# Patient Record
Sex: Female | Born: 1935 | Race: White | Hispanic: No | Marital: Married | Smoking: Former smoker
Health system: Southern US, Community
[De-identification: ages and names within clinical notes are randomized; demographics above are authoritative.]

## PROBLEM LIST (undated history)

## (undated) ENCOUNTER — Emergency Department (HOSPITAL_BASED_OUTPATIENT_CLINIC_OR_DEPARTMENT_OTHER): Admission: EM | Payer: Medicare Other | Source: Home / Self Care

## (undated) DIAGNOSIS — H269 Unspecified cataract: Secondary | ICD-10-CM

## (undated) DIAGNOSIS — E7800 Pure hypercholesterolemia, unspecified: Secondary | ICD-10-CM

## (undated) DIAGNOSIS — C50919 Malignant neoplasm of unspecified site of unspecified female breast: Secondary | ICD-10-CM

## (undated) DIAGNOSIS — M8580 Other specified disorders of bone density and structure, unspecified site: Secondary | ICD-10-CM

## (undated) DIAGNOSIS — D649 Anemia, unspecified: Secondary | ICD-10-CM

## (undated) DIAGNOSIS — K449 Diaphragmatic hernia without obstruction or gangrene: Secondary | ICD-10-CM

## (undated) DIAGNOSIS — E162 Hypoglycemia, unspecified: Secondary | ICD-10-CM

## (undated) DIAGNOSIS — T7840XA Allergy, unspecified, initial encounter: Secondary | ICD-10-CM

## (undated) DIAGNOSIS — R202 Paresthesia of skin: Secondary | ICD-10-CM

## (undated) DIAGNOSIS — F419 Anxiety disorder, unspecified: Secondary | ICD-10-CM

## (undated) DIAGNOSIS — I350 Nonrheumatic aortic (valve) stenosis: Secondary | ICD-10-CM

## (undated) DIAGNOSIS — IMO0002 Reserved for concepts with insufficient information to code with codable children: Secondary | ICD-10-CM

## (undated) DIAGNOSIS — Z923 Personal history of irradiation: Secondary | ICD-10-CM

## (undated) DIAGNOSIS — M1990 Unspecified osteoarthritis, unspecified site: Secondary | ICD-10-CM

## (undated) DIAGNOSIS — K5900 Constipation, unspecified: Secondary | ICD-10-CM

## (undated) DIAGNOSIS — I10 Essential (primary) hypertension: Secondary | ICD-10-CM

## (undated) DIAGNOSIS — I499 Cardiac arrhythmia, unspecified: Secondary | ICD-10-CM

## (undated) HISTORY — PX: BREAST SURGERY: SHX581

## (undated) HISTORY — PX: COSMETIC SURGERY: SHX468

## (undated) HISTORY — DX: Pure hypercholesterolemia, unspecified: E78.00

## (undated) HISTORY — DX: Anxiety disorder, unspecified: F41.9

## (undated) HISTORY — PX: TONSILLECTOMY: SUR1361

## (undated) HISTORY — PX: BREAST LUMPECTOMY: SHX2

## (undated) HISTORY — DX: Anemia, unspecified: D64.9

## (undated) HISTORY — DX: Paresthesia of skin: R20.2

## (undated) HISTORY — PX: PARATHYROIDECTOMY: SHX19

## (undated) HISTORY — DX: Allergy, unspecified, initial encounter: T78.40XA

## (undated) HISTORY — PX: BREAST BIOPSY: SHX20

## (undated) HISTORY — DX: Diaphragmatic hernia without obstruction or gangrene: K44.9

## (undated) HISTORY — DX: Other specified disorders of bone density and structure, unspecified site: M85.80

## (undated) HISTORY — DX: Unspecified cataract: H26.9

## (undated) HISTORY — DX: Nonrheumatic aortic (valve) stenosis: I35.0

## (undated) HISTORY — DX: Malignant neoplasm of unspecified site of unspecified female breast: C50.919

## (undated) HISTORY — DX: Reserved for concepts with insufficient information to code with codable children: IMO0002

---
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
## 1997-12-20 ENCOUNTER — Ambulatory Visit (HOSPITAL_COMMUNITY): Admission: RE | Admit: 1997-12-20 | Discharge: 1997-12-20 | Payer: Self-pay | Admitting: Obstetrics and Gynecology

## 1997-12-27 ENCOUNTER — Other Ambulatory Visit: Admission: RE | Admit: 1997-12-27 | Discharge: 1997-12-27 | Payer: Self-pay | Admitting: Obstetrics and Gynecology

## 1998-12-27 ENCOUNTER — Ambulatory Visit (HOSPITAL_COMMUNITY): Admission: RE | Admit: 1998-12-27 | Discharge: 1998-12-27 | Payer: Self-pay | Admitting: Gastroenterology

## 1998-12-27 ENCOUNTER — Encounter (INDEPENDENT_AMBULATORY_CARE_PROVIDER_SITE_OTHER): Payer: Self-pay | Admitting: Specialist

## 1999-01-02 ENCOUNTER — Ambulatory Visit (HOSPITAL_COMMUNITY): Admission: RE | Admit: 1999-01-02 | Discharge: 1999-01-02 | Payer: Self-pay | Admitting: Obstetrics and Gynecology

## 1999-01-02 ENCOUNTER — Encounter: Payer: Self-pay | Admitting: Obstetrics and Gynecology

## 1999-01-03 ENCOUNTER — Ambulatory Visit (HOSPITAL_COMMUNITY): Admission: RE | Admit: 1999-01-03 | Discharge: 1999-01-03 | Payer: Self-pay | Admitting: Obstetrics and Gynecology

## 1999-01-03 ENCOUNTER — Encounter: Payer: Self-pay | Admitting: Obstetrics and Gynecology

## 1999-01-22 ENCOUNTER — Other Ambulatory Visit: Admission: RE | Admit: 1999-01-22 | Discharge: 1999-01-22 | Payer: Self-pay | Admitting: Obstetrics and Gynecology

## 2000-01-21 ENCOUNTER — Encounter: Payer: Self-pay | Admitting: Obstetrics and Gynecology

## 2000-01-21 ENCOUNTER — Ambulatory Visit (HOSPITAL_COMMUNITY): Admission: RE | Admit: 2000-01-21 | Discharge: 2000-01-21 | Payer: Self-pay | Admitting: Obstetrics and Gynecology

## 2000-02-04 ENCOUNTER — Other Ambulatory Visit: Admission: RE | Admit: 2000-02-04 | Discharge: 2000-02-04 | Payer: Self-pay | Admitting: Neurosurgery

## 2000-10-02 ENCOUNTER — Encounter (INDEPENDENT_AMBULATORY_CARE_PROVIDER_SITE_OTHER): Payer: Self-pay | Admitting: Specialist

## 2000-10-02 ENCOUNTER — Other Ambulatory Visit: Admission: RE | Admit: 2000-10-02 | Discharge: 2000-10-02 | Payer: Self-pay | Admitting: Obstetrics and Gynecology

## 2001-01-29 ENCOUNTER — Encounter: Admission: RE | Admit: 2001-01-29 | Discharge: 2001-01-29 | Payer: Self-pay | Admitting: Obstetrics and Gynecology

## 2001-01-29 ENCOUNTER — Encounter: Payer: Self-pay | Admitting: Obstetrics and Gynecology

## 2001-09-06 ENCOUNTER — Other Ambulatory Visit: Admission: RE | Admit: 2001-09-06 | Discharge: 2001-09-06 | Payer: Self-pay | Admitting: Obstetrics and Gynecology

## 2002-02-02 ENCOUNTER — Encounter: Admission: RE | Admit: 2002-02-02 | Discharge: 2002-02-02 | Payer: Self-pay | Admitting: Obstetrics and Gynecology

## 2002-02-02 ENCOUNTER — Encounter: Payer: Self-pay | Admitting: Obstetrics and Gynecology

## 2002-11-23 ENCOUNTER — Other Ambulatory Visit: Admission: RE | Admit: 2002-11-23 | Discharge: 2002-11-23 | Payer: Self-pay | Admitting: Obstetrics and Gynecology

## 2003-02-07 ENCOUNTER — Encounter: Admission: RE | Admit: 2003-02-07 | Discharge: 2003-02-07 | Payer: Self-pay | Admitting: Obstetrics and Gynecology

## 2003-02-07 ENCOUNTER — Encounter: Payer: Self-pay | Admitting: Obstetrics and Gynecology

## 2004-02-13 ENCOUNTER — Encounter: Admission: RE | Admit: 2004-02-13 | Discharge: 2004-02-13 | Payer: Self-pay | Admitting: Obstetrics and Gynecology

## 2004-02-16 ENCOUNTER — Encounter (INDEPENDENT_AMBULATORY_CARE_PROVIDER_SITE_OTHER): Payer: Self-pay | Admitting: *Deleted

## 2004-02-16 ENCOUNTER — Encounter (INDEPENDENT_AMBULATORY_CARE_PROVIDER_SITE_OTHER): Payer: Self-pay | Admitting: Diagnostic Radiology

## 2004-02-16 ENCOUNTER — Encounter: Admission: RE | Admit: 2004-02-16 | Discharge: 2004-02-16 | Payer: Self-pay | Admitting: Obstetrics and Gynecology

## 2004-02-23 ENCOUNTER — Encounter (HOSPITAL_COMMUNITY): Admission: RE | Admit: 2004-02-23 | Discharge: 2004-05-23 | Payer: Self-pay | Admitting: General Surgery

## 2004-02-29 ENCOUNTER — Ambulatory Visit: Payer: Self-pay | Admitting: Internal Medicine

## 2004-02-29 ENCOUNTER — Encounter: Admission: RE | Admit: 2004-02-29 | Discharge: 2004-02-29 | Payer: Self-pay | Admitting: General Surgery

## 2004-03-04 ENCOUNTER — Encounter: Admission: RE | Admit: 2004-03-04 | Discharge: 2004-03-04 | Payer: Self-pay | Admitting: General Surgery

## 2004-03-05 ENCOUNTER — Ambulatory Visit (HOSPITAL_BASED_OUTPATIENT_CLINIC_OR_DEPARTMENT_OTHER): Admission: RE | Admit: 2004-03-05 | Discharge: 2004-03-05 | Payer: Self-pay | Admitting: General Surgery

## 2004-03-05 ENCOUNTER — Encounter (INDEPENDENT_AMBULATORY_CARE_PROVIDER_SITE_OTHER): Payer: Self-pay | Admitting: *Deleted

## 2004-03-05 ENCOUNTER — Ambulatory Visit (HOSPITAL_COMMUNITY): Admission: RE | Admit: 2004-03-05 | Discharge: 2004-03-05 | Payer: Self-pay | Admitting: General Surgery

## 2004-03-05 ENCOUNTER — Encounter: Admission: RE | Admit: 2004-03-05 | Discharge: 2004-03-05 | Payer: Self-pay | Admitting: General Surgery

## 2004-03-07 ENCOUNTER — Ambulatory Visit: Payer: Self-pay | Admitting: Oncology

## 2004-03-22 ENCOUNTER — Ambulatory Visit (HOSPITAL_COMMUNITY): Admission: RE | Admit: 2004-03-22 | Discharge: 2004-03-22 | Payer: Self-pay | Admitting: Oncology

## 2004-09-04 ENCOUNTER — Encounter: Admission: RE | Admit: 2004-09-04 | Discharge: 2004-09-04 | Payer: Self-pay | Admitting: Internal Medicine

## 2004-09-05 ENCOUNTER — Encounter: Admission: RE | Admit: 2004-09-05 | Discharge: 2004-09-05 | Payer: Self-pay | Admitting: Oncology

## 2004-09-12 ENCOUNTER — Ambulatory Visit: Payer: Self-pay | Admitting: Oncology

## 2004-11-12 ENCOUNTER — Ambulatory Visit: Payer: Self-pay | Admitting: Internal Medicine

## 2004-11-18 ENCOUNTER — Ambulatory Visit: Payer: Self-pay | Admitting: Internal Medicine

## 2004-11-21 ENCOUNTER — Ambulatory Visit (HOSPITAL_COMMUNITY): Admission: RE | Admit: 2004-11-21 | Discharge: 2004-11-21 | Payer: Self-pay | Admitting: Internal Medicine

## 2005-01-09 ENCOUNTER — Ambulatory Visit: Payer: Self-pay | Admitting: Internal Medicine

## 2005-01-23 ENCOUNTER — Ambulatory Visit: Payer: Self-pay | Admitting: Internal Medicine

## 2005-01-29 ENCOUNTER — Ambulatory Visit: Payer: Self-pay | Admitting: Internal Medicine

## 2005-02-04 ENCOUNTER — Other Ambulatory Visit: Admission: RE | Admit: 2005-02-04 | Discharge: 2005-02-04 | Payer: Self-pay | Admitting: Obstetrics and Gynecology

## 2005-02-13 ENCOUNTER — Encounter: Admission: RE | Admit: 2005-02-13 | Discharge: 2005-02-13 | Payer: Self-pay | Admitting: Oncology

## 2005-02-18 ENCOUNTER — Encounter (INDEPENDENT_AMBULATORY_CARE_PROVIDER_SITE_OTHER): Payer: Self-pay | Admitting: *Deleted

## 2005-02-18 ENCOUNTER — Ambulatory Visit (HOSPITAL_COMMUNITY): Admission: RE | Admit: 2005-02-18 | Discharge: 2005-02-19 | Payer: Self-pay | Admitting: Surgery

## 2005-02-21 ENCOUNTER — Ambulatory Visit: Payer: Self-pay | Admitting: Internal Medicine

## 2005-02-27 ENCOUNTER — Ambulatory Visit: Payer: Self-pay | Admitting: Internal Medicine

## 2005-04-28 DIAGNOSIS — C50919 Malignant neoplasm of unspecified site of unspecified female breast: Secondary | ICD-10-CM

## 2005-04-28 HISTORY — DX: Malignant neoplasm of unspecified site of unspecified female breast: C50.919

## 2005-05-09 ENCOUNTER — Ambulatory Visit: Payer: Self-pay | Admitting: Internal Medicine

## 2005-09-01 ENCOUNTER — Ambulatory Visit: Payer: Self-pay | Admitting: Internal Medicine

## 2005-10-06 ENCOUNTER — Ambulatory Visit: Payer: Self-pay | Admitting: Oncology

## 2005-10-09 LAB — CBC WITH DIFFERENTIAL/PLATELET
BASO%: 0.5 % (ref 0.0–2.0)
Basophils Absolute: 0 10*3/uL (ref 0.0–0.1)
EOS%: 1.2 % (ref 0.0–7.0)
HGB: 13.9 g/dL (ref 11.6–15.9)
MCH: 31.9 pg (ref 26.0–34.0)
MCHC: 33.4 g/dL (ref 32.0–36.0)
MCV: 95.5 fL (ref 81.0–101.0)
MONO%: 6.2 % (ref 0.0–13.0)
RDW: 13.2 % (ref 11.3–14.5)
lymph#: 1.8 10*3/uL (ref 0.9–3.3)

## 2005-10-10 LAB — CMP AND LIVER
AST: 23 U/L (ref 0–37)
BUN: 23 mg/dL (ref 6–23)
Bilirubin, Direct: 0.1 mg/dL (ref 0.0–0.3)
CO2: 27 mEq/L (ref 19–32)
Calcium: 9 mg/dL (ref 8.4–10.5)
Chloride: 104 mEq/L (ref 96–112)
Creatinine, Ser: 0.87 mg/dL (ref 0.40–1.20)
Total Bilirubin: 0.5 mg/dL (ref 0.3–1.2)

## 2005-10-10 LAB — LACTATE DEHYDROGENASE: LDH: 185 U/L (ref 94–250)

## 2005-11-10 ENCOUNTER — Ambulatory Visit: Payer: Self-pay | Admitting: Internal Medicine

## 2005-11-19 ENCOUNTER — Ambulatory Visit: Payer: Self-pay | Admitting: Internal Medicine

## 2006-02-09 ENCOUNTER — Ambulatory Visit: Payer: Self-pay | Admitting: Internal Medicine

## 2006-02-16 ENCOUNTER — Encounter: Admission: RE | Admit: 2006-02-16 | Discharge: 2006-02-16 | Payer: Self-pay | Admitting: Oncology

## 2006-02-16 ENCOUNTER — Encounter: Admission: RE | Admit: 2006-02-16 | Discharge: 2006-02-16 | Payer: Self-pay | Admitting: Pediatrics

## 2006-09-22 ENCOUNTER — Ambulatory Visit: Payer: Self-pay | Admitting: Oncology

## 2006-09-24 LAB — CBC WITH DIFFERENTIAL/PLATELET
Eosinophils Absolute: 0.1 10*3/uL (ref 0.0–0.5)
HGB: 14.6 g/dL (ref 11.6–15.9)
MONO#: 0.5 10*3/uL (ref 0.1–0.9)
NEUT#: 3 10*3/uL (ref 1.5–6.5)
RBC: 4.52 10*6/uL (ref 3.70–5.32)
RDW: 13.6 % (ref 11.3–14.5)
WBC: 5.4 10*3/uL (ref 3.9–10.0)

## 2006-09-24 LAB — COMPREHENSIVE METABOLIC PANEL
ALT: 15 U/L (ref 0–35)
AST: 18 U/L (ref 0–37)
Calcium: 9.5 mg/dL (ref 8.4–10.5)
Chloride: 104 mEq/L (ref 96–112)
Creatinine, Ser: 0.87 mg/dL (ref 0.40–1.20)
Potassium: 4 mEq/L (ref 3.5–5.3)
Sodium: 142 mEq/L (ref 135–145)
Total Protein: 6.5 g/dL (ref 6.0–8.3)

## 2006-10-29 DIAGNOSIS — Z853 Personal history of malignant neoplasm of breast: Secondary | ICD-10-CM | POA: Insufficient documentation

## 2006-10-29 DIAGNOSIS — I059 Rheumatic mitral valve disease, unspecified: Secondary | ICD-10-CM

## 2006-10-29 DIAGNOSIS — M8580 Other specified disorders of bone density and structure, unspecified site: Secondary | ICD-10-CM

## 2006-10-29 DIAGNOSIS — M810 Age-related osteoporosis without current pathological fracture: Secondary | ICD-10-CM | POA: Insufficient documentation

## 2006-11-03 ENCOUNTER — Telehealth: Payer: Self-pay | Admitting: *Deleted

## 2006-11-05 ENCOUNTER — Ambulatory Visit: Payer: Self-pay | Admitting: Oncology

## 2006-11-09 ENCOUNTER — Encounter: Payer: Self-pay | Admitting: Internal Medicine

## 2006-11-20 ENCOUNTER — Ambulatory Visit: Payer: Self-pay | Admitting: Internal Medicine

## 2006-11-20 DIAGNOSIS — E161 Other hypoglycemia: Secondary | ICD-10-CM

## 2006-11-20 LAB — CONVERTED CEMR LAB
Basophils Absolute: 0 10*3/uL (ref 0.0–0.1)
Calcium: 9 mg/dL (ref 8.4–10.5)
Chloride: 107 meq/L (ref 96–112)
Cholesterol: 250 mg/dL (ref 0–200)
Direct LDL: 142.4 mg/dL
Eosinophils Relative: 2.1 % (ref 0.0–5.0)
HCT: 43.1 % (ref 36.0–46.0)
MCHC: 33.9 g/dL (ref 30.0–36.0)
Monocytes Absolute: 0.4 10*3/uL (ref 0.2–0.7)
Neutrophils Relative %: 57.4 % (ref 43.0–77.0)
Potassium: 4.2 meq/L (ref 3.5–5.1)
RBC: 4.54 M/uL (ref 3.87–5.11)
RDW: 12.1 % (ref 11.5–14.6)
Sodium: 144 meq/L (ref 135–145)
TSH: 1.16 microintl units/mL (ref 0.35–5.50)

## 2006-11-30 ENCOUNTER — Ambulatory Visit: Payer: Self-pay | Admitting: Internal Medicine

## 2006-11-30 DIAGNOSIS — E785 Hyperlipidemia, unspecified: Secondary | ICD-10-CM | POA: Insufficient documentation

## 2006-12-21 ENCOUNTER — Telehealth: Payer: Self-pay | Admitting: Internal Medicine

## 2006-12-23 ENCOUNTER — Telehealth: Payer: Self-pay | Admitting: Internal Medicine

## 2007-01-27 ENCOUNTER — Ambulatory Visit: Payer: Self-pay | Admitting: Internal Medicine

## 2007-01-27 LAB — CONVERTED CEMR LAB
Cholesterol: 203 mg/dL (ref 0–200)
Direct LDL: 109.5 mg/dL
Total CHOL/HDL Ratio: 2.7
VLDL: 16 mg/dL (ref 0–40)

## 2007-01-28 ENCOUNTER — Encounter: Payer: Self-pay | Admitting: Internal Medicine

## 2007-02-02 ENCOUNTER — Ambulatory Visit: Payer: Self-pay | Admitting: Internal Medicine

## 2007-02-02 LAB — CONVERTED CEMR LAB: HDL goal, serum: 40 mg/dL

## 2007-02-09 ENCOUNTER — Other Ambulatory Visit: Admission: RE | Admit: 2007-02-09 | Discharge: 2007-02-09 | Payer: Self-pay | Admitting: Obstetrics and Gynecology

## 2007-02-19 ENCOUNTER — Encounter: Admission: RE | Admit: 2007-02-19 | Discharge: 2007-02-19 | Payer: Self-pay | Admitting: Oncology

## 2007-05-20 ENCOUNTER — Encounter: Payer: Self-pay | Admitting: Internal Medicine

## 2007-09-02 ENCOUNTER — Telehealth: Payer: Self-pay | Admitting: Internal Medicine

## 2007-09-02 ENCOUNTER — Ambulatory Visit: Payer: Self-pay | Admitting: Internal Medicine

## 2007-09-16 ENCOUNTER — Encounter: Payer: Self-pay | Admitting: Internal Medicine

## 2007-09-16 ENCOUNTER — Telehealth: Payer: Self-pay | Admitting: Internal Medicine

## 2007-09-24 ENCOUNTER — Ambulatory Visit: Payer: Self-pay | Admitting: Oncology

## 2007-10-04 ENCOUNTER — Ambulatory Visit: Payer: Self-pay | Admitting: Internal Medicine

## 2007-10-05 ENCOUNTER — Telehealth: Payer: Self-pay | Admitting: Internal Medicine

## 2007-10-19 ENCOUNTER — Ambulatory Visit: Payer: Self-pay | Admitting: Internal Medicine

## 2007-11-01 ENCOUNTER — Telehealth: Payer: Self-pay | Admitting: Internal Medicine

## 2007-11-03 LAB — CBC WITH DIFFERENTIAL/PLATELET
Basophils Absolute: 0 10*3/uL (ref 0.0–0.1)
Eosinophils Absolute: 0.1 10*3/uL (ref 0.0–0.5)
HCT: 42 % (ref 34.8–46.6)
HGB: 14.4 g/dL (ref 11.6–15.9)
LYMPH%: 36.3 % (ref 14.0–48.0)
MCV: 93.2 fL (ref 81.0–101.0)
MONO%: 4.4 % (ref 0.0–13.0)
NEUT#: 2.8 10*3/uL (ref 1.5–6.5)
Platelets: 157 10*3/uL (ref 145–400)

## 2007-11-04 LAB — LACTATE DEHYDROGENASE: LDH: 164 U/L (ref 94–250)

## 2007-11-04 LAB — COMPREHENSIVE METABOLIC PANEL
CO2: 25 mEq/L (ref 19–32)
Calcium: 9.1 mg/dL (ref 8.4–10.5)
Creatinine, Ser: 0.94 mg/dL (ref 0.40–1.20)
Glucose, Bld: 161 mg/dL — ABNORMAL HIGH (ref 70–99)
Total Bilirubin: 0.6 mg/dL (ref 0.3–1.2)
Total Protein: 6.5 g/dL (ref 6.0–8.3)

## 2007-11-04 LAB — VITAMIN D 25 HYDROXY (VIT D DEFICIENCY, FRACTURES): Vit D, 25-Hydroxy: 38 ng/mL (ref 30–89)

## 2007-11-09 ENCOUNTER — Ambulatory Visit: Payer: Self-pay | Admitting: Oncology

## 2007-11-11 ENCOUNTER — Encounter: Payer: Self-pay | Admitting: Internal Medicine

## 2007-11-19 ENCOUNTER — Other Ambulatory Visit: Admission: RE | Admit: 2007-11-19 | Discharge: 2007-11-19 | Payer: Self-pay | Admitting: Obstetrics and Gynecology

## 2007-11-22 ENCOUNTER — Ambulatory Visit: Payer: Self-pay | Admitting: Internal Medicine

## 2007-11-22 DIAGNOSIS — N390 Urinary tract infection, site not specified: Secondary | ICD-10-CM

## 2007-11-22 LAB — CONVERTED CEMR LAB
BUN: 21 mg/dL (ref 6–23)
Basophils Absolute: 0 10*3/uL (ref 0.0–0.1)
Bilirubin, Direct: 0.1 mg/dL (ref 0.0–0.3)
CEA: 0.9 ng/mL (ref 0.0–5.0)
Calcium: 9.1 mg/dL (ref 8.4–10.5)
Cholesterol: 227 mg/dL (ref 0–200)
Direct LDL: 122.2 mg/dL
Eosinophils Relative: 1.9 % (ref 0.0–5.0)
GFR calc Af Amer: 91 mL/min
HCT: 42.1 % (ref 36.0–46.0)
HDL: 79.2 mg/dL (ref >39.0)
Hemoglobin: 14.2 g/dL (ref 12.0–15.0)
Lymphocytes Relative: 38 % (ref 12.0–46.0)
MCHC: 33.7 g/dL (ref 30.0–36.0)
MCV: 97 fL (ref 78.0–100.0)
Monocytes Absolute: 0.4 10*3/uL (ref 0.1–1.0)
Monocytes Relative: 7.4 % (ref 3.0–12.0)
Neutrophils Relative %: 51.9 % (ref 43.0–77.0)
Potassium: 4.3 meq/L (ref 3.5–5.1)
RBC: 4.34 M/uL (ref 3.87–5.11)
Total Bilirubin: 0.7 mg/dL (ref 0.3–1.2)
Total Protein: 6.4 g/dL (ref 6.0–8.3)
Triglycerides: 88 mg/dL (ref 0–149)
VLDL: 18 mg/dL (ref 0–40)

## 2007-11-24 ENCOUNTER — Telehealth: Payer: Self-pay | Admitting: Internal Medicine

## 2007-11-25 LAB — CONVERTED CEMR LAB: Vit D, 1,25-Dihydroxy: 45 (ref 30–89)

## 2007-12-21 ENCOUNTER — Telehealth: Payer: Self-pay | Admitting: Internal Medicine

## 2007-12-27 ENCOUNTER — Ambulatory Visit: Payer: Self-pay | Admitting: Internal Medicine

## 2008-02-08 ENCOUNTER — Ambulatory Visit: Payer: Self-pay | Admitting: Internal Medicine

## 2008-02-21 ENCOUNTER — Ambulatory Visit: Payer: Self-pay | Admitting: Internal Medicine

## 2008-02-21 ENCOUNTER — Encounter: Admission: RE | Admit: 2008-02-21 | Discharge: 2008-02-21 | Payer: Self-pay | Admitting: Oncology

## 2008-02-21 DIAGNOSIS — J0100 Acute maxillary sinusitis, unspecified: Secondary | ICD-10-CM

## 2008-02-22 ENCOUNTER — Encounter: Admission: RE | Admit: 2008-02-22 | Discharge: 2008-02-22 | Payer: Self-pay | Admitting: Oncology

## 2008-03-06 ENCOUNTER — Telehealth: Payer: Self-pay | Admitting: Internal Medicine

## 2008-09-28 ENCOUNTER — Telehealth: Payer: Self-pay | Admitting: Internal Medicine

## 2008-11-01 ENCOUNTER — Ambulatory Visit: Payer: Self-pay | Admitting: Oncology

## 2008-11-08 LAB — CBC WITH DIFFERENTIAL/PLATELET
BASO%: 0.5 % (ref 0.0–2.0)
LYMPH%: 37.5 % (ref 14.0–49.7)
MCHC: 33.8 g/dL (ref 31.5–36.0)
MONO#: 0.4 10*3/uL (ref 0.1–0.9)
MONO%: 7.1 % (ref 0.0–14.0)
Platelets: 145 10*3/uL (ref 145–400)
RBC: 4.18 10*6/uL (ref 3.70–5.45)
WBC: 5.5 10*3/uL (ref 3.9–10.3)

## 2008-11-09 LAB — COMPREHENSIVE METABOLIC PANEL
ALT: 14 U/L (ref 0–35)
Alkaline Phosphatase: 59 U/L (ref 39–117)
CO2: 24 mEq/L (ref 19–32)
Sodium: 145 mEq/L (ref 135–145)
Total Bilirubin: 0.6 mg/dL (ref 0.3–1.2)
Total Protein: 5.9 g/dL — ABNORMAL LOW (ref 6.0–8.3)

## 2008-11-23 ENCOUNTER — Ambulatory Visit: Payer: Self-pay | Admitting: Internal Medicine

## 2008-11-23 LAB — HM MAMMOGRAPHY

## 2008-11-28 ENCOUNTER — Encounter: Payer: Self-pay | Admitting: Internal Medicine

## 2008-11-28 LAB — CONVERTED CEMR LAB: Vit D, 25-Hydroxy: 41 ng/mL (ref 30–89)

## 2008-12-04 LAB — CONVERTED CEMR LAB
ALT: 16 units/L (ref 0–35)
AST: 22 units/L (ref 0–37)
BUN: 17 mg/dL (ref 6–23)
Basophils Absolute: 0 10*3/uL (ref 0.0–0.1)
Basophils Relative: 0.7 % (ref 0.0–3.0)
CO2: 30 meq/L (ref 19–32)
Creatinine, Ser: 0.8 mg/dL (ref 0.4–1.2)
Direct LDL: 122.1 mg/dL
Eosinophils Absolute: 0.1 10*3/uL (ref 0.0–0.7)
GFR calc non Af Amer: 74.57 mL/min (ref >60)
Glucose, Bld: 91 mg/dL (ref 70–99)
Monocytes Relative: 8 % (ref 3.0–12.0)
Neutro Abs: 2.8 10*3/uL (ref 1.4–7.7)
Neutrophils Relative %: 47.8 % (ref 43.0–77.0)
Potassium: 4.5 meq/L (ref 3.5–5.1)
RBC: 4.08 M/uL (ref 3.87–5.11)
Sodium: 148 meq/L — ABNORMAL HIGH (ref 135–145)
Total Protein: 6.1 g/dL (ref 6.0–8.3)
WBC: 5.6 10*3/uL (ref 4.5–10.5)

## 2008-12-08 ENCOUNTER — Ambulatory Visit: Payer: Self-pay | Admitting: Oncology

## 2008-12-12 ENCOUNTER — Encounter: Payer: Self-pay | Admitting: Internal Medicine

## 2008-12-25 ENCOUNTER — Ambulatory Visit: Payer: Self-pay | Admitting: Internal Medicine

## 2009-02-09 ENCOUNTER — Ambulatory Visit: Payer: Self-pay | Admitting: Internal Medicine

## 2009-02-16 ENCOUNTER — Ambulatory Visit: Payer: Self-pay | Admitting: Oncology

## 2009-02-21 ENCOUNTER — Encounter: Admission: RE | Admit: 2009-02-21 | Discharge: 2009-02-21 | Payer: Self-pay | Admitting: Oncology

## 2009-07-09 ENCOUNTER — Encounter: Payer: Self-pay | Admitting: Internal Medicine

## 2009-07-18 ENCOUNTER — Telehealth: Payer: Self-pay | Admitting: Internal Medicine

## 2009-07-20 ENCOUNTER — Ambulatory Visit: Payer: Self-pay | Admitting: Internal Medicine

## 2009-07-20 DIAGNOSIS — J320 Chronic maxillary sinusitis: Secondary | ICD-10-CM | POA: Insufficient documentation

## 2009-09-10 ENCOUNTER — Ambulatory Visit: Payer: Self-pay | Admitting: Internal Medicine

## 2009-09-10 DIAGNOSIS — J309 Allergic rhinitis, unspecified: Secondary | ICD-10-CM

## 2009-09-17 ENCOUNTER — Ambulatory Visit: Payer: Self-pay | Admitting: Oncology

## 2009-09-18 ENCOUNTER — Encounter: Payer: Self-pay | Admitting: Internal Medicine

## 2009-09-18 LAB — CBC WITH DIFFERENTIAL/PLATELET
HCT: 42.3 % (ref 34.8–46.6)
MCH: 31.7 pg (ref 25.1–34.0)
MONO#: 0.4 10*3/uL (ref 0.1–0.9)
RBC: 4.49 10*6/uL (ref 3.70–5.45)
RDW: 13.3 % (ref 11.2–14.5)

## 2009-09-18 LAB — COMPREHENSIVE METABOLIC PANEL
AST: 28 U/L (ref 0–37)
BUN: 9 mg/dL (ref 6–23)
Calcium: 9.1 mg/dL (ref 8.4–10.5)
Chloride: 103 mEq/L (ref 96–112)
Creatinine, Ser: 0.8 mg/dL (ref 0.40–1.20)
Potassium: 3.7 mEq/L (ref 3.5–5.3)
Sodium: 140 mEq/L (ref 135–145)
Total Bilirubin: 0.7 mg/dL (ref 0.3–1.2)

## 2009-09-18 LAB — LACTATE DEHYDROGENASE: LDH: 164 U/L (ref 94–250)

## 2009-10-11 ENCOUNTER — Ambulatory Visit: Payer: Self-pay | Admitting: Internal Medicine

## 2009-11-09 ENCOUNTER — Ambulatory Visit: Payer: Self-pay | Admitting: Internal Medicine

## 2009-11-09 LAB — HM COLONOSCOPY

## 2009-11-19 LAB — CONVERTED CEMR LAB
LDL Cholesterol: 182 mg/dL — ABNORMAL HIGH (ref 0–99)
VLDL: 20 mg/dL (ref 0–40)

## 2010-01-16 ENCOUNTER — Ambulatory Visit: Payer: Self-pay | Admitting: Internal Medicine

## 2010-01-28 ENCOUNTER — Encounter: Admission: RE | Admit: 2010-01-28 | Discharge: 2010-01-28 | Payer: Self-pay | Admitting: Oncology

## 2010-03-04 ENCOUNTER — Encounter: Payer: Self-pay | Admitting: Internal Medicine

## 2010-04-28 DIAGNOSIS — IMO0002 Reserved for concepts with insufficient information to code with codable children: Secondary | ICD-10-CM

## 2010-04-28 HISTORY — DX: Reserved for concepts with insufficient information to code with codable children: IMO0002

## 2010-05-19 ENCOUNTER — Encounter: Payer: Self-pay | Admitting: Internal Medicine

## 2010-05-19 ENCOUNTER — Encounter: Payer: Self-pay | Admitting: Oncology

## 2010-05-30 NOTE — Assessment & Plan Note (Signed)
Summary: cough/bmw   Vital Signs:  Patient profile:   77 year old female Height:      66 inches Weight:      164 pounds BMI:     26.57 Temp:     98.2 degrees F oral Pulse rate:   76 / minute Resp:     14 per minute BP sitting:   136 / 80  (left arm)  Vitals Entered By: Willy Eddy, LPN (July 20, 2009 12:20 PM) CC: C/O HAVING SINUS CONGESTION FOR ABOUT 2-3 MONTHS ,THEN STARTED WITH LOSSE COUGH ABOUT 1 WEEK AGO   CC:  C/O HAVING SINUS CONGESTION FOR ABOUT 2-3 MONTHS  and THEN STARTED WITH LOSSE COUGH ABOUT 1 WEEK AGO.  History of Present Illness: had had a "chronic" sinus infection for most of the winter has a tight hacking cough cough after wine? 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) 8)  Osteoporosis  (ICD-733.00) 9)  Breast Cancer, Hx of  (ICD-V10.3) 10)  Mitral Valve Prolapse  (ICD-424.0)  Current Problems (verified): 1)  Acute Maxillary Sinusitis  (ICD-461.0) 2)  Uti  (ICD-599.0) 3)  Epistaxis  (ICD-784.7) 4)  Hypercholesterolemia, Borderline  (ICD-272.4) 5)  Advef, Drug/medicinal/biological Subst Nos  (ICD-995.20) 6)  Hypoglycemia Nec  (ICD-251.1) 7)  Disorders, 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 .qd 2)  Calcium 600/vitamin D 600-200 Mg-Unit  Tabs (Calcium Carbonate-Vitamin D) .... One Twice A Day 3)  Chloral  Hydrate 500 Mg/108ml  Syrp (Chloral Hydrate) .... Two Tsp Op Ghs As Needed Sleep 4)  Red Yeast Rice 600 Mg  Caps (Red Yeast Rice Extract) .Marland Kitchen.. 1 Once Daily 5)  Fareston 60 Mg Tabs (Toremifene Citrate) .Marland Kitchen.. 1 Once Daily 6)  Magnesium 300 Mg Caps (Magnesium) .Marland Kitchen.. 1 Once Daily  Current Medications (verified): 1)  Therapeutic Multivitamin   Tabs (Multiple Vitamin) .... .qd 2)  Calcium 600/vitamin D 600-200 Mg-Unit  Tabs (Calcium Carbonate-Vitamin D) .... One Twice A Day 3)  Chloral Hydrate 500 Mg/73ml  Syrp (Chloral Hydrate) .... Two Tsp Op Ghs As Needed Sleep 4)  Red Yeast Rice 600 Mg  Caps (Red Yeast Rice Extract) .Marland Kitchen.. 1 Once Daily 5)  Femara 2.5 Mg Tabs (Letrozole) .... Use As Directed 6)  Magnesium 300 Mg Caps (Magnesium) .Marland Kitchen.. 1 Once Daily  Allergies (verified): 1)  ! Sulfa 2)  ! Augmentin  Past History:  Family History: Last updated: 11/20/2006 Family History Hypertension mother  Fam hx Renal failure father mother died at 4 of old age  Social History: Last updated: 10/29/2006 Married Former Smoker  Risk Factors: Smoking Status: quit (07/20/2009)  Past medical, surgical, family and social histories (including risk factors) reviewed, and no changes noted (except as noted below).  Past Medical History: Reviewed history from 11/20/2006 and no changes required. Breast cancer, hx of right Osteoporosis  Past Surgical History: Reviewed history from 11/20/2006  and no changes required. Colonoscopy-07/05/2002 Parathyroidectomy-02/18/2005 plastic sx-face breast surgery Lumpectomy 2005 Tonsillectomy  Family History: Reviewed history from 11/20/2006 and no changes required. Family History Hypertension mother  Fam hx Renal failure father mother died at 52 of old age  Social History: Reviewed history from 10/29/2006 and no changes required. Married Former Smoker  Review of Systems  The patient denies anorexia, fever, weight loss, weight gain, vision loss, decreased hearing,  hoarseness, chest pain, syncope, dyspnea on exertion, peripheral edema, prolonged cough, headaches, hemoptysis, abdominal pain, melena, hematochezia, severe indigestion/heartburn, hematuria, incontinence, genital sores, muscle weakness, suspicious skin lesions, transient blindness, difficulty walking, depression, unusual weight change, abnormal bleeding, enlarged lymph nodes, angioedema, and breast masses.    Physical Exam  General:  alert and well-developed.   Head:  normocephalic and atraumatic.   Eyes:  pupils equal, pupils round, and pupils reactive to light.   Ears:  R ear normal, L ear normal, and no external deformities.   Nose:  nasal dischargemucosal pallor, mucosal erythema, mucosal edema, and airflow obstruction.   Mouth:  pharynx pink and moist.  posterior lymphoid hypertrophy and postnasal drip.   Neck:  No deformities, masses, or tenderness noted. Lungs:  Normal respiratory effort, chest expands symmetrically. Lungs are clear to auscultation, no crackles or wheezes. Heart:  Normal rate and regular rhythm. S1 and S2 normal without gallop, murmur, click, rub or other extra sounds. Abdomen:  soft, non-tender, and normal bowel sounds.   Msk:  no joint tenderness and no joint swelling.   Pulses:  R and L carotid,radial,femoral,dorsalis pedis and posterior tibial pulses are full and equal bilaterally Extremities:  No clubbing, cyanosis, edema, or deformity noted with normal full range of motion of all joints.   Neurologic:  alert & oriented X3.     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 Discussed treatment options including indications for coronal CT scan of sinuses and ENT referral.   Her updated medication list for this problem includes:    Clarithromycin 500 Mg Xr24h-tab (Clarithromycin) ..... One by mouth two times a day for 14 days ( hold the red rice yeast)    Allerx-d  120-2.5 Mg Xr12h-tab (Pseudoephedrine-methscopolamin) ..... One by mouth two times a day for 14 days  Complete Medication List: 1)  Therapeutic Multivitamin Tabs (Multiple vitamin) .... .qd 2)  Calcium 600/vitamin D 600-200 Mg-unit Tabs (Calcium carbonate-vitamin d) .... One twice a day 3)  Chloral Hydrate 500 Mg/22ml Syrp (Chloral hydrate) .... Two tsp op ghs as needed sleep 4)  Red Yeast Rice 600 Mg Caps (Red yeast rice extract) .Marland Kitchen.. 1 once daily 5)  Femara 2.5 Mg Tabs (Letrozole) .... Use as directed 6)  Magnesium 300 Mg Caps (Magnesium) .Marland Kitchen.. 1 once daily 7)  Clarithromycin 500 Mg Xr24h-tab (Clarithromycin) .... One by mouth two times a day for 14 days ( hold the red rice yeast) 8)  Allerx-d 120-2.5 Mg Xr12h-tab (Pseudoephedrine-methscopolamin) .... One by mouth two times a day for 14 days  Patient Instructions: 1)  Take your antibiotic as prescribed until ALL of it is gone, but stop if you develop a rash or swelling and contact our office as soon as possible. Prescriptions: ALLERX-D 120-2.5 MG XR12H-TAB (PSEUDOEPHEDRINE-METHSCOPOLAMIN) one by mouth two times a day for 14 days  #28 x 0   Entered and Authorized by:   Stacie Glaze MD   Signed by:   Stacie Glaze MD on 07/20/2009  Method used:   Electronically to        [facility] # [phone]* (retail)       [address] [place], [place]  [address]       Ph: [phone]       Fax: [phone]   RxID:   [id number] CLARITHROMYCIN 500 MG XR24H-TAB (CLARITHROMYCIN) one by mouth two times a day for 14 days ( hold the red rice yeast)  #28 x 0   Entered and Authorized by:   Stacie Glaze MD   Signed by:   Stacie Glaze MD on 07/20/2009   Method used:   Electronically to        [facility] # [phone]* (retail)       [address] [place], [place]  [address]       Ph: [phone]       Fax: [phone]   RxID:   [id number]

## 2010-05-30 NOTE — Letter (Signed)
Summary: [facility]  [facility]   Imported By: Maryln Gottron 07/17/2009 09:45:38  _____________________________________________________________________  External Attachment:    Type:   Image     Comment:   External Document

## 2010-05-30 NOTE — Assessment & Plan Note (Signed)
Summary: flu shot//ccm  Nurse Visit   Vitals Entered By: Duard Brady LPN (January 16, 2010 3:04 PM)  Allergies: 1)  ! Sulfa 2)  ! Augmentin  Orders Added: 1)  Flu Vaccine 3yrs + MEDICARE PATIENTS [Q2039] 2)  Administration Flu vaccine - MCR [G0008] Flu Vaccine Consent Questions     Do you have a history of severe allergic reactions to this vaccine? no    Any prior history of allergic reactions to egg and/or gelatin? no    Do you have a sensitivity to the preservative Thimersol? no    Do you have a past history of Guillan-Barre Syndrome? no    Do you currently have an acute febrile illness? no    Have you ever had a severe reaction to latex? no    Vaccine information given and explained to patient? yes    Are you currently pregnant? no    Lot Number:[id number]   Exp Date:10/26/2010   Site Given  Left Deltoid [email or web address]

## 2010-05-30 NOTE — Assessment & Plan Note (Signed)
Summary: 1 month rov/njr   Vital Signs:  Patient profile:   77 year old female Height:      66 inches Weight:      160 pounds BMI:     25.92 Temp:     98.2 degrees F oral Pulse rate:   72 / minute Resp:     14 per minute BP sitting:   120 / 74  (left arm)  Vitals Entered By: Willy Eddy, LPN (October 11, 2009 12:16 PM) CC: roa, URI symptoms   CC:  roa and URI symptoms.  History of Present Illness: brings labs from cancer center for CPX   URI Symptoms      This is a right-handed patient who presents with URI symptoms.  The patient reports nasal congestion.  The patient denies fever, low-grade fever (<100.5 degrees), fever of 100.5-103 degrees, fever of 103.1-104 degrees, fever to >104 degrees, stiff neck, dyspnea, wheezing, rash, vomiting, diarrhea, use of an antipyretic, and response to antipyretic.  The patient also reports itchy watery eyes, itchy throat, seasonal symptoms, and headache.  The patient denies the following risk factors for Strep sinusitis: unilateral facial pain, unilateral nasal discharge, poor response to decongestant, double sickening, tooth pain, Strep exposure, tender adenopathy, and absence of cough.    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 .qd 2)  Calcium 600/vitamin D 600-200 Mg-Unit  Tabs (Calcium Carbonate-Vitamin D) .... One Twice A Day 3)  Chloral Hydrate 500 Mg/59ml  Syrp (Chloral Hydrate) .... Two Tsp Op Ghs As Needed Sleep 4)  Red Yeast Rice 600 Mg  Caps (Red Yeast Rice Extract) .Marland Kitchen.. 1 Once Daily 5)  Femara 2.5 Mg Tabs (Letrozole) .... Use As Directed 6)  Magnesium 300 Mg Caps (Magnesium) .Marland Kitchen.. 1 Once Daily 7)  Clarithromycin 500 Mg Xr24h-Tab (Clarithromycin) .... One By Mouth Two Times A Day For 14 Days ( Hold The Red Rice Yeast) 8)  Allerx-D 120-2.5 Mg Xr12h-Tab (Pseudoephedrine-Methscopolamin) .... One By Mouth Two Times A Day For 14 Days 9)  Fexofenadine-Pseudoephedrine 60-120 Mg Xr12h-Tab (Fexofenadine-Pseudoephedrine) .... One By Mouth Two Times A Day 10)  Nasonex 50 Mcg/act Susp (Mometasone Furoate) .... Two Spray Bin Nare Two Times A Day '  Current Medications (verified): 1)  Therapeutic Multivitamin   Tabs (Multiple Vitamin) .... .qd 2)  Calcium 600/vitamin D 600-200 Mg-Unit  Tabs (Calcium Carbonate-Vitamin D) .Marland KitchenMarland KitchenMarland Kitchen  One Twice A Day 3)  Chloral Hydrate 500 Mg/17ml  Syrp (Chloral Hydrate) .... Two Tsp Op Ghs As Needed Sleep 4)  Red Yeast Rice 600 Mg  Caps (Red Yeast Rice Extract) .Marland Kitchen.. 1 Once Daily 5)  Femara 2.5 Mg Tabs (Letrozole) .... Use As Directed 6)  Magnesium 300 Mg Caps (Magnesium) .Marland Kitchen.. 1 Once Daily 7)  Fexofenadine-Pseudoephedrine 60-120 Mg Xr12h-Tab (Fexofenadine-Pseudoephedrine) .... One By Mouth Two Times A Day 8)  Nasonex  50 Mcg/act Susp (Mometasone Furoate) .... Two Spray Bin Nare Two Times A Day '  Allergies (verified): 1)  ! Sulfa 2)  ! Augmentin  Past History:  Family History: Last updated: 11/20/2006 Family History Hypertension mother  Fam hx Renal failure father mother died at 59 of old age  Social History: Last updated: 10/29/2006 Married Former Smoker  Risk Factors: Smoking Status: quit (10/11/2009)  Past medical, surgical, family and social histories (including risk factors) reviewed, and no changes noted (except as noted below).  Past Medical History: Reviewed history from 11/20/2006 and no changes required. Breast cancer, hx of right Osteoporosis  Past Surgical History: Reviewed history from 11/20/2006 and no changes required. Colonoscopy-07/05/2002 Parathyroidectomy-02/18/2005 plastic sx-face breast surgery Lumpectomy 2005 Tonsillectomy  Family History: Reviewed history from 11/20/2006 and no changes required. Family History Hypertension mother  Fam hx Renal failure father mother died at 84 of old age  Social History: Reviewed history from 10/29/2006 and no changes required. Married Former Smoker  Review of Systems  The patient denies anorexia, fever, weight loss, weight gain, vision loss, decreased hearing, hoarseness, chest pain, syncope, dyspnea on exertion, peripheral edema, prolonged cough, headaches, hemoptysis, abdominal pain, melena, hematochezia, severe indigestion/heartburn, hematuria, incontinence, genital sores, muscle weakness, suspicious skin lesions, transient blindness, difficulty walking, depression, unusual weight change, abnormal bleeding, enlarged lymph nodes, angioedema, breast masses, and testicular masses.    Physical Exam  General:  alert and well-developed.   Head:  normocephalic.   Eyes:  pupils equal, pupils round, conjunctival injection, and excessive tearing.   Ears:  R ear normal and L ear normal.   Nose:  nasal dischargemucosal pallor,  mucosal edema, and airflow obstruction.   Mouth:  pharynx pink and moist.  posterior lymphoid hypertrophy and postnasal drip.   Neck:  No deformities, masses, or tenderness noted. Lungs:  Normal respiratory effort, chest expands symmetrically. Lungs are clear to auscultation, no crackles or wheezes. Heart:  Normal rate and regular rhythm. S1 and S2 normal without gallop, murmur, click, rub or other extra sounds.   Impression & Recommendations:  Problem # 1:  ALLERGIC RHINITIS DUE TO OTHER ALLERGEN (ICD-477.8) omnaris to the nose.... samples given  Complete Medication List: 1)  Therapeutic Multivitamin Tabs (Multiple vitamin) .... .qd 2)  Calcium 600/vitamin D 600-200 Mg-unit Tabs (Calcium carbonate-vitamin d) .... One twice a day 3)  Chloral Hydrate 500 Mg/29ml Syrp (Chloral hydrate) .... Two tsp op ghs as needed sleep 4)  Red Yeast Rice 600 Mg Caps (Red yeast rice extract) .Marland Kitchen.. 1 once daily 5)  Femara 2.5 Mg Tabs (Letrozole) .... Use as directed 6)  Magnesium 300 Mg Caps (Magnesium) .Marland Kitchen.. 1 once daily 7)  Fexofenadine-pseudoephedrine 60-120 Mg Xr12h-tab (Fexofenadine-pseudoephedrine) .... One by mouth two times a day 8)  Nasonex 50 Mcg/act Susp (Mometasone furoate) .... Two spray bin nare two times a day '  Patient Instructions: 1)  two spray of omnaris twice a day

## 2010-05-30 NOTE — Assessment & Plan Note (Signed)
Summary: 9 month rov/njr   Vital Signs:  Patient profile:   77 year old female Height:      66 inches Weight:      160 pounds BMI:     25.92 Temp:     98.1 degrees F oral Pulse rate:   76 / minute Resp:     14 per minute BP sitting:   120 / 80  (left arm)  Vitals Entered By: Willy Eddy, LPN (Sep 10, 2009 9:10 AM) CC: roa- c/o chronic sinus congestion, Lipid Management   CC:  roa- c/o chronic sinus congestion and Lipid Management.  History of Present Illness: the pt has increased allergy has nasal congestion on the right side with hx of nasal septal deviation and hx of allergies and chronic sinusitis   Lipid Management History:      Positive NCEP/ATP III risk factors include female age 67 years old or older, diabetes, and hypertension.  Negative NCEP/ATP III risk factors include no history of early menopause without estrogen hormone replacement, HDL cholesterol greater than 60, no family history for ischemic heart disease, non-tobacco-user status, no ASHD (atherosclerotic heart disease), no prior stroke/TIA, no peripheral vascular disease, and no history of aortic aneurysm.     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) 9)  Osteoporosis  (ICD-733.00) 10)  Breast Cancer, Hx of  (ICD-V10.3) 11)  Mitral Valve Prolapse  (ICD-424.0)  Current Problems (verified): 1)  Chronic Maxillary Sinusitis  (ICD-473.0) 2)  Acute Maxillary Sinusitis  (ICD-461.0) 3)  Uti  (ICD-599.0) 4)  Epistaxis  (ICD-784.7) 5)  Hypercholesterolemia, Borderline  (ICD-272.4) 6)  Advef,  Drug/medicinal/biological Subst Nos  (ICD-995.20) 7)  Hypoglycemia Nec  (ICD-251.1) 8)  Disorders, 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 .qd 2)  Calcium 600/vitamin D 600-200 Mg-Unit  Tabs (Calcium Carbonate-Vitamin D) .... One Twice A Day 3)  Chloral Hydrate 500 Mg/91ml  Syrp (Chloral Hydrate) .... Two Tsp Op Ghs As Needed Sleep 4)  Red Yeast Rice 600 Mg  Caps (Red Yeast Rice Extract) .Marland Kitchen.. 1 Once Daily 5)  Femara 2.5 Mg Tabs (Letrozole) .... Use As Directed 6)  Magnesium 300 Mg Caps (Magnesium) .Marland Kitchen.. 1 Once Daily 7)  Clarithromycin 500 Mg Xr24h-Tab (Clarithromycin) .... One By Mouth Two Times A Day For 14 Days ( Hold The Red Rice Yeast) 8)  Allerx-D 120-2.5 Mg Xr12h-Tab (Pseudoephedrine-Methscopolamin) .... One By Mouth Two Times A Day For 14 Days  Current Medications (verified): 1)  Therapeutic Multivitamin   Tabs (Multiple Vitamin) .... .qd 2)  Calcium 600/vitamin D 600-200 Mg-Unit  Tabs (Calcium Carbonate-Vitamin D) .... One Twice A Day 3)  Chloral Hydrate 500 Mg/67ml  Syrp (Chloral Hydrate) .... Two Tsp Op Ghs As Needed Sleep 4)  Red Yeast Rice 600 Mg  Caps (Red Yeast Rice Extract) .Marland Kitchen.. 1 Once Daily 5)  Femara 2.5 Mg Tabs (Letrozole) .... Use As Directed 6)  Magnesium 300 Mg Caps (Magnesium) .Marland KitchenMarland KitchenMarland Kitchen 1  Once Daily 7)  Clarithromycin 500 Mg Xr24h-Tab (Clarithromycin) .... One By Mouth Two Times A Day For 14 Days ( Hold The Red Rice Yeast) 8)  Allerx-D 120-2.5 Mg Xr12h-Tab (Pseudoephedrine-Methscopolamin) .... One By Mouth Two Times A Day For 14 Days  Allergies (verified): 1)  ! Sulfa 2)  ! Augmentin  Past History:  Family History: Last updated: 11/20/2006 Family History Hypertension mother  Fam hx Renal failure father mother died at 56 of old age  Social History: Last updated:  10/29/2006 Married Former Smoker  Risk Factors: Smoking Status: quit (09/10/2009)  Past medical, surgical, family and social histories (including risk factors) reviewed, and no changes noted (except as noted below).  Past Medical History: Reviewed history from 11/20/2006 and no changes required. Breast cancer, hx of right Osteoporosis  Past Surgical History: Reviewed history from 11/20/2006 and no changes required. Colonoscopy-07/05/2002 Parathyroidectomy-02/18/2005 plastic sx-face breast surgery Lumpectomy 2005 Tonsillectomy  Family History: Reviewed history from 11/20/2006 and no changes required. Family History Hypertension mother  Fam hx Renal failure father mother died at 83 of old age  Social History: Reviewed history from 10/29/2006 and no changes required. Married Former Smoker  Review of Systems  The patient denies anorexia, fever, weight loss, weight gain, vision loss, decreased hearing, hoarseness, chest pain, syncope, dyspnea on exertion, peripheral edema, prolonged cough, headaches, hemoptysis, abdominal pain, melena, hematochezia, severe indigestion/heartburn, hematuria, incontinence, genital sores, muscle weakness, suspicious skin lesions, transient blindness, difficulty walking, depression, unusual weight change, abnormal bleeding, enlarged lymph nodes, angioedema, and breast masses.    Physical Exam  General:  alert and well-developed.   Head:  normocephalic.   Eyes:  pupils equal, pupils round, conjunctival injection, and excessive tearing.   Ears:  R ear normal and L ear normal.   Nose:  no external deformity, mucosal erythema, mucosal edema, and airflow obstruction.   Neck:  No deformities, masses, or tenderness noted. Lungs:  Normal respiratory effort, chest expands symmetrically. Lungs are clear to auscultation, no crackles or wheezes. Heart:  Normal rate and regular rhythm. S1 and S2 normal without gallop, murmur, click, rub or other extra  sounds.   Impression & Recommendations:  Problem # 1:  CHRONIC MAXILLARY SINUSITIS (ICD-473.0)  Her updated medication list for this problem includes:    Clarithromycin 500 Mg Xr24h-tab (Clarithromycin) ..... One by mouth two times a day for 14 days ( hold the red rice yeast)    Allerx-d 120-2.5 Mg Xr12h-tab (Pseudoephedrine-methscopolamin) ..... One by mouth two times a day for 14 days    Fexofenadine-pseudoephedrine 60-120 Mg Xr12h-tab (Fexofenadine-pseudoephedrine) ..... One by mouth two times a day    Nasonex 50 Mcg/act Susp (Mometasone furoate) .Marland Kitchen..Marland Kitchen Two spray bin nare two times a day '  Take antibiotics for full duration. Discussed treatment options including indications for coronal CT scan of sinuses and ENT referral.   Problem # 2:  ALLERGIC RHINITIS DUE TO OTHER ALLERGEN (ICD-477.8) nasonex  Complete Medication List: 1)  Therapeutic Multivitamin Tabs (Multiple vitamin) .... .qd 2)  Calcium 600/vitamin D 600-200 Mg-unit Tabs (Calcium carbonate-vitamin d) .... One twice a day 3)  Chloral Hydrate 500 Mg/63ml Syrp (Chloral hydrate) .... Two tsp op ghs as needed sleep 4)  Red Yeast Rice 600 Mg Caps (Red yeast rice extract) .Marland Kitchen.. 1 once daily 5)  Femara 2.5 Mg Tabs (Letrozole) .... Use as directed 6)  Magnesium 300 Mg Caps (Magnesium) .Marland Kitchen.. 1 once daily 7)  Clarithromycin 500 Mg Xr24h-tab (Clarithromycin) .... One by mouth two times a day for  14 days ( hold the red rice yeast) 8)  Allerx-d 120-2.5 Mg Xr12h-tab (Pseudoephedrine-methscopolamin) .... One by mouth two times a day for 14 days 9)  Fexofenadine-pseudoephedrine 60-120 Mg Xr12h-tab (Fexofenadine-pseudoephedrine) .... One by mouth two times a day 10)  Nasonex 50 Mcg/act Susp (Mometasone furoate) .... Two spray bin nare two times a day '  Lipid Assessment/Plan:      Based on NCEP/ATP III, the patient's risk factor category is "history of diabetes".  The patient's lipid goals are as follows: Total cholesterol goal is 200; LDL  cholesterol goal is 100; HDL cholesterol goal is 40; Triglyceride goal is 150.  Her LDL cholesterol goal has been met.    Patient Instructions: 1)  Take the allergra d as needed  two times a day 2)  the nasonex use two spray in each nostril two times a day 3)  to start then after you get control use it once a day 4)  then use the saline lavage before the nasonex twice a day 5)  Please schedule a follow-up appointment in 1 month. Prescriptions: FEXOFENADINE-PSEUDOEPHEDRINE 60-120 MG XR12H-TAB (FEXOFENADINE-PSEUDOEPHEDRINE) one by mouth two times a day  #60 x 2   Entered and Authorized by:   Stacie Glaze MD   Signed by:   Stacie Glaze MD on 09/10/2009   Method used:   Electronically to        [facility] # [phone]* (retail)       [address] [place], [place]  [address]       Ph: [phone]       Fax: [phone]   RxID:   [id number] NASONEX 50 MCG/ACT SUSP (MOMETASONE FUROATE) two spray bin nare two times a day '  #1 x 11   Entered and Authorized by:   Stacie Glaze MD   Signed by:   Stacie Glaze MD on 09/10/2009   Method used:   Electronically to        [facility] # [phone]* (retail)       [address] [place], [place]  [address]       Ph: [phone]       Fax: [phone]   RxID:   [id number] FEXOFENADINE-PSEUDOEPHEDRINE 60-120 MG XR12H-TAB (FEXOFENADINE-PSEUDOEPHEDRINE) one by mouth two times a day  #60 x 2   Entered and Authorized by:   Stacie Glaze MD   Signed by:   Stacie Glaze MD on 09/10/2009   Method used:   Print then Give to Patient   RxID:   [id number] CHLORAL HYDRATE 500 MG/5ML  SYRP (CHLORAL HYDRATE) two tsp op gHS as needed sleep  #30 x 5   Entered by:   Willy Eddy, LPN   Authorized by:   Stacie Glaze MD   Signed by:   Willy Eddy, LPN on 20/25/4270   Method used:   Print then Give to Patient   RxID:   [id number]

## 2010-05-30 NOTE — Progress Notes (Signed)
Summary: wants ov on Friday  Phone Note Call from Patient Call back at [phone]   Caller: Patient---live call Reason for Call: Privacy/Consent Authorization Summary of Call: wants to see dr Lovell Sheehan this Friday, for a cough. Wants Bonnye to return call, today. She is in [place], but will be home. Initial call taken by: Warnell Forester,  July 18, 2009 8:42 AM  Follow-up for Phone Call        ov given for tomorrow- friday at 12 noon- pt informed Follow-up by: Willy Eddy, LPN,  July 19, 2009 8:47 AM

## 2010-05-30 NOTE — Assessment & Plan Note (Signed)
Summary: 30 MINUTE OV---PER DR Dmarion Perfect//CCM   Vital Signs:  Patient profile:   77 year old female Height:      66 inches (167.64 cm) Weight:      158 pounds (71.82 kg) BMI:     25.59 Temp:     97.9 degrees F (36.61 degrees C) oral Pulse rate:   72 / minute BP sitting:   112 / 70  (left arm) Cuff size:   regular  Vitals Entered By: Josph Macho RMA (November 09, 2009 3:41 PM)  Nutrition Counseling: Patient's BMI is greater than 25 and therefore counseled on weight management options.   History of Present Illness: Here for Medicare AWV:  1.   Risk factors based on Past M, S, F history: reviewed and reveiwed consultants reports and labs 2.   Physical Activities:   active, exercoized regularly 3.   Depression/mood:  not depressed 4.   Hearing:  can hear whispered voice at 6 feet 5.   ADL's:  performs a;ll appropriate ADLs 6.   Fall Risk:  none noted 7.   Home Safety:  no safely risks noted  8.   Height, weight, &visual acuity: see form 9.   Counseling:   weigth and lipids 10.   Labs ordered based on risk factors:   reviewed labs form 11.           Referral Coordination   with oncology 12.           Care Plan see forms 13.            Cognitive Assessment  normal   Current Medications (verified): 1)  Therapeutic Multivitamin   Tabs (Multiple Vitamin) .... .qd 2)  Calcium 600/vitamin D 600-200 Mg-Unit  Tabs (Calcium Carbonate-Vitamin D) .... One Twice A Day 3)  Chloral Hydrate 500 Mg/44ml  Syrp (Chloral Hydrate) .... Two Tsp Op Ghs As Needed Sleep 4)  Red Yeast Rice 600 Mg  Caps (Red Yeast Rice Extract) .Marland Kitchen.. 1 Once Daily 5)  Femara 2.5 Mg Tabs (Letrozole) .... Use As Directed 6)  Magnesium 300 Mg Caps (Magnesium) .Marland Kitchen.. 1 Once Daily 7)  Fexofenadine-Pseudoephedrine 60-120 Mg Xr12h-Tab (Fexofenadine-Pseudoephedrine) .... One By Mouth Two Times A Day 8)  Nasonex 50 Mcg/act Susp (Mometasone Furoate) .... Two Spray Bin Nare Two Times A Day '  Allergies (verified): 1)  ! Sulfa 2)  !  Augmentin  Past History:  Family History: Last updated: 11/20/2006 Family History Hypertension mother  Fam hx Renal failure father mother died at 100 of old age  Social History: Last updated: 10/29/2006 Married Former Smoker  Risk Factors: Smoking Status: quit (10/11/2009)  Past medical, surgical, family and social histories (including risk factors) reviewed, and no changes noted (except as noted below).  Past Medical History: Reviewed history from 11/20/2006 and no changes required. Breast cancer, hx of right Osteoporosis  Past Surgical History: Reviewed history from 11/20/2006 and no changes required. Colonoscopy-07/05/2002 Parathyroidectomy-02/18/2005 plastic sx-face breast surgery Lumpectomy 2005 Tonsillectomy  Family History: Reviewed history from 11/20/2006 and no changes required. Family History Hypertension mother  Fam hx Renal failure father mother died at 26 of old age  Social History: Reviewed history from 10/29/2006 and no changes required. Married Former Smoker  Review of Systems  The patient denies anorexia, fever, weight loss, weight gain, vision loss, decreased hearing, hoarseness, chest pain, syncope, dyspnea on exertion, peripheral edema, prolonged cough, headaches, hemoptysis, abdominal pain, melena, hematochezia, severe indigestion/heartburn, hematuria, incontinence, genital sores, muscle weakness, suspicious skin lesions, transient blindness,  difficulty walking, depression, unusual weight change, abnormal bleeding, enlarged lymph nodes, angioedema, and breast masses.    Physical Exam  General:  alert and well-developed.   Head:  normocephalic.   Eyes:  pupils equal, pupils round, conjunctival injection, and excessive tearing.   Ears:  R ear normal and L ear normal.   Nose:  nasal dischargemucosal pallor, mucosal edema, and airflow obstruction.   Mouth:  no gingival abnormalities and pharynx pink and moist.   Neck:  No deformities, masses, or  tenderness noted. Lungs:  Normal respiratory effort, chest expands symmetrically. Lungs are clear to auscultation, no crackles or wheezes. Heart:  Normal rate and regular rhythm. S1 and S2 normal without gallop, murmur, click, rub or other extra sounds. Abdomen:  soft, non-tender, and normal bowel sounds.   Msk:  no joint tenderness and no joint swelling.   Pulses:  R and L carotid,radial,femoral,dorsalis pedis and posterior tibial pulses are full and equal bilaterally Neurologic:  alert & oriented X3.   Cervical Nodes:  no anterior cervical adenopathy.   Axillary Nodes:  no R axillary adenopathy and no L axillary adenopathy.   Inguinal Nodes:  no R inguinal adenopathy and no L inguinal adenopathy.   Psych:  Oriented X3 and good eye contact.     Impression & Recommendations:  Problem # 1:  PREVENTIVE HEALTH CARE (ICD-V70.0)  The pt was asked about all immunizations, health maint. services that are appropriate to their age and was given guidance on diet exercize  and weight management  Mammogram: normal (02/26/2008) Pap smear: normal (11/26/2007) Colonoscopy: Location:  [facility].   (10/19/2007) Td Booster: Historical (04/28/2005)   Flu Vax: Fluvax 3+ (02/09/2009)   Pneumovax: Historical (04/28/2004) Chol: 222 (11/23/2008)   HDL: 83.50 (11/23/2008)   LDL: DEL (11/22/2007)   TG: 88 (11/22/2007) TSH: 1.12 (11/22/2007)   Next mammogram due:: 02/2009 (11/23/2008)  Discussed using sunscreen, use of alcohol, drug use, self breast exam, routine dental care, routine eye care, schedule for GYN exam, routine physical exam, seat belts, multiple vitamins, osteoporosis prevention, adequate calcium intake in diet, recommendations for immunizations, mammograms and Pap smears.  Discussed exercise and checking cholesterol.  Discussed gun safety, safe sex, and contraception.  Orders: First annual wellness visit with prevention plan  [phone])  Problem # 2:  HYPERCHOLESTEROLEMIA, BORDERLINE  (ICD-272.4)  Labs Reviewed: SGOT: 22 (11/23/2008)   SGPT: 16 (11/23/2008)  Lipid Goals: Chol Goal: 200 (02/02/2007)   HDL Goal: 40 (02/02/2007)   LDL Goal: 100 (02/02/2007)   TG Goal: 150 (02/02/2007)  Prior 10 Yr Risk Heart Disease: Not enough information (02/02/2007)   HDL:83.50 (11/23/2008), 79.2 (11/22/2007)  LDL:DEL (11/22/2007), DEL (01/27/2007)  Chol:222 (11/23/2008), 227 (11/22/2007)  Trig:88 (11/22/2007), 78 (01/27/2007)  Orders: TLB-Lipid Panel ([id number])  Complete Medication List: 1)  Therapeutic Multivitamin Tabs (Multiple vitamin) .... .qd 2)  Calcium 600/vitamin D 600-200 Mg-unit Tabs (Calcium carbonate-vitamin d) .... One twice a day 3)  Chloral Hydrate 500 Mg/51ml Syrp (Chloral hydrate) .... Two tsp op ghs as needed sleep 4)  Red Yeast Rice 600 Mg Caps (Red yeast rice extract) .Marland Kitchen.. 1 once daily 5)  Femara 2.5 Mg Tabs (Letrozole) .... Use as directed 6)  Magnesium 300 Mg Caps (Magnesium) .Marland Kitchen.. 1 once daily 7)  Fexofenadine-pseudoephedrine 60-120 Mg Xr12h-tab (Fexofenadine-pseudoephedrine) .... One by mouth two times a day 8)  Nasonex 50 Mcg/act Susp (Mometasone furoate) .... Two spray bin nare two times a day '  Patient Instructions: 1)  Please schedule a follow-up appointment in  6 months.  Prevention & Chronic Care Immunizations   Influenza vaccine: Fluvax 3+  (02/09/2009)    Tetanus booster: 04/28/2005: Historical    Pneumococcal vaccine: Historical  (04/28/2004)   Pneumococcal vaccine deferral: Deferred  (11/09/2009)    H. zoster vaccine: Not documented  Colorectal Screening   Hemoccult: Not documented    Colonoscopy: Location:  [facility].    (10/19/2007)   Colonoscopy due: 10/18/2017  Other Screening   Pap smear: normal  (11/26/2007)   Pap smear due: 11/2009    Mammogram: normal  (02/26/2008)   Mammogram due: 02/2009    DXA bone density scan: Not documented   Smoking status: quit  (10/11/2009)  Lipids   Total Cholesterol:  222  (11/23/2008)   Lipid panel action/deferral: Lipid Panel ordered   LDL: DEL  (11/22/2007)   LDL Direct: 122.1  (11/23/2008)   HDL: 83.50  (11/23/2008)   Triglycerides: 88  (11/22/2007)    SGOT (AST): 22  (11/23/2008)   SGPT (ALT): 16  (11/23/2008)   Alkaline phosphatase: 55  (11/23/2008)   Total bilirubin: 1.0  (11/23/2008)  Self-Management Support :    Patient will work on the following items until the next clinic visit to reach self-care goals:     Medications and monitoring: take my medicines every day  (11/09/2009)     Eating: drink diet soda or water instead of juice or soda  (11/09/2009)     Activity: take a 30 minute walk every day  (11/09/2009)    Lipid self-management support: Education handout, Pre-printed educational material  (11/09/2009)     Lipid education handout printed  Appended Document: Orders Update    Clinical Lists Changes  Orders: Added new Service order of Venipuncture [phone]) - Signed

## 2010-05-30 NOTE — Letter (Signed)
Summary: [facility]  [facility]   Imported By: Maryln Gottron 03/11/2010 12:27:30  _____________________________________________________________________  External Attachment:    Type:   Image     Comment:   External Document

## 2010-05-30 NOTE — Letter (Signed)
[facility]: [facility]   Imported By: Maryln Gottron 10/26/2009 11:15:46  _____________________________________________________________________  External Attachment:    Type:   Image     Comment:   External Document

## 2010-07-02 ENCOUNTER — Telehealth: Payer: Self-pay | Admitting: Internal Medicine

## 2010-07-02 NOTE — Telephone Encounter (Signed)
[facility]ph---[phone].

## 2010-07-04 ENCOUNTER — Other Ambulatory Visit: Payer: Self-pay | Admitting: *Deleted

## 2010-07-04 DIAGNOSIS — G4700 Insomnia, unspecified: Secondary | ICD-10-CM

## 2010-07-04 MED ORDER — CHLORAL HYDRATE 500 MG/5ML PO SYRP: 500.0000 mg | ORAL_SOLUTION | Freq: Every evening | ORAL | Status: DC | PRN

## 2010-08-05 ENCOUNTER — Telehealth: Payer: Self-pay | Admitting: *Deleted

## 2010-08-05 NOTE — Telephone Encounter (Signed)
Tennis Tournament and arthritis kicked in and wants to have Meloxicam.  [facility] Pt knows Dr. Lovell Sheehan is away. Does not want to see anyone else.  Just ask him when he gets back.

## 2010-08-07 ENCOUNTER — Other Ambulatory Visit: Payer: Self-pay | Admitting: Dermatology

## 2010-08-12 MED ORDER — MELOXICAM 15 MG PO TABS: 15.0000 mg | ORAL_TABLET | Freq: Every day | ORAL | Status: DC

## 2010-08-12 NOTE — Telephone Encounter (Signed)
Dr jenkins agrees 

## 2010-08-12 NOTE — Telephone Encounter (Signed)
Pt is having sinus congestion from dust as she is in process of moving and had allergies. She is going to try some Flonase she has and Mucinex D.

## 2010-08-12 NOTE — Telephone Encounter (Signed)
Per dr Lovell Sheehan- ok to have mobic-generic- 15 qd

## 2010-08-20 ENCOUNTER — Encounter: Payer: Self-pay | Admitting: Internal Medicine

## 2010-08-20 ENCOUNTER — Telehealth: Payer: Self-pay | Admitting: *Deleted

## 2010-08-20 ENCOUNTER — Ambulatory Visit (INDEPENDENT_AMBULATORY_CARE_PROVIDER_SITE_OTHER): Payer: Medicare Other | Admitting: Internal Medicine

## 2010-08-20 DIAGNOSIS — J329 Chronic sinusitis, unspecified: Secondary | ICD-10-CM

## 2010-08-20 MED ORDER — FLUTICASONE PROPIONATE 50 MCG/ACT NA SUSP: 1.0000 | Freq: Every day | NASAL | Status: DC

## 2010-08-20 MED ORDER — LEVOFLOXACIN 500 MG PO TABS: 500.0000 mg | ORAL_TABLET | Freq: Every day | ORAL | Status: AC

## 2010-08-20 MED ORDER — MOMETASONE FUROATE 50 MCG/ACT NA SUSP: 2.0000 | Freq: Every day | NASAL | Status: DC

## 2010-08-20 NOTE — Telephone Encounter (Signed)
Pt would like to refill Flonase at [facility])

## 2010-08-20 NOTE — Assessment & Plan Note (Signed)
Continue Flonase. Begin Levaquin.Followup if no improvement or worsening.

## 2010-08-20 NOTE — Progress Notes (Signed)
  Subjective:    Patient ID: [name], female    DOB: [birth year], 77 y.o.   MRN: [id number]  HPI Pt presents to clinic for evaluation of cough. History history of cough nonproductive without fever chills wheezing or shortness of breath. Symptoms began after cleaning attic. Has had intermittent mild tooth pain. No sick exposure. Denies sinus tenderness or pressure. No alleviating or exacerbating factors. Taking Mucinex and Flonase without significant improvement. No other complaints.  Reviewed past medical history, medications and allergies.    Review of Systems see history of present illness     Objective:   Physical Exam    Physical Exam  [nursing notereviewed. Constitutional:  appears well-developed and well-nourished. No distress.  HENT:  Head: Normocephalic and atraumatic.  Right Ear: Tympanic membrane, external ear and ear canal normal.  Left Ear: Tympanic membrane, external ear and ear canal normal.  Nose: Nose normal. Mild bilateral paranasal tenderness. Mouth/Throat: Oropharynx is clear and moist. No oropharyngeal exudate.  Eyes: Conjunctivae are normal. No scleral icterus.  Neck: Neck supple.  Cardiovascular: Normal rate, regular rhythm and normal heart sounds.  Exam reveals no gallop and no friction rub.   No murmur heard. Pulmonary/Chest: Effort normal and breath sounds normal. No respiratory distress. She has no wheezes. She has no rales.  Lymphadenopathy:     no cervical adenopathy.  Neurological:  alert.  Skin: Skin is warm and dry.  not diaphoretic.      Assessment & Plan:

## 2010-09-13 NOTE — Op Note (Signed)
[name], [name]             ACCOUNT NO.:  [id number]   MEDICAL RECORD NO.:  [id number]          PATIENT TYPE:  OIB   LOCATION:  5710                         FACILITY:  [facility]   PHYSICIAN:  Velora Heckler, MD      DATE OF BIRTH:  01-13-1933   DATE OF PROCEDURE:  02/18/2005  DATE OF DISCHARGE:                                 OPERATIVE REPORT   PREOPERATIVE DIAGNOSIS:  Primary hyperparathyroidism.   POSTOPERATIVE DIAGNOSIS:  Primary hyperparathyroidism.   PROCEDURE:  Minimally invasive parathyroidectomy (ectopic right inferior  gland).   SURGEON:  Velora Heckler, MD   ANESTHESIA:  General.   ESTIMATED BLOOD LOSS:  Minimal.   PREPARATION:  Betadine.   COMPLICATIONS:  None.   INDICATIONS:  The patient is a 77 year old white female from [place], [place] who presents at the request of Dr. Darryll Capers for  evaluation of primary hyperparathyroidism.  The patient has had an extensive  workup.  She has been seen by Dr. Dorisann Frames, her endocrinologist.  The  patient has an elevated serum calcium level of 10.4 to 10.8.  She has an  elevated PTH level at the upper range of normal of 70.9.  Twenty-four-hour  urine calcium was elevated a 397.5.  Sestamibi scan performed at [facility], July 27,2006, demonstrated a parathyroid adenoma in the right  inferior position.  The patient now comes to surgery for resection.   BODY OF REPORT:  Procedure is done in OR #1 at the [facility].  The patient is brought to the operating room and placed in a  supine position on the operating room table.  Following the administration  of general anesthesia, the patient is positioned and then prepped and draped  in the usual strict aseptic fashion.  The patient was then scanned again  with the Neoprobe, localizing an area of increased radioactivity in the  right inferior neck.  Incision is made with a #15 blade and carried down  through subcutaneous tissues and  platysma; hemostasis is obtained with the  electrocautery.  Skin flaps are developed circumferentially and a Weitlaner  retractor is placed for exposure.  Strap muscles are incised in the midline  and reflected laterally.  Right thyroid lobe, inferior pole, is exposed.  Carotid artery is closely approximated to the trachea.  Scanning with the  Neoprobe shows some increased activity alongside the trachea inferiorly.  No  obvious adenoma is evident at the inferior pole.  Exploration, however, in  the thyrothymic tract shows an obvious mass.  This is gently dissected out.  Venous tributaries are divided with between small Ligaclips.  The tract is  opened and tissue is elevated from behind the clavicle.  Dissection is  carried down to adipose tissue, which is divided with the electrocautery.  The remaining tissue is excised completely.  It is sectioned on the  operating room table and shows approximately a 1.5-cm parathyroid gland  within the thyrothymic tract tissue.  This is submitted fresh to Pathology,  where Dr. Claudius Sis confirms parathyroid tissue on frozen section.  Tissue is consistent with parathyroid adenoma.  Good hemostasis is noted.  Surgicel is placed along the inferior pole of the thyroid gland and into the  thyrothymic tract into the anterior mediastinum.  Good hemostasis is  achieved.  Strap muscles are reapproximated in the midline with interrupted  3-0 Vicryl sutures.  Platysma is closed with interrupted 3-0 Vicryl sutures.  Skin edges are anesthetized with local anesthetic.  Skin edges are  reapproximated with a running 4-0 Vicryl subcuticular suture.  Wound is  washed and dried and Benzoin and Steri-Strips are applied.  Sterile  dressings were applied.  The patient is awakened from anesthesia and brought  to the recovery room in stable condition.  The patient tolerated the  procedure well.      Velora Heckler, MD  Electronically Signed     TMG/MEDQ  D:   02/18/2005  T:  02/19/2005  Job:  [id number]   cc:   Stacie Glaze, M.D. [facility]  [address] [place]  [place] [address]   Dorisann Frames, M.D.  Fax: [phone]   Rose Phi. Young, M.D.  [address], [address]  [place]  [place] [address]   Artist Pais, M.D.  Fax: [phone]

## 2010-09-13 NOTE — Op Note (Signed)
[name], PENNEBAKER             ACCOUNT NO.:  [id number]   MEDICAL RECORD NO.:  [id number]          PATIENT TYPE:  AMB   LOCATION:  DSC                          FACILITY:  [facility]   PHYSICIAN:  Rose Phi. Maple Hudson, M.D.   DATE OF BIRTH:  1932/12/07   DATE OF PROCEDURE:  03/05/2004  DATE OF DISCHARGE:                                 OPERATIVE REPORT   PREOPERATIVE DIAGNOSIS:  Stage I carcinoma of the right breast.   POSTOPERATIVE DIAGNOSIS:  Stage I carcinoma of the right breast.   PROCEDURE:  1.  Blue dye injection.  2.  Right partial mastectomy with needle localization and specimen      mammogram.  3.  Right sentinel lymph node biopsy.   SURGEON:  Rose Phi. Maple Hudson, M.D.   ANESTHESIA:  General.   DESCRIPTION OF PROCEDURE:  Prior to coming to the operating room, a  localizing wire had been placed to mark the tumor in the upper inner  quadrant of her right breast.  In addition 1 millicurie of technetium sulfur  colloid was injected intradermally.   After suitable general anesthesia was induced.  The patient was placed in  the supine position with arms extended on the arm board.  5 mL of a mixture  of 2 mL of methylene blue and 3 mL of injectable saline was injected in the  subareolar breast tissue.  A gentle massage for three minutes was carried  out.   We then prepped and draped the right breast and axilla.  A curved incision  was then made in the upper inner quadrant of her right breast using the  previously placed wire as a guide and a wide excision of the wire and  surrounding tissue was carried out.  Hemostasis was obtained with cautery.   The specimen was then submitted to the radiologist for mammogram.   While that was being done, a transverse incision was made in the right  axilla with dissection through the subcutaneous tissue to the clavipectoral  fascia.  Just deep to the fascia was a blue and quite hot lymph node with  counts in excess of 1000.  I excised that.  There  were no other blue,  palpable, or hot nodes.  That sentinel node was then submitted to the  pathologist.   The specimen mammogram confirmed the removal of the lesion and the touch  preps for the sentinel node and the margins were all clean.   Both incisions were then injected with 0.25% Marcaine.  They were closed in  two layers with 3-0 Vicryl and subcuticular 4-0 Monocryl and Steri-Strips.  Dressings applied.  The patient transferred to the recovery room in  satisfactory condition having tolerated the procedure well.      Pete   PRY/MEDQ  D:  03/05/2004  T:  03/05/2004  Job:  [id number]

## 2010-10-07 ENCOUNTER — Telehealth: Payer: Self-pay | Admitting: *Deleted

## 2010-10-07 MED ORDER — FLUTICASONE PROPIONATE 50 MCG/ACT NA SUSP: 1.0000 | Freq: Every day | NASAL | Status: DC

## 2010-10-07 MED ORDER — MELOXICAM 15 MG PO TABS: 15.0000 mg | ORAL_TABLET | Freq: Every day | ORAL | Status: DC

## 2010-10-07 NOTE — Telephone Encounter (Signed)
Done

## 2010-10-07 NOTE — Telephone Encounter (Signed)
Wants refills on generic Flonase, and Meloxicam to [facility].

## 2010-10-08 ENCOUNTER — Other Ambulatory Visit: Payer: Self-pay | Admitting: Oncology

## 2010-10-08 ENCOUNTER — Encounter (HOSPITAL_BASED_OUTPATIENT_CLINIC_OR_DEPARTMENT_OTHER): Payer: Medicare Other | Admitting: Oncology

## 2010-10-08 DIAGNOSIS — C50219 Malignant neoplasm of upper-inner quadrant of unspecified female breast: Secondary | ICD-10-CM

## 2010-10-08 LAB — CBC WITH DIFFERENTIAL/PLATELET
Basophils Absolute: 0 10*3/uL (ref 0.0–0.1)
EOS%: 1.4 % (ref 0.0–7.0)
Eosinophils Absolute: 0.1 10*3/uL (ref 0.0–0.5)
HCT: 37.3 % (ref 34.8–46.6)
MCH: 32.1 pg (ref 25.1–34.0)
MCHC: 33.8 g/dL (ref 31.5–36.0)
MCV: 95 fL (ref 79.5–101.0)
MONO%: 7.1 % (ref 0.0–14.0)
NEUT#: 2.5 10*3/uL (ref 1.5–6.5)
Platelets: 146 10*3/uL (ref 145–400)
RBC: 3.93 10*6/uL (ref 3.70–5.45)
WBC: 5 10*3/uL (ref 3.9–10.3)

## 2010-10-08 LAB — COMPREHENSIVE METABOLIC PANEL
ALT: 20 U/L (ref 0–35)
AST: 23 U/L (ref 0–37)
Calcium: 9 mg/dL (ref 8.4–10.5)
Chloride: 104 mEq/L (ref 96–112)
Creatinine, Ser: 0.89 mg/dL (ref 0.50–1.10)
Sodium: 138 mEq/L (ref 135–145)
Total Protein: 5.9 g/dL — ABNORMAL LOW (ref 6.0–8.3)

## 2010-10-08 LAB — CANCER ANTIGEN 27.29: CA 27.29: 21 U/mL (ref 0–39)

## 2010-10-08 LAB — VITAMIN D 25 HYDROXY (VIT D DEFICIENCY, FRACTURES): Vit D, 25-Hydroxy: 46 ng/mL (ref 30–89)

## 2010-10-09 ENCOUNTER — Encounter (HOSPITAL_BASED_OUTPATIENT_CLINIC_OR_DEPARTMENT_OTHER): Payer: Medicare Other | Admitting: Oncology

## 2010-10-09 DIAGNOSIS — Z853 Personal history of malignant neoplasm of breast: Secondary | ICD-10-CM

## 2010-10-16 ENCOUNTER — Other Ambulatory Visit: Payer: Self-pay | Admitting: Oncology

## 2010-10-16 DIAGNOSIS — Z853 Personal history of malignant neoplasm of breast: Secondary | ICD-10-CM

## 2010-10-17 ENCOUNTER — Ambulatory Visit (INDEPENDENT_AMBULATORY_CARE_PROVIDER_SITE_OTHER): Payer: Medicare Other | Admitting: Internal Medicine

## 2010-10-17 ENCOUNTER — Other Ambulatory Visit: Payer: Self-pay | Admitting: Oncology

## 2010-10-17 VITALS — BP 130/80 | HR 56 | Temp 98.5°F | Resp 14 | Ht 65.0 in | Wt 158.0 lb

## 2010-10-17 DIAGNOSIS — E785 Hyperlipidemia, unspecified: Secondary | ICD-10-CM

## 2010-10-17 DIAGNOSIS — Z853 Personal history of malignant neoplasm of breast: Secondary | ICD-10-CM

## 2010-10-17 DIAGNOSIS — C384 Malignant neoplasm of pleura: Secondary | ICD-10-CM

## 2010-10-17 DIAGNOSIS — E161 Other hypoglycemia: Secondary | ICD-10-CM

## 2010-10-17 DIAGNOSIS — G4709 Other insomnia: Secondary | ICD-10-CM

## 2010-10-17 DIAGNOSIS — Z0000 Encounter for general adult medical examination without abnormal findings: Secondary | ICD-10-CM

## 2010-10-17 DIAGNOSIS — J3089 Other allergic rhinitis: Secondary | ICD-10-CM

## 2010-10-17 DIAGNOSIS — E162 Hypoglycemia, unspecified: Secondary | ICD-10-CM

## 2010-10-17 DIAGNOSIS — M810 Age-related osteoporosis without current pathological fracture: Secondary | ICD-10-CM

## 2010-10-17 LAB — HEPATIC FUNCTION PANEL
ALT: 26 U/L (ref 0–35)
AST: 25 U/L (ref 0–37)
Bilirubin, Direct: 0.1 mg/dL (ref 0.0–0.3)
Total Bilirubin: 0.8 mg/dL (ref 0.3–1.2)
Total Protein: 6.5 g/dL (ref 6.0–8.3)

## 2010-10-17 LAB — CBC WITH DIFFERENTIAL/PLATELET
Basophils Absolute: 0 10*3/uL (ref 0.0–0.1)
Eosinophils Absolute: 0.1 10*3/uL (ref 0.0–0.7)
Lymphocytes Relative: 32.7 % (ref 12.0–46.0)
Lymphs Abs: 2 10*3/uL (ref 0.7–4.0)
MCHC: 33.7 g/dL (ref 30.0–36.0)
Monocytes Relative: 6.6 % (ref 3.0–12.0)
Platelets: 158 10*3/uL (ref 150.0–400.0)
RDW: 13.8 % (ref 11.5–14.6)

## 2010-10-17 LAB — LIPID PANEL
HDL: 98.4 mg/dL (ref >39.00)
Total CHOL/HDL Ratio: 3
Triglycerides: 56 mg/dL (ref 0.0–149.0)
VLDL: 11.2 mg/dL (ref 0.0–40.0)

## 2010-10-17 LAB — BASIC METABOLIC PANEL
BUN: 16 mg/dL (ref 6–23)
CO2: 28 mEq/L (ref 19–32)
Chloride: 104 mEq/L (ref 96–112)
Creatinine, Ser: 0.7 mg/dL (ref 0.4–1.2)
Potassium: 4.8 mEq/L (ref 3.5–5.1)

## 2010-10-17 LAB — POCT URINALYSIS DIPSTICK
Leukocytes, UA: NEGATIVE
Nitrite, UA: NEGATIVE
Protein, UA: NEGATIVE
pH, UA: 7

## 2010-10-17 LAB — TSH: TSH: 0.85 u[IU]/mL (ref 0.35–5.50)

## 2010-10-17 LAB — LDL CHOLESTEROL, DIRECT: Direct LDL: 141.7 mg/dL

## 2010-10-17 MED ORDER — CHLORAL HYDRATE 500 MG/5ML PO SYRP: 500.0000 mg | ORAL_SOLUTION | Freq: Every evening | ORAL | Status: DC | PRN

## 2010-10-17 NOTE — Progress Notes (Signed)
Subjective:    Patient ID: Elpidio Eric, female    DOB: 06-10-1932, 77 y.o.   MRN: [id number]  HPI Medicare welness Hx of hypogylcemia, insomnia, osteoposis Review of medications discussion of her sleep hygiene and review of her diet   Review of Systems     Objective:   Physical Exam     She Medicare wellness exam   Assessment & Plan:   Subjective:    COLLEENA KURTENBACH is a 77 y.o. female who presents for Medicare Annual/Subsequent preventive examination.  Preventive Screening-Counseling & Management  Tobacco History  Smoking status  . Former Smoker  Smokeless tobacco  . Not on file     Problems Prior to Visit 1.   Current Problems (verified) Patient Active Problem List  Diagnoses  . HYPOGLYCEMIA NEC  . HYPERCHOLESTEROLEMIA, BORDERLINE  . DISORDERS, ORGANIC INSOMNIA NEC  . MITRAL VALVE PROLAPSE  . CHRONIC MAXILLARY SINUSITIS  . ALLERGIC RHINITIS DUE TO OTHER ALLERGEN  . OSTEOPOROSIS  . EPISTAXIS  . ADVEF, DRUG/MEDICINAL/BIOLOGICAL SUBST NOS  . BREAST CANCER, HX OF  . Sinusitis    Medications Prior to Visit Current Outpatient Prescriptions on File Prior to Visit  Medication Sig Dispense Refill  . Calcium ([facility] CALCIUM-600/VIT D) 600-200 MG-UNIT per tablet Take 1 tablet by mouth daily.        . fexofenadine-pseudoephedrine (ALLEGRA-D) 60-120 MG per tablet Take 1 tablet by mouth 2 (two) times daily.        . fluticasone (FLONASE) 50 MCG/ACT nasal spray 1 spray by Nasal route daily.  16 g  11  . fluticasone (FLONASE) 50 MCG/ACT nasal spray Place 1 spray into the nose daily.  16 g  11  . Magnesium 300 MG CAPS Take by mouth daily.        . meloxicam (MOBIC) 15 MG tablet Take 1 tablet (15 mg total) by mouth daily.  30 tablet  6  . Multiple Vitamin (MULTIVITAMIN) tablet Take 1 tablet by mouth daily.        . Red Yeast Rice 600 MG CAPS Take by mouth daily.        Marland Kitchen DISCONTD: chloral hydrate 500 MG/5ML syrup Take 500 mg by mouth at bedtime as needed.         Marland Kitchen DISCONTD: letrozole (FEMARA) 2.5 MG tablet Take 2.5 mg by mouth daily.          Current Medications (verified) Current Outpatient Prescriptions  Medication Sig Dispense Refill  . Calcium ([facility] CALCIUM-600/VIT D) 600-200 MG-UNIT per tablet Take 1 tablet by mouth daily.        . chloral hydrate 500 MG/5ML syrup Take 5 mLs (500 mg total) by mouth at bedtime as needed.  473 mL  3  . fexofenadine-pseudoephedrine (ALLEGRA-D) 60-120 MG per tablet Take 1 tablet by mouth 2 (two) times daily.        . fluticasone (FLONASE) 50 MCG/ACT nasal spray 1 spray by Nasal route daily.  16 g  11  . fluticasone (FLONASE) 50 MCG/ACT nasal spray Place 1 spray into the nose daily.  16 g  11  . Magnesium 300 MG CAPS Take by mouth daily.        . meloxicam (MOBIC) 15 MG tablet Take 1 tablet (15 mg total) by mouth daily.  30 tablet  6  . Multiple Vitamin (MULTIVITAMIN) tablet Take 1 tablet by mouth daily.        . Red Yeast Rice 600 MG CAPS Take by mouth daily.        Marland Kitchen  DISCONTD: chloral hydrate 500 MG/5ML syrup Take 500 mg by mouth at bedtime as needed.        Marland Kitchen DISCONTD: letrozole (FEMARA) 2.5 MG tablet Take 2.5 mg by mouth daily.           Allergies (verified) JSH:FWYOVZCHYIF+OYDXAJOIN+OMVEHMCNOB acid+aspartame and Sulfonamide derivatives   PAST HISTORY  Family History Family History  Problem Relation Age of Onset  . Hypertension Mother   . Kidney disease Father     Social History History  Substance Use Topics  . Smoking status: Former Games developer  . Smokeless tobacco: Not on file  . Alcohol Use: Not on file     Are there smokers in your home (other than you)? No  Risk Factors Current exercise habits: Gym/ health club routine includes cardio.  Dietary issues discussed: hypoglycemic diet   Cardiac risk factors: advanced age (older than 20 for men, 39 for women) and dyslipidemia.  Depression Screen (Note: if answer to either of the following is "Yes", a more complete depression screening is  indicated)   Over the past two weeks, have you felt down, depressed or hopeless? Yes  Over the past two weeks, have you felt little interest or pleasure in doing things? Yes  Have you lost interest or pleasure in daily life? No  Do you often feel hopeless? No  Do you cry easily over simple problems? No  Activities of Daily Living In your present state of health, do you have any difficulty performing the following activities?:  Driving? No Managing money?  No Feeding yourself? No Getting from bed to chair? No Climbing a flight of stairs? No Preparing food and eating?: No Bathing or showering? No Getting dressed: No Getting to the toilet? No Using the toilet:No Moving around from place to place: No In the past year have you fallen or had a near fall?:No   Are you sexually active?  No  Do you have more than one partner?  No  Hearing Difficulties: No Do you often ask people to speak up or repeat themselves? No Do you experience ringing or noises in your ears? No Do you have difficulty understanding soft or whispered voices? No   Do you feel that you have a problem with memory? No  Do you often misplace items? No  Do you feel safe at home?  No  Cognitive Testing  Alert? Yes  Normal Appearance?Yes  Oriented to person? Yes  Place? Yes   Time? Yes  Recall of three objects?  Yes  Can perform simple calculations? Yes  Displays appropriate judgment?Yes  Can read the correct time from a watch face?Yes   Advanced Directives have been discussed with the patient? Yes  List the Names of Other Physician/Practitioners you currently use: 1.  [place] internists  Indicate any recent Medical Services you may have received from other than [facility] providers in the past year (date may be approximate).  Immunization History  Administered Date(s) Administered  . Influenza Whole 01/27/2004, 02/02/2007, 02/08/2008, 02/09/2009, 01/16/2010  . Pneumococcal Polysaccharide 04/28/2004  . Td  04/28/2005    Screening Tests Health Maintenance  Topic Date Due  . Zostavax  02/18/1995  . Influenza Vaccine  01/27/2011  . Tetanus/tdap  04/29/2015  . Colonoscopy  11/10/2019  . Pneumococcal Polysaccharide Vaccine Age 24 And Over  Completed    All answers were reviewed with the patient and necessary referrals were made:  Carrie Mew   10/17/2010   History reviewed: allergies, current medications, past family history, past medical history,  past social history, past surgical history and problem list  Review of Systems A comprehensive review of systems was negative.    Objective:     Vision by Snellen chart: right eye:20/20, left eye:20/20  Body mass index is 26.29 kg/(m^2). BP 130/80  Pulse 56  Temp 98.5 F (36.9 C)  Resp 14  Ht 5\' 5"  (1.651 m)  Wt 158 lb (71.668 kg)  BMI 26.29 kg/m2  BP 130/80  Pulse 56  Temp 98.5 F (36.9 C)  Resp 14  Ht 5\' 5"  (1.651 m)  Wt 158 lb (71.668 kg)  BMI 26.29 kg/m2  General Appearance:    Alert, cooperative, no distress, appears stated age  Head:    Normocephalic, without obvious abnormality, atraumatic  Eyes:    PERRL, conjunctiva/corneas clear, EOM's intact, fundi    benign, both eyes  Ears:    Normal TM's and external ear canals, both ears  Nose:   Nares normal, septum midline, mucosa normal, no drainage    or sinus tenderness  Throat:   Lips, mucosa, and tongue normal; teeth and gums normal  Neck:   Supple, symmetrical, trachea midline, no adenopathy;    thyroid:  no enlargement/tenderness/nodules; no carotid   bruit or JVD  Back:     Symmetric, no curvature, ROM normal, no CVA tenderness  Lungs:     Clear to auscultation bilaterally, respirations unlabored  Chest Wall:    No tenderness or deformity   Heart:    Regular rate and rhythm, S1 and S2 normal, no murmur, rub   or gallop  Breast Exam:    No tenderness, masses, or nipple abnormality  Abdomen:     Soft, non-tender, bowel sounds active all four quadrants,    no  masses, no organomegaly  Genitalia:    Normal female without lesion, discharge or tenderness  Rectal:    Normal tone, normal prostate, no masses or tenderness;   guaiac negative stool  Extremities:   Extremities normal, atraumatic, no cyanosis or edema  Pulses:   2+ and symmetric all extremities  Skin:   Skin color, texture, turgor normal, no rashes or lesions  Lymph nodes:   Cervical, supraclavicular, and axillary nodes normal  Neurologic:   CNII-XII intact, normal strength, sensation and reflexes    throughout       Assessment:       This is a routine physical examination for this healthy  Female. Reviewed all health maintenance protocols including mammography colonoscopy bone density and reviewed appropriate screening labs. Her immunization history was reviewed as well as her current medications and allergies refills of her chronic medications were given and the plan for yearly health maintenance was discussed all orders and referrals were made as appropriate.    Plan:     patient was also seen for followup of her history of hypoglycemia review of her diet for her hypercholesterolemia review of her cholesterol management 4 insomnia refill of her medications and for medication management During the course of the visit the patient was educated and counseled about appropriate screening and preventive services including:    Influenza vaccine  Advanced directives: has an advanced directive - a copy has been provided  Diet review for nutrition referral? Yes ____  Not Indicated ___x_   Patient Instructions (the written plan) was given to the patient.  Medicare Attestation I have personally reviewed: The patient's medical and social history Their use of alcohol, tobacco or illicit drugs Their current medications and supplements The patient's functional ability  including ADLs,fall risks, home safety risks, cognitive, and hearing and visual impairment Diet and physical  activities Evidence for depression or mood disorders  The patient's weight, height, BMI, and visual acuity have been recorded in the chart.  I have made referrals, counseling, and provided education to the patient based on review of the above and I have provided the patient with a written personalized care plan for preventive services.     Carrie Mew   10/17/2010

## 2010-10-28 ENCOUNTER — Telehealth: Payer: Self-pay | Admitting: Internal Medicine

## 2010-10-28 NOTE — Telephone Encounter (Signed)
Pt called req lab results from [facility]. Pls call asap.

## 2010-11-04 NOTE — Telephone Encounter (Signed)
Pt informed - although tota lcholesterol is high ,her hdl is great at 90+ and dr Lovell Sheehan does not want to go on a statin now

## 2010-11-06 ENCOUNTER — Other Ambulatory Visit: Payer: Self-pay | Admitting: *Deleted

## 2010-11-06 MED ORDER — CHLORAL HYDRATE 500 MG/5ML PO SYRP: 1000.0000 mg | ORAL_SOLUTION | Freq: Every evening | ORAL | Status: DC | PRN

## 2010-12-06 ENCOUNTER — Telehealth: Payer: Self-pay | Admitting: Internal Medicine

## 2010-12-06 ENCOUNTER — Other Ambulatory Visit (INDEPENDENT_AMBULATORY_CARE_PROVIDER_SITE_OTHER): Payer: Medicare Other

## 2010-12-06 ENCOUNTER — Encounter: Payer: Self-pay | Admitting: Internal Medicine

## 2010-12-06 ENCOUNTER — Other Ambulatory Visit: Payer: Self-pay | Admitting: *Deleted

## 2010-12-06 ENCOUNTER — Observation Stay (HOSPITAL_COMMUNITY)
Admission: AD | Admit: 2010-12-06 | Discharge: 2010-12-07 | Disposition: A | Discharge status: Home / Self Care | Payer: Medicare Other | Source: Ambulatory Visit | Attending: Internal Medicine | Admitting: Internal Medicine

## 2010-12-06 ENCOUNTER — Ambulatory Visit (INDEPENDENT_AMBULATORY_CARE_PROVIDER_SITE_OTHER): Payer: Medicare Other | Admitting: Internal Medicine

## 2010-12-06 DIAGNOSIS — R109 Unspecified abdominal pain: Secondary | ICD-10-CM

## 2010-12-06 DIAGNOSIS — K2970 Gastritis, unspecified, without bleeding: Secondary | ICD-10-CM | POA: Insufficient documentation

## 2010-12-06 DIAGNOSIS — K259 Gastric ulcer, unspecified as acute or chronic, without hemorrhage or perforation: Secondary | ICD-10-CM | POA: Insufficient documentation

## 2010-12-06 DIAGNOSIS — M810 Age-related osteoporosis without current pathological fracture: Secondary | ICD-10-CM | POA: Insufficient documentation

## 2010-12-06 DIAGNOSIS — K922 Gastrointestinal hemorrhage, unspecified: Secondary | ICD-10-CM

## 2010-12-06 DIAGNOSIS — R1013 Epigastric pain: Secondary | ICD-10-CM

## 2010-12-06 DIAGNOSIS — Z853 Personal history of malignant neoplasm of breast: Secondary | ICD-10-CM | POA: Insufficient documentation

## 2010-12-06 DIAGNOSIS — Z01810 Encounter for preprocedural cardiovascular examination: Secondary | ICD-10-CM | POA: Insufficient documentation

## 2010-12-06 DIAGNOSIS — Z79899 Other long term (current) drug therapy: Secondary | ICD-10-CM | POA: Insufficient documentation

## 2010-12-06 DIAGNOSIS — K921 Melena: Secondary | ICD-10-CM | POA: Insufficient documentation

## 2010-12-06 DIAGNOSIS — Z01812 Encounter for preprocedural laboratory examination: Secondary | ICD-10-CM | POA: Insufficient documentation

## 2010-12-06 DIAGNOSIS — K449 Diaphragmatic hernia without obstruction or gangrene: Secondary | ICD-10-CM | POA: Insufficient documentation

## 2010-12-06 LAB — CBC WITH DIFFERENTIAL/PLATELET
Basophils Relative: 0.7 % (ref 0.0–3.0)
Eosinophils Absolute: 0.1 10*3/uL (ref 0.0–0.7)
Eosinophils Relative: 1.5 % (ref 0.0–5.0)
HCT: 31.4 % — ABNORMAL LOW (ref 36.0–46.0)
Hemoglobin: 10.5 g/dL — ABNORMAL LOW (ref 12.0–15.0)
Lymphs Abs: 1.9 10*3/uL (ref 0.7–4.0)
MCHC: 33.3 g/dL (ref 30.0–36.0)
MCV: 96.6 fl (ref 78.0–100.0)
Monocytes Absolute: 0.5 10*3/uL (ref 0.1–1.0)
Neutro Abs: 3.9 10*3/uL (ref 1.4–7.7)
Neutrophils Relative %: 59.9 % (ref 43.0–77.0)
RBC: 3.25 Mil/uL — ABNORMAL LOW (ref 3.87–5.11)
WBC: 6.5 10*3/uL (ref 4.5–10.5)

## 2010-12-06 LAB — URINALYSIS
Hgb urine dipstick: NEGATIVE
Leukocytes, UA: NEGATIVE
Specific Gravity, Urine: 1.02 (ref 1.000–1.030)
Urine Glucose: NEGATIVE
Urobilinogen, UA: 0.2 (ref 0.0–1.0)

## 2010-12-06 LAB — CBC
MCH: 32 pg (ref 26.0–34.0)
MCHC: 34.3 g/dL (ref 30.0–36.0)
MCV: 93.5 fL (ref 78.0–100.0)
Platelets: 195 10*3/uL (ref 150–400)
RBC: 3.09 MIL/uL — ABNORMAL LOW (ref 3.87–5.11)

## 2010-12-06 LAB — HEPATIC FUNCTION PANEL
ALT: 21 U/L (ref 0–35)
Albumin: 3.9 g/dL (ref 3.5–5.2)
Total Protein: 6.3 g/dL (ref 6.0–8.3)

## 2010-12-06 LAB — ABO/RH: ABO/RH(D): B POS

## 2010-12-06 LAB — HEMOGLOBIN AND HEMATOCRIT, BLOOD
HCT: 30.4 % — ABNORMAL LOW (ref 36.0–46.0)
Hemoglobin: 10.3 g/dL — ABNORMAL LOW (ref 12.0–15.0)

## 2010-12-06 LAB — PROTIME-INR
INR: 1.06 (ref 0.00–1.49)
Prothrombin Time: 14 seconds (ref 11.6–15.2)

## 2010-12-06 NOTE — Telephone Encounter (Signed)
Per Kendal Hymen @ Dr. Lovell Sheehan office. Pt is going to be sent to hosp.

## 2010-12-06 NOTE — Progress Notes (Signed)
Subjective:    Patient ID: [name], female    DOB: 07/12/1932, 77 y.o.   MRN: [id number]  HPI Pt has been "weak" all week. These feeling started with weakness following a tennis game from which she had to retire early. She has noted a several week hx of of pain in the mid epigastrium. Intermittent at rest and consistently post pradial. She has noted dark stools and nausea. No hx of ulcers. No hx of gastritis.  Has reflux symptoms with burping after dinner.  Hgh base line is 14 and now 10.5    Review of Systems  Constitutional: Negative for activity change, appetite change and fatigue.  HENT: Negative for ear pain, congestion, neck pain, postnasal drip and sinus pressure.   Eyes: Negative for redness and visual disturbance.  Respiratory: Negative for cough, shortness of breath and wheezing.   Gastrointestinal: Positive for abdominal pain, blood in stool and abdominal distention.  Genitourinary: Negative for dysuria, frequency and menstrual problem.  Musculoskeletal: Negative for myalgias, joint swelling and arthralgias.  Skin: Negative for rash and wound.  Neurological: Negative for dizziness, weakness and headaches.  Hematological: Negative for adenopathy. Does not bruise/bleed easily.  Psychiatric/Behavioral: Negative for sleep disturbance and decreased concentration.   Past Medical History  Diagnosis Date  . Cancer     rt breast  . Osteoporosis    Past Surgical History  Procedure Date  . Breast surgery   . Tonsillectomy   . Parathyroidectomy   . Cosmetic surgery     face    reports that she has quit smoking. She does not have any smokeless tobacco history on file. Her alcohol and drug histories not on file. family history includes Hypertension in her mother and Kidney disease in her father. Allergies  Allergen Reactions  . BJY:NWGNFAOZHYQ+MVHQIONGE+XBMWUXLKGM Acid+Aspartame     REACTION: hives  . Sulfonamide Derivatives     REACTION: hives         Objective:   Physical Exam  Nursing note and vitals reviewed. Constitutional: She is oriented to person, place, and time. She appears well-nourished.       Pale  HENT:  Head: Normocephalic and atraumatic.       Conjunctiva pale  Eyes: Conjunctivae are normal. Pupils are equal, round, and reactive to light.  Neck: Normal range of motion. Neck supple.  Cardiovascular: Normal rate and regular rhythm.   Pulmonary/Chest: Effort normal and breath sounds normal.  Abdominal: She exhibits distension. There is tenderness. There is no rebound and no guarding.       Markedly increased bowel sounds throughout the abdominal area  Musculoskeletal: Normal range of motion. She exhibits tenderness.  Neurological: She is alert and oriented to person, place, and time.  Skin: Skin is warm and dry.  Psychiatric: She has a normal mood and affect. Her behavior is normal.          Assessment & Plan:  I suspect that she has an upper GI bleed has been going on about 5 days. Her hemoglobin is down from 14-10.5. Her stool this morning was a little looser dark in color. She's had 2 bowel movements this morning. She is currently not lightheaded. She does have mild to moderate nausea She is not orthostatic She is tearfull and worried. I have spent more that 45 min face to face including1/2 time counciling.  I have discussed with Dr Leone Payor and we both agree that she should be admitted and probably scoped this week  Patient will be sent over  with orders to notify our gastroenterology when she hits the floor as well as the admitting hospital team

## 2010-12-06 NOTE — Telephone Encounter (Signed)
[name] called back and states that pt is being sent to the hospital to disregard this message.

## 2010-12-07 LAB — CBC
HCT: 29.2 % — ABNORMAL LOW (ref 36.0–46.0)
HCT: 27.7 % — ABNORMAL LOW (ref 36.0–46.0)
Hemoglobin: 9.4 g/dL — ABNORMAL LOW (ref 12.0–15.0)
MCH: 31.8 pg (ref 26.0–34.0)
MCH: 32.3 pg (ref 26.0–34.0)
MCH: 31.9 pg (ref 26.0–34.0)
MCHC: 33.9 g/dL (ref 30.0–36.0)
MCV: 93.9 fL (ref 78.0–100.0)
MCV: 94.2 fL (ref 78.0–100.0)
MCV: 93.9 fL (ref 78.0–100.0)
Platelets: 193 10*3/uL (ref 150–400)
RBC: 2.96 MIL/uL — ABNORMAL LOW (ref 3.87–5.11)
RBC: 3.1 MIL/uL — ABNORMAL LOW (ref 3.87–5.11)
RDW: 13.4 % (ref 11.5–15.5)
WBC: 6.2 10*3/uL (ref 4.0–10.5)
WBC: 5.5 10*3/uL (ref 4.0–10.5)

## 2010-12-07 LAB — COMPREHENSIVE METABOLIC PANEL
AST: 18 U/L (ref 0–37)
Albumin: 3.1 g/dL — ABNORMAL LOW (ref 3.5–5.2)
BUN: 11 mg/dL (ref 6–23)
Creatinine, Ser: 0.7 mg/dL (ref 0.50–1.10)
Potassium: 4 mEq/L (ref 3.5–5.1)
Total Protein: 5.5 g/dL — ABNORMAL LOW (ref 6.0–8.3)

## 2010-12-07 LAB — TROPONIN I: Troponin I: <0.3 ng/mL (ref <0.30)

## 2010-12-08 NOTE — Discharge Summary (Signed)
[name], [name] NO.:  [id number]  MEDICAL RECORD NO.:  [id number]  LOCATION:  1413                         FACILITY:  [facility]  PHYSICIAN:  Talmage Nap, MD  DATE OF BIRTH:  04/24/1933  DATE OF ADMISSION:  12/06/2010 DATE OF DISCHARGE:  12/07/2010                        DISCHARGE SUMMARY - REFERRING   PRIMARY CARE PHYSICIAN:  Stacie Glaze, MD  ADMITTING PHYSICIAN:  Richarda Overlie, MD  CONSULTANTS:  GI, Dr. Carie Caddy. Pyrtle  DISCHARGE DIAGNOSES: 1. Weakness, most likely secondary to anemia. 2. Anemia, status post EGD.  Findings are (a) normal esophagus; (b)     small hiatal hernia; (c) ulcer in the gastric antrum; (d) mild     gastritis in the antrum and prepyloric stomach; (e) normal     duodenum. 3. History of osteoporosis. 4. Osteoarthritis.  BRIEF HISTORY AND PHYSICAL:  The patient is a 77 year old very pleasant Caucasian female with history of breast CA and osteoporosis who was admitted to the hospital on December 06, 2010, by Dr. Richarda Overlie with complaints of weakness.  The patient had earlier on presented to her primary care doctor, Dr. Darryll Capers, in his office with complaints of generalized weakness and a feeling of not feeling too well for the past 5 days.  She was said to have complained about difficulty in eating and also epigastric discomfort that was radiating to the upper quadrant and to the back.  She was also said to have noticed melanotic stool.  She claimed she had 2 to 3 episodes of black tarry stools for the 5 days' duration.  In her PCP's office, basic hematological indices done showed hemoglobin to be 10.5 and her baseline was said to have been about 14 g. She was also said to have been feeling very dizzy and had lightheadedness but did not any syncopal episodes.  She denied any chest pain.  She denied any shortness of breath.  She was also said to have denied any history of taking aspirin or nonsteroidal  anti- inflammatories.  After evaluating the patient, the patient was asked to come to the hospital for further workup.  PREADMISSION MEDICATIONS:  Include Mobic.  ALLERGIES:  AMOXICILLIN, ASPARTAME, and SULFONAMIDES.  PAST SURGICAL HISTORY: 1. Breast surgery, type of surgery is unknown. 2. Tonsillectomy. 3. Parathyroidectomy. 4. The patient was also said to have some cosmetic surgery on her     face.  FAMILY HISTORY:  None documented.  SOCIAL HISTORY:  None documented.  REVIEW OF SYSTEMS:  Please refer to Dr. Cherlyn Cushing history and physical.  At the time the patient was seen by the admitting physician, vital signs:  Blood pressure was 130/64, respiratory rate 18, pulse 62, temperature 97.7.  She was said to be saturating 97% on room air. HEENT:  Pupils were reactive to light and extraocular muscles were intact.  Neck:  No jugular venous distention.  No carotid bruit.  No lymphadenopathy.  Chest:  Clear to auscultation.  Heart:  Heart sounds were 1 and 2.  Abdomen:  Soft with mild tenderness in the epigastric region with no rebound, no rigidity.  No guarding.  Liver, spleen, kidney not palpable.  Bowel sounds were positive.  Extremities:  No  pedal edema.  Neurologic:  Nonfocal.  Musculoskeletal:  Unremarkable. Skin:  Normal turgor.  LAB DATA:  Initial complete blood count with differential showed WBC of 6.5, hemoglobin of 10.5, hematocrit of 31.4, MCV of 96.6 with a platelet count of 196, normal differential.  Urine microscopy:  Unremarkable. LFT:  Unremarkable.  Fecal occult blood test was said to have been negative.  Cardiac markers were unremarkable.  A repeat complete blood count with no differential done on December 07, 2010, at 3:40 a.m. showed WBC of 6.2, hemoglobin 9.4, hematocrit 27.8, MCV of 93.8 with a platelet count of 196.  H and H was done q.6 subsequently.  And the last CBC with no differential done on December 07, 2010, at 1504 hours showed WBC of 5.3,  hemoglobin of 9.4, hematocrit of 27.7, MCV of 93.9 with a platelet count of 193.  A repeat comprehensive metabolic panel done on December 07, 2010, showed sodium of 138, potassium of 4.0, chloride of 107 with a bicarb of 24, glucose was 100, BUN was 11, creatinine was 0.70, and LFTs were essentially normal.  HOSPITAL COURSE:  The patient was admitted to telemetry.  She was started on normal saline IV to go at rate of 100 mL an hour.  She was typed-and-screened, and she was also made n.p.o. except medications. She was given Xanax 0.25 mg p.o. b.i.d. and she was placed on Protonix drip 80 mg IV stat and subsequently 8 mg per hour continuous infusion. Also given to the patient was Tylenol 650 mg p.o. rectally and Ambien 5 mg p.o. at bedtime.  The patient was evaluated by the gastroenterologist, Dr. Rhea Belton, and subsequently had upper endoscopy done.  Findings on upper endoscopy are as described above and recommendations as per GI physician post endoscopy include the following: 1. The patient should be on pantoprazole 40 mg p.o. b.i.d. for at     least 12 weeks. 2. To avoid nonsteroidal anti-inflammatory drugs. 3. To check H. pylori serum antibody. 4. Then a repeat EGD will be done in 12 weeks to ensure healing of     gastric ulcer.  The patient was initially seen by me pre-endoscopy and she denied any specific complaint.  Examination of the patient was essentially unremarkable.  Her vital signs:  Blood pressure 118/69, pulse 59, respiratory rate 16, temperature is 98.1.  She was also seen by me post upper endoscopy again.  Denied any hematochezia or melena. Examination of the patient was essentially unremarkable.  I had an extensive discussion with the patient about the EGD report and she verbalized understanding.  She, however, requested prescription for Xanax and Protonix.  The patient is medically stable.  DISPOSITION AND DISCHARGE INSTRUCTIONS:  The patient to be discharged home  today on activity as tolerated, low-sodium, low-cholesterol diet. No aspirin.  No NSAIDS and she will have H. pylori serum antibody done before being discharged.  She will be followed up by her primary care physician in 1 to 2 weeks.  DISCHARGE MEDICATIONS: 1. Alprazolam (Xanax) 0.5 mg half a tablet p.o. b.i.d. p.r.n. 2. Pantoprazole (Protonix) 40 mg p.o. b.i.d. with meals for the next 3     months. 3. Tylenol 500 mg 2 tablets p.o. daily at bedtime. 4. Calcium carbonate/vitamin D over-the-counter 1 p.o. daily. 5. Chloral hydrate liquid 500 mg/5 mL 2 teaspoonfuls by mouth daily at     bedtime. 6. Magnesium oxide (Mag-Ox) 1 tablet p.o. daily. 7. Multivitamin 1 p.o. daily.     Talmage Nap, MD  CN/MEDQ  D:  12/07/2010  T:  12/07/2010  Job:  [id number]  cc:   Stacie Glaze, MD [address] [place] [address]  Electronically Signed by Talmage Nap  on 12/08/2010 07:35:17 AM

## 2010-12-09 ENCOUNTER — Telehealth: Payer: Self-pay | Admitting: *Deleted

## 2010-12-09 ENCOUNTER — Encounter: Payer: Self-pay | Admitting: Internal Medicine

## 2010-12-09 DIAGNOSIS — Z8719 Personal history of other diseases of the digestive system: Secondary | ICD-10-CM

## 2010-12-09 NOTE — Telephone Encounter (Signed)
lmom for pt to call back. Pt needs CBC and H.Pylori Ab around 12/20/10 and a repeat EGD around 01/31/11 or 02/03/11.

## 2010-12-09 NOTE — Telephone Encounter (Signed)
Message copied by Florene Glen on Mon Dec 09, 2010  8:43 AM ------      Message from: Beverley Fiedler      Created: Sun Dec 08, 2010 11:18 AM      Regarding: Follow up labs       [name]      The above pt needs to be scheduled for an outpatient CBC in 2 weeks.  This is hospital f/u after GU bleed.      She also needs EGD in 8-12 weeks with me to ensure healing.      Finally, I ordered a H. Pylori serum Ab in the hospital, but I am unsure if this was drawn.  If it wasn't, it needs to be and can be done when we do her f/u CBC.      Thanks      Vonna Kotyk.

## 2010-12-10 ENCOUNTER — Telehealth: Payer: Self-pay

## 2010-12-10 ENCOUNTER — Other Ambulatory Visit: Payer: Self-pay | Admitting: Internal Medicine

## 2010-12-10 LAB — CROSSMATCH
Antibody Screen: NEGATIVE
Unit division: 0

## 2010-12-10 NOTE — Telephone Encounter (Signed)
No problem.

## 2010-12-10 NOTE — Telephone Encounter (Signed)
H. Pylori neg.  Recent GU likely NSAID (mobic) related.  Will have EGD in 8-12 weeks to ensure healing.

## 2010-12-10 NOTE — Telephone Encounter (Signed)
Dr Rhea Belton will you accept her as a patient?

## 2010-12-10 NOTE — Telephone Encounter (Signed)
Pt will come in 12/13/10 for a CBC and Dr Rhea Belton will advise once results are received; pt stated understanding.

## 2010-12-10 NOTE — Telephone Encounter (Signed)
Spoke with pt to schedule her lab and ENDO. Results from H.Pylori final- negative, and pt stated she had a H&H done at the same time, but I see nothing after 1500 and her HGB was 9.4. Pt is questioning this result because it is too low. She would like a CBC done the end of this week- will ask Dr Rhea Belton.  ENDO is scheduled for 01/31/11 at 9am.

## 2010-12-10 NOTE — Telephone Encounter (Signed)
Patient is requesting to be a patient of Dr Rhea Belton.  She connected well with him in the hospital.  Dr Marina Goodell do you approve of switch?

## 2010-12-11 NOTE — Telephone Encounter (Signed)
Yes. Fine by me. 

## 2010-12-11 NOTE — Telephone Encounter (Signed)
Patient is scheduled for follow up EGD 10/5

## 2010-12-13 ENCOUNTER — Telehealth: Payer: Self-pay | Admitting: Internal Medicine

## 2010-12-13 ENCOUNTER — Encounter (INDEPENDENT_AMBULATORY_CARE_PROVIDER_SITE_OTHER): Payer: Medicare Other

## 2010-12-13 DIAGNOSIS — Z8719 Personal history of other diseases of the digestive system: Secondary | ICD-10-CM

## 2010-12-13 LAB — CBC WITH DIFFERENTIAL/PLATELET
Eosinophils Absolute: 0.1 10*3/uL (ref 0.0–0.7)
Eosinophils Relative: 1.7 % (ref 0.0–5.0)
HCT: 34 % — ABNORMAL LOW (ref 36.0–46.0)
Lymphs Abs: 1.6 10*3/uL (ref 0.7–4.0)
MCHC: 33.2 g/dL (ref 30.0–36.0)
MCV: 96.4 fl (ref 78.0–100.0)
Monocytes Absolute: 0.3 10*3/uL (ref 0.1–1.0)
Platelets: 217 10*3/uL (ref 150.0–400.0)
RDW: 13.9 % (ref 11.5–14.6)
WBC: 4.8 10*3/uL (ref 4.5–10.5)

## 2010-12-13 NOTE — Telephone Encounter (Signed)
Spoke with insurance company at [phone] for prior auth for BID Protonix. Prior Auth was obtained, but rep stated it may take 24-48hrs tp update. I called [facility] and the order did not go through, but the Pharmacist will check again tomorrow and Sunday if necessary.

## 2010-12-16 ENCOUNTER — Telehealth: Payer: Self-pay | Admitting: *Deleted

## 2010-12-16 DIAGNOSIS — K250 Acute gastric ulcer with hemorrhage: Secondary | ICD-10-CM

## 2010-12-16 NOTE — Telephone Encounter (Signed)
Notified pt per Dr Rhea Belton, her CBC is up to 11.3 from around 10; good news, it means the ulcer is no longer bleeding. He does not know if it's cancer, but that's why he is repeating the EGD: 1) to ensure ulcer is healing, 2) if it's present at EGD, he will BX it. Will recheck her CBC on 01/09/11.

## 2010-12-16 NOTE — Progress Notes (Signed)
This encounter was created in error - please disregard.

## 2010-12-16 NOTE — Patient Instructions (Signed)
Opened in error

## 2010-12-16 NOTE — Telephone Encounter (Signed)
Message copied by Florene Glen on Mon Dec 16, 2010  8:51 AM ------      Message from: Beverley Fiedler      Created: Mon Dec 16, 2010  8:15 AM       [name],      Please let [name] know the results of her CBC.  Hgb is up to 11.3 (which is up from around 10 in the hospital). This is good news and what we would expect at this point.  It also suggests the ulcer is no longer bleeding.      As far as her questions... We technically do not know that it is not cancer, which is the reason for the relook EGD (to ensure healing).  If the ulcer is present at repeat EGD, then we will bx it.      Finally, given that her Hgb is increasing we can likely wait about 4 weeks to recheck her CBC.      Thanks.      Vonna Kotyk.

## 2010-12-18 ENCOUNTER — Other Ambulatory Visit: Payer: Self-pay

## 2010-12-18 MED ORDER — PANTOPRAZOLE SODIUM 40 MG PO TBEC: 40.0000 mg | DELAYED_RELEASE_TABLET | Freq: Two times a day (BID) | ORAL | Status: DC

## 2010-12-24 ENCOUNTER — Telehealth: Payer: Self-pay

## 2010-12-24 NOTE — Telephone Encounter (Signed)
Pt requesting the name and number of the therapist that Dr. Lovell Sheehan recommended for her to speak with to help keep her calm. Please advise

## 2010-12-24 NOTE — Telephone Encounter (Signed)
Pt left message stating the she would like to talk to Dr. Lovell Sheehan concerning the "name of somebody" that was discussed during pt's recent OV.

## 2010-12-25 NOTE — Telephone Encounter (Signed)
LMTCB

## 2010-12-25 NOTE — Telephone Encounter (Signed)
Per dr Lovell Sheehan- it was dr Dellia Cloud she may call jennifer at [phone] for appointment

## 2010-12-30 NOTE — H&P (Signed)
NAMESHENISE, WOLGAMOTT             ACCOUNT NO.:  [id number]  MEDICAL RECORD NO.:  [id number]  LOCATION:  1413                         FACILITY:  [facility]  PHYSICIAN:  Richarda Overlie, MD       DATE OF BIRTH:  03/18/1933  DATE OF ADMISSION:  12/06/2010 DATE OF DISCHARGE:                             HISTORY & PHYSICAL   PRIMARY CARE PHYSICIAN:  Stacie Glaze, MD  CHIEF COMPLAINT:  Weakness.  SUBJECTIVE:  This is a 77 year old female with a history of breast cancer, osteoporosis, who presents to Dr. Darryll Capers' office with a chief complaint of generalized weakness.  The patient has not been feeling well for the last 5 days.  She has had difficulty eating.  She states that she has difficulty once she swallows the food, and after she has swallowed the food, she starts developing some epigastric distress radiating into the right upper quadrant and into the back.  She has also been noticing melena for the last 5 days.  She has had at least 2 to 3 episodes of melena on a daily basis.  In Dr. Lovell Sheehan' office, she was found to have a drop in her hemoglobin from 14, which is her baseline, to 10.5.  The patient was playing tennis on Monday and felt dizzy and lightheaded but did not have any syncopal episode.  She denied any chest pain, any palpitations per se.  She has been taking Mobic for right shoulder pain but has discontinued this 2 days' ago.  She denies taking aspirin or any other NSAIDs at this time.  She denies any weight loss. She does not have any history of peptic ulcer disease or any history of gastritis.  REVIEW OF SYSTEMS:  10-point review of systems was done as documented in the HPI.  PAST MEDICAL HISTORY:  History of breast cancer, osteoporosis.  SURGICAL HISTORY:  History of breast surgery, tonsillectomy, parathyroidectomy, cosmetic surgery of the face.  ALLERGIES:  AMOXICILLIN, ASPARTAME, AND SULFONAMIDES.  HOME MEDICATIONS:  Mobic.  PHYSICAL EXAMINATION:  VITAL  SIGNS:  Blood pressure 133/64, respirations 18, pulse 62, temperature 97.7, oxygen saturation 97% on room air. GENERAL:  Comfortable.  Currently in no acute cardiopulmonary distress. HEENT:  Pupils equal and reactive.  Extraocular movements intact. NECK:  Supple.  No JVD. LUNGS:  Clear to auscultation bilaterally.  No wheezes or crackles or rhonchi. CARDIOVASCULAR:  Regular rate and rhythm.  No murmurs, rubs or gallops. ABDOMEN:  Mild tenderness to palpation in the epigastric region with hyperactive bowel sounds without any rebound, guarding.  No hepatosplenomegaly. MUSCULOSKELETAL:  Normal range of motion. NEUROLOGIC:  Cranial nerves II-XII grossly intact. SKIN:  Without any rashes. PSYCHIATRIC:  Appropriate mood and affect.  LABORATORY DATA:  Hemoglobin 10.3, hematocrit 30.4.  Hepatic function: Total bilirubin 0.7, AST 22, ALT 21, total protein 6.3.  ASSESSMENT AND PLAN: 1. Anemia likely secondary to upper gastrointestinal bleeding.  The     patient has had several episodes of melena at home.  She needs a GI     evaluation.  Dr. Rhea Belton from [facility] has been notified and the     patient will have an endoscopy done either today or tomorrow.  We  will keep her n.p.o. until she has been evaluated by GI.  Will     order serial CBCs.  The patient will be started on a Protonix drip.     We will transfuse her if her hemoglobin drops less than 8 or she     continues to have active bleeding. 2. Anxiety:  The patient complains of feeling anxious given the recent     stressors over the course of the last 1 month.  We will start her     on Xanax as needed for that. 3. She is a full code. 4. She will be started on SCDs for deep venous thrombosis prophylaxis.     Richarda Overlie, MD     NA/MEDQ  D:  12/06/2010  T:  12/06/2010  Job:  [id number]  Electronically Signed by Richarda Overlie MD on 12/30/2010 07:57:24 PM

## 2011-01-07 ENCOUNTER — Other Ambulatory Visit: Payer: Self-pay | Admitting: *Deleted

## 2011-01-09 ENCOUNTER — Other Ambulatory Visit (INDEPENDENT_AMBULATORY_CARE_PROVIDER_SITE_OTHER): Payer: Medicare Other

## 2011-01-09 ENCOUNTER — Telehealth: Payer: Self-pay | Admitting: Internal Medicine

## 2011-01-09 DIAGNOSIS — K250 Acute gastric ulcer with hemorrhage: Secondary | ICD-10-CM

## 2011-01-09 LAB — CBC WITH DIFFERENTIAL/PLATELET
Basophils Relative: 0.6 % (ref 0.0–3.0)
Eosinophils Absolute: 0.1 10*3/uL (ref 0.0–0.7)
MCHC: 33.1 g/dL (ref 30.0–36.0)
MCV: 95.2 fl (ref 78.0–100.0)
Monocytes Absolute: 0.3 10*3/uL (ref 0.1–1.0)
Neutro Abs: 2.6 10*3/uL (ref 1.4–7.7)
Neutrophils Relative %: 54.5 % (ref 43.0–77.0)
RBC: 4.18 Mil/uL (ref 3.87–5.11)
RDW: 13.1 % (ref 11.5–14.6)

## 2011-01-09 NOTE — Telephone Encounter (Signed)
Notified pt per Dr Rhea Belton that her H&H have normalized/stablized and we will repeat the EGD on 01/31/11 to ensure her ulcer has healed or is healing. Pt stated understanding.

## 2011-01-27 ENCOUNTER — Ambulatory Visit
Admission: RE | Admit: 2011-01-27 | Discharge: 2011-01-27 | Disposition: A | Discharge status: Home / Self Care | Payer: Medicare Other | Source: Ambulatory Visit | Attending: Oncology | Admitting: Oncology

## 2011-01-27 DIAGNOSIS — Z853 Personal history of malignant neoplasm of breast: Secondary | ICD-10-CM

## 2011-01-27 DIAGNOSIS — M8580 Other specified disorders of bone density and structure, unspecified site: Secondary | ICD-10-CM

## 2011-01-27 DIAGNOSIS — C384 Malignant neoplasm of pleura: Secondary | ICD-10-CM

## 2011-01-29 ENCOUNTER — Ambulatory Visit (INDEPENDENT_AMBULATORY_CARE_PROVIDER_SITE_OTHER): Payer: Medicare Other

## 2011-01-29 DIAGNOSIS — Z23 Encounter for immunization: Secondary | ICD-10-CM

## 2011-01-31 ENCOUNTER — Encounter: Payer: Self-pay | Admitting: Internal Medicine

## 2011-01-31 ENCOUNTER — Ambulatory Visit (AMBULATORY_SURGERY_CENTER): Payer: Medicare Other | Admitting: Internal Medicine

## 2011-01-31 ENCOUNTER — Other Ambulatory Visit: Payer: Self-pay | Admitting: Internal Medicine

## 2011-01-31 VITALS — BP 157/73 | HR 55 | Temp 98.1°F | Resp 14 | Ht 65.0 in | Wt 160.0 lb

## 2011-01-31 DIAGNOSIS — K319 Disease of stomach and duodenum, unspecified: Secondary | ICD-10-CM

## 2011-01-31 DIAGNOSIS — K250 Acute gastric ulcer with hemorrhage: Secondary | ICD-10-CM

## 2011-01-31 MED ORDER — PANTOPRAZOLE SODIUM 40 MG PO TBEC: 40.0000 mg | DELAYED_RELEASE_TABLET | Freq: Two times a day (BID) | ORAL | Status: DC

## 2011-01-31 MED ORDER — SODIUM CHLORIDE 0.9 % IV SOLN: 500.0000 mL | INTRAVENOUS | Status: DC

## 2011-01-31 NOTE — Patient Instructions (Signed)
Normal Esophagus  Ulcer in antrum  Hiatal Hernia  Avoid all NSAIDS's including Mobic.  Continue Pantoprazole 40mg  twice daily.

## 2011-02-03 ENCOUNTER — Telehealth: Payer: Self-pay | Admitting: *Deleted

## 2011-02-03 NOTE — Telephone Encounter (Signed)
No answer, no identifier, no message left on machine. TE

## 2011-02-10 ENCOUNTER — Encounter: Payer: Self-pay | Admitting: Internal Medicine

## 2011-02-10 ENCOUNTER — Telehealth: Payer: Self-pay | Admitting: Internal Medicine

## 2011-02-10 NOTE — Telephone Encounter (Signed)
Given the benign findings on pathology this time, I think it is reasonable to perform the re-look EGD once she returns from [place] in early spring Please place a reminder for this procedure Thank you

## 2011-02-10 NOTE — Telephone Encounter (Signed)
Informed pt her biopsy was neg. For H.Pylori and her ulcer had healed. Pt stated she thought Dr Rhea Belton wanted to check her again in January, but we can't schedule that far ahead. Pt leaves for [place] soon, but can fly back in for an EGD for that appt or she can have it done when she returns home next spring. Dr Rhea Belton, please advise. Thanks.

## 2011-02-10 NOTE — Telephone Encounter (Signed)
Will send a reminder for repeat EGD in the spring. Pt's number in [facility] is [phone]. lmom for her to call back if not OK.

## 2011-05-30 DIAGNOSIS — L570 Actinic keratosis: Secondary | ICD-10-CM | POA: Diagnosis not present

## 2011-05-30 DIAGNOSIS — L820 Inflamed seborrheic keratosis: Secondary | ICD-10-CM | POA: Diagnosis not present

## 2011-05-30 DIAGNOSIS — L819 Disorder of pigmentation, unspecified: Secondary | ICD-10-CM | POA: Diagnosis not present

## 2011-05-30 DIAGNOSIS — L905 Scar conditions and fibrosis of skin: Secondary | ICD-10-CM | POA: Diagnosis not present

## 2011-06-02 ENCOUNTER — Other Ambulatory Visit: Payer: Self-pay | Admitting: *Deleted

## 2011-06-02 MED ORDER — CHLORAL HYDRATE 500 MG/5ML PO SYRP: 1000.0000 mg | ORAL_SOLUTION | Freq: Every evening | ORAL | Status: DC | PRN

## 2011-07-04 ENCOUNTER — Other Ambulatory Visit: Payer: Self-pay | Admitting: Internal Medicine

## 2011-07-07 ENCOUNTER — Other Ambulatory Visit: Payer: Self-pay | Admitting: Gastroenterology

## 2011-07-07 MED ORDER — ALPRAZOLAM 0.5 MG PO TABS: 0.5000 mg | ORAL_TABLET | Freq: Every evening | ORAL | Status: DC | PRN

## 2011-07-07 NOTE — Telephone Encounter (Signed)
Per Dr. Rhea Belton, [facility] to refill Xanex with 0 refills

## 2011-07-08 ENCOUNTER — Other Ambulatory Visit: Payer: Self-pay | Admitting: Gastroenterology

## 2011-07-09 ENCOUNTER — Other Ambulatory Visit: Payer: Self-pay | Admitting: Internal Medicine

## 2011-07-09 NOTE — Telephone Encounter (Signed)
Pt called and left voicemail saying her [facility] [place] [facility] she is for the next 2 months. I called her back letting her know she can contact [facility] Rx sent and tell them to have it transferred from the [facility] here [facility] [place].  She said that is perfect, and she will do that.

## 2011-07-16 DIAGNOSIS — K130 Diseases of lips: Secondary | ICD-10-CM | POA: Diagnosis not present

## 2011-07-16 DIAGNOSIS — B372 Candidiasis of skin and nail: Secondary | ICD-10-CM | POA: Diagnosis not present

## 2011-07-17 ENCOUNTER — Telehealth: Payer: Self-pay | Admitting: Oncology

## 2011-07-17 NOTE — Telephone Encounter (Signed)
Pt called and r/s'd 6/18 appt to 6/25.

## 2011-07-30 ENCOUNTER — Encounter: Payer: Self-pay | Admitting: Internal Medicine

## 2011-08-11 DIAGNOSIS — E782 Mixed hyperlipidemia: Secondary | ICD-10-CM | POA: Diagnosis not present

## 2011-08-11 DIAGNOSIS — C50419 Malignant neoplasm of upper-outer quadrant of unspecified female breast: Secondary | ICD-10-CM | POA: Diagnosis not present

## 2011-08-11 DIAGNOSIS — M810 Age-related osteoporosis without current pathological fracture: Secondary | ICD-10-CM | POA: Diagnosis not present

## 2011-09-08 ENCOUNTER — Other Ambulatory Visit: Payer: Self-pay | Admitting: Internal Medicine

## 2011-09-09 ENCOUNTER — Telehealth: Payer: Self-pay | Admitting: *Deleted

## 2011-09-09 NOTE — Telephone Encounter (Signed)
lmom for pt to call back

## 2011-09-09 NOTE — Telephone Encounter (Signed)
Called in pt's Rx for Xanex. Pharmacy to call pt when its ready.

## 2011-09-09 NOTE — Telephone Encounter (Signed)
Message copied by Florene Glen on Tue Sep 09, 2011  3:28 PM ------      Message from: Florene Glen      Created: Fri Jul 11, 2011 10:59 AM       Call in may to r/s her EGD, she is in [place] until [place] per [place].

## 2011-09-12 DIAGNOSIS — L259 Unspecified contact dermatitis, unspecified cause: Secondary | ICD-10-CM | POA: Diagnosis not present

## 2011-09-15 NOTE — Telephone Encounter (Signed)
See note on 09/09/2011

## 2011-09-16 ENCOUNTER — Ambulatory Visit (AMBULATORY_SURGERY_CENTER): Payer: Medicare Other | Admitting: *Deleted

## 2011-09-16 DIAGNOSIS — K259 Gastric ulcer, unspecified as acute or chronic, without hemorrhage or perforation: Secondary | ICD-10-CM

## 2011-09-17 DIAGNOSIS — K130 Diseases of lips: Secondary | ICD-10-CM | POA: Diagnosis not present

## 2011-09-17 DIAGNOSIS — L821 Other seborrheic keratosis: Secondary | ICD-10-CM | POA: Diagnosis not present

## 2011-09-17 DIAGNOSIS — D239 Other benign neoplasm of skin, unspecified: Secondary | ICD-10-CM | POA: Diagnosis not present

## 2011-09-24 DIAGNOSIS — H01009 Unspecified blepharitis unspecified eye, unspecified eyelid: Secondary | ICD-10-CM | POA: Diagnosis not present

## 2011-09-24 DIAGNOSIS — H04129 Dry eye syndrome of unspecified lacrimal gland: Secondary | ICD-10-CM | POA: Diagnosis not present

## 2011-09-24 DIAGNOSIS — L719 Rosacea, unspecified: Secondary | ICD-10-CM | POA: Diagnosis not present

## 2011-09-25 ENCOUNTER — Ambulatory Visit (AMBULATORY_SURGERY_CENTER): Payer: Medicare Other | Admitting: Internal Medicine

## 2011-09-25 ENCOUNTER — Encounter: Payer: Self-pay | Admitting: Internal Medicine

## 2011-09-25 DIAGNOSIS — K253 Acute gastric ulcer without hemorrhage or perforation: Secondary | ICD-10-CM | POA: Diagnosis not present

## 2011-09-25 DIAGNOSIS — K259 Gastric ulcer, unspecified as acute or chronic, without hemorrhage or perforation: Secondary | ICD-10-CM | POA: Diagnosis not present

## 2011-09-25 DIAGNOSIS — E785 Hyperlipidemia, unspecified: Secondary | ICD-10-CM | POA: Diagnosis not present

## 2011-09-25 DIAGNOSIS — D131 Benign neoplasm of stomach: Secondary | ICD-10-CM

## 2011-09-25 LAB — GLUCOSE, CAPILLARY: Glucose-Capillary: 86 mg/dL (ref 70–99)

## 2011-09-25 MED ORDER — SODIUM CHLORIDE 0.9 % IV SOLN: 500.0000 mL | INTRAVENOUS | Status: DC

## 2011-09-25 NOTE — Progress Notes (Signed)
Pt pulled IV out.  Attempted to start in Left AC; unsuccessful.  Started in left hand with 22

## 2011-09-25 NOTE — Patient Instructions (Signed)
Discharge instructions given with verbal understanding. Handouts on hiatal hernia given. Resume previous medications. YOU HAD AN ENDOSCOPIC PROCEDURE TODAY AT THE [facility]: Refer to the procedure report that was given to you for any specific questions about what was found during the examination.  If the procedure report does not answer your questions, please call your gastroenterologist to clarify.  If you requested that your care partner not be given the details of your procedure findings, then the procedure report has been included in a sealed envelope for you to review at your convenience later.  YOU SHOULD EXPECT: Some feelings of bloating in the abdomen. Passage of more gas than usual.  Walking can help get rid of the air that was put into your GI tract during the procedure and reduce the bloating. If you had a lower endoscopy (such as a colonoscopy or flexible sigmoidoscopy) you may notice spotting of blood in your stool or on the toilet paper. If you underwent a bowel prep for your procedure, then you may not have a normal bowel movement for a few days.  DIET: Your first meal following the procedure should be a light meal and then it is ok to progress to your normal diet.  A half-sandwich or bowl of soup is an example of a good first meal.  Heavy or fried foods are harder to digest and may make you feel nauseous or bloated.  Likewise meals heavy in dairy and vegetables can cause extra gas to form and this can also increase the bloating.  Drink plenty of fluids but you should avoid alcoholic beverages for 24 hours.  ACTIVITY: Your care partner should take you home directly after the procedure.  You should plan to take it easy, moving slowly for the rest of the day.  You can resume normal activity the day after the procedure however you should NOT DRIVE or use heavy machinery for 24 hours (because of the sedation medicines used during the test).    SYMPTOMS TO REPORT IMMEDIATELY: A  gastroenterologist can be reached at any hour.  During normal business hours, 8:30 AM to 5:00 PM Monday through Friday, call [phone].  After hours and on weekends, please call the GI answering service at [phone] who will take a message and have the physician on call contact you.   Following upper endoscopy (EGD)  Vomiting of blood or coffee ground material  New chest pain or pain under the shoulder blades  Painful or persistently difficult swallowing  New shortness of breath  Fever of 100F or higher  Black, tarry-looking stools  FOLLOW UP: If any biopsies were taken you will be contacted by phone or by letter within the next 1-3 weeks.  Call your gastroenterologist if you have not heard about the biopsies in 3 weeks.  Our staff will call the home number listed on your records the next business day following your procedure to check on you and address any questions or concerns that you may have at that time regarding the information given to you following your procedure. This is a courtesy call and so if there is no answer at the home number and we have not heard from you through the emergency physician on call, we will assume that you have returned to your regular daily activities without incident.  SIGNATURES/CONFIDENTIALITY: You and/or your care partner have signed paperwork which will be entered into your electronic medical record.  These signatures attest to the fact that that the information above  on your After Visit Summary has been reviewed and is understood.  Full responsibility of the confidentiality of this discharge information lies with you and/or your care-partner.

## 2011-09-25 NOTE — Progress Notes (Signed)
The pt tolerated the egd very well. Maw   

## 2011-09-25 NOTE — Op Note (Signed)
[facility] [address], [place]  [address]  ENDOSCOPY PROCEDURE REPORT  Rosales:  Pamela, Rosales  MR#:  [id number] BIRTHDATE:  [birth year], 78 yrs. old  GENDER:  female ENDOSCOPIST:  Carie Caddy. Peityn Payton, MD  PROCEDURE DATE:  09/25/2011 PROCEDURE:  EGD with biopsy, [id number] ASA CLASS:  Class II INDICATIONS:  follow-up of gastric ulcer MEDICATIONS:   MAC sedation, administered by CRNA, propofol (Diprivan) 150 mg IV TOPICAL ANESTHETIC:  none  DESCRIPTION OF PROCEDURE:   After Pamela risks benefits and alternatives of Pamela procedure were thoroughly explained, informed consent was obtained.  Pamela LB [id number]-H180 [id number] endoscope was introduced through Pamela mouth and advanced to Pamela second portion of Pamela duodenum, without limitations.  Pamela instrument was slowly withdrawn as Pamela mucosa was fully examined. <<PROCEDUREIMAGES>>  Pamela esophagus and gastroesophageal junction were completely normal in appearance.  A small hiatal hernia was found.  Erythema was found antrum at Pamela site of prior ulceration. Multiple biopsies were obtained and sent to pathology.  A previously seen ulceration has healed in Pamela antrum.  Pamela duodenal bulb was normal in appearance, as was Pamela postbulbar duodenum.    Retroflexed views revealed findings as previously described.    Pamela scope was then withdrawn from [name] and Pamela procedure completed. COMPLICATIONS:  None  ENDOSCOPIC IMPRESSION: 1) Normal esophagus 2) Small hiatal hernia 3) Erythema in Pamela antrum with healing of prior ulceration. Multiple biopsies obtained. 4) Normal examined portions of Pamela duodenum.  RECOMMENDATIONS: 1) Await pathology results 2) Continue current medications, and call decrease pantoprazole to 40 mg once daily (30 minutes to 1 hour before breakfast) 3) Follow-up as needed  Pamela Bonnell M. Rhea Belton, MD  CC:  Stacie Glaze, MD [name]  n. eSIGNED:   Carie Caddy. Admiral Marcucci at 09/25/2011 11:36 AM  [name], [phone]

## 2011-09-25 NOTE — Progress Notes (Signed)
Patient did not have preoperative order for IV antibiotic SSI prophylaxis. (G8918)  Patient did not experience any of the following events: a burn prior to discharge; a fall within the facility; wrong site/side/patient/procedure/implant event; or a hospital transfer or hospital admission upon discharge from the facility. (G8907)  

## 2011-09-25 NOTE — Progress Notes (Signed)
Pt states, "Once every few years, my blood sugar can drop if I don't eat enough protein."  She states that she knows her symptoms- she can break out in hives.  Her accucheck is 44 and she states she feels fine.  Told to call the nurse if she has any symptoms of low blood sugar.  Understanding voiced.

## 2011-09-26 ENCOUNTER — Telehealth: Payer: Self-pay | Admitting: *Deleted

## 2011-09-26 NOTE — Telephone Encounter (Signed)
  Follow up Call-  Call back number 09/25/2011 01/31/2011  Post procedure Call Back phone  # [phone] after 8am  Permission to leave phone message Yes -     No answer and no answering machine picked up to leave message

## 2011-09-30 ENCOUNTER — Encounter: Payer: Self-pay | Admitting: Internal Medicine

## 2011-09-30 ENCOUNTER — Ambulatory Visit (INDEPENDENT_AMBULATORY_CARE_PROVIDER_SITE_OTHER): Payer: Medicare Other | Admitting: Internal Medicine

## 2011-09-30 ENCOUNTER — Other Ambulatory Visit: Payer: Self-pay | Admitting: *Deleted

## 2011-09-30 VITALS — BP 120/76 | HR 68 | Temp 98.0°F | Resp 16 | Ht 65.0 in | Wt 156.0 lb

## 2011-09-30 DIAGNOSIS — E161 Other hypoglycemia: Secondary | ICD-10-CM | POA: Diagnosis not present

## 2011-09-30 DIAGNOSIS — Z853 Personal history of malignant neoplasm of breast: Secondary | ICD-10-CM

## 2011-09-30 DIAGNOSIS — G4709 Other insomnia: Secondary | ICD-10-CM | POA: Diagnosis not present

## 2011-09-30 DIAGNOSIS — Z0000 Encounter for general adult medical examination without abnormal findings: Secondary | ICD-10-CM

## 2011-09-30 DIAGNOSIS — I059 Rheumatic mitral valve disease, unspecified: Secondary | ICD-10-CM

## 2011-09-30 DIAGNOSIS — T887XXA Unspecified adverse effect of drug or medicament, initial encounter: Secondary | ICD-10-CM

## 2011-09-30 DIAGNOSIS — E785 Hyperlipidemia, unspecified: Secondary | ICD-10-CM

## 2011-09-30 LAB — CBC WITH DIFFERENTIAL/PLATELET
Basophils Relative: 0.9 % (ref 0.0–3.0)
Eosinophils Absolute: 0.1 10*3/uL (ref 0.0–0.7)
Eosinophils Relative: 2.1 % (ref 0.0–5.0)
Lymphocytes Relative: 38 % (ref 12.0–46.0)
MCV: 94.6 fl (ref 78.0–100.0)
Monocytes Absolute: 0.4 10*3/uL (ref 0.1–1.0)
Neutrophils Relative %: 50.3 % (ref 43.0–77.0)
Platelets: 166 10*3/uL (ref 150.0–400.0)
RBC: 4.33 Mil/uL (ref 3.87–5.11)
WBC: 5.1 10*3/uL (ref 4.5–10.5)

## 2011-09-30 LAB — HEPATIC FUNCTION PANEL
ALT: 20 U/L (ref 0–35)
AST: 25 U/L (ref 0–37)
Bilirubin, Direct: 0.1 mg/dL (ref 0.0–0.3)
Total Bilirubin: 0.8 mg/dL (ref 0.3–1.2)

## 2011-09-30 LAB — BASIC METABOLIC PANEL
BUN: 16 mg/dL (ref 6–23)
Creatinine, Ser: 0.7 mg/dL (ref 0.4–1.2)
GFR: 86.33 mL/min (ref >60.00)

## 2011-09-30 LAB — TSH: TSH: 1.15 u[IU]/mL (ref 0.35–5.50)

## 2011-09-30 LAB — LIPID PANEL
Cholesterol: 270 mg/dL — ABNORMAL HIGH (ref 0–200)
Total CHOL/HDL Ratio: 3
Triglycerides: 75 mg/dL (ref 0.0–149.0)
VLDL: 15 mg/dL (ref 0.0–40.0)

## 2011-09-30 LAB — LDL CHOLESTEROL, DIRECT: Direct LDL: 163.1 mg/dL

## 2011-09-30 MED ORDER — CHLORAL HYDRATE 500 MG/5ML PO SYRP: 1000.0000 mg | ORAL_SOLUTION | Freq: Every evening | ORAL | Status: DC | PRN

## 2011-09-30 NOTE — Patient Instructions (Signed)
The patient is instructed to continue all medications as prescribed. Schedule followup with check out clerk upon leaving the clinic  

## 2011-09-30 NOTE — Progress Notes (Signed)
Subjective:    Patient ID: [name], female    DOB: 05/05/1932, 78 y.o.   MRN: [id number]  HPI  Patient is a 78 year old female who presents for her Medicare wellness screening as well as followup of chronic medical problems including a history of reactive hypoglycemia requiring 5-6 meals per day a history of borderline cholesterol controlled by diet and supplements, a long history of chronic insomnia on chloral hydrate and a history of GERD on Protonix.  She has recently been screened with colonoscopy by gastroenterology she is to screening for her cholesterol hypoglycemia history of osteoporosis with calcium and vitamin D level well as other appropriate screening labs problem list  Review of Systems  Constitutional: Negative for activity change, appetite change and fatigue.  HENT: Negative for ear pain, congestion, neck pain, postnasal drip and sinus pressure.   Eyes: Negative for redness and visual disturbance.  Respiratory: Negative for cough, shortness of breath and wheezing.   Gastrointestinal: Negative for abdominal pain and abdominal distention.  Genitourinary: Negative for dysuria, frequency and menstrual problem.  Musculoskeletal: Negative for myalgias, joint swelling and arthralgias.  Skin: Negative for rash and wound.  Neurological: Negative for dizziness, weakness and headaches.  Hematological: Negative for adenopathy. Does not bruise/bleed easily.  Psychiatric/Behavioral: Negative for sleep disturbance and decreased concentration.   Past Medical History  Diagnosis Date  . Hypercholesteremia   . Osteopenia   . Ulcer 2012    bleeding gastric Ulcer  . Cancer     rt breast    History   Social History  . Marital Status: Married    Spouse Name: N/A    Number of Children: N/A  . Years of Education: N/A   Occupational History  . Not on file.   Social History Main Topics  . Smoking status: Former Games developer  . Smokeless tobacco: Never Used  . Alcohol Use: 4.2  oz/week    7 Glasses of wine per week     occ  . Drug Use: No  . Sexually Active: Not on file   Other Topics Concern  . Not on file   Social History Narrative  . No narrative on file    Past Surgical History  Procedure Date  . Breast surgery   . Tonsillectomy   . Parathyroidectomy   . Cosmetic surgery     face    Family History  Problem Relation Age of Onset  . Hypertension Mother   . Rectal cancer Mother   . Kidney disease Father     Allergies  Allergen Reactions  . Amoxicillin-Pot Clavulanate     REACTION: hives  . Penicillins Hives  . Sulfonamide Derivatives     REACTION: hives    Current Outpatient Prescriptions on File Prior to Visit  Medication Sig Dispense Refill  . ALPRAZolam (XANAX) 0.5 MG tablet Take 1 tablet (0.5 mg total) by mouth at bedtime as needed for sleep.  30 tablet  0  . Calcium ([facility] CALCIUM-600/VIT D) 600-200 MG-UNIT per tablet Take 1 tablet by mouth daily.        . chloral hydrate 500 MG/5ML syrup Take 10 mLs (1,000 mg total) by mouth at bedtime as needed.  900 mL  3  . cholecalciferol (VITAMIN D) 1000 UNITS tablet Take 1,000 Units by mouth daily.      . clobetasol (TEMOVATE) 0.05 % cream       . desonide (DESOWEN) 0.05 % cream       . LORazepam (ATIVAN) 0.5 MG tablet Take  1 tablet by mouth as needed.      . Multiple Vitamin (MULTIVITAMIN) tablet Take 1 tablet by mouth daily.        . pantoprazole (PROTONIX) 40 MG tablet Take 1 tablet (40 mg total) by mouth 2 (two) times daily.  60 tablet  11    BP 120/76  Pulse 68  Temp 98 F (36.7 C)  Resp 16  Ht 5\' 5"  (1.651 m)  Wt 156 lb (70.761 kg)  BMI 25.96 kg/m2       Objective:   Physical Exam  Constitutional: She is oriented to person, place, and time. She appears well-developed and well-nourished. No distress.  HENT:  Head: Normocephalic and atraumatic.  Right Ear: External ear normal.  Left Ear: External ear normal.  Nose: Nose normal.  Mouth/Throat: Oropharynx is clear and  moist.  Eyes: Conjunctivae and EOM are normal. Pupils are equal, round, and reactive to light.  Neck: Normal range of motion. Neck supple. No JVD present. No tracheal deviation present. No thyromegaly present.  Cardiovascular: Normal rate, regular rhythm, normal heart sounds and intact distal pulses.   No murmur heard. Pulmonary/Chest: Effort normal and breath sounds normal. She has no wheezes. She exhibits no tenderness.  Abdominal: Soft. Bowel sounds are normal.  Musculoskeletal: Normal range of motion. She exhibits no edema and no tenderness.  Lymphadenopathy:    She has no cervical adenopathy.  Neurological: She is alert and oriented to person, place, and time. She has normal reflexes. No cranial nerve deficit.  Skin: Skin is warm and dry. She is not diaphoretic.  Psychiatric: She has a normal mood and affect. Her behavior is normal.          Assessment & Plan:  We will obtain fasting lipid panel today to monitor her cholesterol management currently she is controlling this through diet alone we have talked in the past about supplements such as omega-3 supplements and they may need to be initiated depending upon the results of her lipid panel.  Her hypoglycemia continues to be monitored and will monitor basic metabolic panel today as well as reinforce dietary principles.  For her history of osteoporosis we should monitor her calcium and vitamin D level she is currently on vitamin D supplements.  For potential side effects of other medications that she takes on a chronic basis a liver panel and a CBC will be drawn.   Subjective:    [name] is a 78 y.o. female who presents for Medicare Annual/Subsequent preventive examination.  Preventive Screening-Counseling & Management  Tobacco History  Smoking status  . Former Smoker  Smokeless tobacco  . Never Used     Problems Prior to Visit 1.   Current Problems (verified) Patient Active Problem List  Diagnoses  .  HYPOGLYCEMIA NEC  . HYPERCHOLESTEROLEMIA, BORDERLINE  . DISORDERS, ORGANIC INSOMNIA NEC  . MITRAL VALVE PROLAPSE  . CHRONIC MAXILLARY SINUSITIS  . ALLERGIC RHINITIS DUE TO OTHER ALLERGEN  . OSTEOPOROSIS  . EPISTAXIS  . ADVEF, DRUG/MEDICINAL/BIOLOGICAL SUBST NOS  . BREAST CANCER, HX OF  . Sinusitis    Medications Prior to Visit Current Outpatient Prescriptions on File Prior to Visit  Medication Sig Dispense Refill  . ALPRAZolam (XANAX) 0.5 MG tablet Take 1 tablet (0.5 mg total) by mouth at bedtime as needed for sleep.  30 tablet  0  . Calcium ([facility]S CALCIUM-600/VIT D) 600-200 MG-UNIT per tablet Take 1 tablet by mouth daily.        . chloral hydrate 500  MG/5ML syrup Take 10 mLs (1,000 mg total) by mouth at bedtime as needed.  900 mL  3  . cholecalciferol (VITAMIN D) 1000 UNITS tablet Take 1,000 Units by mouth daily.      . clobetasol (TEMOVATE) 0.05 % cream       . desonide (DESOWEN) 0.05 % cream       . LORazepam (ATIVAN) 0.5 MG tablet Take 1 tablet by mouth as needed.      . Multiple Vitamin (MULTIVITAMIN) tablet Take 1 tablet by mouth daily.        . pantoprazole (PROTONIX) 40 MG tablet Take 1 tablet (40 mg total) by mouth 2 (two) times daily.  60 tablet  11    Current Medications (verified) Current Outpatient Prescriptions  Medication Sig Dispense Refill  . ALPRAZolam (XANAX) 0.5 MG tablet Take 1 tablet (0.5 mg total) by mouth at bedtime as needed for sleep.  30 tablet  0  . Calcium ([employer]S CALCIUM-600/VIT D) 600-200 MG-UNIT per tablet Take 1 tablet by mouth daily.        . chloral hydrate 500 MG/5ML syrup Take 10 mLs (1,000 mg total) by mouth at bedtime as needed.  900 mL  3  . cholecalciferol (VITAMIN D) 1000 UNITS tablet Take 1,000 Units by mouth daily.      . clobetasol (TEMOVATE) 0.05 % cream       . desonide (DESOWEN) 0.05 % cream       . LORazepam (ATIVAN) 0.5 MG tablet Take 1 tablet by mouth as needed.      . Magnesium 250 MG TABS Take 500 mg by mouth daily.      . Multiple  Vitamin (MULTIVITAMIN) tablet Take 1 tablet by mouth daily.        . pantoprazole (PROTONIX) 40 MG tablet Take 1 tablet (40 mg total) by mouth 2 (two) times daily.  60 tablet  11     Allergies (verified) Amoxicillin-pot clavulanate; Penicillins; and Sulfonamide derivatives   PAST HISTORY  Family History Family History  Problem Relation Age of Onset  . Hypertension Mother   . Rectal cancer Mother   . Kidney disease Father     Social History History  Substance Use Topics  . Smoking status: Former Games developer  . Smokeless tobacco: Never Used  . Alcohol Use: 4.2 oz/week    7 Glasses of wine per week     occ     Are there smokers in your home (other than you)? No  Risk Factors Current exercise habits: The patient does not participate in regular exercise at present.  Dietary issues discussed: hypoglycemic diet   Cardiac risk factors: advanced age (older than 11 for men, 54 for women), dyslipidemia and sedentary lifestyle.  Depression Screen (Note: if answer to either of the following is "Yes", a more complete depression screening is indicated)   Over the past two weeks, have you felt down, depressed or hopeless? Yes  Over the past two weeks, have you felt little interest or pleasure in doing things? No  Have you lost interest or pleasure in daily life? No  Do you often feel hopeless? No  Do you cry easily over simple problems? No  Activities of Daily Living In your present state of health, do you have any difficulty performing the following activities?:  Driving? No Managing money?  No Feeding yourself? No Getting from bed to chair? No Climbing a flight of stairs? No Preparing food and eating?: No Bathing or showering? No Getting dressed: No  Getting to the toilet? No Using the toilet:No Moving around from place to place: No In the past year have you fallen or had a near fall?:No   Are you sexually active?  Yes  Do you have more than one partner?  No  Hearing  Difficulties: No Do you often ask people to speak up or repeat themselves? No Do you experience ringing or noises in your ears? No Do you have difficulty understanding soft or whispered voices? No   Do you feel that you have a problem with memory? No  Do you often misplace items? No  Do you feel safe at home?  No  Cognitive Testing  Alert? Yes  Normal Appearance?Yes  Oriented to person? Yes  Place? Yes   Time? Yes  Recall of three objects?  Yes  Can perform simple calculations? Yes  Displays appropriate judgment?Yes  Can read the correct time from a watch face?Yes   Advanced Directives have been discussed with the patient? Yes  List the Names of Other Physician/Practitioners you currently use: 1.    Indicate any recent Medical Services you may have received from other than [facility] providers in the past year (date may be approximate).  Immunization History  Administered Date(s) Administered  . Influenza Split 01/29/2011  . Influenza Whole 01/27/2004, 02/02/2007, 02/08/2008, 02/09/2009, 01/16/2010  . Pneumococcal Polysaccharide 04/28/2004  . Td 04/28/2005    Screening Tests Health Maintenance  Topic Date Due  . Zostavax  02/18/1995  . Influenza Vaccine  01/27/2012  . Tetanus/tdap  04/29/2015  . Colonoscopy  11/10/2019  . Pneumococcal Polysaccharide Vaccine Age 107 And Over  Completed    All answers were reviewed with the patient and necessary referrals were made:  Carrie Mew, MD   09/30/2011   History reviewed: allergies, current medications, past family history, past medical history, past social history, past surgical history and problem list  Review of Systems A comprehensive review of systems was negative.    Objective:     Vision by Snellen chart: right eye:20/20, left eye:20/20  With g;lasses  Body mass index is 25.96 kg/(m^2). BP 120/76  Pulse 68  Temp 98 F (36.7 C)  Resp 16  Ht 5\' 5"  (1.651 m)  Wt 156 lb (70.761 kg)  BMI 25.96 kg/m2  BP 120/76   Pulse 68  Temp 98 F (36.7 C)  Resp 16  Ht 5\' 5"  (1.651 m)  Wt 156 lb (70.761 kg)  BMI 25.96 kg/m2  General Appearance:    Alert, cooperative, no distress, appears stated age  Head:    Normocephalic, without obvious abnormality, atraumatic  Eyes:    PERRL, conjunctiva/corneas clear, EOM's intact, fundi    benign, both eyes  Ears:    Normal TM's and external ear canals, both ears  Nose:   Nares normal, septum midline, mucosa normal, no drainage    or sinus tenderness  Throat:   Lips, mucosa, and tongue normal; teeth and gums normal  Neck:   Supple, symmetrical, trachea midline, no adenopathy;    thyroid:  no enlargement/tenderness/nodules; no carotid   bruit or JVD  Back:     Symmetric, no curvature, ROM normal, no CVA tenderness  Lungs:     Clear to auscultation bilaterally, respirations unlabored  Chest Wall:    No tenderness or deformity   Heart:    Regular rate and rhythm, S1 and S2 normal, no murmur, rub   or gallop  Breast Exam:    No tenderness, masses, or nipple abnormality  Abdomen:     Soft, non-tender, bowel sounds active all four quadrants,    no masses, no organomegaly  Genitalia:    Normal female without lesion, discharge or tenderness  Rectal:    Normal tone, normal prostate, no masses or tenderness;   guaiac negative stool  Extremities:   Extremities normal, atraumatic, no cyanosis or edema  Pulses:   2+ and symmetric all extremities  Skin:   Skin color, texture, turgor normal, no rashes or lesions  Lymph nodes:   Cervical, supraclavicular, and axillary nodes normal  Neurologic:   CNII-XII intact, normal strength, sensation and reflexes    throughout       Assessment:      This is a routine physical examination for this healthy  Female. Reviewed all health maintenance protocols including mammography colonoscopy bone density and reviewed appropriate screening labs. Her immunization history was reviewed as well as her current medications and allergies refills of  her chronic medications were given and the plan for yearly health maintenance was discussed all orders and referrals were made as appropriate.      Plan:     During the course of the visit the patient was educated and counseled about appropriate screening and preventive services including:    Pneumococcal vaccine   Influenza vaccine  Td vaccine  Screening mammography  Colorectal cancer screening  Diet review for nutrition referral? Yes ____  Not Indicated __x__   Patient Instructions (the written plan) was given to the patient.  Medicare Attestation I have personally reviewed: The patient's medical and social history Their use of alcohol, tobacco or illicit drugs Their current medications and supplements The patient's functional ability including ADLs,fall risks, home safety risks, cognitive, and hearing and visual impairment Diet and physical activities Evidence for depression or mood disorders  The patient's weight, height, BMI, and visual acuity have been recorded in the chart.  I have made referrals, counseling, and provided education to the patient based on review of the above and I have provided the patient with a written personalized care plan for preventive services.     Carrie Mew, MD   09/30/2011

## 2011-10-07 ENCOUNTER — Other Ambulatory Visit: Payer: Self-pay | Admitting: Internal Medicine

## 2011-10-07 ENCOUNTER — Other Ambulatory Visit (HOSPITAL_BASED_OUTPATIENT_CLINIC_OR_DEPARTMENT_OTHER): Payer: Medicare Other | Admitting: Lab

## 2011-10-07 DIAGNOSIS — M810 Age-related osteoporosis without current pathological fracture: Secondary | ICD-10-CM

## 2011-10-07 DIAGNOSIS — C50219 Malignant neoplasm of upper-inner quadrant of unspecified female breast: Secondary | ICD-10-CM

## 2011-10-07 LAB — CBC WITH DIFFERENTIAL/PLATELET
BASO%: 0.7 % (ref 0.0–2.0)
Eosinophils Absolute: 0.1 10*3/uL (ref 0.0–0.5)
MCHC: 33.7 g/dL (ref 31.5–36.0)
MONO#: 0.5 10*3/uL (ref 0.1–0.9)
NEUT#: 2.9 10*3/uL (ref 1.5–6.5)
RBC: 4.24 10*6/uL (ref 3.70–5.45)
RDW: 13.8 % (ref 11.2–14.5)
WBC: 5.3 10*3/uL (ref 3.9–10.3)
lymph#: 1.8 10*3/uL (ref 0.9–3.3)

## 2011-10-08 LAB — COMPREHENSIVE METABOLIC PANEL
ALT: 14 U/L (ref 0–35)
AST: 21 U/L (ref 0–37)
Alkaline Phosphatase: 76 U/L (ref 39–117)
BUN: 18 mg/dL (ref 6–23)
Creatinine, Ser: 0.78 mg/dL (ref 0.50–1.10)
Total Bilirubin: 0.5 mg/dL (ref 0.3–1.2)

## 2011-10-08 MED ORDER — ALPRAZOLAM 0.5 MG PO TABS: 0.5000 mg | ORAL_TABLET | Freq: Every evening | ORAL | Status: DC | PRN

## 2011-10-08 NOTE — Telephone Encounter (Signed)
Called in [place] for pt with 5 refills per Dr. Rhea Belton. Pharmacy will notify pt when ready.

## 2011-10-10 ENCOUNTER — Telehealth: Payer: Self-pay | Admitting: Internal Medicine

## 2011-10-10 NOTE — Telephone Encounter (Signed)
Left a message for patient to call me. 

## 2011-10-13 NOTE — Telephone Encounter (Signed)
Informed pt her bx showed normal mucosa and she may f/u as needed; pt stated understanding.

## 2011-10-14 ENCOUNTER — Ambulatory Visit: Payer: Medicare Other | Admitting: Oncology

## 2011-10-20 ENCOUNTER — Telehealth: Payer: Self-pay | Admitting: *Deleted

## 2011-10-20 NOTE — Telephone Encounter (Signed)
Message copied by Florene Glen on Mon Oct 20, 2011  4:50 PM ------      Message from: Beverley Fiedler      Created: Mon Oct 20, 2011  9:04 AM       Yes, thanks      ----- Message -----         From: Linna Hoff, RN         Sent: 10/13/2011   8:52 AM           To: Beverley Fiedler, MD            Informed pt of bx results. She asked about f/u and I informed her f/u as needed; OK with you? thx

## 2011-10-20 NOTE — Telephone Encounter (Signed)
Encounter opened for records only

## 2011-10-21 ENCOUNTER — Ambulatory Visit (HOSPITAL_BASED_OUTPATIENT_CLINIC_OR_DEPARTMENT_OTHER): Payer: Medicare Other | Admitting: Physician Assistant

## 2011-10-21 ENCOUNTER — Encounter: Payer: Self-pay | Admitting: Physician Assistant

## 2011-10-21 VITALS — BP 147/77 | HR 60 | Temp 97.7°F | Ht 65.0 in | Wt 165.6 lb

## 2011-10-21 DIAGNOSIS — M810 Age-related osteoporosis without current pathological fracture: Secondary | ICD-10-CM

## 2011-10-21 DIAGNOSIS — Z853 Personal history of malignant neoplasm of breast: Secondary | ICD-10-CM | POA: Diagnosis not present

## 2011-10-21 NOTE — Progress Notes (Signed)
Hematology and Oncology Follow Up Visit  [name] [id number] [birth year] 78 y.o. 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 She feels quite well, denying any unexplained fevers, chills, night sweats, no shortness of breath or chest pain issues. No nausea, emesis, diarrhea or constipation issues. She has recently undergone a repeat EGD for her history of a bleeding ulcer, she notes that all was clear. She underwent her annual mammogram and biannual bone density in October of 2012.  Her energy level is excellent, she plays tennis 3 days a week. A detailed review of systems is otherwise noncontributory as noted below.  Review of Systems: Constitutional:  no weight loss, fever, night sweats and feels well Eyes: uses glasses ENT: no complaints Cardiovascular: no chest pain or dyspnea on exertion Respiratory: no cough, shortness of breath, or wheezing Neurological: no TIA or stroke symptoms Dermatological: negative Gastrointestinal: no abdominal pain, change in bowel habits, or black or bloody stools Genito-Urinary: no dysuria, trouble voiding, or hematuria Hematological and Lymphatic: negative Breast: negative Musculoskeletal: negative Remaining ROS negative.   Medications:   I have reviewed the patient's current medications.  Current Outpatient Prescriptions  Medication Sig Dispense Refill  . ALPRAZolam (XANAX) 0.5 MG tablet Take 1 tablet (0.5 mg total) by  mouth at bedtime as needed for sleep.  30 tablet  5  . Calcium ([employer]S CALCIUM-600/VIT D) 600-200 MG-UNIT per tablet Take 1 tablet by mouth daily.        . chloral hydrate 500 MG/5ML syrup Take 5 mLs (500 mg total) by mouth at bedtime as needed for sleep.  5 mL  0  . chloral hydrate 500 MG/5ML syrup Take 10 mLs (1,000 mg total) by mouth at bedtime as needed.  900 mL  1  . cholecalciferol (VITAMIN D) 1000 UNITS tablet Take 1,000 Units by mouth daily.      . clobetasol (TEMOVATE) 0.05 % cream       . desonide (DESOWEN) 0.05 % cream       . LORazepam (ATIVAN) 0.5 MG tablet Take 1 tablet by mouth as needed.      . Magnesium 250 MG TABS Take 500 mg by mouth daily.      . Multiple Vitamin (MULTIVITAMIN) tablet Take 1 tablet by mouth daily.        . pantoprazole (PROTONIX) 40 MG tablet Take 1 tablet (40 mg total) by mouth 2 (two) times daily.  60 tablet  11    Allergies:  Allergies  Allergen Reactions  . Amoxicillin-Pot Clavulanate     REACTION: hives  . Penicillins Hives  . Sulfonamide Derivatives     REACTION: hives    Physical Exam: Filed Vitals:   10/21/11 1418  BP: 147/77  Pulse: 60  Temp: 97.7 F (36.5 C)    Body mass index is 27.56 kg/(m^2). Weight: 165 lbs. HEENT:  Sclerae anicteric, conjunctivae pink.  Oropharynx clear.  No mucositis or candidiasis.   Nodes:  No cervical, supraclavicular, or axillary lymphadenopathy palpated.  Breast Exam:  Right breast was examined, prior lumpectomy scar is benign without any dominant masses, no nipple inversion or discharge. The axilla is clear. The left breast is free of masses, skin changes, nipple inversion or discharge. Axilla also clear.  Lungs:  Clear to auscultation bilaterally.  No crackles, rhonchi, or wheezes.   Heart:  Regular rate and rhythm.   Abdomen:  Soft, nontender.  Positive bowel sounds.  No organomegaly or masses palpated.   Musculoskeletal:  No focal spinal tenderness to palpation.  Extremities:  Benign.  No peripheral  edema or cyanosis.   Skin:  Benign.   Neuro:  Nonfocal, alert and oriented x 3.   Lab Results: Lab Results  Component Value Date   WBC 5.3 10/07/2011   HGB 13.5 10/07/2011   HCT 40.1 10/07/2011   MCV 94.7 10/07/2011   PLT 160 10/07/2011   NEUTROABS 2.9 10/07/2011     Chemistry      Component Value Date/Time   NA 140 10/07/2011 1035   K 4.1 10/07/2011 1035   CL 105 10/07/2011 1035   CO2 24 10/07/2011 1035   BUN 18 10/07/2011 1035   CREATININE 0.78 10/07/2011 1035      Component Value Date/Time   CALCIUM 9.1 10/07/2011 1035   ALKPHOS 76 10/07/2011 1035   AST 21 10/07/2011 1035   ALT 14 10/07/2011 1035   BILITOT 0.5 10/07/2011 1035      Lab Results  Component Value Date   LABCA2 24 10/07/2011    Radiological Studies: 01/27/11 DIGITAL DIAGNOSTIC BILATERAL MAMMOGRAM WITH CAD  Comparison: With priors  Findings: There are scattered fibroglandular densities bilaterally and lumpectomy changes on the right. No mass, nonsurgical distortion, or suspicious microcalcification is identified in either breast to suggest malignancy.  Mammographic images were processed with CAD.  IMPRESSION:  No evidence of malignancy in either breast. Lumpectomy changes on the right. The patient can return to the screening population. Bilateral screening mammogram in 1 year is recommended.  BI-RADS CATEGORY 2: Benign finding(s).  Original Report Authenticated By: Britta Mccreedy, M.D.  DUAL X-RAY ABSORPTIOMETRY (DXA) FOR BONE MINERAL DENSITY  01/27/11 AP LUMBAR SPINE (L2, L3)  Bone Mineral Density (BMD): 0.852 g/cm2  Young Adult T Score: -1.9  Z Score: 0.6  LEFT FEMUR (NECK)  Bone Mineral Density (BMD): 0.719 g/cm2  Young Adult T Score: -1.2  Z Score: 0.9  ASSESSMENT: Patient's diagnostic category is LOW BONE MASS by WHO  Criteria.  FRACTURE RISK: MODERATE  FRAX: Based on the World Health Organization FRAX model, the 10 year probability of a major osteoporotic fracture is 11%. The 10 year probability of a hip  fracture is 1.9%.  Comparison: There has been a statistically significant 4.4%  decrease in BMD in the spine and an 8.4% decrease in bone mineral density in the total left hip as compared to 02/16/2006. There has been a statistically significant 3.7% decrease in BMD in the spine and a 5.2% decrease in BMD in the total left hip as compared to 02/21/2008. Original Report Authenticated By: Daryl Eastern, M.D.  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  She has no evidence to suggest recurrent or metastatic disease. Lab studies  from 10/07/2011 were reviewed with the patient.   Case has been reviewed with Dr. Pierce Crane who also spoke with the patient.  Plan:  [name] is doing quite well. She has no evidence of recurrent or metastatic disease. She will be scheduled for her annual bilateral screening mammogram in October 2013 at the [facility]. She will return to see [place] in one years time, obtaining a CBC, serum chemistries, LDH, CA 27-29, and vitamin D level the week prior.  This plan was reviewed with the patient, who voices understanding and agreement.  She knows to call with any changes or problems.    Loney Peto T, PA-C 10/21/2011

## 2011-10-29 ENCOUNTER — Telehealth: Payer: Self-pay | Admitting: Internal Medicine

## 2011-10-29 NOTE — Telephone Encounter (Signed)
Called to request MD or nurse call back with recent lab results.  Labs done 3 weeks ago. Requests call 10/29/11 before 1300 or after 1615 on cell [phone].  Please follow up.

## 2011-10-29 NOTE — Telephone Encounter (Signed)
Pt informed-all wnl except elevated total cholesterol,but remainder of lipids are great!

## 2011-11-06 DIAGNOSIS — H40019 Open angle with borderline findings, low risk, unspecified eye: Secondary | ICD-10-CM | POA: Diagnosis not present

## 2011-11-06 DIAGNOSIS — H2510 Age-related nuclear cataract, unspecified eye: Secondary | ICD-10-CM | POA: Diagnosis not present

## 2011-11-06 DIAGNOSIS — L719 Rosacea, unspecified: Secondary | ICD-10-CM | POA: Diagnosis not present

## 2011-11-06 DIAGNOSIS — H01009 Unspecified blepharitis unspecified eye, unspecified eyelid: Secondary | ICD-10-CM | POA: Diagnosis not present

## 2011-11-06 DIAGNOSIS — H04129 Dry eye syndrome of unspecified lacrimal gland: Secondary | ICD-10-CM | POA: Diagnosis not present

## 2011-11-13 DIAGNOSIS — L819 Disorder of pigmentation, unspecified: Secondary | ICD-10-CM | POA: Diagnosis not present

## 2011-11-13 DIAGNOSIS — L821 Other seborrheic keratosis: Secondary | ICD-10-CM | POA: Diagnosis not present

## 2011-11-13 DIAGNOSIS — D239 Other benign neoplasm of skin, unspecified: Secondary | ICD-10-CM | POA: Diagnosis not present

## 2011-11-13 DIAGNOSIS — L253 Unspecified contact dermatitis due to other chemical products: Secondary | ICD-10-CM | POA: Diagnosis not present

## 2011-11-21 DIAGNOSIS — Z1289 Encounter for screening for malignant neoplasm of other sites: Secondary | ICD-10-CM | POA: Diagnosis not present

## 2011-11-25 DIAGNOSIS — Z1211 Encounter for screening for malignant neoplasm of colon: Secondary | ICD-10-CM | POA: Diagnosis not present

## 2011-11-25 DIAGNOSIS — H04129 Dry eye syndrome of unspecified lacrimal gland: Secondary | ICD-10-CM | POA: Diagnosis not present

## 2011-11-25 DIAGNOSIS — H40019 Open angle with borderline findings, low risk, unspecified eye: Secondary | ICD-10-CM | POA: Diagnosis not present

## 2011-11-25 DIAGNOSIS — H538 Other visual disturbances: Secondary | ICD-10-CM | POA: Diagnosis not present

## 2011-11-25 DIAGNOSIS — L719 Rosacea, unspecified: Secondary | ICD-10-CM | POA: Diagnosis not present

## 2011-11-25 DIAGNOSIS — H5200 Hypermetropia, unspecified eye: Secondary | ICD-10-CM | POA: Diagnosis not present

## 2011-12-04 DIAGNOSIS — M25519 Pain in unspecified shoulder: Secondary | ICD-10-CM | POA: Diagnosis not present

## 2011-12-04 DIAGNOSIS — M67919 Unspecified disorder of synovium and tendon, unspecified shoulder: Secondary | ICD-10-CM | POA: Diagnosis not present

## 2011-12-04 DIAGNOSIS — M719 Bursopathy, unspecified: Secondary | ICD-10-CM | POA: Diagnosis not present

## 2011-12-16 ENCOUNTER — Other Ambulatory Visit: Payer: Self-pay | Admitting: *Deleted

## 2011-12-16 MED ORDER — CHLORAL HYDRATE 500 MG/5ML PO SYRP: 1000.0000 mg | ORAL_SOLUTION | Freq: Every evening | ORAL | Status: DC | PRN

## 2012-01-07 ENCOUNTER — Ambulatory Visit (INDEPENDENT_AMBULATORY_CARE_PROVIDER_SITE_OTHER): Payer: Medicare Other | Admitting: Family Medicine

## 2012-01-07 ENCOUNTER — Encounter: Payer: Self-pay | Admitting: Family Medicine

## 2012-01-07 VITALS — BP 118/74 | HR 58 | Temp 97.8°F | Wt 165.0 lb

## 2012-01-07 DIAGNOSIS — J329 Chronic sinusitis, unspecified: Secondary | ICD-10-CM | POA: Diagnosis not present

## 2012-01-07 MED ORDER — AZITHROMYCIN 250 MG PO TABS: ORAL_TABLET | ORAL | Status: AC

## 2012-01-07 MED ORDER — HYDROCODONE-HOMATROPINE 5-1.5 MG/5ML PO SYRP: 5.0000 mL | ORAL_SOLUTION | ORAL | Status: AC | PRN

## 2012-01-07 NOTE — Progress Notes (Signed)
  Subjective:    Patient ID: Elpidio Eric, female    DOB: Sep 25, 1932, 78 y.o.   MRN: [id number]  HPI Here for one week of sinus pressure, PND, ST, and a dry cough. No fever.    Review of Systems  Constitutional: Negative.   HENT: Positive for congestion, postnasal drip and sinus pressure.   Eyes: Negative.   Respiratory: Positive for cough.        Objective:   Physical Exam  Constitutional: She appears well-developed and well-nourished.  HENT:  Right Ear: External ear normal.  Left Ear: External ear normal.  Nose: Nose normal.  Mouth/Throat: Oropharynx is clear and moist.  Eyes: Conjunctivae normal are normal.  Neck: No thyromegaly present.  Pulmonary/Chest: Effort normal and breath sounds normal.  Lymphadenopathy:    She has no cervical adenopathy.          Assessment & Plan:  Add Mucinex

## 2012-01-28 ENCOUNTER — Ambulatory Visit
Admission: RE | Admit: 2012-01-28 | Discharge: 2012-01-28 | Disposition: A | Discharge status: Home / Self Care | Payer: Medicare Other | Source: Ambulatory Visit | Attending: Physician Assistant | Admitting: Physician Assistant

## 2012-01-28 DIAGNOSIS — Z853 Personal history of malignant neoplasm of breast: Secondary | ICD-10-CM

## 2012-01-28 DIAGNOSIS — M810 Age-related osteoporosis without current pathological fracture: Secondary | ICD-10-CM

## 2012-01-28 DIAGNOSIS — R928 Other abnormal and inconclusive findings on diagnostic imaging of breast: Secondary | ICD-10-CM | POA: Diagnosis not present

## 2012-02-10 ENCOUNTER — Ambulatory Visit (INDEPENDENT_AMBULATORY_CARE_PROVIDER_SITE_OTHER): Payer: Medicare Other | Admitting: Internal Medicine

## 2012-02-10 ENCOUNTER — Encounter: Payer: Self-pay | Admitting: Internal Medicine

## 2012-02-10 ENCOUNTER — Ambulatory Visit: Payer: Medicare Other | Admitting: Internal Medicine

## 2012-02-10 VITALS — BP 124/80 | HR 72 | Temp 98.6°F | Resp 16 | Ht 65.0 in | Wt 162.0 lb

## 2012-02-10 DIAGNOSIS — E161 Other hypoglycemia: Secondary | ICD-10-CM | POA: Diagnosis not present

## 2012-02-10 DIAGNOSIS — E785 Hyperlipidemia, unspecified: Secondary | ICD-10-CM | POA: Diagnosis not present

## 2012-02-10 DIAGNOSIS — Z23 Encounter for immunization: Secondary | ICD-10-CM | POA: Diagnosis not present

## 2012-02-10 DIAGNOSIS — K219 Gastro-esophageal reflux disease without esophagitis: Secondary | ICD-10-CM | POA: Diagnosis not present

## 2012-02-10 NOTE — Patient Instructions (Addendum)
The patient is instructed to continue all medications as prescribed. Schedule followup with check out clerk upon leaving the clinic  

## 2012-02-10 NOTE — Progress Notes (Signed)
  Subjective:    Patient ID: [name], female    DOB: 10-07-1932, 78 y.o.   MRN: [id number]  HPI Pt has been on  Red rice yeast for lipids Discussed the gluten free diet    Review of Systems  Constitutional: Negative for activity change, appetite change and fatigue.  HENT: Negative for ear pain, congestion, neck pain, postnasal drip and sinus pressure.   Eyes: Negative for redness and visual disturbance.  Respiratory: Negative for cough, shortness of breath and wheezing.   Gastrointestinal: Negative for abdominal pain and abdominal distention.  Genitourinary: Negative for dysuria, frequency and menstrual problem.  Musculoskeletal: Negative for myalgias, joint swelling and arthralgias.  Skin: Negative for rash and wound.  Neurological: Negative for dizziness, weakness and headaches.  Hematological: Negative for adenopathy. Does not bruise/bleed easily.  Psychiatric/Behavioral: Negative for sleep disturbance and decreased concentration.       Objective:   Physical Exam  Nursing note and vitals reviewed. Constitutional: She appears well-developed and well-nourished.  HENT:  Head: Normocephalic and atraumatic.  Cardiovascular: Normal rate and regular rhythm.   Murmur heard. Pulmonary/Chest: Effort normal and breath sounds normal.  Abdominal: Soft. Bowel sounds are normal.          Assessment & Plan:  Gluten free diet for GERD, better lipid control and IBS

## 2012-02-11 ENCOUNTER — Ambulatory Visit: Payer: Medicare Other | Admitting: Internal Medicine

## 2012-02-24 ENCOUNTER — Other Ambulatory Visit: Payer: Self-pay | Admitting: Internal Medicine

## 2012-02-25 ENCOUNTER — Other Ambulatory Visit: Payer: Self-pay | Admitting: Gastroenterology

## 2012-02-25 MED ORDER — ALPRAZOLAM 0.5 MG PO TABS: 0.5000 mg | ORAL_TABLET | Freq: Every evening | ORAL | Status: DC | PRN

## 2012-02-25 MED ORDER — PANTOPRAZOLE SODIUM 40 MG PO TBEC: 40.0000 mg | DELAYED_RELEASE_TABLET | Freq: Every day | ORAL | Status: DC

## 2012-02-25 MED ORDER — LORAZEPAM 0.5 MG PO TABS: 0.5000 mg | ORAL_TABLET | ORAL | Status: DC | PRN

## 2012-03-01 DIAGNOSIS — B079 Viral wart, unspecified: Secondary | ICD-10-CM | POA: Diagnosis not present

## 2012-03-17 NOTE — Telephone Encounter (Signed)
Phone call complete

## 2012-03-19 DIAGNOSIS — E782 Mixed hyperlipidemia: Secondary | ICD-10-CM | POA: Diagnosis not present

## 2012-03-19 DIAGNOSIS — M810 Age-related osteoporosis without current pathological fracture: Secondary | ICD-10-CM | POA: Diagnosis not present

## 2012-03-19 DIAGNOSIS — C50419 Malignant neoplasm of upper-outer quadrant of unspecified female breast: Secondary | ICD-10-CM | POA: Diagnosis not present

## 2012-03-22 DIAGNOSIS — H60509 Unspecified acute noninfective otitis externa, unspecified ear: Secondary | ICD-10-CM | POA: Diagnosis not present

## 2012-04-05 DIAGNOSIS — R05 Cough: Secondary | ICD-10-CM | POA: Diagnosis not present

## 2012-04-05 DIAGNOSIS — J310 Chronic rhinitis: Secondary | ICD-10-CM | POA: Diagnosis not present

## 2012-04-05 DIAGNOSIS — R059 Cough, unspecified: Secondary | ICD-10-CM | POA: Diagnosis not present

## 2012-04-19 DIAGNOSIS — H10509 Unspecified blepharoconjunctivitis, unspecified eye: Secondary | ICD-10-CM | POA: Diagnosis not present

## 2012-05-24 DIAGNOSIS — H01009 Unspecified blepharitis unspecified eye, unspecified eyelid: Secondary | ICD-10-CM | POA: Diagnosis not present

## 2012-05-26 DIAGNOSIS — I781 Nevus, non-neoplastic: Secondary | ICD-10-CM | POA: Diagnosis not present

## 2012-05-26 DIAGNOSIS — L905 Scar conditions and fibrosis of skin: Secondary | ICD-10-CM | POA: Diagnosis not present

## 2012-05-26 DIAGNOSIS — D239 Other benign neoplasm of skin, unspecified: Secondary | ICD-10-CM | POA: Diagnosis not present

## 2012-05-26 DIAGNOSIS — L570 Actinic keratosis: Secondary | ICD-10-CM | POA: Diagnosis not present

## 2012-05-26 DIAGNOSIS — D1801 Hemangioma of skin and subcutaneous tissue: Secondary | ICD-10-CM | POA: Diagnosis not present

## 2012-05-26 DIAGNOSIS — L708 Other acne: Secondary | ICD-10-CM | POA: Diagnosis not present

## 2012-05-26 DIAGNOSIS — L821 Other seborrheic keratosis: Secondary | ICD-10-CM | POA: Diagnosis not present

## 2012-05-26 DIAGNOSIS — L819 Disorder of pigmentation, unspecified: Secondary | ICD-10-CM | POA: Diagnosis not present

## 2012-05-26 DIAGNOSIS — L723 Sebaceous cyst: Secondary | ICD-10-CM | POA: Diagnosis not present

## 2012-06-07 ENCOUNTER — Telehealth: Payer: Self-pay | Admitting: Internal Medicine

## 2012-06-07 MED ORDER — CHLORAL HYDRATE 500 MG/5ML PO SYRP: 1000.0000 mg | ORAL_SOLUTION | Freq: Every evening | ORAL | Status: DC | PRN

## 2012-06-07 NOTE — Telephone Encounter (Signed)
Pt needs refill of chloral hydrate 500 MG/5ML syrup Pharm: [facility].  Pt is in [place] for the winter.

## 2012-07-14 DIAGNOSIS — R05 Cough: Secondary | ICD-10-CM | POA: Diagnosis not present

## 2012-07-14 DIAGNOSIS — J320 Chronic maxillary sinusitis: Secondary | ICD-10-CM | POA: Diagnosis not present

## 2012-07-14 DIAGNOSIS — R059 Cough, unspecified: Secondary | ICD-10-CM | POA: Diagnosis not present

## 2012-07-17 ENCOUNTER — Telehealth: Payer: Self-pay | Admitting: Oncology

## 2012-07-17 NOTE — Telephone Encounter (Signed)
S/W PT IN RE TO PROVIDER CHANGE  TO DR. Welton Flakes.  APPT 06/27 @ 10:30 LABS, [address].  LETTER/CALENDAR MAILED

## 2012-07-19 ENCOUNTER — Encounter: Payer: Self-pay | Admitting: Oncology

## 2012-07-20 DIAGNOSIS — H01009 Unspecified blepharitis unspecified eye, unspecified eyelid: Secondary | ICD-10-CM | POA: Diagnosis not present

## 2012-07-20 DIAGNOSIS — H1010 Acute atopic conjunctivitis, unspecified eye: Secondary | ICD-10-CM | POA: Diagnosis not present

## 2012-08-06 DIAGNOSIS — M2040 Other hammer toe(s) (acquired), unspecified foot: Secondary | ICD-10-CM | POA: Diagnosis not present

## 2012-08-13 DIAGNOSIS — N952 Postmenopausal atrophic vaginitis: Secondary | ICD-10-CM | POA: Diagnosis not present

## 2012-08-13 DIAGNOSIS — J301 Allergic rhinitis due to pollen: Secondary | ICD-10-CM | POA: Diagnosis not present

## 2012-08-25 ENCOUNTER — Telehealth: Payer: Self-pay | Admitting: Internal Medicine

## 2012-08-25 MED ORDER — CHLORAL HYDRATE 500 MG/5ML PO SYRP: 1000.0000 mg | ORAL_SOLUTION | Freq: Every evening | ORAL | Status: DC | PRN

## 2012-08-25 NOTE — Telephone Encounter (Signed)
Called to [facility]

## 2012-08-25 NOTE — Telephone Encounter (Signed)
Patient called stating that she need a refill of her chloral hydrate 500 MG/5ML syrup  Take 10 mLs (1,000 mg total) by mouth at bedtime as needed [facility]. Please assist.

## 2012-08-30 ENCOUNTER — Telehealth: Payer: Self-pay | Admitting: Internal Medicine

## 2012-08-31 ENCOUNTER — Other Ambulatory Visit: Payer: Self-pay | Admitting: Gastroenterology

## 2012-08-31 DIAGNOSIS — M810 Age-related osteoporosis without current pathological fracture: Secondary | ICD-10-CM | POA: Diagnosis not present

## 2012-08-31 DIAGNOSIS — C50419 Malignant neoplasm of upper-outer quadrant of unspecified female breast: Secondary | ICD-10-CM | POA: Diagnosis not present

## 2012-08-31 MED ORDER — ALPRAZOLAM 0.5 MG PO TABS: 0.5000 mg | ORAL_TABLET | Freq: Every evening | ORAL | Status: DC | PRN

## 2012-08-31 NOTE — Telephone Encounter (Signed)
Patient is calling back for an update on her medication refill.  Said she did not hear from anyone yesterday.

## 2012-08-31 NOTE — Telephone Encounter (Signed)
Spoke to tp told her I am sending her her Refill of Xanex, pt was thrilled.

## 2012-09-09 DIAGNOSIS — D2360 Other benign neoplasm of skin of unspecified upper limb, including shoulder: Secondary | ICD-10-CM | POA: Diagnosis not present

## 2012-09-09 DIAGNOSIS — L821 Other seborrheic keratosis: Secondary | ICD-10-CM | POA: Diagnosis not present

## 2012-09-09 DIAGNOSIS — L819 Disorder of pigmentation, unspecified: Secondary | ICD-10-CM | POA: Diagnosis not present

## 2012-09-22 ENCOUNTER — Telehealth: Payer: Self-pay | Admitting: Internal Medicine

## 2012-09-22 MED ORDER — PANTOPRAZOLE SODIUM 40 MG PO TBEC: 40.0000 mg | DELAYED_RELEASE_TABLET | Freq: Every day | ORAL | Status: DC

## 2012-09-22 NOTE — Telephone Encounter (Signed)
Sent pantoprazole to Pt's pharmacy.

## 2012-09-30 ENCOUNTER — Encounter: Payer: Medicare Other | Admitting: Internal Medicine

## 2012-10-11 DIAGNOSIS — L608 Other nail disorders: Secondary | ICD-10-CM | POA: Diagnosis not present

## 2012-10-21 ENCOUNTER — Other Ambulatory Visit: Payer: Self-pay | Admitting: Medical Oncology

## 2012-10-21 DIAGNOSIS — Z853 Personal history of malignant neoplasm of breast: Secondary | ICD-10-CM

## 2012-10-22 ENCOUNTER — Other Ambulatory Visit: Payer: Medicare Other | Admitting: Lab

## 2012-10-22 ENCOUNTER — Ambulatory Visit: Payer: Medicare Other | Admitting: Oncology

## 2012-10-26 ENCOUNTER — Telehealth: Payer: Self-pay | Admitting: Oncology

## 2012-10-26 NOTE — Telephone Encounter (Signed)
, °

## 2012-11-02 ENCOUNTER — Telehealth: Payer: Self-pay | Admitting: *Deleted

## 2012-11-02 ENCOUNTER — Encounter: Payer: Self-pay | Admitting: Adult Health

## 2012-11-02 ENCOUNTER — Ambulatory Visit (HOSPITAL_BASED_OUTPATIENT_CLINIC_OR_DEPARTMENT_OTHER): Payer: Medicare Other | Admitting: Adult Health

## 2012-11-02 ENCOUNTER — Other Ambulatory Visit (HOSPITAL_BASED_OUTPATIENT_CLINIC_OR_DEPARTMENT_OTHER): Payer: Medicare Other | Admitting: Lab

## 2012-11-02 VITALS — BP 137/75 | HR 66 | Temp 97.7°F | Resp 20 | Ht 65.0 in | Wt 156.2 lb

## 2012-11-02 DIAGNOSIS — M899 Disorder of bone, unspecified: Secondary | ICD-10-CM | POA: Diagnosis not present

## 2012-11-02 DIAGNOSIS — M8580 Other specified disorders of bone density and structure, unspecified site: Secondary | ICD-10-CM

## 2012-11-02 DIAGNOSIS — Z853 Personal history of malignant neoplasm of breast: Secondary | ICD-10-CM

## 2012-11-02 DIAGNOSIS — M949 Disorder of cartilage, unspecified: Secondary | ICD-10-CM | POA: Diagnosis not present

## 2012-11-02 LAB — CBC WITH DIFFERENTIAL/PLATELET
BASO%: 0.8 % (ref 0.0–2.0)
Basophils Absolute: 0 10e3/uL (ref 0.0–0.1)
EOS%: 0.7 % (ref 0.0–7.0)
Eosinophils Absolute: 0 10e3/uL (ref 0.0–0.5)
HCT: 41.9 % (ref 34.8–46.6)
HGB: 14 g/dL (ref 11.6–15.9)
LYMPH%: 32.8 % (ref 14.0–49.7)
MCH: 31.6 pg (ref 25.1–34.0)
MCHC: 33.4 g/dL (ref 31.5–36.0)
MCV: 94.7 fL (ref 79.5–101.0)
MONO#: 0.4 10e3/uL (ref 0.1–0.9)
MONO%: 6.7 % (ref 0.0–14.0)
NEUT#: 3.5 10e3/uL (ref 1.5–6.5)
NEUT%: 59 % (ref 38.4–76.8)
Platelets: 169 10e3/uL (ref 145–400)
RBC: 4.43 10e6/uL (ref 3.70–5.45)
RDW: 13.4 % (ref 11.2–14.5)
WBC: 6 10e3/uL (ref 3.9–10.3)
lymph#: 2 10e3/uL (ref 0.9–3.3)

## 2012-11-02 LAB — COMPREHENSIVE METABOLIC PANEL (CC13)
ALT: 25 U/L (ref 0–55)
AST: 27 U/L (ref 5–34)
Calcium: 9.2 mg/dL (ref 8.4–10.4)
Chloride: 103 mEq/L (ref 98–109)
Creatinine: 0.8 mg/dL (ref 0.6–1.1)
Sodium: 138 mEq/L (ref 136–145)
Total Bilirubin: 0.73 mg/dL (ref 0.20–1.20)
Total Protein: 6.6 g/dL (ref 6.4–8.3)

## 2012-11-02 NOTE — Progress Notes (Signed)
OFFICE PROGRESS NOTE  CC**  Carrie Mew, MD [address] [place] [address]  DIAGNOSIS:  79 y/o with h/o stage IA invasive lobular carcinoma of the right breast ER positive, PR negative, HER-2/neu negative diagnosed in 2005.    PRIOR THERAPY:  1. Patient underwent screening mammogram that revealed a right breast mass, right diagnostic mammo and ultrasound confirmed its presence, and patient was biopsied on 02/16/2004 and revealed invasive mammary carcinoma.  MRI showed a 13 mm mass in the upper inner quadrant of the right breast.    2.  Lumpectomy was performed on 03/05/2004.  A 0.9cm tumor was removed.  Grade 1 invasive lobular carcinoma, ER positive, PR negative, HER-2/neu negative with a Ki-67 of 5%.  Sentineal nodes were negative.    3. Patient underwent radiation therapy in [place], [place] from 03/2004 through January 2006.    4. She was later enrolled on the MA-27 trial and placed on the Aromasin arm.  She completed one year of Aromasin, and it was discontinued due to severe joint aches and pains, followed by Toremifine for 2-3 years, and then Letrozole to complete out a total of 5 years of anti-estrogen therapy in 2011.   CURRENT THERAPY: Observation  INTERVAL HISTORY: [name] 79 y.o. female returns for f/u today for evaluation of her h/o right breast cancer.  She is doing well today.  She lives in [place] for part of the year and [place] for part of the year.  She exercises, has a healthy diet, and a 10 point ROS is negative.  Her health maintenance was updated below.    MEDICAL HISTORY: Past Medical History  Diagnosis Date  . Hypercholesteremia   . Osteopenia   . Ulcer 2012    bleeding gastric Ulcer  . Cancer     rt breast    ALLERGIES:  is allergic to amoxicillin-pot clavulanate; penicillins; and sulfonamide derivatives.  MEDICATIONS:  Current Outpatient Prescriptions  Medication Sig Dispense Refill  . ALPRAZolam (XANAX) 0.5 MG tablet  Take 1 tablet (0.5 mg total) by mouth at bedtime as needed for sleep.  30 tablet  5  . Calcium ([facility] CALCIUM-600/VIT D) 600-200 MG-UNIT per tablet Take 1 tablet by mouth daily.        . chloral hydrate 500 MG/5ML syrup Take 10 mLs (1,000 mg total) by mouth at bedtime as needed.  900 mL  1  . cholecalciferol (VITAMIN D) 1000 UNITS tablet Take 1,000 Units by mouth daily.      Marland Kitchen desonide (DESOWEN) 0.05 % cream       . LORazepam (ATIVAN) 0.5 MG tablet Take 1 tablet (0.5 mg total) by mouth as needed.  30 tablet  5  . Magnesium 250 MG TABS Take 500 mg by mouth daily.      . pantoprazole (PROTONIX) 40 MG tablet Take 1 tablet (40 mg total) by mouth daily.  30 tablet  7  . Red Yeast Rice Extract ([facility]S RED YEAST RICE PO) Take by mouth daily.       No current facility-administered medications for this visit.    SURGICAL HISTORY:  Past Surgical History  Procedure Laterality Date  . Breast surgery    . Tonsillectomy    . Parathyroidectomy    . Cosmetic surgery      face    REVIEW OF SYSTEMS:  General: fatigue (-), night sweats (-), fever (-), pain (-) Lymph: palpable nodes (-) HEENT: vision changes (-), mucositis (-), gum bleeding (-), epistaxis (-)  Cardiovascular: chest pain (-), palpitations (-) Pulmonary: shortness of breath (-), dyspnea on exertion (-), cough (-), hemoptysis (-) GI:  Early satiety (-), melena (-), dysphagia (-), nausea/vomiting (-), diarrhea (-) GU: dysuria (-), hematuria (-), incontinence (-) Musculoskeletal: joint swelling (-), joint pain (-), back pain (-) Neuro: weakness (-), numbness (-), headache (-), confusion (-) Skin: Rash (-), lesions (-), dryness (-) Psych: depression (-), suicidal/homicidal ideation (-), feeling of hopelessness (-)   HEALTH MAINTENANCE:  Mammogram 01/2012 Colonoscopy 5 years ago, no f/u recommended Bone Density 01/2011 Eye Exam several per year Lipid Panel scheduled in August 2014  PHYSICAL EXAMINATION: Blood pressure 137/75, pulse 66,  temperature 97.7 F (36.5 C), temperature source Oral, resp. rate 20, height 5\' 5"  (1.651 m), weight 156 lb 3.2 oz (70.852 kg). Body mass index is 25.99 kg/(m^2). General: Patient is a well appearing female in no acute distress HEENT: PERRLA, sclerae anicteric no conjunctival pallor, MMM Neck: supple, no palpable adenopathy Lungs: clear to auscultation bilaterally, no wheezes, rhonchi, or rales Cardiovascular: regular rate rhythm, S1, S2, no murmurs, rubs or gallops Abdomen: Soft, non-tender, non-distended, normoactive bowel sounds, no HSM Extremities: warm and well perfused, no clubbing, cyanosis, or edema Skin: No rashes or lesions Neuro: Non-focal Breasts: Right breast lumpectomy site well healed, no nodularity, mass, recurrence, left breast no nodules, masses, skin/nipple changes ECOG PERFORMANCE STATUS: 0 - Asymptomatic    LABORATORY DATA: Lab Results  Component Value Date   WBC 6.0 11/02/2012   HGB 14.0 11/02/2012   HCT 41.9 11/02/2012   MCV 94.7 11/02/2012   PLT 169 11/02/2012      Chemistry      Component Value Date/Time   NA 138 11/02/2012 1431   NA 140 10/07/2011 1035   K 3.9 11/02/2012 1431   K 4.1 10/07/2011 1035   CL 105 10/07/2011 1035   CO2 28 11/02/2012 1431   CO2 24 10/07/2011 1035   BUN 18.1 11/02/2012 1431   BUN 18 10/07/2011 1035   CREATININE 0.8 11/02/2012 1431   CREATININE 0.78 10/07/2011 1035      Component Value Date/Time   CALCIUM 9.2 11/02/2012 1431   CALCIUM 9.1 10/07/2011 1035   ALKPHOS 80 11/02/2012 1431   ALKPHOS 76 10/07/2011 1035   AST 27 11/02/2012 1431   AST 21 10/07/2011 1035   ALT 25 11/02/2012 1431   ALT 14 10/07/2011 1035   BILITOT 0.73 11/02/2012 1431   BILITOT 0.5 10/07/2011 1035       RADIOGRAPHIC STUDIES:  No results found.  ASSESSMENT: Patient is a 79 year old female with history of stage IA invasive lobular carcinoma of the right breast.  ER positive, PR negative, HER-2/neu negative, with a Ki-67 of 5%.  She has underwent adjuvant radiation, and has  completed 5 years of anti-estrogen therapy.  She is current on her mammograms.     PLAN:   1. Doing well.  No sign of recurrence.  Continue with healthy diet and exercise, along with Vitamin D supplementation.  I ordered a mammo and bone density today for October, 2014.      2. She will follow up in one year.     All questions were answered. The patient knows to call the clinic with any problems, questions or concerns. We can certainly see the patient much sooner if necessary.  I spent 40 minutes counseling the patient face to face. The total time spent in the appointment was 60 minutes.  Cherie Ouch Lyn Hollingshead, NP Medical Oncology [facility] Phone: [phone] 11/03/2012, 11:20 AM

## 2012-11-02 NOTE — Patient Instructions (Addendum)
Doing well.  No sign of recurrence. Continue with healthy diet and exercise. We will see you back in 1 year.  Please call us if you have any questions or concerns.

## 2012-11-02 NOTE — Telephone Encounter (Signed)
appts made and printed...td 

## 2012-11-04 NOTE — Progress Notes (Signed)
ATTENDING'S ATTESTATION:  I personally reviewed patient's chart, examined patient myself, formulated the treatment plan as followed.    Very pleasant 79 year old female with prior history of stage I a invasive lobular carcinoma of the right breast ER positive PR negative HER-2/neu negative with a low Ki-67. She received radiation and then completed 5 years of antiestrogen therapy. Overall she's doing well no evidence of recurrent disease. Patient does travel between [place] and [place]. She is due to go back to [place] in October.  Patient and I discussed role of tumor markers. I explained to her that guidelines do not recommend routine scanning or routine tumor markers. Therefore we are not dealing him at this point. She considered this for a quite a long time and is satisfied with not following the tumor markers or doing scans.  We discussed survivorship and how to recognize the possibility of a recurrence. I have encouraged her to do self breast examination and continuing to exercise and healthy diet and to continue mammograms. She does also have a bone density scan pending in October 2014.  We will continue to see her on a yearly basis.   Drue Second, MD Medical/Oncology [facility] [phone] (beeper) [phone] (Office)

## 2012-11-05 DIAGNOSIS — L608 Other nail disorders: Secondary | ICD-10-CM | POA: Diagnosis not present

## 2012-11-09 DIAGNOSIS — L723 Sebaceous cyst: Secondary | ICD-10-CM | POA: Diagnosis not present

## 2012-11-09 DIAGNOSIS — D239 Other benign neoplasm of skin, unspecified: Secondary | ICD-10-CM | POA: Diagnosis not present

## 2012-11-09 DIAGNOSIS — L608 Other nail disorders: Secondary | ICD-10-CM | POA: Diagnosis not present

## 2012-11-09 DIAGNOSIS — L819 Disorder of pigmentation, unspecified: Secondary | ICD-10-CM | POA: Diagnosis not present

## 2012-11-09 DIAGNOSIS — D1801 Hemangioma of skin and subcutaneous tissue: Secondary | ICD-10-CM | POA: Diagnosis not present

## 2012-11-09 DIAGNOSIS — T148 Other injury of unspecified body region: Secondary | ICD-10-CM | POA: Diagnosis not present

## 2012-11-09 DIAGNOSIS — L821 Other seborrheic keratosis: Secondary | ICD-10-CM | POA: Diagnosis not present

## 2012-11-09 DIAGNOSIS — W57XXXA Bitten or stung by nonvenomous insect and other nonvenomous arthropods, initial encounter: Secondary | ICD-10-CM | POA: Diagnosis not present

## 2012-11-24 ENCOUNTER — Telehealth: Payer: Self-pay | Admitting: Internal Medicine

## 2012-11-24 MED ORDER — CHLORAL HYDRATE 500 MG/5ML PO SYRP: 1000.0000 mg | ORAL_SOLUTION | Freq: Every evening | ORAL | Status: DC | PRN

## 2012-11-24 NOTE — Telephone Encounter (Signed)
Caller: Kloie/Patient; Phone: [phone]; Reason for Call: Patient has remaining refills for Chloral Hydrate 100mg /mL 900 mL q 3 months with [facility].  However, patient is not "due" to refill her medication until 8/12.  However, patient needs this refill by 8/7 prior to leaving town for 1 week vacation.  Pharmacy will not refill prior to 8/12 without approval from the office.  Patient is requesting that "Kendal Hymen" call [facility] and give approval and then call her when this has been completed.  PLEASE FOLLOW UP WITH PATIENT REGARDING HER REQUEST.  THANK YOU.

## 2012-11-24 NOTE — Telephone Encounter (Signed)
This was done and pt informed.

## 2012-11-24 NOTE — Telephone Encounter (Signed)
Called [facility] and p t informed

## 2012-11-25 DIAGNOSIS — Z1289 Encounter for screening for malignant neoplasm of other sites: Secondary | ICD-10-CM | POA: Diagnosis not present

## 2012-11-29 ENCOUNTER — Encounter: Payer: Self-pay | Admitting: Internal Medicine

## 2012-11-29 ENCOUNTER — Ambulatory Visit (INDEPENDENT_AMBULATORY_CARE_PROVIDER_SITE_OTHER): Payer: Medicare Other | Admitting: Internal Medicine

## 2012-11-29 ENCOUNTER — Telehealth: Payer: Self-pay | Admitting: Internal Medicine

## 2012-11-29 ENCOUNTER — Encounter: Payer: Medicare Other | Admitting: Internal Medicine

## 2012-11-29 VITALS — BP 130/80 | HR 72 | Temp 98.6°F | Resp 16 | Ht 65.0 in | Wt 156.0 lb

## 2012-11-29 DIAGNOSIS — I350 Nonrheumatic aortic (valve) stenosis: Secondary | ICD-10-CM

## 2012-11-29 DIAGNOSIS — E785 Hyperlipidemia, unspecified: Secondary | ICD-10-CM

## 2012-11-29 DIAGNOSIS — I050 Rheumatic mitral stenosis: Secondary | ICD-10-CM

## 2012-11-29 DIAGNOSIS — Z0000 Encounter for general adult medical examination without abnormal findings: Secondary | ICD-10-CM

## 2012-11-29 DIAGNOSIS — M949 Disorder of cartilage, unspecified: Secondary | ICD-10-CM

## 2012-11-29 DIAGNOSIS — I10 Essential (primary) hypertension: Secondary | ICD-10-CM | POA: Diagnosis not present

## 2012-11-29 DIAGNOSIS — I340 Nonrheumatic mitral (valve) insufficiency: Secondary | ICD-10-CM

## 2012-11-29 DIAGNOSIS — I359 Nonrheumatic aortic valve disorder, unspecified: Secondary | ICD-10-CM

## 2012-11-29 DIAGNOSIS — M8580 Other specified disorders of bone density and structure, unspecified site: Secondary | ICD-10-CM

## 2012-11-29 DIAGNOSIS — M899 Disorder of bone, unspecified: Secondary | ICD-10-CM

## 2012-11-29 LAB — POCT URINALYSIS DIPSTICK
Ketones, UA: NEGATIVE
Leukocytes, UA: NEGATIVE
Protein, UA: NEGATIVE
Spec Grav, UA: 1.01
Urobilinogen, UA: 0.2
pH, UA: 7

## 2012-11-29 LAB — LIPID PANEL
HDL: 86.2 mg/dL (ref >39.00)
Total CHOL/HDL Ratio: 3
VLDL: 19.6 mg/dL (ref 0.0–40.0)

## 2012-11-29 MED ORDER — RANITIDINE HCL 300 MG PO TABS: 300.0000 mg | ORAL_TABLET | Freq: Two times a day (BID) | ORAL | Status: DC

## 2012-11-29 NOTE — Progress Notes (Signed)
Subjective:    Patient ID: [name], female    DOB: Jan 21, 1933, 79 y.o.   MRN: [id number]  HPI CPX Fall and functional assessment "new or worsening murmur" Takes 40 mg omperazole daily Has some "neuropathy" Her gynecologist is identified on increasing sound of a heart murmur she has a history of mitral valve prolapse with prior to her 6 systolic murmur documented in the chart She is followed for hyperlipidemia mild osteoporosis mild anxiety a history of breast cancer and seasonal allergic rhinitis   Review of Systems  Constitutional: Negative for activity change, appetite change and fatigue.  HENT: Negative for ear pain, congestion, neck pain, postnasal drip and sinus pressure.   Eyes: Negative for redness and visual disturbance.  Respiratory: Negative for cough, shortness of breath and wheezing.   Gastrointestinal: Negative for abdominal pain and abdominal distention.  Genitourinary: Negative for dysuria, frequency and menstrual problem.  Musculoskeletal: Negative for myalgias, joint swelling and arthralgias.  Skin: Negative for rash and wound.  Neurological: Negative for dizziness, weakness and headaches.  Hematological: Negative for adenopathy. Does not bruise/bleed easily.  Psychiatric/Behavioral: Negative for sleep disturbance and decreased concentration.   Past Medical History  Diagnosis Date  . Hypercholesteremia   . Osteopenia   . Ulcer 2012    bleeding gastric Ulcer  . Cancer     rt breast    History   Social History  . Marital Status: Married    Spouse Name: N/A    Number of Children: N/A  . Years of Education: N/A   Occupational History  . Not on file.   Social History Main Topics  . Smoking status: Former Games developer  . Smokeless tobacco: Never Used  . Alcohol Use: 4.2 oz/week    7 Glasses of wine per week  . Drug Use: No  . Sexually Active: Not on file   Other Topics Concern  . Not on file   Social History Narrative  . No narrative on file     Past Surgical History  Procedure Laterality Date  . Breast surgery    . Tonsillectomy    . Parathyroidectomy    . Cosmetic surgery      face    Family History  Problem Relation Age of Onset  . Hypertension Mother   . Rectal cancer Mother   . Kidney disease Father     Allergies  Allergen Reactions  . Amoxicillin-Pot Clavulanate     REACTION: hives  . Penicillins Hives  . Sulfonamide Derivatives     REACTION: hives    Current Outpatient Prescriptions on File Prior to Visit  Medication Sig Dispense Refill  . ALPRAZolam (XANAX) 0.5 MG tablet Take 1 tablet (0.5 mg total) by mouth at bedtime as needed for sleep.  30 tablet  5  . Calcium ([facility]S CALCIUM-600/VIT D) 600-200 MG-UNIT per tablet Take 1 tablet by mouth daily.        . chloral hydrate 500 MG/5ML syrup Take 10 mLs (1,000 mg total) by mouth at bedtime as needed.  900 mL  1  . cholecalciferol (VITAMIN D) 1000 UNITS tablet Take 1,000 Units by mouth daily.      Marland Kitchen desonide (DESOWEN) 0.05 % cream       . LORazepam (ATIVAN) 0.5 MG tablet Take 1 tablet (0.5 mg total) by mouth as needed.  30 tablet  5  . Magnesium 250 MG TABS Take 500 mg by mouth daily.      . Red Yeast Rice Extract ([facility]S RED YEAST  RICE PO) Take by mouth daily.       No current facility-administered medications on file prior to visit.    BP 130/80  Pulse 72  Temp(Src) 98.6 F (37 C)  Resp 16  Ht 5\' 5"  (1.651 m)  Wt 156 lb (70.761 kg)  BMI 25.96 kg/m2     Objective:   Physical Exam  Nursing note and vitals reviewed. Constitutional: She is oriented to person, place, and time. She appears well-developed and well-nourished.  HENT:  Head: Normocephalic and atraumatic.  Neck: Normal range of motion. Neck supple.  Cardiovascular: Normal rate and regular rhythm.   Murmur heard. Pulmonary/Chest: Effort normal and breath sounds normal.  Abdominal: Soft. Bowel sounds are normal.  Musculoskeletal: Normal range of motion.  Neurological: She is alert and  oriented to person, place, and time.  Skin: Skin is warm and dry.  Psychiatric: She has a normal mood and affect. Her behavior is normal.          Assessment & Plan:  Change to zantac 300 BID History of GERD and possible side effects of a proton pump inhibitor she is mild to moderate neuropathy that may be due to B12 deficiency.  History of mitral valve prolapse and no mitral murmur there is a slight increase in the velocity of the mitral murmur that was detected by her gynecologist and by me on examination today I would rate as a 3/6 systolic murmur pansystolic regurgitation she should be evaluated for valvular dysfunction in light of her history of mitral valve prolapse she is asymptomatic at this point able to play tennis as she normally has she does get mildly short of breath climbing 2 flights of steps  cchronic insomnia on chloral hydrate Vitamin D replacement monitor vitamin D levels today.  Mild to moderate anxiety on alprazolam.  Her history of borderline on lipidemia taking fish oil we'll measure her lipid and liver today.  I spent 45 minutes with this patient

## 2012-11-29 NOTE — Telephone Encounter (Signed)
Left message on machine Do not stop meds

## 2012-11-29 NOTE — Patient Instructions (Addendum)
The patient is instructed to continue all medications as prescribed. Schedule followup with check out clerk upon leaving the clinic  

## 2012-11-30 DIAGNOSIS — Z1211 Encounter for screening for malignant neoplasm of colon: Secondary | ICD-10-CM | POA: Diagnosis not present

## 2012-11-30 LAB — VITAMIN D 25 HYDROXY (VIT D DEFICIENCY, FRACTURES): Vit D, 25-Hydroxy: 60 ng/mL (ref 30–89)

## 2012-12-01 ENCOUNTER — Ambulatory Visit (HOSPITAL_COMMUNITY): Payer: Medicare Other | Attending: Internal Medicine | Admitting: Radiology

## 2012-12-01 DIAGNOSIS — I340 Nonrheumatic mitral (valve) insufficiency: Secondary | ICD-10-CM

## 2012-12-01 DIAGNOSIS — R011 Cardiac murmur, unspecified: Secondary | ICD-10-CM | POA: Diagnosis not present

## 2012-12-01 DIAGNOSIS — R0602 Shortness of breath: Secondary | ICD-10-CM | POA: Diagnosis not present

## 2012-12-01 DIAGNOSIS — I059 Rheumatic mitral valve disease, unspecified: Secondary | ICD-10-CM

## 2012-12-01 DIAGNOSIS — I080 Rheumatic disorders of both mitral and aortic valves: Secondary | ICD-10-CM | POA: Insufficient documentation

## 2012-12-01 DIAGNOSIS — I079 Rheumatic tricuspid valve disease, unspecified: Secondary | ICD-10-CM | POA: Insufficient documentation

## 2012-12-01 NOTE — Progress Notes (Signed)
Echocardiogram performed.  

## 2012-12-02 ENCOUNTER — Encounter: Payer: Self-pay | Admitting: Internal Medicine

## 2012-12-02 ENCOUNTER — Other Ambulatory Visit: Payer: Self-pay | Admitting: Internal Medicine

## 2012-12-02 ENCOUNTER — Telehealth: Payer: Self-pay | Admitting: Internal Medicine

## 2012-12-02 DIAGNOSIS — I350 Nonrheumatic aortic (valve) stenosis: Secondary | ICD-10-CM | POA: Insufficient documentation

## 2012-12-02 NOTE — Telephone Encounter (Signed)
Sent to dr Lovell Sheehan and he is going to call patient

## 2012-12-02 NOTE — Telephone Encounter (Signed)
[name]/Patient [phone] called for results of echocardiogram done 12/01/12.  She said she "knows" the results are back and demands to know the results today 12/02/12 before she leaves on 1 week vacation 12/03/12.  Informed Dr Lovell Sheehan is out of the office today so her request will be forwarded to the on call provider.  Please call back on cell [phone] which she will have with her all day.

## 2012-12-23 DIAGNOSIS — H2510 Age-related nuclear cataract, unspecified eye: Secondary | ICD-10-CM | POA: Diagnosis not present

## 2012-12-23 DIAGNOSIS — H02839 Dermatochalasis of unspecified eye, unspecified eyelid: Secondary | ICD-10-CM | POA: Diagnosis not present

## 2012-12-23 DIAGNOSIS — H35379 Puckering of macula, unspecified eye: Secondary | ICD-10-CM | POA: Diagnosis not present

## 2012-12-23 DIAGNOSIS — H35039 Hypertensive retinopathy, unspecified eye: Secondary | ICD-10-CM | POA: Diagnosis not present

## 2012-12-23 DIAGNOSIS — H40019 Open angle with borderline findings, low risk, unspecified eye: Secondary | ICD-10-CM | POA: Diagnosis not present

## 2012-12-23 DIAGNOSIS — H353190 Nonexudative age-related macular degeneration, unspecified eye, stage unspecified: Secondary | ICD-10-CM | POA: Diagnosis not present

## 2012-12-23 DIAGNOSIS — L719 Rosacea, unspecified: Secondary | ICD-10-CM | POA: Diagnosis not present

## 2012-12-29 ENCOUNTER — Encounter: Payer: Self-pay | Admitting: Cardiovascular Disease

## 2012-12-29 ENCOUNTER — Ambulatory Visit (INDEPENDENT_AMBULATORY_CARE_PROVIDER_SITE_OTHER): Payer: Medicare Other | Admitting: Cardiovascular Disease

## 2012-12-29 VITALS — BP 112/78 | HR 58 | Ht 65.5 in | Wt 156.0 lb

## 2012-12-29 DIAGNOSIS — R0602 Shortness of breath: Secondary | ICD-10-CM

## 2012-12-29 DIAGNOSIS — I359 Nonrheumatic aortic valve disorder, unspecified: Secondary | ICD-10-CM

## 2012-12-29 DIAGNOSIS — I350 Nonrheumatic aortic (valve) stenosis: Secondary | ICD-10-CM

## 2012-12-29 NOTE — Patient Instructions (Addendum)
Your physician has requested that you have an exercise tolerance test with Dr Excell Seltzer. For further information please visit [email or web address] Please also follow instruction sheet, as given.  Your physician wants you to follow-up in: MAY 2015 with Dr Excell Seltzer.  You will receive a reminder letter in the mail two months in advance. If you don't receive a letter, please call our office to schedule the follow-up appointment.  Your physician recommends that you have lab work today: BNP

## 2012-12-31 ENCOUNTER — Encounter: Payer: Self-pay | Admitting: Cardiovascular Disease

## 2012-12-31 NOTE — Progress Notes (Signed)
HPI:   79 year-old woman presenting for initial cardiac evaluation. The patient has been diagnosed with severe aortic stenosis. She has a longstanding heart murmur and has been told she has mitral valve prolapse in the past. She recently was noted to have a louder murmur and went for an echo demonstrating severe bicuspid aortic valve stenosis. Also noted was bileaflet MVP with mild MR.  She feels well and has no complaints. She is physically active and plays doubles tennis on a regular basis. She specifically denies exertional chest pain, dyspnea, or lightheadedness. She denies edema, palpitations, orthopnea, or PND. She has minimal dyspnea when climbing stairs but this is longstanding and hasn't changed over the years.   She is a retired Veterinary surgeon. She spends several months/year in [place]. Her medial history is pertinent for a hx of breast cancer treated with lumpectomy and XRT in 2005. Otherwise she's had minimal medical problems.   Outpatient Encounter Prescriptions as of 12/29/2012  Medication Sig Dispense Refill  . ALPRAZolam (XANAX) 0.5 MG tablet Take 1 tablet (0.5 mg total) by mouth at bedtime as needed for sleep.  30 tablet  5  . CALCIUM CITRATE PO Take 500 mg by mouth daily.      . Cholecalciferol (VITAMIN D-3) 1000 UNITS CAPS Take by mouth 1 day or 1 dose.      Marland Kitchen DOXYCYCLINE HYCLATE PO Take 50 mg by mouth daily.      . Magnesium 400 MG CAPS Take by mouth daily.      . Multiple Vitamins-Minerals (CENTRUM SILVER PO) Take by mouth.      . ranitidine (ZANTAC) 300 MG capsule Take 300 mg by mouth every evening.      . Red Yeast Rice Extract (CVS RED YEAST RICE PO) Take 600 mg by mouth daily.       . [DISCONTINUED] Calcium (CVS CALCIUM-600/VIT D) 600-200 MG-UNIT per tablet Take 1 tablet by mouth daily.        . [DISCONTINUED] chloral hydrate 500 MG/5ML syrup Take 10 mLs (1,000 mg total) by mouth at bedtime as needed.  900 mL  1  . [DISCONTINUED] cholecalciferol (VITAMIN D) 1000 UNITS tablet  Take 1,000 Units by mouth daily.      . [DISCONTINUED] desonide (DESOWEN) 0.05 % cream       . [DISCONTINUED] LORazepam (ATIVAN) 0.5 MG tablet Take 1 tablet (0.5 mg total) by mouth as needed.  30 tablet  5  . [DISCONTINUED] Magnesium 250 MG TABS Take 500 mg by mouth daily.      . [DISCONTINUED] ranitidine (ZANTAC) 300 MG tablet Take 1 tablet (300 mg total) by mouth 2 (two) times daily.  60 tablet  5   No facility-administered encounter medications on file as of 12/29/2012.    Amoxicillin-pot clavulanate; Penicillins; and Sulfonamide derivatives  Past Medical History  Diagnosis Date  . Hypercholesteremia   . Osteopenia   . Ulcer 2012    bleeding gastric Ulcer  . Cancer     rt breast  . Aortic valve stenosis, severe 12/02/2012  . Aortic valve stenosis, severe     Past Surgical History  Procedure Laterality Date  . Breast surgery    . Tonsillectomy    . Parathyroidectomy    . Cosmetic surgery      face    History   Social History  . Marital Status: Married    Spouse Name: N/A    Number of Children: N/A  . Years of Education: N/A   Occupational History  .  Not on file.   Social History Main Topics  . Smoking status: Former Games developer  . Smokeless tobacco: Never Used  . Alcohol Use: 4.2 oz/week    7 Glasses of wine per week  . Drug Use: No  . Sexual Activity: Not on file   Other Topics Concern  . Not on file   Social History Narrative  . No narrative on file    Family History  Problem Relation Age of Onset  . Hypertension Mother   . Rectal cancer Mother   . Kidney disease Father     ROS:  General: no fevers/chills/night sweats Eyes: no blurry vision, diplopia, or amaurosis ENT: no sore throat or hearing loss Resp: no cough, wheezing, or hemoptysis CV: no edema or palpitations GI: no abdominal pain, nausea, vomiting, diarrhea. Positive for constipation. Positive for hx of stomach ulcer.  GU: no dysuria, frequency, or hematuria Skin: no rash Neuro: no  headache, numbness, tingling, or weakness of extremities Musculoskeletal: no joint pain or swelling Heme: no bleeding, DVT, or easy bruising Endo: no polydipsia or polyuria  BP 112/78  Pulse 58  Ht 5' 5.5" (1.664 m)  Wt 156 lb (70.761 kg)  BMI 25.56 kg/m2  SpO2 99%  PHYSICAL EXAM: Pt is alert and oriented, WD, WN, in no distress. HEENT: normal Neck: JVP normal. Carotid upstrokes normal without bruits. No thyromegaly. Lungs: equal expansion, clear bilaterally CV: Apex is discrete and nondisplaced, RRR with grade 3/6 harsh systolic murmur at the RUSB. A2 is audible.  Abd: soft, NT, +BS, no bruit, no hepatosplenomegaly Back: no CVA tenderness Ext: no C/C/E        DP/PT pulses intact and = Skin: warm and dry without rash Neuro: CNII-XII intact             Strength intact = bilaterally  EKG:  Sinus bradycardia 58 bpm, within normal limits  2D Echo 12/01/2012: Left ventricle: The cavity size was normal. Wall thickness was increased in a pattern of mild LVH. Systolic function was normal. The estimated ejection fraction was in the range of 60% to 65%. Wall motion was normal; there were no regional wall motion abnormalities. Doppler parameters are consistent with abnormal left ventricular relaxation (grade 1 diastolic dysfunction).  ------------------------------------------------------------ Aortic valve: Probably bicuspid; severely calcified leaflets. Doppler: There was severe stenosis. Trivial regurgitation. VTI ratio of LVOT to aortic valve: 0.24. Valve area: 0.68cm^2(VTI). Indexed valve area: 0.38cm^2/m^2 (VTI). Peak velocity ratio of LVOT to aortic valve: 0.23. Valve area: 0.67cm^2 (Vmax). Indexed valve area: 0.37cm^2/m^2 (Vmax). Mean gradient: 47mm Hg (S). Peak gradient: 73mm Hg (S).  ------------------------------------------------------------ Aorta: Aortic root: The aortic root was normal in size. Ascending aorta: The ascending aorta was normal in  size.  ------------------------------------------------------------ Mitral valve: Bileaflet mitral valve prolapse. Doppler: There was no evidence for stenosis. Mild regurgitation. Peak gradient: 3mm Hg (D).  ------------------------------------------------------------ Left atrium: The atrium was mildly dilated.  ------------------------------------------------------------ Right ventricle: The cavity size was normal. Systolic function was normal.  ------------------------------------------------------------ Pulmonic valve: Structurally normal valve. Cusp separation was normal. Doppler: Transvalvular velocity was within the normal range. Trivial regurgitation.  ------------------------------------------------------------ Tricuspid valve: Doppler: Mild regurgitation.  ------------------------------------------------------------ Right atrium: The atrium was normal in size.  ------------------------------------------------------------ Pericardium: There was no pericardial effusion.  ------------------------------------------------------------ Systemic veins: Inferior vena cava: The vessel was normal in size; the respirophasic diameter changes were in the normal range (= 50%); findings are consistent with normal central venous pressure.  ASSESSMENT AND PLAN: Severe aortic stenosis, likely secondary to bicuspid aortic valve  disease. I had a lengthy discussion with the patient and her husband, as well as her daughter who teleconferenced with [place] by phone. I reviewed the mechanism and natural history of aortic stenosis. I personally reviewed her echo images and agree that she has a bicuspid valve. They understand that a period of observation is reasonable in patients who are completely asymptomatic with normal LV function. I reviewed symptoms to watch for (angina, dyspnea, and lightheadedness). She understands this. She understands the poor prognosis of symptomatic aortic stenosis with  medical therapy. I will plan on seeing her back in 6 months for follow-up (or when she returns from [place]). A BNP was done to look for subclinical LV strain and this was normal. I would like her to do an exercise treadmill to verify that there are no high-risk features present (BP drop with exericise, ST-T changes, or exertional angina). She will limit exercise to moderate level activities.   I spent over 60 minutes conducting her evaluation today, greater than 50% of that was spent in face-to-face discussion.  Tonny Bollman 12/31/2012 5:48 PM

## 2013-01-10 ENCOUNTER — Telehealth: Payer: Self-pay | Admitting: Internal Medicine

## 2013-01-10 ENCOUNTER — Encounter: Payer: Self-pay | Admitting: Internal Medicine

## 2013-01-10 NOTE — Telephone Encounter (Signed)
,  lomom to return call

## 2013-01-10 NOTE — Telephone Encounter (Signed)
PT states that her cholesterol is elevated, and she would like to speak with someone in regards to this. Please assist.

## 2013-01-11 ENCOUNTER — Telehealth: Payer: Self-pay | Admitting: Cardiovascular Disease

## 2013-01-11 NOTE — Telephone Encounter (Signed)
New Problem  Pt has a treadmill test and a flu shot sheduled on the same day// wants to know if having a flu shot will interfere with this test. Please leave the message on the answering machine.

## 2013-01-11 NOTE — Telephone Encounter (Signed)
Talked with pt 

## 2013-01-11 NOTE — Telephone Encounter (Signed)
I left message on the pt's voicemail that flu shot and treadmill will not interfere.

## 2013-01-12 DIAGNOSIS — L608 Other nail disorders: Secondary | ICD-10-CM | POA: Diagnosis not present

## 2013-01-12 DIAGNOSIS — L738 Other specified follicular disorders: Secondary | ICD-10-CM | POA: Diagnosis not present

## 2013-01-12 DIAGNOSIS — L819 Disorder of pigmentation, unspecified: Secondary | ICD-10-CM | POA: Diagnosis not present

## 2013-01-12 DIAGNOSIS — L570 Actinic keratosis: Secondary | ICD-10-CM | POA: Diagnosis not present

## 2013-01-13 ENCOUNTER — Ambulatory Visit: Payer: Medicare Other

## 2013-01-13 ENCOUNTER — Ambulatory Visit (INDEPENDENT_AMBULATORY_CARE_PROVIDER_SITE_OTHER): Payer: Medicare Other | Admitting: Cardiovascular Disease

## 2013-01-13 DIAGNOSIS — R0602 Shortness of breath: Secondary | ICD-10-CM

## 2013-01-13 DIAGNOSIS — I350 Nonrheumatic aortic (valve) stenosis: Secondary | ICD-10-CM

## 2013-01-13 DIAGNOSIS — I359 Nonrheumatic aortic valve disorder, unspecified: Secondary | ICD-10-CM | POA: Diagnosis not present

## 2013-01-13 NOTE — Patient Instructions (Signed)
Your physician has requested that you have an echocardiogram in MAY 2015. Echocardiography is a painless test that uses sound waves to create images of your heart. It provides your doctor with information about the size and shape of your heart and how well your heart's chambers and valves are working. This procedure takes approximately one hour. There are no restrictions for this procedure.  Your physician wants you to follow-up in: MAY 2015 with Dr Excell Seltzer.  You will receive a reminder letter in the mail two months in advance. If you don't receive a letter, please call our office to schedule the follow-up appointment.  Your physician recommends that you continue on your current medications as directed. Please refer to the Current Medication list given to you today.

## 2013-01-13 NOTE — Progress Notes (Signed)
Exercise Treadmill Test  Pre-Exercise Testing Evaluation Rhythm:Sinus Bradycardia  Rate: 55     Test  Exercise Tolerance Test Ordering MD: Tonny Bollman, MD  Interpreting MD: Tonny Bollman, MD  Unique Test No: 1  Treadmill:  1  Indication for ETT: Shortness of breath/ Aortic Stenosis  Contraindication to ETT: No   Stress Modality: exercise - treadmill  Cardiac Imaging Performed: non   Protocol: standard Bruce - maximal  Max BP:  214/97  Max MPHR (bpm):  143 85% MPR (bpm):  122  MPHR obtained (bpm):  123 % MPHR obtained:  86  Reached 85% MPHR (min:sec):  6:18 Total Exercise Time (min-sec):  6:23  Workload in METS:  7.5 Borg Scale: 13  Reason ETT Terminated:  patient's desire to stop    ST Segment Analysis At Rest: normal ST segments - no evidence of significant ST depression With Exercise: no evidence of significant ST depression  Other Information Arrhythmia:  Yes Angina during ETT:  absent (0) Quality of ETT:  diagnostic  ETT Interpretation:  normal - no evidence of ischemia by ST analysis  Comments: Fair exercise tolerance. Hypertensive response to exercise. No significant ST changes, angina, or hypotension with exertion. There were single PVC's and a ventricular couplet with exertion.  Recommendations: Continued observation. There are no 'red flags' with exercise with respect to her known aortic stenosis.

## 2013-01-17 ENCOUNTER — Encounter: Payer: Self-pay | Admitting: Internal Medicine

## 2013-01-18 ENCOUNTER — Ambulatory Visit: Payer: Medicare Other

## 2013-01-18 ENCOUNTER — Ambulatory Visit (INDEPENDENT_AMBULATORY_CARE_PROVIDER_SITE_OTHER): Payer: Medicare Other

## 2013-01-18 DIAGNOSIS — Z23 Encounter for immunization: Secondary | ICD-10-CM | POA: Diagnosis not present

## 2013-01-31 ENCOUNTER — Ambulatory Visit
Admission: RE | Admit: 2013-01-31 | Discharge: 2013-01-31 | Disposition: A | Discharge status: Home / Self Care | Payer: Medicare Other | Source: Ambulatory Visit | Attending: Adult Health | Admitting: Adult Health

## 2013-01-31 DIAGNOSIS — M899 Disorder of bone, unspecified: Secondary | ICD-10-CM | POA: Diagnosis not present

## 2013-01-31 DIAGNOSIS — M8580 Other specified disorders of bone density and structure, unspecified site: Secondary | ICD-10-CM

## 2013-01-31 DIAGNOSIS — Z853 Personal history of malignant neoplasm of breast: Secondary | ICD-10-CM | POA: Diagnosis not present

## 2013-01-31 DIAGNOSIS — Z1231 Encounter for screening mammogram for malignant neoplasm of breast: Secondary | ICD-10-CM | POA: Diagnosis not present

## 2013-02-01 ENCOUNTER — Telehealth: Payer: Self-pay | Admitting: Internal Medicine

## 2013-02-01 NOTE — Telephone Encounter (Signed)
Pharmacy is calling to confirm/ question the pt's recent chloral hydrate 500 MG/5ML syrup RX. Please assist.

## 2013-02-01 NOTE — Telephone Encounter (Signed)
Ok to refill and maiil to [facility]- will be last time it will be able to mail due to federal regulations

## 2013-02-03 ENCOUNTER — Telehealth: Payer: Self-pay | Admitting: Internal Medicine

## 2013-02-03 NOTE — Telephone Encounter (Signed)
Told pt taht results were back and released them so she could see but dr Lovell Sheehan would have to tellher the results

## 2013-02-03 NOTE — Telephone Encounter (Signed)
Pt is calling requesting bone density test. Pt [facility] test [facility].

## 2013-02-10 NOTE — Telephone Encounter (Signed)
Per dr Lovell Sheehan- bone density has worsened slightly--needs prolia , she can get here when she returns in the spring or we will send script for her to receive in [place].  Called pt ,but no answer

## 2013-02-10 NOTE — Telephone Encounter (Signed)
Pt states that she will be going to [facility], and will be leaving here Saturday. She would like to speak with Dr. Lovell Sheehan or someone in regards to her bone density results ASAP. Please assist.

## 2013-03-02 ENCOUNTER — Other Ambulatory Visit: Payer: Self-pay | Admitting: Gastroenterology

## 2013-03-02 ENCOUNTER — Telehealth: Payer: Self-pay | Admitting: Internal Medicine

## 2013-03-02 MED ORDER — ALPRAZOLAM 0.5 MG PO TABS: 0.5000 mg | ORAL_TABLET | Freq: Every evening | ORAL | Status: DC | PRN

## 2013-03-02 NOTE — Telephone Encounter (Signed)
Refilled Xanex for pt. Pharmacy to notify when ready

## 2013-03-08 DIAGNOSIS — C50419 Malignant neoplasm of upper-outer quadrant of unspecified female breast: Secondary | ICD-10-CM | POA: Diagnosis not present

## 2013-03-08 DIAGNOSIS — M810 Age-related osteoporosis without current pathological fracture: Secondary | ICD-10-CM | POA: Diagnosis not present

## 2013-03-31 DIAGNOSIS — R011 Cardiac murmur, unspecified: Secondary | ICD-10-CM | POA: Diagnosis not present

## 2013-03-31 DIAGNOSIS — J301 Allergic rhinitis due to pollen: Secondary | ICD-10-CM | POA: Diagnosis not present

## 2013-04-13 DIAGNOSIS — L821 Other seborrheic keratosis: Secondary | ICD-10-CM | POA: Diagnosis not present

## 2013-04-13 DIAGNOSIS — L739 Follicular disorder, unspecified: Secondary | ICD-10-CM | POA: Diagnosis not present

## 2013-04-13 DIAGNOSIS — D1801 Hemangioma of skin and subcutaneous tissue: Secondary | ICD-10-CM | POA: Diagnosis not present

## 2013-04-13 DIAGNOSIS — I781 Nevus, non-neoplastic: Secondary | ICD-10-CM | POA: Diagnosis not present

## 2013-04-13 DIAGNOSIS — L708 Other acne: Secondary | ICD-10-CM | POA: Diagnosis not present

## 2013-04-13 DIAGNOSIS — L570 Actinic keratosis: Secondary | ICD-10-CM | POA: Diagnosis not present

## 2013-04-13 DIAGNOSIS — L819 Disorder of pigmentation, unspecified: Secondary | ICD-10-CM | POA: Diagnosis not present

## 2013-04-13 DIAGNOSIS — D239 Other benign neoplasm of skin, unspecified: Secondary | ICD-10-CM | POA: Diagnosis not present

## 2013-05-04 DIAGNOSIS — B079 Viral wart, unspecified: Secondary | ICD-10-CM | POA: Diagnosis not present

## 2013-05-04 DIAGNOSIS — M2040 Other hammer toe(s) (acquired), unspecified foot: Secondary | ICD-10-CM | POA: Diagnosis not present

## 2013-05-18 DIAGNOSIS — H2589 Other age-related cataract: Secondary | ICD-10-CM | POA: Diagnosis not present

## 2013-05-18 DIAGNOSIS — H04129 Dry eye syndrome of unspecified lacrimal gland: Secondary | ICD-10-CM | POA: Diagnosis not present

## 2013-05-18 DIAGNOSIS — H348392 Tributary (branch) retinal vein occlusion, unspecified eye, stable: Secondary | ICD-10-CM | POA: Diagnosis not present

## 2013-05-18 DIAGNOSIS — H01009 Unspecified blepharitis unspecified eye, unspecified eyelid: Secondary | ICD-10-CM | POA: Diagnosis not present

## 2013-05-23 DIAGNOSIS — H348392 Tributary (branch) retinal vein occlusion, unspecified eye, stable: Secondary | ICD-10-CM | POA: Diagnosis not present

## 2013-05-24 ENCOUNTER — Telehealth: Payer: Self-pay | Admitting: Cardiovascular Disease

## 2013-05-24 NOTE — Telephone Encounter (Signed)
Spoke with patient who states she was referred to a retina specialist (Dr. 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  Patient states Dr. Neomia Dear implied that the arteries are not pumping well enough to the eye and she would like to know if Dr. Burt Knack thinks she needs to see a cardiologist in [facility].  Dr. Neomia Dear is following patient monthly for the next 3 months.  Patient states she is unavailable today until 4 pm.  I advised patient that I will send message to Dr. Burt Knack who is in the office today and will call her back.  Patient verbalized understanding and agreement.

## 2013-05-24 NOTE — Telephone Encounter (Signed)
New message   Patient in [place] need to call before 12 noon today .    Need Michelle or Dr. Burt Knack to call the eye doctor, is there any relationship between eye & heart.  Vein occulusion.    Dr. Neomia Dear  Office # [phone]

## 2013-05-26 NOTE — Telephone Encounter (Signed)
Spoke with patient and informed her of Dr. Antionette Char advice.  Patient states she is possibly going to go to a cardiologist in [facility] to get a second opinion.  Patient would like it documented that she will see Dr. Loreli Slot in [facility], [place] if she seeks a 2nd opinion.  I advised patient to call back for medical records release if she so chooses.  Patient verbalized understanding and agreement.

## 2013-05-26 NOTE — Telephone Encounter (Signed)
If no cardiac symptoms, I think ok to wait until she returns to [place]. Would obviously be ok to see a cardiologist in [place] if she is more comfortable with that and we could send records of recent testing.

## 2013-06-21 DIAGNOSIS — I359 Nonrheumatic aortic valve disorder, unspecified: Secondary | ICD-10-CM | POA: Diagnosis not present

## 2013-06-30 DIAGNOSIS — H348392 Tributary (branch) retinal vein occlusion, unspecified eye, stable: Secondary | ICD-10-CM | POA: Diagnosis not present

## 2013-08-01 DIAGNOSIS — H348392 Tributary (branch) retinal vein occlusion, unspecified eye, stable: Secondary | ICD-10-CM | POA: Diagnosis not present

## 2013-08-12 ENCOUNTER — Telehealth: Payer: Self-pay | Admitting: Internal Medicine

## 2013-08-12 NOTE — Telephone Encounter (Signed)
Pt states [facility] Latino has taken care of this rx for yrs . It needs to be renewed and sent to pharm. Pharm needs extra to fill this and pt would appreciate if you could do today. Med [id number] hydrate rx needs to state: 900 MI C-cholral hydrate 100 mg/41ml su  Needs to be faxed to  [facility] (pharm) [place] 3 mo supply

## 2013-08-15 ENCOUNTER — Telehealth: Payer: Self-pay | Admitting: Internal Medicine

## 2013-08-15 MED ORDER — CHLORAL HYDRATE 500 MG/5ML PO SYRP: 500.0000 mg | ORAL_SOLUTION | Freq: Every evening | ORAL | Status: DC | PRN

## 2013-08-15 NOTE — Telephone Encounter (Signed)
Pharmacy is needing clarification on the pt rx chloral hydrate 500 MG/5ML syrup, states he need verification on the quantity dispense, pharmacy states not a clear understanding on how to fill it based how it is written.

## 2013-08-15 NOTE — Telephone Encounter (Signed)
rx for chloral hydrate printed signed and faxed to pts pharmacy

## 2013-08-16 ENCOUNTER — Other Ambulatory Visit: Payer: Self-pay

## 2013-08-16 MED ORDER — CHLORAL HYDRATE 500 MG/5ML PO SYRP: 500.0000 mg | ORAL_SOLUTION | Freq: Every evening | ORAL | Status: DC | PRN

## 2013-08-16 NOTE — Telephone Encounter (Signed)
Quantity changed and sent into pharmacy

## 2013-08-25 ENCOUNTER — Telehealth: Payer: Self-pay | Admitting: Internal Medicine

## 2013-08-25 ENCOUNTER — Other Ambulatory Visit: Payer: Self-pay | Admitting: Gastroenterology

## 2013-08-25 MED ORDER — ALPRAZOLAM 0.5 MG PO TABS
0.5000 mg | ORAL_TABLET | Freq: Every evening | ORAL | Status: DC | PRN
Start: 2013-08-25 — End: 2013-08-26

## 2013-08-25 NOTE — Telephone Encounter (Signed)
Sent in [place] to pt's pharmacy

## 2013-08-26 ENCOUNTER — Other Ambulatory Visit: Payer: Self-pay

## 2013-08-26 DIAGNOSIS — Z853 Personal history of malignant neoplasm of breast: Secondary | ICD-10-CM | POA: Diagnosis not present

## 2013-08-26 DIAGNOSIS — Z8711 Personal history of peptic ulcer disease: Secondary | ICD-10-CM | POA: Diagnosis not present

## 2013-08-26 DIAGNOSIS — R55 Syncope and collapse: Secondary | ICD-10-CM | POA: Diagnosis not present

## 2013-08-26 DIAGNOSIS — R42 Dizziness and giddiness: Secondary | ICD-10-CM | POA: Diagnosis not present

## 2013-08-26 DIAGNOSIS — I498 Other specified cardiac arrhythmias: Secondary | ICD-10-CM | POA: Diagnosis not present

## 2013-08-26 DIAGNOSIS — R1115 Cyclical vomiting syndrome unrelated to migraine: Secondary | ICD-10-CM | POA: Diagnosis not present

## 2013-08-26 DIAGNOSIS — I2510 Atherosclerotic heart disease of native coronary artery without angina pectoris: Secondary | ICD-10-CM | POA: Diagnosis not present

## 2013-08-26 DIAGNOSIS — R112 Nausea with vomiting, unspecified: Secondary | ICD-10-CM | POA: Diagnosis not present

## 2013-08-26 DIAGNOSIS — I359 Nonrheumatic aortic valve disorder, unspecified: Secondary | ICD-10-CM | POA: Diagnosis not present

## 2013-08-26 DIAGNOSIS — E785 Hyperlipidemia, unspecified: Secondary | ICD-10-CM | POA: Diagnosis not present

## 2013-08-26 DIAGNOSIS — R9431 Abnormal electrocardiogram [ECG] [EKG]: Secondary | ICD-10-CM | POA: Diagnosis not present

## 2013-08-26 DIAGNOSIS — Z049 Encounter for examination and observation for unspecified reason: Secondary | ICD-10-CM | POA: Diagnosis not present

## 2013-08-26 MED ORDER — ALPRAZOLAM 0.5 MG PO TABS: 0.5000 mg | ORAL_TABLET | Freq: Every evening | ORAL | Status: DC | PRN

## 2013-08-27 DIAGNOSIS — I495 Sick sinus syndrome: Secondary | ICD-10-CM | POA: Diagnosis not present

## 2013-08-29 DIAGNOSIS — C50419 Malignant neoplasm of upper-outer quadrant of unspecified female breast: Secondary | ICD-10-CM | POA: Diagnosis not present

## 2013-09-01 DIAGNOSIS — L819 Disorder of pigmentation, unspecified: Secondary | ICD-10-CM | POA: Diagnosis not present

## 2013-09-01 DIAGNOSIS — B370 Candidal stomatitis: Secondary | ICD-10-CM | POA: Diagnosis not present

## 2013-09-01 DIAGNOSIS — L219 Seborrheic dermatitis, unspecified: Secondary | ICD-10-CM | POA: Diagnosis not present

## 2013-09-01 DIAGNOSIS — L820 Inflamed seborrheic keratosis: Secondary | ICD-10-CM | POA: Diagnosis not present

## 2013-09-01 DIAGNOSIS — D1801 Hemangioma of skin and subcutaneous tissue: Secondary | ICD-10-CM | POA: Diagnosis not present

## 2013-09-01 DIAGNOSIS — K130 Diseases of lips: Secondary | ICD-10-CM | POA: Diagnosis not present

## 2013-09-01 DIAGNOSIS — I781 Nevus, non-neoplastic: Secondary | ICD-10-CM | POA: Diagnosis not present

## 2013-09-05 ENCOUNTER — Telehealth: Payer: Self-pay | Admitting: Cardiovascular Disease

## 2013-09-05 NOTE — Telephone Encounter (Signed)
New message     Can pt play tennis this summer?

## 2013-09-06 NOTE — Telephone Encounter (Signed)
Last seen in September 2014. She is supposed to have 6 month follow-up. Stress test reviewed and was ok. Low-moderate physical activity is ok (doubles tennis). Please arrange FOV next available. thx

## 2013-09-07 NOTE — Telephone Encounter (Signed)
Left message on machine for pt to contact the office.   

## 2013-09-12 NOTE — Telephone Encounter (Signed)
I spoke with the [name] and made her aware of Dr Antionette Char comments.  The [name] said she had a spell in [place] a few weeks ago where her pulse dropped to 40 and she was diaphoretic and nauseated.  The [name]'s cardiologist did admit the [name] to the hospital for observation and they could not find a cause for event.  The [name] is going to contact her cardiologist to have records faxed to our office. The [name] is scheduled for an echo and OV in June.  I advised the [name] to hold off on playing tennis until further evaluation is complete. [name] agreed with plan.

## 2013-09-23 ENCOUNTER — Telehealth: Payer: Self-pay | Admitting: Internal Medicine

## 2013-09-23 NOTE — Telephone Encounter (Signed)
Dr. Lyla Son called to ask if you will take his friend as a patient.  She now sees Dr. Arnoldo Morale.

## 2013-09-26 ENCOUNTER — Ambulatory Visit (HOSPITAL_COMMUNITY): Payer: Medicare Other | Attending: Cardiovascular Disease | Admitting: Radiology

## 2013-09-26 ENCOUNTER — Other Ambulatory Visit (HOSPITAL_COMMUNITY): Payer: Self-pay | Admitting: Cardiovascular Disease

## 2013-09-26 DIAGNOSIS — I079 Rheumatic tricuspid valve disease, unspecified: Secondary | ICD-10-CM | POA: Insufficient documentation

## 2013-09-26 DIAGNOSIS — I359 Nonrheumatic aortic valve disorder, unspecified: Secondary | ICD-10-CM

## 2013-09-26 DIAGNOSIS — Z87891 Personal history of nicotine dependence: Secondary | ICD-10-CM | POA: Insufficient documentation

## 2013-09-26 DIAGNOSIS — E7800 Pure hypercholesterolemia, unspecified: Secondary | ICD-10-CM | POA: Insufficient documentation

## 2013-09-26 DIAGNOSIS — Z853 Personal history of malignant neoplasm of breast: Secondary | ICD-10-CM | POA: Insufficient documentation

## 2013-09-26 NOTE — Telephone Encounter (Signed)
Yes, but i would also recommend pt establish with a different PCP due to my anticipated limited clinic availability in the near future -  please be sure Dr Inocente Salles knows same (ie, pt may need to change PCP again if she chooses me) thanks

## 2013-09-26 NOTE — Progress Notes (Signed)
Echocardiogram performed.  

## 2013-09-27 NOTE — Telephone Encounter (Signed)
Pt is aware she may need to change PCP again.  Scheduled for Aug. 6 with Dr. Asa Lente.  Dr. Inocente Salles is aware that Dr. Asa Lente may be cutting back her hours.

## 2013-09-28 DIAGNOSIS — K130 Diseases of lips: Secondary | ICD-10-CM | POA: Diagnosis not present

## 2013-09-28 DIAGNOSIS — L253 Unspecified contact dermatitis due to other chemical products: Secondary | ICD-10-CM | POA: Diagnosis not present

## 2013-09-29 ENCOUNTER — Telehealth: Payer: Self-pay | Admitting: Internal Medicine

## 2013-09-29 MED ORDER — CHLORAL HYDRATE 500 MG/5ML PO SYRP: 500.0000 mg | ORAL_SOLUTION | Freq: Every evening | ORAL | Status: DC | PRN

## 2013-09-29 NOTE — Telephone Encounter (Signed)
Pt is needing new rx chloral hydrate 500 MG/5ML syrup, [facility] on [address] Pt states  Her rx states oral solution 100mg /mi 900 ml.[phone]

## 2013-09-29 NOTE — Telephone Encounter (Signed)
rx faxed to [facility]

## 2013-10-03 DIAGNOSIS — I499 Cardiac arrhythmia, unspecified: Secondary | ICD-10-CM | POA: Diagnosis not present

## 2013-10-03 DIAGNOSIS — J301 Allergic rhinitis due to pollen: Secondary | ICD-10-CM | POA: Diagnosis not present

## 2013-10-06 ENCOUNTER — Ambulatory Visit (INDEPENDENT_AMBULATORY_CARE_PROVIDER_SITE_OTHER): Payer: Medicare Other | Admitting: Cardiovascular Disease

## 2013-10-06 ENCOUNTER — Encounter: Payer: Self-pay | Admitting: Internal Medicine

## 2013-10-06 ENCOUNTER — Encounter: Payer: Self-pay | Admitting: Cardiovascular Disease

## 2013-10-06 VITALS — BP 124/82 | HR 61 | Ht 65.5 in | Wt 151.0 lb

## 2013-10-06 DIAGNOSIS — I359 Nonrheumatic aortic valve disorder, unspecified: Secondary | ICD-10-CM | POA: Diagnosis not present

## 2013-10-06 DIAGNOSIS — R0602 Shortness of breath: Secondary | ICD-10-CM | POA: Diagnosis not present

## 2013-10-06 DIAGNOSIS — I350 Nonrheumatic aortic (valve) stenosis: Secondary | ICD-10-CM

## 2013-10-06 LAB — BRAIN NATRIURETIC PEPTIDE: Pro B Natriuretic peptide (BNP): 51 pg/mL (ref 0.0–100.0)

## 2013-10-06 NOTE — Patient Instructions (Addendum)
Your physician has requested that you have an exercise tolerance test with Dr Burt Knack. For further information please visit [email or web address]. Please also follow instruction sheet, as given.  Your physician recommends that you have lab work today: BNP  Your physician wants you to follow-up in: 1 YEAR with Dr Burt Knack.  You will receive a reminder letter in the mail two months in advance. If you don't receive a letter, please call our office to schedule the follow-up appointment.  Your physician has requested that you have an echocardiogram in 1 YEAR. Echocardiography is a painless test that uses sound waves to create images of your heart. It provides your doctor with information about the size and shape of your heart and how well your heart's chambers and valves are working. This procedure takes approximately one hour. There are no restrictions for this procedure.  Your physician recommends that you continue on your current medications as directed. Please refer to the Current Medication list given to you today.

## 2013-10-12 DIAGNOSIS — H349 Unspecified retinal vascular occlusion: Secondary | ICD-10-CM | POA: Diagnosis not present

## 2013-10-12 DIAGNOSIS — D3190 Benign neoplasm of unspecified part of unspecified eye: Secondary | ICD-10-CM | POA: Diagnosis not present

## 2013-10-12 DIAGNOSIS — H348392 Tributary (branch) retinal vein occlusion, unspecified eye, stable: Secondary | ICD-10-CM | POA: Insufficient documentation

## 2013-10-12 DIAGNOSIS — D3141 Benign neoplasm of right ciliary body: Secondary | ICD-10-CM | POA: Insufficient documentation

## 2013-10-14 ENCOUNTER — Encounter: Payer: Self-pay | Admitting: Cardiovascular Disease

## 2013-10-14 NOTE — Progress Notes (Signed)
HPI:  80 year old woman presenting for followup evaluation. The patient is followed for severe aortic stenosis. She is thought to have a bicuspid aortic valve. She also has bileaflet mitral valve prolapse with mild mitral regurgitation.  The patient has recently returned from [place], where she spends the winters. She did well with the exception of 1 near syncopal episode. Just after lunch one day, she began to feel dizzy and nauseated. She had an episode of vomiting. She was pale and diaphoretic and also noted to be bradycardic. She was hospitalized for observation and there were no significant abnormalities found. It was presumed that she had a neurodepressor event.  The patient has had no recurrence of these symptoms. She is physically active and reports no symptoms with physical exertion. She denies chest pain, chest pressure, dyspnea, lightheadedness, or syncope.  Outpatient Encounter Prescriptions as of 10/06/2013  Medication Sig  . LORazepam (ATIVAN) 0.5 MG tablet Take 0.5 mg by mouth as needed for anxiety.  . ALPRAZolam (XANAX) 0.5 MG tablet Take 1 tablet (0.5 mg total) by mouth at bedtime as needed for sleep.  Marland Kitchen CALCIUM CITRATE PO Take 500 mg by mouth daily.  . chloral hydrate 500 MG/5ML syrup Take 5 mLs (500 mg total) by mouth at bedtime as needed for sleep.  . Cholecalciferol (VITAMIN D-3) 1000 UNITS CAPS Take by mouth 1 day or 1 dose.  . Magnesium 400 MG CAPS Take by mouth daily.  . Multiple Vitamins-Minerals (CENTRUM SILVER PO) Take by mouth.  . Red Yeast Rice Extract (CVS RED YEAST RICE PO) Take 600 mg by mouth daily.   . [DISCONTINUED] DOXYCYCLINE HYCLATE PO Take 50 mg by mouth daily.  . [DISCONTINUED] ranitidine (ZANTAC) 300 MG capsule Take 300 mg by mouth every evening.    Allergies  Allergen Reactions  . Amoxicillin-Pot Clavulanate     REACTION: hives  . Penicillins Hives  . Sulfonamide Derivatives     REACTION: hives    Past Medical History  Diagnosis Date  .  Hypercholesteremia   . Osteopenia   . Ulcer 2012    bleeding gastric Ulcer  . Cancer     rt breast  . Aortic valve stenosis, severe 12/02/2012  . Aortic valve stenosis, severe     ROS: Negative except as per HPI  BP 124/82  Pulse 61  Ht 5' 5.5" (1.664 m)  Wt 68.493 kg (151 lb)  BMI 24.74 kg/m2  PHYSICAL EXAM: Pt is alert and oriented, pleasant, healthy-appearing elderly woman in NAD HEENT: normal Neck: JVP - normal, carotids 2+=  Lungs: CTA bilaterally CV: RRR with grade 3/6 harsh late peaking systolic murmur at the right upper sternal border Abd: soft, NT, Positive BS, no hepatomegaly Ext: no C/C/E, distal pulses intact and equal Skin: warm/dry no rash  EKG:  EKG from 08/26/2013 reviewed and shows sinus bradycardia with PACs and nonspecific T wave abnormality  ASSESSMENT AND PLAN: 80 year old woman with severe bicuspid valve aortic stenosis. The patient appears to be stable with no symptoms related to her aortic stenosis. I agree that her near syncopal event in [place] sounds consistent with a vasovagal event. She does not have any symptoms with physical exertion. A BNP was checked to evaluate for subclinical LV strain and this was again normal. I will check an exercise treadmill study to exclude any high risk features with physical exertion. I have asked her to continue to limit her exercise to moderate level activities.  [name] 10/14/2013 9:07 AM

## 2013-11-01 DIAGNOSIS — H348192 Central retinal vein occlusion, unspecified eye, stable: Secondary | ICD-10-CM | POA: Diagnosis not present

## 2013-11-01 DIAGNOSIS — H43819 Vitreous degeneration, unspecified eye: Secondary | ICD-10-CM | POA: Diagnosis not present

## 2013-11-01 DIAGNOSIS — D3190 Benign neoplasm of unspecified part of unspecified eye: Secondary | ICD-10-CM | POA: Diagnosis not present

## 2013-11-04 ENCOUNTER — Ambulatory Visit (INDEPENDENT_AMBULATORY_CARE_PROVIDER_SITE_OTHER): Payer: Medicare Other | Admitting: Cardiovascular Disease

## 2013-11-04 DIAGNOSIS — I350 Nonrheumatic aortic (valve) stenosis: Secondary | ICD-10-CM

## 2013-11-04 DIAGNOSIS — I359 Nonrheumatic aortic valve disorder, unspecified: Secondary | ICD-10-CM

## 2013-11-04 NOTE — Progress Notes (Signed)
Exercise Treadmill Test  Pre-Exercise Testing Evaluation Rhythm: normal sinus  Rate: 62 bpm     Test  Exercise Tolerance Test Ordering MD: [name], MD  Interpreting MD: [name], MD  Unique Test No: 1  Treadmill:  1  Indication for ETT: aoritc stenosis  Contraindication to ETT: No   Stress Modality: exercise - treadmill  Cardiac Imaging Performed: non   Protocol: standard Bruce - maximal  Max BP:  204/112  Max MPHR (bpm):  142 85% MPR (bpm):  121  MPHR obtained (bpm):  116 % MPHR obtained:  81  Reached 85% MPHR (min:sec):  NA Total Exercise Time (min-sec):  6:00  Workload in METS:  7.0 Borg Scale: 11  Reason ETT Terminated:  exaggerated hypertensive response    ST Segment Analysis At Rest: normal ST segments - no evidence of significant ST depression With Exercise: no evidence of significant ST depression  Other Information Arrhythmia:  Yes Angina during ETT:  absent (0) Quality of ETT:  diagnostic  ETT Interpretation:  normal - no evidence of ischemia by ST analysis  Comments: 1. Fair exercise tolerance 2. Hypertensive response to exercise 3. No angina or significant ST depression with exercise 4. Transient ventricular bigeminy during recovery, asymptomatic  Recommendations: Continue observation of severe asymptomatic AS. Will repeat an echo and GXT in one year. Resting BP has been normal, so will not start antihypertensive Rx for now.

## 2013-11-08 ENCOUNTER — Other Ambulatory Visit: Payer: Self-pay | Admitting: *Deleted

## 2013-11-08 ENCOUNTER — Ambulatory Visit (HOSPITAL_BASED_OUTPATIENT_CLINIC_OR_DEPARTMENT_OTHER): Payer: Medicare Other | Admitting: Adult Health

## 2013-11-08 ENCOUNTER — Encounter: Payer: Self-pay | Admitting: Adult Health

## 2013-11-08 ENCOUNTER — Other Ambulatory Visit (HOSPITAL_BASED_OUTPATIENT_CLINIC_OR_DEPARTMENT_OTHER): Payer: Medicare Other

## 2013-11-08 VITALS — BP 135/70 | HR 61 | Temp 97.7°F | Resp 18 | Ht 65.5 in | Wt 156.9 lb

## 2013-11-08 DIAGNOSIS — C50911 Malignant neoplasm of unspecified site of right female breast: Secondary | ICD-10-CM

## 2013-11-08 DIAGNOSIS — M810 Age-related osteoporosis without current pathological fracture: Secondary | ICD-10-CM

## 2013-11-08 DIAGNOSIS — Z853 Personal history of malignant neoplasm of breast: Secondary | ICD-10-CM

## 2013-11-08 LAB — COMPREHENSIVE METABOLIC PANEL (CC13)
ALT: 17 U/L (ref 0–55)
ANION GAP: 8 meq/L (ref 3–11)
AST: 22 U/L (ref 5–34)
Albumin: 3.9 g/dL (ref 3.5–5.0)
Alkaline Phosphatase: 79 U/L (ref 40–150)
BILIRUBIN TOTAL: 0.62 mg/dL (ref 0.20–1.20)
BUN: 12.7 mg/dL (ref 7.0–26.0)
CALCIUM: 9.1 mg/dL (ref 8.4–10.4)
CHLORIDE: 102 meq/L (ref 98–109)
CO2: 27 mEq/L (ref 22–29)
CREATININE: 0.8 mg/dL (ref 0.6–1.1)
Glucose: 110 mg/dl (ref 70–140)
Potassium: 4 mEq/L (ref 3.5–5.1)
Sodium: 137 mEq/L (ref 136–145)
Total Protein: 6.3 g/dL — ABNORMAL LOW (ref 6.4–8.3)

## 2013-11-08 LAB — CBC WITH DIFFERENTIAL/PLATELET
BASO%: 0.8 % (ref 0.0–2.0)
BASOS ABS: 0 10*3/uL (ref 0.0–0.1)
EOS%: 0.9 % (ref 0.0–7.0)
Eosinophils Absolute: 0.1 10*3/uL (ref 0.0–0.5)
HEMATOCRIT: 42.3 % (ref 34.8–46.6)
HEMOGLOBIN: 13.9 g/dL (ref 11.6–15.9)
LYMPH#: 1.8 10*3/uL (ref 0.9–3.3)
LYMPH%: 30.5 % (ref 14.0–49.7)
MCH: 31.3 pg (ref 25.1–34.0)
MCHC: 32.8 g/dL (ref 31.5–36.0)
MCV: 95.4 fL (ref 79.5–101.0)
MONO#: 0.4 10*3/uL (ref 0.1–0.9)
MONO%: 7.2 % (ref 0.0–14.0)
NEUT#: 3.6 10*3/uL (ref 1.5–6.5)
NEUT%: 60.6 % (ref 38.4–76.8)
PLATELETS: 166 10*3/uL (ref 145–400)
RBC: 4.43 10*6/uL (ref 3.70–5.45)
RDW: 13.5 % (ref 11.2–14.5)
WBC: 6 10*3/uL (ref 3.9–10.3)

## 2013-11-08 NOTE — Patient Instructions (Signed)
You are doing well.  You have no sign of recurrence.  I recommend healthy diet, exercise, and monthly breast exams.  We will see you back in 1 year.  Please call us if you have any questions or concerns.    Breast Self-Awareness Practicing breast self-awareness may pick up problems early, prevent significant medical complications, and possibly save your life. By practicing breast self-awareness, you can become familiar with how your breasts look and feel and if your breasts are changing. This allows you to notice changes early. It can also offer you some reassurance that your breast health is good. One way to learn what is normal for your breasts and whether your breasts are changing is to do a breast self-exam. If you find a lump or something that was not present in the past, it is best to contact your caregiver right away. Other findings that should be evaluated by your caregiver include nipple discharge, especially if it is bloody; skin changes or reddening; areas where the skin seems to be pulled in (retracted); or new lumps and bumps. Breast pain is seldom associated with cancer (malignancy), but should also be evaluated by a caregiver. HOW TO PERFORM A BREAST SELF-EXAM The best time to examine your breasts is 5-7 days after your menstrual period is over. During menstruation, the breasts are lumpier, and it may be more difficult to pick up changes. If you do not menstruate, have reached menopause, or had your uterus removed (hysterectomy), you should examine your breasts at regular intervals, such as monthly. If you are breastfeeding, examine your breasts after a feeding or after using a breast pump. Breast implants do not decrease the risk for lumps or tumors, so continue to perform breast self-exams as recommended. Talk to your caregiver about how to determine the difference between the implant and breast tissue. Also, talk about the amount of pressure you should use during the exam. Over time, you will  become more familiar with the variations of your breasts and more comfortable with the exam. A breast self-exam requires you to remove all your clothes above the waist. 1. Look at your breasts and nipples. Stand in front of a mirror in a room with good lighting. With your hands on your hips, push your hands firmly downward. Look for a difference in shape, contour, and size from one breast to the other (asymmetry). Asymmetry includes puckers, dips, or bumps. Also, look for skin changes, such as reddened or scaly areas on the breasts. Look for nipple changes, such as discharge, dimpling, repositioning, or redness. 2. Carefully feel your breasts. This is best done either in the shower or tub while using soapy water or when flat on your back. Place the arm (on the side of the breast you are examining) above your head. Use the pads (not the fingertips) of your three middle fingers on your opposite hand to feel your breasts. Start in the underarm area and use  inch (2 cm) overlapping circles to feel your breast. Use 3 different levels of pressure (light, medium, and firm pressure) at each circle before moving to the next circle. The light pressure is needed to feel the tissue closest to the skin. The medium pressure will help to feel breast tissue a little deeper, while the firm pressure is needed to feel the tissue close to the ribs. Continue the overlapping circles, moving downward over the breast until you feel your ribs below your breast. Then, move one finger-width towards the center of the body.  Continue to use the  inch (2 cm) overlapping circles to feel your breast as you move slowly up toward the collar bone (clavicle) near the base of the neck. Continue the up and down exam using all 3 pressures until you reach the middle of the chest. Do this with each breast, carefully feeling for lumps or changes. 3.  Keep a written record with breast changes or normal findings for each breast. By writing this  information down, you do not need to depend only on memory for size, tenderness, or location. Write down where you are in your menstrual cycle, if you are still menstruating. Breast tissue can have some lumps or thick tissue. However, see your caregiver if you find anything that concerns you.  SEEK MEDICAL CARE IF:  You see a change in shape, contour, or size of your breasts or nipples.   You see skin changes, such as reddened or scaly areas on the breasts or nipples.   You have an unusual discharge from your nipples.   You feel a new lump or unusually thick areas.  Document Released: 04/14/2005 Document Revised: 03/31/2012 Document Reviewed: 07/30/2011 [facility] Patient Information 2015 [place], [place]. This information is not intended to replace advice given to you by your health care provider. Make sure you discuss any questions you have with your health care provider.

## 2013-11-08 NOTE — Progress Notes (Signed)
OFFICE PROGRESS NOTE  CC**  Pamela EDWARD, MD [address] [place] [address]  DIAGNOSIS:  80 y/o with h/o stage IA invasive lobular carcinoma of the right breast ER positive, PR negative, HER-2/neu negative diagnosed in 2005.    PRIOR THERAPY:  1. Patient underwent screening mammogram that revealed a right breast mass, right diagnostic mammo and ultrasound confirmed its presence, and patient was biopsied on 02/16/2004 and revealed invasive mammary carcinoma.  MRI showed a 13 mm mass in the upper inner quadrant of the right breast.    2.  Lumpectomy was performed on 03/05/2004.  A 0.9cm tumor was removed.  Grade 1 invasive lobular carcinoma, ER positive, PR negative, HER-2/neu negative with a Ki-67 of 5%.  Sentineal nodes were negative.    3. Patient underwent radiation therapy in [place], [place] from 03/2004 through January 2006.    4. She was later enrolled on the MA-27 trial and placed on the Aromasin arm.  She completed one year of Aromasin, and it was discontinued due to severe joint aches and pains, followed by Tamoxifen for 2-3 years, and then Letrozole to complete out a total of 5 years of anti-estrogen therapy in 2011.   CURRENT THERAPY: Observation  INTERVAL HISTORY: [name] 80 y.o. female returns for f/u today for evaluation of her h/o right breast cancer.  She is doing well today.  She denies fevers, chills, nausea, vomiting, new pain, unexpected weight loss, or any further concerns.    MEDICAL HISTORY: Past Medical History  Diagnosis Date  . Hypercholesteremia   . Osteopenia   . Ulcer 2012    bleeding gastric Ulcer  . Cancer     rt breast  . Aortic valve stenosis, severe 12/02/2012  . Aortic valve stenosis, severe     ALLERGIES:  is allergic to amoxicillin-pot clavulanate; penicillins; and sulfonamide derivatives.  MEDICATIONS:  Current Outpatient Prescriptions  Medication Sig Dispense Refill  . ALPRAZolam (XANAX) 0.5 MG tablet  Take 1 tablet (0.5 mg total) by mouth at bedtime as needed for sleep.  30 tablet  3  . CALCIUM CITRATE PO Take 500 mg by mouth daily.      . chloral hydrate 500 MG/5ML syrup Take 5 mLs (500 mg total) by mouth at bedtime as needed for sleep.  900 mL  0  . Cholecalciferol (VITAMIN D-3) 1000 UNITS CAPS Take by mouth 1 day or 1 dose.      . fluticasone (FLONASE) 50 MCG/ACT nasal spray       . LORazepam (ATIVAN) 0.5 MG tablet Take 0.5 mg by mouth as needed for anxiety.      . Magnesium 400 MG CAPS Take by mouth daily.      . montelukast (SINGULAIR) 10 MG tablet       . Multiple Vitamins-Minerals (CENTRUM SILVER PO) Take by mouth.      . Red Yeast Rice Extract ([employer] RED YEAST RICE PO) Take 600 mg by mouth daily.       . hydrocortisone 2.5 % cream        No current facility-administered medications for this visit.    SURGICAL HISTORY:  Past Surgical History  Procedure Laterality Date  . Breast surgery    . Tonsillectomy    . Parathyroidectomy    . Cosmetic surgery      face    REVIEW OF SYSTEMS:  A 10 point review of systems was conducted and is otherwise negative except for what is noted above.      HEALTH MAINTENANCE: Mammogram 01/2013 Colonoscopy 5 years ago, no f/u recommended Bone Density 01/2013 Eye Exam several per year Lipid Panel scheduled in August 2014  PHYSICAL EXAMINATION: Blood pressure 135/70, pulse 61, temperature 97.7 F (36.5 C), temperature source Oral, resp. rate 18, height 5' 5.5" (1.664 m), weight 156 lb 14.4 oz (71.169 kg). Body mass index is 25.7 kg/(m^2). General: Patient is a well appearing female in no acute distress HEENT: PERRLA, sclerae anicteric no conjunctival pallor, MMM Neck: supple, no palpable adenopathy Lungs: clear to auscultation bilaterally, no wheezes, rhonchi, or rales Cardiovascular: regular rate rhythm, S1, S2, no murmurs, rubs or gallops Abdomen: Soft, non-tender, non-distended, normoactive bowel sounds, no HSM Extremities: warm and well  perfused, no clubbing, cyanosis, or edema Skin: No rashes or lesions Neuro: Non-focal Breasts: Right breast lumpectomy site well healed, no nodularity, mass, recurrence, left breast no nodules, masses, skin/nipple changes ECOG PERFORMANCE STATUS: 0 - Asymptomatic    LABORATORY DATA: Lab Results  Component Value Date   WBC 6.0 11/08/2013   HGB 13.9 11/08/2013   HCT 42.3 11/08/2013   MCV 95.4 11/08/2013   PLT 166 11/08/2013      Chemistry      Component Value Date/Time   NA 138 11/02/2012 1431   NA 140 10/07/2011 1035   K 3.9 11/02/2012 1431   K 4.1 10/07/2011 1035   CL 105 10/07/2011 1035   CO2 28 11/02/2012 1431   CO2 24 10/07/2011 1035   BUN 18.1 11/02/2012 1431   BUN 18 10/07/2011 1035   CREATININE 0.8 11/02/2012 1431   CREATININE 0.78 10/07/2011 1035      Component Value Date/Time   CALCIUM 9.2 11/02/2012 1431   CALCIUM 9.1 10/07/2011 1035   ALKPHOS 80 11/02/2012 1431   ALKPHOS 76 10/07/2011 1035   AST 27 11/02/2012 1431   AST 21 10/07/2011 1035   ALT 25 11/02/2012 1431   ALT 14 10/07/2011 1035   BILITOT 0.73 11/02/2012 1431   BILITOT 0.5 10/07/2011 1035       RADIOGRAPHIC STUDIES:  No results found.  ASSESSMENT: Patient is a 80 year old female with history of stage IA invasive lobular carcinoma of the right breast.  ER positive, PR negative, HER-2/neu negative, with a Ki-67 of 5%.  She has underwent adjuvant radiation, and has completed 5 years of anti-estrogen therapy.  She is current on observation with annual mammograms.     PLAN:   [name] is doing well from a breast cancer stand point.  She has no sign of recurrence.  Her health maintenance is up to date and we reviewed this in detail.  I did order her mammogram due in October of this year.  I recommended healthy diet, exercise and monthly breast exams.    [name] will return in one year for follow up. She did get lab work drawn today which had not yet resulted at the completion of her visit.  We will call her with results.    All  questions were answered. The patient knows to call the clinic with any problems, questions or concerns. We can certainly see the patient much sooner if necessary.  I spent 25 minutes counseling the patient face to face. The total time spent in the appointment was 30 minutes.  Minette Headland, [place] [phone] 11/08/2013, 3:19 PM

## 2013-11-09 ENCOUNTER — Telehealth: Payer: Self-pay | Admitting: *Deleted

## 2013-11-09 DIAGNOSIS — L253 Unspecified contact dermatitis due to other chemical products: Secondary | ICD-10-CM | POA: Diagnosis not present

## 2013-11-09 DIAGNOSIS — L219 Seborrheic dermatitis, unspecified: Secondary | ICD-10-CM | POA: Diagnosis not present

## 2013-11-09 DIAGNOSIS — L570 Actinic keratosis: Secondary | ICD-10-CM | POA: Diagnosis not present

## 2013-11-09 DIAGNOSIS — L259 Unspecified contact dermatitis, unspecified cause: Secondary | ICD-10-CM | POA: Diagnosis not present

## 2013-11-09 LAB — CANCER ANTIGEN 27.29: CA 27.29: 22 U/mL (ref 0–39)

## 2013-11-09 NOTE — Telephone Encounter (Signed)
Called pt to inform her of lab results and CA 27.29(22) which were all normal. Communicated with pt in detail about lab values. Pt was pleased with results and verbalized understanding. No further concerns. Message to be forwarded to Charlestine Massed, NP.

## 2013-11-14 ENCOUNTER — Telehealth: Payer: Self-pay | Admitting: Hematology and Oncology

## 2013-11-14 ENCOUNTER — Telehealth: Payer: Self-pay | Admitting: Adult Health

## 2013-11-14 NOTE — Telephone Encounter (Signed)
pt called to r/s 2016 appts. per pt 7/7 lab moved to 7/5 and 7/14 f/u moved to 7/12. pt needs AM or Tuesday afternoons.

## 2013-11-14 NOTE — Telephone Encounter (Signed)
lvm for pt regarding to OCT and July 2016 appt....mailed pt appt sched/avs adn letter

## 2013-11-25 ENCOUNTER — Encounter: Payer: Self-pay | Admitting: Gastroenterology

## 2013-11-30 DIAGNOSIS — L219 Seborrheic dermatitis, unspecified: Secondary | ICD-10-CM | POA: Diagnosis not present

## 2013-11-30 DIAGNOSIS — L819 Disorder of pigmentation, unspecified: Secondary | ICD-10-CM | POA: Diagnosis not present

## 2013-11-30 DIAGNOSIS — D239 Other benign neoplasm of skin, unspecified: Secondary | ICD-10-CM | POA: Diagnosis not present

## 2013-11-30 DIAGNOSIS — K130 Diseases of lips: Secondary | ICD-10-CM | POA: Diagnosis not present

## 2013-11-30 DIAGNOSIS — L821 Other seborrheic keratosis: Secondary | ICD-10-CM | POA: Diagnosis not present

## 2013-12-01 ENCOUNTER — Ambulatory Visit (INDEPENDENT_AMBULATORY_CARE_PROVIDER_SITE_OTHER): Payer: Medicare Other | Admitting: Internal Medicine

## 2013-12-01 ENCOUNTER — Encounter: Payer: Self-pay | Admitting: Internal Medicine

## 2013-12-01 ENCOUNTER — Other Ambulatory Visit (INDEPENDENT_AMBULATORY_CARE_PROVIDER_SITE_OTHER): Payer: Medicare Other

## 2013-12-01 VITALS — BP 122/76 | HR 68 | Temp 97.9°F | Ht 65.5 in | Wt 155.4 lb

## 2013-12-01 DIAGNOSIS — I359 Nonrheumatic aortic valve disorder, unspecified: Secondary | ICD-10-CM | POA: Diagnosis not present

## 2013-12-01 DIAGNOSIS — Z0000 Encounter for general adult medical examination without abnormal findings: Secondary | ICD-10-CM

## 2013-12-01 DIAGNOSIS — M949 Disorder of cartilage, unspecified: Secondary | ICD-10-CM

## 2013-12-01 DIAGNOSIS — M8580 Other specified disorders of bone density and structure, unspecified site: Secondary | ICD-10-CM

## 2013-12-01 DIAGNOSIS — I350 Nonrheumatic aortic (valve) stenosis: Secondary | ICD-10-CM

## 2013-12-01 DIAGNOSIS — E785 Hyperlipidemia, unspecified: Secondary | ICD-10-CM

## 2013-12-01 DIAGNOSIS — M899 Disorder of bone, unspecified: Secondary | ICD-10-CM

## 2013-12-01 LAB — TSH: TSH: 0.96 u[IU]/mL (ref 0.35–4.50)

## 2013-12-01 LAB — LIPID PANEL
CHOL/HDL RATIO: 3
Cholesterol: 250 mg/dL — ABNORMAL HIGH (ref 0–200)
HDL: 83 mg/dL (ref >39.00)
LDL Cholesterol: 151 mg/dL — ABNORMAL HIGH (ref 0–99)
NONHDL: 167
TRIGLYCERIDES: 79 mg/dL (ref 0.0–149.0)
VLDL: 15.8 mg/dL (ref 0.0–40.0)

## 2013-12-01 LAB — VITAMIN D 25 HYDROXY (VIT D DEFICIENCY, FRACTURES): VITD: 48.69 ng/mL (ref 30.00–100.00)

## 2013-12-01 MED ORDER — CHLORAL HYDRATE 500 MG/5ML PO SYRP: 500.0000 mg | ORAL_SOLUTION | Freq: Every evening | ORAL | Status: DC | PRN

## 2013-12-01 NOTE — Progress Notes (Signed)
Subjective:    Patient ID: [name], female    DOB: 04-26-1933, 80 y.o.   MRN: [id number]  HPI   Here for medicare wellness  Diet: heart healthy or DM if diabetic Physical activity: sedentary Depression/mood screen: negative Hearing: intact to whispered voice Visual acuity: grossly normal, performs annual eye exam  ADLs: capable Fall risk: none Home safety: good Cognitive evaluation: intact to orientation, naming, recall and repetition EOL planning: adv directives, full code/ I agree  I have personally reviewed and have noted 1. The patient's medical and social history 2. Their use of alcohol, tobacco or illicit drugs 3. Their current medications and supplements 4. The patient's functional ability including ADL's, fall risks, home safety risks and hearing or visual impairment. 5. Diet and physical activities 6. Evidence for depression or mood disorders  Also reviewed chronic medical issues and interval medical events  Past Medical History  Diagnosis Date  . Hypercholesteremia   . Osteopenia   . Ulcer 2012    bleeding gastric Ulcer  . Cancer     rt breast  . Aortic valve stenosis, severe    Family History  Problem Relation Age of Onset  . Hypertension Mother   . Rectal cancer Mother   . Kidney disease Father    History  Substance Use Topics  . Smoking status: Former Research scientist (life sciences)  . Smokeless tobacco: Never Used  . Alcohol Use: 4.2 oz/week    7 Glasses of wine per week    Review of Systems  Constitutional: Negative for fatigue and unexpected weight change.  Respiratory: Negative for cough, shortness of breath and wheezing.   Cardiovascular: Negative for chest pain, palpitations and leg swelling.  Gastrointestinal: Negative for nausea, abdominal pain and diarrhea.  Neurological: Negative for dizziness, weakness, light-headedness and headaches.  Psychiatric/Behavioral: Negative for dysphoric mood. The patient is not nervous/anxious.   All other systems  reviewed and are negative.      Objective:   Physical Exam  BP 122/76  Pulse 68  Temp(Src) 97.9 F (36.6 C) (Oral)  Ht 5' 5.5" (1.664 m)  Wt 155 lb 6 oz (70.478 kg)  BMI 25.45 kg/m2  SpO2 96% Wt Readings from Last 3 Encounters:  12/01/13 155 lb 6 oz (70.478 kg)  11/08/13 156 lb 14.4 oz (71.169 kg)  10/06/13 151 lb (68.493 kg)   Constitutional: She appears well-developed and well-nourished. No distress.  HENT: Head: Normocephalic and atraumatic. Ears: B TMs ok, no erythema or effusion; Nose: Nose normal. Mouth/Throat: Oropharynx is clear and moist. No oropharyngeal exudate.  Eyes: Conjunctivae and EOM are normal. Pupils are equal, round, and reactive to light. No scleral icterus.  Neck: Normal range of motion. Neck supple. No JVD present. No thyromegaly present.  Cardiovascular: Normal rate, regular rhythm and normal heart sounds.  4/6 systolic murmur heard. No BLE edema. Pulmonary/Chest: Effort normal and breath sounds normal. No respiratory distress. She has no wheezes.  Abdominal: Soft. Bowel sounds are normal. She exhibits no distension. There is no tenderness. no masses GU/breast: defer to gyn Musculoskeletal: Normal range of motion, no joint effusions. No gross deformities Neurological: She is alert and oriented to person, place, and time. No cranial nerve deficit. Coordination, balance, strength, speech and gait are normal.  Skin: Skin is warm and dry. No rash noted. No erythema.  Psychiatric: She has a normal mood and affect. Her behavior is normal. Judgment and thought content normal.    Lab Results  Component Value Date   WBC 6.0 11/08/2013  HGB 13.9 11/08/2013   HCT 42.3 11/08/2013   PLT 166 11/08/2013   GLUCOSE 110 11/08/2013   CHOL 251* 11/29/2012   TRIG 98.0 11/29/2012   HDL 86.20 11/29/2012   LDLDIRECT 144.0 11/29/2012   LDLCALC 182* 11/09/2009   ALT 17 11/08/2013   AST 22 11/08/2013   NA 137 11/08/2013   K 4.0 11/08/2013   CL 105 10/07/2011   CREATININE 0.8 11/08/2013     BUN 12.7 11/08/2013   CO2 27 11/08/2013   TSH 0.79 11/29/2012   INR 1.06 12/06/2010    Dg Bone Density  01/31/2013   *RADIOLOGY REPORT*  Clinical Data: 80 year old post menopausal Caucasian female.  No personal fragility fracture history.  The patient has a personal history of breast cancer and has undergone previous tamoxifen anti estrogen therapy.  Also, the patient states she has in the past undergone previous Fosamax therapy - she is uncertain as to when or how long.  DUAL X-RAY ABSORPTIOMETRY (DXA) FOR BONE MINERAL DENSITY  AP LUMBAR SPINE L2, L3  Bone Mineral Density (BMD):            0.924 g/cm2 Young Adult T Score:                          -1.2 Z Score:                                                1.4  LEFT FEMUR NECK  Bone Mineral Density (BMD):             0.706 g/cm2 Young Adult T Score:                           -1.3 Z Score:                                                 0.9  ASSESSMENT:  Patient's diagnostic category is LOW BONE MASS by WHO Criteria.  FRACTURE RISK: INCREASED  FRAX: Based on the [place] model, the 10 year probability of a major osteoporotic fracture is 12%.  The 10 year probability of a hip fracture is 2.5%.  Comparison: Statistically significant worsening bone mineral density when compared with baseline.  RECOMMENDATIONS:  All patients should ensure an adequate intake of dietary calcium (1200mg  daily) and vitamin D (800 IU daily) unless contraindicated. The National Osteoporosis Foundation recommends that FDA-approved medical therapies be considered in postmenopausal women and mean age 36 or older with a:  1)    Hip or vertebral (clinical or morphometric) fracture.  2)   T-score of -2.5 or lower at the spine or hip.  3)   Ten-year fracture probability by FRAX of 3% or greater for hip fracture or 20% or greater for major osteoporotic fracture.  FOLLOW-UP:  People with diagnosed cases of osteoporosis or at high risk for fracture should have regular bone  mineral density tests.  For patients eligible for Medicare, routine testing is allowed once every 2 years.  The testing frequency can be increased to one year for patients who have rapidly progressing disease, those who are receiving or discontinuing medical therapy to  restore bone mass, or have additional risk factors.   Original Report Authenticated By: Altamese Cabal, M.D.   Mm Digital Screening  01/31/2013   CLINICAL DATA:  Screening.  EXAM: DIGITAL SCREENING BILATERAL MAMMOGRAM WITH CAD  DIGITAL BREAST TOMOSYNTHESIS  Digital breast tomosynthesis images are acquired in two projections. These images are reviewed in combination with the digital mammogram, confirming the findings below.  COMPARISON:  Previous exam(s).  ACR Breast Density Category b: There are scattered areas of fibroglandular density.  FINDINGS: There are no findings suspicious for malignancy. Images were processed with CAD.  IMPRESSION: No mammographic evidence of malignancy. A result letter of this screening mammogram will be mailed directly to the patient.  RECOMMENDATION: Screening mammogram in one year. (Code:[id number]-B-01Y)  BI-RADS CATEGORY  1: Negative   Electronically Signed   By: Curlene Dolphin M.D.   On: 01/31/2013 16:50       Assessment & Plan:   AWV/v70.0 - Today patient counseled on age appropriate routine health concerns for screening and prevention, each reviewed and up to date or declined. Immunizations reviewed and up to date or declined. Labs reviewed. Risk factors for depression reviewed and negative. Hearing function and visual acuity are intact. ADLs screened and addressed as needed. Functional ability and level of safety reviewed and appropriate. Education, counseling and referrals performed based on assessed risks today. Patient provided with a copy of personalized plan for preventive services.  Problem List Items Addressed This Visit   Aortic valve stenosis, severe     asymptomatic - Follows with cardiology for  same, echo and stress test July 2015 reviewed Patient wishes to meet with surgeon to explore surgical options for repair prior to becoming symptomatic -referral to cardiothoracic surgery done for same (requests [facility])    Relevant Orders      Ambulatory referral to Cardiothoracic Surgery      Lipid panel      TSH   HYPERCHOLESTEROLEMIA, BORDERLINE     Never on rx medication Excellent HDL ratio On OTC RYR for same Check annually and tx as needed    Osteopenia     Remote fosamax therapy - but poor absorbton so stopped same On Ca+D No fracture hx    Relevant Orders      TSH      Vit D  25 hydroxy (rtn osteoporosis monitoring)    Other Visit Diagnoses   Routine general medical examination at a health care facility    -  Primary    Relevant Orders       Lipid panel       TSH

## 2013-12-01 NOTE — Patient Instructions (Addendum)
It was good to see you today.  We have reviewed your prior records including labs and tests today  Health Maintenance reviewed - all recommended immunizations and age-appropriate screenings are up-to-date.  Test(s) ordered today. Your results will be released to [facility] (or called to you) after review, usually within 72hours after test completion. If any changes need to be made, you will be notified at that same time.  Medications reviewed and updated, no changes recommended at this time. Refill on medication(s) as discussed today.  we'll make referral to Dr Cyndia Bent for review of potential future surgical options. Our office will contact you regarding appointment(s) once made.  Please schedule followup in 12 months for annual exam and labs, call sooner if problems.  Aortic Stenosis Aortic stenosis is a narrowing of the aortic valve. The aortic valve is a gatelike structure that is located between the lower left chamber of the heart (left ventricle) and the blood vessel that leads away from the heart (aorta). When the aortic valve is narrowed, it does not open all the way. This makes it hard for the heart to pump blood into the aorta and causes the heart to work harder. The extra work can weaken the heart over time and lead to heart failure. CAUSES  Causes of aortic stenosis include:  Calcium deposits on the aortic valve that have made the valve stiff. This condition generally affects those over the age of 39. It is the most common cause of aortic stenosis.  A birth defect.  Rheumatic fever. This is a problem that may occur after a strep throat infection that was not treated adequately. Rheumatic fever can cause permanent damage to heart valves. SIGNS AND SYMPTOMS  People with aortic stenosis usually have no symptoms until the condition becomes severe. It may take 10-20 years for mild or moderate aortic stenosis to become severe. Symptoms may include:   Shortness of breath, especially with  physical activity.   Feeling weak and tired (fatigued) or getting tired easily.  Chest discomfort (angina). This may occur with minimal activity if the aortic stenosis is severe.  An irregular or faster-than-normal heartbeat.  Dizziness or fainting that happens with exertion or after taking certain heart medicines (such as nitroglycerin). DIAGNOSIS  Aortic stenosis is usually diagnosed with a physical exam and with a type of imaging test called echocardiography. During echocardiography, sound waves are used to evaluate how blood flows through the heart. If your health care provider suspects aortic stenosis but the test does not clearly show it, a procedure called cardiac catheterization may be done to diagnose the condition. Tests may also be done to evaluate heart function. They may include:  Electrocardiography. During this test, the electrical impulses of the heart are recorded while you are lying down and sticky patches are placed on your chest, arms, and legs.  Stress tests. There is more than one type of stress test. If a stress test is needed, ask your health care provider about which type is best for you.  Blood tests. TREATMENT  Treatment depends on how severe the aortic stenosis is, your symptoms, and the problems it is causing.   Observation. If the aortic stenosis is mild, no treatment may be needed. However, you will need to have the condition checked regularly to make sure it is not getting worse or causing serious problems.  Surgery. Surgery to repair or replace the aortic valve is the most common treatment for aortic stenosis. Several types of surgeries are available. The most common  are open-heart surgery and transcutaneous aortic valve replacement (TAVR). TAVR does not require that the chest be opened. It is usually performed on elderly patients and those who are not able to have open-heart surgery.  Medicines. Medicines may be given to keep symptoms from getting worse.  Medicines cannot reverse aortic stenosis. HOME CARE INSTRUCTIONS   You may need to avoid certain types of physical activity. If your aortic stenosis is mild, you may need to avoid only strenuous activity. The more severe your aortic stenosis, the more activities you will need to avoid. Talk with your health care provider about the types of activity you should avoid.  Take medicines only as directed by your health care provider.  If you are a woman with aortic valve stenosis and want to get pregnant, talk to your health care provider before you become pregnant.  If you are a woman with aortic valve stenosis and are pregnant, keep all follow-up visits with all recommended health care providers.  Keep all follow-up visits for tests, exams, and treatments as directed by your health care provider. SEEK IMMEDIATE MEDICAL CARE IF:  You develop chest pain or tightness.   You develop shortness of breath or difficulty breathing.   You develop light-headedness or faint.   It feels like your heartbeat is irregular or faster than normal.  You have a fever. Document Released: 01/11/2003 Document Revised: 08/29/2013 Document Reviewed: 04/08/2012 [facility] Patient Information 2015 [place], [place]. This information is not intended to replace advice given to you by your health care provider. Make sure you discuss any questions you have with your health care provider. Aortic Stenosis Aortic stenosis is a narrowing of the aortic valve. The aortic valve is a gatelike structure that is located between the lower left chamber of the heart (left ventricle) and the blood vessel that leads away from the heart (aorta). When the aortic valve is narrowed, it does not open all the way. This makes it hard for the heart to pump blood into the aorta and causes the heart to work harder. The extra work can weaken the heart over time and lead to heart failure. CAUSES  Causes of aortic stenosis include:  Calcium deposits  on the aortic valve that have made the valve stiff. This condition generally affects those over the age of 54. It is the most common cause of aortic stenosis.  A birth defect.  Rheumatic fever. This is a problem that may occur after a strep throat infection that was not treated adequately. Rheumatic fever can cause permanent damage to heart valves. SIGNS AND SYMPTOMS  People with aortic stenosis usually have no symptoms until the condition becomes severe. It may take 10-20 years for mild or moderate aortic stenosis to become severe. Symptoms may include:   Shortness of breath, especially with physical activity.   Feeling weak and tired (fatigued) or getting tired easily.  Chest discomfort (angina). This may occur with minimal activity if the aortic stenosis is severe.  An irregular or faster-than-normal heartbeat.  Dizziness or fainting that happens with exertion or after taking certain heart medicines (such as nitroglycerin). DIAGNOSIS  Aortic stenosis is usually diagnosed with a physical exam and with a type of imaging test called echocardiography. During echocardiography, sound waves are used to evaluate how blood flows through the heart. If your health care provider suspects aortic stenosis but the test does not clearly show it, a procedure called cardiac catheterization may be done to diagnose the condition. Tests may also be done to  evaluate heart function. They may include:  Electrocardiography. During this test, the electrical impulses of the heart are recorded while you are lying down and sticky patches are placed on your chest, arms, and legs.  Stress tests. There is more than one type of stress test. If a stress test is needed, ask your health care provider about which type is best for you.  Blood tests. TREATMENT  Treatment depends on how severe the aortic stenosis is, your symptoms, and the problems it is causing.   Observation. If the aortic stenosis is mild, no treatment  may be needed. However, you will need to have the condition checked regularly to make sure it is not getting worse or causing serious problems.  Surgery. Surgery to repair or replace the aortic valve is the most common treatment for aortic stenosis. Several types of surgeries are available. The most common are open-heart surgery and transcutaneous aortic valve replacement (TAVR). TAVR does not require that the chest be opened. It is usually performed on elderly patients and those who are not able to have open-heart surgery.  Medicines. Medicines may be given to keep symptoms from getting worse. Medicines cannot reverse aortic stenosis. HOME CARE INSTRUCTIONS   You may need to avoid certain types of physical activity. If your aortic stenosis is mild, you may need to avoid only strenuous activity. The more severe your aortic stenosis, the more activities you will need to avoid. Talk with your health care provider about the types of activity you should avoid.  Take medicines only as directed by your health care provider.  If you are a woman with aortic valve stenosis and want to get pregnant, talk to your health care provider before you become pregnant.  If you are a woman with aortic valve stenosis and are pregnant, keep all follow-up visits with all recommended health care providers.  Keep all follow-up visits for tests, exams, and treatments as directed by your health care provider. SEEK IMMEDIATE MEDICAL CARE IF:  You develop chest pain or tightness.   You develop shortness of breath or difficulty breathing.   You develop light-headedness or faint.   It feels like your heartbeat is irregular or faster than normal.  You have a fever. Document Released: 01/11/2003 Document Revised: 08/29/2013 Document Reviewed: 04/08/2012 [facility] Patient Information 2015 [place], [place]. This information is not intended to replace advice given to you by your health care provider. Make sure you discuss  any questions you have with your health care provider.

## 2013-12-01 NOTE — Assessment & Plan Note (Signed)
Never on rx medication Excellent HDL ratio On OTC RYR for same Check annually and tx as needed

## 2013-12-01 NOTE — Assessment & Plan Note (Signed)
Remote fosamax therapy - but poor absorbton so stopped same On Ca+D No fracture hx

## 2013-12-01 NOTE — Progress Notes (Signed)
Pre visit review using our clinic review tool, if applicable. No additional management support is needed unless otherwise documented below in the visit note. 

## 2013-12-01 NOTE — Assessment & Plan Note (Signed)
asymptomatic - Follows with cardiology for same, echo and stress test July 2015 reviewed Patient wishes to meet with surgeon to explore surgical options for repair prior to becoming symptomatic -referral to cardiothoracic surgery done for same (requests [facility])

## 2013-12-02 ENCOUNTER — Encounter: Payer: Self-pay | Admitting: Internal Medicine

## 2013-12-07 ENCOUNTER — Encounter: Payer: Self-pay | Admitting: Internal Medicine

## 2013-12-09 ENCOUNTER — Other Ambulatory Visit: Payer: Self-pay | Admitting: Obstetrics and Gynecology

## 2013-12-09 DIAGNOSIS — L259 Unspecified contact dermatitis, unspecified cause: Secondary | ICD-10-CM | POA: Diagnosis not present

## 2013-12-09 DIAGNOSIS — Z124 Encounter for screening for malignant neoplasm of cervix: Secondary | ICD-10-CM | POA: Diagnosis not present

## 2013-12-12 DIAGNOSIS — H348192 Central retinal vein occlusion, unspecified eye, stable: Secondary | ICD-10-CM | POA: Diagnosis not present

## 2013-12-12 DIAGNOSIS — H43819 Vitreous degeneration, unspecified eye: Secondary | ICD-10-CM | POA: Diagnosis not present

## 2013-12-12 LAB — CYTOLOGY - PAP

## 2013-12-13 ENCOUNTER — Encounter: Payer: Medicare Other | Admitting: Thoracic Surgery (Cardiothoracic Vascular Surgery)

## 2013-12-14 ENCOUNTER — Institutional Professional Consult (permissible substitution) (INDEPENDENT_AMBULATORY_CARE_PROVIDER_SITE_OTHER): Payer: Medicare Other | Admitting: Surgery

## 2013-12-14 ENCOUNTER — Encounter: Payer: Self-pay | Admitting: Surgery

## 2013-12-14 VITALS — BP 127/73 | HR 60 | Ht 65.5 in | Wt 155.0 lb

## 2013-12-14 DIAGNOSIS — I350 Nonrheumatic aortic (valve) stenosis: Secondary | ICD-10-CM

## 2013-12-14 DIAGNOSIS — I359 Nonrheumatic aortic valve disorder, unspecified: Secondary | ICD-10-CM | POA: Diagnosis not present

## 2013-12-15 ENCOUNTER — Encounter: Payer: Self-pay | Admitting: Surgery

## 2013-12-15 NOTE — Progress Notes (Signed)
PCP is Gwendolyn Grant, MD Referring Provider is Rowe Clack, MD  Chief Complaint  Patient presents with  . NEW CARDIAC    SEVER AORTIC VALV STENOSIS    HPI:  The patient is a 80 year old woman with a history of known severe aortic stenosis with suspected bicuspid aortic valve disease. He most recent echo on 09/26/2013 showed a mean gradient of 49 and a peak of 64 with an AVA of 0.54 by VTI and 0.61 by Vmax. Her aorta is normal sized. The mean gradient 1 year ago was 47 mm Hg. Left ventricular function remains normal. The patient has been asymptomatic. She spends the winters in [place] and remains active playing doubles tennis without any fatigue, shortness of breath, chest pain or dizziness. She did have one episode of near syncope after eating lunch one day with dizziness and nausea and noted to be pale and diaphoretic with bradycardia. She was hospitalized and observed and no abnormality was found. It was felt to be vagal. She has not had any further episodes. She was seen by Dr. Burt Knack in June and an exercise treadmill was planned but has not been done yet.   Past Medical History  Diagnosis Date  . Hypercholesteremia   . Osteopenia   . Ulcer 2012    bleeding gastric Ulcer  . Cancer     rt breast  . Aortic valve stenosis, severe     Past Surgical History  Procedure Laterality Date  . Breast surgery    . Tonsillectomy    . Parathyroidectomy    . Cosmetic surgery      face    Family History  Problem Relation Age of Onset  . Hypertension Mother   . Rectal cancer Mother   . Kidney disease Father     Social History History  Substance Use Topics  . Smoking status: Former Research scientist (life sciences)  . Smokeless tobacco: Never Used  . Alcohol Use: 4.2 oz/week    7 Glasses of wine per week    Current Outpatient Prescriptions  Medication Sig Dispense Refill  . CALCIUM CITRATE PO Take 500 mg by mouth daily.      . chloral hydrate 500 MG/5ML syrup Take 5 mLs (500 mg total) by  mouth at bedtime as needed for sleep.  900 mL  1  . Cholecalciferol (VITAMIN D-3) 1000 UNITS CAPS Take by mouth 1 day or 1 dose.      . Magnesium 400 MG CAPS Take by mouth daily.      . montelukast (SINGULAIR) 10 MG tablet       . Multiple Vitamins-Minerals (CENTRUM SILVER PO) Take by mouth.      . Red Yeast Rice Extract ([employer] RED YEAST RICE PO) Take 600 mg by mouth daily.       Marland Kitchen ALPRAZolam (XANAX) 0.5 MG tablet Take 1 tablet (0.5 mg total) by mouth at bedtime as needed for sleep.  30 tablet  3  . fluticasone (FLONASE) 50 MCG/ACT nasal spray       . hydrocortisone 2.5 % cream       . LORazepam (ATIVAN) 0.5 MG tablet Take 0.5 mg by mouth as needed for anxiety.       No current facility-administered medications for this visit.    Allergies  Allergen Reactions  . Amoxicillin-Pot Clavulanate     REACTION: hives  . Penicillins Hives  . Sulfonamide Derivatives     REACTION: hives    Review of Systems  Constitutional: Negative for activity change, appetite change, fatigue and unexpected weight change.  HENT: Negative.   Eyes: Negative.   Respiratory: Negative for cough, chest tightness and shortness of breath.   Cardiovascular: Negative for chest pain, palpitations and leg swelling.  Gastrointestinal: Negative.   Endocrine: Negative.   Genitourinary: Negative.   Musculoskeletal: Negative.   Skin: Negative.   Allergic/Immunologic: Negative.   Neurological: Negative.   Hematological: Negative.   Psychiatric/Behavioral: Negative.     BP 127/73  Pulse 60  Ht 5' 5.5" (1.664 m)  Wt 155 lb (70.308 kg)  BMI 25.39 kg/m2  SpO2 97% Physical Exam  Constitutional: She is oriented to person, place, and time. She appears well-developed and well-nourished. No distress.  HENT:  Head: Atraumatic.  Mouth/Throat: Oropharynx is clear and moist.  Eyes: EOM are normal. Pupils are equal, round, and reactive to light.  Neck: Normal range of motion. Neck supple. No JVD present. No thyromegaly  present.  Cardiovascular: Normal rate, regular rhythm and intact distal pulses.   Murmur heard. III/VI harsh crescendo/decrescendo murmur along RSB  Pulmonary/Chest: Effort normal and breath sounds normal. No respiratory distress.  Abdominal: Soft. Bowel sounds are normal. She exhibits no distension and no mass. There is no tenderness.  Musculoskeletal: She exhibits no edema.  Lymphadenopathy:    She has no cervical adenopathy.  Neurological: She is alert and oriented to person, place, and time. She has normal strength. No cranial nerve deficit or sensory deficit.  Skin: Skin is warm and dry.  Psychiatric: She has a normal mood and affect.     Diagnostic Tests:  [facility] [address], [place] [address] [phone]  ------------------------------------------------------------------- Transthoracic Echocardiography  Patient: [name], [name] MR #: [id number] Study Date: 09/26/2013 Gender: F Age: 88 Height: 165.1 cm Weight: 68 kg BSA: 1.78 m^2 Pt. Status: Room:  ATTENDING [name] Rouge, M.D. REFERRING [name], [place] SONOGRAPHER Cindy Hazy, RDCS PERFORMING [facility], Outpatient  cc:  -------------------------------------------------------------------  ------------------------------------------------------------------- Indications: 424.1 Aortic valve disorders.  ------------------------------------------------------------------- History: PMH: Acquired from the patient and from the patient&'s chart. PMH: Aortic Stenosis. MVP. History of Breast Cancer. Risk factors: Former tobacco use. Hypercholesterolemia.  ------------------------------------------------------------------- Study Conclusions  - Left ventricle: Severe basal septal hypertrophy. The cavity size was normal. Doppler parameters are consistent with abnormal left ventricular relaxation (grade 1 diastolic dysfunction). - Aortic valve: Bicuspid; severely  calcified leaflets. There was severe stenosis. - Mitral valve: Bileaflet prolapse. - Left atrium: The atrium was mildly dilated. - Atrial septum: No defect or patent foramen ovale was identified.  -------------------------------------------------------------------  ------------------------------------------------------------------- Left ventricle: Severe basal septal hypertrophy. The cavity size was normal. Doppler parameters are consistent with abnormal left ventricular relaxation (grade 1 diastolic dysfunction).  ------------------------------------------------------------------- Aortic valve: Bicuspid; severely calcified leaflets. Doppler: There was severe stenosis. There was no significant regurgitation. VTI ratio of LVOT to aortic valve: 0.17. Valve area (VTI): 0.54 cm^2. Indexed valve area (VTI): 0.3 cm^2/m^2. Valve area (Vmax): 0.61 cm^2. Indexed valve area (Vmax): 0.34 cm^2/m^2. Mean gradient (S): 49 mm Hg. Peak gradient (S): 64 mm Hg.  ------------------------------------------------------------------- Aorta: The aorta was normal, not dilated, and non-diseased.  ------------------------------------------------------------------- Mitral valve: Bileaflet prolapse. Doppler: There was trivial regurgitation.  ------------------------------------------------------------------- Left atrium: The atrium was mildly dilated.  ------------------------------------------------------------------- Atrial septum: No defect or patent foramen ovale was identified.  ------------------------------------------------------------------- Right ventricle: The cavity size was normal. Wall thickness was normal. Systolic function was normal.  ------------------------------------------------------------------- Pulmonic valve: Structurally normal valve. Cusp separation was normal.  Doppler: Transvalvular velocity was within the normal range. There was trivial  regurgitation.  ------------------------------------------------------------------- Tricuspid valve: Structurally normal valve. Leaflet separation was normal. Doppler: Transvalvular velocity was within the normal range. There was mild regurgitation.  ------------------------------------------------------------------- Right atrium: The atrium was normal in size.  ------------------------------------------------------------------- Pericardium: The pericardium was normal in appearance.  ------------------------------------------------------------------- Post procedure conclusions Ascending Aorta:  - The aorta was normal, not dilated, and non-diseased.  ------------------------------------------------------------------- Prepared and Electronically Authenticated by  [name] Rouge, M.D. 2015-06-01T12:10:08  ------------------------------------------------------------------- Measurements  Left ventricle Value 12/01/2012 Reference LV ID, ED, PLAX (L) 37.5 mm 35.8 43 - 52 chordal LV ID, ES, PLAX (L) 22.8 mm 21 23 - 38 chordal LV fx shortening, PLAX (N) 39 % 41 >=29 chordal LV PW thickness, ED 10.1 mm 8.08 --------- IVS/LV PW ratio, ED (H) 1.33 ---------- <=1.3 Stroke volume, 2D 62 ml ---------- --------- Stroke volume/bsa, 2D 35 ml/m^2 ---------- --------- LV e&', lateral 8.11 cm/s 6.91 --------- LV E/e&', lateral 8.52 11.78 --------- LV e&', medial 5.81 cm/s 4.28 --------- LV E/e&', medial 11.89 19.02 --------- LV e&', average 6.96 cm/s ---------- --------- LV E/e&', average 9.93 ---------- ---------  Ventricular septum Value 12/01/2012 Reference IVS thickness, ED 13.4 mm 17 ---------  LVOT Value 12/01/2012 Reference LVOT ID, S 20 mm 19 --------- LVOT area 3.14 cm^2 2.84 --------- LVOT VTI, S 19.7 cm 28.1 ---------  Aortic valve Value 12/01/2012 Reference Aortic valve peak 401 cm/s 426 --------- velocity, S Aortic valve mean 333 cm/s 330 --------- velocity, S Aortic  valve VTI, S 115 cm 117 --------- Aortic mean gradient, 49 mm Hg 47 --------- S Aortic peak gradient, 64 mm Hg 73 --------- S VTI ratio, LVOT/AV 0.17 0.24 --------- Aortic valve area, VTI 0.54 cm^2 0.68 --------- Aortic valve area/bsa, 0.3 cm^2/m^2 0.38 --------- VTI Aortic valve area, 0.61 cm^2 0.67 --------- peak velocity Aortic valve area/bsa, 0.34 cm^2/m^2 0.37 --------- peak velocity  Aorta Value 12/01/2012 Reference Aortic root ID, ED 32 mm 33 ---------  Left atrium Value 12/01/2012 Reference LA ID, A-P, ES 40 mm 41 --------- LA ID/bsa, A-P (H) 2.25 cm/m^2 2.3 <=2.2  Mitral valve Value 12/01/2012 Reference Mitral E-wave peak 69.1 cm/s 81.4 --------- velocity Mitral A-wave peak 97.2 cm/s 89.8 --------- velocity Mitral deceleration (H) 282 ms 306 150 - 230 time Mitral E/A ratio, peak 0.7 0.9 ---------  Tricuspid valve Value 12/01/2012 Reference Tricuspid regurg peak 267 cm/s 258 --------- velocity Tricuspid peak RV-RA 29 mm Hg 27 --------- gradient Tricuspid maximal 267 cm/s ---------- --------- regurg velocity, PISA  Right ventricle Value 12/01/2012 Reference RV s&', lateral, S 16.4 cm/s 10.6 ---------  Legend: (L) and (H) mark values outside specified reference range.  (N) marks values inside specified reference range.   Impression:  She has asymptomatic severe aortic stenosis with a probable bicuspid valve. She is interested in TAVR but is not a candidate at this time due to her low operative risk for conventional surgical AVR. I discussed the currently accepted criteria for TAVR including extreme operative risk of mortality > 8% by the STS risk calculator. I estimated that her risk is only 2-3%. In addition she has a bicuspid aortic valve and TAVR is not currently recommended for bicuspid valves, although it can be done in selected cases depending on the anatomy of the valve. We discussed the possibility that TAVR may become an option for intermediate risk  patients in the future pending review of current study data by the FDA but the timing of that option if approved is unclear.  It could be 12-24 months or more. She is not interested in having open surgical AVR unless she starts having symptoms and wants to continue close follow up, hoping that she will be a TAVR candidate at some point. I discussed the symptoms of severe AS with her and her husband and I think they are very reliable and will be in tune to those. She would like to continue physical activity including playing doubles tennis and I think she can as long as her activity is moderate and causes no symptoms. She understands the risk of sudden decompensation.  Plan:  She is going to follow up with Dr. Burt Knack. I will be happy to reevaluate her as the need arises.

## 2013-12-29 ENCOUNTER — Telehealth: Payer: Self-pay | Admitting: Internal Medicine

## 2013-12-29 MED ORDER — ALPRAZOLAM 0.5 MG PO TABS: 0.5000 mg | ORAL_TABLET | Freq: Every evening | ORAL | Status: DC | PRN

## 2013-12-29 NOTE — Telephone Encounter (Signed)
Okay to refill x 6 months or 1 time rx?

## 2013-12-29 NOTE — Telephone Encounter (Signed)
Ok to refill 

## 2013-12-29 NOTE — Telephone Encounter (Signed)
Rx for alprazolam x 6 months sent to [facility].

## 2013-12-29 NOTE — Telephone Encounter (Signed)
6 mon

## 2013-12-29 NOTE — Telephone Encounter (Signed)
Dr Hilarie Fredrickson, Patient requests 6 months alprazolam to be sent to pharmacy. Patient has not been seen in office before. She had endo in 08/2011. From what I can tell in the history portion of the chart, patient was intially given alprazolam on 07/09/11 and was supposed to have 0 refills. However, we have been providing this medication for 2 years. Do you want me to okay 1 more refill and have her get further refills from PCP or keep refilling?

## 2014-01-18 ENCOUNTER — Encounter: Payer: Medicare Other | Admitting: Internal Medicine

## 2014-01-26 ENCOUNTER — Ambulatory Visit (INDEPENDENT_AMBULATORY_CARE_PROVIDER_SITE_OTHER): Payer: Medicare Other

## 2014-01-26 DIAGNOSIS — Z23 Encounter for immunization: Secondary | ICD-10-CM | POA: Diagnosis not present

## 2014-01-27 ENCOUNTER — Ambulatory Visit: Payer: Medicare Other

## 2014-02-01 ENCOUNTER — Ambulatory Visit: Payer: Medicare Other

## 2014-02-01 DIAGNOSIS — D3141 Benign neoplasm of right ciliary body: Secondary | ICD-10-CM | POA: Diagnosis not present

## 2014-02-01 DIAGNOSIS — H34831 Tributary (branch) retinal vein occlusion, right eye: Secondary | ICD-10-CM | POA: Diagnosis not present

## 2014-02-01 DIAGNOSIS — H34811 Central retinal vein occlusion, right eye: Secondary | ICD-10-CM | POA: Diagnosis not present

## 2014-02-01 DIAGNOSIS — Z87891 Personal history of nicotine dependence: Secondary | ICD-10-CM | POA: Diagnosis not present

## 2014-02-09 ENCOUNTER — Ambulatory Visit: Payer: Medicare Other

## 2014-02-09 ENCOUNTER — Ambulatory Visit
Admission: RE | Admit: 2014-02-09 | Discharge: 2014-02-09 | Disposition: A | Discharge status: Home / Self Care | Payer: Medicare Other | Source: Ambulatory Visit | Attending: Adult Health | Admitting: Adult Health

## 2014-02-09 DIAGNOSIS — Z1231 Encounter for screening mammogram for malignant neoplasm of breast: Secondary | ICD-10-CM | POA: Diagnosis not present

## 2014-02-09 DIAGNOSIS — C50911 Malignant neoplasm of unspecified site of right female breast: Secondary | ICD-10-CM

## 2014-02-27 DIAGNOSIS — M810 Age-related osteoporosis without current pathological fracture: Secondary | ICD-10-CM | POA: Diagnosis not present

## 2014-02-27 DIAGNOSIS — C50411 Malignant neoplasm of upper-outer quadrant of right female breast: Secondary | ICD-10-CM | POA: Diagnosis not present

## 2014-03-01 DIAGNOSIS — H34833 Tributary (branch) retinal vein occlusion, bilateral: Secondary | ICD-10-CM | POA: Diagnosis not present

## 2014-03-01 DIAGNOSIS — H01022 Squamous blepharitis right lower eyelid: Secondary | ICD-10-CM | POA: Diagnosis not present

## 2014-03-01 DIAGNOSIS — H25813 Combined forms of age-related cataract, bilateral: Secondary | ICD-10-CM | POA: Diagnosis not present

## 2014-03-01 DIAGNOSIS — H04123 Dry eye syndrome of bilateral lacrimal glands: Secondary | ICD-10-CM | POA: Diagnosis not present

## 2014-03-01 DIAGNOSIS — H01024 Squamous blepharitis left upper eyelid: Secondary | ICD-10-CM | POA: Diagnosis not present

## 2014-03-01 DIAGNOSIS — H01021 Squamous blepharitis right upper eyelid: Secondary | ICD-10-CM | POA: Diagnosis not present

## 2014-03-01 DIAGNOSIS — H01025 Squamous blepharitis left lower eyelid: Secondary | ICD-10-CM | POA: Diagnosis not present

## 2014-03-08 DIAGNOSIS — I359 Nonrheumatic aortic valve disorder, unspecified: Secondary | ICD-10-CM | POA: Diagnosis not present

## 2014-03-08 DIAGNOSIS — I10 Essential (primary) hypertension: Secondary | ICD-10-CM | POA: Diagnosis not present

## 2014-03-31 ENCOUNTER — Other Ambulatory Visit: Payer: Self-pay

## 2014-03-31 ENCOUNTER — Telehealth: Payer: Self-pay

## 2014-03-31 DIAGNOSIS — L821 Other seborrheic keratosis: Secondary | ICD-10-CM | POA: Diagnosis not present

## 2014-03-31 DIAGNOSIS — L259 Unspecified contact dermatitis, unspecified cause: Secondary | ICD-10-CM | POA: Diagnosis not present

## 2014-03-31 MED ORDER — CHLORAL HYDRATE 500 MG/5ML PO SYRP: 1000.0000 mg | ORAL_SOLUTION | Freq: Every evening | ORAL | Status: DC | PRN

## 2014-03-31 NOTE — Telephone Encounter (Signed)
Pt called about rx for chloral hydrate and stated that the chloral hydrate was refilled but with the wrong sig on it. I phoned in the correct refill and sig, after checking all of the OV notes, to the [facility] pharmacy.   Original Sig was for 39ml qhs prn sleep; 07/2013 sig stated 83ml qhs prn sleep; called in today 89ml qhs prn sleep. Indicated on med list. OV notes around the time of change of rx did not note that a change was suppose to occur.

## 2014-04-01 NOTE — Progress Notes (Signed)
Noted change in dispense to match original prescription. Agree with same so long as pharmacy approval continues to cover this BEERS (higher risk in elderly) med as patient has chronically taken same

## 2014-04-24 DIAGNOSIS — J309 Allergic rhinitis, unspecified: Secondary | ICD-10-CM | POA: Diagnosis not present

## 2014-04-24 DIAGNOSIS — Q231 Congenital insufficiency of aortic valve: Secondary | ICD-10-CM | POA: Diagnosis not present

## 2014-04-24 DIAGNOSIS — Z0000 Encounter for general adult medical examination without abnormal findings: Secondary | ICD-10-CM | POA: Diagnosis not present

## 2014-04-24 DIAGNOSIS — K5900 Constipation, unspecified: Secondary | ICD-10-CM | POA: Diagnosis not present

## 2014-04-24 DIAGNOSIS — Z853 Personal history of malignant neoplasm of breast: Secondary | ICD-10-CM | POA: Diagnosis not present

## 2014-05-03 DIAGNOSIS — Z0000 Encounter for general adult medical examination without abnormal findings: Secondary | ICD-10-CM | POA: Diagnosis not present

## 2014-05-03 DIAGNOSIS — Z23 Encounter for immunization: Secondary | ICD-10-CM | POA: Diagnosis not present

## 2014-05-18 DIAGNOSIS — H35373 Puckering of macula, bilateral: Secondary | ICD-10-CM | POA: Diagnosis not present

## 2014-05-18 DIAGNOSIS — H34831 Tributary (branch) retinal vein occlusion, right eye: Secondary | ICD-10-CM | POA: Diagnosis not present

## 2014-06-06 ENCOUNTER — Telehealth: Payer: Self-pay | Admitting: Internal Medicine

## 2014-06-06 MED ORDER — CHLORAL HYDRATE 500 MG/5ML PO SYRP: 1000.0000 mg | ORAL_SOLUTION | Freq: Every evening | ORAL | Status: DC | PRN

## 2014-06-06 NOTE — Telephone Encounter (Signed)
rx called to pharmacy 

## 2014-06-06 NOTE — Telephone Encounter (Signed)
Pt called in requesting refill on her chloral hydrate 500 MG/5ML syrup [[id number]] .  She wants the total MLS of 900  She is requesting it to have 1 refill.  [phone] fax number  [phone] pharmacy number

## 2014-06-08 ENCOUNTER — Telehealth: Payer: Self-pay | Admitting: Internal Medicine

## 2014-06-09 ENCOUNTER — Telehealth: Payer: Self-pay | Admitting: *Deleted

## 2014-06-09 NOTE — Telephone Encounter (Signed)
error 

## 2014-06-09 NOTE — Telephone Encounter (Signed)
Dr Hilarie Fredrickson, see last telephone note from 12/29/13. Patient states that she knows she cannot get alprazolam refills until 06/29/14 but wanted to give [place] a "heads up." She has scheduled an appointment with you for 09/11/14 when she will be back in [place] since she has only been seen for EGD in the past (3 years ago). She states that she really needs refills on alprazolam to get her through until her office visit in May because this is what helps her to sleep. Please advise.Marland KitchenMarland Kitchen

## 2014-06-09 NOTE — Telephone Encounter (Signed)
Left message advising pt that we are unable to refill medications until 06/29/14. However she would need an office visit for that as well since we have not seen her in around 3 years. She may be able to have [facility] to transfer her prescription to [facility] as an option.

## 2014-06-12 MED ORDER — ALPRAZOLAM 0.5 MG PO TABS: 0.5000 mg | ORAL_TABLET | Freq: Every evening | ORAL | Status: DC | PRN

## 2014-06-12 NOTE — Telephone Encounter (Signed)
Ok to refill until follow-up 

## 2014-06-12 NOTE — Telephone Encounter (Signed)
Rx sent 

## 2014-06-12 NOTE — Addendum Note (Signed)
Addended by: Larina Bras on: 06/12/2014 12:12 PM   Modules accepted: Orders

## 2014-06-12 NOTE — Telephone Encounter (Signed)
Patient advised.

## 2014-06-13 DIAGNOSIS — Q231 Congenital insufficiency of aortic valve: Secondary | ICD-10-CM | POA: Diagnosis not present

## 2014-06-13 DIAGNOSIS — J309 Allergic rhinitis, unspecified: Secondary | ICD-10-CM | POA: Diagnosis not present

## 2014-06-13 DIAGNOSIS — R002 Palpitations: Secondary | ICD-10-CM | POA: Diagnosis not present

## 2014-06-13 DIAGNOSIS — F411 Generalized anxiety disorder: Secondary | ICD-10-CM | POA: Diagnosis not present

## 2014-06-26 ENCOUNTER — Telehealth: Payer: Self-pay | Admitting: Cardiovascular Disease

## 2014-06-26 DIAGNOSIS — I350 Nonrheumatic aortic (valve) stenosis: Secondary | ICD-10-CM

## 2014-06-26 NOTE — Telephone Encounter (Signed)
I reviewed the pt's chart and she is due for a office visit and repeat Echocardiogram in June 2016. The pt had a GXT performed 11/04/13. I will forward this message to Dr Burt Knack to review and determine if the pt needs a repeat GXT.

## 2014-06-26 NOTE — Telephone Encounter (Signed)
New Msg        Pt calling states she would like to have an echo and a stress test.   No orders in the system for these test, pt states she has it completed each year.  Please return pt call.

## 2014-06-26 NOTE — Telephone Encounter (Signed)
Let's do the June echo and office visit then we will decide on stress test. thx

## 2014-06-27 NOTE — Telephone Encounter (Signed)
I spoke with the pt and arranged appointments in June.

## 2014-06-30 DIAGNOSIS — F411 Generalized anxiety disorder: Secondary | ICD-10-CM | POA: Diagnosis not present

## 2014-06-30 DIAGNOSIS — J309 Allergic rhinitis, unspecified: Secondary | ICD-10-CM | POA: Diagnosis not present

## 2014-07-17 ENCOUNTER — Encounter: Payer: Self-pay | Admitting: Internal Medicine

## 2014-07-20 DIAGNOSIS — L723 Sebaceous cyst: Secondary | ICD-10-CM | POA: Diagnosis not present

## 2014-08-07 ENCOUNTER — Telehealth: Payer: Self-pay | Admitting: Hematology and Oncology

## 2014-08-07 NOTE — Telephone Encounter (Signed)
Called patient and left message with new appointment due to dr Lindi Adie on call day

## 2014-08-14 ENCOUNTER — Encounter: Payer: Self-pay | Admitting: *Deleted

## 2014-08-17 DIAGNOSIS — R040 Epistaxis: Secondary | ICD-10-CM | POA: Diagnosis not present

## 2014-08-17 DIAGNOSIS — Z87891 Personal history of nicotine dependence: Secondary | ICD-10-CM | POA: Diagnosis not present

## 2014-08-21 DIAGNOSIS — R040 Epistaxis: Secondary | ICD-10-CM | POA: Diagnosis not present

## 2014-08-21 DIAGNOSIS — H8110 Benign paroxysmal vertigo, unspecified ear: Secondary | ICD-10-CM | POA: Diagnosis not present

## 2014-08-21 DIAGNOSIS — J342 Deviated nasal septum: Secondary | ICD-10-CM | POA: Diagnosis not present

## 2014-08-23 DIAGNOSIS — R9431 Abnormal electrocardiogram [ECG] [EKG]: Secondary | ICD-10-CM | POA: Diagnosis not present

## 2014-08-23 DIAGNOSIS — I359 Nonrheumatic aortic valve disorder, unspecified: Secondary | ICD-10-CM | POA: Diagnosis not present

## 2014-08-28 ENCOUNTER — Telehealth: Payer: Self-pay | Admitting: Internal Medicine

## 2014-08-28 NOTE — Telephone Encounter (Signed)
MD out of office pls advise.../lmb 

## 2014-08-28 NOTE — Telephone Encounter (Signed)
Called pt no answer LMOM with md response.../lmb 

## 2014-08-28 NOTE — Telephone Encounter (Signed)
Pt called in said that she is coming back from [facility] and needs a refill on her chloral hydrate 500 MG/5ML syrup [[id number]] .   She would like it sent to [facility]  number [phone]

## 2014-08-28 NOTE — Telephone Encounter (Signed)
She has not been seen recently enough to receive a narcotic prescription

## 2014-09-04 ENCOUNTER — Telehealth: Payer: Self-pay | Admitting: Internal Medicine

## 2014-09-04 DIAGNOSIS — H8110 Benign paroxysmal vertigo, unspecified ear: Secondary | ICD-10-CM | POA: Diagnosis not present

## 2014-09-04 DIAGNOSIS — J310 Chronic rhinitis: Secondary | ICD-10-CM | POA: Diagnosis not present

## 2014-09-04 NOTE — Telephone Encounter (Signed)
LVM for pt to call back as soon as possible.   

## 2014-09-04 NOTE — Telephone Encounter (Signed)
Patient is asking for you to give her a call regarding chloral hydrate 500 MG/5ML syrup [[id number]] refill. She states that she needs to speak directly with you about this and refused my assistance.

## 2014-09-05 DIAGNOSIS — M810 Age-related osteoporosis without current pathological fracture: Secondary | ICD-10-CM | POA: Diagnosis not present

## 2014-09-05 DIAGNOSIS — Z170 Estrogen receptor positive status [ER+]: Secondary | ICD-10-CM | POA: Diagnosis not present

## 2014-09-05 DIAGNOSIS — C50411 Malignant neoplasm of upper-outer quadrant of right female breast: Secondary | ICD-10-CM | POA: Diagnosis not present

## 2014-09-06 ENCOUNTER — Other Ambulatory Visit: Payer: Self-pay

## 2014-09-06 ENCOUNTER — Telehealth: Payer: Self-pay

## 2014-09-06 NOTE — Telephone Encounter (Signed)
Please call patient asap on her cell phone.

## 2014-09-06 NOTE — Telephone Encounter (Signed)
error 

## 2014-09-11 ENCOUNTER — Ambulatory Visit (INDEPENDENT_AMBULATORY_CARE_PROVIDER_SITE_OTHER): Payer: Medicare Other | Admitting: Internal Medicine

## 2014-09-11 ENCOUNTER — Telehealth: Payer: Self-pay | Admitting: Internal Medicine

## 2014-09-11 ENCOUNTER — Encounter: Payer: Self-pay | Admitting: Internal Medicine

## 2014-09-11 VITALS — BP 120/80 | HR 64 | Ht 64.25 in | Wt 157.1 lb

## 2014-09-11 DIAGNOSIS — K5900 Constipation, unspecified: Secondary | ICD-10-CM | POA: Diagnosis not present

## 2014-09-11 DIAGNOSIS — Z8719 Personal history of other diseases of the digestive system: Secondary | ICD-10-CM | POA: Diagnosis not present

## 2014-09-11 DIAGNOSIS — Z1211 Encounter for screening for malignant neoplasm of colon: Secondary | ICD-10-CM

## 2014-09-11 DIAGNOSIS — K589 Irritable bowel syndrome without diarrhea: Secondary | ICD-10-CM | POA: Diagnosis not present

## 2014-09-11 DIAGNOSIS — Z8711 Personal history of peptic ulcer disease: Secondary | ICD-10-CM

## 2014-09-11 MED ORDER — ALPRAZOLAM 0.5 MG PO TABS: 0.5000 mg | ORAL_TABLET | Freq: Every day | ORAL | Status: DC

## 2014-09-11 NOTE — Progress Notes (Signed)
Subjective:    Patient ID: [name], female    DOB: 02/14/1933, 81 y.o.   MRN: [id number]  HPI [name] is a 81 year old female with a past medical history of gastric ulcer, IBS, severe aortic stenosis, history of breast cancer who seen in follow-up. She is here alone today. She reports that she is doing quite well. She is just returned from [facility] where she spends the went for months. She does continue to have very intermittent abdominal discomfort associated with stressful situations. She reports this is very predictable and stable for her. She uses deep breathing as a calming strategy in this almost always helps with her abdominal discomfort and stress. She is using Xanax 0.5 mg at bedtime to help with sleep. Is a stable dose for her. She reports she is eating well with no nausea or vomiting. No early satiety. She is not having heartburn and is no longer taking acid suppression medication. Bowel movements at times can become constipated and so she is using Benefiber several days per week. No blood in her stool or melena. She does report stressors are her husband particularly with his memory loss. On the whole she is feeling well and denies chest pain or dyspnea. She has cardiology follow-up with Dr. Burt Knack next month for ongoing evaluation of her aortic stenosis and possible consideration of TAVR.  Her last colonoscopy was normal, performed in 2009 by Dr. Henrene Pastor. This was reviewed today by me and with the patient  Review of Systems As per history of present illness, otherwise negative  Current Medications, Allergies, Past Medical History, Past Surgical History, Family History and Social History were reviewed in [employer] record.     Objective:   Physical Exam BP 120/80 mmHg  Pulse 64  Ht 5' 4.25" (1.632 m)  Wt 157 lb 2 oz (71.271 kg)  BMI 26.76 kg/m2 Constitutional: Well-developed and well-nourished. No distress. HEENT:  Normocephalic and atraumatic. Oropharynx is clear and moist. No oropharyngeal exudate. Conjunctivae are normal.  No scleral icterus. Neck: Neck supple. Trachea midline. Cardiovascular: Normal rate, regular rhythm and intact distal pulses. 3/6 SEM Pulmonary/chest: Effort normal and breath sounds normal. No wheezing, rales or rhonchi. Abdominal: Soft, nontender, nondistended. Bowel sounds active throughout.  Extremities: no clubbing, cyanosis, or edema Neurological: Alert and oriented to person place and time. Skin: Skin is warm and dry. No rashes noted. Psychiatric: Normal mood and affect. Behavior is normal.  EGD and colonoscopy reviewed     Assessment & Plan:   81 year old female with a past medical history of gastric ulcer, IBS, severe aortic stenosis, history of breast cancer who seen in follow-up.  1. IBS -- long-standing and predictable abdominal discomfort driven by stress and anxiety. No alarm symptoms. I advised that she continue to use her deep breathing strategy to help calm down and periods of stress which exacerbates her irritable bowel. She is aware of the correlation with her irritable bowel and seems to manage this well. She can use a rare additional dose of Xanax 0.5 mg in the daytime if necessary for anxiety and irritable bowel symptoms. I would like this to be infrequent. She understands this recommendation. He will continue Xanax 0.5 mg daily at bedtime  2. Constipation -- mild without alarm symptoms. I asked that she increase Benefiber to 1 tablespoon daily. It has worked well for her in the past.  3. CRC screening -- last colonoscopy 2009, no polyps. Repeat would be recommended in 2019 but  she will be 81 years old. We discussed how screening generally stops around age 74, she is in full agreement with no further screening colonoscopies.  4. History of gastric ulcers -- no symptoms currently, avoid NSAIDs when possible  Return in 1 yr, sooner if needed 25 min spent with  pt today

## 2014-09-11 NOTE — Telephone Encounter (Signed)
Advised that rx for xanax is okay to fill.

## 2014-09-11 NOTE — Patient Instructions (Signed)
We have sent the following medications to your pharmacy for you to pick up at your convenience: Xanax 0.5 mg every night (may take additional dose as needed for stress, abdominal pain related to IBS)  Please follow up with Dr Hilarie Fredrickson in year.

## 2014-09-13 ENCOUNTER — Ambulatory Visit: Payer: BLUE CROSS/BLUE SHIELD | Admitting: Internal Medicine

## 2014-09-20 DIAGNOSIS — H34831 Tributary (branch) retinal vein occlusion, right eye: Secondary | ICD-10-CM | POA: Diagnosis not present

## 2014-09-20 DIAGNOSIS — D3141 Benign neoplasm of right ciliary body: Secondary | ICD-10-CM | POA: Diagnosis not present

## 2014-09-28 ENCOUNTER — Ambulatory Visit (INDEPENDENT_AMBULATORY_CARE_PROVIDER_SITE_OTHER): Payer: Medicare Other | Admitting: Internal Medicine

## 2014-09-28 ENCOUNTER — Encounter: Payer: Self-pay | Admitting: Internal Medicine

## 2014-09-28 VITALS — BP 118/80 | HR 65 | Temp 98.1°F | Resp 15 | Wt 156.0 lb

## 2014-09-28 DIAGNOSIS — J209 Acute bronchitis, unspecified: Secondary | ICD-10-CM

## 2014-09-28 DIAGNOSIS — J069 Acute upper respiratory infection, unspecified: Secondary | ICD-10-CM | POA: Diagnosis not present

## 2014-09-28 MED ORDER — HYDROCODONE-HOMATROPINE 5-1.5 MG/5ML PO SYRP: 5.0000 mL | ORAL_SOLUTION | Freq: Three times a day (TID) | ORAL | Status: DC | PRN

## 2014-09-28 MED ORDER — DOXYCYCLINE HYCLATE 100 MG PO TABS: 100.0000 mg | ORAL_TABLET | Freq: Two times a day (BID) | ORAL | Status: DC

## 2014-09-28 NOTE — Progress Notes (Signed)
   Subjective:    Patient ID: [name], female    DOB: [birth year], 82 y.o.   MRN: [id number]  HPI She has had a cough for 4 days; initially it was nonproductive for 2 days but now was turned "moist". She is unable to  Expectorate sputum. The cough is disturbing sleep. She has had some sneezing and rhinitis. She has major symptoms of rhinosinusitis of frontal headache and congestion. She has some otic pain as well as dental pain. She has had some hoarseness as well as sore throat.  Delsym was of minimal benefit. She continued Singulair.  She has a past history of 20-pack-years of smoking. She is allergic to penicillin and sulfa.   Review of Systems She denies nasal purulence, facial pain, otic discharge, fever, chills, or sweats. The cough is not associated with wheezing or shortness of breath.    Objective:   Physical Exam  Pertinent or positive findings include: She has some hyponasal speech pattern. Nares are dry and erythematous.  She has bilateral ptosis.  She has a musical grade 4-9.4 systolic murmur.  General appearance:Adequately nourished; no acute distress or increased work of breathing is present.   Lymphatic: No  lymphadenopathy about the head, neck, or axilla .Eyes: No conjunctival inflammation or lid edema is present. There is no scleral icterus. Ears:  External ear exam shows no significant lesions or deformities.  Otoscopic examination reveals clear canals, tympanic membranes are intact bilaterally without bulging, retraction, inflammation or discharge. Nose:  External nasal examination shows no deformity or inflammation. No septal dislocation or deviation.No obstruction to airflow.  Oral exam: Dental hygiene is good; lips and gums are healthy appearing.There is no oropharyngeal erythema or exudate  Neck:  No deformities, thyromegaly, masses, or tenderness noted.   Supple with full range of motion without pain.  Heart:  Normal rate and regular rhythm. S1 and S2  normal without gallop, click, rub or other extra sounds.  Lungs:Chest clear to auscultation; no wheezes, rhonchi,rales ,or rubs present. Extremities:  No cyanosis, edema, or clubbing  noted  Skin: Warm & dry w/o tenting or jaundice. No significant lesions or rash.      Assessment & Plan:  #1 acute bronchitis  #2 upper respiratory tract infection  Plan: See orders recommendations

## 2014-09-28 NOTE — Patient Instructions (Signed)

## 2014-09-28 NOTE — Progress Notes (Signed)
Pre visit review using our clinic review tool, if applicable. No additional management support is needed unless otherwise documented below in the visit note. 

## 2014-10-10 ENCOUNTER — Ambulatory Visit (INDEPENDENT_AMBULATORY_CARE_PROVIDER_SITE_OTHER): Payer: Medicare Other | Admitting: Cardiovascular Disease

## 2014-10-10 ENCOUNTER — Other Ambulatory Visit (HOSPITAL_COMMUNITY): Payer: BLUE CROSS/BLUE SHIELD

## 2014-10-10 ENCOUNTER — Ambulatory Visit (HOSPITAL_COMMUNITY): Payer: Medicare Other | Attending: Cardiovascular Disease

## 2014-10-10 ENCOUNTER — Other Ambulatory Visit: Payer: Self-pay

## 2014-10-10 ENCOUNTER — Encounter: Payer: Self-pay | Admitting: Cardiovascular Disease

## 2014-10-10 VITALS — BP 102/70 | HR 61 | Ht 64.0 in | Wt 156.4 lb

## 2014-10-10 DIAGNOSIS — I350 Nonrheumatic aortic (valve) stenosis: Secondary | ICD-10-CM

## 2014-10-10 DIAGNOSIS — E780 Pure hypercholesterolemia: Secondary | ICD-10-CM | POA: Insufficient documentation

## 2014-10-10 DIAGNOSIS — Z87891 Personal history of nicotine dependence: Secondary | ICD-10-CM | POA: Insufficient documentation

## 2014-10-10 LAB — CBC
HEMATOCRIT: 43.7 % (ref 36.0–46.0)
Hemoglobin: 14.7 g/dL (ref 12.0–15.0)
MCHC: 33.5 g/dL (ref 30.0–36.0)
MCV: 94.3 fl (ref 78.0–100.0)
Platelets: 205 10*3/uL (ref 150.0–400.0)
RBC: 4.64 Mil/uL (ref 3.87–5.11)
RDW: 13.7 % (ref 11.5–15.5)
WBC: 7.8 10*3/uL (ref 4.0–10.5)

## 2014-10-10 LAB — PROTIME-INR
INR: 1 ratio (ref 0.8–1.0)
Prothrombin Time: 11.4 s (ref 9.6–13.1)

## 2014-10-10 LAB — BASIC METABOLIC PANEL
BUN: 15 mg/dL (ref 6–23)
CALCIUM: 9.5 mg/dL (ref 8.4–10.5)
CO2: 29 mEq/L (ref 19–32)
Chloride: 97 mEq/L (ref 96–112)
Creatinine, Ser: 0.71 mg/dL (ref 0.40–1.20)
GFR: 84.26 mL/min (ref >60.00)
Glucose, Bld: 87 mg/dL (ref 70–99)
Potassium: 4 mEq/L (ref 3.5–5.1)
Sodium: 132 mEq/L — ABNORMAL LOW (ref 135–145)

## 2014-10-10 NOTE — Progress Notes (Signed)
Cardiology Office Note   Date:  10/10/2014   ID:  [name], DOB 1932-07-24, MRN [id number]  PCP:  Gwendolyn Grant, MD  Cardiologist:  [name], MD    No chief complaint on file.    History of Present Illness: [name] is a 81 y.o. female who presents for follow-up of aortic stenosis. The patient is followed for severe aortic stenosis. She is thought to have a bicuspid aortic valve. She also has bileaflet mitral valve prolapse with mild mitral regurgitation.  She remains physically active with regular walking for exercise. She's not playing as much tennis as she has in the past and now primarily on plays doubles tennis when she is in [place]. She denies chest pain, shortness of breath, or syncope. Overall feels like she's doing ok.    Past Medical History  Diagnosis Date  . Hypercholesteremia   . Osteopenia   . Ulcer 2012    bleeding gastric Ulcer  . Breast cancer     right  . Aortic valve stenosis, severe   . Hiatal hernia     Past Surgical History  Procedure Laterality Date  . Breast surgery    . Tonsillectomy    . Parathyroidectomy    . Cosmetic surgery      face    Current Outpatient Prescriptions  Medication Sig Dispense Refill  . ALPRAZolam (XANAX) 0.5 MG tablet Take 1 tablet (0.5 mg total) by mouth at bedtime. May take additional dose as needed for stress or abdominal pain related to IBS. 120 tablet 2  . CALCIUM CITRATE PO Take 500 mg by mouth daily.    . Cholecalciferol (VITAMIN D-3) 1000 UNITS CAPS Take by mouth 1 day or 1 dose.    . fluticasone (FLONASE) 50 MCG/ACT nasal spray as needed.     Marland Kitchen HYDROcodone-homatropine (HYCODAN) 5-1.5 MG/5ML syrup Take 5 mLs by mouth every 8 (eight) hours as needed for cough. 120 mL 0  . LORazepam (ATIVAN) 0.5 MG tablet Take 0.5 mg by mouth as needed for anxiety.    . Magnesium 400 MG CAPS Take by mouth daily.    . Red Yeast Rice Extract ([employer] RED YEAST RICE PO) Take 600 mg by mouth daily.      No  current facility-administered medications for this visit.    Allergies:   Amoxicillin-pot clavulanate; Penicillins; and Sulfonamide derivatives   Social History:  The patient  reports that she has quit smoking. She has never used smokeless tobacco. She reports that she drinks about 4.2 oz of alcohol per week. She reports that she does not use illicit drugs.   Family History:  The patient's  family history includes Hypertension in her mother; Kidney disease in her father; Rectal cancer in her mother.    ROS:  Please see the history of present illness. All other systems are reviewed and negative.    PHYSICAL EXAM: VS:  BP 102/70 mmHg  Pulse 61  Ht '5\' 4"'$  (1.626 m)  Wt 156 lb 6.4 oz (70.943 kg)  BMI 26.83 kg/m2 , BMI Body mass index is 26.83 kg/(m^2). GEN: Well nourished, well developed, in no acute distress HEENT: normal Neck: no JVD, no masses. No carotid bruits, but delayed carotid upstrokes Cardiac: RRR with grade 3/6 late-peaking harsh systolic murmur at the RUSB               Respiratory:  clear to auscultation bilaterally, normal work of breathing GI: soft, nontender, nondistended, + BS MS: no deformity or  atrophy Ext: no pretibial edema, pedal pulses 2+= bilaterally Skin: warm and dry, no rash Neuro:  Strength and sensation are intact Psych: euthymic mood, full affect  EKG:  EKG is ordered today. The ekg ordered today shows NSR 61 bpm with occasional PVC's and PAC's  Recent Labs: 11/08/2013: ALT 17; BUN 12.7; Creatinine 0.8; HGB 13.9; Platelets 166; Potassium 4.0; Sodium 137 12/01/2013: TSH 0.96   Lipid Panel     Component Value Date/Time   CHOL 250* 12/01/2013 0955   TRIG 79.0 12/01/2013 0955   HDL 83.00 12/01/2013 0955   CHOLHDL 3 12/01/2013 0955   VLDL 15.8 12/01/2013 0955   LDLCALC 151* 12/01/2013 0955   LDLDIRECT 144.0 11/29/2012 1601      Wt Readings from Last 3 Encounters:  10/10/14 156 lb 6.4 oz (70.943 kg)  09/28/14 156 lb (70.761 kg)  09/11/14 157 lb 2  oz (71.271 kg)     Cardiac Studies Reviewed: 2D Echo: Study Conclusions  - Left ventricle: The cavity size was normal. There was moderate concentric hypertrophy. Systolic function was vigorous. The estimated ejection fraction was in the range of 65% to 70%. Wall motion was normal; there were no regional wall motion abnormalities. Doppler parameters are consistent with abnormal left ventricular relaxation (grade 1 diastolic dysfunction). Doppler parameters are consistent with elevated ventricular end-diastolic filling pressure. - Aortic valve: Bicuspid; moderately thickened, severely calcified leaflets. Valve mobility was restricted. There was severe stenosis. There was mild regurgitation. - Mitral valve: There was mild regurgitation. - Right ventricle: The cavity size was normal. Wall thickness was normal. Systolic function was normal. - Right atrium: The atrium was normal in size. - Tricuspid valve: There was mild regurgitation. - Pulmonic valve: There was mild regurgitation. - Pulmonary arteries: Systolic pressure was within the normal range. PA peak pressure: 33 mm Hg (S). - Inferior vena cava: The vessel was normal in size. - Pericardium, extracardiac: There was no pericardial effusion.  Impressions:  - Bicuspid aortic valve with severe aortic stenosis and mild aortic regurgitation.  ASSESSMENT AND PLAN: 81 yo woman with severe aortic stenosis. I have personally reviewed her echo study. She clearly has a bicuspid aortic valve, now with a mean transaortic gradient of 60 mmHg. While her sx's are minimal, her aortic stenosis has progressed significantly since last years study and critical velocities with a peak velocity over 5 m/s and mean gradient of 60 mmHg warrant consideration of aortic valve replacement. She understands risks of ongoing medical therapy and is willing to consider cardiac surgery. While she has been very interested in TAVR, I don't think  she's a candidate because of bicuspid aortic valve morphology and low-risk status. Plan on cardiac cath followed by referral back to Dr Cyndia Bent for consideration of AVR.   I have reviewed the risks, indications, and alternatives to cardiac catheterization with the patient. Risks include but are not limited to bleeding, infection, vascular injury, stroke, myocardial infection, arrhythmia, kidney injury, radiation-related injury in the case of prolonged fluoroscopy use, emergency cardiac surgery, and death. The patient understands the risks of serious complication is low (<7%).    Current medicines are reviewed with the patient today.  The patient does not have concerns regarding medicines.  Labs/ tests ordered today include:  No orders of the defined types were placed in this encounter.   Deatra James, MD  10/10/2014 11:22 AM    [facility] [address], [place], [place]  [address] Phone: [phone]; Fax: [phone]

## 2014-10-10 NOTE — Patient Instructions (Signed)
Medication Instructions:  Your physician recommends that you continue on your current medications as directed. Please refer to the Current Medication list given to you today.  Labwork: Your physician recommends that you have lab work today: BMP, CBC and PT/INR  Testing/Procedures: Your physician has requested that you have a cardiac catheterization. Cardiac catheterization is used to diagnose and/or treat various heart conditions. Doctors may recommend this procedure for a number of different reasons. The most common reason is to evaluate chest pain. Chest pain can be a symptom of coronary artery disease (CAD), and cardiac catheterization can show whether plaque is narrowing or blocking your heart's arteries. This procedure is also used to evaluate the valves, as well as measure the blood flow and oxygen levels in different parts of your heart. For further information please visit [email or web address]. Please follow instruction sheet, as given.  Follow-Up: We will arrange further follow-up after your cardiac catheterization.   Any Other Special Instructions Will Be Listed Below (If Applicable).

## 2014-10-13 ENCOUNTER — Ambulatory Visit (INDEPENDENT_AMBULATORY_CARE_PROVIDER_SITE_OTHER): Payer: Medicare Other | Admitting: Internal Medicine

## 2014-10-13 ENCOUNTER — Encounter: Payer: Self-pay | Admitting: Internal Medicine

## 2014-10-13 VITALS — BP 124/84 | HR 56 | Temp 97.7°F | Resp 16 | Wt 156.0 lb

## 2014-10-13 DIAGNOSIS — R05 Cough: Secondary | ICD-10-CM

## 2014-10-13 DIAGNOSIS — J310 Chronic rhinitis: Secondary | ICD-10-CM

## 2014-10-13 DIAGNOSIS — R059 Cough, unspecified: Secondary | ICD-10-CM

## 2014-10-13 MED ORDER — HYDROCODONE-HOMATROPINE 5-1.5 MG/5ML PO SYRP: 5.0000 mL | ORAL_SOLUTION | Freq: Three times a day (TID) | ORAL | Status: DC | PRN

## 2014-10-13 MED ORDER — PREDNISONE 10 MG PO TABS: ORAL_TABLET | ORAL | Status: DC

## 2014-10-13 NOTE — Progress Notes (Signed)
   Subjective:    Patient ID: [name], female    DOB: 04/18/1933, 81 y.o.   MRN: [id number]  HPI  She continues to have throat congestion inducing cough. Also when she drinks wine at night she will have a cough which is nonproductive. She denies any significant reflux symptoms. She also denies any upper respiratory tract infection symptoms.  She feels that Flonase has helped. The cough syrup also has been beneficial in allowing her to sleep  She is concerned she is to have valvular heart surgery in the near future  Review of Systems Unexplained weight loss, abdominal pain, significant dyspepsia, dysphagia, melena, rectal bleeding, or persistently small caliber stools are denied.  Frontal headache, facial pain , nasal purulence, dental pain, sore throat , otic pain or otic discharge denied. No fever , chills or sweats. Extrinsic symptoms of itchy, watery eyes, sneezing, or angioedema are denied. There is no sputum production, wheezing,or  paroxysmal nocturnal dyspnea.     Objective:   Physical Exam  General appearance:Adequately nourished; no acute distress or increased work of breathing is present.    Lymphatic: No  lymphadenopathy about the head, neck, or axilla .  Eyes: No conjunctival inflammation or lid edema is present. There is no scleral icterus. Bilateral ptosis is present  Ears:  External ear exam shows no significant lesions or deformities.  Otoscopic examination reveals clear canals, tympanic membranes are intact bilaterally without bulging, retraction, inflammation or discharge.  Nose:  External nasal examination shows no deformity or inflammation. Nasal mucosa are erythematous, especially on the right, without lesions or exudates No septal dislocation or deviation.No obstruction to airflow.   Oral exam: Dental hygiene is good; lips and gums are healthy appearing.There is no oropharyngeal erythema or exudate .  Neck:  No deformities, thyromegaly, masses, or  tenderness noted.   Supple with full range of motion without pain.   Heart:  Normal rate and regular rhythm. S1 and S2 normal without gallop, click, rub or other extra sounds. Grade 1.5 systolic murmur loudest at the base.  Lungs: Breath sounds generally decreased. Chest clear to auscultation; no wheezes, rhonchi,rales ,or rubs present.  Extremities:  No cyanosis, edema, or clubbing  noted    Skin: Warm & dry w/o tenting or jaundice. No significant lesions or rash.        Assessment & Plan:  #1 nonallergic rhinitis  #2 cough secondary to #1  Plan: See orders and recommendations

## 2014-10-13 NOTE — Progress Notes (Signed)
Pre visit review using our clinic review tool, if applicable. No additional management support is needed unless otherwise documented below in the visit note. 

## 2014-10-13 NOTE — Patient Instructions (Signed)

## 2014-10-16 ENCOUNTER — Ambulatory Visit: Payer: Medicare Other | Admitting: Internal Medicine

## 2014-10-17 DIAGNOSIS — I350 Nonrheumatic aortic (valve) stenosis: Secondary | ICD-10-CM

## 2014-10-18 ENCOUNTER — Encounter (HOSPITAL_COMMUNITY): Payer: Self-pay | Admitting: Cardiovascular Disease

## 2014-10-18 ENCOUNTER — Ambulatory Visit (HOSPITAL_COMMUNITY)
Admission: RE | Admit: 2014-10-18 | Discharge: 2014-10-18 | Disposition: A | Discharge status: Home / Self Care | Payer: Medicare Other | Source: Ambulatory Visit | Attending: Cardiovascular Disease | Admitting: Cardiovascular Disease

## 2014-10-18 ENCOUNTER — Encounter (HOSPITAL_COMMUNITY)
Admission: RE | Disposition: A | Discharge status: Home / Self Care | Payer: Medicare Other | Source: Ambulatory Visit | Attending: Cardiovascular Disease

## 2014-10-18 DIAGNOSIS — Z853 Personal history of malignant neoplasm of breast: Secondary | ICD-10-CM | POA: Insufficient documentation

## 2014-10-18 DIAGNOSIS — M8580 Other specified disorders of bone density and structure, unspecified site: Secondary | ICD-10-CM | POA: Insufficient documentation

## 2014-10-18 DIAGNOSIS — E780 Pure hypercholesterolemia: Secondary | ICD-10-CM | POA: Insufficient documentation

## 2014-10-18 DIAGNOSIS — Z87891 Personal history of nicotine dependence: Secondary | ICD-10-CM | POA: Diagnosis not present

## 2014-10-18 DIAGNOSIS — I350 Nonrheumatic aortic (valve) stenosis: Secondary | ICD-10-CM | POA: Diagnosis present

## 2014-10-18 DIAGNOSIS — I341 Nonrheumatic mitral (valve) prolapse: Secondary | ICD-10-CM | POA: Insufficient documentation

## 2014-10-18 HISTORY — PX: CARDIAC CATHETERIZATION: SHX172

## 2014-10-18 LAB — POCT I-STAT 3, VENOUS BLOOD GAS (G3P V)
Acid-base deficit: 1 mmol/L (ref 0.0–2.0)
Acid-base deficit: 1 mmol/L (ref 0.0–2.0)
Bicarbonate: 24.7 mEq/L — ABNORMAL HIGH (ref 20.0–24.0)
Bicarbonate: 24.8 mEq/L — ABNORMAL HIGH (ref 20.0–24.0)
O2 SAT: 79 %
O2 SAT: 99 %
PCO2 VEN: 45.3 mmHg (ref 45.0–50.0)
TCO2: 26 mmol/L (ref 0–100)
TCO2: 26 mmol/L (ref 0–100)
pCO2, Ven: 44.2 mmHg — ABNORMAL LOW (ref 45.0–50.0)
pH, Ven: 7.347 — ABNORMAL HIGH (ref 7.250–7.300)
pH, Ven: 7.356 — ABNORMAL HIGH (ref 7.250–7.300)
pO2, Ven: 162 mmHg — ABNORMAL HIGH (ref 30.0–45.0)
pO2, Ven: 46 mmHg — ABNORMAL HIGH (ref 30.0–45.0)

## 2014-10-18 LAB — POCT I-STAT 3, ART BLOOD GAS (G3+)
Acid-base deficit: 1 mmol/L (ref 0.0–2.0)
Bicarbonate: 24.9 mEq/L — ABNORMAL HIGH (ref 20.0–24.0)
O2 Saturation: 76 %
PCO2 ART: 44.1 mmHg (ref 35.0–45.0)
PO2 ART: 42 mmHg — AB (ref 80.0–100.0)
TCO2: 26 mmol/L (ref 0–100)
pH, Arterial: 7.36 (ref 7.350–7.450)

## 2014-10-18 SURGERY — RIGHT/LEFT HEART CATH AND CORONARY ANGIOGRAPHY

## 2014-10-18 MED ORDER — ASPIRIN 81 MG PO CHEW
81.0000 mg | CHEWABLE_TABLET | ORAL | Status: AC
  Administered 2014-10-18: 81 mg via ORAL

## 2014-10-18 MED ORDER — ACETAMINOPHEN 325 MG PO TABS: 650.0000 mg | ORAL_TABLET | ORAL | Status: DC | PRN

## 2014-10-18 MED ORDER — FENTANYL CITRATE (PF) 100 MCG/2ML IJ SOLN
INTRAMUSCULAR | Status: AC
  Filled 2014-10-18: qty 2

## 2014-10-18 MED ORDER — SODIUM CHLORIDE 0.9 % IJ SOLN: 3.0000 mL | Freq: Two times a day (BID) | INTRAMUSCULAR | Status: DC

## 2014-10-18 MED ORDER — SODIUM CHLORIDE 0.9 % IV SOLN: 250.0000 mL | INTRAVENOUS | Status: DC | PRN

## 2014-10-18 MED ORDER — LIDOCAINE HCL (PF) 1 % IJ SOLN
INTRAMUSCULAR | Status: DC | PRN
  Administered 2014-10-18: 15 mL

## 2014-10-18 MED ORDER — NITROGLYCERIN 1 MG/10 ML FOR IR/CATH LAB
INTRA_ARTERIAL | Status: AC
  Filled 2014-10-18: qty 10

## 2014-10-18 MED ORDER — SODIUM CHLORIDE 0.9 % WEIGHT BASED INFUSION: 1.0000 mL/kg/h | INTRAVENOUS | Status: DC

## 2014-10-18 MED ORDER — ASPIRIN 81 MG PO CHEW
CHEWABLE_TABLET | ORAL | Status: AC
  Filled 2014-10-18: qty 1

## 2014-10-18 MED ORDER — SODIUM CHLORIDE 0.9 % IJ SOLN: 3.0000 mL | INTRAMUSCULAR | Status: DC | PRN

## 2014-10-18 MED ORDER — MIDAZOLAM HCL 2 MG/2ML IJ SOLN
INTRAMUSCULAR | Status: AC
  Filled 2014-10-18: qty 2

## 2014-10-18 MED ORDER — HEPARIN (PORCINE) IN NACL 2-0.9 UNIT/ML-% IJ SOLN
INTRAMUSCULAR | Status: AC
  Filled 2014-10-18: qty 1000

## 2014-10-18 MED ORDER — VERAPAMIL HCL 2.5 MG/ML IV SOLN
INTRAVENOUS | Status: AC
  Filled 2014-10-18: qty 2

## 2014-10-18 MED ORDER — IOHEXOL 350 MG/ML SOLN
INTRAVENOUS | Status: DC | PRN
  Administered 2014-10-18: 70 mL via INTRA_ARTERIAL

## 2014-10-18 MED ORDER — MIDAZOLAM HCL 2 MG/2ML IJ SOLN
INTRAMUSCULAR | Status: DC | PRN
  Administered 2014-10-18: 2 mg via INTRAVENOUS

## 2014-10-18 MED ORDER — ONDANSETRON HCL 4 MG/2ML IJ SOLN: 4.0000 mg | Freq: Four times a day (QID) | INTRAMUSCULAR | Status: DC | PRN

## 2014-10-18 MED ORDER — LIDOCAINE HCL (PF) 1 % IJ SOLN
INTRAMUSCULAR | Status: AC
  Filled 2014-10-18: qty 30

## 2014-10-18 MED ORDER — SODIUM CHLORIDE 0.9 % IV SOLN
INTRAVENOUS | Status: DC
  Administered 2014-10-18: 09:00:00 via INTRAVENOUS

## 2014-10-18 MED ORDER — FENTANYL CITRATE (PF) 100 MCG/2ML IJ SOLN
INTRAMUSCULAR | Status: DC | PRN
  Administered 2014-10-18: 25 ug via INTRAVENOUS

## 2014-10-18 SURGICAL SUPPLY — 22 items
CATH BALLN WEDGE 5F 110CM (CATHETERS) IMPLANT
CATH INFINITI 4FR JR4 MOD (CATHETERS) ×2 IMPLANT
CATH INFINITI 5 FR JL3.5 (CATHETERS) IMPLANT
CATH INFINITI 5FR ANG PIGTAIL (CATHETERS) IMPLANT
CATH INFINITI 5FR MULTPACK ANG (CATHETERS) IMPLANT
CATH INFINITI JR4 5F (CATHETERS) IMPLANT
CATH SITESEER 4F MULTIPACK ANG (CATHETERS) ×2 IMPLANT
CATH SWAN GANZ 7F STRAIGHT (CATHETERS) ×2 IMPLANT
DEVICE RAD COMP TR BAND LRG (VASCULAR PRODUCTS) IMPLANT
GLIDESHEATH SLEND SS 6F .021 (SHEATH) IMPLANT
KIT HEART LEFT (KITS) ×2 IMPLANT
KIT HEART RIGHT NAMIC (KITS) ×2 IMPLANT
PACK CARDIAC CATHETERIZATION (CUSTOM PROCEDURE TRAY) ×2 IMPLANT
SHEATH FAST CATH BRACH 5F 5CM (SHEATH) IMPLANT
SHEATH PINNACLE 4F 10CM (SHEATH) ×2 IMPLANT
SHEATH PINNACLE 5F 10CM (SHEATH) IMPLANT
SHEATH PINNACLE 7F 10CM (SHEATH) ×2 IMPLANT
SYR MEDRAD MARK V 150ML (SYRINGE) IMPLANT
TRANSDUCER W/STOPCOCK (MISCELLANEOUS) ×4 IMPLANT
TUBING CIL FLEX 10 FLL-RA (TUBING) IMPLANT
WIRE EMERALD 3MM-J .035X150CM (WIRE) ×2 IMPLANT
WIRE SAFE-T 1.5MM-J .035X260CM (WIRE) IMPLANT

## 2014-10-18 NOTE — Progress Notes (Addendum)
Site area: right groin a 4 french arterial and 7 french venous sheath was removed  Site Prior to Removal:  Level 0  Pressure Applied For 20 MINUTES    Minutes Beginning at 1105am  Manual:   Yes.    Patient Status During Pull:  stable  Post Pull Groin Site:  Level 0  Post Pull Instructions Given:  Yes.    Post Pull Pulses Present:  Yes.    Dressing Applied:  Yes.    Comments:  VS remain stable during sheath pull.  Pt remain stable during sheath pull.

## 2014-10-18 NOTE — H&P (View-Only) (Signed)
Cardiology Office Note   Date:  10/10/2014   ID:  [name], DOB 01/28/1933, MRN [id number]  PCP:  Gwendolyn Grant, MD  Cardiologist:  [name], MD    No chief complaint on file.    History of Present Illness: [name] is a 81 y.o. female who presents for follow-up of aortic stenosis. The patient is followed for severe aortic stenosis. She is thought to have a bicuspid aortic valve. She also has bileaflet mitral valve prolapse with mild mitral regurgitation.  She remains physically active with regular walking for exercise. She's not playing as much tennis as she has in the past and now primarily on plays doubles tennis when she is in [place]. She denies chest pain, shortness of breath, or syncope. Overall feels like she's doing ok.    Past Medical History  Diagnosis Date  . Hypercholesteremia   . Osteopenia   . Ulcer 2012    bleeding gastric Ulcer  . Breast cancer     right  . Aortic valve stenosis, severe   . Hiatal hernia     Past Surgical History  Procedure Laterality Date  . Breast surgery    . Tonsillectomy    . Parathyroidectomy    . Cosmetic surgery      face    Current Outpatient Prescriptions  Medication Sig Dispense Refill  . ALPRAZolam (XANAX) 0.5 MG tablet Take 1 tablet (0.5 mg total) by mouth at bedtime. May take additional dose as needed for stress or abdominal pain related to IBS. 120 tablet 2  . CALCIUM CITRATE PO Take 500 mg by mouth daily.    . Cholecalciferol (VITAMIN D-3) 1000 UNITS CAPS Take by mouth 1 day or 1 dose.    . fluticasone (FLONASE) 50 MCG/ACT nasal spray as needed.     Marland Kitchen HYDROcodone-homatropine (HYCODAN) 5-1.5 MG/5ML syrup Take 5 mLs by mouth every 8 (eight) hours as needed for cough. 120 mL 0  . LORazepam (ATIVAN) 0.5 MG tablet Take 0.5 mg by mouth as needed for anxiety.    . Magnesium 400 MG CAPS Take by mouth daily.    . Red Yeast Rice Extract ([employer] RED YEAST RICE PO) Take 600 mg by mouth daily.      No  current facility-administered medications for this visit.    Allergies:   Amoxicillin-pot clavulanate; Penicillins; and Sulfonamide derivatives   Social History:  The patient  reports that she has quit smoking. She has never used smokeless tobacco. She reports that she drinks about 4.2 oz of alcohol per week. She reports that she does not use illicit drugs.   Family History:  The patient's  family history includes Hypertension in her mother; Kidney disease in her father; Rectal cancer in her mother.    ROS:  Please see the history of present illness. All other systems are reviewed and negative.    PHYSICAL EXAM: VS:  BP 102/70 mmHg  Pulse 61  Ht '5\' 4"'$  (1.626 m)  Wt 156 lb 6.4 oz (70.943 kg)  BMI 26.83 kg/m2 , BMI Body mass index is 26.83 kg/(m^2). GEN: Well nourished, well developed, in no acute distress HEENT: normal Neck: no JVD, no masses. No carotid bruits, but delayed carotid upstrokes Cardiac: RRR with grade 3/6 late-peaking harsh systolic murmur at the RUSB               Respiratory:  clear to auscultation bilaterally, normal work of breathing GI: soft, nontender, nondistended, + BS MS: no deformity or  atrophy Ext: no pretibial edema, pedal pulses 2+= bilaterally Skin: warm and dry, no rash Neuro:  Strength and sensation are intact Psych: euthymic mood, full affect  EKG:  EKG is ordered today. The ekg ordered today shows NSR 61 bpm with occasional PVC's and PAC's  Recent Labs: 11/08/2013: ALT 17; BUN 12.7; Creatinine 0.8; HGB 13.9; Platelets 166; Potassium 4.0; Sodium 137 12/01/2013: TSH 0.96   Lipid Panel     Component Value Date/Time   CHOL 250* 12/01/2013 0955   TRIG 79.0 12/01/2013 0955   HDL 83.00 12/01/2013 0955   CHOLHDL 3 12/01/2013 0955   VLDL 15.8 12/01/2013 0955   LDLCALC 151* 12/01/2013 0955   LDLDIRECT 144.0 11/29/2012 1601      Wt Readings from Last 3 Encounters:  10/10/14 156 lb 6.4 oz (70.943 kg)  09/28/14 156 lb (70.761 kg)  09/11/14 157 lb 2  oz (71.271 kg)     Cardiac Studies Reviewed: 2D Echo: Study Conclusions  - Left ventricle: The cavity size was normal. There was moderate concentric hypertrophy. Systolic function was vigorous. The estimated ejection fraction was in the range of 65% to 70%. Wall motion was normal; there were no regional wall motion abnormalities. Doppler parameters are consistent with abnormal left ventricular relaxation (grade 1 diastolic dysfunction). Doppler parameters are consistent with elevated ventricular end-diastolic filling pressure. - Aortic valve: Bicuspid; moderately thickened, severely calcified leaflets. Valve mobility was restricted. There was severe stenosis. There was mild regurgitation. - Mitral valve: There was mild regurgitation. - Right ventricle: The cavity size was normal. Wall thickness was normal. Systolic function was normal. - Right atrium: The atrium was normal in size. - Tricuspid valve: There was mild regurgitation. - Pulmonic valve: There was mild regurgitation. - Pulmonary arteries: Systolic pressure was within the normal range. PA peak pressure: 33 mm Hg (S). - Inferior vena cava: The vessel was normal in size. - Pericardium, extracardiac: There was no pericardial effusion.  Impressions:  - Bicuspid aortic valve with severe aortic stenosis and mild aortic regurgitation.  ASSESSMENT AND PLAN: 81 yo woman with severe aortic stenosis. I have personally reviewed her echo study. She clearly has a bicuspid aortic valve, now with a mean transaortic gradient of 60 mmHg. While her sx's are minimal, her aortic stenosis has progressed significantly since last years study and critical velocities with a peak velocity over 5 m/s and mean gradient of 60 mmHg warrant consideration of aortic valve replacement. She understands risks of ongoing medical therapy and is willing to consider cardiac surgery. While she has been very interested in TAVR, I don't think  she's a candidate because of bicuspid aortic valve morphology and low-risk status. Plan on cardiac cath followed by referral back to Dr Cyndia Bent for consideration of AVR.   I have reviewed the risks, indications, and alternatives to cardiac catheterization with the patient. Risks include but are not limited to bleeding, infection, vascular injury, stroke, myocardial infection, arrhythmia, kidney injury, radiation-related injury in the case of prolonged fluoroscopy use, emergency cardiac surgery, and death. The patient understands the risks of serious complication is low (<7%).    Current medicines are reviewed with the patient today.  The patient does not have concerns regarding medicines.  Labs/ tests ordered today include:  No orders of the defined types were placed in this encounter.   Deatra James, MD  10/10/2014 11:22 AM    [facility] [address], [place], [place]  [address] Phone: [phone]; Fax: [phone]

## 2014-10-18 NOTE — Interval H&P Note (Signed)
History and Physical Interval Note:  10/18/2014 10:07 AM  [name]  has presented today for surgery, with the diagnosis of arotic stenosis  The various methods of treatment have been discussed with the patient and family. After consideration of risks, benefits and other options for treatment, the patient has consented to  Procedure(s): Right/Left Heart Cath and Coronary Angiography (N/A) as a surgical intervention .  The patient's history has been reviewed, patient examined, no change in status, stable for surgery.  I have reviewed the patient's chart and labs.  Questions were answered to the patient's satisfaction.     [name]

## 2014-10-18 NOTE — Discharge Instructions (Signed)

## 2014-10-19 ENCOUNTER — Other Ambulatory Visit: Payer: Self-pay | Admitting: *Deleted

## 2014-10-19 ENCOUNTER — Institutional Professional Consult (permissible substitution) (INDEPENDENT_AMBULATORY_CARE_PROVIDER_SITE_OTHER): Payer: Medicare Other | Admitting: Surgery

## 2014-10-19 ENCOUNTER — Telehealth: Payer: Self-pay | Admitting: Cardiovascular Disease

## 2014-10-19 ENCOUNTER — Encounter: Payer: Self-pay | Admitting: Surgery

## 2014-10-19 VITALS — BP 122/78 | HR 62 | Resp 16 | Ht 64.0 in | Wt 151.0 lb

## 2014-10-19 DIAGNOSIS — I350 Nonrheumatic aortic (valve) stenosis: Secondary | ICD-10-CM

## 2014-10-19 MED FILL — Heparin Sodium (Porcine) 2 Unit/ML in Sodium Chloride 0.9%: INTRAMUSCULAR | Qty: 1000 | Status: AC

## 2014-10-19 NOTE — Telephone Encounter (Signed)
New message      Pt has a question about her bandage from her cath.  Should she put a new bandaide on it daily?

## 2014-10-19 NOTE — Telephone Encounter (Signed)
I spoke with the pt and she has placed a band aid on her groin today.  She questioned if this is something she needs to do for a few days.  I made her aware that this is not required but if she is more comfortable she can do this for 2-3 days.  I also advised her that her underwear could rub at her groin site and a band aid for a few days will cut down on rubbing this area.

## 2014-10-23 ENCOUNTER — Encounter: Payer: Self-pay | Admitting: Surgery

## 2014-10-23 ENCOUNTER — Other Ambulatory Visit: Payer: Self-pay

## 2014-10-23 ENCOUNTER — Telehealth: Payer: Self-pay | Admitting: Cardiovascular Disease

## 2014-10-23 NOTE — Telephone Encounter (Signed)
I spoke with the pt and she developed bruising yesterday at her groin site and down her leg.  The pt denies any swelling or pain at her groin site. The pt would like to have some one look at her site today if possible.  I advised the pt that she can come to the office and we will look at her groin.

## 2014-10-23 NOTE — Telephone Encounter (Signed)
The pt came into the office for groin check.  Bruising noted in supra pubic area and upper right thigh.  Area soft with no hematoma. Dr Burt Knack also assessed groin and reassured patient.

## 2014-10-23 NOTE — Telephone Encounter (Signed)
New message       Pt had heart cath on Wednesday. C/o Bruising on right leg has gotten worse.  Please advise

## 2014-10-23 NOTE — Progress Notes (Signed)
Cardiothoracic Surgery Consultation   PCP is Gwendolyn Grant, MD Referring Provider is [name], MD  Chief Complaint  Patient presents with  . Aortic Stenosis    severe...eval for surgery.[name] 10/10/14, CATH 09/17/14    HPI:  The patient is a 81 year old woman with a history of known severe aortic stenosis with suspected bicuspid aortic valve disease. I initially saw her in August 2015 and her echo on 09/26/2013 showed a mean gradient of 49 and a peak of 64 with an AVA of 0.54 by VTI and 0.61 by Vmax. Her aorta is normal sized. The mean gradient 1 year prior was 47 mm Hg. Left ventricular function remained normal. The patient was asymptomatic. She spends the winters in [place] and remains active playing doubles tennis without any fatigue, shortness of breath, chest pain or dizziness. She did have one episode of near syncope after eating lunch one day with dizziness and nausea and noted to be pale and diaphoretic with bradycardia. She was hospitalized and observed and no abnormality was found. It was felt to be vagal. She has not had any further episodes. She was not felt to be a TAVR candidate with a bicuspid valve and a low operative mortality and did not want to have open surgical AVR since she was asymptomatic. Over the past year she has continued to remain active playing tennis and denies any symptoms with that. She says that the only time she has shortness of breath and fatigue is when walking up hills. She has had no chest pain or shortness of breath. A recent echo shows that her mean gradient has increased to 60 mm Hg with a DI of 0.18, AVA of 0.51 cm 2.  Past Medical History  Diagnosis Date  . Hypercholesteremia   . Osteopenia   . Ulcer 2012    bleeding gastric Ulcer  . Breast cancer     right  . Aortic valve stenosis, severe   . Hiatal hernia     Past Surgical History  Procedure Laterality Date  . Breast surgery    . Tonsillectomy    . Parathyroidectomy    .  Cosmetic surgery      face  . Cardiac catheterization N/A 10/18/2014    Procedure: Right/Left Heart Cath and Coronary Angiography;  Surgeon: [name], MD;  Location: [facility];  Service: Cardiovascular;  Laterality: N/A;    Family History  Problem Relation Age of Onset  . Hypertension Mother   . Rectal cancer Mother   . Kidney disease Father     Social History History  Substance Use Topics  . Smoking status: Former Research scientist (life sciences)  . Smokeless tobacco: Never Used  . Alcohol Use: 4.2 oz/week    7 Glasses of wine per week    Current Outpatient Prescriptions  Medication Sig Dispense Refill  . ALPRAZolam (XANAX) 0.5 MG tablet Take 1 tablet (0.5 mg total) by mouth at bedtime. May take additional dose as needed for stress or abdominal pain related to IBS. 120 tablet 2  . CALCIUM CITRATE PO Take 500 mg by mouth daily.    . chloral hydrate (SOMNOTE) 500 MG capsule Take 500 mg by mouth at bedtime.    . Cholecalciferol (VITAMIN D-3) 1000 UNITS CAPS Take 1 capsule by mouth daily.     . fluticasone (FLONASE) 50 MCG/ACT nasal spray Place 1 spray into both nostrils as needed for allergies.     Marland Kitchen HYDROcodone-homatropine (HYCODAN) 5-1.5 MG/5ML syrup Take 5 mLs by mouth  every 8 (eight) hours as needed for cough. 120 mL 0  . LORazepam (ATIVAN) 0.5 MG tablet Take 0.5 mg by mouth as needed for anxiety.    . Magnesium 400 MG CAPS Take 400 mg by mouth daily.     Vladimir Faster Glycol-Propyl Glycol (SYSTANE ULTRA PF) 0.4-0.3 % SOLN Apply 1 drop to eye 2 (two) times daily.    . Red Yeast Rice Extract ([employer] RED YEAST RICE PO) Take 600 mg by mouth daily.      No current facility-administered medications for this visit.    Allergies  Allergen Reactions  . Amoxicillin-Pot Clavulanate     REACTION: hives  . Penicillins Hives  . Sulfonamide Derivatives     REACTION: hives    Review of Systems  Constitutional: Negative for activity change and appetite change.       Fatigue only with walking up  hills  HENT: Negative.   Eyes: Negative.   Respiratory: Positive for shortness of breath. Negative for cough and chest tightness.        Shortness of breath is only with walking up hills  Cardiovascular: Negative for chest pain, palpitations and leg swelling.  Gastrointestinal: Negative.   Endocrine: Negative.   Genitourinary: Negative.   Musculoskeletal: Negative.   Skin: Negative.   Allergic/Immunologic: Negative.   Neurological: Negative.  Negative for dizziness and syncope.  Hematological: Negative.   Psychiatric/Behavioral: Negative.     BP 122/78 mmHg  Pulse 62  Resp 16  Ht '5\' 4"'$  (1.626 m)  Wt 151 lb (68.493 kg)  BMI 25.91 kg/m2  SpO2 98% Physical Exam  Constitutional: She is oriented to person, place, and time. She appears well-developed and well-nourished. No distress.  HENT:  Head: Normocephalic and atraumatic.  Mouth/Throat: Oropharynx is clear and moist.  Eyes: EOM are normal.  Neck: Normal range of motion. Neck supple. No JVD present. No thyromegaly present.  Cardiovascular: Normal rate, regular rhythm and intact distal pulses.   Murmur heard. 3/6 crescendo/decrescendo murmur along RSB. No diastolic murmur  Pulmonary/Chest: Effort normal and breath sounds normal. No respiratory distress. She has no rales.  Abdominal: Soft. Bowel sounds are normal. She exhibits no distension and no mass. There is no tenderness.  Musculoskeletal: Normal range of motion. She exhibits no edema.  Neurological: She is alert and oriented to person, place, and time. She has normal strength. No cranial nerve deficit or sensory deficit.  Skin: Skin is warm and dry.  Psychiatric: She has a normal mood and affect.     Diagnostic Tests:              [facility]            [address] [place], [place] [address]               [phone]  ------------------------------------------------------------------- Transthoracic Echocardiography  Patient:  [name], [name] MR #:    [id number] Study Date: 10/10/2014 Gender:   F Age:    25 Height:   162.6 cm Weight:   68.5 kg BSA:    1.77 m^2 Pt. Status: Room:  ATTENDING  Default, Provider [phone] [place] SONOGRAPHER Cindy Hazy, RDCS PERFORMING  [facility], Outpatient  cc:  ------------------------------------------------------------------- LV EF: 65% -  70%  ------------------------------------------------------------------- Indications:   I35.0 Aortic stenosis.  ------------------------------------------------------------------- History:  PMH: Acquired from the patient and from the patient&'s chart. Risk factors:  Hypercholesterolemia.  ------------------------------------------------------------------- Study Conclusions  - Left ventricle: The cavity size was normal. There was moderate concentric hypertrophy. Systolic function was vigorous. The estimated ejection fraction was in the range of 65% to 70%. Wall motion was normal; there were no regional wall motion abnormalities. Doppler parameters are consistent with abnormal left ventricular relaxation (grade 1 diastolic dysfunction). Doppler parameters are consistent with elevated ventricular end-diastolic filling pressure. - Aortic valve: Bicuspid; moderately thickened, severely calcified leaflets. Valve mobility was restricted. There was severe stenosis. There was mild regurgitation. - Mitral valve: There was mild regurgitation. - Right ventricle: The cavity size was normal. Wall thickness was normal. Systolic function was normal. - Right atrium: The atrium was normal in size. - Tricuspid valve: There was mild regurgitation. - Pulmonic valve: There was mild regurgitation. - Pulmonary  arteries: Systolic pressure was within the normal range. PA peak pressure: 33 mm Hg (S). - Inferior vena cava: The vessel was normal in size. - Pericardium, extracardiac: There was no pericardial effusion.  Impressions:  - Bicuspid aortic valve with severe aortic stenosis and mild aortic regurgitation.  Transthoracic echocardiography. M-mode, complete 2D, spectral Doppler, and color Doppler. Birthdate: Patient birthdate: May 14, 1932. Age: Patient is 81 yr old. Sex: Gender: female. BMI: 25.9 kg/m^2. Blood pressure:   118/80 Patient status: Outpatient. Study date: Study date: 10/10/2014. Study time: 09:50 AM. Location: [facility]  -------------------------------------------------------------------  ------------------------------------------------------------------- Left ventricle: The cavity size was normal. There was moderate concentric hypertrophy. Systolic function was vigorous. The estimated ejection fraction was in the range of 65% to 70%. Wall motion was normal; there were no regional wall motion abnormalities. Doppler parameters are consistent with abnormal left ventricular relaxation (grade 1 diastolic dysfunction). Doppler parameters are consistent with elevated ventricular end-diastolic filling pressure.  ------------------------------------------------------------------- Aortic valve:  Bicuspid; moderately thickened, severely calcified leaflets. Valve mobility was restricted. Doppler:  There was severe stenosis.  There was mild regurgitation.  VTI ratio of LVOT to aortic valve: 0.21. Valve area (VTI): 0.59 cm^2. Indexed valve area (VTI): 0.33 cm^2/m^2. Mean velocity ratio of LVOT to aortic valve: 0.18. Valve area (Vmean): 0.51 cm^2. Indexed valve area (Vmean): 0.29 cm^2/m^2.  Mean gradient (S): 60 mm Hg.  ------------------------------------------------------------------- Aorta: Aortic root: The aortic root was normal in  size. Ascending aorta: The ascending aorta was normal in size.  ------------------------------------------------------------------- Mitral valve:  Structurally normal valve.  Mobility was not restricted. Doppler: Transvalvular velocity was within the normal range. There was no evidence for stenosis. There was mild regurgitation.  Peak gradient (D): 2 mm Hg.  ------------------------------------------------------------------- Left atrium: The atrium was normal in size.  ------------------------------------------------------------------- Right ventricle: The cavity size was normal. Wall thickness was normal. Systolic function was normal.  ------------------------------------------------------------------- Pulmonic valve:  Structurally normal valve.  Cusp separation was normal. Doppler: Transvalvular velocity was within the normal range. There was no evidence for stenosis. There was mild regurgitation.  ------------------------------------------------------------------- Tricuspid valve:  Structurally normal valve.  Doppler: Transvalvular velocity was within the normal range. There was mild regurgitation.  ------------------------------------------------------------------- Pulmonary artery:  The main pulmonary artery was normal-sized. Systolic pressure was within the normal range.  ------------------------------------------------------------------- Right atrium: The atrium was normal in size.  ------------------------------------------------------------------- Pericardium: There was no pericardial effusion.  ------------------------------------------------------------------- Systemic veins: Inferior vena cava: The vessel was normal in size.  ------------------------------------------------------------------- Measurements  Left ventricle              Value     Reference LV ID, ED, PLAX chordal      (L)  36  mm    43 -  52 LV ID, ES, PLAX chordal      (L)   21  mm    23 - 38 LV fx shortening, PLAX chordal      42  %    >=29 LV PW thickness, ED            13  mm    --------- IVS/LV PW ratio, ED            1.15      <=1.3 Stroke volume, 2D             73  ml    --------- Stroke volume/bsa, 2D           41  ml/m^2  --------- LV e&', lateral              4.7  cm/s   --------- LV E/e&', lateral             15.28     --------- LV e&', medial               3.89 cm/s   --------- LV E/e&', medial              18.46     --------- LV e&', average              4.3  cm/s   --------- LV E/e&', average             16.72     ---------  Ventricular septum            Value     Reference IVS thickness, ED             15  mm    ---------  LVOT                   Value     Reference LVOT ID, S                19  mm    --------- LVOT area                 2.84 cm^2   --------- LVOT mean velocity, S           65.4 cm/s   --------- LVOT VTI, S                25.8 cm    ---------  Aortic valve               Value     Reference Aortic valve mean velocity, S       367  cm/s   --------- Aortic valve VTI, S            124  cm    --------- Aortic mean gradient, S          60  mm Hg  --------- VTI ratio, LVOT/AV            0.21      --------- Aortic valve area, VTI          0.59 cm^2   --------- Aortic valve area/bsa, VTI        0.33 cm^2/m^2 --------- Velocity ratio, mean, LVOT/AV       0.18      --------- Aortic valve area, mean velocity     0.51 cm^2   --------- Aortic  valve area/bsa, mean        0.29  cm^2/m^2 --------- velocity Aortic regurg pressure half-time     710  ms    ---------  Aorta                   Value     Reference Aortic root ID, ED            31  mm    ---------  Left atrium                Value     Reference LA ID, A-P, ES              36  mm    --------- LA ID/bsa, A-P              2.03 cm/m^2  <=2.2 LA volume, S               37.5 ml    --------- LA volume/bsa, S             21.2 ml/m^2  --------- LA volume, ES, 1-p A4C          33.6 ml    --------- LA volume/bsa, ES, 1-p A4C        19  ml/m^2  --------- LA volume, ES, 1-p A2C          42.1 ml    --------- LA volume/bsa, ES, 1-p A2C        23.7 ml/m^2  ---------  Mitral valve               Value     Reference Mitral E-wave peak velocity        71.8 cm/s   --------- Mitral A-wave peak velocity        91.7 cm/s   --------- Mitral deceleration time     (H)   377  ms    150 - 230 Mitral peak gradient, D          2   mm Hg  --------- Mitral E/A ratio, peak          0.78      ---------  Pulmonary arteries            Value     Reference PA pressure, S, DP        (H)   33  mm Hg  <=30  Tricuspid valve              Value     Reference Tricuspid regurg peak velocity      271  cm/s   --------- Tricuspid peak RV-RA gradient       29  mm Hg  ---------  Right ventricle              Value     Reference RV s&', lateral, S             11.5 cm/s   ---------  Legend: (L) and (H) mark values outside specified reference  range.  ------------------------------------------------------------------- Prepared and Electronically Authenticated by  Ena Dawley, M.D. 2016-06-14T13:20:47   INDICATION: 81 year old woman who is been followed for severe bicuspid aortic stenosis. She is relatively active and in good health. She has developed progressive severe aortic stenosis by serial echocardiography, now with a mean transaortic valve gradient of 60 mmHg. She is referred for cardiac catheterization in the setting of what now appears to be very severe aortic stenosis based on noninvasive hemodynamic data.  PROCEDURAL DETAILS: The right groin was prepped, draped, and anesthetized  with 1% lidocaine. Using the modified Seldinger technique a 4 French sheath was placed in the right femoral artery and a 7 French sheath was placed in the right femoral vein. A Swan-Ganz catheter was used for the right heart catheterization. Standard protocol was followed for recording of right heart pressures and sampling of oxygen saturations. Fick cardiac output was calculated. Standard Judkins catheters were used for selective coronary angiography. The aortic valve was not crossed. There were no immediate procedural complications. The patient was transferred to the post catheterization recovery area for further monitoring. There were no immediate complications during the procedure.    Conclusion    Final conclusions: 1. Widely patent coronary arteries without evidence of obstructive coronary artery disease 2. Bulky calcification of the aortic valve with restricted mobility based on fluoroscopic assessment 3. Essentially normal right heart pressures and normal cardiac output  Plan: refer to Dr Cyndia Bent for consideration of aortic valve replacement    Coronary Findings    Dominance: Right   Left Main  The vessel is angiographically normal.     Left Anterior Descending  The vessel is angiographically normal.   . Second Diagonal  Branch   The vessel is large in size.     Left Circumflex  The vessel is angiographically normal.     Right Coronary Artery  The vessel is angiographically normal.      Left Heart    Aortic Valve There is severe aortic valve stenosis. The aortic valve is calcified. There is restricted aortic valve motion. The aortic valve is congenitally bicuspid.    Coronary Diagrams    Diagnostic Diagram            Implants    Name ID Temporary Type Supply   No information to display    Hemo Data       Most Recent Value   Fick Cardiac Output  5.82 L/min   Fick Cardiac Output Index  3.33 (L/min)/BSA   RA A Wave  11 mmHg   RA V Wave  8 mmHg   RA Mean  6 mmHg   RV Systolic Pressure  42 mmHg   RV Diastolic Pressure  4 mmHg   RV EDP  12 mmHg   PA Systolic Pressure  42 mmHg   PA Diastolic Pressure  11 mmHg   PA Mean  23 mmHg   PW A Wave  14 mmHg   PW V Wave  13 mmHg   PW Mean  11 mmHg   AO Systolic Pressure  643 mmHg   AO Diastolic Pressure  73 mmHg   AO Mean  101 mmHg   QP/QS  0.87   TPVR Index  7.95 HRUI   TSVR Index  30.36 HRUI   PVR SVR Ratio  0.15   TPVR/TSVR Ratio  0.26    Order-Level Documents:    There are no order-level documents.    Encounter-Level Documents - 10/10/14:      Scan on 10/19/2014 10:10 AM by Provider Default, MDScan on 10/19/2014 10:10 AM by Provider Default, MD     Scan on 10/19/2014 10:07 AM by Provider Default, MDScan on 10/19/2014 10:07 AM by Provider Default, MD     Scan on 10/18/2014 10:46 AM by Provider Default, MDScan on 10/18/2014 10:46 AM by Provider Default, MD     Electronic signature on 10/18/2014 7:56 AM    Signed    Electronically signed by [name], MD on 10/18/14 at 1057 EDT    Impression:  I  have personally reviewed her echocardiograms and cardiac cath films. She has severe aortic stenosis with a bicuspid valve that is severely calcified and the mean gradient is now 44. She is  minimally symptomatic with shortness of breath and fatigue only with walking up inclines. I agree with Dr. Burt Knack that the best treatment for her is AVR since her gradient has increased from 71 to 60 over the past year. I discussed the risks of continued observation including sudden decompensation with syncope, pulmonary edema or sudden death, and progressive LV dysfunction. She is not a TAVR candidate due to her bicuspid aortic valve and low operative mortality and I have explained this to her and her husband and daughter with her today and her other daughter who is a MSW in cardiology social work and is conferenced in on our visit today by their cell phone. I discussed the use of a tissue valve and the benefits and risk of that choice for her. She is in agreement with that. I discussed the operative procedure with the patient and family including alternatives, benefits and risks; including but not limited to bleeding, blood transfusion, infection, stroke, myocardial infarction, graft failure, heart block requiring a permanent pacemaker, organ dysfunction, and death.  Weslie L Evetts understands and agrees to proceed.    Plan:  AVR using a tissue valve on November 16, 2014.  Gaye Pollack, MD [facility] [phone]

## 2014-10-24 ENCOUNTER — Other Ambulatory Visit: Payer: Self-pay

## 2014-10-27 ENCOUNTER — Other Ambulatory Visit: Payer: Self-pay

## 2014-10-27 DIAGNOSIS — Z853 Personal history of malignant neoplasm of breast: Secondary | ICD-10-CM

## 2014-10-27 NOTE — Progress Notes (Signed)
This will need to be called into specialty pharmacy before august.

## 2014-10-31 ENCOUNTER — Other Ambulatory Visit (HOSPITAL_BASED_OUTPATIENT_CLINIC_OR_DEPARTMENT_OTHER): Payer: Medicare Other

## 2014-10-31 ENCOUNTER — Encounter: Payer: Medicare Other | Admitting: Surgery

## 2014-10-31 DIAGNOSIS — Z853 Personal history of malignant neoplasm of breast: Secondary | ICD-10-CM

## 2014-10-31 LAB — CBC WITH DIFFERENTIAL/PLATELET
BASO%: 0.6 % (ref 0.0–2.0)
Basophils Absolute: 0 10*3/uL (ref 0.0–0.1)
EOS ABS: 0.1 10*3/uL (ref 0.0–0.5)
EOS%: 1.3 % (ref 0.0–7.0)
HCT: 40.7 % (ref 34.8–46.6)
HGB: 13.5 g/dL (ref 11.6–15.9)
LYMPH#: 2.2 10*3/uL (ref 0.9–3.3)
LYMPH%: 27.1 % (ref 14.0–49.7)
MCH: 31.6 pg (ref 25.1–34.0)
MCHC: 33.2 g/dL (ref 31.5–36.0)
MCV: 95.4 fL (ref 79.5–101.0)
MONO#: 0.6 10*3/uL (ref 0.1–0.9)
MONO%: 7 % (ref 0.0–14.0)
NEUT%: 64 % (ref 38.4–76.8)
NEUTROS ABS: 5.1 10*3/uL (ref 1.5–6.5)
Platelets: 190 10*3/uL (ref 145–400)
RBC: 4.26 10*6/uL (ref 3.70–5.45)
RDW: 13.2 % (ref 11.2–14.5)
WBC: 8 10*3/uL (ref 3.9–10.3)

## 2014-10-31 LAB — COMPREHENSIVE METABOLIC PANEL (CC13)
ALBUMIN: 3.8 g/dL (ref 3.5–5.0)
ALT: 20 U/L (ref 0–55)
ANION GAP: 10 meq/L (ref 3–11)
AST: 25 U/L (ref 5–34)
Alkaline Phosphatase: 63 U/L (ref 40–150)
BUN: 15 mg/dL (ref 7.0–26.0)
CALCIUM: 9 mg/dL (ref 8.4–10.4)
CO2: 24 mEq/L (ref 22–29)
Chloride: 101 mEq/L (ref 98–109)
Creatinine: 0.8 mg/dL (ref 0.6–1.1)
EGFR: 70 mL/min/{1.73_m2} — ABNORMAL LOW (ref >90)
GLUCOSE: 95 mg/dL (ref 70–140)
POTASSIUM: 4.3 meq/L (ref 3.5–5.1)
Sodium: 135 mEq/L — ABNORMAL LOW (ref 136–145)
TOTAL PROTEIN: 6.3 g/dL — AB (ref 6.4–8.3)
Total Bilirubin: 0.66 mg/dL (ref 0.20–1.20)

## 2014-11-02 ENCOUNTER — Other Ambulatory Visit: Payer: Medicare Other

## 2014-11-02 ENCOUNTER — Telehealth: Payer: Self-pay | Admitting: Cardiovascular Disease

## 2014-11-02 NOTE — Telephone Encounter (Signed)
New message      Pt is due to have open heart surgery on 11-16-14.  She has a question she want to ask Dr Burt Knack only.  She is free today this am and after 4:15 today.  She is free tomorrow until 12:30.  She only want to ask Dr Burt Knack.  She is having major surgery and want to ask him a question that only Dr Burt Knack can answer.  She would not tell me what she wanted.

## 2014-11-03 NOTE — Telephone Encounter (Signed)
Called patient last night and answered ?'s.   [name] 11/03/2014 12:31 PM

## 2014-11-06 NOTE — Progress Notes (Signed)
Called pharmacy to confirm the rx is sent in properly since the rx changed in epic

## 2014-11-07 ENCOUNTER — Ambulatory Visit: Payer: Medicare Other | Admitting: Hematology and Oncology

## 2014-11-07 ENCOUNTER — Encounter: Payer: Self-pay | Admitting: Hematology and Oncology

## 2014-11-07 ENCOUNTER — Ambulatory Visit (HOSPITAL_BASED_OUTPATIENT_CLINIC_OR_DEPARTMENT_OTHER): Payer: Medicare Other | Admitting: Hematology and Oncology

## 2014-11-07 ENCOUNTER — Encounter: Payer: Self-pay | Admitting: Genetic Counselor

## 2014-11-07 ENCOUNTER — Telehealth: Payer: Self-pay | Admitting: Hematology and Oncology

## 2014-11-07 VITALS — BP 120/62 | HR 60 | Temp 98.1°F | Resp 18 | Ht 64.0 in | Wt 158.5 lb

## 2014-11-07 DIAGNOSIS — L821 Other seborrheic keratosis: Secondary | ICD-10-CM | POA: Diagnosis not present

## 2014-11-07 DIAGNOSIS — C50911 Malignant neoplasm of unspecified site of right female breast: Secondary | ICD-10-CM

## 2014-11-07 DIAGNOSIS — K130 Diseases of lips: Secondary | ICD-10-CM | POA: Diagnosis not present

## 2014-11-07 DIAGNOSIS — M899 Disorder of bone, unspecified: Secondary | ICD-10-CM

## 2014-11-07 DIAGNOSIS — Z853 Personal history of malignant neoplasm of breast: Secondary | ICD-10-CM | POA: Diagnosis present

## 2014-11-07 MED ORDER — CHLORAL HYDRATE 500 MG PO CAPS: 500.0000 mg | ORAL_CAPSULE | Freq: Every day | ORAL | Status: DC

## 2014-11-07 NOTE — Progress Notes (Signed)
Patient Care Team: Rowe Clack, MD as PCP - General (Internal Medicine)  DIAGNOSIS: No matching staging information was found for the patient.  SUMMARY OF ONCOLOGIC HISTORY:   Breast cancer, right breast   02/16/2004 Initial Diagnosis Right breast mass biopsy: Invasive mammary cancer, MRI revealed 1.3 cm mass upper inner quadrant right breast   03/05/2004 Surgery Right lumpectomy: 0.9 cm grade 1 invasive lobular cancer, ER positive, PR negative, HER-2 negative, Ki-67 5%, sentinel lymph nodes negative   04/08/2004 - 05/09/2004 Radiation Therapy Adjuvant radiation therapy   06/10/2004 - 06/09/2009 Anti-estrogen oral therapy MA-27 trial and placed on the Aromasin arm. She completed one year of Aromasin, and it was discontinued due to severe joint aches and pains, followed by Tamoxifen for 2-3 years, and then Letrozole to complete out a total of 5 years    CHIEF COMPLIANT: follow-up of breast cancer  INTERVAL HISTORY: [name] is a 81 year old with above-mentioned history of right breast cancer diagnosed in 2005 treated with lumpectomy radiation and 5 years of antiestrogen therapy that was completed in 2011. She is been under routine surveillance. She is here for annual follow-up. She reports no new problems or concerns. She plans to undergo aortic valve replacement surgery next week.  REVIEW OF SYSTEMS:   Constitutional: Denies fevers, chills or abnormal weight loss Eyes: Denies blurriness of vision Ears, nose, mouth, throat, and face: Denies mucositis or sore throat Respiratory: Denies cough, dyspnea or wheezes Cardiovascular: Denies palpitation, chest discomfort or lower extremity swelling Gastrointestinal:  Denies nausea, heartburn or change in bowel habits Skin: Denies abnormal skin rashes Lymphatics: Denies new lymphadenopathy or easy bruising Neurological:Denies numbness, tingling or new weaknesses Behavioral/Psych: Mood is stable, no new changes  Breast:  denies any pain  or lumps or nodules in either breasts All other systems were reviewed with the patient and are negative.  I have reviewed the past medical history, past surgical history, social history and family history with the patient and they are unchanged from previous note.  ALLERGIES:  is allergic to amoxicillin-pot clavulanate; penicillins; and sulfonamide derivatives.  MEDICATIONS:  Current Outpatient Prescriptions  Medication Sig Dispense Refill  . ALPRAZolam (XANAX) 0.5 MG tablet Take 1 tablet (0.5 mg total) by mouth at bedtime. May take additional dose as needed for stress or abdominal pain related to IBS. 120 tablet 2  . CALCIUM CITRATE PO Take 500 mg by mouth daily.    . chloral hydrate (SOMNOTE) 500 MG capsule Take 1 capsule (500 mg total) by mouth at bedtime. Liquid formula: take 10 mL by mouth at bedtime as needed for sleep. 270 capsule 3  . Cholecalciferol (VITAMIN D-3) 1000 UNITS CAPS Take 1 capsule by mouth daily.     . fluticasone (FLONASE) 50 MCG/ACT nasal spray Place 1 spray into both nostrils as needed for allergies.     Marland Kitchen HYDROcodone-homatropine (HYCODAN) 5-1.5 MG/5ML syrup Take 5 mLs by mouth every 8 (eight) hours as needed for cough. 120 mL 0  . LORazepam (ATIVAN) 0.5 MG tablet Take 0.5 mg by mouth as needed for anxiety.    . Magnesium 400 MG CAPS Take 400 mg by mouth daily.     Vladimir Faster Glycol-Propyl Glycol (SYSTANE ULTRA PF) 0.4-0.3 % SOLN Apply 1 drop to eye 2 (two) times daily.    . Red Yeast Rice Extract ([employer] RED YEAST RICE PO) Take 600 mg by mouth daily.      No current facility-administered medications for this visit.    PHYSICAL EXAMINATION: ECOG  PERFORMANCE STATUS: 0 - Asymptomatic  Filed Vitals:   11/07/14 1201  BP: 120/62  Pulse: 60  Temp: 98.1 F (36.7 C)  Resp: 18   Filed Weights   11/07/14 1201  Weight: 158 lb 8 oz (71.895 kg)    GENERAL:alert, no distress and comfortable SKIN: skin color, texture, turgor are normal, no rashes or significant  lesions EYES: normal, Conjunctiva are pink and non-injected, sclera clear OROPHARYNX:no exudate, no erythema and lips, buccal mucosa, and tongue normal  NECK: supple, thyroid normal size, non-tender, without nodularity LYMPH:  no palpable lymphadenopathy in the cervical, axillary or inguinal LUNGS: clear to auscultation and percussion with normal breathing effort HEART: regular rate & rhythm and no murmurs and no lower extremity edema ABDOMEN:abdomen soft, non-tender and normal bowel sounds Musculoskeletal:no cyanosis of digits and no clubbing  NEURO: alert & oriented x 3 with fluent speech, no focal motor/sensory deficits BREAST: No palpable masses or nodules in either right or left breasts. No palpable axillary supraclavicular or infraclavicular adenopathy no breast tenderness or nipple discharge. (exam performed in the presence of a chaperone)  LABORATORY DATA:  I have reviewed the data as listed   Chemistry      Component Value Date/Time   NA 135* 10/31/2014 1430   NA 132* 10/10/2014 1157   K 4.3 10/31/2014 1430   K 4.0 10/10/2014 1157   CL 97 10/10/2014 1157   CO2 24 10/31/2014 1430   CO2 29 10/10/2014 1157   BUN 15.0 10/31/2014 1430   BUN 15 10/10/2014 1157   CREATININE 0.8 10/31/2014 1430   CREATININE 0.71 10/10/2014 1157      Component Value Date/Time   CALCIUM 9.0 10/31/2014 1430   CALCIUM 9.5 10/10/2014 1157   ALKPHOS 63 10/31/2014 1430   ALKPHOS 76 10/07/2011 1035   AST 25 10/31/2014 1430   AST 21 10/07/2011 1035   ALT 20 10/31/2014 1430   ALT 14 10/07/2011 1035   BILITOT 0.66 10/31/2014 1430   BILITOT 0.5 10/07/2011 1035       Lab Results  Component Value Date   WBC 8.0 10/31/2014   HGB 13.5 10/31/2014   HCT 40.7 10/31/2014   MCV 95.4 10/31/2014   PLT 190 10/31/2014   NEUTROABS 5.1 10/31/2014   ASSESSMENT & PLAN:  Breast cancer, right breast stage IA invasive lobular carcinoma of the right breast. ER positive, PR negative, HER-2/neu negative, with a  Ki-67 of 5%. She has underwent adjuvant radiation, and has completed 5 years of anti-estrogen therapy. She is current on observation with annual mammograms.  Breast Cancer Surveillance: 1. Breast exam 11/07/2014: Normal 2. Mammogram 02/10/2014 No abnormalities. Postsurgical changes. Breast Density Category C. I recommended that she get 3-D mammograms for surveillance. Discussed the differences between different breast density categories. Patient is planning to undergo aortic valve surgery next week. We will plan to delay her mammogram until November.  Up until now she was living 6 months in [place] in 6 months in [place]. She plans to sell her house in [place] and moved to [place].  Osteopenia: Bone density done in October 2014 revealed T score of -1.21-1.3. Very mild osteopenia. We will postpone the next bone density until 2017.  Return in one year for survivorship clinic. We will alternate follow-ups between survivorship clinic and myself once a year.  No orders of the defined types were placed in this encounter.   The patient has a good understanding of the overall plan. she agrees with it. she will call with any  problems that may develop before the next visit here.   Rulon Eisenmenger, MD

## 2014-11-07 NOTE — Assessment & Plan Note (Signed)
stage IA invasive lobular carcinoma of the right breast. ER positive, PR negative, HER-2/neu negative, with a Ki-67 of 5%. She has underwent adjuvant radiation, and has completed 5 years of anti-estrogen therapy. She is current on observation with annual mammograms.  Breast Cancer Surveillance: 1. Breast exam 11/07/2014: Normal 2. Mammogram 02/10/2014 No abnormalities. Postsurgical changes. Breast Density Category C. I recommended that she get 3-D mammograms for surveillance. Discussed the differences between different breast density categories.  Return in one year for survivorship clinic    

## 2014-11-07 NOTE — Telephone Encounter (Signed)
Gave avs. Sent message to schedule BSNP one year

## 2014-11-09 ENCOUNTER — Ambulatory Visit: Payer: Medicare Other | Admitting: Hematology and Oncology

## 2014-11-14 ENCOUNTER — Encounter (HOSPITAL_COMMUNITY): Payer: Medicare Other

## 2014-11-14 ENCOUNTER — Ambulatory Visit (HOSPITAL_COMMUNITY)
Admission: RE | Admit: 2014-11-14 | Discharge: 2014-11-14 | Disposition: A | Discharge status: Home / Self Care | Payer: Medicare Other | Source: Ambulatory Visit | Attending: Surgery | Admitting: Surgery

## 2014-11-14 ENCOUNTER — Encounter (HOSPITAL_COMMUNITY): Payer: Self-pay

## 2014-11-14 ENCOUNTER — Encounter (HOSPITAL_COMMUNITY)
Admission: RE | Admit: 2014-11-14 | Discharge: 2014-11-14 | Disposition: A | Discharge status: Home / Self Care | Payer: Medicare Other | Source: Ambulatory Visit | Attending: Surgery | Admitting: Surgery

## 2014-11-14 ENCOUNTER — Inpatient Hospital Stay (HOSPITAL_COMMUNITY)
Admission: RE | Admit: 2014-11-14 | Discharge: 2014-11-21 | DRG: 220 | Disposition: A | Discharge status: Home / Self Care | Payer: Medicare Other | Source: Ambulatory Visit | Attending: Surgery | Admitting: Surgery

## 2014-11-14 VITALS — BP 113/76 | HR 55 | Temp 97.5°F | Resp 18 | Ht 65.0 in | Wt 155.7 lb

## 2014-11-14 DIAGNOSIS — Z880 Allergy status to penicillin: Secondary | ICD-10-CM | POA: Diagnosis not present

## 2014-11-14 DIAGNOSIS — J9811 Atelectasis: Secondary | ICD-10-CM | POA: Diagnosis not present

## 2014-11-14 DIAGNOSIS — K5900 Constipation, unspecified: Secondary | ICD-10-CM | POA: Diagnosis present

## 2014-11-14 DIAGNOSIS — Z87891 Personal history of nicotine dependence: Secondary | ICD-10-CM

## 2014-11-14 DIAGNOSIS — M8580 Other specified disorders of bone density and structure, unspecified site: Secondary | ICD-10-CM | POA: Diagnosis present

## 2014-11-14 DIAGNOSIS — D62 Acute posthemorrhagic anemia: Secondary | ICD-10-CM | POA: Diagnosis not present

## 2014-11-14 DIAGNOSIS — I350 Nonrheumatic aortic (valve) stenosis: Principal | ICD-10-CM | POA: Diagnosis present

## 2014-11-14 DIAGNOSIS — E8770 Fluid overload, unspecified: Secondary | ICD-10-CM | POA: Diagnosis present

## 2014-11-14 DIAGNOSIS — Z79899 Other long term (current) drug therapy: Secondary | ICD-10-CM | POA: Diagnosis not present

## 2014-11-14 DIAGNOSIS — Z882 Allergy status to sulfonamides status: Secondary | ICD-10-CM | POA: Diagnosis not present

## 2014-11-14 DIAGNOSIS — I4891 Unspecified atrial fibrillation: Secondary | ICD-10-CM | POA: Diagnosis present

## 2014-11-14 DIAGNOSIS — R0989 Other specified symptoms and signs involving the circulatory and respiratory systems: Secondary | ICD-10-CM | POA: Diagnosis not present

## 2014-11-14 DIAGNOSIS — E780 Pure hypercholesterolemia: Secondary | ICD-10-CM | POA: Diagnosis present

## 2014-11-14 DIAGNOSIS — Z01818 Encounter for other preprocedural examination: Secondary | ICD-10-CM

## 2014-11-14 DIAGNOSIS — D696 Thrombocytopenia, unspecified: Secondary | ICD-10-CM | POA: Diagnosis present

## 2014-11-14 DIAGNOSIS — R55 Syncope and collapse: Secondary | ICD-10-CM | POA: Diagnosis present

## 2014-11-14 DIAGNOSIS — I358 Other nonrheumatic aortic valve disorders: Secondary | ICD-10-CM | POA: Diagnosis not present

## 2014-11-14 DIAGNOSIS — I517 Cardiomegaly: Secondary | ICD-10-CM | POA: Diagnosis not present

## 2014-11-14 DIAGNOSIS — I459 Conduction disorder, unspecified: Secondary | ICD-10-CM | POA: Diagnosis present

## 2014-11-14 DIAGNOSIS — Z8711 Personal history of peptic ulcer disease: Secondary | ICD-10-CM

## 2014-11-14 DIAGNOSIS — C50919 Malignant neoplasm of unspecified site of unspecified female breast: Secondary | ICD-10-CM | POA: Diagnosis not present

## 2014-11-14 DIAGNOSIS — Z952 Presence of prosthetic heart valve: Secondary | ICD-10-CM | POA: Diagnosis not present

## 2014-11-14 DIAGNOSIS — Z853 Personal history of malignant neoplasm of breast: Secondary | ICD-10-CM

## 2014-11-14 DIAGNOSIS — J90 Pleural effusion, not elsewhere classified: Secondary | ICD-10-CM | POA: Diagnosis not present

## 2014-11-14 HISTORY — DX: Hypoglycemia, unspecified: E16.2

## 2014-11-14 LAB — PULMONARY FUNCTION TEST
DL/VA % PRED: 68 %
DL/VA: 3.23 ml/min/mmHg/L
DLCO unc % pred: 59 %
DLCO unc: 13.61 ml/min/mmHg
FEF 25-75 Post: 1.17 L/sec
FEF 25-75 Pre: 0.83 L/sec
FEF2575-%Change-Post: 40 %
FEF2575-%PRED-POST: 86 %
FEF2575-%Pred-Pre: 61 %
FEV1-%CHANGE-POST: 4 %
FEV1-%PRED-POST: 100 %
FEV1-%Pred-Pre: 96 %
FEV1-POST: 1.87 L
FEV1-PRE: 1.8 L
FEV1FVC-%Change-Post: 0 %
FEV1FVC-%PRED-PRE: 91 %
FEV6-%Change-Post: 6 %
FEV6-%PRED-POST: 115 %
FEV6-%Pred-Pre: 109 %
FEV6-POST: 2.73 L
FEV6-PRE: 2.57 L
FEV6FVC-%Change-Post: 1 %
FEV6FVC-%Pred-Post: 104 %
FEV6FVC-%Pred-Pre: 103 %
FVC-%Change-Post: 4 %
FVC-%PRED-POST: 110 %
FVC-%Pred-Pre: 105 %
FVC-Post: 2.77 L
FVC-Pre: 2.65 L
Post FEV1/FVC ratio: 68 %
Post FEV6/FVC ratio: 99 %
Pre FEV1/FVC ratio: 68 %
Pre FEV6/FVC Ratio: 97 %
RV % pred: 103 %
RV: 2.41 L
TLC % pred: 100 %
TLC: 4.94 L

## 2014-11-14 LAB — URINE MICROSCOPIC-ADD ON

## 2014-11-14 LAB — COMPREHENSIVE METABOLIC PANEL
ALT: 16 U/L (ref 14–54)
AST: 28 U/L (ref 15–41)
Albumin: 4 g/dL (ref 3.5–5.0)
Alkaline Phosphatase: 70 U/L (ref 38–126)
Anion gap: 11 (ref 5–15)
BUN: 13 mg/dL (ref 6–20)
CO2: 19 mmol/L — ABNORMAL LOW (ref 22–32)
Calcium: 8.6 mg/dL — ABNORMAL LOW (ref 8.9–10.3)
Chloride: 100 mmol/L — ABNORMAL LOW (ref 101–111)
Creatinine, Ser: 0.74 mg/dL (ref 0.44–1.00)
GFR calc Af Amer: >60 mL/min (ref >60)
Glucose, Bld: 201 mg/dL — ABNORMAL HIGH (ref 65–99)
Potassium: 3.7 mmol/L (ref 3.5–5.1)
Sodium: 130 mmol/L — ABNORMAL LOW (ref 135–145)
TOTAL PROTEIN: 6.2 g/dL — AB (ref 6.5–8.1)
Total Bilirubin: 0.8 mg/dL (ref 0.3–1.2)

## 2014-11-14 LAB — BLOOD GAS, ARTERIAL
Acid-base deficit: 2.3 mmol/L — ABNORMAL HIGH (ref 0.0–2.0)
BICARBONATE: 21.1 meq/L (ref 20.0–24.0)
Drawn by: 42180
FIO2: 0.21 %
O2 Saturation: 97.5 %
PH ART: 7.449 (ref 7.350–7.450)
Patient temperature: 98.6
TCO2: 22 mmol/L (ref 0–100)
pCO2 arterial: 30.9 mmHg — ABNORMAL LOW (ref 35.0–45.0)
pO2, Arterial: 103 mmHg — ABNORMAL HIGH (ref 80.0–100.0)

## 2014-11-14 LAB — TYPE AND SCREEN
ABO/RH(D): B POS
ANTIBODY SCREEN: NEGATIVE

## 2014-11-14 LAB — CBC
HEMATOCRIT: 38.8 % (ref 36.0–46.0)
HEMOGLOBIN: 13.2 g/dL (ref 12.0–15.0)
MCH: 31.7 pg (ref 26.0–34.0)
MCHC: 34 g/dL (ref 30.0–36.0)
MCV: 93 fL (ref 78.0–100.0)
Platelets: 169 10*3/uL (ref 150–400)
RBC: 4.17 MIL/uL (ref 3.87–5.11)
RDW: 13 % (ref 11.5–15.5)
WBC: 6.9 10*3/uL (ref 4.0–10.5)

## 2014-11-14 LAB — URINALYSIS, ROUTINE W REFLEX MICROSCOPIC
Bilirubin Urine: NEGATIVE
Glucose, UA: NEGATIVE mg/dL
KETONES UR: NEGATIVE mg/dL
Leukocytes, UA: NEGATIVE
NITRITE: NEGATIVE
PH: 6 (ref 5.0–8.0)
Protein, ur: NEGATIVE mg/dL
Specific Gravity, Urine: 1.007 (ref 1.005–1.030)
Urobilinogen, UA: 0.2 mg/dL (ref 0.0–1.0)

## 2014-11-14 LAB — APTT: aPTT: 28 seconds (ref 24–37)

## 2014-11-14 LAB — ABO/RH: ABO/RH(D): B POS

## 2014-11-14 LAB — PROTIME-INR
INR: 1.07 (ref 0.00–1.49)
PROTHROMBIN TIME: 14.1 s (ref 11.6–15.2)

## 2014-11-14 LAB — SURGICAL PCR SCREEN
MRSA, PCR: NEGATIVE
Staphylococcus aureus: NEGATIVE

## 2014-11-14 MED ORDER — ALBUTEROL SULFATE (2.5 MG/3ML) 0.083% IN NEBU
2.5000 mg | INHALATION_SOLUTION | Freq: Once | RESPIRATORY_TRACT | Status: AC
  Administered 2014-11-14: 2.5 mg via RESPIRATORY_TRACT

## 2014-11-14 NOTE — Progress Notes (Signed)
Pre-op Cardiac Surgery  Carotid Findings:  Findings suggest 1-39% internal carotid artery stenosis bilaterally. Vertebral arteries are patent with antegrade flow.  Upper Extremity Right Left  Brachial Pressures Triphasic-Unable to obtain pressure due to restricted extremity* 121-Triphasic  Radial Waveforms Biphasic Biphasic  Ulnar Waveforms Biphasic Biphasic  Palmar Arch (Allen's Test) Signal is not affected with radial compression, decreases >50% with ulnar compression. Signal is not affected with radial compression, decreases >50% with ulnar compression.   *Patient requested that I put a pink "Restricted Extremity" band on her right arm due to her history of breast cancer. The patient has explained that her previous physician had suggested that the right arm not be used for blood pressures, IVs, or blood draws, however if necessary it can be used.   11/14/2014 1:47 PM [name], RVT, RDCS, RDMS

## 2014-11-14 NOTE — Pre-Procedure Instructions (Signed)
[name]  11/14/2014      [facility] # [address], [place] [address] Phone: [phone] Fax: [phone]    Your procedure is scheduled on Thursday, July 21.  Report to [facility] Admitting at 5:30 AM    Call this number if you have problems the morning of surgery:  [phone]   Remember:  Do not eat food or drink liquids after midnight. Wednesday night  Take these medicines the morning of surgery with A SIP OF WATER : None   Do not wear jewelry, make-up or nail polish.  Do not wear lotions, powders, or perfumes.    Do not shave 48 hours prior to surgery.    Do not bring valuables to the hospital.  [facility] is not responsible for any belongings or valuables.  Contacts, dentures or bridgework may not be worn into surgery.  Leave your suitcase in the car.  After surgery it may be brought to your room.  For patients admitted to the hospital, discharge time will be determined by your treatment team.    Special instructions:  [facility] - Preparing for Surgery  Before surgery, you can play an important role.  Because skin is not sterile, your skin needs to be as free of germs as possible.  You can reduce the number of germs on you skin by washing with CHG (chlorahexidine gluconate) soap before surgery.  CHG is an antiseptic cleaner which kills germs and bonds with the skin to continue killing germs even after washing.  Please DO NOT use if you have an allergy to CHG or antibacterial soaps.  If your skin becomes reddened/irritated stop using the CHG and inform your nurse when you arrive at Short Stay.  Do not shave (including legs and underarms) for at least 48 hours prior to the first CHG shower.  You may shave your face.  Please follow these instructions carefully:   1.  Shower with CHG Soap the night before surgery and the     morning of Surgery.  2.  If you choose to wash your hair,  wash your hair first as usual with your   normal shampoo.  3.  After you shampoo, rinse your hair and body thoroughly to remove the    Shampoo.  4.  Use CHG as you would any other liquid soap.  You can apply chg directly   to the skin and wash gently with scrungie or a clean washcloth.  5.  Apply the CHG Soap to your body ONLY FROM THE NECK DOWN.    Do not use on open wounds or open sores.  Avoid contact with your eyes,    ears, mouth and genitals (private parts).  Wash genitals (private parts)  with your normal soap.  6.  Wash thoroughly, paying special attention to the area where your surgery    will be performed.  7.  Thoroughly rinse your body with warm water from the neck down.  8.  DO NOT shower/wash with your normal soap after using and rinsing off  the CHG Soap.  9.  Pat yourself dry with a clean towel.            10.  Wear clean pajamas.            11.  Place clean sheets on your bed the night of your first shower and do not   sleep with  pets.  Day of Surgery  Do not apply any lotions/deoderants the morning of surgery.  Please wear clean clothes to the hospital/surgery center.    Please read over the following fact sheets that you were given. Pain Booklet, Coughing and Deep Breathing, Blood Transfusion Information, Open Heart Packet, MRSA Information and Surgical Site Infection Prevention

## 2014-11-14 NOTE — Progress Notes (Signed)
   11/14/14 [facility]  Have you ever been diagnosed with sleep apnea through a sleep study? No  Do you snore loudly (loud enough to be heard through closed doors)?  0  Do you often feel tired, fatigued, or sleepy during the daytime? 0  Has anyone observed you stop breathing during your sleep? 0  Do you have, or are you being treated for high blood pressure? 0  BMI more than 35 kg/m2? 0  Age over 79 years old? 1  Neck circumference greater than 40 cm/16 inches? 0  Gender: 0

## 2014-11-15 LAB — HEMOGLOBIN A1C
Hgb A1c MFr Bld: 5.8 % — ABNORMAL HIGH (ref 4.8–5.6)
Mean Plasma Glucose: 120 mg/dL

## 2014-11-15 MED ORDER — SODIUM CHLORIDE 0.9 % IV SOLN
INTRAVENOUS | Status: AC
  Administered 2014-11-16: 1 [IU]/h via INTRAVENOUS
  Administered 2014-11-16: 1.5 [IU]/h via INTRAVENOUS
  Filled 2014-11-15: qty 2.5

## 2014-11-15 MED ORDER — LEVOFLOXACIN IN D5W 500 MG/100ML IV SOLN
500.0000 mg | INTRAVENOUS | Status: AC
  Administered 2014-11-16: 500 mg via INTRAVENOUS
  Filled 2014-11-15: qty 100

## 2014-11-15 MED ORDER — PHENYLEPHRINE HCL 10 MG/ML IJ SOLN
30.0000 ug/min | INTRAVENOUS | Status: AC
  Administered 2014-11-16: 10 ug/min via INTRAVENOUS
  Filled 2014-11-15: qty 2

## 2014-11-15 MED ORDER — EPINEPHRINE HCL 1 MG/ML IJ SOLN
0.0000 ug/min | INTRAVENOUS | Status: DC
  Filled 2014-11-15: qty 4

## 2014-11-15 MED ORDER — METOPROLOL TARTRATE 12.5 MG HALF TABLET
12.5000 mg | ORAL_TABLET | ORAL | Status: DC
  Filled 2014-11-15: qty 1

## 2014-11-15 MED ORDER — PAPAVERINE HCL 30 MG/ML IJ SOLN
INTRAMUSCULAR | Status: DC
  Filled 2014-11-15: qty 2.5

## 2014-11-15 MED ORDER — DOPAMINE-DEXTROSE 3.2-5 MG/ML-% IV SOLN
0.0000 ug/kg/min | INTRAVENOUS | Status: DC
  Filled 2014-11-15: qty 250

## 2014-11-15 MED ORDER — MAGNESIUM SULFATE 50 % IJ SOLN
40.0000 meq | INTRAMUSCULAR | Status: DC
  Filled 2014-11-15: qty 10

## 2014-11-15 MED ORDER — SODIUM CHLORIDE 0.9 % IV SOLN
INTRAVENOUS | Status: AC
  Administered 2014-11-16: 69.8 mL/h via INTRAVENOUS
  Filled 2014-11-15: qty 40

## 2014-11-15 MED ORDER — DEXMEDETOMIDINE HCL IN NACL 400 MCG/100ML IV SOLN
0.1000 ug/kg/h | INTRAVENOUS | Status: AC
  Administered 2014-11-16: .2 ug/kg/h via INTRAVENOUS
  Filled 2014-11-15: qty 100

## 2014-11-15 MED ORDER — VANCOMYCIN HCL 10 G IV SOLR
1250.0000 mg | INTRAVENOUS | Status: AC
  Administered 2014-11-16: 1250 mg via INTRAVENOUS
  Filled 2014-11-15: qty 1250

## 2014-11-15 MED ORDER — POTASSIUM CHLORIDE 2 MEQ/ML IV SOLN
80.0000 meq | INTRAVENOUS | Status: DC
  Filled 2014-11-15: qty 40

## 2014-11-15 MED ORDER — NITROGLYCERIN IN D5W 200-5 MCG/ML-% IV SOLN
2.0000 ug/min | INTRAVENOUS | Status: AC
  Administered 2014-11-16: 5 ug/min via INTRAVENOUS
  Filled 2014-11-15: qty 250

## 2014-11-15 MED ORDER — SODIUM CHLORIDE 0.9 % IV SOLN
INTRAVENOUS | Status: DC
  Filled 2014-11-15: qty 30

## 2014-11-15 NOTE — H&P (Signed)
[name]       [name] [address]             [phone]      Cardiothoracic Surgery Admission History and Physical   PCP is Gwendolyn Grant, MD Referring Provider is [name], MD  Chief Complaint  Patient presents with  . Aortic Stenosis    severe...eval for surgery.[name] 10/10/14, CATH 09/17/14    HPI:  The patient is a 81 year old woman with a history of known severe aortic stenosis with suspected bicuspid aortic valve disease. I initially saw her in August 2015 and her echo on 09/26/2013 showed a mean gradient of 49 and a peak of 64 with an AVA of 0.54 by VTI and 0.61 by Vmax. Her aorta is normal sized. The mean gradient 1 year prior was 47 mm Hg. Left ventricular function remained normal. The patient was asymptomatic. She spends the winters in [place] and remains active playing doubles tennis without any fatigue, shortness of breath, chest pain or dizziness. She did have one episode of near syncope after eating lunch one day with dizziness and nausea and noted to be pale and diaphoretic with bradycardia. She was hospitalized and observed and no abnormality was found. It was felt to be vagal. She has not had any further episodes. She was not felt to be a TAVR candidate with a bicuspid valve and a low operative mortality and did not want to have open surgical AVR since she was asymptomatic. Over the past year she has continued to remain active playing tennis and denies any symptoms with that. She says that the only time she has shortness of breath and fatigue is when walking up hills. She has had no chest pain or shortness of breath. A recent echo shows that her mean gradient has increased to 60 mm Hg with a DI of 0.18, AVA of 0.51 cm 2.  Past Medical History  Diagnosis Date  . Hypercholesteremia   . Osteopenia   . Ulcer 2012    bleeding gastric Ulcer  . Breast cancer     right  . Aortic valve stenosis, severe     . Hiatal hernia     Past Surgical History  Procedure Laterality Date  . Breast surgery    . Tonsillectomy    . Parathyroidectomy    . Cosmetic surgery      face  . Cardiac catheterization N/A 10/18/2014    Procedure: Right/Left Heart Cath and Coronary Angiography; Surgeon: [name], MD; Location: [facility]; Service: Cardiovascular; Laterality: N/A;    Family History  Problem Relation Age of Onset  . Hypertension Mother   . Rectal cancer Mother   . Kidney disease Father     Social History History  Substance Use Topics  . Smoking status: Former Research scientist (life sciences)  . Smokeless tobacco: Never Used  . Alcohol Use: 4.2 oz/week    7 Glasses of wine per week    Current Outpatient Prescriptions  Medication Sig Dispense Refill  . ALPRAZolam (XANAX) 0.5 MG tablet Take 1 tablet (0.5 mg total) by mouth at bedtime. May take additional dose as needed for stress or abdominal pain related to IBS. 120 tablet 2  . CALCIUM CITRATE PO Take 500 mg by mouth daily.    . chloral hydrate (SOMNOTE) 500 MG capsule Take 500 mg by mouth at bedtime.    . Cholecalciferol (VITAMIN D-3) 1000 UNITS CAPS Take 1 capsule by mouth daily.     Marland Kitchen  fluticasone (FLONASE) 50 MCG/ACT nasal spray Place 1 spray into both nostrils as needed for allergies.     Marland Kitchen HYDROcodone-homatropine (HYCODAN) 5-1.5 MG/5ML syrup Take 5 mLs by mouth every 8 (eight) hours as needed for cough. 120 mL 0  . LORazepam (ATIVAN) 0.5 MG tablet Take 0.5 mg by mouth as needed for anxiety.    . Magnesium 400 MG CAPS Take 400 mg by mouth daily.     Vladimir Faster Glycol-Propyl Glycol (SYSTANE ULTRA PF) 0.4-0.3 % SOLN Apply 1 drop to eye 2 (two) times daily.    . Red Yeast Rice Extract ([employer] RED YEAST RICE PO) Take 600 mg by mouth daily.      No current facility-administered medications for this visit.    Allergies   Allergen Reactions  . Amoxicillin-Pot Clavulanate     REACTION: hives  . Penicillins Hives  . Sulfonamide Derivatives     REACTION: hives    Review of Systems  Constitutional: Negative for activity change and appetite change.   Fatigue only with walking up hills  HENT: Negative.  Eyes: Negative.  Respiratory: Positive for shortness of breath. Negative for cough and chest tightness.   Shortness of breath is only with walking up hills  Cardiovascular: Negative for chest pain, palpitations and leg swelling.  Gastrointestinal: Negative.  Endocrine: Negative.  Genitourinary: Negative.  Musculoskeletal: Negative.  Skin: Negative.  Allergic/Immunologic: Negative.  Neurological: Negative. Negative for dizziness and syncope.  Hematological: Negative.  Psychiatric/Behavioral: Negative.    BP 122/78 mmHg  Pulse 62  Resp 16  Ht '5\' 4"'$  (1.626 m)  Wt 151 lb (68.493 kg)  BMI 25.91 kg/m2  SpO2 98% Physical Exam  Constitutional: She is oriented to person, place, and time. She appears well-developed and well-nourished. No distress.  HENT:  Head: Normocephalic and atraumatic.  Mouth/Throat: Oropharynx is clear and moist.  Eyes: EOM are normal.  Neck: Normal range of motion. Neck supple. No JVD present. No thyromegaly present.  Cardiovascular: Normal rate, regular rhythm and intact distal pulses.  Murmur heard. 3/6 crescendo/decrescendo murmur along RSB. No diastolic murmur  Pulmonary/Chest: Effort normal and breath sounds normal. No respiratory distress. She has no rales.  Abdominal: Soft. Bowel sounds are normal. She exhibits no distension and no mass. There is no tenderness.  Musculoskeletal: Normal range of motion. She exhibits no edema.  Neurological: She is alert and oriented to person, place, and time. She has normal strength. No cranial nerve deficit or sensory deficit.  Skin: Skin is warm and dry.  Psychiatric: She has a normal mood  and affect.     Diagnostic Tests:              [facility]            [address], [place] [address]              [phone]  ------------------------------------------------------------------- Transthoracic Echocardiography  Patient:  [name], [name] MR #:    [id number] Study Date: 10/10/2014 Gender:   F Age:    82 Height:   162.6 cm Weight:   68.5 kg BSA:    1.77 m^2 Pt. Status: Room:  ATTENDING  Default, Provider [phone] [place] SONOGRAPHER Cindy Hazy, RDCS PERFORMING  [facility], Outpatient  cc:  ------------------------------------------------------------------- LV EF: 65% -  70%  ------------------------------------------------------------------- Indications:   I35.0 Aortic stenosis.  ------------------------------------------------------------------- History:  PMH: Acquired  from the patient and from the patient&'s chart. Risk factors:  Hypercholesterolemia.  ------------------------------------------------------------------- Study Conclusions  - Left ventricle: The cavity size was normal. There was moderate concentric hypertrophy. Systolic function was vigorous. The estimated ejection fraction was in the range of 65% to 70%. Wall motion was normal; there were no regional wall motion abnormalities. Doppler parameters are consistent with abnormal left ventricular relaxation (grade 1 diastolic dysfunction). Doppler parameters are consistent with elevated ventricular end-diastolic filling pressure. - Aortic valve: Bicuspid; moderately thickened, severely calcified leaflets. Valve mobility was restricted. There was severe stenosis. There was mild regurgitation. - Mitral valve: There was mild regurgitation. - Right ventricle: The cavity size was normal. Wall thickness  was normal. Systolic function was normal. - Right atrium: The atrium was normal in size. - Tricuspid valve: There was mild regurgitation. - Pulmonic valve: There was mild regurgitation. - Pulmonary arteries: Systolic pressure was within the normal range. PA peak pressure: 33 mm Hg (S). - Inferior vena cava: The vessel was normal in size. - Pericardium, extracardiac: There was no pericardial effusion.  Impressions:  - Bicuspid aortic valve with severe aortic stenosis and mild aortic regurgitation.  Transthoracic echocardiography. M-mode, complete 2D, spectral Doppler, and color Doppler. Birthdate: Patient birthdate: March 21, 1933. Age: Patient is 81 yr old. Sex: Gender: female. BMI: 25.9 kg/m^2. Blood pressure:   118/80 Patient status: Outpatient. Study date: Study date: 10/10/2014. Study time: 09:50 AM. Location: [facility]  -------------------------------------------------------------------  ------------------------------------------------------------------- Left ventricle: The cavity size was normal. There was moderate concentric hypertrophy. Systolic function was vigorous. The estimated ejection fraction was in the range of 65% to 70%. Wall motion was normal; there were no regional wall motion abnormalities. Doppler parameters are consistent with abnormal left ventricular relaxation (grade 1 diastolic dysfunction). Doppler parameters are consistent with elevated ventricular end-diastolic filling pressure.  ------------------------------------------------------------------- Aortic valve:  Bicuspid; moderately thickened, severely calcified leaflets. Valve mobility was restricted. Doppler:  There was severe stenosis.  There was mild regurgitation.  VTI ratio of LVOT to aortic valve: 0.21. Valve area (VTI): 0.59 cm^2. Indexed valve area (VTI): 0.33 cm^2/m^2. Mean velocity ratio of LVOT to aortic valve: 0.18. Valve area (Vmean): 0.51 cm^2. Indexed  valve area (Vmean): 0.29 cm^2/m^2.  Mean gradient (S): 60 mm Hg.  ------------------------------------------------------------------- Aorta: Aortic root: The aortic root was normal in size. Ascending aorta: The ascending aorta was normal in size.  ------------------------------------------------------------------- Mitral valve:  Structurally normal valve.  Mobility was not restricted. Doppler: Transvalvular velocity was within the normal range. There was no evidence for stenosis. There was mild regurgitation.  Peak gradient (D): 2 mm Hg.  ------------------------------------------------------------------- Left atrium: The atrium was normal in size.  ------------------------------------------------------------------- Right ventricle: The cavity size was normal. Wall thickness was normal. Systolic function was normal.  ------------------------------------------------------------------- Pulmonic valve:  Structurally normal valve.  Cusp separation was normal. Doppler: Transvalvular velocity was within the normal range. There was no evidence for stenosis. There was mild regurgitation.  ------------------------------------------------------------------- Tricuspid valve:  Structurally normal valve.  Doppler: Transvalvular velocity was within the normal range. There was mild regurgitation.  ------------------------------------------------------------------- Pulmonary artery:  The main pulmonary artery was normal-sized. Systolic pressure was within the normal range.  ------------------------------------------------------------------- Right atrium: The atrium was normal in size.  ------------------------------------------------------------------- Pericardium: There was no pericardial effusion.  ------------------------------------------------------------------- Systemic veins: Inferior vena cava: The vessel was normal in  size.  ------------------------------------------------------------------- Measurements  Left ventricle              Value     Reference  LV ID, ED, PLAX chordal      (L)   36  mm    43 - 52 LV ID, ES, PLAX chordal      (L)   21  mm    23 - 38 LV fx shortening, PLAX chordal      42  %    >=29 LV PW thickness, ED            13  mm    --------- IVS/LV PW ratio, ED            1.15      <=1.3 Stroke volume, 2D             73  ml    --------- Stroke volume/bsa, 2D           41  ml/m^2  --------- LV e&', lateral              4.7  cm/s   --------- LV E/e&', lateral             15.28     --------- LV e&', medial               3.89 cm/s   --------- LV E/e&', medial              18.46     --------- LV e&', average              4.3  cm/s   --------- LV E/e&', average             16.72     ---------  Ventricular septum            Value     Reference IVS thickness, ED             15  mm    ---------  LVOT                   Value     Reference LVOT ID, S                19  mm    --------- LVOT area                 2.84 cm^2   --------- LVOT mean velocity, S           65.4 cm/s   --------- LVOT VTI, S                25.8 cm    ---------  Aortic valve               Value     Reference Aortic valve mean velocity, S       367  cm/s   --------- Aortic valve VTI, S            124  cm    --------- Aortic mean gradient, S          60  mm Hg  --------- VTI ratio, LVOT/AV            0.21      --------- Aortic valve area, VTI          0.59 cm^2    --------- Aortic valve area/bsa, VTI        0.33 cm^2/m^2 --------- Velocity ratio, mean, LVOT/AV       0.18      --------- Aortic valve area, mean velocity     0.51 cm^2   ---------  Aortic valve area/bsa, mean        0.29 cm^2/m^2 --------- velocity Aortic regurg pressure half-time     710  ms    ---------  Aorta                   Value     Reference Aortic root ID, ED            31  mm    ---------  Left atrium                Value     Reference LA ID, A-P, ES              36  mm    --------- LA ID/bsa, A-P              2.03 cm/m^2  <=2.2 LA volume, S               37.5 ml    --------- LA volume/bsa, S             21.2 ml/m^2  --------- LA volume, ES, 1-p A4C          33.6 ml    --------- LA volume/bsa, ES, 1-p A4C        19  ml/m^2  --------- LA volume, ES, 1-p A2C          42.1 ml    --------- LA volume/bsa, ES, 1-p A2C        23.7 ml/m^2  ---------  Mitral valve               Value     Reference Mitral E-wave peak velocity        71.8 cm/s   --------- Mitral A-wave peak velocity        91.7 cm/s   --------- Mitral deceleration time     (H)   377  ms    150 - 230 Mitral peak gradient, D          2   mm Hg  --------- Mitral E/A ratio, peak          0.78      ---------  Pulmonary arteries            Value     Reference PA pressure, S, DP        (H)   33  mm Hg  <=30  Tricuspid valve              Value     Reference Tricuspid regurg peak velocity      271  cm/s   --------- Tricuspid peak RV-RA gradient       29  mm Hg  ---------  Right ventricle              Value      Reference RV s&', lateral, S             11.5 cm/s   ---------  Legend: (L) and (H) mark values outside specified reference range.  ------------------------------------------------------------------- Prepared and Electronically Authenticated by  Ena Dawley, M.D. 2016-06-14T13:20:47   INDICATION: 81 year old woman who is been followed for severe bicuspid aortic stenosis. She is relatively active and in good health. She has developed progressive severe aortic stenosis by serial echocardiography, now with a mean transaortic valve gradient of 60 mmHg. She is referred for cardiac catheterization in the setting of what now appears to be very severe aortic stenosis based on noninvasive hemodynamic  data.  PROCEDURAL DETAILS: The right groin was prepped, draped, and anesthetized with 1% lidocaine. Using the modified Seldinger technique a 4 French sheath was placed in the right femoral artery and a 7 French sheath was placed in the right femoral vein. A Swan-Ganz catheter was used for the right heart catheterization. Standard protocol was followed for recording of right heart pressures and sampling of oxygen saturations. Fick cardiac output was calculated. Standard Judkins catheters were used for selective coronary angiography. The aortic valve was not crossed. There were no immediate procedural complications. The patient was transferred to the post catheterization recovery area for further monitoring. There were no immediate complications during the procedure.    Conclusion    Final conclusions: 1. Widely patent coronary arteries without evidence of obstructive coronary artery disease 2. Bulky calcification of the aortic valve with restricted mobility based on fluoroscopic assessment 3. Essentially normal right heart pressures and normal cardiac output  Plan: refer to Dr Cyndia Bent for consideration of aortic valve replacement    Coronary Findings    Dominance:  Right   Left Main  The vessel is angiographically normal.     Left Anterior Descending  The vessel is angiographically normal.   . Second Diagonal Branch   The vessel is large in size.     Left Circumflex  The vessel is angiographically normal.     Right Coronary Artery  The vessel is angiographically normal.      Left Heart    Aortic Valve There is severe aortic valve stenosis. The aortic valve is calcified. There is restricted aortic valve motion. The aortic valve is congenitally bicuspid.    Coronary Diagrams    Diagnostic Diagram            Implants    Name ID Temporary Type Supply   No information to display    Hemo Data       Most Recent Value   Fick Cardiac Output  5.82 L/min   Fick Cardiac Output Index  3.33 (L/min)/BSA   RA A Wave  11 mmHg   RA V Wave  8 mmHg   RA Mean  6 mmHg   RV Systolic Pressure  42 mmHg   RV Diastolic Pressure  4 mmHg   RV EDP  12 mmHg   PA Systolic Pressure  42 mmHg   PA Diastolic Pressure  11 mmHg   PA Mean  23 mmHg   PW A Wave  14 mmHg   PW V Wave  13 mmHg   PW Mean  11 mmHg   AO Systolic Pressure  644 mmHg   AO Diastolic Pressure  73 mmHg   AO Mean  101 mmHg   QP/QS  0.87   TPVR Index  7.95 HRUI   TSVR Index  30.36 HRUI   PVR SVR Ratio  0.15   TPVR/TSVR Ratio  0.26    Order-Level Documents:    There are no order-level documents.    Encounter-Level Documents - 10/10/14:      Scan on 10/19/2014 10:10 AM by Provider Default, MDScan on 10/19/2014 10:10 AM by Provider Default, MD     Scan on 10/19/2014 10:07 AM by Provider Default, MDScan on 10/19/2014 10:07 AM by Provider Default, MD     Scan on 10/18/2014 10:46 AM by Provider Default, MDScan on 10/18/2014 10:46 AM by Provider Default, MD     Electronic signature on 10/18/2014  7:56 AM    Signed    Electronically signed by Legrand Como  Burt Knack, MD on 10/18/14 at 1057 EDT    Impression:  I have personally reviewed her echocardiograms and cardiac cath films. She has severe aortic stenosis with a bicuspid valve that is severely calcified and the mean gradient is now 57. She is minimally symptomatic with shortness of breath and fatigue only with walking up inclines. I agree with Dr. Burt Knack that the best treatment for her is AVR since her gradient has increased from 32 to 60 over the past year. I discussed the risks of continued observation including sudden decompensation with syncope, pulmonary edema or sudden death, and progressive LV dysfunction. She is not a TAVR candidate due to her bicuspid aortic valve and low operative mortality and I have explained this to her and her husband and daughter with her today and her other daughter who is a MSW in cardiology social work and is conferenced in on our visit today by their cell phone. I discussed the use of a tissue valve and the benefits and risk of that choice for her. She is in agreement with that. I discussed the operative procedure with the patient and family including alternatives, benefits and risks; including but not limited to bleeding, blood transfusion, infection, stroke, myocardial infarction, graft failure, heart block requiring a permanent pacemaker, organ dysfunction, and death. Wanona L Bogacz understands and agrees to proceed.   Plan:  AVR using a tissue valve on November 16, 2014.  Gaye Pollack, MD [facility] [phone]

## 2014-11-16 ENCOUNTER — Inpatient Hospital Stay (HOSPITAL_COMMUNITY): Payer: Medicare Other | Admitting: Anesthesiology

## 2014-11-16 ENCOUNTER — Inpatient Hospital Stay (HOSPITAL_COMMUNITY): Payer: Medicare Other

## 2014-11-16 ENCOUNTER — Encounter (HOSPITAL_COMMUNITY)
Admission: RE | Disposition: A | Discharge status: Home / Self Care | Payer: Medicare Other | Source: Ambulatory Visit | Attending: Surgery

## 2014-11-16 ENCOUNTER — Encounter (HOSPITAL_COMMUNITY): Payer: Self-pay | Admitting: *Deleted

## 2014-11-16 DIAGNOSIS — I350 Nonrheumatic aortic (valve) stenosis: Secondary | ICD-10-CM

## 2014-11-16 DIAGNOSIS — Z952 Presence of prosthetic heart valve: Secondary | ICD-10-CM

## 2014-11-16 HISTORY — PX: TEE WITHOUT CARDIOVERSION: SHX5443

## 2014-11-16 HISTORY — PX: AORTIC VALVE REPLACEMENT: SHX41

## 2014-11-16 LAB — POCT I-STAT 3, ART BLOOD GAS (G3+)
ACID-BASE DEFICIT: 1 mmol/L (ref 0.0–2.0)
ACID-BASE DEFICIT: 4 mmol/L — AB (ref 0.0–2.0)
Acid-base deficit: 6 mmol/L — ABNORMAL HIGH (ref 0.0–2.0)
Acid-base deficit: 4 mmol/L — ABNORMAL HIGH (ref 0.0–2.0)
BICARBONATE: 23.8 meq/L (ref 20.0–24.0)
BICARBONATE: 17.8 meq/L — AB (ref 20.0–24.0)
BICARBONATE: 20.8 meq/L (ref 20.0–24.0)
Bicarbonate: 20.8 mEq/L (ref 20.0–24.0)
O2 SAT: 100 %
O2 Saturation: 98 %
O2 Saturation: 100 %
O2 Saturation: 98 %
PCO2 ART: 34.3 mmHg — AB (ref 35.0–45.0)
PCO2 ART: 28.8 mmHg — AB (ref 35.0–45.0)
PCO2 ART: 36.7 mmHg (ref 35.0–45.0)
PO2 ART: 289 mmHg — AB (ref 80.0–100.0)
Patient temperature: 35.5
Patient temperature: 36.9
TCO2: 25 mmol/L (ref 0–100)
TCO2: 22 mmol/L (ref 0–100)
TCO2: 19 mmol/L (ref 0–100)
TCO2: 22 mmol/L (ref 0–100)
pCO2 arterial: 39.3 mmHg (ref 35.0–45.0)
pH, Arterial: 7.39 (ref 7.350–7.450)
pH, Arterial: 7.383 (ref 7.350–7.450)
pH, Arterial: 7.4 (ref 7.350–7.450)
pH, Arterial: 7.361 (ref 7.350–7.450)
pO2, Arterial: 105 mmHg — ABNORMAL HIGH (ref 80.0–100.0)
pO2, Arterial: 164 mmHg — ABNORMAL HIGH (ref 80.0–100.0)
pO2, Arterial: 104 mmHg — ABNORMAL HIGH (ref 80.0–100.0)

## 2014-11-16 LAB — POCT I-STAT, CHEM 8
BUN: 13 mg/dL (ref 6–20)
BUN: 12 mg/dL (ref 6–20)
BUN: 12 mg/dL (ref 6–20)
BUN: 11 mg/dL (ref 6–20)
BUN: 11 mg/dL (ref 6–20)
BUN: 10 mg/dL (ref 6–20)
CALCIUM ION: 1.18 mmol/L (ref 1.13–1.30)
CHLORIDE: 103 mmol/L (ref 101–111)
CHLORIDE: 106 mmol/L (ref 101–111)
CREATININE: 0.5 mg/dL (ref 0.44–1.00)
Calcium, Ion: 1.26 mmol/L (ref 1.13–1.30)
Calcium, Ion: 0.99 mmol/L — ABNORMAL LOW (ref 1.13–1.30)
Calcium, Ion: 0.98 mmol/L — ABNORMAL LOW (ref 1.13–1.30)
Calcium, Ion: 1.01 mmol/L — ABNORMAL LOW (ref 1.13–1.30)
Calcium, Ion: 1.24 mmol/L (ref 1.13–1.30)
Chloride: 104 mmol/L (ref 101–111)
Chloride: 105 mmol/L (ref 101–111)
Chloride: 97 mmol/L — ABNORMAL LOW (ref 101–111)
Chloride: 102 mmol/L (ref 101–111)
Creatinine, Ser: 0.4 mg/dL — ABNORMAL LOW (ref 0.44–1.00)
Creatinine, Ser: 0.5 mg/dL (ref 0.44–1.00)
Creatinine, Ser: 0.5 mg/dL (ref 0.44–1.00)
Creatinine, Ser: 0.4 mg/dL — ABNORMAL LOW (ref 0.44–1.00)
Creatinine, Ser: 0.6 mg/dL (ref 0.44–1.00)
GLUCOSE: 110 mg/dL — AB (ref 65–99)
GLUCOSE: 86 mg/dL (ref 65–99)
Glucose, Bld: 137 mg/dL — ABNORMAL HIGH (ref 65–99)
Glucose, Bld: 102 mg/dL — ABNORMAL HIGH (ref 65–99)
Glucose, Bld: 135 mg/dL — ABNORMAL HIGH (ref 65–99)
Glucose, Bld: 160 mg/dL — ABNORMAL HIGH (ref 65–99)
HCT: 31 % — ABNORMAL LOW (ref 36.0–46.0)
HCT: 26 % — ABNORMAL LOW (ref 36.0–46.0)
HEMATOCRIT: 35 % — AB (ref 36.0–46.0)
HEMATOCRIT: 24 % — AB (ref 36.0–46.0)
HEMATOCRIT: 24 % — AB (ref 36.0–46.0)
HEMATOCRIT: 22 % — AB (ref 36.0–46.0)
HEMOGLOBIN: 8.2 g/dL — AB (ref 12.0–15.0)
HEMOGLOBIN: 8.2 g/dL — AB (ref 12.0–15.0)
HEMOGLOBIN: 8.8 g/dL — AB (ref 12.0–15.0)
Hemoglobin: 11.9 g/dL — ABNORMAL LOW (ref 12.0–15.0)
Hemoglobin: 10.5 g/dL — ABNORMAL LOW (ref 12.0–15.0)
Hemoglobin: 7.5 g/dL — ABNORMAL LOW (ref 12.0–15.0)
POTASSIUM: 3.6 mmol/L (ref 3.5–5.1)
POTASSIUM: 3.5 mmol/L (ref 3.5–5.1)
POTASSIUM: 3.5 mmol/L (ref 3.5–5.1)
POTASSIUM: 3.9 mmol/L (ref 3.5–5.1)
Potassium: 4.1 mmol/L (ref 3.5–5.1)
Potassium: 4 mmol/L (ref 3.5–5.1)
SODIUM: 137 mmol/L (ref 135–145)
SODIUM: 144 mmol/L (ref 135–145)
Sodium: 139 mmol/L (ref 135–145)
Sodium: 133 mmol/L — ABNORMAL LOW (ref 135–145)
Sodium: 136 mmol/L (ref 135–145)
Sodium: 135 mmol/L (ref 135–145)
TCO2: 24 mmol/L (ref 0–100)
TCO2: 23 mmol/L (ref 0–100)
TCO2: 24 mmol/L (ref 0–100)
TCO2: 23 mmol/L (ref 0–100)
TCO2: 24 mmol/L (ref 0–100)
TCO2: 21 mmol/L (ref 0–100)

## 2014-11-16 LAB — CBC
HEMATOCRIT: 32.1 % — AB (ref 36.0–46.0)
HEMATOCRIT: 25.2 % — AB (ref 36.0–46.0)
Hemoglobin: 11.1 g/dL — ABNORMAL LOW (ref 12.0–15.0)
Hemoglobin: 8.6 g/dL — ABNORMAL LOW (ref 12.0–15.0)
MCH: 32.1 pg (ref 26.0–34.0)
MCH: 31.9 pg (ref 26.0–34.0)
MCHC: 34.6 g/dL (ref 30.0–36.0)
MCHC: 34.1 g/dL (ref 30.0–36.0)
MCV: 92.8 fL (ref 78.0–100.0)
MCV: 93.3 fL (ref 78.0–100.0)
PLATELETS: 129 10*3/uL — AB (ref 150–400)
Platelets: 114 10*3/uL — ABNORMAL LOW (ref 150–400)
RBC: 3.46 MIL/uL — ABNORMAL LOW (ref 3.87–5.11)
RBC: 2.7 MIL/uL — AB (ref 3.87–5.11)
RDW: 13.2 % (ref 11.5–15.5)
RDW: 13.4 % (ref 11.5–15.5)
WBC: 12.9 10*3/uL — ABNORMAL HIGH (ref 4.0–10.5)
WBC: 10.6 10*3/uL — ABNORMAL HIGH (ref 4.0–10.5)

## 2014-11-16 LAB — GLUCOSE, CAPILLARY
GLUCOSE-CAPILLARY: 69 mg/dL (ref 65–99)
GLUCOSE-CAPILLARY: 108 mg/dL — AB (ref 65–99)
GLUCOSE-CAPILLARY: 173 mg/dL — AB (ref 65–99)
GLUCOSE-CAPILLARY: 165 mg/dL — AB (ref 65–99)
GLUCOSE-CAPILLARY: 164 mg/dL — AB (ref 65–99)
Glucose-Capillary: 132 mg/dL — ABNORMAL HIGH (ref 65–99)
Glucose-Capillary: 162 mg/dL — ABNORMAL HIGH (ref 65–99)
Glucose-Capillary: 165 mg/dL — ABNORMAL HIGH (ref 65–99)

## 2014-11-16 LAB — POCT I-STAT 4, (NA,K, GLUC, HGB,HCT)
Glucose, Bld: 157 mg/dL — ABNORMAL HIGH (ref 65–99)
HCT: 33 % — ABNORMAL LOW (ref 36.0–46.0)
Hemoglobin: 11.2 g/dL — ABNORMAL LOW (ref 12.0–15.0)
POTASSIUM: 3.7 mmol/L (ref 3.5–5.1)
SODIUM: 139 mmol/L (ref 135–145)

## 2014-11-16 LAB — APTT: aPTT: 49 seconds — ABNORMAL HIGH (ref 24–37)

## 2014-11-16 LAB — CREATININE, SERUM
Creatinine, Ser: 0.64 mg/dL (ref 0.44–1.00)
GFR calc Af Amer: >60 mL/min (ref >60)

## 2014-11-16 LAB — MAGNESIUM: MAGNESIUM: 2.8 mg/dL — AB (ref 1.7–2.4)

## 2014-11-16 LAB — PROTIME-INR
INR: 1.73 — AB (ref 0.00–1.49)
PROTHROMBIN TIME: 20.2 s — AB (ref 11.6–15.2)

## 2014-11-16 SURGERY — REPLACEMENT, AORTIC VALVE, OPEN
Anesthesia: General | Site: Chest

## 2014-11-16 MED ORDER — STERILE WATER FOR INJECTION IJ SOLN
INTRAMUSCULAR | Status: AC
  Filled 2014-11-16: qty 10

## 2014-11-16 MED ORDER — MORPHINE SULFATE 2 MG/ML IJ SOLN
INTRAMUSCULAR | Status: AC
  Filled 2014-11-16: qty 1

## 2014-11-16 MED ORDER — LACTATED RINGERS IV SOLN
INTRAVENOUS | Status: DC | PRN
  Administered 2014-11-16 (×2): via INTRAVENOUS

## 2014-11-16 MED ORDER — ALBUMIN HUMAN 5 % IV SOLN
250.0000 mL | INTRAVENOUS | Status: AC | PRN
  Administered 2014-11-16 (×3): 250 mL via INTRAVENOUS

## 2014-11-16 MED ORDER — INSULIN REGULAR BOLUS VIA INFUSION
0.0000 [IU] | Freq: Three times a day (TID) | INTRAVENOUS | Status: DC
  Filled 2014-11-16: qty 10

## 2014-11-16 MED ORDER — PROTAMINE SULFATE 10 MG/ML IV SOLN
INTRAVENOUS | Status: AC
  Filled 2014-11-16: qty 25

## 2014-11-16 MED ORDER — ACETAMINOPHEN 650 MG RE SUPP
650.0000 mg | Freq: Once | RECTAL | Status: AC
  Administered 2014-11-16: 650 mg via RECTAL

## 2014-11-16 MED ORDER — SODIUM CHLORIDE 0.9 % IJ SOLN
INTRAMUSCULAR | Status: AC
  Filled 2014-11-16: qty 10

## 2014-11-16 MED ORDER — ALBUMIN HUMAN 5 % IV SOLN
INTRAVENOUS | Status: DC | PRN
  Administered 2014-11-16: 11:00:00 via INTRAVENOUS

## 2014-11-16 MED ORDER — SODIUM CHLORIDE 0.9 % IJ SOLN
3.0000 mL | Freq: Two times a day (BID) | INTRAMUSCULAR | Status: DC
  Administered 2014-11-17 – 2014-11-18 (×3): 3 mL via INTRAVENOUS

## 2014-11-16 MED ORDER — ACETAMINOPHEN 500 MG PO TABS
1000.0000 mg | ORAL_TABLET | Freq: Four times a day (QID) | ORAL | Status: DC
  Administered 2014-11-17 – 2014-11-18 (×3): 1000 mg via ORAL
  Filled 2014-11-16 (×9): qty 2

## 2014-11-16 MED ORDER — SODIUM CHLORIDE 0.9 % IV SOLN
INTRAVENOUS | Status: DC | PRN
  Administered 2014-11-16: 11:00:00 via INTRAVENOUS

## 2014-11-16 MED ORDER — FLUTICASONE PROPIONATE 50 MCG/ACT NA SUSP: 1.0000 | NASAL | Status: DC | PRN

## 2014-11-16 MED ORDER — METOPROLOL TARTRATE 25 MG/10 ML ORAL SUSPENSION
12.5000 mg | Freq: Two times a day (BID) | ORAL | Status: DC
  Filled 2014-11-16 (×3): qty 5

## 2014-11-16 MED ORDER — PROPOFOL 10 MG/ML IV BOLUS
INTRAVENOUS | Status: DC | PRN
  Administered 2014-11-16: 90 mg via INTRAVENOUS

## 2014-11-16 MED ORDER — LACTATED RINGERS IV SOLN: INTRAVENOUS | Status: DC

## 2014-11-16 MED ORDER — HEMOSTATIC AGENTS (NO CHARGE) OPTIME
TOPICAL | Status: DC | PRN
  Administered 2014-11-16: 1 via TOPICAL

## 2014-11-16 MED ORDER — ONDANSETRON HCL 4 MG/2ML IJ SOLN
4.0000 mg | Freq: Four times a day (QID) | INTRAMUSCULAR | Status: DC | PRN
  Administered 2014-11-16 – 2014-11-17 (×2): 4 mg via INTRAVENOUS
  Filled 2014-11-16 (×2): qty 2

## 2014-11-16 MED ORDER — ACETAMINOPHEN 160 MG/5ML PO SOLN
1000.0000 mg | Freq: Four times a day (QID) | ORAL | Status: DC
  Filled 2014-11-16: qty 40

## 2014-11-16 MED ORDER — ACETAMINOPHEN 160 MG/5ML PO SOLN: 650.0000 mg | Freq: Once | ORAL | Status: AC

## 2014-11-16 MED ORDER — HEPARIN SODIUM (PORCINE) 1000 UNIT/ML IJ SOLN
INTRAMUSCULAR | Status: DC | PRN
Start: 2014-11-16 — End: 2014-11-16
  Administered 2014-11-16: 24000 [IU] via INTRAVENOUS

## 2014-11-16 MED ORDER — MIDAZOLAM HCL 10 MG/2ML IJ SOLN
INTRAMUSCULAR | Status: AC
  Filled 2014-11-16: qty 2

## 2014-11-16 MED ORDER — BISACODYL 5 MG PO TBEC
10.0000 mg | DELAYED_RELEASE_TABLET | Freq: Every day | ORAL | Status: DC
  Administered 2014-11-17 – 2014-11-18 (×2): 10 mg via ORAL
  Filled 2014-11-16 (×2): qty 2

## 2014-11-16 MED ORDER — ARTIFICIAL TEARS OP OINT
TOPICAL_OINTMENT | OPHTHALMIC | Status: AC
  Filled 2014-11-16: qty 3.5

## 2014-11-16 MED ORDER — SODIUM CHLORIDE 0.9 % IV SOLN
INTRAVENOUS | Status: DC
  Administered 2014-11-16: 2.6 [IU]/h via INTRAVENOUS
  Filled 2014-11-16: qty 2.5

## 2014-11-16 MED ORDER — MORPHINE SULFATE 2 MG/ML IJ SOLN
INTRAMUSCULAR | Status: AC
  Administered 2014-11-16: 2 mg via INTRAVENOUS
  Filled 2014-11-16: qty 1

## 2014-11-16 MED ORDER — PANTOPRAZOLE SODIUM 40 MG PO TBEC
40.0000 mg | DELAYED_RELEASE_TABLET | Freq: Every day | ORAL | Status: DC
  Administered 2014-11-18: 40 mg via ORAL
  Filled 2014-11-16: qty 1

## 2014-11-16 MED ORDER — AMIODARONE HCL IN DEXTROSE 360-4.14 MG/200ML-% IV SOLN
60.0000 mg/h | INTRAVENOUS | Status: AC
  Administered 2014-11-16: 60 mg/h via INTRAVENOUS

## 2014-11-16 MED ORDER — METOPROLOL TARTRATE 12.5 MG HALF TABLET
12.5000 mg | ORAL_TABLET | Freq: Two times a day (BID) | ORAL | Status: DC
  Filled 2014-11-16 (×3): qty 1

## 2014-11-16 MED ORDER — DEXMEDETOMIDINE HCL IN NACL 200 MCG/50ML IV SOLN
0.0000 ug/kg/h | INTRAVENOUS | Status: DC
  Filled 2014-11-16: qty 50

## 2014-11-16 MED ORDER — MORPHINE SULFATE 2 MG/ML IJ SOLN
2.0000 mg | INTRAMUSCULAR | Status: DC | PRN
  Administered 2014-11-16: 2 mg via INTRAVENOUS
  Filled 2014-11-16: qty 1

## 2014-11-16 MED ORDER — 0.9 % SODIUM CHLORIDE (POUR BTL) OPTIME
TOPICAL | Status: DC | PRN
  Administered 2014-11-16: 1000 mL

## 2014-11-16 MED ORDER — MORPHINE SULFATE 2 MG/ML IJ SOLN
1.0000 mg | INTRAMUSCULAR | Status: AC | PRN
  Administered 2014-11-16: 2 mg via INTRAVENOUS

## 2014-11-16 MED ORDER — SODIUM CHLORIDE 0.9 % IV SOLN: 250.0000 mL | INTRAVENOUS | Status: DC

## 2014-11-16 MED ORDER — ROCURONIUM BROMIDE 100 MG/10ML IV SOLN
INTRAVENOUS | Status: DC | PRN
  Administered 2014-11-16: 50 mg via INTRAVENOUS

## 2014-11-16 MED ORDER — ALBUMIN HUMAN 5 % IV SOLN
INTRAVENOUS | Status: AC
  Filled 2014-11-16: qty 250

## 2014-11-16 MED ORDER — MONTELUKAST SODIUM 10 MG PO TABS
10.0000 mg | ORAL_TABLET | Freq: Every day | ORAL | Status: DC
  Administered 2014-11-17 – 2014-11-20 (×4): 10 mg via ORAL
  Filled 2014-11-16 (×5): qty 1

## 2014-11-16 MED ORDER — PROPOFOL 10 MG/ML IV BOLUS
INTRAVENOUS | Status: AC
  Filled 2014-11-16: qty 20

## 2014-11-16 MED ORDER — SODIUM CHLORIDE 0.9 % IV SOLN
Freq: Once | INTRAVENOUS | Status: AC
  Administered 2014-11-16: 16:00:00 via INTRAVENOUS

## 2014-11-16 MED ORDER — ASPIRIN EC 325 MG PO TBEC
325.0000 mg | DELAYED_RELEASE_TABLET | Freq: Every day | ORAL | Status: DC
  Administered 2014-11-17: 325 mg via ORAL
  Filled 2014-11-16 (×2): qty 1

## 2014-11-16 MED ORDER — POTASSIUM CHLORIDE 10 MEQ/50ML IV SOLN
10.0000 meq | INTRAVENOUS | Status: AC
  Administered 2014-11-16 (×3): 10 meq via INTRAVENOUS

## 2014-11-16 MED ORDER — FENTANYL CITRATE (PF) 250 MCG/5ML IJ SOLN
INTRAMUSCULAR | Status: AC
  Filled 2014-11-16: qty 5

## 2014-11-16 MED ORDER — LACTATED RINGERS IV SOLN: 500.0000 mL | Freq: Once | INTRAVENOUS | Status: AC | PRN

## 2014-11-16 MED ORDER — LIDOCAINE HCL (CARDIAC) 20 MG/ML IV SOLN
INTRAVENOUS | Status: DC | PRN
  Administered 2014-11-16: 40 mg via INTRAVENOUS

## 2014-11-16 MED ORDER — AMIODARONE HCL IN DEXTROSE 360-4.14 MG/200ML-% IV SOLN
INTRAVENOUS | Status: AC
  Filled 2014-11-16: qty 200

## 2014-11-16 MED ORDER — DEXTROSE 5 % IV SOLN
0.0000 ug/min | INTRAVENOUS | Status: DC
  Administered 2014-11-17: 10 ug/min via INTRAVENOUS
  Filled 2014-11-16: qty 2

## 2014-11-16 MED ORDER — ASPIRIN 81 MG PO CHEW: 324.0000 mg | CHEWABLE_TABLET | Freq: Every day | ORAL | Status: DC

## 2014-11-16 MED ORDER — MIDAZOLAM HCL 5 MG/5ML IJ SOLN
INTRAMUSCULAR | Status: DC | PRN
  Administered 2014-11-16: 2 mg via INTRAVENOUS
  Administered 2014-11-16 (×2): 1 mg via INTRAVENOUS
  Administered 2014-11-16 (×2): 3 mg via INTRAVENOUS

## 2014-11-16 MED ORDER — VECURONIUM BROMIDE 10 MG IV SOLR
INTRAVENOUS | Status: DC | PRN
  Administered 2014-11-16: 3 mg via INTRAVENOUS
  Administered 2014-11-16: 2 mg via INTRAVENOUS
  Administered 2014-11-16: 3 mg via INTRAVENOUS

## 2014-11-16 MED ORDER — GLYCOPYRROLATE 0.2 MG/ML IJ SOLN
INTRAMUSCULAR | Status: DC | PRN
  Administered 2014-11-16: 0.4 mg via INTRAVENOUS

## 2014-11-16 MED ORDER — AMIODARONE HCL IN DEXTROSE 360-4.14 MG/200ML-% IV SOLN
INTRAVENOUS | Status: DC | PRN
  Administered 2014-11-16: 60 mg/h via INTRAVENOUS

## 2014-11-16 MED ORDER — BISACODYL 10 MG RE SUPP: 10.0000 mg | Freq: Every day | RECTAL | Status: DC

## 2014-11-16 MED ORDER — MAGNESIUM SULFATE 4 GM/100ML IV SOLN
4.0000 g | Freq: Once | INTRAVENOUS | Status: AC
  Administered 2014-11-16: 4 g via INTRAVENOUS
  Filled 2014-11-16: qty 100

## 2014-11-16 MED ORDER — OXYCODONE HCL 5 MG PO TABS
5.0000 mg | ORAL_TABLET | ORAL | Status: DC | PRN
  Administered 2014-11-16: 10 mg via ORAL
  Administered 2014-11-17: 5 mg via ORAL
  Administered 2014-11-17 – 2014-11-18 (×3): 10 mg via ORAL
  Filled 2014-11-16: qty 2
  Filled 2014-11-16: qty 1
  Filled 2014-11-16: qty 2
  Filled 2014-11-16: qty 1
  Filled 2014-11-16: qty 2
  Filled 2014-11-16: qty 1

## 2014-11-16 MED ORDER — THROMBIN 20000 UNITS EX SOLR
CUTANEOUS | Status: AC
  Filled 2014-11-16: qty 20000

## 2014-11-16 MED ORDER — SODIUM CHLORIDE 0.9 % IV SOLN: INTRAVENOUS | Status: DC

## 2014-11-16 MED ORDER — FENTANYL CITRATE (PF) 100 MCG/2ML IJ SOLN
INTRAMUSCULAR | Status: DC | PRN
  Administered 2014-11-16: 50 ug via INTRAVENOUS
  Administered 2014-11-16: 250 ug via INTRAVENOUS
  Administered 2014-11-16: 100 ug via INTRAVENOUS
  Administered 2014-11-16: 50 ug via INTRAVENOUS
  Administered 2014-11-16: 400 ug via INTRAVENOUS

## 2014-11-16 MED ORDER — SODIUM BICARBONATE 8.4 % IV SOLN
50.0000 meq | Freq: Once | INTRAVENOUS | Status: AC
Start: 2014-11-16 — End: 2014-11-16
  Administered 2014-11-16: 50 meq via INTRAVENOUS

## 2014-11-16 MED ORDER — TRAMADOL HCL 50 MG PO TABS: 50.0000 mg | ORAL_TABLET | ORAL | Status: DC | PRN

## 2014-11-16 MED ORDER — HEPARIN SODIUM (PORCINE) 1000 UNIT/ML IJ SOLN
INTRAMUSCULAR | Status: AC
  Filled 2014-11-16: qty 1

## 2014-11-16 MED ORDER — LEVOFLOXACIN IN D5W 750 MG/150ML IV SOLN
750.0000 mg | INTRAVENOUS | Status: AC
  Administered 2014-11-17: 750 mg via INTRAVENOUS
  Filled 2014-11-16: qty 150

## 2014-11-16 MED ORDER — AMIODARONE HCL IN DEXTROSE 360-4.14 MG/200ML-% IV SOLN
30.0000 mg/h | INTRAVENOUS | Status: DC
  Administered 2014-11-16: 30 mg/h via INTRAVENOUS
  Filled 2014-11-16 (×3): qty 200

## 2014-11-16 MED ORDER — DOCUSATE SODIUM 100 MG PO CAPS
200.0000 mg | ORAL_CAPSULE | Freq: Every day | ORAL | Status: DC
  Administered 2014-11-17 – 2014-11-18 (×2): 200 mg via ORAL
  Filled 2014-11-16 (×2): qty 2

## 2014-11-16 MED ORDER — CHLORHEXIDINE GLUCONATE 0.12 % MT SOLN: 15.0000 mL | Freq: Two times a day (BID) | OROMUCOSAL | Status: DC

## 2014-11-16 MED ORDER — THROMBIN 20000 UNITS EX SOLR
OROMUCOSAL | Status: DC | PRN
  Administered 2014-11-16: 09:00:00 via TOPICAL

## 2014-11-16 MED ORDER — MIDAZOLAM HCL 2 MG/2ML IJ SOLN: 2.0000 mg | INTRAMUSCULAR | Status: DC | PRN

## 2014-11-16 MED ORDER — VANCOMYCIN HCL IN DEXTROSE 1-5 GM/200ML-% IV SOLN
1000.0000 mg | Freq: Once | INTRAVENOUS | Status: AC
  Administered 2014-11-16: 1000 mg via INTRAVENOUS
  Filled 2014-11-16: qty 200

## 2014-11-16 MED ORDER — METOPROLOL TARTRATE 1 MG/ML IV SOLN: 2.5000 mg | INTRAVENOUS | Status: DC | PRN

## 2014-11-16 MED ORDER — SODIUM CHLORIDE 0.45 % IV SOLN: INTRAVENOUS | Status: DC | PRN

## 2014-11-16 MED ORDER — FAMOTIDINE IN NACL 20-0.9 MG/50ML-% IV SOLN
20.0000 mg | Freq: Two times a day (BID) | INTRAVENOUS | Status: AC
  Administered 2014-11-16: 20 mg via INTRAVENOUS

## 2014-11-16 MED ORDER — CETYLPYRIDINIUM CHLORIDE 0.05 % MT LIQD
7.0000 mL | Freq: Four times a day (QID) | OROMUCOSAL | Status: DC
  Administered 2014-11-16: 7 mL via OROMUCOSAL

## 2014-11-16 MED ORDER — SODIUM CHLORIDE 0.9 % IJ SOLN: 3.0000 mL | INTRAMUSCULAR | Status: DC | PRN

## 2014-11-16 MED ORDER — CHLORHEXIDINE GLUCONATE 4 % EX LIQD: 30.0000 mL | CUTANEOUS | Status: DC

## 2014-11-16 MED ORDER — CETYLPYRIDINIUM CHLORIDE 0.05 % MT LIQD
7.0000 mL | Freq: Two times a day (BID) | OROMUCOSAL | Status: DC
  Administered 2014-11-16 – 2014-11-18 (×4): 7 mL via OROMUCOSAL

## 2014-11-16 MED ORDER — PHENYLEPHRINE HCL 10 MG/ML IJ SOLN
INTRAMUSCULAR | Status: DC | PRN
  Administered 2014-11-16: 60 ug via INTRAVENOUS

## 2014-11-16 MED ORDER — VECURONIUM BROMIDE 10 MG IV SOLR
INTRAVENOUS | Status: AC
  Filled 2014-11-16: qty 10

## 2014-11-16 MED ORDER — NITROGLYCERIN IN D5W 200-5 MCG/ML-% IV SOLN: 0.0000 ug/min | INTRAVENOUS | Status: DC

## 2014-11-16 MED FILL — Sodium Chloride IV Soln 0.9%: INTRAVENOUS | Qty: 2000 | Status: AC

## 2014-11-16 MED FILL — Sodium Bicarbonate IV Soln 8.4%: INTRAVENOUS | Qty: 50 | Status: AC

## 2014-11-16 MED FILL — Heparin Sodium (Porcine) Inj 1000 Unit/ML: INTRAMUSCULAR | Qty: 10 | Status: AC

## 2014-11-16 MED FILL — Electrolyte-R (PH 7.4) Solution: INTRAVENOUS | Qty: 3000 | Status: AC

## 2014-11-16 MED FILL — Mannitol IV Soln 20%: INTRAVENOUS | Qty: 500 | Status: AC

## 2014-11-16 MED FILL — Lidocaine HCl IV Inj 20 MG/ML: INTRAVENOUS | Qty: 5 | Status: AC

## 2014-11-16 SURGICAL SUPPLY — 60 items
ADAPTER CARDIO PERF ANTE/RETRO (ADAPTER) ×3 IMPLANT
BAG DECANTER FOR FLEXI CONT (MISCELLANEOUS) IMPLANT
BLADE STERNUM SYSTEM 6 (BLADE) ×3 IMPLANT
BLADE SURG 15 STRL LF DISP TIS (BLADE) ×2 IMPLANT
BLADE SURG 15 STRL SS (BLADE) ×1
CANISTER SUCTION 2500CC (MISCELLANEOUS) ×3 IMPLANT
CANNULA GUNDRY RCSP 15FR (MISCELLANEOUS) ×3 IMPLANT
CATH HEART VENT LEFT (CATHETERS) ×2 IMPLANT
CATH ROBINSON RED A/P 18FR (CATHETERS) ×9 IMPLANT
CATH THORACIC 36FR (CATHETERS) ×3 IMPLANT
CATH THORACIC 36FR RT ANG (CATHETERS) ×3 IMPLANT
CONT SPEC 4OZ CLIKSEAL STRL BL (MISCELLANEOUS) ×3 IMPLANT
CONT SPEC STER OR (MISCELLANEOUS) ×3 IMPLANT
COVER SURGICAL LIGHT HANDLE (MISCELLANEOUS) ×3 IMPLANT
CRADLE DONUT ADULT HEAD (MISCELLANEOUS) ×3 IMPLANT
DRAPE SLUSH/WARMER DISC (DRAPES) ×3 IMPLANT
DRSG COVADERM 4X14 (GAUZE/BANDAGES/DRESSINGS) ×3 IMPLANT
ELECT CAUTERY BLADE 6.4 (BLADE) ×3 IMPLANT
ELECT REM PT RETURN 9FT ADLT (ELECTROSURGICAL) ×6
ELECTRODE REM PT RTRN 9FT ADLT (ELECTROSURGICAL) ×4 IMPLANT
GAUZE SPONGE 4X4 12PLY STRL (GAUZE/BANDAGES/DRESSINGS) ×3 IMPLANT
GLOVE BIO SURGEON STRL SZ 6 (GLOVE) IMPLANT
GLOVE BIO SURGEON STRL SZ 6.5 (GLOVE) IMPLANT
GLOVE BIO SURGEON STRL SZ7 (GLOVE) ×9 IMPLANT
GLOVE BIO SURGEON STRL SZ7.5 (GLOVE) ×3 IMPLANT
GLOVE EUDERMIC 7 POWDERFREE (GLOVE) ×6 IMPLANT
GOWN STRL REUS W/ TWL LRG LVL3 (GOWN DISPOSABLE) ×8 IMPLANT
GOWN STRL REUS W/ TWL XL LVL3 (GOWN DISPOSABLE) ×2 IMPLANT
GOWN STRL REUS W/TWL LRG LVL3 (GOWN DISPOSABLE) ×4
GOWN STRL REUS W/TWL XL LVL3 (GOWN DISPOSABLE) ×1
HEART VENT LT CURVED (MISCELLANEOUS) ×3 IMPLANT
HEMOSTAT POWDER SURGIFOAM 1G (HEMOSTASIS) ×9 IMPLANT
HEMOSTAT SURGICEL 2X14 (HEMOSTASIS) IMPLANT
KIT BASIN OR (CUSTOM PROCEDURE TRAY) ×3 IMPLANT
KIT CATH CPB BARTLE (MISCELLANEOUS) ×3 IMPLANT
KIT ROOM TURNOVER OR (KITS) ×3 IMPLANT
KIT SUCTION CATH 14FR (SUCTIONS) ×3 IMPLANT
NS IRRIG 1000ML POUR BTL (IV SOLUTION) ×18 IMPLANT
PACK OPEN HEART (CUSTOM PROCEDURE TRAY) ×3 IMPLANT
PAD ARMBOARD 7.5X6 YLW CONV (MISCELLANEOUS) ×6 IMPLANT
SPONGE GAUZE 4X4 12PLY STER LF (GAUZE/BANDAGES/DRESSINGS) ×3 IMPLANT
SUT BONE WAX W31G (SUTURE) ×3 IMPLANT
SUT ETHIBON 2 0 V 52N 30 (SUTURE) ×6 IMPLANT
SUT PROLENE 3 0 SH DA (SUTURE) IMPLANT
SUT PROLENE 3 0 SH1 36 (SUTURE) ×3 IMPLANT
SUT PROLENE 4 0 RB 1 (SUTURE) ×4
SUT PROLENE 4-0 RB1 .5 CRCL 36 (SUTURE) ×8 IMPLANT
SUT STEEL 6MS V (SUTURE) ×6 IMPLANT
SUT VIC AB 1 CTX 36 (SUTURE) ×2
SUT VIC AB 1 CTX36XBRD ANBCTR (SUTURE) ×4 IMPLANT
SUTURE E-PAK OPEN HEART (SUTURE) ×3 IMPLANT
SYSTEM SAHARA CHEST DRAIN ATS (WOUND CARE) ×3 IMPLANT
TAPE CLOTH SURG 6X10 WHT LF (GAUZE/BANDAGES/DRESSINGS) ×3 IMPLANT
TOWEL OR 17X24 6PK STRL BLUE (TOWEL DISPOSABLE) ×6 IMPLANT
TOWEL OR 17X26 10 PK STRL BLUE (TOWEL DISPOSABLE) ×6 IMPLANT
TRAY FOLEY IC TEMP SENS 16FR (CATHETERS) ×3 IMPLANT
UNDERPAD 30X30 INCONTINENT (UNDERPADS AND DIAPERS) ×3 IMPLANT
VALVE MAGNA EASE 21MM (Prosthesis & Implant Heart) ×3 IMPLANT
VENT LEFT HEART 12002 (CATHETERS) ×3
WATER STERILE IRR 1000ML POUR (IV SOLUTION) ×6 IMPLANT

## 2014-11-16 NOTE — Progress Notes (Signed)
Dr. Cyndia Bent paged with abg results after weaning. Will give 1 amp bicarb and then extubate per order. Will continue to monitor.  Sandre Kitty

## 2014-11-16 NOTE — Progress Notes (Signed)
Pt states is hypoglycemic if does not eat.  CBG checked at 69.  Asymptomatic at this time.  Anesthesia informed.

## 2014-11-16 NOTE — Anesthesia Preprocedure Evaluation (Addendum)
Anesthesia Evaluation  Patient identified by MRN, date of birth, ID band Patient awake    Reviewed: Allergy & Precautions, NPO status , Patient's Chart, lab work & pertinent test results  Airway Mallampati: II       Dental  (+) Teeth Intact   Pulmonary former smoker,    Pulmonary exam normal       Cardiovascular Normal cardiovascular exam+ Valvular Problems/Murmurs AS     Neuro/Psych  Neuromuscular disease    GI/Hepatic hiatal hernia,   Endo/Other    Renal/GU      Musculoskeletal   Abdominal   Peds  Hematology   Anesthesia Other Findings   Reproductive/Obstetrics                            Anesthesia Physical Anesthesia Plan  ASA: III  Anesthesia Plan: General   Post-op Pain Management:    Induction: Intravenous  Airway Management Planned: Oral ETT  Additional Equipment: Arterial line, CVP, PA Cath and TEE  Intra-op Plan:   Post-operative Plan: Post-operative intubation/ventilation  Informed Consent: I have reviewed the patients History and Physical, chart, labs and discussed the procedure including the risks, benefits and alternatives for the proposed anesthesia with the patient or authorized representative who has indicated his/her understanding and acceptance.     Plan Discussed with: CRNA, Anesthesiologist and Surgeon  Anesthesia Plan Comments:         Anesthesia Quick Evaluation

## 2014-11-16 NOTE — OR Nursing (Addendum)
First call to 2S '@1057'$ . Second call to 2S '@1124'$ . Out of room call [phone].

## 2014-11-16 NOTE — Anesthesia Postprocedure Evaluation (Signed)
  Anesthesia Post-op Note  Patient: Angeliz Settlemyre Goettl  Procedure(s) Performed: Procedure(s): AORTIC VALVE REPLACEMENT (AVR) (N/A) TRANSESOPHAGEAL ECHOCARDIOGRAM (TEE) (N/A)  Patient Location: SICU  Anesthesia Type:General  Level of Consciousness: sedated and Patient remains intubated per anesthesia plan  Airway and Oxygen Therapy: Patient remains intubated per anesthesia plan and Patient placed on Ventilator (see vital sign flow sheet for setting)  Post-op Pain: none  Post-op Assessment: Post-op Vital signs reviewed, Patient's Cardiovascular Status Stable, Respiratory Function Stable, Patent Airway, No signs of Nausea or vomiting and Pain level controlled              Post-op Vital Signs: stable  Last Vitals:  Filed Vitals:   11/16/14 0555  BP: 129/59  Pulse: 57  Temp: 36.9 C  Resp: 16    Complications: No apparent anesthesia complications

## 2014-11-16 NOTE — Op Note (Signed)
CARDIOVASCULAR SURGERY OPERATIVE NOTE  11/16/2014 [name] [id number]  Surgeon:  Gaye Pollack, MD  First Assistant: Jadene Pierini,  PA-C   Preoperative Diagnosis:  Critical aortic stenosis   Postoperative Diagnosis:  Same   Procedure:  1. Median Sternotomy 2. Extracorporeal circulation 3.   Aortic valve replacement using a 21 mm Edwards Magna-Ease pericardial valve.  Anesthesia:  General Endotracheal   Clinical History/Surgical Indication:  The patient is a 81 year old woman with a history of known severe aortic stenosis with suspected bicuspid aortic valve disease. I initially saw her in August 2015 and her echo on 09/26/2013 showed a mean gradient of 49 and a peak of 64 with an AVA of 0.54 by VTI and 0.61 by Vmax. Her aorta is normal sized. The mean gradient 1 year prior was 47 mm Hg. Left ventricular function remained normal. The patient was asymptomatic. She spends the winters in [place] and remains active playing doubles tennis without any fatigue, shortness of breath, chest pain or dizziness. She did have one episode of near syncope after eating lunch one day with dizziness and nausea and noted to be pale and diaphoretic with bradycardia. She was hospitalized and observed and no abnormality was found. It was felt to be vagal. She has not had any further episodes. She was not felt to be a TAVR candidate with a bicuspid valve and a low operative mortality and did not want to have open surgical AVR since she was asymptomatic. Over the past year she has continued to remain active playing tennis and denies any symptoms with that. She says that the only time she has shortness of breath and fatigue is when walking up hills. She has had no chest pain or shortness of breath. A recent echo shows that her mean gradient has increased to 60 mm Hg with a DI of 0.18, AVA of 0.51 cm 2.  She has severe aortic stenosis with a bicuspid valve that is severely calcified and the mean gradient is  now 68. She is minimally symptomatic with shortness of breath and fatigue only with walking up inclines. I agree with Dr. Burt Knack that the best treatment for her is AVR since her gradient has increased from 47 to 60 over the past year. I discussed the risks of continued observation including sudden decompensation with syncope, pulmonary edema or sudden death, and progressive LV dysfunction. She is not a TAVR candidate due to her bicuspid aortic valve and low operative mortality and I have explained this to her and her husband and daughter with her today and her other daughter who is a MSW in cardiology social work and is conferenced in on our visit today by their cell phone. I discussed the use of a tissue valve and the benefits and risk of that choice for her. She is in agreement with that. I discussed the operative procedure with the patient and family including alternatives, benefits and risks; including but not limited to bleeding, blood transfusion, infection, stroke, myocardial infarction, graft failure, heart block requiring a permanent pacemaker, organ dysfunction, and death. [name] understands and agrees to proceed.   Preparation:  The patient was seen in the preoperative holding area and the correct patient, correct operation were confirmed with the patient after reviewing the medical record and catheterization. The consent was signed by me. Preoperative antibiotics were given. A pulmonary arterial line and radial arterial line were placed by the anesthesia team. The patient was taken back to the operating room and  positioned supine on the operating room table. After being placed under general endotracheal anesthesia by the anesthesia team a foley catheter was placed. The neck, chest, abdomen, and both legs were prepped with betadine soap and solution and draped in the usual sterile manner. A surgical time-out was taken and the correct patient and operative procedure were confirmed with the  nursing and anesthesia staff.   Pre-bypass TEE:   Complete TEE assessment was performed by Dr. Sherren Kerns. This showed a bicuspid aortic valve with severe leaflet and annular calcification and severe stenosis, no AI, mild MR, normal LV function    Post-bypass TEE:   Normal functioning prosthetic aortic valve with no perivalvular leak or regurgitation through the valve. Left ventricular function preserved. Trivial mitral regurgitation.    Cardiopulmonary Bypass:  A median sternotomy was performed. The pericardium was opened in the midline. Right ventricular function appeared normal. The ascending aorta was of normal size and had no palpable plaque. There were no contraindications to aortic cannulation or cross-clamping. The patient was fully systemically heparinized and the ACT was maintained > 400 sec. The proximal aortic arch was cannulated with a 20 F aortic cannula for arterial inflow. Venous cannulation was performed via the right atrial appendage using a two-staged venous cannula. An antegrade cardioplegia/vent cannula was inserted into the mid-ascending aorta. A left ventricular vent was placed via the right superior pulmonary vein. A retrograde cardioplegia cannnula was placed into the coronary sinus via the right atrium. Aortic occlusion was performed with a single cross-clamp. Systemic cooling to 32 degrees Centigrade and topical cooling of the heart with iced saline were used. Hyperkalemic antegrade cold blood cardioplegia was used to induce diastolic arrest and then cold blood retrograde cardioplegia was given at about 20 minute intervals throughout the period of arrest to maintain myocardial temperature at or below 10 degrees centigrade. A temperature probe was inserted into the interventricular septum and an insulating pad was placed in the pericardium. Carbon dioxide was insufflated into the pericardium at 5L/min throughout the procedure to minimize intracardiac air.   Aortic Valve  Replacement:   A transverse aortotomy was performed 1 cm above the take-off of the right coronary artery. The native valve was bicuspid with 3 commissures but complete fusion of the left and right cusps with calcified leaflets and moderate annular calcification. The ostia of the coronary arteries were in normal position and were not obstructed. The native valve leaflets were excised and the annulus was decalcified with rongeurs. Care was taken to remove all particulate debris. The left ventricle was directly inspected for debris and then irrigated with ice saline solution. The annulus was sized and a size 21 mm Edwards Magna-Ease pericardial valve was chosen. The model number was [id number] and the serial number was [id number]. While the valve was being prepared 2-0 Ethibond pledgeted horizontal mattress sutures were placed around the annulus with the pledgets in a sub-annular position. The sutures were placed through the sewing ring and the valve lowered into place. The sutures were tied sequentially. The valve seated nicely and the coronary ostia were not obstructed. The prosthetic valve leaflets moved normally and there was no sub-valvular obstruction. The aortotomy was closed using 4-0 Prolene suture in 2 layers with felt strips to reinforce the closure.  Completion:  The patient was rewarmed to 37 degrees Centigrade. De-airing maneuvers were performed and the head placed in trendelenburg position. The crossclamp was removed with a time of 82 minutes. There was spontaneous return of sinus rhythm. The aortotomy was  checked for hemostasis. Two temporary epicardial pacing wires were placed on the right atrium and two on the right ventricle. The left ventricular vent and retrograde cardioplegia cannulas were removed. The patient was weaned from CPB without difficulty on no inotropes. CPB time was 105 minutes. Cardiac output was 5 LPM. Heparin was fully reversed with protamine and the aortic and venous cannulas  removed. Hemostasis was achieved. Mediastinal drainage tubes were placed. The sternum was closed with  #6 stainless steel wires. The fascia was closed with continuous # 1 vicryl suture. The subcutaneous tissue was closed with 2-0 vicryl continuous suture. The skin was closed with 3-0 vicryl subcuticular suture. All sponge, needle, and instrument counts were reported correct at the end of the case. Dry sterile dressings were placed over the incisions and around the chest tubes which were connected to pleurevac suction. The patient was then transported to the surgical intensive care unit in critical but stable condition.

## 2014-11-16 NOTE — Progress Notes (Signed)
Patient ID: MIJA EFFERTZ, female   DOB: [birth year], 81 y.o.   MRN: [id number] EVENING ROUNDS NOTE :     [address]       [place] [address]             [phone]                 Day of Surgery Procedure(s) (LRB): AORTIC VALVE REPLACEMENT (AVR) (N/A) TRANSESOPHAGEAL ECHOCARDIOGRAM (TEE) (N/A)  Total Length of Stay:  LOS: 0 days  BP 103/50 mmHg  Pulse 86  Temp(Src) 98.8 F (37.1 C) (Core (Comment))  Resp 19  Wt 155 lb 1.2 oz (70.342 kg)  SpO2 100%  .Intake/Output      07/20 0701 - 07/21 0700 07/21 0701 - 07/22 0700   I.V. (mL/kg)  2842.4 (40.4)   Blood  1253   IV Piggyback  1550   Total Intake(mL/kg)  5645.4 (80.3)   Urine (mL/kg/hr)  2500 (3.2)   Blood  1525 (1.9)   Chest Tube  780 (1)   Total Output   4805   Net   +840.4          . sodium chloride    . [START ON 11/17/2014] sodium chloride    . sodium chloride 20 mL/hr at 11/16/14 1700  . amiodarone    . dexmedetomidine Stopped (11/16/14 1530)  . insulin (NOVOLIN-R) infusion 0.5 Units/hr (11/16/14 1700)  . lactated ringers    . lactated ringers 20 mL/hr at 11/16/14 1700  . nitroGLYCERIN Stopped (11/16/14 1215)  . phenylephrine (NEO-SYNEPHRINE) Adult infusion 15.067 mcg/min (11/16/14 1700)     Lab Results  Component Value Date   WBC 12.9* 11/16/2014   HGB 11.1* 11/16/2014   HCT 32.1* 11/16/2014   PLT 129* 11/16/2014   GLUCOSE 157* 11/16/2014   CHOL 250* 12/01/2013   TRIG 79.0 12/01/2013   HDL 83.00 12/01/2013   LDLDIRECT 144.0 11/29/2012   LDLCALC 151* 12/01/2013   ALT 16 11/14/2014   AST 28 11/14/2014   NA 139 11/16/2014   K 3.7 11/16/2014   CL 102 11/16/2014   CREATININE 0.40* 11/16/2014   BUN 11 11/16/2014   CO2 19* 11/14/2014   TSH 0.96 12/01/2013   INR 1.73* 11/16/2014   HGBA1C 5.8* 11/14/2014   Now extubated , neuro intact 700 ml from chest tubes, given plts and ffp, now decreased to 50 ml hour   Grace Isaac MD  Beeper [phone] Office  [phone] 11/16/2014 6:13 PM

## 2014-11-16 NOTE — Procedures (Signed)
Extubation Procedure Note  Patient Details:   Name: [name] DOB: [birth year] MRN: [id number]   Airway Documentation:  Airway 7.5 mm (Active)  Secured at (cm) 21 cm 11/16/2014  3:10 PM  Measured From Lips 11/16/2014  3:10 PM  Secured Location Right 11/16/2014  3:10 PM  Secured By Pink Tape 11/16/2014  3:10 PM  Site Condition Dry 11/16/2014  3:10 PM    Evaluation  O2 sats: stable throughout Complications: No apparent complications Patient did tolerate procedure well. Bilateral Breath Sounds: Clear   Yes   Pt. Was extubated to a 3L [place] without any complications, dyspnea or stridor noted. Pt. Achieved a goal of 1.2 on VC & -30 on NIF. Pt. Was instructed on IS x 5, highest goal achieved was 770m.   CClaretta Fraise7/21/2016, 3:58 PM

## 2014-11-16 NOTE — Transfer of Care (Signed)
Immediate Anesthesia Transfer of Care Note  Patient: Amariona Rathje Schowalter  Procedure(s) Performed: Procedure(s): AORTIC VALVE REPLACEMENT (AVR) (N/A) TRANSESOPHAGEAL ECHOCARDIOGRAM (TEE) (N/A)  Patient Location: SICU  Anesthesia Type:General  Level of Consciousness: sedated and Patient remains intubated per anesthesia plan  Airway & Oxygen Therapy: Patient remains intubated per anesthesia plan and Patient placed on Ventilator (see vital sign flow sheet for setting)  Post-op Assessment: Report given to RN and Post -op Vital signs reviewed and stable  Post vital signs: Reviewed and stable  Last Vitals:  Filed Vitals:   11/16/14 0555  BP: 129/59  Pulse: 57  Temp: 36.9 C  Resp: 16    Complications: No apparent anesthesia complications

## 2014-11-16 NOTE — Progress Notes (Signed)
Utilization Review Completed.Donne Anon T7/21/2016

## 2014-11-16 NOTE — Progress Notes (Signed)
Dr. Cyndia Bent paged and made aware of low cardiac index, not being greater than 1.3. He is not worried about this since heart function is healthy. Also chest tube output 420 ml since surgery. Will continue to monitor. It is okay to wean patient once she meets other parameters. Sandre Kitty

## 2014-11-16 NOTE — Brief Op Note (Signed)
11/16/2014      [address]       ,[place] [address]             [phone]     11/16/2014  10:44 AM  PATIENT:  [name]  81 y.o. female  PRE-OPERATIVE DIAGNOSIS:  SEVERE AORTIC STENOSIS  POST-OPERATIVE DIAGNOSIS:  SEVERE AORTIC STENOSIS  PROCEDURE:  Procedure(s): AORTIC VALVE REPLACEMENT (AVR)#21 MAGNAEASE TRANSESOPHAGEAL ECHOCARDIOGRAM (TEE)  SURGEON:  Surgeon(s): Gaye Pollack, MD  PHYSICIAN ASSISTANT: WAYNE GOLD PA-C  ANESTHESIA:   general  PATIENT CONDITION:  ICU - intubated and hemodynamically stable.  PRE-OPERATIVE WEIGHT: 70kg  Aortic Valve  Procedure Performed:  Replacement: Yes.  Bioprosthetic Valve. Implant Model Number:[id number], Size:21, Unique Device Identifier:[id number]  Repair/Reconstruction: No.   Aortic Annular Enlargement: No.    Aortic Valve Etiology   Aortic Insufficiency:  Mild  Aortic Valve Disease:  Yes.  Aortic Stenosis:  Yes. Smallest Aortic Valve Area: .59cm2; Highest Mean Gradient: 38mHg.  Etiology (Choose at least one and up to  5 etiologies):  Bicuspid valve disease and Degenerative - Calcified

## 2014-11-16 NOTE — Progress Notes (Signed)
  Echocardiogram Echocardiogram Transesophageal has been performed.  [name] 11/16/2014, 9:01 AM

## 2014-11-16 NOTE — Interval H&P Note (Signed)
History and Physical Interval Note:  11/16/2014 7:18 AM  [name]  has presented today for surgery, with the diagnosis of SEVERE AS  The various methods of treatment have been discussed with the patient and family. After consideration of risks, benefits and other options for treatment, the patient has consented to  Procedure(s): AORTIC VALVE REPLACEMENT (AVR) (N/A) TRANSESOPHAGEAL ECHOCARDIOGRAM (TEE) (N/A) as a surgical intervention .  The patient's history has been reviewed, patient examined, no change in status, stable for surgery.  I have reviewed the patient's chart and labs.  Questions were answered to the patient's satisfaction.     Gaye Pollack

## 2014-11-16 NOTE — Anesthesia Procedure Notes (Addendum)
Anesthesia Procedure Note Central line insertion note. Skin prepped and draped in sterile fashion. Patent vessel identified on u/s using linear probe. Needle advanced under live u/s guidance with aspiration of blood upon entry into vessel. Catheter passed easily over finder needle. Wire passed easily through catheter and location confirmed with u/s. Introducer catheter advanced over wire, with aspiration of blood through all ports for confirmation. Line sutured and dressing applied. Pt tolerated well with no immediate complications.  Procedure Name: Intubation Date/Time: 11/16/2014 7:50 AM Performed by: Kyung Rudd Pre-anesthesia Checklist: Patient identified, Emergency Drugs available, Suction available, Patient being monitored and Timeout performed Patient Re-evaluated:Patient Re-evaluated prior to inductionOxygen Delivery Method: Circle system utilized Preoxygenation: Pre-oxygenation with 100% oxygen Intubation Type: IV induction Ventilation: Mask ventilation without difficulty Laryngoscope Size: Mac and 3 Grade View: Grade IV Tube type: Oral Tube size: 7.5 mm Number of attempts: 1 Airway Equipment and Method: Bougie stylet Placement Confirmation: positive ETCO2 and breath sounds checked- equal and bilateral Secured at: 21 cm Tube secured with: Tape Dental Injury: Teeth and Oropharynx as per pre-operative assessment and Injury to tongue  Comments: Limited neck mobility.

## 2014-11-17 ENCOUNTER — Encounter (HOSPITAL_COMMUNITY): Payer: Self-pay | Admitting: Surgery

## 2014-11-17 ENCOUNTER — Inpatient Hospital Stay (HOSPITAL_COMMUNITY): Payer: Medicare Other

## 2014-11-17 LAB — MAGNESIUM
Magnesium: 2.1 mg/dL (ref 1.7–2.4)
Magnesium: 1.5 mg/dL — ABNORMAL LOW (ref 1.7–2.4)

## 2014-11-17 LAB — CREATININE, SERUM
Creatinine, Ser: 0.65 mg/dL (ref 0.44–1.00)
GFR calc Af Amer: >60 mL/min (ref >60)

## 2014-11-17 LAB — CBC
HCT: 24.6 % — ABNORMAL LOW (ref 36.0–46.0)
HEMATOCRIT: 23.7 % — AB (ref 36.0–46.0)
HEMOGLOBIN: 8.5 g/dL — AB (ref 12.0–15.0)
Hemoglobin: 7.9 g/dL — ABNORMAL LOW (ref 12.0–15.0)
MCH: 32 pg (ref 26.0–34.0)
MCH: 31.2 pg (ref 26.0–34.0)
MCHC: 34.6 g/dL (ref 30.0–36.0)
MCHC: 33.3 g/dL (ref 30.0–36.0)
MCV: 92.5 fL (ref 78.0–100.0)
MCV: 93.7 fL (ref 78.0–100.0)
PLATELETS: 91 10*3/uL — AB (ref 150–400)
Platelets: 110 10*3/uL — ABNORMAL LOW (ref 150–400)
RBC: 2.66 MIL/uL — ABNORMAL LOW (ref 3.87–5.11)
RBC: 2.53 MIL/uL — AB (ref 3.87–5.11)
RDW: 13.5 % (ref 11.5–15.5)
RDW: 13.9 % (ref 11.5–15.5)
WBC: 10.3 10*3/uL (ref 4.0–10.5)
WBC: 12.2 10*3/uL — AB (ref 4.0–10.5)

## 2014-11-17 LAB — BASIC METABOLIC PANEL
Anion gap: 7 (ref 5–15)
BUN: 8 mg/dL (ref 6–20)
CO2: 24 mmol/L (ref 22–32)
CREATININE: 0.57 mg/dL (ref 0.44–1.00)
Calcium: 8 mg/dL — ABNORMAL LOW (ref 8.9–10.3)
Chloride: 108 mmol/L (ref 101–111)
GFR calc Af Amer: >60 mL/min (ref >60)
GFR calc non Af Amer: >60 mL/min (ref >60)
GLUCOSE: 111 mg/dL — AB (ref 65–99)
POTASSIUM: 3.8 mmol/L (ref 3.5–5.1)
SODIUM: 139 mmol/L (ref 135–145)

## 2014-11-17 LAB — POCT I-STAT, CHEM 8
BUN: 7 mg/dL (ref 6–20)
CHLORIDE: 105 mmol/L (ref 101–111)
CREATININE: 0.6 mg/dL (ref 0.44–1.00)
Calcium, Ion: 1.14 mmol/L (ref 1.13–1.30)
Glucose, Bld: 121 mg/dL — ABNORMAL HIGH (ref 65–99)
HCT: 25 % — ABNORMAL LOW (ref 36.0–46.0)
Hemoglobin: 8.5 g/dL — ABNORMAL LOW (ref 12.0–15.0)
Potassium: 4 mmol/L (ref 3.5–5.1)
Sodium: 139 mmol/L (ref 135–145)
TCO2: 20 mmol/L (ref 0–100)

## 2014-11-17 LAB — GLUCOSE, CAPILLARY
GLUCOSE-CAPILLARY: 162 mg/dL — AB (ref 65–99)
GLUCOSE-CAPILLARY: 96 mg/dL (ref 65–99)
GLUCOSE-CAPILLARY: 108 mg/dL — AB (ref 65–99)
GLUCOSE-CAPILLARY: 117 mg/dL — AB (ref 65–99)
GLUCOSE-CAPILLARY: 105 mg/dL — AB (ref 65–99)
GLUCOSE-CAPILLARY: 112 mg/dL — AB (ref 65–99)
GLUCOSE-CAPILLARY: 108 mg/dL — AB (ref 65–99)
Glucose-Capillary: 101 mg/dL — ABNORMAL HIGH (ref 65–99)
Glucose-Capillary: 111 mg/dL — ABNORMAL HIGH (ref 65–99)
Glucose-Capillary: 126 mg/dL — ABNORMAL HIGH (ref 65–99)
Glucose-Capillary: 64 mg/dL — ABNORMAL LOW (ref 65–99)
Glucose-Capillary: 112 mg/dL — ABNORMAL HIGH (ref 65–99)
Glucose-Capillary: 98 mg/dL (ref 65–99)
Glucose-Capillary: 102 mg/dL — ABNORMAL HIGH (ref 65–99)
Glucose-Capillary: 140 mg/dL — ABNORMAL HIGH (ref 65–99)

## 2014-11-17 LAB — PREPARE FRESH FROZEN PLASMA: Unit division: 0

## 2014-11-17 LAB — PREPARE PLATELET PHERESIS: Unit division: 0

## 2014-11-17 LAB — HEMOGLOBIN AND HEMATOCRIT, BLOOD
HCT: 23.2 % — ABNORMAL LOW (ref 36.0–46.0)
Hemoglobin: 8 g/dL — ABNORMAL LOW (ref 12.0–15.0)

## 2014-11-17 LAB — PLATELET COUNT: Platelets: 91 10*3/uL — ABNORMAL LOW (ref 150–400)

## 2014-11-17 MED ORDER — ALPRAZOLAM 0.25 MG PO TABS
0.2500 mg | ORAL_TABLET | Freq: Three times a day (TID) | ORAL | Status: DC | PRN
  Administered 2014-11-17 – 2014-11-20 (×7): 0.25 mg via ORAL
  Filled 2014-11-17 (×7): qty 1

## 2014-11-17 MED ORDER — POTASSIUM CHLORIDE CRYS ER 20 MEQ PO TBCR
40.0000 meq | EXTENDED_RELEASE_TABLET | Freq: Once | ORAL | Status: AC
  Administered 2014-11-17: 40 meq via ORAL
  Filled 2014-11-17: qty 2

## 2014-11-17 MED ORDER — AMIODARONE HCL 200 MG PO TABS
200.0000 mg | ORAL_TABLET | Freq: Two times a day (BID) | ORAL | Status: DC
  Administered 2014-11-17 – 2014-11-19 (×5): 200 mg via ORAL
  Filled 2014-11-17 (×6): qty 1

## 2014-11-17 MED ORDER — FUROSEMIDE 10 MG/ML IJ SOLN
40.0000 mg | Freq: Once | INTRAMUSCULAR | Status: AC
  Administered 2014-11-17: 40 mg via INTRAVENOUS
  Filled 2014-11-17: qty 4

## 2014-11-17 MED ORDER — INSULIN ASPART 100 UNIT/ML ~~LOC~~ SOLN: 0.0000 [IU] | SUBCUTANEOUS | Status: DC

## 2014-11-17 MED ORDER — INSULIN ASPART 100 UNIT/ML ~~LOC~~ SOLN
0.0000 [IU] | SUBCUTANEOUS | Status: DC
  Administered 2014-11-17 – 2014-11-18 (×2): 2 [IU] via SUBCUTANEOUS

## 2014-11-17 MED ORDER — METOCLOPRAMIDE HCL 5 MG/ML IJ SOLN
10.0000 mg | Freq: Four times a day (QID) | INTRAMUSCULAR | Status: AC
  Administered 2014-11-17 – 2014-11-18 (×4): 10 mg via INTRAVENOUS
  Filled 2014-11-17 (×2): qty 2

## 2014-11-17 MED FILL — Potassium Chloride Inj 2 mEq/ML: INTRAVENOUS | Qty: 40 | Status: AC

## 2014-11-17 MED FILL — Magnesium Sulfate Inj 50%: INTRAMUSCULAR | Qty: 10 | Status: AC

## 2014-11-17 MED FILL — Heparin Sodium (Porcine) Inj 1000 Unit/ML: INTRAMUSCULAR | Qty: 30 | Status: AC

## 2014-11-17 NOTE — Progress Notes (Signed)
1 Day Post-Op Procedure(s) (LRB): AORTIC VALVE REPLACEMENT (AVR) (N/A) TRANSESOPHAGEAL ECHOCARDIOGRAM (TEE) (N/A) Subjective:  No complaints  Objective: Vital signs in last 24 hours: Temp:  [95 F (35 C)-99.9 F (37.7 C)] 99.3 F (37.4 C) (07/22 0700) Pulse Rate:  [51-92] 86 (07/22 0700) Cardiac Rhythm:  [-] Atrial paced (07/22 0600) Resp:  [7-29] 19 (07/22 0700) BP: (86-148)/(43-85) 124/54 mmHg (07/22 0700) SpO2:  [90 %-100 %] 98 % (07/22 0700) Arterial Line BP: (83-119)/(42-70) 110/50 mmHg (07/22 0700) FiO2 (%):  [3 %-50 %] 3 % (07/21 1600) Weight:  [74.707 kg (164 lb 11.2 oz)] 74.707 kg (164 lb 11.2 oz) (07/22 0438)  Hemodynamic parameters for last 24 hours: PAP: (21-37)/(5-19) 28/10 mmHg CO:  [2.2 L/min-4.3 L/min] 4.3 L/min CI:  [1.2 L/min/m2-2.4 L/min/m2] 2.4 L/min/m2  Intake/Output from previous day: 07/21 0701 - 07/22 0700 In: 6873.9 [I.V.:3870.9; Blood:1253; IV Piggyback:1750] Out: 6269 [Urine:3410; Blood:1525; Chest Tube:990] Intake/Output this shift:    General appearance: alert and cooperative Neurologic: intact Heart: regular rate and rhythm, S1, S2 normal, no murmur, click, rub or gallop Lungs: clear to auscultation bilaterally Extremities: edema mild Wound: dressing dry  Lab Results:  Recent Labs  11/16/14 1920 11/16/14 1923 11/17/14 0400  WBC 10.6*  --  10.3  HGB 8.6* 8.8* 8.5*  HCT 25.2* 26.0* 24.6*  PLT 114*  --  110*   BMET:  Recent Labs  11/14/14 1647  11/16/14 1923 11/17/14 0400  NA 130*  < > 144 139  K 3.7  < > 3.9 3.8  CL 100*  < > 106 108  CO2 19*  --   --  24  GLUCOSE 201*  < > 160* 111*  BUN 13  < > 10 8  CREATININE 0.74  < > 0.60 0.57  CALCIUM 8.6*  --   --  8.0*  < > = values in this interval not displayed.  PT/INR:  Recent Labs  11/16/14 1230  LABPROT 20.2*  INR 1.73*   ABG    Component Value Date/Time   PHART 7.361 11/16/2014 1727   HCO3 20.8 11/16/2014 1727   TCO2 21 11/16/2014 1923   ACIDBASEDEF 4.0*  11/16/2014 1727   O2SAT 98.0 11/16/2014 1727   CBG (last 3)   Recent Labs  11/17/14 0301 11/17/14 0402 11/17/14 0554  GLUCAP 117* 112* 98   CXR: ok  ECG: sinus brady 51, nonspecific T wave abnormality.  Assessment/Plan: S/P Procedure(s) (LRB): AORTIC VALVE REPLACEMENT (AVR) (N/A) TRANSESOPHAGEAL ECHOCARDIOGRAM (TEE) (N/A)  She is hemodynamically stable  Sinus brady on amio that was started in the OR for A-fib after coming off pump. Will switch to PO. Hold off on lopressor for now. Mobilize Diuresis d/c tubes/lines Continue foley due to diuresing patient and patient in ICU See progression orders  Possibly out to the floor later today.   LOS: 1 day    Gaye Pollack 11/17/2014

## 2014-11-17 NOTE — Progress Notes (Signed)
Patient ID: [name], female   DOB: [birth year], 81 y.o.   MRN: [id number]  SICU Evening Rounds:  Hemodynamics stable today  Diuresed   OOB and ambulated  Some nausea and vomiting this am but none since Reglan  BMET    Component Value Date/Time   NA 139 11/17/2014 1651   NA 135* 10/31/2014 1430   K 4.0 11/17/2014 1651   K 4.3 10/31/2014 1430   CL 105 11/17/2014 1651   CO2 24 11/17/2014 0400   CO2 24 10/31/2014 1430   GLUCOSE 121* 11/17/2014 1651   GLUCOSE 95 10/31/2014 1430   BUN 7 11/17/2014 1651   BUN 15.0 10/31/2014 1430   CREATININE 0.65 11/17/2014 1655   CREATININE 0.8 10/31/2014 1430   CALCIUM 8.0* 11/17/2014 0400   CALCIUM 9.0 10/31/2014 1430   GFRNONAA >60 11/17/2014 1655   GFRAA >60 11/17/2014 1655    CBC    Component Value Date/Time   WBC 12.2* 11/17/2014 1655   WBC 8.0 10/31/2014 1430   RBC 2.53* 11/17/2014 1655   RBC 4.26 10/31/2014 1430   HGB 7.9* 11/17/2014 1655   HGB 13.5 10/31/2014 1430   HCT 23.7* 11/17/2014 1655   HCT 40.7 10/31/2014 1430   PLT 91* 11/17/2014 1655   PLT 190 10/31/2014 1430   MCV 93.7 11/17/2014 1655   MCV 95.4 10/31/2014 1430   MCH 31.2 11/17/2014 1655   MCH 31.6 10/31/2014 1430   MCHC 33.3 11/17/2014 1655   MCHC 33.2 10/31/2014 1430   RDW 13.9 11/17/2014 1655   RDW 13.2 10/31/2014 1430   LYMPHSABS 2.2 10/31/2014 1430   LYMPHSABS 1.9 09/30/2011 1000   MONOABS 0.6 10/31/2014 1430   MONOABS 0.4 09/30/2011 1000   EOSABS 0.1 10/31/2014 1430   EOSABS 0.1 09/30/2011 1000   BASOSABS 0.0 10/31/2014 1430   BASOSABS 0.0 09/30/2011 1000    A/P: stable, continue current plans

## 2014-11-17 NOTE — Care Management Note (Signed)
Case Management Note  Patient Details  Name: [name] MRN: [id number] Date of Birth: 06/25/1932  Subjective/Objective:     Pt lives with spouse, reports dtr from [place] will also be staying with her to assist after discharge.  A friend who is a critical care nurse is @ bedside and reports she will be available if needed.                         Expected Discharge Plan:  [facility]  Discharge planning Services  CM Consult   Amily Depp, Kym Groom, [place] 11/17/2014, 1:00 PM

## 2014-11-18 LAB — CBC
HEMATOCRIT: 22.8 % — AB (ref 36.0–46.0)
HEMOGLOBIN: 7.6 g/dL — AB (ref 12.0–15.0)
MCH: 31.4 pg (ref 26.0–34.0)
MCHC: 33.3 g/dL (ref 30.0–36.0)
MCV: 94.2 fL (ref 78.0–100.0)
Platelets: 82 10*3/uL — ABNORMAL LOW (ref 150–400)
RBC: 2.42 MIL/uL — ABNORMAL LOW (ref 3.87–5.11)
RDW: 14 % (ref 11.5–15.5)
WBC: 10.4 10*3/uL (ref 4.0–10.5)

## 2014-11-18 LAB — GLUCOSE, CAPILLARY
GLUCOSE-CAPILLARY: 127 mg/dL — AB (ref 65–99)
GLUCOSE-CAPILLARY: 127 mg/dL — AB (ref 65–99)
GLUCOSE-CAPILLARY: 137 mg/dL — AB (ref 65–99)
Glucose-Capillary: 119 mg/dL — ABNORMAL HIGH (ref 65–99)
Glucose-Capillary: 120 mg/dL — ABNORMAL HIGH (ref 65–99)

## 2014-11-18 LAB — BASIC METABOLIC PANEL
ANION GAP: 7 (ref 5–15)
BUN: 8 mg/dL (ref 6–20)
CHLORIDE: 103 mmol/L (ref 101–111)
CO2: 25 mmol/L (ref 22–32)
Calcium: 8.1 mg/dL — ABNORMAL LOW (ref 8.9–10.3)
Creatinine, Ser: 0.69 mg/dL (ref 0.44–1.00)
GFR calc Af Amer: >60 mL/min (ref >60)
GFR calc non Af Amer: >60 mL/min (ref >60)
Glucose, Bld: 132 mg/dL — ABNORMAL HIGH (ref 65–99)
POTASSIUM: 4.2 mmol/L (ref 3.5–5.1)
Sodium: 135 mmol/L (ref 135–145)

## 2014-11-18 MED ORDER — TRAMADOL HCL 50 MG PO TABS: 50.0000 mg | ORAL_TABLET | ORAL | Status: DC | PRN

## 2014-11-18 MED ORDER — DOCUSATE SODIUM 100 MG PO CAPS
200.0000 mg | ORAL_CAPSULE | Freq: Every day | ORAL | Status: DC
  Administered 2014-11-19 – 2014-11-21 (×3): 200 mg via ORAL
  Filled 2014-11-18 (×3): qty 2

## 2014-11-18 MED ORDER — SODIUM CHLORIDE 0.9 % IV SOLN: 250.0000 mL | INTRAVENOUS | Status: DC | PRN

## 2014-11-18 MED ORDER — ACETAMINOPHEN 325 MG PO TABS
650.0000 mg | ORAL_TABLET | Freq: Four times a day (QID) | ORAL | Status: DC | PRN
  Administered 2014-11-20 – 2014-11-21 (×2): 650 mg via ORAL
  Filled 2014-11-18 (×2): qty 2

## 2014-11-18 MED ORDER — FERROUS GLUCONATE 324 (38 FE) MG PO TABS
324.0000 mg | ORAL_TABLET | Freq: Two times a day (BID) | ORAL | Status: DC
  Administered 2014-11-18 – 2014-11-21 (×7): 324 mg via ORAL
  Filled 2014-11-18 (×9): qty 1

## 2014-11-18 MED ORDER — BISACODYL 5 MG PO TBEC: 10.0000 mg | DELAYED_RELEASE_TABLET | Freq: Every day | ORAL | Status: DC | PRN

## 2014-11-18 MED ORDER — POTASSIUM CHLORIDE CRYS ER 20 MEQ PO TBCR
20.0000 meq | EXTENDED_RELEASE_TABLET | Freq: Every day | ORAL | Status: DC
  Administered 2014-11-18 – 2014-11-19 (×2): 20 meq via ORAL
  Filled 2014-11-18 (×3): qty 1

## 2014-11-18 MED ORDER — SODIUM CHLORIDE 0.9 % IJ SOLN: 3.0000 mL | INTRAMUSCULAR | Status: DC | PRN

## 2014-11-18 MED ORDER — MOVING RIGHT ALONG BOOK
Freq: Once | Status: AC
  Administered 2014-11-18: 1
  Filled 2014-11-18: qty 1

## 2014-11-18 MED ORDER — FUROSEMIDE 40 MG PO TABS
40.0000 mg | ORAL_TABLET | Freq: Every day | ORAL | Status: DC
  Administered 2014-11-18 – 2014-11-19 (×2): 40 mg via ORAL
  Filled 2014-11-18 (×2): qty 1

## 2014-11-18 MED ORDER — SODIUM CHLORIDE 0.9 % IJ SOLN
3.0000 mL | Freq: Two times a day (BID) | INTRAMUSCULAR | Status: DC
  Administered 2014-11-19 – 2014-11-21 (×3): 3 mL via INTRAVENOUS

## 2014-11-18 MED ORDER — BISACODYL 10 MG RE SUPP
10.0000 mg | Freq: Every day | RECTAL | Status: DC | PRN
  Filled 2014-11-18: qty 1

## 2014-11-18 MED ORDER — ONDANSETRON HCL 4 MG PO TABS: 4.0000 mg | ORAL_TABLET | Freq: Four times a day (QID) | ORAL | Status: DC | PRN

## 2014-11-18 MED ORDER — OXYCODONE HCL 5 MG PO TABS
5.0000 mg | ORAL_TABLET | ORAL | Status: DC | PRN
  Administered 2014-11-19 – 2014-11-20 (×3): 5 mg via ORAL
  Filled 2014-11-18 (×4): qty 1

## 2014-11-18 MED ORDER — FAMOTIDINE 20 MG PO TABS
20.0000 mg | ORAL_TABLET | Freq: Two times a day (BID) | ORAL | Status: DC
  Administered 2014-11-18 – 2014-11-21 (×7): 20 mg via ORAL
  Filled 2014-11-18 (×8): qty 1

## 2014-11-18 MED ORDER — METOCLOPRAMIDE HCL 5 MG PO TABS
5.0000 mg | ORAL_TABLET | Freq: Three times a day (TID) | ORAL | Status: DC
  Administered 2014-11-18 – 2014-11-19 (×3): 5 mg via ORAL
  Filled 2014-11-18 (×6): qty 1

## 2014-11-18 MED ORDER — ONDANSETRON HCL 4 MG/2ML IJ SOLN: 4.0000 mg | Freq: Four times a day (QID) | INTRAMUSCULAR | Status: DC | PRN

## 2014-11-18 NOTE — Progress Notes (Addendum)
2 Days Post-Op Procedure(s) (LRB): AORTIC VALVE REPLACEMENT (AVR) (N/A) TRANSESOPHAGEAL ECHOCARDIOGRAM (TEE) (N/A) Subjective:  No complaints  Objective: Vital signs in last 24 hours: Temp:  [97.6 F (36.4 C)-99.4 F (37.4 C)] 98.2 F (36.8 C) (07/23 0757) Pulse Rate:  [86-88] 86 (07/23 0800) Cardiac Rhythm:  [-] Atrial paced (07/23 0800) Resp:  [17-26] 19 (07/23 0800) BP: (78-153)/(40-66) 108/59 mmHg (07/23 0800) SpO2:  [87 %-100 %] 92 % (07/23 0800) Arterial Line BP: (106-134)/(51-67) 121/59 mmHg (07/22 1300) Weight:  [73 kg (160 lb 15 oz)] 73 kg (160 lb 15 oz) (07/23 0500)  Hemodynamic parameters for last 24 hours: PAP: (28-42)/(12-25) 35/18 mmHg  Intake/Output from previous day: 07/22 0701 - 07/23 0700 In: 1595.2 [P.O.:840; I.V.:605.2; IV Piggyback:150] Out: 2410 [Urine:2360; Chest Tube:50] Intake/Output this shift: Total I/O In: 20 [I.V.:20] Out: 75 [Urine:75]  General appearance: alert and cooperative Neurologic: intact Heart: regular rate and rhythm, S1, S2 normal, no murmur, click, rub or gallop Lungs: diminished breath sounds bibasilar Extremities: extremities normal, atraumatic, no cyanosis or edema Wound: dressing dry  Lab Results:  Recent Labs  11/17/14 1655 11/18/14 0445  WBC 12.2* 10.4  HGB 7.9* 7.6*  HCT 23.7* 22.8*  PLT 91* 82*   BMET:  Recent Labs  11/17/14 0400 11/17/14 1651 11/17/14 1655 11/18/14 0445  NA 139 139  --  135  K 3.8 4.0  --  4.2  CL 108 105  --  103  CO2 24  --   --  25  GLUCOSE 111* 121*  --  132*  BUN 8 7  --  8  CREATININE 0.57 0.60 0.65 0.69  CALCIUM 8.0*  --   --  8.1*    PT/INR:  Recent Labs  11/16/14 1230  LABPROT 20.2*  INR 1.73*   ABG    Component Value Date/Time   PHART 7.361 11/16/2014 1727   HCO3 20.8 11/16/2014 1727   TCO2 20 11/17/2014 1651   ACIDBASEDEF 4.0* 11/16/2014 1727   O2SAT 98.0 11/16/2014 1727   CBG (last 3)   Recent Labs  11/17/14 2020 11/17/14 2359 11/18/14 0405  GLUCAP  108* 120* 127*    Assessment/Plan: S/P Procedure(s) (LRB): AORTIC VALVE REPLACEMENT (AVR) (N/A) TRANSESOPHAGEAL ECHOCARDIOGRAM (TEE) (N/A) Mobilize Diuresis: weight down 4 lbs from yesterday. Still 5 lbs over preop Expected acute postop blood loss anemia: start iron. Thrombocytopenia: observe, hold ASA for now. Postop afib: continue oral amio. May need to stop if nausea persists. No beta blocker for now while on amio since HR 70. Plan for transfer to step-down: see transfer orders   LOS: 2 days    Gaye Pollack 11/18/2014

## 2014-11-19 ENCOUNTER — Inpatient Hospital Stay (HOSPITAL_COMMUNITY): Payer: Medicare Other

## 2014-11-19 LAB — CBC
HCT: 22.5 % — ABNORMAL LOW (ref 36.0–46.0)
Hemoglobin: 7.6 g/dL — ABNORMAL LOW (ref 12.0–15.0)
MCH: 32.1 pg (ref 26.0–34.0)
MCHC: 33.8 g/dL (ref 30.0–36.0)
MCV: 94.9 fL (ref 78.0–100.0)
PLATELETS: 91 10*3/uL — AB (ref 150–400)
RBC: 2.37 MIL/uL — ABNORMAL LOW (ref 3.87–5.11)
RDW: 13.8 % (ref 11.5–15.5)
WBC: 11.2 10*3/uL — AB (ref 4.0–10.5)

## 2014-11-19 LAB — BASIC METABOLIC PANEL
Anion gap: 9 (ref 5–15)
BUN: 10 mg/dL (ref 6–20)
CHLORIDE: 101 mmol/L (ref 101–111)
CO2: 24 mmol/L (ref 22–32)
CREATININE: 0.59 mg/dL (ref 0.44–1.00)
Calcium: 8.1 mg/dL — ABNORMAL LOW (ref 8.9–10.3)
GFR calc Af Amer: >60 mL/min (ref >60)
GFR calc non Af Amer: >60 mL/min (ref >60)
Glucose, Bld: 181 mg/dL — ABNORMAL HIGH (ref 65–99)
POTASSIUM: 4 mmol/L (ref 3.5–5.1)
SODIUM: 134 mmol/L — AB (ref 135–145)

## 2014-11-19 MED ORDER — AMIODARONE LOAD VIA INFUSION
150.0000 mg | Freq: Once | INTRAVENOUS | Status: DC
  Filled 2014-11-19: qty 83.34

## 2014-11-19 MED ORDER — METOPROLOL TARTRATE 12.5 MG HALF TABLET
12.5000 mg | ORAL_TABLET | Freq: Two times a day (BID) | ORAL | Status: DC
  Administered 2014-11-19 – 2014-11-21 (×5): 12.5 mg via ORAL
  Filled 2014-11-19 (×6): qty 1

## 2014-11-19 MED ORDER — AMIODARONE HCL 200 MG PO TABS
400.0000 mg | ORAL_TABLET | Freq: Two times a day (BID) | ORAL | Status: DC
  Administered 2014-11-19 – 2014-11-20 (×3): 400 mg via ORAL
  Filled 2014-11-19 (×5): qty 2

## 2014-11-19 MED ORDER — POTASSIUM CHLORIDE CRYS ER 20 MEQ PO TBCR
20.0000 meq | EXTENDED_RELEASE_TABLET | Freq: Two times a day (BID) | ORAL | Status: DC
  Administered 2014-11-19 – 2014-11-21 (×4): 20 meq via ORAL
  Filled 2014-11-19 (×5): qty 1

## 2014-11-19 MED ORDER — METOPROLOL TARTRATE 1 MG/ML IV SOLN
INTRAVENOUS | Status: AC
  Administered 2014-11-19: 5 mg
  Filled 2014-11-19: qty 5

## 2014-11-19 MED ORDER — FUROSEMIDE 40 MG PO TABS
40.0000 mg | ORAL_TABLET | Freq: Two times a day (BID) | ORAL | Status: DC
  Administered 2014-11-19 – 2014-11-21 (×4): 40 mg via ORAL
  Filled 2014-11-19 (×6): qty 1

## 2014-11-19 MED ORDER — METOPROLOL TARTRATE 1 MG/ML IV SOLN
5.0000 mg | Freq: Once | INTRAVENOUS | Status: AC
Start: 2014-11-19 — End: 2014-11-19
  Administered 2014-11-19: 5 mg via INTRAVENOUS

## 2014-11-19 NOTE — Progress Notes (Signed)
Patient ambulated 550 ft with rolling walker,02 at 2 liters via nasal cannula and standby assist.  Patient tolerated well.  Heart rate at rest 120-160.  Patient asymptomatic.  MD being paged.  Waiting on MD to return phone call.

## 2014-11-19 NOTE — Progress Notes (Signed)
Patient refused AM lab draws and requested for lab to come back at 8 am for morning labs.

## 2014-11-19 NOTE — Progress Notes (Addendum)
[address]       [place] [address]             [phone]      3 Days Post-Op Procedure(s) (LRB): AORTIC VALVE REPLACEMENT (AVR) (N/A) TRANSESOPHAGEAL ECHOCARDIOGRAM (TEE) (N/A) Subjective: In afib with RVR starting last night  Objective: Vital signs in last 24 hours: Temp:  [97.4 F (36.3 C)-98.9 F (37.2 C)] 98.9 F (37.2 C) (07/24 0428) Pulse Rate:  [38-130] 130 (07/24 0700) Cardiac Rhythm:  [-] Atrial paced (07/23 1900) Resp:  [18-27] 18 (07/24 0428) BP: (94-113)/(44-72) 110/72 mmHg (07/24 0700) SpO2:  [81 %-100 %] 100 % (07/24 0700) Weight:  [161 lb 14.4 oz (73.437 kg)] 161 lb 14.4 oz (73.437 kg) (07/24 5176)  Hemodynamic parameters for last 24 hours:    Intake/Output from previous day: 07/23 0701 - 07/24 0700 In: 900 [P.O.:840; I.V.:60] Out: 1236 [Urine:1235; Stool:1] Intake/Output this shift:    General appearance: alert, cooperative and no distress Heart: irregularly irregular rhythm Lungs: milfly dim in the bases Abdomen: benign Extremities: no edema Wound: incis healinh well  Lab Results:  Recent Labs  11/17/14 1655 11/18/14 0445  WBC 12.2* 10.4  HGB 7.9* 7.6*  HCT 23.7* 22.8*  PLT 91* 82*   BMET:  Recent Labs  11/17/14 0400 11/17/14 1651 11/17/14 1655 11/18/14 0445  NA 139 139  --  135  K 3.8 4.0  --  4.2  CL 108 105  --  103  CO2 24  --   --  25  GLUCOSE 111* 121*  --  132*  BUN 8 7  --  8  CREATININE 0.57 0.60 0.65 0.69  CALCIUM 8.0*  --   --  8.1*    PT/INR:  Recent Labs  11/16/14 1230  LABPROT 20.2*  INR 1.73*   ABG    Component Value Date/Time   PHART 7.361 11/16/2014 1727   HCO3 20.8 11/16/2014 1727   TCO2 20 11/17/2014 1651   ACIDBASEDEF 4.0* 11/16/2014 1727   O2SAT 98.0 11/16/2014 1727   CBG (last 3)   Recent Labs  11/18/14 0753 11/18/14 1208 11/18/14 1640  GLUCAP 119* 127* 137*    Meds Scheduled Meds: . amiodarone  200 mg Oral BID  . docusate sodium  200 mg Oral Daily  .  famotidine  20 mg Oral BID  . ferrous gluconate  324 mg Oral BID WC  . furosemide  40 mg Oral Daily  . metoCLOPramide  5 mg Oral TID AC  . montelukast  10 mg Oral QHS  . potassium chloride  20 mEq Oral Daily  . sodium chloride  3 mL Intravenous Q12H   Continuous Infusions:  PRN Meds:.sodium chloride, acetaminophen, ALPRAZolam, bisacodyl **OR** bisacodyl, fluticasone, ondansetron **OR** ondansetron (ZOFRAN) IV, oxyCODONE, sodium chloride, traMADol  Xrays No results found.  Assessment/Plan: S/P Procedure(s) (LRB): AORTIC VALVE REPLACEMENT (AVR) (N/A) TRANSESOPHAGEAL ECHOCARDIOGRAM (TEE) (N/A)  1 afin with RVR has returned,  Will start low dose beta blocker and give IV bolus of amio- already on 200 bid po. Nausea is improved but will have to watch 2 ABL anemia on FE- recheck CBC and follow-up on platelets too 3 cont diuresis for vol overload/ pleural effus 4 pulm toilet/cardiac rehab- routine 5 cbg's stopped  LOS: 3 days    GOLD,WAYNE E 11/19/2014   Chart reviewed, patient examined, agree with above. Will increase the amio to 400 bid. She has small bilateral pleural effusions and weight is still 6 lbs  over preop so will increase lasix to 40 bid

## 2014-11-20 LAB — CBC
HCT: 22.4 % — ABNORMAL LOW (ref 36.0–46.0)
Hemoglobin: 7.7 g/dL — ABNORMAL LOW (ref 12.0–15.0)
MCH: 32.4 pg (ref 26.0–34.0)
MCHC: 34.4 g/dL (ref 30.0–36.0)
MCV: 94.1 fL (ref 78.0–100.0)
Platelets: 112 10*3/uL — ABNORMAL LOW (ref 150–400)
RBC: 2.38 MIL/uL — ABNORMAL LOW (ref 3.87–5.11)
RDW: 13.7 % (ref 11.5–15.5)
WBC: 11.7 10*3/uL — AB (ref 4.0–10.5)

## 2014-11-20 NOTE — Progress Notes (Signed)
CARDIAC REHAB PHASE I   PRE:  Rate/Rhythm: 67 SR    BP: sitting 108/43    SaO2: 95 1/2L  MODE:  Ambulation: 590 ft   POST:  Rate/Rhythm: 87 SR    BP: sitting 129/56     SaO2: 92 RA  Pt able to walk with RW and without O2. Steady, rest x2. SaO2 92-93 RA during walk (spot checks). Reapplied 1/2L in bed for nap. Pt only inspiring 500 mL on IS. Encouraged more use. Will f/u tomorrow. To walk x2 more today. [phone]   Josephina Shih [place] CES, ACSM 11/20/2014 10:23 AM

## 2014-11-20 NOTE — Progress Notes (Signed)
Removed epicardial wires by PA Erin Barrett. Pt tolerated procedure well.  Pt instructed to remain on bedrest for one hour.  Frequent vitals will be taken and documented. Pt resting with call bell within reach. Payton Emerald, RN

## 2014-11-20 NOTE — Progress Notes (Addendum)
      [address]       [place] [address]             [phone]      4 Days Post-Op Procedure(s) (LRB): AORTIC VALVE REPLACEMENT (AVR) (N/A) TRANSESOPHAGEAL ECHOCARDIOGRAM (TEE) (N/A)   Subjective:  [name] has a few complaints this morning.  She states that she felt more tired yesterday.  She states she walked from the SICU to 2W on Saturday and yesterday she felt worn out.  I told her that was normal after surgery and she will feel worn out if she has done too much the day before.   She also complains of constipation.  She has moved her bowels once since surgery.  I explained that she may not move her bowels everyday with decreased activity, appetite and activity level.  She understood this.  Objective: Vital signs in last 24 hours: Temp:  [98.2 F (36.8 C)-98.9 F (37.2 C)] 98.9 F (37.2 C) (07/25 0501) Pulse Rate:  [61-63] 61 (07/25 0501) Cardiac Rhythm:  [-] Heart block (07/24 2351) Resp:  [17-18] 18 (07/25 0501) BP: (98-118)/(49-60) 118/51 mmHg (07/25 0501) SpO2:  [98 %-100 %] 98 % (07/25 0501)  Intake/Output from previous day: 07/24 0701 - 07/25 0700 In: 360 [P.O.:360] Out: 1401 [Urine:1400; Stool:1]  General appearance: alert, cooperative and no distress Heart: regular rate and rhythm Lungs: clear to auscultation bilaterally Abdomen: soft, non-tender; bowel sounds normal; no masses,  no organomegaly Extremities: edema trace Wound: clean and dry  Lab Results:  Recent Labs  11/19/14 0922 11/20/14 0408  WBC 11.2* 11.7*  HGB 7.6* 7.7*  HCT 22.5* 22.4*  PLT 91* 112*   BMET:  Recent Labs  11/18/14 0445 11/19/14 0922  NA 135 134*  K 4.2 4.0  CL 103 101  CO2 25 24  GLUCOSE 132* 181*  BUN 8 10  CREATININE 0.69 0.59  CALCIUM 8.1* 8.1*    PT/INR: No results for input(s): LABPROT, INR in the last 72 hours. ABG    Component Value Date/Time   PHART 7.361 11/16/2014 1727   HCO3 20.8 11/16/2014 1727   TCO2 20 11/17/2014 1651   ACIDBASEDEF 4.0* 11/16/2014 1727   O2SAT 98.0 11/16/2014 1727   CBG (last 3)   Recent Labs  11/18/14 0753 11/18/14 1208 11/18/14 1640  GLUCAP 119* 127* 137*    Assessment/Plan: S/P Procedure(s) (LRB): AORTIC VALVE REPLACEMENT (AVR) (N/A) TRANSESOPHAGEAL ECHOCARDIOGRAM (TEE) (N/A)  1. CV- previous A. Fib, maintaining NSR currently- on Amiodarone and Lopressor 2. Pulm- weaning oxygen as tolerated, Sats 99-100% on 2L, good use of IS 3. Renal- creatinine has been WNL, weight remains elevated continue Lasix 4. Acute post operative anemia- Hgb 7.7, patient is stable, likely will not need transfusion 5. Thrombocytopenia- improving 6. Dispo- patient doing well, Hgb stable, weaning oxygen, maintaining NSR, d/c EPW   LOS: 4 days    [name] 11/20/2014   Chart reviewed, patient examined, agree with above. Continue diuresis. She was negative yesterday but wt up a little more. She has no leg edema but probably some pleural effusion on exam.

## 2014-11-20 NOTE — Progress Notes (Signed)
Patient ambulated about 500 ft with family using rolling walker,steady pace,on room air tolerated well.Back to bed,O2 saturation at 96%.

## 2014-11-20 NOTE — Progress Notes (Signed)
Patient has ambulated in the hallway on room air approximately 600 feet each time.  No issues. Pt resting with call bell within reach.  Will continue to monitor. Payton Emerald, RN

## 2014-11-20 NOTE — Care Management Important Message (Signed)
Important Message  Patient Details  Name: [name] MRN: [id number] Date of Birth: October 18, 1932   Medicare Important Message Given:  Yes-second notification given    Nathen May 11/20/2014, 2:22 [place] Message  Patient Details  Name: [name] MRN: [id number] Date of Birth: 12-09-1932   Medicare Important Message Given:  Yes-second notification given    Nathen May 11/20/2014, 2:22 PM

## 2014-11-21 MED ORDER — AMIODARONE HCL 200 MG PO TABS: 200.0000 mg | ORAL_TABLET | Freq: Every day | ORAL | Status: DC

## 2014-11-21 MED ORDER — FERROUS GLUCONATE 324 (38 FE) MG PO TABS: 324.0000 mg | ORAL_TABLET | Freq: Every day | ORAL | Status: DC

## 2014-11-21 MED ORDER — AMIODARONE HCL 200 MG PO TABS
200.0000 mg | ORAL_TABLET | Freq: Every day | ORAL | Status: DC
  Administered 2014-11-21: 200 mg via ORAL
  Filled 2014-11-21: qty 1

## 2014-11-21 MED ORDER — SENNOSIDES-DOCUSATE SODIUM 8.6-50 MG PO TABS: 1.0000 | ORAL_TABLET | Freq: Every day | ORAL | Status: DC

## 2014-11-21 MED ORDER — POTASSIUM CHLORIDE CRYS ER 20 MEQ PO TBCR: 20.0000 meq | EXTENDED_RELEASE_TABLET | Freq: Every day | ORAL | Status: DC

## 2014-11-21 MED ORDER — FUROSEMIDE 40 MG PO TABS: 40.0000 mg | ORAL_TABLET | Freq: Every day | ORAL | Status: DC

## 2014-11-21 MED ORDER — METOPROLOL TARTRATE 25 MG PO TABS: 12.5000 mg | ORAL_TABLET | Freq: Two times a day (BID) | ORAL | Status: DC

## 2014-11-21 MED ORDER — TRAMADOL HCL 50 MG PO TABS: 50.0000 mg | ORAL_TABLET | ORAL | Status: DC | PRN

## 2014-11-21 NOTE — Discharge Summary (Signed)
Physician Discharge Summary       [facility]       ,[place] [address]             [phone]    Patient ID: [name] MRN: [id number] DOB/AGE: Aug 18, 1932 81 y.o.  Admit date: 11/16/2014 Discharge date: 11/21/2014  Admission Diagnoses: 1. Critical aortic stenosis 2. History of hypercholesterolemia 3. History of right breast cancer 4. History of tobacco abuse 5. History of bleeding ulcer  Discharge Diagnoses:  1. Critical aortic stenosis 2. History of hypercholesterolemia 3. History of right breast cancer 4. History of tobacco abuse 5. History of bleeding ulcer 6. A fib (converted to sinus rhythm) 7. ABL anemia 8. Mild thrombocytopenia  Procedure (s):  1. Median Sternotomy 2. Extracorporeal circulation 3. Aortic valve replacement using a 21 mm Edwards Magna-Ease pericardial valve by Dr. Cyndia Bent 11/16/2014.  History of Presenting Illness:  The patient is a 81 year old woman with a history of known severe aortic stenosis with suspected bicuspid aortic valve disease. I initially saw her in August 2015 and her echo on 09/26/2013 showed a mean gradient of 49 and a peak of 64 with an AVA of 0.54 by VTI and 0.61 by Vmax. Her aorta is normal sized. The mean gradient 1 year prior was 47 mm Hg. Left ventricular function remained normal. The patient was asymptomatic. She spends the winters in [place] and remains active playing doubles tennis without any fatigue, shortness of breath, chest pain or dizziness. She did have one episode of near syncope after eating lunch one day with dizziness and nausea and noted to be pale and diaphoretic with bradycardia. She was hospitalized and observed and no abnormality was found. It was felt to be vagal. She has not had any further episodes. She was not felt to be a TAVR candidate with a bicuspid valve and a low operative mortality and did not want to have open surgical AVR since she was asymptomatic. Over the past year she has  continued to remain active playing tennis and denies any symptoms with that. She says that the only time she has shortness of breath and fatigue is when walking up hills. She has had no chest pain or shortness of breath. A recent echo shows that her mean gradient has increased to 60 mm Hg with a DI of 0.18, AVA of 0.51 cm 2. Dr. Cyndia Bent discussed the need for aortic valve replacement. Potential risks, benefits, and complications were discussed with the patient and she agreed to proceed with surgery. Pre operative carotid duplex showed no significant carotid artery stenosis bilaterally. She underwent an aortic valve replacement on 11/16/2014.  Brief Hospital Course: The patient was extubated the afternoon of surgery without difficulty. He remained afebrile and hemodynamically stable. Gordy Councilman, a line, chest tubes, and foley were removed early in the post operative course. Lopressor was started . She did go into a fib with RVR. She was started on Amiodarone. She converted and was then put on oral Amiodarone. She was volume over loaded and diuresed. He/she had ABL anemia. She did not require a post op transfusion. Her last H and H was 7.7 and 22.4. She also had mild thrombocytopenia. Her last platelet count was up to 112,000. She was weaned off the insulin drip. The patient's glucose remained well controlled. The patient's HGA1C pre op was 5.8   The patient was felt surgically stable for transfer from the ICU to [facility]U for further convalescence on 11/19/2014. She continues to  progress with cardiac rehab. She was ambulating on room air. She has been tolerating a diet and has had a bowel movement. She remained in sinus rhythm with a heart rate in the 50's to low 60's. As a result, Amiodarone was stopped. Epicardial pacing wires were removed on 07/25.Chest tube sutures will be removed in the office after discharge. The patient is felt surgically stable for discharge today.   Latest Vital Signs: Blood pressure  124/55, pulse 69, temperature 99.1 F (37.3 C), temperature source Oral, resp. rate 18, height '5\' 4"'$  (1.626 m), weight 158 lb 11.7 oz (72 kg), SpO2 96 %.  Physical Exam: Cardiovascular: RRR, no murmur Pulmonary: Clear to auscultation bilaterally; no rales, wheezes, or rhonchi. Abdomen: Soft, non tender, bowel sounds present. Extremities: Trace bilateral lower extremity edema. Wounds: Clean and dry. No erythema or signs of infection.  Discharge Condition:Stable and discharged to home.  Recent laboratory studies:  Lab Results  Component Value Date   WBC 11.7* 11/20/2014   HGB 7.7* 11/20/2014   HCT 22.4* 11/20/2014   MCV 94.1 11/20/2014   PLT 112* 11/20/2014   Lab Results  Component Value Date   NA 134* 11/19/2014   K 4.0 11/19/2014   CL 101 11/19/2014   CO2 24 11/19/2014   CREATININE 0.59 11/19/2014   GLUCOSE 181* 11/19/2014      Diagnostic Studies: Dg Chest 2 View  11/19/2014   CLINICAL DATA:  Three days postop aortic valve replacement. Followup basilar atelectasis.  EXAM: CHEST  2 VIEW  COMPARISON:  11/17/2014 and earlier.  FINDINGS: Sternotomy for aortic valve replacement. Cardiac silhouette mildly enlarged, unchanged. Pulmonary vascularity normal without evidence pulmonary edema. Moderate-sized bilateral pleural effusions, increased since yesterday. Associated mild passive atelectasis in the lower lobes. Lungs otherwise clear. Interval Swan-Ganz catheter and mediastinal drainage tube removal.  IMPRESSION: 1. Moderate-sized bilateral pleural effusion and associated mild passive atelectasis in the lower lobes, increased since yesterday. 2. Stable mild cardiomegaly without pulmonary edema.   Electronically Signed   By: Evangeline Dakin M.D.   On: 11/19/2014 09:28    Discharge Medications:   Medication List    TAKE these medications        acetaminophen 500 MG tablet  Commonly known as:  TYLENOL  Take 500-1,000 mg by mouth at bedtime as needed for mild pain or moderate  pain.     ALPRAZolam 0.5 MG tablet  Commonly known as:  XANAX  Take 1 tablet (0.5 mg total) by mouth at bedtime. May take additional dose as needed for stress or abdominal pain related to IBS.     BENEFIBER DRINK MIX PO  Take 5 mLs by mouth every morning. Mix with 1/2 a glass water, and takes with vitamins     CALCIUM 600+D3 600-400 MG-UNIT Tabs  Generic drug:  Calcium Carb-Cholecalciferol  Take 1 tablet by mouth daily.     CALCIUM CITRATE PO  Take 500 mg by mouth daily.     chloral hydrate 500 MG capsule  Commonly known as:  SOMNOTE  Take 1 capsule (500 mg total) by mouth at bedtime. Liquid formula: take 10 mL by mouth at bedtime as needed for sleep.     Co Q-10 100 MG Caps  Take 1 capsule by mouth every morning.     CVS RED YEAST RICE PO  Take 600 mg by mouth daily.     ferrous gluconate 324 MG tablet  Commonly known as:  FERGON  Take 1 tablet (324 mg total) by mouth daily  with breakfast. For one month then stop.     fluticasone 50 MCG/ACT nasal spray  Commonly known as:  FLONASE  Place 1 spray into both nostrils as needed for allergies.     furosemide 40 MG tablet  Commonly known as:  LASIX  Take 1 tablet (40 mg total) by mouth daily.     HYDROcodone-homatropine 5-1.5 MG/5ML syrup  Commonly known as:  HYCODAN  Take 5 mLs by mouth every 8 (eight) hours as needed for cough.     LORazepam 0.5 MG tablet  Commonly known as:  ATIVAN  Take 0.5 mg by mouth as needed for anxiety.     Magnesium 400 MG Caps  Take 400 mg by mouth every morning.     metoprolol tartrate 25 MG tablet  Commonly known as:  LOPRESSOR  Take 0.5 tablets (12.5 mg total) by mouth 2 (two) times daily.     montelukast 10 MG tablet  Commonly known as:  SINGULAIR  Take 10 mg by mouth at bedtime.     OCUSOFT EYE [place] OP  Apply 1 application to eye 2 (two) times daily. Morning and before bed     potassium chloride SA 20 MEQ tablet  Commonly known as:  K-DUR,KLOR-CON  Take 1 tablet (20 mEq total)  by mouth daily.     senna-docusate 8.6-50 MG per tablet  Commonly known as:  Senokot-S  Take 1 tablet by mouth daily. With oral iron.     SYSTANE ULTRA PF 0.4-0.3 % Soln  Generic drug:  Polyethyl Glycol-Propyl Glycol  Apply 1 drop to eye 2 (two) times daily.     traMADol 50 MG tablet  Commonly known as:  ULTRAM  Take 1 tablet (50 mg total) by mouth every 4 (four) hours as needed for moderate pain.     vitamin B-12 1000 MCG tablet  Commonly known as:  CYANOCOBALAMIN  Take 1,000 mcg by mouth daily.     Vitamin D-3 1000 UNITS Caps  Take 1 capsule by mouth daily.        Follow Up Appointments: Follow-up Information    Follow up with [name], MD.   Specialty:  Cardiology   Why:  Call for a follow up appointment for 2 weeks   Contact information:   [address] [place] [address] [phone]       Follow up with Gaye Pollack, MD On 12/20/2014.   Specialty:  Cardiothoracic Surgery   Why:  PA/LAT CXR to be taken (at [facility] which is in the same building as Dr. Vivi Martens office) on 12/20/2014 at 10:15 am;Appointment time is at 11:00 am   Contact information:   [place] [address] [phone]       Follow up with Nurse On 11/28/2014.   Why:  Appointment is with nurse only to have chest tube suture removal. Appointment time is at 10:00 am   Contact information:   [address] [place] [address] [phone]      Signed: Lars Pinks MPA-C 11/21/2014, 12:46 PM

## 2014-11-21 NOTE — Progress Notes (Signed)
      [address]       [place] [address]             [phone]        5 Days Post-Op Procedure(s) (LRB): AORTIC VALVE REPLACEMENT (AVR) (N/A) TRANSESOPHAGEAL ECHOCARDIOGRAM (TEE) (N/A)  Subjective: Patient had a very bad night as she did not sleep. She states she does not do well without sleep. She wants to go home.  Objective: Vital signs in last 24 hours: Temp:  [97.8 F (36.6 C)-99.1 F (37.3 C)] 99.1 F (37.3 C) (07/26 0616) Pulse Rate:  [53-81] 60 (07/26 0616) Cardiac Rhythm:  [-] Normal sinus rhythm (07/25 1940) Resp:  [18] 18 (07/26 0616) BP: (103-129)/(49-75) 103/75 mmHg (07/26 0616) SpO2:  [94 %-96 %] 96 % (07/26 0616) Weight:  [158 lb 11.7 oz (72 kg)-162 lb 0.6 oz (73.5 kg)] 158 lb 11.7 oz (72 kg) (07/26 0616)  Pre op weight 70 kg Current Weight  11/21/14 158 lb 11.7 oz (72 kg)      Intake/Output from previous day: 07/25 0701 - 07/26 0700 In: -  Out: 700 [Urine:700]   Physical Exam:  Cardiovascular: RRR, no murmur Pulmonary: Clear to auscultation bilaterally; no rales, wheezes, or rhonchi. Abdomen: Soft, non tender, bowel sounds present. Extremities: Trace bilateral lower extremity edema. Wounds: Clean and dry.  No erythema or signs of infection.  Lab Results: CBC: Recent Labs  11/19/14 0922 11/20/14 0408  WBC 11.2* 11.7*  HGB 7.6* 7.7*  HCT 22.5* 22.4*  PLT 91* 112*   BMET:  Recent Labs  11/19/14 0922  NA 134*  K 4.0  CL 101  CO2 24  GLUCOSE 181*  BUN 10  CREATININE 0.59  CALCIUM 8.1*    PT/INR:  Lab Results  Component Value Date   INR 1.73* 11/16/2014   INR 1.07 11/14/2014   INR 1.0 10/10/2014   ABG:  INR: Will add last result for INR, ABG once components are confirmed Will add last 4 CBG results once components are confirmed  Assessment/Plan:  1. CV - Previous a fib with RVR. Maintaining SR in the low 60's. On Amiodarone 400 mg bid and Lopressor 12.5 mg bid. Will decrease Amiodarone to 200 mg  daily. 2.  Pulmonary - On room air. 3. Volume Overload - On Lasix 40 mg bid 4.  Acute blood loss anemia - H and H yesterday stable at 7.7 and 22.4.Continue Fergon. 5. Thrombocytopenia-platelets yesterday up to 112,000 6. Low grade fever to 99.1. Last WBC 11700.No signs of wound infection and no GU complaints. Likely atelectasis. 7. Patient wants to go home today. Will discuss discharge disposition with surgeon.  Jayd Forrey MPA-C 11/21/2014,7:37 AM

## 2014-11-21 NOTE — Care Management Note (Signed)
Case Management Note  Patient Details  Name: AVEY MCMANAMON MRN: [id number] Date of Birth: 10-Sep-1932  Subjective/Objective:             Pt admitted with s/p AVR       Action/Plan:  Pt is independent from home with family.  CM will monitor and determine disposition needs   Expected Discharge Date:                  Expected Discharge Plan:  Home/Self Care  In-House Referral:     Discharge planning Services  CM Consult  Post Acute Care Choice:    Choice offered to:     DME Arranged:  Walker rolling DME Agency:  [place]:    [place]:     Status of Service:  Completed, signed off  Medicare Important Message Given:  Yes-second notification given Date Medicare IM Given:    Medicare IM give by:    Date Additional Medicare IM Given:    Additional Medicare Important Message give by:     If discussed at [place] of Stay Meetings, dates discussed:    Additional Comments: CM offered pt choice, [facility] chose [facility], agency contacted, referral accepted. Maryclare Labrador, RN 11/21/2014, 2:39 PM

## 2014-11-21 NOTE — Discharge Instructions (Signed)
Activity: 1.May walk up steps °               2.No lifting more than ten pounds for four weeks.  °               3.No driving for four weeks. °               4.Stop any activity that causes chest pain, shortness of breath, dizziness, sweating or excessive weakness. °               5.Avoid straining. °               6.Continue with your breathing exercises daily. ° °Diet: Diabetic diet and Low fat, Low salt diet ° °Wound Care: May shower.  Clean wounds with mild soap and water daily. Contact the office at [phone] if any problems arise. ° °Aortic Valve Replacement, Care After °Refer to this sheet in the next few weeks. These instructions provide you with information on caring for yourself after your procedure. Your health care provider may also give you specific instructions. Your treatment has been planned according to current medical practices, but problems sometimes occur. Call your health care provider if you have any problems or questions after your procedure. °HOME CARE INSTRUCTIONS  °· Take medicines only as directed by your health care provider. °· If your health care provider has prescribed elastic stockings, wear them as directed. °· Take frequent naps or rest often throughout the day. °· Avoid lifting over 10 lbs (4.5 kg) or pushing or pulling things with your arms for 6-8 weeks or as directed by your health care provider. °· Avoid driving or airplane travel for 4-6 weeks after surgery or as directed by your health care provider. If you are riding in a car for an extended period, stop every 1-2 hours to stretch your legs. Keep a record of your medicines and medical history with you when traveling. °· Do not drive or operate heavy machinery while taking pain medicine. (narcotics). °· Do not cross your legs. °· Do not use any tobacco products including cigarettes, chewing tobacco, or electronic cigarettes. If you need help quitting, ask your health care provider. °· Do not take baths, swim, or use a hot  tub until your health care provider approves. Take showers once your health care provider approves. Pat incisions dry. Do not rub incisions with a washcloth or towel. °· Avoid climbing stairs and using the handrail to pull yourself up for the first 2-3 weeks after surgery. °· Return to work as directed by your health care provider. °· Drink enough fluid to keep your urine clear or pale yellow. °· Do not strain to have a bowel movement. Eat high-fiber foods if you become constipated. You may also take a medicine to help you have a bowel movement (laxative) as directed by your health care provider. °· Resume sexual activity as directed by your health care provider. Men should not use medicines for erectile dysfunction until their doctor says it is okay. °· If you had a certain type of heart condition in the past, you may need to take antibiotic medicine before having dental work or surgery. Let your dentist and health care providers know if you had one or more of the following: °¨ Previous endocarditis. °¨ An artificial (prosthetic) heart valve. °¨ Congenital heart disease. °SEEK MEDICAL CARE IF: °· You develop a skin rash.   °· You experience sudden changes in your weight. °· You have a fever. °  SEEK IMMEDIATE MEDICAL CARE IF:  °· You develop chest pain that is not coming from your incision. °· You have drainage (pus), redness, swelling, or pain at your incision site.   °· You develop shortness of breath or have difficulty breathing.   °· You have increased bleeding from your incision site.   °· You develop light-headedness.   °MAKE SURE YOU:  °· Understand these directions. °· Will watch your condition. °· Will get help right away if you are not doing well or get worse. °Document Released: 10/31/2004 Document Revised: 08/29/2013 Document Reviewed: 01/27/2012 °ExitCare® Patient Information ©2015 [employer]. This information is not intended to replace advice given to you by your health care provider. Make sure you  discuss any questions you have with your health care provider. ° ° ° °

## 2014-11-21 NOTE — Progress Notes (Signed)
Discussed ed with pt, husband, and daughter. Voiced understanding and requests a referral be sent to [facility]. Pt also requesting a RW. Notified CM.  [place] CES, ACSM 1:56 PM 11/21/2014

## 2014-11-22 ENCOUNTER — Telehealth: Payer: Self-pay

## 2014-11-22 NOTE — Telephone Encounter (Signed)
Pt is on TCM list. Admission due to cardiac surgery. Pt to follow up with cardiology.

## 2014-11-28 ENCOUNTER — Ambulatory Visit: Payer: Self-pay

## 2014-11-28 DIAGNOSIS — Z4802 Encounter for removal of sutures: Secondary | ICD-10-CM

## 2014-11-28 DIAGNOSIS — Z954 Presence of other heart-valve replacement: Secondary | ICD-10-CM

## 2014-11-28 NOTE — Progress Notes (Signed)
Removed 2 sutures from chest tube sites with no signs of infection and patient tolerated well.

## 2014-11-29 ENCOUNTER — Encounter: Payer: Medicare Other | Admitting: Internal Medicine

## 2014-12-04 ENCOUNTER — Other Ambulatory Visit: Payer: Self-pay | Admitting: Physician Assistant

## 2014-12-14 ENCOUNTER — Encounter (HOSPITAL_COMMUNITY)
Admission: RE | Admit: 2014-12-14 | Discharge: 2014-12-14 | Disposition: A | Discharge status: Home / Self Care | Payer: Medicare Other | Source: Ambulatory Visit | Attending: Cardiovascular Disease | Admitting: Cardiovascular Disease

## 2014-12-14 DIAGNOSIS — Z48812 Encounter for surgical aftercare following surgery on the circulatory system: Secondary | ICD-10-CM | POA: Insufficient documentation

## 2014-12-14 DIAGNOSIS — Z952 Presence of prosthetic heart valve: Secondary | ICD-10-CM | POA: Insufficient documentation

## 2014-12-14 NOTE — Progress Notes (Signed)
Cardiac Rehab Medication Review by a Pharmacist  Does the patient  feel that his/her medications are working for him/her?  yes  Has the patient been experiencing any side effects to the medications prescribed?  no  Does the patient measure his/her own blood pressure or blood glucose at home?  yes   Does the patient have any problems obtaining medications due to transportation or finances?   no  Understanding of regimen: fair Understanding of indications: fair Potential of compliance: excellent    Pharmacist comments: Pt has RN that comes ~every 2 weeks to set up medications. Pt wants to ask MD about removing many of the medication (lasix/KCl/Iron). In general would say pt is confused about medications.  Darl Pikes, PharmD Clinical Pharmacist- Resident Pager: [phone]     Darl Pikes 12/14/2014 9:02 AM

## 2014-12-15 ENCOUNTER — Encounter: Payer: Self-pay | Admitting: Cardiovascular Disease

## 2014-12-15 ENCOUNTER — Ambulatory Visit (INDEPENDENT_AMBULATORY_CARE_PROVIDER_SITE_OTHER): Payer: Medicare Other | Admitting: Cardiovascular Disease

## 2014-12-15 VITALS — BP 100/70 | HR 60 | Ht 64.5 in | Wt 151.4 lb

## 2014-12-15 DIAGNOSIS — I359 Nonrheumatic aortic valve disorder, unspecified: Secondary | ICD-10-CM

## 2014-12-15 DIAGNOSIS — I350 Nonrheumatic aortic (valve) stenosis: Secondary | ICD-10-CM

## 2014-12-15 DIAGNOSIS — E785 Hyperlipidemia, unspecified: Secondary | ICD-10-CM

## 2014-12-15 MED ORDER — ASPIRIN EC 81 MG PO TBEC: 81.0000 mg | DELAYED_RELEASE_TABLET | Freq: Every day | ORAL | Status: DC

## 2014-12-15 NOTE — Patient Instructions (Addendum)
Medication Instructions:  Your physician has recommended you make the following change in your medication:  1. STOP B12 2. STOP Lasix (Furosemide) 2. START Aspirin '81mg'$  take one by mouth daily  Labwork: Your physician recommends that you have lab work today: BMP and CBC  Testing/Procedures: Your physician has requested that you have an echocardiogram in 3 MONTHS. Echocardiography is a painless test that uses sound waves to create images of your heart. It provides your doctor with information about the size and shape of your heart and how well your heart's chambers and valves are working. This procedure takes approximately one hour. There are no restrictions for this procedure.  Follow-Up: Your physician recommends that you schedule a follow-up appointment in: 3 MONTHS with Dr Burt Knack   Any Other Special Instructions Will Be Listed Below (If Applicable).

## 2014-12-15 NOTE — Progress Notes (Signed)
Cardiology Office Note   Date:  12/15/2014   ID:  [name], DOB 25-Jun-1932, MRN [id number]  PCP:  Gwendolyn Grant, MD  Cardiologist:  [name], MD    Chief Complaint  Patient presents with  . Aortic Stenosis     History of Present Illness: [name] is a 81 y.o. female who presents for follow-up evaluation after undergoing aortic valve replacement 11/16/2014. The patient had developed severe bicuspid aortic stenosis. She also has been noted to have bileaflet mitral valve prolapse with mild mitral regurgitation. Because of progressive aortic stenosis with a mean gradient of 60 mmHg, we elected to move forward with surgical management. She underwent cardiac catheterization demonstrating widely patent coronary arteries. On July 21 she underwent aortic valve replacement using a 21 mm Edwards magna ease pericardial valve. She had transient atrial fibrillation postoperatively and required amiodarone. This was discontinued at discharge because of bradycardia. She has done quite well since returning home. She has a Systems developer who has been helping her manage her medications.  Overall she is feeling well. She denies significant shortness of breath or sternal pain. She is eager to return to normal activities. She has a follow-up scheduled with Dr. Cyndia Bent next week. She did have some fluid retention postoperatively and has been on furosemide. She reports no leg swelling in the last week.   Past Medical History  Diagnosis Date  . Hypercholesteremia   . Osteopenia   . Ulcer 2012    bleeding gastric Ulcer  . Breast cancer     right  . Aortic valve stenosis, severe   . Hiatal hernia   . Hypoglycemia     Past Surgical History  Procedure Laterality Date  . Breast surgery    . Tonsillectomy    . Parathyroidectomy    . Cosmetic surgery      face  . Cardiac catheterization N/A 10/18/2014    Procedure: Right/Left Heart Cath and Coronary Angiography;  Surgeon:  [name], MD;  Location: [facility];  Service: Cardiovascular;  Laterality: N/A;  . Aortic valve replacement N/A 11/16/2014    Procedure: AORTIC VALVE REPLACEMENT (AVR);  Surgeon: Gaye Pollack, MD;  Location: [facility];  Service: Open Heart Surgery;  Laterality: N/A;  . Tee without cardioversion N/A 11/16/2014    Procedure: TRANSESOPHAGEAL ECHOCARDIOGRAM (TEE);  Surgeon: Gaye Pollack, MD;  Location: [facility];  Service: Open Heart Surgery;  Laterality: N/A;    Current Outpatient Prescriptions  Medication Sig Dispense Refill  . acetaminophen (TYLENOL) 500 MG tablet Take 500-1,000 mg by mouth at bedtime as needed for mild pain or moderate pain.    Marland Kitchen ALPRAZolam (XANAX) 0.5 MG tablet Take 1 tablet (0.5 mg total) by mouth at bedtime. May take additional dose as needed for stress or abdominal pain related to IBS. 120 tablet 2  . Calcium Carb-Cholecalciferol (CALCIUM 600+D3) 600-400 MG-UNIT TABS Take 1 tablet by mouth daily.    . chloral hydrate (SOMNOTE) 500 MG capsule Take 1 capsule (500 mg total) by mouth at bedtime. Liquid formula: take 10 mL by mouth at bedtime as needed for sleep. 270 capsule 3  . Cholecalciferol (VITAMIN D-3) 1000 UNITS CAPS Take 1 capsule by mouth daily.     . Coenzyme Q10 (CO Q-10) 100 MG CAPS Take 1 capsule by mouth every morning.    . ferrous gluconate (FERGON) 324 MG tablet Take 1 tablet (324 mg total) by mouth daily with breakfast. For one month then stop. 30 tablet  0  . fluticasone (FLONASE) 50 MCG/ACT nasal spray Place 1 spray into both nostrils as needed for allergies.     Marland Kitchen HYDROcodone-homatropine (HYCODAN) 5-1.5 MG/5ML syrup Take 5 mLs by mouth every 8 (eight) hours as needed for cough. 120 mL 0  . LORazepam (ATIVAN) 0.5 MG tablet Take 0.5 mg by mouth as needed for anxiety.    . Magnesium 400 MG CAPS Take 400 mg by mouth every morning.     . metoprolol tartrate (LOPRESSOR) 25 MG tablet TAKE 1/2 TABLET BY MOUTH TWICE A DAY 30 tablet 0  . montelukast (SINGULAIR)  10 MG tablet Take 10 mg by mouth at bedtime.    Marland Kitchen Ophthalmic Irrigation Solution (OCUSOFT EYE [place] OP) Apply 1 application to eye 2 (two) times daily. Morning and before bed    . Polyethyl Glycol-Propyl Glycol (SYSTANE ULTRA PF) 0.4-0.3 % SOLN Apply 1 drop to eye 2 (two) times daily.    . Red Yeast Rice Extract ([employer] RED YEAST RICE PO) Take 600 mg by mouth daily.     Marland Kitchen senna-docusate (SENOKOT-S) 8.6-50 MG per tablet Take 1 tablet by mouth daily. With oral iron.    . Wheat Dextrin (BENEFIBER DRINK MIX PO) Take 5 mLs by mouth every morning. Mix with 1/2 a glass water, and takes with vitamins     No current facility-administered medications for this visit.    Allergies:   Amoxicillin-pot clavulanate; Penicillins; and Sulfonamide derivatives   Social History:  The patient  reports that she has quit smoking. Her smoking use included Cigarettes. She has a 15 pack-year smoking history. She has never used smokeless tobacco. She reports that she does not use illicit drugs.   Family History:  The patient's family history includes Hypertension in her mother; Kidney disease in her father; Rectal cancer in her mother.    ROS:  Please see the history of present illness. All other systems are reviewed and negative.    PHYSICAL EXAM: VS:  BP 100/70 mmHg  Pulse 60  Ht 5' 4.5" (1.638 m)  Wt 151 lb 6.4 oz (68.675 kg)  BMI 25.60 kg/m2 , BMI Body mass index is 25.6 kg/(m^2). GEN: Well nourished, well developed, in no acute distress HEENT: normal Neck: no JVD, no masses. No carotid bruits Cardiac: RRR with a soft systolic ejection murmur at the right upper sternal border          Respiratory:  clear to auscultation bilaterally, normal work of breathing GI: soft, nontender, nondistended, + BS MS: no deformity or atrophy Ext: no pretibial edema, pedal pulses 2+= bilaterally Skin: warm and dry, no rash Neuro:  Strength and sensation are intact Psych: euthymic mood, full affect  EKG:  EKG is ordered  today. The ekg ordered today shows normal sinus rhythm 60 bpm, left atrial enlargement, nonspecific T wave abnormality.  Recent Labs: 11/14/2014: ALT 16 11/17/2014: Magnesium 1.5* 11/19/2014: BUN 10; Creatinine, Ser 0.59; Potassium 4.0; Sodium 134* 11/20/2014: Hemoglobin 7.7*; Platelets 112*   Lipid Panel     Component Value Date/Time   CHOL 250* 12/01/2013 0955   TRIG 79.0 12/01/2013 0955   HDL 83.00 12/01/2013 0955   CHOLHDL 3 12/01/2013 0955   VLDL 15.8 12/01/2013 0955   LDLCALC 151* 12/01/2013 0955   LDLDIRECT 144.0 11/29/2012 1601      Wt Readings from Last 3 Encounters:  12/15/14 151 lb 6.4 oz (68.675 kg)  12/14/14 153 lb 14.1 oz (69.8 kg)  11/21/14 158 lb 11.7 oz (72 kg)  ASSESSMENT AND PLAN: Aortic valve disease s/p bioprosthetic AVR: the patient is doing very well in her early recovery phase from surgery. She is in sinus rhythm today and there has been no evidence of recurrent atrial fibrillation based on home vital signs.  I would like to see her back in 3 months with an echocardiogram for a postoperative baseline study.  I have reviewed her medicine list and recommended that she start daily aspirin. This was not given in the hospital because of thrombocytopenia. Otherwise recommended that she can discontinue vitamin B12 and furosemide. We'll check a CBC today and if her hemoglobin has normalized she should be able to stop ferrous gluconate.  Current medicines are reviewed with the patient today.  The patient does not have concerns regarding medicines.  Labs/ tests ordered today include:   Orders Placed This Encounter  Procedures  . CBC  . Basic Metabolic Panel (BMET)  . EKG 12-Lead  . Echocardiogram    Disposition:   FU 3 months with an echocardiogram prior to the visit  Signed, [name], MD  12/15/2014 4:49 PM    [address], [place],   [address] Phone: [phone]; Fax: [phone]

## 2014-12-16 LAB — CBC
HEMATOCRIT: 36.4 % (ref 36.0–46.0)
Hemoglobin: 11.9 g/dL — ABNORMAL LOW (ref 12.0–15.0)
MCH: 31.6 pg (ref 26.0–34.0)
MCHC: 32.7 g/dL (ref 30.0–36.0)
MCV: 96.6 fL (ref 78.0–100.0)
MPV: 11.5 fL (ref 8.6–12.4)
Platelets: 246 10*3/uL (ref 150–400)
RBC: 3.77 MIL/uL — ABNORMAL LOW (ref 3.87–5.11)
RDW: 14.1 % (ref 11.5–15.5)
WBC: 5.3 10*3/uL (ref 4.0–10.5)

## 2014-12-16 LAB — BASIC METABOLIC PANEL
BUN: 19 mg/dL (ref 7–25)
CHLORIDE: 101 mmol/L (ref 98–110)
CO2: 25 mmol/L (ref 20–31)
Calcium: 9.2 mg/dL (ref 8.6–10.4)
Creat: 0.77 mg/dL (ref 0.60–0.93)
GLUCOSE: 65 mg/dL (ref 65–99)
Potassium: 4.2 mmol/L (ref 3.5–5.3)
Sodium: 137 mmol/L (ref 135–146)

## 2014-12-18 ENCOUNTER — Telehealth: Payer: Self-pay | Admitting: *Deleted

## 2014-12-18 DIAGNOSIS — R05 Cough: Secondary | ICD-10-CM

## 2014-12-18 DIAGNOSIS — R059 Cough, unspecified: Secondary | ICD-10-CM

## 2014-12-18 MED ORDER — HYDROCODONE-HOMATROPINE 5-1.5 MG/5ML PO SYRP: 5.0000 mL | ORAL_SOLUTION | Freq: Three times a day (TID) | ORAL | Status: DC | PRN

## 2014-12-18 NOTE — Telephone Encounter (Signed)
OK X1 

## 2014-12-18 NOTE — Telephone Encounter (Signed)
Notified pt md ok refill has been call to [facility] spoke with pharmacist Santiago Glad...[name]

## 2014-12-18 NOTE — Telephone Encounter (Signed)
Pt states she had open heart surgery about 4-5 weeks ago. She is still experiencing a dry cough. Wanting to see will md refill Hycodan cough syrup that he rx...[name]

## 2014-12-19 ENCOUNTER — Other Ambulatory Visit: Payer: Self-pay | Admitting: Surgery

## 2014-12-19 DIAGNOSIS — Z952 Presence of prosthetic heart valve: Secondary | ICD-10-CM

## 2014-12-19 MED ORDER — HYDROCODONE-HOMATROPINE 5-1.5 MG/5ML PO SYRP: 5.0000 mL | ORAL_SOLUTION | Freq: Three times a day (TID) | ORAL | Status: DC | PRN

## 2014-12-19 NOTE — Telephone Encounter (Signed)
Called [facility] to verify msg tech stated they could not take call-in rx for hycodan because its a schedule II. Called pt inform her she will gave to pick rx up from the office then take to [facility].../LMB

## 2014-12-19 NOTE — Addendum Note (Signed)
Addended by: Earnstine Regal on: 12/19/2014 12:06 PM   Modules accepted: Orders

## 2014-12-19 NOTE — Telephone Encounter (Signed)
Pharmacy called stating the prescription can't be called in, but can be sent by e-script

## 2014-12-20 ENCOUNTER — Ambulatory Visit (INDEPENDENT_AMBULATORY_CARE_PROVIDER_SITE_OTHER): Payer: Self-pay | Admitting: Surgery

## 2014-12-20 ENCOUNTER — Ambulatory Visit
Admission: RE | Admit: 2014-12-20 | Discharge: 2014-12-20 | Disposition: A | Discharge status: Home / Self Care | Payer: Medicare Other | Source: Ambulatory Visit | Attending: Surgery | Admitting: Surgery

## 2014-12-20 ENCOUNTER — Encounter: Payer: Self-pay | Admitting: Surgery

## 2014-12-20 VITALS — BP 116/67 | HR 56 | Resp 20 | Ht 64.5 in | Wt 150.0 lb

## 2014-12-20 DIAGNOSIS — Z952 Presence of prosthetic heart valve: Secondary | ICD-10-CM | POA: Diagnosis not present

## 2014-12-20 DIAGNOSIS — Z954 Presence of other heart-valve replacement: Secondary | ICD-10-CM

## 2014-12-20 DIAGNOSIS — I350 Nonrheumatic aortic (valve) stenosis: Secondary | ICD-10-CM

## 2014-12-20 NOTE — Progress Notes (Signed)
HPI: Patient returns for routine postoperative follow-up having undergone AVR with a 21 mm pericardial valve on 11/16/2014. The patient's early postoperative recovery while in the hospital was notable for an uncomplicated postop course. Since hospital discharge the patient reports that she has been feeling well and is walking without chest pain or shortness of breath.   Current Outpatient Prescriptions  Medication Sig Dispense Refill  . acetaminophen (TYLENOL) 500 MG tablet Take 500-1,000 mg by mouth at bedtime as needed for mild pain or moderate pain.    Marland Kitchen ALPRAZolam (XANAX) 0.5 MG tablet Take 1 tablet (0.5 mg total) by mouth at bedtime. May take additional dose as needed for stress or abdominal pain related to IBS. 120 tablet 2  . aspirin EC 81 MG tablet Take 1 tablet (81 mg total) by mouth daily.    . Calcium Carb-Cholecalciferol (CALCIUM 600+D3) 600-400 MG-UNIT TABS Take 1 tablet by mouth daily.    . chloral hydrate (SOMNOTE) 500 MG capsule Take 1 capsule (500 mg total) by mouth at bedtime. Liquid formula: take 10 mL by mouth at bedtime as needed for sleep. 270 capsule 3  . Cholecalciferol (VITAMIN D-3) 1000 UNITS CAPS Take 1 capsule by mouth daily.     . Coenzyme Q10 (CO Q-10) 100 MG CAPS Take 1 capsule by mouth every morning.    . ferrous gluconate (FERGON) 324 MG tablet Take 1 tablet (324 mg total) by mouth daily with breakfast. For one month then stop. 30 tablet 0  . fluticasone (FLONASE) 50 MCG/ACT nasal spray Place 1 spray into both nostrils as needed for allergies.     Marland Kitchen HYDROcodone-homatropine (HYCODAN) 5-1.5 MG/5ML syrup Take 5 mLs by mouth every 8 (eight) hours as needed for cough. 120 mL 0  . LORazepam (ATIVAN) 0.5 MG tablet Take 0.5 mg by mouth as needed for anxiety.    . Magnesium 400 MG CAPS Take 400 mg by mouth every morning.     . metoprolol tartrate (LOPRESSOR) 25 MG tablet TAKE 1/2 TABLET BY MOUTH TWICE A DAY 30 tablet 0  . montelukast (SINGULAIR) 10 MG tablet Take  10 mg by mouth at bedtime.    Marland Kitchen Ophthalmic Irrigation Solution (OCUSOFT EYE [place] OP) Apply 1 application to eye 2 (two) times daily. Morning and before bed    . Polyethyl Glycol-Propyl Glycol (SYSTANE ULTRA PF) 0.4-0.3 % SOLN Apply 1 drop to eye 2 (two) times daily.    . Red Yeast Rice Extract (CVS RED YEAST RICE PO) Take 600 mg by mouth daily.     Marland Kitchen senna-docusate (SENOKOT-S) 8.6-50 MG per tablet Take 1 tablet by mouth daily. With oral iron.    . Wheat Dextrin (BENEFIBER DRINK MIX PO) Take 5 mLs by mouth every morning. Mix with 1/2 a glass water, and takes with vitamins     No current facility-administered medications for this visit.    Physical Exam: BP 116/67 mmHg  Pulse 56  Resp 20  Ht 5' 4.5" (1.638 m)  Wt 150 lb (68.04 kg)  BMI 25.36 kg/m2  SpO2 98% She looks well. Lung exam is clear. Cardiac exam shows a regular rate and rhythm with normal heart sounds. Chest incision is healing well and sternum is stable. There is no peripheral edema.   Diagnostic Tests:  CLINICAL DATA: Status post aortic valve replacement.  EXAM: CHEST 2 VIEW  COMPARISON: November 19, 2014.  FINDINGS: Stable mild cardiomegaly. Status post aortic valve replacement. No pneumothorax or pleural effusion is noted. Both  lungs are clear. The visualized skeletal structures are unremarkable.  IMPRESSION: No active cardiopulmonary disease.   Electronically Signed  By: Marijo Conception, M.D.  On: 12/20/2014 10:24   Impression:  Overall I think she is doing well. I encouraged her to continue walking. She is planning to participate in cardiac rehab. I told her that she  could drive her car but should not lift anything heavier than 10 lbs for three months postop.   Plan:  She is going to follow up with Dr. Burt Knack in a couple months and will have an echo at that time.   Gaye Pollack, MD [facility] [phone]

## 2014-12-25 ENCOUNTER — Encounter (HOSPITAL_COMMUNITY)
Admission: RE | Admit: 2014-12-25 | Discharge: 2014-12-25 | Disposition: A | Discharge status: Home / Self Care | Payer: Medicare Other | Source: Ambulatory Visit | Attending: Cardiovascular Disease | Admitting: Cardiovascular Disease

## 2014-12-25 DIAGNOSIS — Z952 Presence of prosthetic heart valve: Secondary | ICD-10-CM | POA: Diagnosis not present

## 2014-12-25 DIAGNOSIS — Z48812 Encounter for surgical aftercare following surgery on the circulatory system: Secondary | ICD-10-CM | POA: Diagnosis not present

## 2014-12-25 NOTE — Progress Notes (Signed)
Pt started cardiac rehab today.  Pt tolerated light exercise without difficulty. VSS, telemetry-Sinus Brady 51, asymptomatic.Carmina's heart rate was noted in the 50's to 60's today with exercise and resting. Danna Hefty NP called and notified. No new orders received. Will continue to monitor the patient throughout  the program.  Medication list reconciled.  Pt verbalized compliance with medications and denies barriers to compliance. PSYCHOSOCIAL ASSESSMENT:  PHQ-0. Pt exhibits positive coping skills, hopeful outlook with supportive family. No psychosocial needs identified at this time, no psychosocial interventions necessary.    Pt enjoys playing bridge.   Pt cardiac rehab  goal is  to feel better and keep moving forward.  Pt encouraged to participate in exercising on your own to increase ability to achieve these goals.   Pt long term cardiac rehab goal is walk further and to get back to playing tennis.  Pt oriented to exercise equipment and routine.  Understanding verbalized.

## 2014-12-27 ENCOUNTER — Encounter (HOSPITAL_COMMUNITY)
Admission: RE | Admit: 2014-12-27 | Discharge: 2014-12-27 | Disposition: A | Discharge status: Home / Self Care | Payer: Medicare Other | Source: Ambulatory Visit | Attending: Cardiovascular Disease | Admitting: Cardiovascular Disease

## 2014-12-27 DIAGNOSIS — Z48812 Encounter for surgical aftercare following surgery on the circulatory system: Secondary | ICD-10-CM | POA: Diagnosis not present

## 2014-12-27 DIAGNOSIS — Z952 Presence of prosthetic heart valve: Secondary | ICD-10-CM | POA: Diagnosis not present

## 2014-12-29 ENCOUNTER — Encounter (HOSPITAL_COMMUNITY)
Admission: RE | Admit: 2014-12-29 | Discharge: 2014-12-29 | Disposition: A | Discharge status: Home / Self Care | Payer: Medicare Other | Source: Ambulatory Visit | Attending: Cardiovascular Disease | Admitting: Cardiovascular Disease

## 2014-12-29 DIAGNOSIS — Z48812 Encounter for surgical aftercare following surgery on the circulatory system: Secondary | ICD-10-CM | POA: Insufficient documentation

## 2014-12-29 DIAGNOSIS — Z952 Presence of prosthetic heart valve: Secondary | ICD-10-CM | POA: Diagnosis not present

## 2015-01-02 ENCOUNTER — Other Ambulatory Visit: Payer: Self-pay | Admitting: Surgery

## 2015-01-03 ENCOUNTER — Encounter (HOSPITAL_COMMUNITY)
Admission: RE | Admit: 2015-01-03 | Discharge: 2015-01-03 | Disposition: A | Discharge status: Home / Self Care | Payer: Medicare Other | Source: Ambulatory Visit | Attending: Cardiovascular Disease | Admitting: Cardiovascular Disease

## 2015-01-03 DIAGNOSIS — Z48812 Encounter for surgical aftercare following surgery on the circulatory system: Secondary | ICD-10-CM | POA: Diagnosis not present

## 2015-01-03 DIAGNOSIS — Z952 Presence of prosthetic heart valve: Secondary | ICD-10-CM | POA: Diagnosis not present

## 2015-01-03 NOTE — Progress Notes (Signed)
Reviewed home exercise guidelines with patient including endpoints, temperature precautions, target heart rate and rate of perceived exertion. Pt is walking 20 minutes daily as her mode of home exercise. Encouraged pt to increase walking duration to 30 minutes/day, and pt is amenable to this. Pt voices understanding of instructions given. Sol Passer, MS, ACSM CCEP

## 2015-01-04 ENCOUNTER — Other Ambulatory Visit: Payer: Self-pay | Admitting: *Deleted

## 2015-01-04 MED ORDER — METOPROLOL TARTRATE 25 MG PO TABS: 12.5000 mg | ORAL_TABLET | Freq: Two times a day (BID) | ORAL | Status: DC

## 2015-01-05 ENCOUNTER — Encounter (HOSPITAL_COMMUNITY)
Admission: RE | Admit: 2015-01-05 | Discharge: 2015-01-05 | Disposition: A | Discharge status: Home / Self Care | Payer: Medicare Other | Source: Ambulatory Visit | Attending: Cardiovascular Disease | Admitting: Cardiovascular Disease

## 2015-01-05 DIAGNOSIS — Z48812 Encounter for surgical aftercare following surgery on the circulatory system: Secondary | ICD-10-CM | POA: Diagnosis not present

## 2015-01-05 DIAGNOSIS — Z952 Presence of prosthetic heart valve: Secondary | ICD-10-CM | POA: Diagnosis not present

## 2015-01-05 NOTE — Progress Notes (Signed)
[name]  Did not complete her quality of life questionnaire. [name] said she did not feel that the questions were relevant.

## 2015-01-08 ENCOUNTER — Encounter (HOSPITAL_COMMUNITY)
Admission: RE | Admit: 2015-01-08 | Discharge: 2015-01-08 | Disposition: A | Discharge status: Home / Self Care | Payer: Medicare Other | Source: Ambulatory Visit | Attending: Cardiovascular Disease | Admitting: Cardiovascular Disease

## 2015-01-08 DIAGNOSIS — Z952 Presence of prosthetic heart valve: Secondary | ICD-10-CM | POA: Diagnosis not present

## 2015-01-08 DIAGNOSIS — Z48812 Encounter for surgical aftercare following surgery on the circulatory system: Secondary | ICD-10-CM | POA: Diagnosis not present

## 2015-01-09 ENCOUNTER — Telehealth: Payer: Self-pay | Admitting: Cardiovascular Disease

## 2015-01-09 NOTE — Telephone Encounter (Signed)
I spoke with the pt and she is planning a trip to [place] in November.  The flight will be an hour and a half.  I made her aware that she is okay to travel and that she should try to get up at least once during the flight to help decrease risk of DVT.  Pt agreed with plan.

## 2015-01-09 NOTE — Telephone Encounter (Signed)
Left message on machine for pt to contact the office.   

## 2015-01-09 NOTE — Telephone Encounter (Signed)
New Message  Pt called requests a call back to determine if she can fly out of town in November. If so she will need the approval of Dr. Burt Knack.

## 2015-01-10 ENCOUNTER — Encounter (HOSPITAL_COMMUNITY)
Admission: RE | Admit: 2015-01-10 | Discharge: 2015-01-10 | Disposition: A | Discharge status: Home / Self Care | Payer: Medicare Other | Source: Ambulatory Visit | Attending: Cardiovascular Disease | Admitting: Cardiovascular Disease

## 2015-01-10 DIAGNOSIS — Z952 Presence of prosthetic heart valve: Secondary | ICD-10-CM | POA: Diagnosis not present

## 2015-01-10 DIAGNOSIS — Z48812 Encounter for surgical aftercare following surgery on the circulatory system: Secondary | ICD-10-CM | POA: Diagnosis not present

## 2015-01-12 ENCOUNTER — Encounter (HOSPITAL_COMMUNITY): Payer: Medicare Other

## 2015-01-15 ENCOUNTER — Encounter (HOSPITAL_COMMUNITY): Payer: Medicare Other

## 2015-01-15 DIAGNOSIS — H52203 Unspecified astigmatism, bilateral: Secondary | ICD-10-CM | POA: Diagnosis not present

## 2015-01-15 DIAGNOSIS — H5203 Hypermetropia, bilateral: Secondary | ICD-10-CM | POA: Diagnosis not present

## 2015-01-15 DIAGNOSIS — H2513 Age-related nuclear cataract, bilateral: Secondary | ICD-10-CM | POA: Diagnosis not present

## 2015-01-17 ENCOUNTER — Encounter (HOSPITAL_COMMUNITY)
Admission: RE | Admit: 2015-01-17 | Discharge: 2015-01-17 | Disposition: A | Discharge status: Home / Self Care | Payer: Medicare Other | Source: Ambulatory Visit | Attending: Cardiovascular Disease | Admitting: Cardiovascular Disease

## 2015-01-17 DIAGNOSIS — Z952 Presence of prosthetic heart valve: Secondary | ICD-10-CM | POA: Diagnosis not present

## 2015-01-17 DIAGNOSIS — Z48812 Encounter for surgical aftercare following surgery on the circulatory system: Secondary | ICD-10-CM | POA: Diagnosis not present

## 2015-01-17 NOTE — Progress Notes (Signed)
[name] 81 y.o. female Nutrition Note Spoke with pt.  Nutrition Survey reviewed with pt. Pt is following Step 2 of the Therapeutic Lifestyle Changes diet. Age-appropriate nutrition recommendations discussed. Pt wants to lose wt. Pt has been trying to lose wt by watching her portion sizes. Wt loss tips reviewed. Pt expressed understanding of the information reviewed. Pt aware of nutrition education classes offered. Lab Results  Component Value Date   HGBA1C 5.8* 11/14/2014   Wt Readings from Last 3 Encounters:  12/20/14 150 lb (68.04 kg)  12/15/14 151 lb 6.4 oz (68.675 kg)  12/14/14 153 lb 14.1 oz (69.8 kg)   Nutrition Diagnosis ? Food-and nutrition-related knowledge deficit related to lack of exposure to information as related to diagnosis of: ? CVD ? Pre-DM  Nutrition Intervention ? Benefits of adopting Therapeutic Lifestyle Changes discussed when Medficts reviewed. ? Pt to attend the Portion Distortion class ? Pt to attend the  ? Nutrition I class - met; 01/02/15                     ? Nutrition II class ? Pt given handouts for: ? Nutrition II class ? Continue client-centered nutrition education by RD, as part of interdisciplinary care.  Goal(s) ? Pt to identify food quantities necessary to achieve: ? wt loss to a goal wt of 148-150 lb (67.1- 68.2 kg) at graduation from cardiac rehab.  ? Pt to describe the benefit of including fruits, vegetables, whole grains, and low-fat dairy products in a heart healthy meal plan.  Monitor and Evaluate progress toward nutrition goal with team.  Derek Mound, M.Ed, RD, LDN, CDE 01/17/2015 10:49 AM

## 2015-01-19 ENCOUNTER — Encounter (HOSPITAL_COMMUNITY)
Admission: RE | Admit: 2015-01-19 | Discharge: 2015-01-19 | Disposition: A | Discharge status: Home / Self Care | Payer: Medicare Other | Source: Ambulatory Visit | Attending: Cardiovascular Disease | Admitting: Cardiovascular Disease

## 2015-01-19 DIAGNOSIS — Z952 Presence of prosthetic heart valve: Secondary | ICD-10-CM | POA: Diagnosis not present

## 2015-01-19 DIAGNOSIS — Z48812 Encounter for surgical aftercare following surgery on the circulatory system: Secondary | ICD-10-CM | POA: Diagnosis not present

## 2015-01-22 ENCOUNTER — Encounter (HOSPITAL_COMMUNITY)
Admission: RE | Admit: 2015-01-22 | Discharge: 2015-01-22 | Disposition: A | Discharge status: Home / Self Care | Payer: Medicare Other | Source: Ambulatory Visit | Attending: Cardiovascular Disease | Admitting: Cardiovascular Disease

## 2015-01-22 DIAGNOSIS — Z952 Presence of prosthetic heart valve: Secondary | ICD-10-CM | POA: Diagnosis not present

## 2015-01-22 DIAGNOSIS — Z48812 Encounter for surgical aftercare following surgery on the circulatory system: Secondary | ICD-10-CM | POA: Diagnosis not present

## 2015-01-23 ENCOUNTER — Encounter: Payer: Self-pay | Admitting: Cardiovascular Disease

## 2015-01-23 ENCOUNTER — Ambulatory Visit (INDEPENDENT_AMBULATORY_CARE_PROVIDER_SITE_OTHER): Payer: Medicare Other

## 2015-01-23 DIAGNOSIS — Z23 Encounter for immunization: Secondary | ICD-10-CM

## 2015-01-24 ENCOUNTER — Encounter (HOSPITAL_COMMUNITY)
Admission: RE | Admit: 2015-01-24 | Discharge: 2015-01-24 | Disposition: A | Discharge status: Home / Self Care | Payer: Medicare Other | Source: Ambulatory Visit | Attending: Cardiovascular Disease | Admitting: Cardiovascular Disease

## 2015-01-24 DIAGNOSIS — Z48812 Encounter for surgical aftercare following surgery on the circulatory system: Secondary | ICD-10-CM | POA: Diagnosis not present

## 2015-01-24 DIAGNOSIS — Z952 Presence of prosthetic heart valve: Secondary | ICD-10-CM | POA: Diagnosis not present

## 2015-01-26 ENCOUNTER — Encounter (HOSPITAL_COMMUNITY)
Admission: RE | Admit: 2015-01-26 | Discharge: 2015-01-26 | Disposition: A | Discharge status: Home / Self Care | Payer: Medicare Other | Source: Ambulatory Visit | Attending: Cardiovascular Disease | Admitting: Cardiovascular Disease

## 2015-01-26 DIAGNOSIS — Z952 Presence of prosthetic heart valve: Secondary | ICD-10-CM | POA: Diagnosis not present

## 2015-01-26 DIAGNOSIS — Z48812 Encounter for surgical aftercare following surgery on the circulatory system: Secondary | ICD-10-CM | POA: Diagnosis not present

## 2015-01-29 ENCOUNTER — Encounter (HOSPITAL_COMMUNITY): Payer: Medicare Other

## 2015-01-30 ENCOUNTER — Encounter: Payer: Self-pay | Admitting: Internal Medicine

## 2015-01-30 ENCOUNTER — Ambulatory Visit (INDEPENDENT_AMBULATORY_CARE_PROVIDER_SITE_OTHER): Payer: Medicare Other | Admitting: Internal Medicine

## 2015-01-30 VITALS — BP 100/60 | HR 71 | Temp 98.3°F | Ht 64.5 in | Wt 153.4 lb

## 2015-01-30 DIAGNOSIS — J0120 Acute ethmoidal sinusitis, unspecified: Secondary | ICD-10-CM | POA: Insufficient documentation

## 2015-01-30 NOTE — Progress Notes (Signed)
Pre visit review using our clinic review tool, if applicable. No additional management support is needed unless otherwise documented below in the visit note. 

## 2015-01-30 NOTE — Patient Instructions (Signed)
Add mucinex (do not take mucinex D)  Continue flonase  Take tylenol as needed  You can take advil with food   Try salt water gargles for the sore throat  Increase rest, fluids  If your symptoms do not improve over the next two days call back  Sinusitis Sinusitis is redness, soreness, and inflammation of the paranasal sinuses. Paranasal sinuses are air pockets within the bones of your face (beneath the eyes, the middle of the forehead, or above the eyes). In healthy paranasal sinuses, mucus is able to drain out, and air is able to circulate through them by way of your nose. However, when your paranasal sinuses are inflamed, mucus and air can become trapped. This can allow bacteria and other germs to grow and cause infection. Sinusitis can develop quickly and last only a short time (acute) or continue over a long period (chronic). Sinusitis that lasts for more than 12 weeks is considered chronic.  CAUSES  Causes of sinusitis include:  Allergies.  Structural abnormalities, such as displacement of the cartilage that separates your nostrils (deviated septum), which can decrease the air flow through your nose and sinuses and affect sinus drainage.  Functional abnormalities, such as when the small hairs (cilia) that line your sinuses and help remove mucus do not work properly or are not present. SIGNS AND SYMPTOMS  Symptoms of acute and chronic sinusitis are the same. The primary symptoms are pain and pressure around the affected sinuses. Other symptoms include:  Upper toothache.  Earache.  Headache.  Bad breath.  Decreased sense of smell and taste.  A cough, which worsens when you are lying flat.  Fatigue.  Fever.  Thick drainage from your nose, which often is green and may contain pus (purulent).  Swelling and warmth over the affected sinuses. DIAGNOSIS  Your health care provider will perform a physical exam. During the exam, your health care provider may:  Look in your  nose for signs of abnormal growths in your nostrils (nasal polyps).  Tap over the affected sinus to check for signs of infection.  View the inside of your sinuses (endoscopy) using an imaging device that has a light attached (endoscope). If your health care provider suspects that you have chronic sinusitis, one or more of the following tests may be recommended:  Allergy tests.  Nasal culture. A sample of mucus is taken from your nose, sent to a lab, and screened for bacteria.  Nasal cytology. A sample of mucus is taken from your nose and examined by your health care provider to determine if your sinusitis is related to an allergy. TREATMENT  Most cases of acute sinusitis are related to a viral infection and will resolve on their own within 10 days. Sometimes medicines are prescribed to help relieve symptoms (pain medicine, decongestants, nasal steroid sprays, or saline sprays).  However, for sinusitis related to a bacterial infection, your health care provider will prescribe antibiotic medicines. These are medicines that will help kill the bacteria causing the infection.  Rarely, sinusitis is caused by a fungal infection. In theses cases, your health care provider will prescribe antifungal medicine. For some cases of chronic sinusitis, surgery is needed. Generally, these are cases in which sinusitis recurs more than 3 times per year, despite other treatments. HOME CARE INSTRUCTIONS   Drink plenty of water. Water helps thin the mucus so your sinuses can drain more easily.  Use a humidifier.  Inhale steam 3 to 4 times a day (for example, sit in the bathroom  with the shower running).  Apply a warm, moist washcloth to your face 3 to 4 times a day, or as directed by your health care provider.  Use saline nasal sprays to help moisten and clean your sinuses.  Take medicines only as directed by your health care provider.  If you were prescribed either an antibiotic or antifungal medicine,  finish it all even if you start to feel better. SEEK IMMEDIATE MEDICAL CARE IF:  You have increasing pain or severe headaches.  You have nausea, vomiting, or drowsiness.  You have swelling around your face.  You have vision problems.  You have a stiff neck.  You have difficulty breathing. MAKE SURE YOU:   Understand these instructions.  Will watch your condition.  Will get help right away if you are not doing well or get worse. Document Released: 04/14/2005 Document Revised: 08/29/2013 Document Reviewed: 04/29/2011 [facility] Patient Information 2015 [place], [place]. This information is not intended to replace advice given to you by your health care provider. Make sure you discuss any questions you have with your health care provider.

## 2015-01-30 NOTE — Progress Notes (Signed)
   Subjective:    Patient ID: [name], female    DOB: [birth year], 81 y.o.   MRN: [id number]  HPI She is here for an acute visit for cold symptoms.  Her symptoms started three days ago and include fever last night of 101, decreased appetite, fatigue, congestion, ear pain, sinus pressure, sore throat, swollen neck glands, headaches and muscle aches.  She has been taking flonase and she does not feel it has helped. She took one tylenol last night.  She does have allergies and takes singular.  She has not had a sinus infection in a long time.   Medications and allergies reviewed with patient and updated if appropriate.   Review of Systems  Constitutional: Positive for fever, appetite change and fatigue.  HENT: Positive for congestion, ear pain, sinus pressure and sore throat.   Respiratory: Negative for cough, shortness of breath and wheezing.   Cardiovascular: Negative for chest pain.  Gastrointestinal: Negative for nausea, abdominal pain and diarrhea.  Musculoskeletal: Positive for myalgias.  Neurological: Positive for headaches. Negative for dizziness and light-headedness.       Objective:   Physical Exam  Constitutional: She appears well-developed and well-nourished. No distress.  HENT:  Head: Normocephalic and atraumatic.  Right Ear: External ear normal.  Left Ear: External ear normal.  Nose: Nose normal.  Mouth/Throat: Oropharynx is clear and moist. No oropharyngeal exudate.  No pain overlying maxillary sinuses  Eyes: Conjunctivae are normal.  Neck: Neck supple. No thyromegaly present.  Cardiovascular: Normal rate and regular rhythm.   Murmur (2/6 systolic) heard. Pulmonary/Chest: Effort normal and breath sounds normal. No respiratory distress. She has no wheezes.  Lymphadenopathy:    She has cervical adenopathy.  Skin: She is not diaphoretic.      Filed Vitals:   01/30/15 0904  BP: 100/60  Pulse: 71  Temp: 98.3 F (36.8 C)       Assessment & Plan:    See problem list

## 2015-01-30 NOTE — Assessment & Plan Note (Signed)
Acute sinus infection, likely viral Continue singular, flonase Add mucinex Take tylenol and/or advil for sinus pain/ear pain - advised to take advil with food - stop if stomach upset Salt water gargles for sore throat Increase rest, fluids Call if no improvement over the next two days or if symptoms worsen

## 2015-01-31 ENCOUNTER — Encounter (HOSPITAL_COMMUNITY)
Admission: RE | Admit: 2015-01-31 | Discharge: 2015-01-31 | Disposition: A | Discharge status: Home / Self Care | Payer: Medicare Other | Source: Ambulatory Visit | Attending: Cardiovascular Disease | Admitting: Cardiovascular Disease

## 2015-01-31 DIAGNOSIS — Z952 Presence of prosthetic heart valve: Secondary | ICD-10-CM | POA: Insufficient documentation

## 2015-01-31 DIAGNOSIS — Z48812 Encounter for surgical aftercare following surgery on the circulatory system: Secondary | ICD-10-CM | POA: Diagnosis not present

## 2015-02-01 ENCOUNTER — Telehealth: Payer: Self-pay | Admitting: Internal Medicine

## 2015-02-01 MED ORDER — DOXYCYCLINE HYCLATE 100 MG PO TABS: 100.0000 mg | ORAL_TABLET | Freq: Two times a day (BID) | ORAL | Status: DC

## 2015-02-01 NOTE — Telephone Encounter (Signed)
Patient called in.  I notified her that her script was sent.

## 2015-02-01 NOTE — Telephone Encounter (Signed)
Pt was seen on Tues and her symptoms are no better. She's having fevers on and off and she feels she has a sinus infection now. She was told if she didn't approve you would send in an antibiotic for her.  Pharmacy is [facility]

## 2015-02-01 NOTE — Telephone Encounter (Signed)
Let her know I sent a prescription for doxycycline to her pharmacy.

## 2015-02-02 ENCOUNTER — Telehealth (HOSPITAL_COMMUNITY): Payer: Self-pay | Admitting: Internal Medicine

## 2015-02-02 ENCOUNTER — Encounter (HOSPITAL_COMMUNITY): Payer: Medicare Other

## 2015-02-05 ENCOUNTER — Encounter (HOSPITAL_COMMUNITY): Payer: Medicare Other

## 2015-02-07 ENCOUNTER — Encounter (HOSPITAL_COMMUNITY): Payer: Medicare Other

## 2015-02-09 ENCOUNTER — Encounter (HOSPITAL_COMMUNITY)
Admission: RE | Admit: 2015-02-09 | Discharge: 2015-02-09 | Disposition: A | Discharge status: Home / Self Care | Payer: Medicare Other | Source: Ambulatory Visit | Attending: Cardiovascular Disease | Admitting: Cardiovascular Disease

## 2015-02-09 DIAGNOSIS — Z48812 Encounter for surgical aftercare following surgery on the circulatory system: Secondary | ICD-10-CM | POA: Diagnosis not present

## 2015-02-09 DIAGNOSIS — Z952 Presence of prosthetic heart valve: Secondary | ICD-10-CM | POA: Diagnosis not present

## 2015-02-12 ENCOUNTER — Other Ambulatory Visit: Payer: Self-pay | Admitting: Surgery

## 2015-02-12 ENCOUNTER — Encounter (HOSPITAL_COMMUNITY)
Admission: RE | Admit: 2015-02-12 | Discharge: 2015-02-12 | Disposition: A | Discharge status: Home / Self Care | Payer: Medicare Other | Source: Ambulatory Visit | Attending: Cardiovascular Disease | Admitting: Cardiovascular Disease

## 2015-02-12 DIAGNOSIS — Z952 Presence of prosthetic heart valve: Secondary | ICD-10-CM | POA: Diagnosis not present

## 2015-02-12 DIAGNOSIS — Z48812 Encounter for surgical aftercare following surgery on the circulatory system: Secondary | ICD-10-CM | POA: Diagnosis not present

## 2015-02-13 ENCOUNTER — Telehealth: Payer: Self-pay

## 2015-02-13 NOTE — Telephone Encounter (Deleted)
Patient called asking if she should continue taking her Metoprolol 25 mg and Co Q 10 along with her other meds.  She also needs them refilled.  Please advise.

## 2015-02-13 NOTE — Telephone Encounter (Signed)
Yes the pt can take both Metoprolol and CoEnzyme Q10.  Okay to refill.  Please notify pt.

## 2015-02-13 NOTE — Telephone Encounter (Signed)
Patient called to confirm that she should take the metoprolol and Co Q 10 with her other medications.  Please advise me or call her at [phone]  [phone] h   She needs them refilled as well.  Thank you

## 2015-02-14 ENCOUNTER — Encounter (HOSPITAL_COMMUNITY): Payer: Medicare Other

## 2015-02-14 ENCOUNTER — Ambulatory Visit
Admission: RE | Admit: 2015-02-14 | Discharge: 2015-02-14 | Disposition: A | Discharge status: Home / Self Care | Payer: Medicare Other | Source: Ambulatory Visit | Attending: Hematology and Oncology | Admitting: Hematology and Oncology

## 2015-02-14 ENCOUNTER — Other Ambulatory Visit: Payer: Self-pay

## 2015-02-14 ENCOUNTER — Other Ambulatory Visit: Payer: Self-pay | Admitting: Hematology and Oncology

## 2015-02-14 DIAGNOSIS — Z1231 Encounter for screening mammogram for malignant neoplasm of breast: Secondary | ICD-10-CM

## 2015-02-14 DIAGNOSIS — C50911 Malignant neoplasm of unspecified site of right female breast: Secondary | ICD-10-CM

## 2015-02-14 MED ORDER — METOPROLOL TARTRATE 25 MG PO TABS: 12.5000 mg | ORAL_TABLET | Freq: Two times a day (BID) | ORAL | Status: DC

## 2015-02-14 MED ORDER — CO Q-10 100 MG PO CAPS: 1.0000 | ORAL_CAPSULE | ORAL | Status: DC

## 2015-02-14 NOTE — Telephone Encounter (Signed)
Called patient,left detailed message stating its okay to take medications in question.  I also refilled these medications.

## 2015-02-15 DIAGNOSIS — R8271 Bacteriuria: Secondary | ICD-10-CM | POA: Diagnosis not present

## 2015-02-16 ENCOUNTER — Encounter (HOSPITAL_COMMUNITY): Payer: Medicare Other

## 2015-02-16 ENCOUNTER — Telehealth: Payer: Self-pay | Admitting: Internal Medicine

## 2015-02-19 ENCOUNTER — Encounter (HOSPITAL_COMMUNITY)
Admission: RE | Admit: 2015-02-19 | Discharge: 2015-02-19 | Disposition: A | Discharge status: Home / Self Care | Payer: Medicare Other | Source: Ambulatory Visit | Attending: Cardiovascular Disease | Admitting: Cardiovascular Disease

## 2015-02-19 DIAGNOSIS — Z48812 Encounter for surgical aftercare following surgery on the circulatory system: Secondary | ICD-10-CM | POA: Diagnosis not present

## 2015-02-19 DIAGNOSIS — Z952 Presence of prosthetic heart valve: Secondary | ICD-10-CM | POA: Diagnosis not present

## 2015-02-20 ENCOUNTER — Telehealth: Payer: Self-pay

## 2015-02-20 DIAGNOSIS — L853 Xerosis cutis: Secondary | ICD-10-CM | POA: Diagnosis not present

## 2015-02-20 DIAGNOSIS — D2261 Melanocytic nevi of right upper limb, including shoulder: Secondary | ICD-10-CM | POA: Diagnosis not present

## 2015-02-20 DIAGNOSIS — D225 Melanocytic nevi of trunk: Secondary | ICD-10-CM | POA: Diagnosis not present

## 2015-02-20 DIAGNOSIS — L814 Other melanin hyperpigmentation: Secondary | ICD-10-CM | POA: Diagnosis not present

## 2015-02-20 DIAGNOSIS — D1801 Hemangioma of skin and subcutaneous tissue: Secondary | ICD-10-CM | POA: Diagnosis not present

## 2015-02-20 DIAGNOSIS — L821 Other seborrheic keratosis: Secondary | ICD-10-CM | POA: Diagnosis not present

## 2015-02-20 NOTE — Telephone Encounter (Signed)
Call back to fup on AWV; LVM for call back regarding AWV prior to Seeing Dr. Asa Lente; No phone note so will inform and discuss;  AM apt conflicts with 8 to 10 am meeting  and it is not clear if she was asked to come in or not; to confirm

## 2015-02-21 ENCOUNTER — Encounter (HOSPITAL_COMMUNITY): Payer: Medicare Other

## 2015-02-21 ENCOUNTER — Ambulatory Visit (INDEPENDENT_AMBULATORY_CARE_PROVIDER_SITE_OTHER): Payer: Medicare Other | Admitting: Internal Medicine

## 2015-02-21 ENCOUNTER — Encounter: Payer: Self-pay | Admitting: Internal Medicine

## 2015-02-21 ENCOUNTER — Other Ambulatory Visit (INDEPENDENT_AMBULATORY_CARE_PROVIDER_SITE_OTHER): Payer: Medicare Other

## 2015-02-21 VITALS — BP 136/76 | HR 73 | Temp 98.0°F | Ht 65.0 in | Wt 152.5 lb

## 2015-02-21 DIAGNOSIS — M8580 Other specified disorders of bone density and structure, unspecified site: Secondary | ICD-10-CM | POA: Diagnosis not present

## 2015-02-21 DIAGNOSIS — Z0000 Encounter for general adult medical examination without abnormal findings: Secondary | ICD-10-CM

## 2015-02-21 DIAGNOSIS — E785 Hyperlipidemia, unspecified: Secondary | ICD-10-CM | POA: Diagnosis not present

## 2015-02-21 DIAGNOSIS — E7800 Pure hypercholesterolemia, unspecified: Secondary | ICD-10-CM

## 2015-02-21 DIAGNOSIS — Z23 Encounter for immunization: Secondary | ICD-10-CM | POA: Diagnosis not present

## 2015-02-21 LAB — LIPID PANEL
CHOL/HDL RATIO: 3
Cholesterol: 262 mg/dL — ABNORMAL HIGH (ref 0–200)
HDL: 79.4 mg/dL (ref >39.00)
LDL Cholesterol: 166 mg/dL — ABNORMAL HIGH (ref 0–99)
NONHDL: 182.43
Triglycerides: 80 mg/dL (ref 0.0–149.0)
VLDL: 16 mg/dL (ref 0.0–40.0)

## 2015-02-21 LAB — HEPATIC FUNCTION PANEL
ALK PHOS: 94 U/L (ref 39–117)
ALT: 18 U/L (ref 0–35)
AST: 23 U/L (ref 0–37)
Albumin: 4 g/dL (ref 3.5–5.2)
BILIRUBIN DIRECT: 0.1 mg/dL (ref 0.0–0.3)
BILIRUBIN TOTAL: 0.5 mg/dL (ref 0.2–1.2)
Total Protein: 6.5 g/dL (ref 6.0–8.3)

## 2015-02-21 LAB — URINALYSIS, ROUTINE W REFLEX MICROSCOPIC
BILIRUBIN URINE: NEGATIVE
Ketones, ur: NEGATIVE
LEUKOCYTES UA: NEGATIVE
NITRITE: NEGATIVE
PH: 7 (ref 5.0–8.0)
Specific Gravity, Urine: 1.01 (ref 1.000–1.030)
TOTAL PROTEIN, URINE-UPE24: NEGATIVE
URINE GLUCOSE: NEGATIVE
UROBILINOGEN UA: 0.2 (ref 0.0–1.0)

## 2015-02-21 LAB — BASIC METABOLIC PANEL
BUN: 15 mg/dL (ref 6–23)
CALCIUM: 9.4 mg/dL (ref 8.4–10.5)
CO2: 28 mEq/L (ref 19–32)
Chloride: 104 mEq/L (ref 96–112)
Creatinine, Ser: 0.82 mg/dL (ref 0.40–1.20)
GFR: 71.29 mL/min (ref >60.00)
GLUCOSE: 83 mg/dL (ref 70–99)
POTASSIUM: 4.5 meq/L (ref 3.5–5.1)
Sodium: 140 mEq/L (ref 135–145)

## 2015-02-21 MED ORDER — MONTELUKAST SODIUM 10 MG PO TABS: 10.0000 mg | ORAL_TABLET | Freq: Every day | ORAL | Status: DC

## 2015-02-21 NOTE — Progress Notes (Signed)
Subjective:   [name] is a 81 y.o. female who presents for Medicare Annual (Subsequent) preventive examination.  Review of Systems:     HRA assessment completed during visit;  The Patient was informed that this wellness visit is to identify risk and educate on how to reduce risk for increase disease through lifestyle changes.  Open heart surgery;  July 21; Bicuspid valve removed;   Cardiac rehab;   ROS deferred to CPE exam with physician  Medical issues   BMI: 25.3  Diet; Seen dietician;  More aware of sodium; sugar she never ate much; always have a protein 82th birthday this weekend; ate cake that was all sugar; but generally does not eat sweets; Does not eat a lot of cheese or carbs; but was given a list of "what not to eat" for cholesterol.  Exercise; Discussed weight bearing exercise; declines this but Plays tennis 2 to 3 days a week and walks 30 minutes every day    SAFETY/ one level; Has spouse; spouse is not as active  Safety reviewed for the home; including removal of clutter; clear paths through the home, eliminating clutter, railing as needed; bathroom safety; community safety; smoke detectors and firearms safety as well as sun protection;  Driving accidents and seatbelt Sun protection Stressors;   Medication review/ New meds  Fall assessment no Gait assessment no  Mobilization and Functional losses in the last year/ no Sleep patterns ; well with medication  /Urinary or fecal incontinence reviewed/ bowel uses benefiber   Counseling:  Colonoscopy; July 2011; and she has aged out  EKG 11/2014 Dexa scan; 01/2013 / will repeat in [place] Osteopenia  Discussed weight bearing exercise; declines this but Plays tennis 2 to 3 days a week and walks 30 minutes every day Mammogram 01/2015 no maglignacy Hearing: no issues to date Ophthalmology exam; one month ago Immunizations Due  Prevnar; will take today    Current Care Team reviewed and  updated       Objective:     Vitals: BP 136/76 mmHg  Pulse 73  Temp(Src) 98 F (36.7 C) (Oral)  Ht '5\' 5"'$  (1.651 m)  Wt 152 lb 8 oz (69.174 kg)  BMI 25.38 kg/m2  SpO2 96%  Tobacco History  Smoking status  . Former Smoker -- 0.50 packs/day for 30 years  . Types: Cigarettes  Smokeless tobacco  . Never Used    Comment: quit smoking 1980     Counseling given: Not Answered   Past Medical History  Diagnosis Date  . Hypercholesteremia   . Osteopenia   . Ulcer 2012    bleeding gastric Ulcer  . Breast cancer ([place])     right  . Aortic valve stenosis, severe   . Hiatal hernia   . Hypoglycemia    Past Surgical History  Procedure Laterality Date  . Breast surgery    . Tonsillectomy    . Parathyroidectomy    . Cosmetic surgery      face  . Cardiac catheterization N/A 10/18/2014    Procedure: Right/Left Heart Cath and Coronary Angiography;  Surgeon: [name], MD;  Location: [facility];  Service: Cardiovascular;  Laterality: N/A;  . Aortic valve replacement N/A 11/16/2014    Procedure: AORTIC VALVE REPLACEMENT (AVR);  Surgeon: Gaye Pollack, MD;  Location: [facility];  Service: Open Heart Surgery;  Laterality: N/A;  . Tee without cardioversion N/A 11/16/2014    Procedure: TRANSESOPHAGEAL ECHOCARDIOGRAM (TEE);  Surgeon: Gaye Pollack, MD;  Location:  [facility];  Service: Open Heart Surgery;  Laterality: N/A;   Family History  Problem Relation Age of Onset  . Hypertension Mother   . Rectal cancer Mother   . Kidney disease Father    History  Sexual Activity  . Sexual Activity: Not on file    Outpatient Encounter Prescriptions as of 02/21/2015  Medication Sig  . acetaminophen (TYLENOL) 500 MG tablet Take 500-1,000 mg by mouth at bedtime as needed for mild pain or moderate pain.  Marland Kitchen ALPRAZolam (XANAX) 0.5 MG tablet Take 1 tablet (0.5 mg total) by mouth at bedtime. May take additional dose as needed for stress or abdominal pain related to IBS.  Marland Kitchen aspirin EC 81 MG  tablet Take 1 tablet (81 mg total) by mouth daily.  . Calcium Carb-Cholecalciferol (CALCIUM 600+D3) 600-400 MG-UNIT TABS Take 1 tablet by mouth daily.  . chloral hydrate (SOMNOTE) 500 MG capsule Take 1 capsule (500 mg total) by mouth at bedtime. Liquid formula: take 10 mL by mouth at bedtime as needed for sleep.  . Cholecalciferol (VITAMIN D-3) 1000 UNITS CAPS Take 1 capsule by mouth daily.   . clobetasol ointment (TEMOVATE) 0.05 %   . Coenzyme Q10 (CO Q-10) 100 MG CAPS Take 1 capsule by mouth every morning.  . Magnesium 400 MG CAPS Take 400 mg by mouth every morning.   . metoprolol tartrate (LOPRESSOR) 25 MG tablet Take 0.5 tablets (12.5 mg total) by mouth 2 (two) times daily.  . montelukast (SINGULAIR) 10 MG tablet Take 10 mg by mouth at bedtime.  Marland Kitchen Ophthalmic Irrigation Solution (OCUSOFT EYE [place] OP) Apply 1 application to eye 2 (two) times daily. Morning and before bed  . Polyethyl Glycol-Propyl Glycol (SYSTANE ULTRA PF) 0.4-0.3 % SOLN Apply 1 drop to eye 2 (two) times daily.  . Red Yeast Rice Extract ([employer] RED YEAST RICE PO) Take 600 mg by mouth daily.   . Wheat Dextrin (BENEFIBER DRINK MIX PO) Take 5 mLs by mouth every morning. Mix with 1/2 a glass water, and takes with vitamins  . senna-docusate (SENOKOT-S) 8.6-50 MG per tablet Take 1 tablet by mouth daily. With oral iron. (Patient not taking: Reported on 02/21/2015)  . [DISCONTINUED] CALCIUM CITRATE PO Take 500 mg by mouth daily.  . [DISCONTINUED] chloral hydrate (SOMNOTE) 500 MG capsule Take 500 mg by mouth at bedtime.  . [DISCONTINUED] doxycycline (VIBRA-TABS) 100 MG tablet Take 1 tablet (100 mg total) by mouth 2 (two) times daily. (Patient not taking: Reported on 02/21/2015)  . [DISCONTINUED] fluticasone (FLONASE) 50 MCG/ACT nasal spray Place 1 spray into both nostrils as needed for allergies.   . [DISCONTINUED] HYDROcodone-homatropine (HYCODAN) 5-1.5 MG/5ML syrup Take 5 mLs by mouth every 8 (eight) hours as needed for cough.  .  [DISCONTINUED] HYDROcodone-homatropine (HYCODAN) 5-1.5 MG/5ML syrup Take 5 mLs by mouth every 8 (eight) hours as needed for cough. (Patient not taking: Reported on 02/21/2015)  . [DISCONTINUED] LORazepam (ATIVAN) 0.5 MG tablet Take 0.5 mg by mouth as needed for anxiety.   No facility-administered encounter medications on file as of 02/21/2015.    Activities of Daily Living In your present state of health, do you have any difficulty performing the following activities: 02/21/2015 11/16/2014  Hearing? N N  Vision? N N  Difficulty concentrating or making decisions? N N  Walking or climbing stairs? N N  Dressing or bathing? N N  Doing errands, shopping? N N  Preparing Food and eating ? N -  Using the Toilet? N -  In the  past six months, have you accidently leaked urine? N -  Do you have problems with loss of bowel control? N -  Managing your Medications? N -  Managing your Finances? N -  Housekeeping or managing your Housekeeping? N -    Patient Care Team: Rowe Clack, MD as PCP - General (Internal Medicine)    Assessment:    Assessment   Patient presents for yearly preventative medicine examination. Medicare questionnaire screening were completed, i.e. Functional; fall risk; depression, memory loss and hearing. No issues  All immunizations and health maintenance protocols were reviewed with the patient and needed orders were placed.(will take prevnar today)   Education provided for laboratory screens;    Medication reconciliation, past medical history, social history, problem list and allergies were reviewed in detail with the patient/   Goals were established with regard to weight loss, exercise, and diet in compliance with medications based on the patient individualized risk;   End of life planning was discussed. completed   Exercise Activities and Dietary recommendations    Goals    . patient     Will stay active      Fall Risk Fall Risk  02/21/2015  12/01/2013 11/29/2012  Falls in the past year? No No No   Depression Screen PHQ 2/9 Scores 02/21/2015 12/25/2014 12/01/2013 11/29/2012  PHQ - 2 Score 0 0 0 1     Cognitive Testing MMSE - Mini Mental State Exam 02/21/2015  Not completed: (No Data)    Immunization History  Administered Date(s) Administered  . Influenza Split 01/29/2011, 02/10/2012  . Influenza Whole 01/27/2004, 02/02/2007, 02/08/2008, 02/09/2009, 01/16/2010  . Influenza,inj,Quad PF,36+ Mos 01/18/2013, 01/26/2014, 01/23/2015  . Pneumococcal Polysaccharide-23 04/28/2004  . Td 04/28/2005  . Zoster 04/28/2009   Screening Tests Health Maintenance  Topic Date Due  . PNA vac Low Risk Adult (2 of 2 - PCV13) 04/28/2005  . TETANUS/TDAP  04/29/2015  . INFLUENZA VACCINE  11/27/2015  . DEXA SCAN  Completed  . ZOSTAVAX  Completed      Plan:   Will take prevnar today During the course of the visit the patient was educated and counseled about the following appropriate screening and preventive services:   Vaccines to include Pneumoccal, Influenza, Hepatitis B, Td, Zostavax, HCV  Electrocardiogram completed by cardio  Cardiovascular Disease/ chol free diet;   Colorectal cancer screening/ aged out  Bone density screening; to be repeated in [place] by GYN  Diabetes screening/ 5.8 A1c last year; no family hx  Glaucoma screening/ neg  Mammography/PAP/ neg 01/2015  Nutrition counseling completed by dietician  Patient Instructions (the written plan) was given to the patient.   Wynetta Fines, RN  02/21/2015

## 2015-02-21 NOTE — Progress Notes (Signed)
Subjective:    Patient ID: [name], female    DOB: May 11, 1932, 81 y.o.   MRN: [id number]  HPI   Here for medicare wellness  Diet: heart healthy  Physical activity: plays tennis Depression/mood screen: negative Hearing: intact to whispered voice Visual acuity: grossly normal, performs annual eye exam  ADLs: capable Fall risk: none Home safety: good Cognitive evaluation: intact to orientation, naming, recall and repetition EOL planning: adv directives, full code/ I agree  I have personally reviewed and have noted 1. The patient's medical and social history 2. Their use of alcohol, tobacco or illicit drugs 3. Their current medications and supplements 4. The patient's functional ability including ADL's, fall risks, home safety risks and hearing or visual impairment. 5. Diet and physical activities 6. Evidence for depression or mood disorders  Also reviewed chronic medical conditions, interval events and current concerns   Past Medical History  Diagnosis Date  . Hypercholesteremia   . Osteopenia   . Ulcer 2012    bleeding gastric Ulcer  . Breast cancer ([address] [place])     right  . Aortic valve stenosis, severe   . Hiatal hernia   . Hypoglycemia    Family History  Problem Relation Age of Onset  . Hypertension Mother   . Rectal cancer Mother   . Kidney disease Father    Social History  Substance Use Topics  . Smoking status: Former Smoker -- 0.50 packs/day for 30 years    Types: Cigarettes  . Smokeless tobacco: Never Used     Comment: quit smoking 1980  . Alcohol Use: None    Review of Systems  Constitutional: Negative for fatigue and unexpected weight change.  Respiratory: Negative for cough, shortness of breath and wheezing.   Cardiovascular: Negative for chest pain, palpitations and leg swelling.  Gastrointestinal: Negative for nausea, abdominal pain and diarrhea.  Neurological: Negative for dizziness, weakness, light-headedness and headaches.    Psychiatric/Behavioral: Negative for dysphoric mood. The patient is not nervous/anxious.   All other systems reviewed and are negative.      Objective:    Physical Exam  Constitutional: She appears well-developed and well-nourished. No distress.  Cardiovascular: Normal rate, regular rhythm and normal heart sounds.   No murmur heard. Pulmonary/Chest: Effort normal and breath sounds normal. No respiratory distress.  Musculoskeletal: She exhibits no edema.    BP 136/76 mmHg  Pulse 73  Temp(Src) 98 F (36.7 C) (Oral)  Ht '5\' 5"'$  (1.651 m)  Wt 152 lb 8 oz (69.174 kg)  BMI 25.38 kg/m2  SpO2 96% Wt Readings from Last 3 Encounters:  02/21/15 152 lb 8 oz (69.174 kg)  01/30/15 153 lb 6 oz (69.57 kg)  12/20/14 150 lb (68.04 kg)     Lab Results  Component Value Date   WBC 5.3 12/15/2014   HGB 11.9* 12/15/2014   HCT 36.4 12/15/2014   PLT 246 12/15/2014   GLUCOSE 83 02/21/2015   CHOL 262* 02/21/2015   TRIG 80.0 02/21/2015   HDL 79.40 02/21/2015   LDLDIRECT 144.0 11/29/2012   LDLCALC 166* 02/21/2015   ALT 18 02/21/2015   AST 23 02/21/2015   NA 140 02/21/2015   K 4.5 02/21/2015   CL 104 02/21/2015   CREATININE 0.82 02/21/2015   BUN 15 02/21/2015   CO2 28 02/21/2015   TSH 0.96 12/01/2013   INR 1.73* 11/16/2014   HGBA1C 5.8* 11/14/2014    No results found.     Assessment & Plan:   AWV/z00.00 - please see  accompanying note by clinic RN health coach for this documentation where today patient counseled on age appropriate routine health concerns for screening and prevention, each reviewed and up to date or declined. Immunizations reviewed and up to date or declined. Labs ordered and reviewed. Risk factors for depression reviewed and negative. Hearing function and visual acuity are intact. ADLs screened and addressed as needed. Functional ability and level of safety reviewed and appropriate. Education, counseling and referrals performed based on assessed risks today. Patient  provided with a copy of personalized plan for preventive services.  Problem List Items Addressed This Visit    Borderline hyperlipidemia    Never on rx medication Excellent HDL ratio On OTC RYR for same Check annually and tx as needed      Osteopenia    Remote fosamax therapy - but poor absorbton so stopped same On Ca+D No fracture hx WB exercises with tennis       Other Visit Diagnoses    Annual physical exam    -  Primary    Relevant Orders    Hepatic function panel (Completed)    Lipid Profile (Completed)    Basic Metabolic Panel (BMET) (Completed)    Urinalysis, Routine w reflex microscopic (Completed)    Borderline hypercholesterolemia        Relevant Orders    Lipid Profile (Completed)    Vit D  25 hydroxy (rtn osteoporosis monitoring)    Hypocalcemia        Relevant Orders    Basic Metabolic Panel (BMET) (Completed)        Gwendolyn Grant, MD

## 2015-02-21 NOTE — Assessment & Plan Note (Signed)
Remote fosamax therapy - but poor absorbton so stopped same On Ca+D No fracture hx WB exercises with tennis

## 2015-02-21 NOTE — Patient Instructions (Signed)
Health Maintenance, Female Adopting a healthy lifestyle and getting preventive care can go a long way to promote health and wellness. Talk with your health care provider about what schedule of regular examinations is right for you. This is a good chance for you to check in with your provider about disease prevention and staying healthy. In between checkups, there are plenty of things you can do on your own. Experts have done a lot of research about which lifestyle changes and preventive measures are most likely to keep you healthy. Ask your health care provider for more information. WEIGHT AND DIET  Eat a healthy diet  Be sure to include plenty of vegetables, fruits, low-fat dairy products, and lean protein.  Do not eat a lot of foods high in solid fats, added sugars, or salt.  Get regular exercise. This is one of the most important things you can do for your health.  Most adults should exercise for at least 150 minutes each week. The exercise should increase your heart rate and make you sweat (moderate-intensity exercise).  Most adults should also do strengthening exercises at least twice a week. This is in addition to the moderate-intensity exercise.  Maintain a healthy weight  Body mass index (BMI) is a measurement that can be used to identify possible weight problems. It estimates body fat based on height and weight. Your health care provider can help determine your BMI and help you achieve or maintain a healthy weight.  For females 20 years of age and older:   A BMI below 18.5 is considered underweight.  A BMI of 18.5 to 24.9 is normal.  A BMI of 25 to 29.9 is considered overweight.  A BMI of 30 and above is considered obese.  Watch levels of cholesterol and blood lipids  You should start having your blood tested for lipids and cholesterol at 79 years of age, then have this test every 5 years.  You may need to have your cholesterol levels checked more often if:  Your lipid  or cholesterol levels are high.  You are older than 79 years of age.  You are at high risk for heart disease.  CANCER SCREENING   Lung Cancer  Lung cancer screening is recommended for adults 55-80 years old who are at high risk for lung cancer because of a history of smoking.  A yearly low-dose CT scan of the lungs is recommended for people who:  Currently smoke.  Have quit within the past 15 years.  Have at least a 30-pack-year history of smoking. A pack year is smoking an average of one pack of cigarettes a day for 1 year.  Yearly screening should continue until it has been 15 years since you quit.  Yearly screening should stop if you develop a health problem that would prevent you from having lung cancer treatment.  Breast Cancer  Practice breast self-awareness. This means understanding how your breasts normally appear and feel.  It also means doing regular breast self-exams. Let your health care provider know about any changes, no matter how small.  If you are in your 20s or 30s, you should have a clinical breast exam (CBE) by a health care provider every 1-3 years as part of a regular health exam.  If you are 40 or older, have a CBE every year. Also consider having a breast X-ray (mammogram) every year.  If you have a family history of breast cancer, talk to your health care provider about genetic screening.  If you   are at high risk for breast cancer, talk to your health care provider about having an MRI and a mammogram every year.  Breast cancer gene (BRCA) assessment is recommended for women who have family members with BRCA-related cancers. BRCA-related cancers include:  Breast.  Ovarian.  Tubal.  Peritoneal cancers.  Results of the assessment will determine the need for genetic counseling and BRCA1 and BRCA2 testing. Cervical Cancer Your health care provider may recommend that you be screened regularly for cancer of the pelvic organs (ovaries, uterus, and  vagina). This screening involves a pelvic examination, including checking for microscopic changes to the surface of your cervix (Pap test). You may be encouraged to have this screening done every 3 years, beginning at age 21.  For women ages 30-65, health care providers may recommend pelvic exams and Pap testing every 3 years, or they may recommend the Pap and pelvic exam, combined with testing for human papilloma virus (HPV), every 5 years. Some types of HPV increase your risk of cervical cancer. Testing for HPV may also be done on women of any age with unclear Pap test results.  Other health care providers may not recommend any screening for nonpregnant women who are considered low risk for pelvic cancer and who do not have symptoms. Ask your health care provider if a screening pelvic exam is right for you.  If you have had past treatment for cervical cancer or a condition that could lead to cancer, you need Pap tests and screening for cancer for at least 20 years after your treatment. If Pap tests have been discontinued, your risk factors (such as having a new sexual partner) need to be reassessed to determine if screening should resume. Some women have medical problems that increase the chance of getting cervical cancer. In these cases, your health care provider may recommend more frequent screening and Pap tests. Colorectal Cancer  This type of cancer can be detected and often prevented.  Routine colorectal cancer screening usually begins at 79 years of age and continues through 79 years of age.  Your health care provider may recommend screening at an earlier age if you have risk factors for colon cancer.  Your health care provider may also recommend using home test kits to check for hidden blood in the stool.  A small camera at the end of a tube can be used to examine your colon directly (sigmoidoscopy or colonoscopy). This is done to check for the earliest forms of colorectal  cancer.  Routine screening usually begins at age 50.  Direct examination of the colon should be repeated every 5-10 years through 79 years of age. However, you may need to be screened more often if early forms of precancerous polyps or small growths are found. Skin Cancer  Check your skin from head to toe regularly.  Tell your health care provider about any new moles or changes in moles, especially if there is a change in a mole's shape or color.  Also tell your health care provider if you have a mole that is larger than the size of a pencil eraser.  Always use sunscreen. Apply sunscreen liberally and repeatedly throughout the day.  Protect yourself by wearing long sleeves, pants, a wide-brimmed hat, and sunglasses whenever you are outside. HEART DISEASE, DIABETES, AND HIGH BLOOD PRESSURE   High blood pressure causes heart disease and increases the risk of stroke. High blood pressure is more likely to develop in:  People who have blood pressure in the high end   of the normal range (130-139/85-89 mm Hg).  People who are overweight or obese.  People who are African American.  If you are 38-23 years of age, have your blood pressure checked every 3-5 years. If you are 61 years of age or older, have your blood pressure checked every year. You should have your blood pressure measured twice--once when you are at a hospital or clinic, and once when you are not at a hospital or clinic. Record the average of the two measurements. To check your blood pressure when you are not at a hospital or clinic, you can use:  An automated blood pressure machine at a pharmacy.  A home blood pressure monitor.  If you are between 45 years and 39 years old, ask your health care provider if you should take aspirin to prevent strokes.  Have regular diabetes screenings. This involves taking a blood sample to check your fasting blood sugar level.  If you are at a normal weight and have a low risk for diabetes,  have this test once every three years after 79 years of age.  If you are overweight and have a high risk for diabetes, consider being tested at a younger age or more often. PREVENTING INFECTION  Hepatitis B  If you have a higher risk for hepatitis B, you should be screened for this virus. You are considered at high risk for hepatitis B if:  You were born in a country where hepatitis B is common. Ask your health care provider which countries are considered high risk.  Your parents were born in a high-risk country, and you have not been immunized against hepatitis B (hepatitis B vaccine).  You have HIV or AIDS.  You use needles to inject street drugs.  You live with someone who has hepatitis B.  You have had sex with someone who has hepatitis B.  You get hemodialysis treatment.  You take certain medicines for conditions, including cancer, organ transplantation, and autoimmune conditions. Hepatitis C  Blood testing is recommended for:  Everyone born from [birth year] through 1963.  Anyone with known risk factors for hepatitis C. Sexually transmitted infections (STIs)  You should be screened for sexually transmitted infections (STIs) including gonorrhea and chlamydia if:  You are sexually active and are younger than 79 years of age.  You are older than 79 years of age and your health care provider tells you that you are at risk for this type of infection.  Your sexual activity has changed since you were last screened and you are at an increased risk for chlamydia or gonorrhea. Ask your health care provider if you are at risk.  If you do not have HIV, but are at risk, it may be recommended that you take a prescription medicine daily to prevent HIV infection. This is called pre-exposure prophylaxis (PrEP). You are considered at risk if:  You are sexually active and do not regularly use condoms or know the HIV status of your partner(s).  You take drugs by injection.  You are sexually  active with a partner who has HIV. Talk with your health care provider about whether you are at high risk of being infected with HIV. If you choose to begin PrEP, you should first be tested for HIV. You should then be tested every 3 months for as long as you are taking PrEP.  PREGNANCY   If you are premenopausal and you may become pregnant, ask your health care provider about preconception counseling.  If you may  become pregnant, take 400 to 800 micrograms (mcg) of folic acid every day.  If you want to prevent pregnancy, talk to your health care provider about birth control (contraception). OSTEOPOROSIS AND MENOPAUSE   Osteoporosis is a disease in which the bones lose minerals and strength with aging. This can result in serious bone fractures. Your risk for osteoporosis can be identified using a bone density scan.  If you are 61 years of age or older, or if you are at risk for osteoporosis and fractures, ask your health care provider if you should be screened.  Ask your health care provider whether you should take a calcium or vitamin D supplement to lower your risk for osteoporosis.  Menopause may have certain physical symptoms and risks.  Hormone replacement therapy may reduce some of these symptoms and risks. Talk to your health care provider about whether hormone replacement therapy is right for you.  HOME CARE INSTRUCTIONS   Schedule regular health, dental, and eye exams.  Stay current with your immunizations.   Do not use any tobacco products including cigarettes, chewing tobacco, or electronic cigarettes.  If you are pregnant, do not drink alcohol.  If you are breastfeeding, limit how much and how often you drink alcohol.  Limit alcohol intake to no more than 1 drink per day for nonpregnant women. One drink equals 12 ounces of beer, 5 ounces of wine, or 1 ounces of hard liquor.  Do not use street drugs.  Do not share needles.  Ask your health care provider for help if  you need support or information about quitting drugs.  Tell your health care provider if you often feel depressed.  Tell your health care provider if you have ever been abused or do not feel safe at home.   This information is not intended to replace advice given to you by your health care provider. Make sure you discuss any questions you have with your health care provider.   Document Released: 10/28/2010 Document Revised: 05/05/2014 Document Reviewed: 03/16/2013 Elsevier Interactive Patient Education [employer].

## 2015-02-21 NOTE — Assessment & Plan Note (Signed)
Never on rx medication Excellent HDL ratio On OTC RYR for same Check annually and tx as needed 

## 2015-02-23 ENCOUNTER — Other Ambulatory Visit: Payer: Self-pay | Admitting: *Deleted

## 2015-02-23 ENCOUNTER — Encounter (HOSPITAL_COMMUNITY)
Admission: RE | Admit: 2015-02-23 | Discharge: 2015-02-23 | Disposition: A | Discharge status: Home / Self Care | Payer: Medicare Other | Source: Ambulatory Visit | Attending: Cardiovascular Disease | Admitting: Cardiovascular Disease

## 2015-02-23 DIAGNOSIS — Z48812 Encounter for surgical aftercare following surgery on the circulatory system: Secondary | ICD-10-CM | POA: Diagnosis not present

## 2015-02-23 DIAGNOSIS — Z952 Presence of prosthetic heart valve: Secondary | ICD-10-CM | POA: Diagnosis not present

## 2015-02-23 NOTE — Telephone Encounter (Signed)
Pharmacy comfirmed that they have the refills for the chloral hydrate syrup.

## 2015-02-23 NOTE — Telephone Encounter (Signed)
Left message for [name] to verify refill of the chloral hydrate syrup.  Email sent to pt stating same.

## 2015-02-26 ENCOUNTER — Other Ambulatory Visit (INDEPENDENT_AMBULATORY_CARE_PROVIDER_SITE_OTHER): Payer: Medicare Other

## 2015-02-26 ENCOUNTER — Encounter (HOSPITAL_COMMUNITY)
Admission: RE | Admit: 2015-02-26 | Discharge: 2015-02-26 | Disposition: A | Discharge status: Home / Self Care | Payer: Medicare Other | Source: Ambulatory Visit | Attending: Cardiovascular Disease | Admitting: Cardiovascular Disease

## 2015-02-26 DIAGNOSIS — Z952 Presence of prosthetic heart valve: Secondary | ICD-10-CM | POA: Diagnosis not present

## 2015-02-26 DIAGNOSIS — E7800 Pure hypercholesterolemia, unspecified: Secondary | ICD-10-CM

## 2015-02-26 DIAGNOSIS — Z48812 Encounter for surgical aftercare following surgery on the circulatory system: Secondary | ICD-10-CM | POA: Diagnosis not present

## 2015-02-26 LAB — VITAMIN D 1,25 DIHYDROXY
Vitamin D 1, 25 (OH)2 Total: 33 pg/mL (ref 18–72)
Vitamin D2 1, 25 (OH)2: <8 pg/mL
Vitamin D3 1, 25 (OH)2: 33 pg/mL

## 2015-02-26 LAB — VITAMIN D 25 HYDROXY (VIT D DEFICIENCY, FRACTURES): VITD: 49.56 ng/mL (ref 30.00–100.00)

## 2015-02-28 ENCOUNTER — Encounter (HOSPITAL_COMMUNITY)
Admission: RE | Admit: 2015-02-28 | Discharge: 2015-02-28 | Disposition: A | Discharge status: Home / Self Care | Payer: Medicare Other | Source: Ambulatory Visit | Attending: Cardiovascular Disease | Admitting: Cardiovascular Disease

## 2015-02-28 DIAGNOSIS — Z952 Presence of prosthetic heart valve: Secondary | ICD-10-CM | POA: Diagnosis not present

## 2015-02-28 DIAGNOSIS — Z48812 Encounter for surgical aftercare following surgery on the circulatory system: Secondary | ICD-10-CM | POA: Insufficient documentation

## 2015-03-02 ENCOUNTER — Encounter (HOSPITAL_COMMUNITY): Payer: Medicare Other

## 2015-03-05 ENCOUNTER — Telehealth: Payer: Self-pay | Admitting: Internal Medicine

## 2015-03-05 ENCOUNTER — Encounter (HOSPITAL_COMMUNITY)
Admission: RE | Admit: 2015-03-05 | Discharge: 2015-03-05 | Disposition: A | Discharge status: Home / Self Care | Payer: Medicare Other | Source: Ambulatory Visit | Attending: Cardiovascular Disease | Admitting: Cardiovascular Disease

## 2015-03-05 DIAGNOSIS — Z952 Presence of prosthetic heart valve: Secondary | ICD-10-CM | POA: Diagnosis not present

## 2015-03-05 DIAGNOSIS — Z48812 Encounter for surgical aftercare following surgery on the circulatory system: Secondary | ICD-10-CM | POA: Diagnosis not present

## 2015-03-05 NOTE — Telephone Encounter (Signed)
States she got Dr. Katheren Puller letter about leaving.  Since she has a strong relationship with [employer] she would like her opinion as to who she should transfer to.  States she will only be home for another 30 minutes.

## 2015-03-06 NOTE — Telephone Encounter (Signed)
Message sent to pt via mychart

## 2015-03-07 ENCOUNTER — Encounter (HOSPITAL_COMMUNITY): Payer: Medicare Other

## 2015-03-09 ENCOUNTER — Encounter (HOSPITAL_COMMUNITY): Payer: Medicare Other

## 2015-03-12 ENCOUNTER — Encounter: Payer: Self-pay | Admitting: Cardiovascular Disease

## 2015-03-12 ENCOUNTER — Ambulatory Visit (INDEPENDENT_AMBULATORY_CARE_PROVIDER_SITE_OTHER): Payer: Medicare Other | Admitting: Cardiovascular Disease

## 2015-03-12 ENCOUNTER — Ambulatory Visit (HOSPITAL_COMMUNITY): Payer: Medicare Other | Attending: Cardiovascular Disease

## 2015-03-12 ENCOUNTER — Other Ambulatory Visit: Payer: Self-pay

## 2015-03-12 ENCOUNTER — Encounter (HOSPITAL_COMMUNITY): Payer: Medicare Other

## 2015-03-12 VITALS — BP 110/64 | HR 48 | Ht 65.0 in | Wt 155.2 lb

## 2015-03-12 DIAGNOSIS — I350 Nonrheumatic aortic (valve) stenosis: Secondary | ICD-10-CM | POA: Diagnosis present

## 2015-03-12 DIAGNOSIS — Z952 Presence of prosthetic heart valve: Secondary | ICD-10-CM | POA: Insufficient documentation

## 2015-03-12 DIAGNOSIS — I341 Nonrheumatic mitral (valve) prolapse: Secondary | ICD-10-CM | POA: Diagnosis not present

## 2015-03-12 DIAGNOSIS — I340 Nonrheumatic mitral (valve) insufficiency: Secondary | ICD-10-CM | POA: Insufficient documentation

## 2015-03-12 DIAGNOSIS — I359 Nonrheumatic aortic valve disorder, unspecified: Secondary | ICD-10-CM

## 2015-03-12 DIAGNOSIS — Z9889 Other specified postprocedural states: Secondary | ICD-10-CM | POA: Diagnosis not present

## 2015-03-12 DIAGNOSIS — I351 Nonrheumatic aortic (valve) insufficiency: Secondary | ICD-10-CM | POA: Diagnosis not present

## 2015-03-12 DIAGNOSIS — E785 Hyperlipidemia, unspecified: Secondary | ICD-10-CM

## 2015-03-12 NOTE — Progress Notes (Signed)
Cardiology Office Note Date:  03/12/2015   ID:  [name], DOB 1932-10-15, MRN [id number]  PCP:  Gwendolyn Grant, MD  Cardiologist:  [name], MD    Chief Complaint  Patient presents with  . Follow-up    echocardiogram    History of Present Illness: [name] is a 81 y.o. female who presents for  follow-up evaluation.  She underwent bioprosthetic aortic valve replacement 11/16/2014 for treatment of severe bicuspid aortic stenosis. She has bileaflet mitral valve prolapse with mild mitral regurgitation. She was treated with a 21 mm Edwards magna ease pericardial valve. She had transient atrial fibrillation postoperatively.  The patient is doing well. She had a follow-up echocardiogram this morning. She plans to leave for [place] in the next few weeks and will be there for a period of about 6 months. She did have an episode of palpitations last week at night, but no recurrence. She has no other complaints at present and specifically denies chest pain, chest pressure, or shortness of breath.  Past Medical History  Diagnosis Date  . Hypercholesteremia   . Osteopenia   . Ulcer 2012    bleeding gastric Ulcer  . Breast cancer ([place])     right  . Aortic valve stenosis, severe   . Hiatal hernia   . Hypoglycemia     Past Surgical History  Procedure Laterality Date  . Breast surgery    . Tonsillectomy    . Parathyroidectomy    . Cosmetic surgery      face  . Cardiac catheterization N/A 10/18/2014    Procedure: Right/Left Heart Cath and Coronary Angiography;  Surgeon: [name], MD;  Location: [facility];  Service: Cardiovascular;  Laterality: N/A;  . Aortic valve replacement N/A 11/16/2014    Procedure: AORTIC VALVE REPLACEMENT (AVR);  Surgeon: Gaye Pollack, MD;  Location: [facility];  Service: Open Heart Surgery;  Laterality: N/A;  . Tee without cardioversion N/A 11/16/2014    Procedure: TRANSESOPHAGEAL ECHOCARDIOGRAM (TEE);  Surgeon: Gaye Pollack,  MD;  Location: [facility];  Service: Open Heart Surgery;  Laterality: N/A;    Current Outpatient Prescriptions  Medication Sig Dispense Refill  . acetaminophen (TYLENOL) 500 MG tablet Take 500-1,000 mg by mouth at bedtime as needed for mild pain or moderate pain.    Marland Kitchen ALPRAZolam (XANAX) 0.5 MG tablet Take 1 tablet (0.5 mg total) by mouth at bedtime. May take additional dose as needed for stress or abdominal pain related to IBS. 120 tablet 2  . aspirin EC 81 MG tablet Take 1 tablet (81 mg total) by mouth daily.    . Calcium Carb-Cholecalciferol (CALCIUM 600+D3) 600-400 MG-UNIT TABS Take 1 tablet by mouth daily.    . chloral hydrate (SOMNOTE) 500 MG capsule Take 1 capsule (500 mg total) by mouth at bedtime. Liquid formula: take 10 mL by mouth at bedtime as needed for sleep. 270 capsule 3  . Cholecalciferol (VITAMIN D-3) 1000 UNITS CAPS Take 1 capsule by mouth daily.     . clobetasol ointment (TEMOVATE) 2.75 % Apply 1 application topically 2 (two) times daily.     . Coenzyme Q10 (CO Q-10) 100 MG CAPS Take 1 capsule by mouth every morning. 30 each 8  . Magnesium 400 MG CAPS Take 400 mg by mouth every morning.     . metoprolol tartrate (LOPRESSOR) 25 MG tablet Take 0.5 tablets (12.5 mg total) by mouth 2 (two) times daily. 30 tablet 6  . montelukast (SINGULAIR) 10 MG  tablet Take 1 tablet (10 mg total) by mouth at bedtime. 90 tablet 1  . Ophthalmic Irrigation Solution (OCUSOFT EYE [place] OP) Apply 1 application to eye 2 (two) times daily. Morning and before bed    . Polyethyl Glycol-Propyl Glycol (SYSTANE ULTRA PF) 0.4-0.3 % SOLN Apply 1 drop to eye 2 (two) times daily.    . Red Yeast Rice Extract ([employer] RED YEAST RICE PO) Take 600 mg by mouth daily.     . Wheat Dextrin (BENEFIBER DRINK MIX PO) Take 5 mLs by mouth every morning. Mix with 1/2 a glass water, and takes with vitamins     No current facility-administered medications for this visit.    Allergies:   Amoxicillin-pot clavulanate; Penicillins; and  Sulfonamide derivatives   Social History:  The patient  reports that she has quit smoking. Her smoking use included Cigarettes. She has a 15 pack-year smoking history. She has never used smokeless tobacco. She reports that she does not use illicit drugs.   Family History:  The patient's  family history includes Hypertension in her mother; Kidney disease in her father; Rectal cancer in her mother.    ROS:  Please see the history of present illness.  All other systems are reviewed and negative.    PHYSICAL EXAM: VS:  BP 110/64 mmHg  Pulse 48  Ht '5\' 5"'$  (1.651 m)  Wt 155 lb 3.2 oz (70.398 kg)  BMI 25.83 kg/m2  SpO2 97% , BMI Body mass index is 25.83 kg/(m^2). GEN: Well nourished, well developed, in no acute distress HEENT: normal Neck: no JVD, no masses. No carotid bruits Cardiac: RRR with 2/6 SEM at the RUSB and 2/6 diastolic decrescendo murmur best heard at the RUSB             Respiratory:  clear to auscultation bilaterally, normal work of breathing GI: soft, nontender, nondistended, + BS MS: no deformity or atrophy Ext: no pretibial edema, pedal pulses 2+= bilaterally Skin: warm and dry, no rash Neuro:  Strength and sensation are intact Psych: euthymic mood, full affect  EKG:  EKG is not ordered today.  Recent Labs: 11/17/2014: Magnesium 1.5* 12/15/2014: Hemoglobin 11.9*; Platelets 246 02/21/2015: ALT 18; BUN 15; Creatinine, Ser 0.82; Potassium 4.5; Sodium 140   Lipid Panel     Component Value Date/Time   CHOL 262* 02/21/2015 0913   TRIG 80.0 02/21/2015 0913   HDL 79.40 02/21/2015 0913   CHOLHDL 3 02/21/2015 0913   VLDL 16.0 02/21/2015 0913   LDLCALC 166* 02/21/2015 0913   LDLDIRECT 144.0 11/29/2012 1601      Wt Readings from Last 3 Encounters:  03/12/15 155 lb 3.2 oz (70.398 kg)  02/21/15 152 lb 8 oz (69.174 kg)  01/30/15 153 lb 6 oz (69.57 kg)     Cardiac Studies Reviewed: 2D Echo pending  ASSESSMENT AND PLAN: Aortic valve disorder status post bioprosthetic  aortic valve replacement: the patient is cleared to participate in all of her regular activities. She plans to resume doubles tennis when she goes to [place]. She was advised about SBE prophylaxis. She would be appropriate for clindamycin in the context of a penicillin allergy. I personally reviewed her echo images and she does appear to have mild paravalvular aortic insufficiency. LV function is normal and there is bileaflet MVP with only mild MR. Will await formal interpretation. Anticipate repeating an echocardiogram in one year unless clinical symptoms arise. The patient will return in 6 months for clinical follow-up.     Current medicines are reviewed  with the patient today.  The patient does not have concerns regarding medicines.  Labs/ tests ordered today include:  No orders of the defined types were placed in this encounter.    Disposition:   FU 6 months  Signed, [name], MD  03/12/2015 10:46 AM    [facility] [place], [place], [place]  [address] Phone: [phone]; Fax: [phone]

## 2015-03-12 NOTE — Patient Instructions (Addendum)
Medication Instructions:  Your physician recommends that you continue on your current medications as directed. Please refer to the Current Medication list given to you today.  Labwork: No new orders.   Testing/Procedures: No new orders.   Follow-Up: Your physician wants you to follow-up in: June 2017 with Dr Burt Knack.  You will receive a reminder letter in the mail two months in advance. If you don't receive a letter, please call our office to schedule the follow-up appointment.   Any Other Special Instructions Will Be Listed Below (If Applicable).  Your physician discussed the importance of taking an antibiotic prior to any dental, gastrointestinal, genitourinary procedures to prevent damage to the heart valves from infection. You were given an SBE card today with current SBE prophylaxis guidelines. (Clindamycin '600mg'$  due to PCN allergy)  If you need a refill on your cardiac medications before your next appointment, please call your pharmacy.

## 2015-03-14 ENCOUNTER — Encounter (HOSPITAL_COMMUNITY)
Admission: RE | Admit: 2015-03-14 | Discharge: 2015-03-14 | Disposition: A | Discharge status: Home / Self Care | Payer: Medicare Other | Source: Ambulatory Visit | Attending: Cardiovascular Disease | Admitting: Cardiovascular Disease

## 2015-03-14 ENCOUNTER — Telehealth: Payer: Self-pay | Admitting: Cardiovascular Disease

## 2015-03-14 DIAGNOSIS — Z952 Presence of prosthetic heart valve: Secondary | ICD-10-CM | POA: Diagnosis not present

## 2015-03-14 DIAGNOSIS — Z48812 Encounter for surgical aftercare following surgery on the circulatory system: Secondary | ICD-10-CM | POA: Diagnosis not present

## 2015-03-14 MED ORDER — CLINDAMYCIN HCL 300 MG PO CAPS: ORAL_CAPSULE | ORAL | Status: DC

## 2015-03-14 NOTE — Telephone Encounter (Signed)
New message     Pt is leaving for [place] for the holiday.  She want a presc called in to [facility] to take with her incase she needs dental work while there.  Not sure what she is talking about because an appt has not been scheduled.  Please call pt

## 2015-03-14 NOTE — Telephone Encounter (Signed)
Clindamycin 600 mg PO one hour prior to dental cleaning

## 2015-03-14 NOTE — Telephone Encounter (Signed)
Notified pt that Dr. Burt Knack wants her to take Clindamycin (cleocin) 600 mg 1 hr prior to dental procedure.  Rx sent to [facility] she will be taking 2 tablets since the medication only comes in 300 mg.  She verbalizes understanding and appreciates the quick response.

## 2015-03-14 NOTE — Progress Notes (Addendum)
Pt graduated from cardiac rehab program today with completion of 21 exercise sessions in Phase II. Pt maintained good attendance and progressed nicely during his participation in rehab as evidenced by increased MET level.   Medication list reconciled. Repeat  PHQ score- 0 .  Pt has made significant lifestyle changes and should be commended for her success. Pt feels she has achieved her goals during cardiac rehab.   Pt plans to continue exercise by walking. Madonna completed the program early so that she can go to [place] for the winter.

## 2015-03-14 NOTE — Telephone Encounter (Signed)
Calling stating she saw Dr. Burt Knack on Monday and he told her she would need antibiotic prior to dental procedures.  In OV note stated Clindamycin but did not specify strength.  She would like a Rx sent to [facility] in [place]. She will be going to [place] and wants to have it on hand.  She has allergies to Amoxicillin,PCN and sulfa. Advised would send message to Dr. Burt Knack for exact dosage and Doyle Askew.

## 2015-03-16 ENCOUNTER — Encounter (HOSPITAL_COMMUNITY): Payer: Medicare Other

## 2015-03-19 ENCOUNTER — Encounter (HOSPITAL_COMMUNITY): Payer: Medicare Other

## 2015-03-21 ENCOUNTER — Encounter (HOSPITAL_COMMUNITY): Payer: Medicare Other

## 2015-03-23 ENCOUNTER — Encounter (HOSPITAL_COMMUNITY): Payer: Medicare Other

## 2015-03-26 ENCOUNTER — Encounter (HOSPITAL_COMMUNITY): Payer: Medicare Other

## 2015-03-28 ENCOUNTER — Encounter (HOSPITAL_COMMUNITY): Payer: Medicare Other

## 2015-03-30 ENCOUNTER — Encounter (HOSPITAL_COMMUNITY): Payer: Medicare Other

## 2015-03-30 DIAGNOSIS — J343 Hypertrophy of nasal turbinates: Secondary | ICD-10-CM | POA: Diagnosis not present

## 2015-03-30 DIAGNOSIS — R040 Epistaxis: Secondary | ICD-10-CM | POA: Diagnosis not present

## 2015-03-30 DIAGNOSIS — H6123 Impacted cerumen, bilateral: Secondary | ICD-10-CM | POA: Diagnosis not present

## 2015-04-02 ENCOUNTER — Encounter (HOSPITAL_COMMUNITY): Payer: Medicare Other

## 2015-04-03 DIAGNOSIS — F411 Generalized anxiety disorder: Secondary | ICD-10-CM | POA: Diagnosis not present

## 2015-04-03 DIAGNOSIS — Z0000 Encounter for general adult medical examination without abnormal findings: Secondary | ICD-10-CM | POA: Diagnosis not present

## 2015-04-03 DIAGNOSIS — Q231 Congenital insufficiency of aortic valve: Secondary | ICD-10-CM | POA: Diagnosis not present

## 2015-04-04 ENCOUNTER — Encounter (HOSPITAL_COMMUNITY): Payer: Medicare Other

## 2015-04-06 ENCOUNTER — Encounter (HOSPITAL_COMMUNITY): Payer: Medicare Other

## 2015-04-09 ENCOUNTER — Encounter (HOSPITAL_COMMUNITY): Payer: Medicare Other

## 2015-04-09 ENCOUNTER — Telehealth: Payer: Self-pay | Admitting: Cardiovascular Disease

## 2015-04-09 ENCOUNTER — Telehealth: Payer: Self-pay | Admitting: Internal Medicine

## 2015-04-09 MED ORDER — ALPRAZOLAM 0.5 MG PO TABS: 0.5000 mg | ORAL_TABLET | Freq: Every day | ORAL | Status: DC

## 2015-04-09 NOTE — Telephone Encounter (Signed)
Dr Hilarie Fredrickson- May I refill xanax per patient request? Script sent 09/11/14 for #120 w/2 refills. Sig was for 1 tablet at bedtime with additional dose permitted for stress or abdominal pain related to IBS.

## 2015-04-09 NOTE — Telephone Encounter (Signed)
Pt in [place] and has a question re something Dr. Burt Knack said to her during her last visit last month,  Can be reached until 1145am-she is aware he is in clinic

## 2015-04-09 NOTE — Telephone Encounter (Signed)
Rx sent 

## 2015-04-09 NOTE — Telephone Encounter (Signed)
Dr Burt Knack spoke with the pt. The pt called to question if she should continue seeing a Cardiologist in [place].  The pt requested that her records be sent to Dr Loreli Slot in [place] phone [phone], fax [phone].

## 2015-04-09 NOTE — Telephone Encounter (Signed)
Ok, this is a chronic med for her and we have discussed this in clinic

## 2015-04-10 DIAGNOSIS — C50411 Malignant neoplasm of upper-outer quadrant of right female breast: Secondary | ICD-10-CM | POA: Diagnosis not present

## 2015-04-10 DIAGNOSIS — Z170 Estrogen receptor positive status [ER+]: Secondary | ICD-10-CM | POA: Diagnosis not present

## 2015-04-11 ENCOUNTER — Encounter (HOSPITAL_COMMUNITY): Payer: Medicare Other

## 2015-04-13 ENCOUNTER — Encounter (HOSPITAL_COMMUNITY): Payer: Medicare Other

## 2015-04-16 ENCOUNTER — Encounter (HOSPITAL_COMMUNITY): Payer: Medicare Other

## 2015-04-18 ENCOUNTER — Encounter (HOSPITAL_COMMUNITY): Payer: Medicare Other

## 2015-04-20 ENCOUNTER — Encounter (HOSPITAL_COMMUNITY): Payer: Medicare Other

## 2015-04-25 ENCOUNTER — Encounter (HOSPITAL_COMMUNITY): Payer: Medicare Other

## 2015-04-26 ENCOUNTER — Encounter: Payer: Self-pay | Admitting: *Deleted

## 2015-04-27 ENCOUNTER — Encounter (HOSPITAL_COMMUNITY): Payer: Medicare Other

## 2015-04-27 ENCOUNTER — Telehealth: Payer: Self-pay | Admitting: Internal Medicine

## 2015-04-27 NOTE — Telephone Encounter (Signed)
Pt request to speak to the assistant concern about her medication, she just want to make sure that there will not be a problem when she request refill next year because she transfer from Dr. Asa Lente to Dr. Quay Burow. Please call her this morning if possible.

## 2015-04-27 NOTE — Telephone Encounter (Signed)
Spoke with pt to inform that she could call in when refills were needed.

## 2015-05-17 DIAGNOSIS — R0989 Other specified symptoms and signs involving the circulatory and respiratory systems: Secondary | ICD-10-CM | POA: Diagnosis not present

## 2015-05-17 DIAGNOSIS — R9431 Abnormal electrocardiogram [ECG] [EKG]: Secondary | ICD-10-CM | POA: Diagnosis not present

## 2015-05-17 DIAGNOSIS — E782 Mixed hyperlipidemia: Secondary | ICD-10-CM | POA: Diagnosis not present

## 2015-05-17 DIAGNOSIS — K219 Gastro-esophageal reflux disease without esophagitis: Secondary | ICD-10-CM | POA: Diagnosis not present

## 2015-05-28 DIAGNOSIS — H34831 Tributary (branch) retinal vein occlusion, right eye, with macular edema: Secondary | ICD-10-CM | POA: Diagnosis not present

## 2015-05-28 DIAGNOSIS — H35373 Puckering of macula, bilateral: Secondary | ICD-10-CM | POA: Diagnosis not present

## 2015-07-04 DIAGNOSIS — L853 Xerosis cutis: Secondary | ICD-10-CM | POA: Diagnosis not present

## 2015-07-04 DIAGNOSIS — L818 Other specified disorders of pigmentation: Secondary | ICD-10-CM | POA: Diagnosis not present

## 2015-07-04 DIAGNOSIS — I781 Nevus, non-neoplastic: Secondary | ICD-10-CM | POA: Diagnosis not present

## 2015-07-04 DIAGNOSIS — R208 Other disturbances of skin sensation: Secondary | ICD-10-CM | POA: Diagnosis not present

## 2015-07-04 DIAGNOSIS — L821 Other seborrheic keratosis: Secondary | ICD-10-CM | POA: Diagnosis not present

## 2015-07-04 DIAGNOSIS — L259 Unspecified contact dermatitis, unspecified cause: Secondary | ICD-10-CM | POA: Diagnosis not present

## 2015-08-06 ENCOUNTER — Telehealth: Payer: Self-pay | Admitting: Cardiovascular Disease

## 2015-08-06 NOTE — Telephone Encounter (Signed)
New message     Patient calling does she need to be set up for an echo.

## 2015-08-06 NOTE — Telephone Encounter (Signed)
Dr Burt Knack notes on echo report from Nov 2016-anticipate repeat echo in 1 year.   LMTCB for pt.

## 2015-08-07 NOTE — Telephone Encounter (Signed)
I left a message on the pt's voicemail that she will be due for repeat Echo in November 2017.

## 2015-08-07 NOTE — Telephone Encounter (Signed)
LMTCB for pt 

## 2015-08-09 ENCOUNTER — Telehealth: Payer: Self-pay | Admitting: Internal Medicine

## 2015-08-09 NOTE — Telephone Encounter (Signed)
Left voicemail for patient to call back. I do not see where she has been on omeprazole before (or any PPI in quite some time).

## 2015-08-13 MED ORDER — ALPRAZOLAM 0.5 MG PO TABS: 0.5000 mg | ORAL_TABLET | Freq: Every day | ORAL | Status: DC

## 2015-08-13 NOTE — Telephone Encounter (Signed)
Patient needs refills on alprazolam, not omeprazole as relayed to me originally. Patient is on Xanax chronically. She will need an office visit. Dr Hilarie Fredrickson, may I give refill of Xanax until patient is seen in office?

## 2015-08-13 NOTE — Telephone Encounter (Signed)
Yes, can refill 

## 2015-08-13 NOTE — Telephone Encounter (Signed)
I have left a voicemail for patient to call back. She needs to schedule office visit for further refills. Rx for xanax sent until office visit.

## 2015-08-15 ENCOUNTER — Telehealth: Payer: Self-pay | Admitting: Internal Medicine

## 2015-08-15 NOTE — Telephone Encounter (Signed)
I have called and spoken to [facility], Software engineer at [facility] in [place]. He states they never got my script. He will give  #90 tablets of alprazolam for patient. Verbal consent given.

## 2015-08-22 DIAGNOSIS — K625 Hemorrhage of anus and rectum: Secondary | ICD-10-CM | POA: Diagnosis not present

## 2015-08-22 DIAGNOSIS — R05 Cough: Secondary | ICD-10-CM | POA: Diagnosis not present

## 2015-08-27 DIAGNOSIS — K625 Hemorrhage of anus and rectum: Secondary | ICD-10-CM | POA: Diagnosis not present

## 2015-08-28 ENCOUNTER — Telehealth: Payer: Self-pay | Admitting: Internal Medicine

## 2015-08-28 NOTE — Telephone Encounter (Signed)
Please advise, I dont see on pts med list.

## 2015-08-28 NOTE — Telephone Encounter (Signed)
Pt request Chloral hydrate oral solution '100mg'$ /ml total 900 mls to be send [facility]. Please help

## 2015-08-29 ENCOUNTER — Other Ambulatory Visit: Payer: Self-pay

## 2015-08-29 MED ORDER — CHLORAL HYDRATE CRYS: CRYSTALS | Status: DC

## 2015-08-29 MED ORDER — METOPROLOL TARTRATE 25 MG PO TABS: 12.5000 mg | ORAL_TABLET | Freq: Two times a day (BID) | ORAL | Status: DC

## 2015-08-29 NOTE — Telephone Encounter (Signed)
Does she take this medication nightly?  What dose is she currently taking?

## 2015-08-29 NOTE — Telephone Encounter (Signed)
Spoke with pt, Pt states she take this medication nightly. Pt take 10 mL nightly/ 900 mL bottle. 2 tsp per night. With 1 refill  RX called to pharm

## 2015-08-29 NOTE — Telephone Encounter (Signed)
[name], MD at 03/12/2015 9:57 AM  metoprolol tartrate (LOPRESSOR) 25 MG tabletTake 0.5 tablets (12.5 mg total) by mouth 2 (two) times daily Current medicines are reviewed with the patient today. The patient does not have concerns regarding medicines.  Labs/ tests ordered today include:  No orders of the defined types were placed in this encounter  Patient Instructions     Medication Instructions:  Your physician recommends that you continue on your current medications as directed. Please refer to the Current Medication list given to you today.

## 2015-08-31 ENCOUNTER — Encounter: Payer: Self-pay | Admitting: Cardiovascular Disease

## 2015-08-31 DIAGNOSIS — Z170 Estrogen receptor positive status [ER+]: Secondary | ICD-10-CM | POA: Diagnosis not present

## 2015-08-31 DIAGNOSIS — C50411 Malignant neoplasm of upper-outer quadrant of right female breast: Secondary | ICD-10-CM | POA: Diagnosis not present

## 2015-08-31 DIAGNOSIS — K625 Hemorrhage of anus and rectum: Secondary | ICD-10-CM | POA: Diagnosis not present

## 2015-09-04 ENCOUNTER — Telehealth: Payer: Self-pay | Admitting: Cardiovascular Disease

## 2015-09-04 NOTE — Telephone Encounter (Signed)
New message  Pt called states that her potassium levels are fairly high. Request a call back to determine if she will need to be reduced. Lab results are being faxed from Dr. Wilber Bihari office. Oncologist with [facility].

## 2015-09-04 NOTE — Telephone Encounter (Signed)
Left message on machine for pt to contact the office.   Fax has not been received at this time. I reviewed the pt's current medication list in EPIC and she is not taking any drugs that increase potassium.  The pt should focus of dietary changes to avoid foods that are high in potassium.

## 2015-09-05 DIAGNOSIS — K625 Hemorrhage of anus and rectum: Secondary | ICD-10-CM | POA: Diagnosis not present

## 2015-09-05 DIAGNOSIS — K648 Other hemorrhoids: Secondary | ICD-10-CM | POA: Diagnosis not present

## 2015-09-06 NOTE — Telephone Encounter (Signed)
Left message on machine for pt to contact the office.   

## 2015-09-07 NOTE — Telephone Encounter (Signed)
Follow up    Pt is returning call for pt

## 2015-09-07 NOTE — Telephone Encounter (Signed)
I spoke with the pt and made her aware that I did receive a copy of her lab work showing potassium level of 5.3. The pt is taking an OTC potassium supplement and I advised her to stop this supplement.  The pt also asked if she should continue Co Enzyme Q10.  I made the pt aware that we had not advised her to start this supplement and typically we recommend this with statin use to help decrease side effects.  The pt is not on a statin.  The pt denies muscle and joint aches and pains and she plans to stop this supplement.  The pt also requested that I mail her an SBE card to her home and this will be sent to her [place] address.

## 2015-09-12 ENCOUNTER — Encounter: Payer: Self-pay | Admitting: Internal Medicine

## 2015-09-12 ENCOUNTER — Ambulatory Visit (INDEPENDENT_AMBULATORY_CARE_PROVIDER_SITE_OTHER): Payer: Medicare Other | Admitting: Internal Medicine

## 2015-09-12 VITALS — BP 108/64 | HR 51 | Temp 97.8°F | Resp 16 | Wt 160.0 lb

## 2015-09-12 DIAGNOSIS — G4700 Insomnia, unspecified: Secondary | ICD-10-CM | POA: Diagnosis not present

## 2015-09-12 DIAGNOSIS — K625 Hemorrhage of anus and rectum: Secondary | ICD-10-CM | POA: Diagnosis not present

## 2015-09-12 NOTE — Patient Instructions (Signed)
   All other Health Maintenance issues reviewed.   All recommended immunizations and age-appropriate screenings are up-to-date or discussed.  No immunizations administered today.   Medications reviewed and updated.  No changes recommended at this time.

## 2015-09-12 NOTE — Progress Notes (Signed)
Subjective:    Patient ID: [name], female    DOB: 11-12-1932, 82 y.o.   MRN: [id number]  HPI She is here for an acute visit.   She had some blood in the toilet bowl that was bright red about one month ago.  She was in [place] at the time.  She saw her doctor there.  She did a guaiac card and it was negative and blood work was normal.   She saw GI there.  She had a sigmoidoscopy last week and it was normal.  She was advised to do cologuard and received the kit last week - it was complicated and she understands it can be falsely negative or positive.  She does not want to go through with it.  She has not seen blood since.  She has not had any abdominal pain, nausea, change in appetite or weight.  She takes benefiber daily and denies constipation and diarrhea.  Her last colonoscopy was at 32 and it was normal.  She has always had normal colonoscopies.  Her mother had colorectal cancer at age 22.  She has an appointment with Dr Hilarie Fredrickson next month.  She does not want to have a colonoscopy.  Insomnia:  She has been taking her current medication, chloral hydrate for years.  She has never tried anything else.  She only takes a small amount every night.  She denies side effects.      Medications and allergies reviewed with patient and updated if appropriate.  Patient Active Problem List   Diagnosis Date Noted  . S/P AVR 11/16/2014  . Severe calcific aortic stenosis 10/17/2014  . Aortic valve stenosis, severe   . ALLERGIC RHINITIS DUE TO OTHER ALLERGEN 09/10/2009  . Borderline hyperlipidemia 11/30/2006  . HYPOGLYCEMIA NEC 11/20/2006  . MITRAL VALVE PROLAPSE 10/29/2006  . Osteopenia 10/29/2006  . Breast cancer, right breast ([place]) 10/29/2006    Current Outpatient Prescriptions on File Prior to Visit  Medication Sig Dispense Refill  . acetaminophen (TYLENOL) 500 MG tablet Take 500-1,000 mg by mouth at bedtime as needed for mild pain or moderate pain.    Marland Kitchen ALPRAZolam (XANAX) 0.5 MG  tablet Take 1 tablet (0.5 mg total) by mouth at bedtime. May take additional dose as needed for stress or abdominal pain related to IBS. 120 tablet 0  . aspirin EC 81 MG tablet Take 1 tablet (81 mg total) by mouth daily.    . Calcium Carb-Cholecalciferol (CALCIUM 600+D3) 600-400 MG-UNIT TABS Take 1 tablet by mouth daily.    . Chloral Hydrate CRYS Take 10 mL QHS as needed for sleep. Called in Liquid to [facility]. 2 Bottle 1  . Cholecalciferol (VITAMIN D-3) 1000 UNITS CAPS Take 1 capsule by mouth daily.     . clindamycin (CLEOCIN) 300 MG capsule Take 2 tablets one hour prior to dental procedure 6 capsule 3  . clobetasol ointment (TEMOVATE) 7.03 % Apply 1 application topically 2 (two) times daily.     . Magnesium 400 MG CAPS Take 400 mg by mouth every morning.     . metoprolol tartrate (LOPRESSOR) 25 MG tablet Take 0.5 tablets (12.5 mg total) by mouth 2 (two) times daily. 30 tablet 1  . Ophthalmic Irrigation Solution (OCUSOFT EYE [place] OP) Apply 1 application to eye 2 (two) times daily. Morning and before bed    . Polyethyl Glycol-Propyl Glycol (SYSTANE ULTRA PF) 0.4-0.3 % SOLN Apply 1 drop to eye 2 (two) times daily.    Marland Kitchen  Red Yeast Rice Extract ([employer] RED YEAST RICE PO) Take 600 mg by mouth daily.     . Wheat Dextrin (BENEFIBER DRINK MIX PO) Take 5 mLs by mouth every morning. Mix with 1/2 a glass water, and takes with vitamins     No current facility-administered medications on file prior to visit.    Past Medical History  Diagnosis Date  . Hypercholesteremia   . Osteopenia   . Ulcer 2012    bleeding gastric Ulcer  . Breast cancer ([place])     right  . Aortic valve stenosis, severe   . Hiatal hernia   . Hypoglycemia     Past Surgical History  Procedure Laterality Date  . Breast surgery    . Tonsillectomy    . Parathyroidectomy    . Cosmetic surgery      face  . Cardiac catheterization N/A 10/18/2014    Procedure: Right/Left Heart Cath and Coronary Angiography;  Surgeon: [name], MD;  Location: [facility];  Service: Cardiovascular;  Laterality: N/A;  . Aortic valve replacement N/A 11/16/2014    Procedure: AORTIC VALVE REPLACEMENT (AVR);  Surgeon: Gaye Pollack, MD;  Location: [facility];  Service: Open Heart Surgery;  Laterality: N/A;  . Tee without cardioversion N/A 11/16/2014    Procedure: TRANSESOPHAGEAL ECHOCARDIOGRAM (TEE);  Surgeon: Gaye Pollack, MD;  Location: [facility];  Service: Open Heart Surgery;  Laterality: N/A;    Social History   Social History  . Marital Status: Married    Spouse Name: N/A  . Number of Children: N/A  . Years of Education: N/A   Social History Main Topics  . Smoking status: Former Smoker -- 0.50 packs/day for 30 years    Types: Cigarettes  . Smokeless tobacco: Never Used     Comment: quit smoking 1980  . Alcohol Use: Not on file  . Drug Use: No  . Sexual Activity: Not on file   Other Topics Concern  . Not on file   Social History Narrative    Family History  Problem Relation Age of Onset  . Hypertension Mother   . Rectal cancer Mother   . Kidney disease Father     Review of Systems  Constitutional: Negative for fever, chills, appetite change and unexpected weight change.  Gastrointestinal: Negative for nausea, abdominal pain, diarrhea and constipation.  Neurological: Negative for light-headedness and headaches.       Objective:   Filed Vitals:   09/12/15 1627  BP: 108/64  Pulse: 51  Temp: 97.8 F (36.6 C)  Resp: 16   Filed Weights   09/12/15 1627  Weight: 160 lb (72.576 kg)   Body mass index is 26.63 kg/(m^2).   Physical Exam  Constitutional: She appears well-developed and well-nourished. No distress.  Abdominal: Soft. She exhibits no distension and no mass. There is no tenderness. There is no rebound and no guarding.  Skin: She is not diaphoretic.          Assessment & Plan:   See Problem List for Assessment and Plan of chronic medical problems.

## 2015-09-12 NOTE — Assessment & Plan Note (Signed)
One episode of BRBPR 4/17, no recurrences Work up in [facility] included blood work, guaiac and flex sigmoidoscopy all negative Will see Dr Hilarie Fredrickson next month Will hold off on cologuard given possible false positives / false negatives Will monitor for further episodes

## 2015-09-12 NOTE — Assessment & Plan Note (Signed)
Taking chloral hydrate nightly - has been on this medication for years and has never tried anything else No side effects, taking appopriately Will conitnue

## 2015-09-12 NOTE — Progress Notes (Signed)
Pre visit review using our clinic review tool, if applicable. No additional management support is needed unless otherwise documented below in the visit note. 

## 2015-10-01 ENCOUNTER — Telehealth: Payer: Self-pay | Admitting: Nurse Practitioner

## 2015-10-01 NOTE — Telephone Encounter (Signed)
Left message for patient re 7/10 LTS visit. Schedule mailed.

## 2015-10-16 ENCOUNTER — Encounter: Payer: Self-pay | Admitting: *Deleted

## 2015-10-17 ENCOUNTER — Telehealth: Payer: Self-pay | Admitting: Internal Medicine

## 2015-10-17 NOTE — Telephone Encounter (Signed)
We do have labwork.from May (under "media" in EPIC). Patient states she also had sigmoidoscopy in [place], [place]. Dr office phone, [phone]. We will attempt to get those records.

## 2015-10-23 DIAGNOSIS — L723 Sebaceous cyst: Secondary | ICD-10-CM | POA: Diagnosis not present

## 2015-10-23 DIAGNOSIS — L298 Other pruritus: Secondary | ICD-10-CM | POA: Diagnosis not present

## 2015-10-23 DIAGNOSIS — L821 Other seborrheic keratosis: Secondary | ICD-10-CM | POA: Diagnosis not present

## 2015-10-24 ENCOUNTER — Ambulatory Visit (INDEPENDENT_AMBULATORY_CARE_PROVIDER_SITE_OTHER): Payer: Medicare Other | Admitting: Internal Medicine

## 2015-10-24 ENCOUNTER — Encounter: Payer: Self-pay | Admitting: Internal Medicine

## 2015-10-24 VITALS — BP 112/62 | HR 74 | Ht 64.5 in | Wt 158.0 lb

## 2015-10-24 DIAGNOSIS — K5909 Other constipation: Secondary | ICD-10-CM

## 2015-10-24 DIAGNOSIS — K5900 Constipation, unspecified: Secondary | ICD-10-CM

## 2015-10-24 DIAGNOSIS — K648 Other hemorrhoids: Secondary | ICD-10-CM | POA: Diagnosis not present

## 2015-10-24 DIAGNOSIS — K589 Irritable bowel syndrome without diarrhea: Secondary | ICD-10-CM | POA: Diagnosis not present

## 2015-10-24 MED ORDER — ALPRAZOLAM 0.5 MG PO TABS: 0.5000 mg | ORAL_TABLET | Freq: Every day | ORAL | Status: DC

## 2015-10-24 NOTE — Progress Notes (Signed)
Subjective:    Patient ID: [name], female    DOB: [birth year], 82 y.o.   MRN: [id number]  HPI Jaianna Nicoll is an 82 year old female with past medical history PUD, IBS, aortic stenosis with bicuspid aortic valve status post aortic valve replacement last summer with Dr. Cyndia Bent, remote history of breast cancer who is here for follow-up. She was last seen in May 2016. She reports she has been doing very well. They spent the winter in [facility]. In May she had a single episode of rectal bleeding which was painless following a bowel movement. This led to her being evaluated by Dr. Elly Modena in [place]. She had blood work which was reportedly normal. She also had a flexible sigmoidoscopy which was completed on 09/05/2015. This was normal except for medium sized internal hemorrhoids. He had recommended Cologuard in her words, "just to be absolutely safe". She has not returned this and states she is not interested in doing so. He has had no further rectal bleeding. Bowel movements have been daily and regular. No melena. No significant diarrhea or constipation. She is using a fiber daily. She reports she is feeling well with good energy level. Her irritable bowel is mostly in check she denies nausea, vomiting, dysphagia and early satiety. She does use Xanax on occasion long-term which helps with her anxiety and also IBS.  Her last colonoscopy was in 2009 by Dr. Henrene Pastor. This was normal. She has never had a history of colon polyps. Her mother had colon cancer at age 50 but she is uncertain how this was diagnosed.   Review of Systems As per history of present illness, otherwise negative  Current Medications, Allergies, Past Medical History, Past Surgical History, Family History and Social History were reviewed in [employer] record.     Objective:   Physical Exam BP 112/62 mmHg  Pulse 74  Ht 5' 4.5" (1.638 m)  Wt 158 lb (71.668 kg)  BMI 26.71  kg/m2 Constitutional: Well-developed and well-nourished. No distress. HEENT: Normocephalic and atraumatic. Oropharynx is clear and moist. No oropharyngeal exudate. Conjunctivae are normal.  No scleral icterus. Neck: Neck supple. Trachea midline. Cardiovascular: Normal rate, regular rhythm and intact distal pulses. SEM 2/6 Pulmonary/chest: Effort normal and breath sounds normal. No wheezing, rales or rhonchi. Abdominal: Soft, nontender, nondistended. Bowel sounds active throughout. There are no masses palpable. No hepatosplenomegaly. Extremities: no clubbing, cyanosis, or edema Lymphadenopathy: No cervical adenopathy noted. Neurological: Alert and oriented to person place and time. Skin: Skin is warm and dry. No rashes noted. Psychiatric: Normal mood and affect. Behavior is normal.  Records from [facility] reviewed.  Will be scanned into our records.     Assessment & Plan:  82 year old female with past medical history PUD, IBS, aortic stenosis with bicuspid aortic valve status post aortic valve replacement last summer with Dr. Cyndia Bent, remote history of breast cancer who is here for follow-up.  1. Isolated episode of rectal bleeding -- evaluated in [place] with flexible sigmoidoscopy. Found to have internal hemorrhoids which is most likely the source of her isolated rectal bleeding. This has not recurred. Cologuard was ordered by Dr. Lissa Merlin in [place]. We have discussed this at length today. She does not wish to undergo further colorectal cancer screening in the form of colonoscopy. We discussed how there is a small chance something could be missed that could impact her life. She understands this and states she is willing to take this risk. I'm comfortable with her decision. For  now she plans to not return Cologuard. I told her that if she changes her mind or this is weighing heavily on her mind she can return the Cologuard. She understands if positive colonoscopy would be recommended. She states if she did  return it and it was positive she would be willing to proceed. She is asked to notify me if recurrent rectal bleeding.  2. IBS -- long-standing with predictable symptoms driven by stress and anxiety. Continue Xanax 0.5 mg twice a day when necessary.  3. Constipation -- in very good control on Benefiber daily. Continue Benefiber daily.  One year follow-up, sooner if necessary 25 minutes spent with the patient today. Greater than 50% was spent in counseling and coordination of care with the patient

## 2015-10-24 NOTE — Patient Instructions (Signed)
We have sent the following medications to your pharmacy for you to pick up at your convenience: Xanax  Continue Benefiber.  Please follow up with Dr Hilarie Fredrickson in 1 year.  If you are age 80 or older, your body mass index should be between 23-30. Your Body mass index is 26.71 kg/(m^2). If this is out of the aforementioned range listed, please consider follow up with your Primary Care Provider.  If you are age 39 or younger, your body mass index should be between 19-25. Your Body mass index is 26.71 kg/(m^2). If this is out of the aformentioned range listed, please consider follow up with your Primary Care Provider.

## 2015-10-31 ENCOUNTER — Encounter: Payer: Self-pay | Admitting: Internal Medicine

## 2015-11-01 ENCOUNTER — Encounter: Payer: Self-pay | Admitting: Cardiovascular Disease

## 2015-11-01 ENCOUNTER — Ambulatory Visit (INDEPENDENT_AMBULATORY_CARE_PROVIDER_SITE_OTHER): Payer: Medicare Other | Admitting: Cardiovascular Disease

## 2015-11-01 ENCOUNTER — Encounter (INDEPENDENT_AMBULATORY_CARE_PROVIDER_SITE_OTHER): Payer: Self-pay

## 2015-11-01 VITALS — BP 120/64 | HR 48 | Ht 65.0 in | Wt 158.8 lb

## 2015-11-01 DIAGNOSIS — Z952 Presence of prosthetic heart valve: Secondary | ICD-10-CM

## 2015-11-01 DIAGNOSIS — I351 Nonrheumatic aortic (valve) insufficiency: Secondary | ICD-10-CM

## 2015-11-01 DIAGNOSIS — Z954 Presence of other heart-valve replacement: Secondary | ICD-10-CM

## 2015-11-01 NOTE — Patient Instructions (Signed)
Medication Instructions:  Your physician has recommended you make the following change in your medication:  1. STOP Metoprolol Tartrate   Labwork: No new orders.   Testing/Procedures: Your physician has requested that you have an echocardiogram. Echocardiography is a painless test that uses sound waves to create images of your heart. It provides your doctor with information about the size and shape of your heart and how well your heart's chambers and valves are working. This procedure takes approximately one hour. There are no restrictions for this procedure.  Follow-Up: Your physician wants you to follow-up in: June 2018.  You will receive a reminder letter in the mail two months in advance. If you don't receive a letter, please call our office to schedule the follow-up appointment.   Any Other Special Instructions Will Be Listed Below (If Applicable).     If you need a refill on your cardiac medications before your next appointment, please call your pharmacy.

## 2015-11-01 NOTE — Progress Notes (Signed)
Cardiology Office Note Date:  11/01/2015   ID:  [name], DOB 1932/12/25, MRN [id number]  PCP:  [name], MD  Cardiologist:  [name], MD    Chief Complaint  Patient presents with  . Aortic Stenosis    severe  . Mitral Valve Prolapse     History of Present Illness: [name] is a 82 y.o. female who presents for follow-up evaluation. She underwent bioprosthetic aortic valve replacement 11/16/2014 for treatment of severe bicuspid aortic stenosis. She has bileaflet mitral valve prolapse with mild mitral regurgitation. She was treated with a 21 mm Edwards magna ease pericardial valve.  The patient has been doing well. She is physically active without any regular exertional symptoms. She did have an episode of shortness of breath after a meal when she was walking uphill. This occurred about 2 weeks ago. It resolved with rest. She's had no other problems and had similar activity since then without symptoms. She has no chest pain or pressure, orthopnea, PND, or leg swelling. She reports no medication changes. She has no symptoms of presyncope. She has had dizziness and attributes this to vertigo which she has had several times over the years.  Past Medical History  Diagnosis Date  . Hypercholesteremia   . Osteopenia   . Ulcer 2012    bleeding gastric Ulcer  . Breast cancer ([place])     right  . Aortic valve stenosis, severe   . Hiatal hernia   . Hypoglycemia     Past Surgical History  Procedure Laterality Date  . Breast surgery    . Tonsillectomy    . Parathyroidectomy    . Cosmetic surgery      face  . Cardiac catheterization N/A 10/18/2014    Procedure: Right/Left Heart Cath and Coronary Angiography;  Surgeon: [name], MD;  Location: [facility];  Service: Cardiovascular;  Laterality: N/A;  . Aortic valve replacement N/A 11/16/2014    Procedure: AORTIC VALVE REPLACEMENT (AVR);  Surgeon: Gaye Pollack, MD;  Location: [facility];  Service: Open  Heart Surgery;  Laterality: N/A;  . Tee without cardioversion N/A 11/16/2014    Procedure: TRANSESOPHAGEAL ECHOCARDIOGRAM (TEE);  Surgeon: Gaye Pollack, MD;  Location: [facility];  Service: Open Heart Surgery;  Laterality: N/A;    Current Outpatient Prescriptions  Medication Sig Dispense Refill  . acetaminophen (TYLENOL) 500 MG tablet Take 500-1,000 mg by mouth at bedtime as needed for mild pain or moderate pain.    Marland Kitchen ALPRAZolam (XANAX) 0.5 MG tablet Take 1 tablet (0.5 mg total) by mouth at bedtime. May take additional dose as needed for stress or abdominal pain related to IBS. 120 tablet 0  . aspirin EC 81 MG tablet Take 1 tablet (81 mg total) by mouth daily.    . Calcium Carb-Cholecalciferol (CALCIUM 600+D3) 600-400 MG-UNIT TABS Take 1 tablet by mouth daily.    . Chloral Hydrate CRYS Take 10 mLs by mouth at bedtime as needed (Call  liquid into [place]).     . Cholecalciferol (VITAMIN D-3) 1000 UNITS CAPS Take 1 capsule by mouth daily.     . clindamycin (CLEOCIN) 300 MG capsule Take 600 mg by mouth as directed. Take 600 mg (2 capsules) by mouth one hour prior to dental procedures    . Magnesium 400 MG CAPS Take 400 mg by mouth every morning.     . metoprolol tartrate (LOPRESSOR) 25 MG tablet Take 0.5 tablets (12.5 mg total) by mouth  2 (two) times daily. 30 tablet 1  . Ophthalmic Irrigation Solution (OCUSOFT EYE [place] OP) Apply 1 application to eye 2 (two) times daily as needed (dry eyes). Morning and before bed    . Polyethyl Glycol-Propyl Glycol (SYSTANE ULTRA PF) 0.4-0.3 % SOLN Apply 1 drop to eye 2 (two) times daily.    . Red Yeast Rice Extract (CVS RED YEAST RICE PO) Take 600 mg by mouth daily.     . Wheat Dextrin (BENEFIBER DRINK MIX PO) Take 5 mLs by mouth every morning. Mix with 1/2 a glass water, and takes with vitamins     No current facility-administered medications for this visit.    Allergies:   Amoxicillin-pot clavulanate; Penicillin g; Penicillins;  Sulfamethoxazole; and Sulfonamide derivatives   Social History:  The patient  reports that she has quit smoking. Her smoking use included Cigarettes. She has a 15 pack-year smoking history. She has never used smokeless tobacco. She reports that she does not use illicit drugs.   Family History:  The patient's  family history includes Colon cancer in her mother; Hypertension in her mother; Kidney disease in her father; Rectal cancer in her mother.    ROS:  Please see the history of present illness.  Otherwise, review of systems is positive for Hearing loss, dizziness, constipation.  All other systems are reviewed and negative.    PHYSICAL EXAM: VS:  BP 120/64 mmHg  Pulse 48  Ht '5\' 5"'$  (1.651 m)  Wt 158 lb 12.8 oz (72.031 kg)  BMI 26.43 kg/m2 , BMI Body mass index is 26.43 kg/(m^2). GEN: Well nourished, well developed, in no acute distress HEENT: normal Neck: no JVD, no masses. No carotid bruits Cardiac: Bradycardic and regular with grade 3/6 diastolic decrescendo murmur best heard at the LLSB              Respiratory:  clear to auscultation bilaterally, normal work of breathing GI: soft, nontender, nondistended, + BS MS: no deformity or atrophy Ext: no pretibial edema, pedal pulses 2+= bilaterally Skin: warm and dry, no rash Neuro:  Strength and sensation are intact Psych: euthymic mood, full affect  EKG:  EKG is ordered today. The ekg ordered today shows marked sinus bradycardia 46 bpm, otherwise within normal limits.  Recent Labs: 11/17/2014: Magnesium 1.5* 12/15/2014: Hemoglobin 11.9*; Platelets 246 02/21/2015: ALT 18; BUN 15; Creatinine, Ser 0.82; Potassium 4.5; Sodium 140   Lipid Panel     Component Value Date/Time   CHOL 262* 02/21/2015 0913   TRIG 80.0 02/21/2015 0913   HDL 79.40 02/21/2015 0913   CHOLHDL 3 02/21/2015 0913   VLDL 16.0 02/21/2015 0913   LDLCALC 166* 02/21/2015 0913   LDLDIRECT 144.0 11/29/2012 1601      Wt Readings from Last 3 Encounters:  11/01/15  158 lb 12.8 oz (72.031 kg)  10/24/15 158 lb (71.668 kg)  09/12/15 160 lb (72.576 kg)     Cardiac Studies Reviewed: 2D Echo 03-12-2015: Study Conclusions  - Left ventricle: The cavity size was normal. Systolic function was  normal. The estimated ejection fraction was in the range of 55%  to 60%. Wall motion was normal; there were no regional wall  motion abnormalities. Features are consistent with a pseudonormal  left ventricular filling pattern, with concomitant abnormal  relaxation and increased filling pressure (grade 2 diastolic  dysfunction). - Ventricular septum: Septal motion showed paradox. These changes  are consistent with a post-thoracotomy state. - Aortic valve: A bioprosthesis was present and functioning  normally. The prosthesis had  a normal range of motion. The sewing  ring appeared normal, had no rocking motion, and showed no  evidence of dehiscence. There was mild to moderate regurgitation. - Mitral valve: Mild, late systolicprolapse, involving the anterior  leaflet. There was mild regurgitation directed centrally. - Pulmonary arteries: Systolic pressure was mildly increased. PA  peak pressure: 40 mm Hg (S).   ASSESSMENT AND PLAN: 1.  Aortic valve disease s/p bioprosthetic AVR with paravalvular regurgitation, mild-moderate last echo. Prominent diastolic murmur on exam. Will FU echo. Lengthy discussion about natural history, understands will follow clinically and with serial echo studies.  NYHA I symptoms.  2. Bradycardia: DC metoprolol. Appears to be asymptomatic.  Current medicines are reviewed with the patient today.  The patient does not have concerns regarding medicines.  Labs/ tests ordered today include:  No orders of the defined types were placed in this encounter.    Disposition:   FU one year as long as echo findings are stable  Signed, [name], MD  11/01/2015 10:21 AM    [facility] [place],  [place],   [address] Phone: [phone]; Fax: [phone]

## 2015-11-05 ENCOUNTER — Encounter: Payer: Medicare Other | Admitting: Nurse Practitioner

## 2015-11-05 NOTE — Addendum Note (Signed)
Addended by: Freada Bergeron on: 11/05/2015 05:34 PM   Modules accepted: Orders

## 2015-11-06 NOTE — Progress Notes (Signed)
CLINIC:  Cancer Survivorship   REASON FOR VISIT:  Routine follow-up post-treatment for history of breast cancer.  BRIEF ONCOLOGIC HISTORY:    Breast cancer, right breast ([place])   02/16/2004 Initial Diagnosis Right breast mass biopsy: Invasive mammary cancer, MRI revealed 1.3 cm mass upper inner quadrant right breast   03/05/2004 Surgery Right lumpectomy: 0.9 cm grade 1 invasive lobular cancer, ER positive, PR negative, HER-2 negative, Ki-67 5%, sentinel lymph nodes negative   04/08/2004 - 05/09/2004 Radiation Therapy Adjuvant radiation therapy   06/10/2004 - 06/09/2009 Anti-estrogen oral therapy MA-27 trial and placed on the Aromasin arm. She completed one year of Aromasin, and it was discontinued due to severe joint aches and pains, followed by Tamoxifen for 2-3 years, and then Letrozole to complete out a total of 5 years    INTERVAL HISTORY:  [name] presents to the [facility] today for ongoing follow up regarding her history of breast cancer. Overall, [name] reports doing fairly well since her last visit with Dr. Lindi Adie in 10/2014.  She underwent aortic valve replacement in 10/2014 and has recovered very well.  She has not noticed any change within her breast and her last mammogram was in October 2016 and unremarkable   She denies any headache, cough, shortness of breath, or bone pain. She did have an episode of rectal bleeding while in [place] in May 2017.  She saw her GI doctor there who performed flexible sigmoidoscopy, which was negative.  She has not had additional episodes since that time and has seen her GI doctor here.  She is not interested in proceeding with future colonoscopies as outlined in her discussion with Dr Hilarie Fredrickson on 10/24/2015.  She reports a good appetite and denies any weight loss.  She sees her medical oncologist, Dr. Phillis Knack, in [place] once a year when they are wintering there.  She remains physically active walking every day and playing tennis  when it is not miserable weatherwise (as it is at present with the summer heat and humidity).   REVIEW OF SYSTEMS:  General: Denies fever, chills, unintentional weight loss, or generalized fatigue.  HEENT: Wears glasses.  Occasional sinus problems. Denies visual changes, hearing loss, mouth sores, or difficulty swallowing. Cardiac: As above. Respiratory: Denies wheeze or dyspnea on exertion.  Breast: As above.  GI: As above.  GU: Denies dysuria, hematuria, vaginal bleeding, vaginal discharge, or vaginal dryness.  Musculoskeletal: As above. Neuro: Denies recent fall or numbness / tingling in her extremities.  Skin: Denies rash, pruritis, or open wounds.  Psych: Denies depression, anxiety, insomnia, or memory loss.   A 14-point review of systems was completed and was negative, except as noted above.     PAST MEDICAL/SURGICAL HISTORY:  Past Medical History  Diagnosis Date  . Hypercholesteremia   . Osteopenia   . Ulcer 2012    bleeding gastric Ulcer  . Breast cancer ([place])     right  . Aortic valve stenosis, severe   . Hiatal hernia   . Hypoglycemia    Past Surgical History  Procedure Laterality Date  . Breast surgery    . Tonsillectomy    . Parathyroidectomy    . Cosmetic surgery      face  . Cardiac catheterization N/A 10/18/2014    Procedure: Right/Left Heart Cath and Coronary Angiography;  Surgeon: [name], MD;  Location: [facility];  Service: Cardiovascular;  Laterality: N/A;  . Aortic valve replacement N/A 11/16/2014    Procedure: AORTIC VALVE REPLACEMENT (  AVR);  Surgeon: Gaye Pollack, MD;  Location: [facility];  Service: Open Heart Surgery;  Laterality: N/A;  . Tee without cardioversion N/A 11/16/2014    Procedure: TRANSESOPHAGEAL ECHOCARDIOGRAM (TEE);  Surgeon: Gaye Pollack, MD;  Location: [facility];  Service: Open Heart Surgery;  Laterality: N/A;     ALLERGIES:  Allergies  Allergen Reactions  . Amoxicillin-Pot Clavulanate Hives  . Penicillin G Rash  .  Penicillins Hives  . Sulfamethoxazole Rash  . Sulfonamide Derivatives Hives     CURRENT MEDICATIONS:  Current Outpatient Prescriptions on File Prior to Visit  Medication Sig Dispense Refill  . acetaminophen (TYLENOL) 500 MG tablet Take 500-1,000 mg by mouth at bedtime as needed for mild pain or moderate pain.    Marland Kitchen ALPRAZolam (XANAX) 0.5 MG tablet Take 1 tablet (0.5 mg total) by mouth at bedtime. May take additional dose as needed for stress or abdominal pain related to IBS. 120 tablet 0  . aspirin EC 81 MG tablet Take 1 tablet (81 mg total) by mouth daily.    . Calcium Carb-Cholecalciferol (CALCIUM 600+D3) 600-400 MG-UNIT TABS Take 1 tablet by mouth daily.    . Chloral Hydrate CRYS Take 10 mLs by mouth at bedtime as needed (Call  liquid into [place]).     . Cholecalciferol (VITAMIN D-3) 1000 UNITS CAPS Take 1 capsule by mouth daily.     . clindamycin (CLEOCIN) 300 MG capsule Take 600 mg by mouth as directed. Take 600 mg (2 capsules) by mouth one hour prior to dental procedures    . Magnesium 400 MG CAPS Take 400 mg by mouth every morning.     Marland Kitchen Ophthalmic Irrigation Solution (OCUSOFT EYE [place] OP) Apply 1 application to eye 2 (two) times daily as needed (dry eyes). Morning and before bed    . Polyethyl Glycol-Propyl Glycol (SYSTANE ULTRA PF) 0.4-0.3 % SOLN Apply 1 drop to eye 2 (two) times daily.    . Red Yeast Rice Extract (CVS RED YEAST RICE PO) Take 600 mg by mouth daily.     . Wheat Dextrin (BENEFIBER DRINK MIX PO) Take 5 mLs by mouth every morning. Mix with 1/2 a glass water, and takes with vitamins     No current facility-administered medications on file prior to visit.     ONCOLOGIC FAMILY HISTORY:  Family History  Problem Relation Age of Onset  . Hypertension Mother   . Rectal cancer Mother   . Kidney disease Father   . Colon cancer Mother      GENETIC COUNSELING/TESTING: No   SOCIAL HISTORY:  [name] is married and lives with her spouse in  [place], [place] but spends half the year during the winter in [place].   [name] is currently retired.  She denies any current or history of tobacco, alcohol, or illicit drug use.      PHYSICAL EXAMINATION:  Vital Signs: Filed Vitals:   11/07/15 0934  BP: 128/69  Pulse: 56  Temp: 97.5 F (36.4 C)  Resp: 17   Weight: 157 (down 1 lb since previous visit) ECOG performance status: 0 General: Well-nourished, well-appearing female in no acute distress.  She is unaccompanied in clinic today.   HEENT: Head is atraumatic and normocephalic.  Pupils equal and reactive to light and accomodation. Conjunctivae clear without exudate.  Sclerae anicteric. Oral mucosa is pink, moist, and intact without lesions.  Oropharynx is pink without lesions or erythema.  Lymph: No cervical, supraclavicular, infraclavicular, or axillary lymphadenopathy  noted on palpation.  Cardiovascular: Bradycardic.  Diastolic murmur secondary to aortic valve replacement.  No rub or gallop.   Respiratory: Clear to auscultation bilaterally. Chest expansion symmetric without accessory muscle use on inspiration or expiration.  Breast: Bilateral breast exam performed. Right lumpectomy scar intact without nodularity.  No mass or lesion in either breast. GI: Abdomen soft and round. No tenderness to palpation. Bowel sounds normoactive in 4 quadrants. No hepatosplenomegaly.   GU: Deferred.  Musculoskeletal: Muscle strength 5/5 in all extremities.   Neuro: No focal deficits. Steady gait.  Psych: Mood and affect normal and appropriate for situation.  Extremities: No edema, cyanosis, or clubbing.  Skin: Warm and dry. No open lesions noted.   LABORATORY DATA:  Outside labs performed by Dr. Wilber Bihari office 08/31/2015 shows stable LDH, CEA and CA 27.29.  DIAGNOSTIC IMAGING: Bilateral screening mammogram performed 02/14/2015 reveals breast density category B.  No mass or lesion suspicious for malignancy.  Follow up recommended  in one year.     ASSESSMENT AND PLAN:   1. History of breast cancer: Stage IA invasive lobular carcinoma of the right breast (02/2004), ER positive, PR negative, HER2/neu negative, S/P right lumpectomy.  Ms. Spirito is doing well with no clinical symptoms worrisome for cancer recurrence at this time. I have reviewed the recommendations for ongoing surveillance with her and she will follow-up with [place] in Survivorship in one year's time with history and physical exam per surveillance protocol.  She will be due mammogram in October 2017 and we will enter orders for this to be scheduled following today's visit. She was instructed to make [place] aware if she notes any change within her breast, any new symptoms such as pain, shortness of breath, weight loss, or fatigue.   2. Cancer screening:  Due to [name]'s history and her age, she should receive screening for skin cancers.  As above, she is not interested in pursuing additional colonoscopies at this time.  The information and recommendations were shared with the patient and in her written after visit summary.  3. Health maintenance and wellness promotion:[name] and I discussed recommendations to maximize nutrition and minimize recurrence, such as increased intake of fruits, vegetables, lean proteins, and minimizing the intake of red meats and processed foods.  She was also encouraged to continue to remain active with exercise for 30 minutes per day most days of the week. She was instructed to limit her alcohol consumption and continue to abstain from tobacco use.     4. Support services/counseling:[name] was offered support today through active listening and expressive supportive counseling.     A total of 25 minutes of face-to-face time was spent with this patient with greater than 50% of that time in counseling and care-coordination.   Sylvan Cheese, NP  Survivorship Program [facility] [phone]   Note: PRIMARY CARE PROVIDER [name], [place] [phone]

## 2015-11-06 NOTE — Patient Instructions (Addendum)
Thank you for coming in today!  As we discussed, please continue to perform your self breast exam and report any changes. If you note any new symptoms (please see below), be sure to notify us ASAP.  Your mammogram will be due in October 2017 and we will enter orders for it today ([place] will check to see if it has already been scheduled).  We'll have you return in one year's time for your next appointment or sooner if you have any problems. Please be sure to stop by scheduling on your way out to make those appointment(s).  Let us know if you have any questions!  Symptoms to Watch for and Report to Your Provider  . Return of the cancer symptoms you had before- such as a lump or new growth where your cancer first started . New or unusual pain that seems unrelated to an injury and does not go away, including back pain or bone pain . Weight loss without trying/intending . Unexplained bleeding . A rash or allergic reaction, such as swelling, severe itching or wheezing . Chills or fevers . Persistent headaches . Shortness of breath or difficulty breathing . Bloody stools or blood in your urine . Lumps, bumps, swelling and/or nipple discharge . Nausea, vomiting, diarrhea, loss of appetite, or trouble swallowing . A cough that doesn't go away . Abdominal pain . Swelling in your arms or legs . Fractures . Hot flashes or other menopausal symptoms . Any other signs mentioned by your doctor or nurse or any unusual symptoms                 that you just can't explain   NOTE: Just because you have certain symptoms, it doesn't mean the cancer has come back or you have a new cancer. Symptoms can be due to other problems that need to be addressed.  It is important to watch for these symptoms and report them to your provider so you can be medically evaluated for any of these concerns!    Living a Life of Wellness After Cancer:  *Note: Please consult your health care provider before using any medications,  supplements, over-the-counter products, or other interventions.  Also, please consult your primary care provider before you begin any lifestyle program (diet, exercise, etc.).  Your safety is our top priority and we want to make sure you continue to live a long and healthy life!    Healthy Lifestyle Recommendations  As a cancer survivor, it is important develop a lifelong commitment to a healthy lifestyle. A healthy lifestyle can prevent cancer from returning as well as prevent other diseases like heart disease, diabetes and high blood pressure.  These are some things that you can do to have a healthy lifestyle:  Marland Kitchen Maintain a healthy weight.  . Exercise daily per your doctor's orders. . Eat a balanced diet high in fruits, vegetables, bran, and fiber. Limit intake of red meat      and processed foods.  . Limit how much alcohol you consume, if at all. Ali Lowe regular bone mineral density testing for osteoporosis.  . Talk to your doctor about cardiovascular disease or "heart disease" screening. . Stop smoking (if you smoke). . Know your family history. . Be mindful of your emotional, social, and spiritual needs. . Meet regularly with a Primary Care Provider (PCP). Find a PCP if you do not             already have one. . Talk to your doctor  about regular cancer screening including screening for colon           cancer, GYN cancers, and skin cancer.

## 2015-11-07 ENCOUNTER — Ambulatory Visit (HOSPITAL_BASED_OUTPATIENT_CLINIC_OR_DEPARTMENT_OTHER): Payer: Medicare Other | Admitting: Nurse Practitioner

## 2015-11-07 ENCOUNTER — Encounter: Payer: Self-pay | Admitting: Nurse Practitioner

## 2015-11-07 ENCOUNTER — Telehealth: Payer: Self-pay | Admitting: Nurse Practitioner

## 2015-11-07 VITALS — BP 128/69 | HR 56 | Temp 97.5°F | Resp 17 | Ht 65.0 in | Wt 157.6 lb

## 2015-11-07 DIAGNOSIS — Z853 Personal history of malignant neoplasm of breast: Secondary | ICD-10-CM | POA: Diagnosis not present

## 2015-11-07 DIAGNOSIS — C50911 Malignant neoplasm of unspecified site of right female breast: Secondary | ICD-10-CM

## 2015-11-07 NOTE — Telephone Encounter (Signed)
appt made and avs printed °

## 2015-11-08 ENCOUNTER — Other Ambulatory Visit: Payer: Self-pay | Admitting: Nurse Practitioner

## 2015-11-08 DIAGNOSIS — Z1231 Encounter for screening mammogram for malignant neoplasm of breast: Secondary | ICD-10-CM

## 2015-11-20 ENCOUNTER — Other Ambulatory Visit (HOSPITAL_COMMUNITY): Payer: Medicare Other

## 2015-11-27 ENCOUNTER — Ambulatory Visit (HOSPITAL_COMMUNITY): Payer: Medicare Other | Attending: Cardiovascular Disease

## 2015-11-27 ENCOUNTER — Other Ambulatory Visit (HOSPITAL_COMMUNITY): Payer: Self-pay

## 2015-11-27 DIAGNOSIS — I359 Nonrheumatic aortic valve disorder, unspecified: Secondary | ICD-10-CM | POA: Diagnosis present

## 2015-11-27 DIAGNOSIS — E7800 Pure hypercholesterolemia, unspecified: Secondary | ICD-10-CM | POA: Insufficient documentation

## 2015-11-27 DIAGNOSIS — I517 Cardiomegaly: Secondary | ICD-10-CM | POA: Diagnosis not present

## 2015-11-27 DIAGNOSIS — I340 Nonrheumatic mitral (valve) insufficiency: Secondary | ICD-10-CM | POA: Diagnosis not present

## 2015-11-27 DIAGNOSIS — Z954 Presence of other heart-valve replacement: Secondary | ICD-10-CM | POA: Diagnosis not present

## 2015-11-27 DIAGNOSIS — Z953 Presence of xenogenic heart valve: Secondary | ICD-10-CM | POA: Diagnosis not present

## 2015-11-27 DIAGNOSIS — I071 Rheumatic tricuspid insufficiency: Secondary | ICD-10-CM | POA: Diagnosis not present

## 2015-11-27 DIAGNOSIS — Z952 Presence of prosthetic heart valve: Secondary | ICD-10-CM

## 2015-11-27 DIAGNOSIS — I351 Nonrheumatic aortic (valve) insufficiency: Secondary | ICD-10-CM | POA: Diagnosis not present

## 2015-11-29 ENCOUNTER — Telehealth: Payer: Self-pay | Admitting: Cardiovascular Disease

## 2015-11-29 DIAGNOSIS — I359 Nonrheumatic aortic valve disorder, unspecified: Secondary | ICD-10-CM

## 2015-11-29 DIAGNOSIS — Z952 Presence of prosthetic heart valve: Secondary | ICD-10-CM

## 2015-11-29 NOTE — Telephone Encounter (Signed)
I spoke with the pt and she lives in [place] through May and she will not be available in 6 months to see Dr Burt Knack. I have placed a recall in the system for her next appointment and echocardiogram to be performed in May 2018 when she returns.

## 2015-11-29 NOTE — Telephone Encounter (Signed)
Ok thx.

## 2015-11-29 NOTE — Telephone Encounter (Signed)
Left message on machine for pt to contact the office.   

## 2015-11-29 NOTE — Telephone Encounter (Signed)
New message    Pt stated that the dr wanted to see her in 6 months but she won't be available for 9 months. Pt wants to know if she should see the dr before she goes out of town. Please call.

## 2015-11-30 ENCOUNTER — Telehealth: Payer: Self-pay

## 2015-11-30 NOTE — Telephone Encounter (Signed)
Please advise in dr burns absence, thanks

## 2015-11-30 NOTE — Telephone Encounter (Signed)
She only needed a refill on the choloral hydrate a 3 month supply

## 2015-11-30 NOTE — Telephone Encounter (Signed)
What meds? OK 3 mo on all, except for Xanax Thx

## 2015-11-30 NOTE — Telephone Encounter (Signed)
Chloral Hydrate CRYS [[id number]]   Patient came in and is requesting a refill on this medication for a 65monthsupply.

## 2015-12-03 ENCOUNTER — Other Ambulatory Visit: Payer: Self-pay | Admitting: Emergency Medicine

## 2015-12-03 MED ORDER — CHLORAL HYDRATE CRYS: 10.0000 mL | CRYSTALS | Freq: Every evening | 5 refills | Status: DC | PRN

## 2015-12-03 NOTE — Telephone Encounter (Signed)
Spoke with pharmacist. [name] to fill liquid med for sleep. Has been done before.

## 2015-12-03 NOTE — Telephone Encounter (Signed)
Patient called again about this. Can you please follow up, THank you. Pharmacy number is [phone]

## 2015-12-03 NOTE — Telephone Encounter (Signed)
i'm not showing we have ever prescribed this for patient---not listed as prescribed by our office on current med list and can't find in med history---advised patient's husband to let patient know to contact provider that normally prescribes for her to place request for refill

## 2015-12-24 DIAGNOSIS — T07 Unspecified multiple injuries: Secondary | ICD-10-CM | POA: Diagnosis not present

## 2016-01-16 DIAGNOSIS — H2513 Age-related nuclear cataract, bilateral: Secondary | ICD-10-CM | POA: Diagnosis not present

## 2016-01-16 DIAGNOSIS — H5203 Hypermetropia, bilateral: Secondary | ICD-10-CM | POA: Diagnosis not present

## 2016-01-16 DIAGNOSIS — Z01419 Encounter for gynecological examination (general) (routine) without abnormal findings: Secondary | ICD-10-CM | POA: Diagnosis not present

## 2016-01-16 DIAGNOSIS — R319 Hematuria, unspecified: Secondary | ICD-10-CM | POA: Diagnosis not present

## 2016-01-16 DIAGNOSIS — H52203 Unspecified astigmatism, bilateral: Secondary | ICD-10-CM | POA: Diagnosis not present

## 2016-01-22 DIAGNOSIS — S99812A Other specified injuries of left ankle, initial encounter: Secondary | ICD-10-CM | POA: Diagnosis not present

## 2016-01-22 DIAGNOSIS — S93492A Sprain of other ligament of left ankle, initial encounter: Secondary | ICD-10-CM | POA: Diagnosis not present

## 2016-01-22 DIAGNOSIS — M25872 Other specified joint disorders, left ankle and foot: Secondary | ICD-10-CM | POA: Diagnosis not present

## 2016-01-22 DIAGNOSIS — M25572 Pain in left ankle and joints of left foot: Secondary | ICD-10-CM | POA: Diagnosis not present

## 2016-01-29 ENCOUNTER — Other Ambulatory Visit (INDEPENDENT_AMBULATORY_CARE_PROVIDER_SITE_OTHER): Payer: Medicare Other

## 2016-01-29 ENCOUNTER — Encounter: Payer: Self-pay | Admitting: Internal Medicine

## 2016-01-29 ENCOUNTER — Ambulatory Visit (INDEPENDENT_AMBULATORY_CARE_PROVIDER_SITE_OTHER): Payer: Medicare Other | Admitting: Internal Medicine

## 2016-01-29 ENCOUNTER — Encounter: Payer: Medicare Other | Admitting: Internal Medicine

## 2016-01-29 VITALS — BP 142/74 | HR 57 | Temp 97.6°F | Resp 16 | Ht 65.0 in | Wt 160.0 lb

## 2016-01-29 DIAGNOSIS — F5101 Primary insomnia: Secondary | ICD-10-CM | POA: Diagnosis not present

## 2016-01-29 DIAGNOSIS — R252 Cramp and spasm: Secondary | ICD-10-CM | POA: Diagnosis not present

## 2016-01-29 DIAGNOSIS — Z23 Encounter for immunization: Secondary | ICD-10-CM

## 2016-01-29 LAB — COMPREHENSIVE METABOLIC PANEL
ALBUMIN: 3.9 g/dL (ref 3.5–5.2)
ALT: 18 U/L (ref 0–35)
AST: 26 U/L (ref 0–37)
Alkaline Phosphatase: 71 U/L (ref 39–117)
BUN: 15 mg/dL (ref 6–23)
CHLORIDE: 99 meq/L (ref 96–112)
CO2: 30 meq/L (ref 19–32)
CREATININE: 0.71 mg/dL (ref 0.40–1.20)
Calcium: 8.9 mg/dL (ref 8.4–10.5)
GFR: 83.99 mL/min (ref >60.00)
GLUCOSE: 85 mg/dL (ref 70–99)
POTASSIUM: 4.4 meq/L (ref 3.5–5.1)
SODIUM: 135 meq/L (ref 135–145)
Total Bilirubin: 0.6 mg/dL (ref 0.2–1.2)
Total Protein: 6.4 g/dL (ref 6.0–8.3)

## 2016-01-29 LAB — MAGNESIUM: MAGNESIUM: 1.8 mg/dL (ref 1.5–2.5)

## 2016-01-29 NOTE — Assessment & Plan Note (Signed)
Discussed common causes Will check CMP, magnesium level, but discussed with her that I expect these to be normal Increase hydration Increase exercise when able Consider stretching before bed Can try over-the-counter supplements or leg cramping medication/herbal supplements

## 2016-01-29 NOTE — Progress Notes (Signed)
Pre visit review using our clinic review tool, if applicable. No additional management support is needed unless otherwise documented below in the visit note. 

## 2016-01-29 NOTE — Assessment & Plan Note (Signed)
Echo up to date and stable

## 2016-01-29 NOTE — Progress Notes (Addendum)
Subjective:    Patient ID: [name], female    DOB: 1932-10-21, 82 y.o.   MRN: [id number]  HPI The patient is here for follow up.  Leg cramps:  Yesterday she has severe leg cramps.  She has had some more recently and is unsure why. She was told to eat-year-old mustered, take potassium, increase her fluids and she is unsure what she needs to do. She drinks two ice coffees and ice tea during the day.  She does not drink as much water as she thinks she should. She sits a lot and has not been exercising recently since spraining her ankle three weeks ago.  She is taking magnesium daily. She was wondering if she should start taking over-the-counter potassium.  Insomnia:  She is taking her medication nightly.  It works well and she has been on it for years.  She denies side effects and does not want to change to anything else since this works well.    Medications and allergies reviewed with patient and updated if appropriate.  Patient Active Problem List   Diagnosis Date Noted  . Insomnia 09/12/2015  . Rectal bleed 09/12/2015  . S/P AVR 11/16/2014  . Severe calcific aortic stenosis 10/17/2014  . Branch retinal vein occlusion 10/12/2013  . Aortic valve stenosis, severe   . ALLERGIC RHINITIS DUE TO OTHER ALLERGEN 09/10/2009  . Borderline hyperlipidemia 11/30/2006  . HYPOGLYCEMIA NEC 11/20/2006  . MITRAL VALVE PROLAPSE 10/29/2006  . Osteopenia 10/29/2006  . Breast cancer, right breast ([place]) 10/29/2006    Current Outpatient Prescriptions on File Prior to Visit  Medication Sig Dispense Refill  . acetaminophen (TYLENOL) 500 MG tablet Take 500-1,000 mg by mouth at bedtime as needed for mild pain or moderate pain.    Marland Kitchen ALPRAZolam (XANAX) 0.5 MG tablet Take 1 tablet (0.5 mg total) by mouth at bedtime. May take additional dose as needed for stress or abdominal pain related to IBS. 120 tablet 0  . aspirin EC 81 MG tablet Take 1 tablet (81 mg total) by mouth daily.    . Calcium  Carb-Cholecalciferol (CALCIUM 600+D3) 600-400 MG-UNIT TABS Take 1 tablet by mouth daily.    . Chloral Hydrate CRYS Take 10 mLs by mouth at bedtime as needed (Call  liquid into [place]). 100 g 5  . Cholecalciferol (VITAMIN D-3) 1000 UNITS CAPS Take 1 capsule by mouth daily.     . clindamycin (CLEOCIN) 300 MG capsule Take 600 mg by mouth as directed. Take 600 mg (2 capsules) by mouth one hour prior to dental procedures    . Magnesium 400 MG CAPS Take 400 mg by mouth every morning.     Marland Kitchen Ophthalmic Irrigation Solution (OCUSOFT EYE [place] OP) Apply 1 application to eye 2 (two) times daily as needed (dry eyes). Morning and before bed    . Polyethyl Glycol-Propyl Glycol (SYSTANE ULTRA PF) 0.4-0.3 % SOLN Apply 1 drop to eye 2 (two) times daily.    . Red Yeast Rice Extract ([employer] RED YEAST RICE PO) Take 600 mg by mouth daily.     . Wheat Dextrin (BENEFIBER DRINK MIX PO) Take 5 mLs by mouth every morning. Mix with 1/2 a glass water, and takes with vitamins     No current facility-administered medications on file prior to visit.     Past Medical History:  Diagnosis Date  . Aortic valve stenosis, severe   . Breast cancer ([place])    right  . Hiatal hernia   .  Hypercholesteremia   . Hypoglycemia   . Osteopenia   . Ulcer ([facility]) 2012   bleeding gastric Ulcer    Past Surgical History:  Procedure Laterality Date  . AORTIC VALVE REPLACEMENT N/A 11/16/2014   Procedure: AORTIC VALVE REPLACEMENT (AVR);  Surgeon: Gaye Pollack, MD;  Location: [facility];  Service: Open Heart Surgery;  Laterality: N/A;  . BREAST SURGERY    . CARDIAC CATHETERIZATION N/A 10/18/2014   Procedure: Right/Left Heart Cath and Coronary Angiography;  Surgeon: [name], MD;  Location: [facility];  Service: Cardiovascular;  Laterality: N/A;  . COSMETIC SURGERY     face  . PARATHYROIDECTOMY    . TEE WITHOUT CARDIOVERSION N/A 11/16/2014   Procedure: TRANSESOPHAGEAL ECHOCARDIOGRAM (TEE);  Surgeon: Gaye Pollack,  MD;  Location: [facility];  Service: Open Heart Surgery;  Laterality: N/A;  . TONSILLECTOMY      Social History   Social History  . Marital status: Married    Spouse name: N/A  . Number of children: N/A  . Years of education: N/A   Social History Main Topics  . Smoking status: Former Smoker    Packs/day: 0.50    Years: 30.00    Types: Cigarettes  . Smokeless tobacco: Never Used     Comment: quit smoking 1980  . Alcohol use Not on file  . Drug use: No  . Sexual activity: Not on file   Other Topics Concern  . Not on file   Social History Narrative  . No narrative on file    Family History  Problem Relation Age of Onset  . Hypertension Mother   . Rectal cancer Mother   . Kidney disease Father   . Colon cancer Mother     Review of Systems  Constitutional: Negative for appetite change, chills, fatigue and fever.  Respiratory: Negative for cough, shortness of breath and wheezing.   Cardiovascular: Positive for palpitations (rare). Negative for chest pain.  Gastrointestinal: Positive for constipation (takes fiber daily). Negative for abdominal pain, blood in stool and diarrhea.       No gerd  Neurological: Positive for dizziness (intermittent, chronic). Negative for light-headedness and headaches.       Objective:   Vitals:   01/29/16 1038  BP: (!) 142/74  Pulse: (!) 57  Resp: 16  Temp: 97.6 F (36.4 C)   Filed Weights   01/29/16 1038  Weight: 160 lb (72.6 kg)   Body mass index is 26.63 kg/m.   Physical Exam    Constitutional: Appears well-developed and well-nourished. No distress.  HENT:  Head: Normocephalic and atraumatic.  Neck: Neck supple. No tracheal deviation present. No thyromegaly present.  No cervical lymphadenopathy Cardiovascular: Normal rate, regular rhythm and normal heart sounds.   2/6 systolic and 2/6 diastolic murmur heard. No carotid bruit .  No edema Pulmonary/Chest: Effort normal and breath sounds normal. No respiratory distress. No has  no wheezes. No rales.  Skin: Skin is warm and dry. Not diaphoretic.  Psychiatric: Normal mood and affect. Behavior is normal.      Assessment & Plan:   Flu vaccine today Blood work today  See Problem List for Assessment and Plan of chronic medical problems.

## 2016-01-29 NOTE — Assessment & Plan Note (Signed)
2017 Did see GI  Likely hemorrhoidal in nature

## 2016-01-29 NOTE — Patient Instructions (Addendum)
  Test(s) ordered today. Your results will be released to [facility] (or called to you) after review, usually within 72hours after test completion. If any changes need to be made, you will be notified at that same time.  All other Health Maintenance issues reviewed.   All recommended immunizations and age-appropriate screenings are up-to-date or discussed.  Flu vaccine administered today.   Medications reviewed and updated.  No changes recommended at this time.     Leg Cramps Leg cramps occur when a muscle or muscles tighten and you have no control over this tightening (involuntary muscle contraction). Muscle cramps can develop in any muscle, but the most common place is in the calf muscles of the leg. Those cramps can occur during exercise or when you are at rest. Leg cramps are painful, and they may last for a few seconds to a few minutes. Cramps may return several times before they finally stop. Usually, leg cramps are not caused by a serious medical problem. In many cases, the cause is not known. Some common causes include:  Overexertion.  Overuse from repetitive motions, or doing the same thing over and over.  Remaining in a certain position for a long period of time.  Improper preparation, form, or technique while performing a sport or an activity.  Dehydration.  Injury.  Side effects of some medicines.  Abnormally low levels of the salts and ions in your blood (electrolytes), especially potassium and calcium. These levels could be low if you are taking water pills (diuretics) or if you are pregnant. HOME CARE INSTRUCTIONS Watch your condition for any changes. Taking the following actions may help to lessen any discomfort that you are feeling:  Stay well-hydrated. Drink enough fluid to keep your urine clear or pale yellow.  Try massaging, stretching, and relaxing the affected muscle. Do this for several minutes at a time.  For tight or tense muscles, use a warm towel, heating  pad, or hot shower water directed to the affected area.  If you are sore or have pain after a cramp, applying ice to the affected area may relieve discomfort.  Put ice in a plastic bag.  Place a towel between your skin and the bag.  Leave the ice on for 20 minutes, 2-3 times per day.  Avoid strenuous exercise for several days if you have been having frequent leg cramps.  Make sure that your diet includes the essential minerals for your muscles to work normally.  Take medicines only as directed by your health care provider. SEEK MEDICAL CARE IF:  Your leg cramps get more severe or more frequent, or they do not improve over time.  Your foot becomes cold, numb, or blue.   This information is not intended to replace advice given to you by your health care provider. Make sure you discuss any questions you have with your health care provider.   Document Released: 05/22/2004 Document Revised: 08/29/2014 Document Reviewed: 03/22/2014 Elsevier Interactive Patient Education [employer].

## 2016-01-29 NOTE — Assessment & Plan Note (Signed)
Taking Chloral hydrate nightly - has been on it for years - never tried anything else No side effects, taking it appropriately Continue current medication at current dose

## 2016-02-04 DIAGNOSIS — L723 Sebaceous cyst: Secondary | ICD-10-CM | POA: Diagnosis not present

## 2016-02-04 DIAGNOSIS — D225 Melanocytic nevi of trunk: Secondary | ICD-10-CM | POA: Diagnosis not present

## 2016-02-04 DIAGNOSIS — L570 Actinic keratosis: Secondary | ICD-10-CM | POA: Diagnosis not present

## 2016-02-04 DIAGNOSIS — L821 Other seborrheic keratosis: Secondary | ICD-10-CM | POA: Diagnosis not present

## 2016-02-04 DIAGNOSIS — L814 Other melanin hyperpigmentation: Secondary | ICD-10-CM | POA: Diagnosis not present

## 2016-02-04 DIAGNOSIS — D485 Neoplasm of uncertain behavior of skin: Secondary | ICD-10-CM | POA: Diagnosis not present

## 2016-02-15 ENCOUNTER — Ambulatory Visit
Admission: RE | Admit: 2016-02-15 | Discharge: 2016-02-15 | Disposition: A | Discharge status: Home / Self Care | Payer: Medicare Other | Source: Ambulatory Visit | Attending: Nurse Practitioner | Admitting: Nurse Practitioner

## 2016-02-15 DIAGNOSIS — Z1231 Encounter for screening mammogram for malignant neoplasm of breast: Secondary | ICD-10-CM | POA: Diagnosis not present

## 2016-02-22 ENCOUNTER — Encounter: Payer: Medicare Other | Admitting: Internal Medicine

## 2016-02-26 DIAGNOSIS — R319 Hematuria, unspecified: Secondary | ICD-10-CM | POA: Diagnosis not present

## 2016-03-07 ENCOUNTER — Telehealth: Payer: Self-pay | Admitting: Internal Medicine

## 2016-03-07 MED ORDER — ALPRAZOLAM 0.5 MG PO TABS: 0.5000 mg | ORAL_TABLET | Freq: Every day | ORAL | 0 refills | Status: DC

## 2016-03-07 NOTE — Telephone Encounter (Signed)
Rx faxed

## 2016-03-13 DIAGNOSIS — C50411 Malignant neoplasm of upper-outer quadrant of right female breast: Secondary | ICD-10-CM | POA: Diagnosis not present

## 2016-03-13 DIAGNOSIS — Z170 Estrogen receptor positive status [ER+]: Secondary | ICD-10-CM | POA: Diagnosis not present

## 2016-04-02 DIAGNOSIS — B078 Other viral warts: Secondary | ICD-10-CM | POA: Diagnosis not present

## 2016-04-02 DIAGNOSIS — M2042 Other hammer toe(s) (acquired), left foot: Secondary | ICD-10-CM | POA: Diagnosis not present

## 2016-04-04 DIAGNOSIS — L853 Xerosis cutis: Secondary | ICD-10-CM | POA: Diagnosis not present

## 2016-04-04 DIAGNOSIS — L259 Unspecified contact dermatitis, unspecified cause: Secondary | ICD-10-CM | POA: Diagnosis not present

## 2016-04-09 ENCOUNTER — Telehealth: Payer: Self-pay | Admitting: Internal Medicine

## 2016-04-09 DIAGNOSIS — Z853 Personal history of malignant neoplasm of breast: Secondary | ICD-10-CM | POA: Diagnosis not present

## 2016-04-09 DIAGNOSIS — Q231 Congenital insufficiency of aortic valve: Secondary | ICD-10-CM | POA: Diagnosis not present

## 2016-04-09 DIAGNOSIS — J309 Allergic rhinitis, unspecified: Secondary | ICD-10-CM | POA: Diagnosis not present

## 2016-04-09 NOTE — Telephone Encounter (Signed)
LVM for pt to call back to discuss.

## 2016-04-09 NOTE — Telephone Encounter (Signed)
Please follow up with patient in regard to labs from October. I have mailed patient a copy.

## 2016-04-09 NOTE — Telephone Encounter (Signed)
Advised patient that results were normal from October labs

## 2016-04-16 DIAGNOSIS — M2042 Other hammer toe(s) (acquired), left foot: Secondary | ICD-10-CM | POA: Diagnosis not present

## 2016-04-16 DIAGNOSIS — L821 Other seborrheic keratosis: Secondary | ICD-10-CM | POA: Diagnosis not present

## 2016-04-16 DIAGNOSIS — L818 Other specified disorders of pigmentation: Secondary | ICD-10-CM | POA: Diagnosis not present

## 2016-04-16 DIAGNOSIS — L853 Xerosis cutis: Secondary | ICD-10-CM | POA: Diagnosis not present

## 2016-04-16 DIAGNOSIS — R208 Other disturbances of skin sensation: Secondary | ICD-10-CM | POA: Diagnosis not present

## 2016-04-16 DIAGNOSIS — L259 Unspecified contact dermatitis, unspecified cause: Secondary | ICD-10-CM | POA: Diagnosis not present

## 2016-04-23 NOTE — Telephone Encounter (Signed)
error 

## 2016-05-29 DIAGNOSIS — L853 Xerosis cutis: Secondary | ICD-10-CM | POA: Diagnosis not present

## 2016-05-29 DIAGNOSIS — R208 Other disturbances of skin sensation: Secondary | ICD-10-CM | POA: Diagnosis not present

## 2016-05-29 DIAGNOSIS — I781 Nevus, non-neoplastic: Secondary | ICD-10-CM | POA: Diagnosis not present

## 2016-05-29 DIAGNOSIS — L570 Actinic keratosis: Secondary | ICD-10-CM | POA: Diagnosis not present

## 2016-05-29 DIAGNOSIS — L814 Other melanin hyperpigmentation: Secondary | ICD-10-CM | POA: Diagnosis not present

## 2016-05-29 DIAGNOSIS — L298 Other pruritus: Secondary | ICD-10-CM | POA: Diagnosis not present

## 2016-05-29 DIAGNOSIS — D690 Allergic purpura: Secondary | ICD-10-CM | POA: Diagnosis not present

## 2016-05-29 DIAGNOSIS — L821 Other seborrheic keratosis: Secondary | ICD-10-CM | POA: Diagnosis not present

## 2016-06-04 DIAGNOSIS — R319 Hematuria, unspecified: Secondary | ICD-10-CM | POA: Diagnosis not present

## 2016-06-09 DIAGNOSIS — H35373 Puckering of macula, bilateral: Secondary | ICD-10-CM | POA: Diagnosis not present

## 2016-06-09 DIAGNOSIS — H43812 Vitreous degeneration, left eye: Secondary | ICD-10-CM | POA: Diagnosis not present

## 2016-06-09 DIAGNOSIS — H348312 Tributary (branch) retinal vein occlusion, right eye, stable: Secondary | ICD-10-CM | POA: Diagnosis not present

## 2016-06-17 ENCOUNTER — Telehealth: Payer: Self-pay | Admitting: Internal Medicine

## 2016-06-17 MED ORDER — ALPRAZOLAM 0.5 MG PO TABS: 0.5000 mg | ORAL_TABLET | Freq: Every day | ORAL | 0 refills | Status: DC

## 2016-06-17 NOTE — Telephone Encounter (Signed)
Rx sent 

## 2016-07-09 DIAGNOSIS — I350 Nonrheumatic aortic (valve) stenosis: Secondary | ICD-10-CM | POA: Diagnosis not present

## 2016-07-09 DIAGNOSIS — R9431 Abnormal electrocardiogram [ECG] [EKG]: Secondary | ICD-10-CM | POA: Diagnosis not present

## 2016-07-11 ENCOUNTER — Encounter: Payer: Self-pay | Admitting: Internal Medicine

## 2016-07-11 DIAGNOSIS — Z13820 Encounter for screening for osteoporosis: Secondary | ICD-10-CM

## 2016-07-11 DIAGNOSIS — E2839 Other primary ovarian failure: Secondary | ICD-10-CM

## 2016-07-11 DIAGNOSIS — M85869 Other specified disorders of bone density and structure, unspecified lower leg: Secondary | ICD-10-CM

## 2016-07-16 NOTE — Telephone Encounter (Signed)
dexa ordered - please help schedule and let her know we can give her a tetanus.

## 2016-07-31 DIAGNOSIS — S90212A Contusion of left great toe with damage to nail, initial encounter: Secondary | ICD-10-CM | POA: Diagnosis not present

## 2016-08-05 DIAGNOSIS — L308 Other specified dermatitis: Secondary | ICD-10-CM | POA: Diagnosis not present

## 2016-08-05 DIAGNOSIS — L853 Xerosis cutis: Secondary | ICD-10-CM | POA: Diagnosis not present

## 2016-08-08 DIAGNOSIS — H6123 Impacted cerumen, bilateral: Secondary | ICD-10-CM | POA: Diagnosis not present

## 2016-08-08 DIAGNOSIS — R05 Cough: Secondary | ICD-10-CM | POA: Diagnosis not present

## 2016-08-08 DIAGNOSIS — B370 Candidal stomatitis: Secondary | ICD-10-CM | POA: Diagnosis not present

## 2016-08-08 DIAGNOSIS — K1230 Oral mucositis (ulcerative), unspecified: Secondary | ICD-10-CM | POA: Diagnosis not present

## 2016-08-08 DIAGNOSIS — H905 Unspecified sensorineural hearing loss: Secondary | ICD-10-CM | POA: Diagnosis not present

## 2016-08-13 ENCOUNTER — Encounter: Payer: Self-pay | Admitting: Internal Medicine

## 2016-08-15 ENCOUNTER — Telehealth: Payer: Self-pay | Admitting: Internal Medicine

## 2016-08-15 NOTE — Telephone Encounter (Signed)
Pt would like refill of [place] on [address]

## 2016-08-18 MED ORDER — CHLORAL HYDRATE CRYS: 10.0000 mL | CRYSTALS | Freq: Every evening | 1 refills | Status: DC | PRN

## 2016-08-18 NOTE — Telephone Encounter (Signed)
Pls advise ok for refill../lmb 

## 2016-08-18 NOTE — Telephone Encounter (Signed)
Phoned in RX enough to get pt until appt in June. Pt is aware.

## 2016-08-27 DIAGNOSIS — K130 Diseases of lips: Secondary | ICD-10-CM | POA: Diagnosis not present

## 2016-08-28 DIAGNOSIS — C50411 Malignant neoplasm of upper-outer quadrant of right female breast: Secondary | ICD-10-CM | POA: Diagnosis not present

## 2016-08-28 DIAGNOSIS — Z170 Estrogen receptor positive status [ER+]: Secondary | ICD-10-CM | POA: Diagnosis not present

## 2016-09-02 DIAGNOSIS — K1230 Oral mucositis (ulcerative), unspecified: Secondary | ICD-10-CM | POA: Diagnosis not present

## 2016-09-02 DIAGNOSIS — R05 Cough: Secondary | ICD-10-CM | POA: Diagnosis not present

## 2016-09-08 ENCOUNTER — Ambulatory Visit (HOSPITAL_COMMUNITY): Payer: Medicare Other | Attending: Cardiology

## 2016-09-08 ENCOUNTER — Other Ambulatory Visit: Payer: Self-pay

## 2016-09-08 DIAGNOSIS — Z952 Presence of prosthetic heart valve: Secondary | ICD-10-CM | POA: Diagnosis not present

## 2016-09-08 DIAGNOSIS — Z953 Presence of xenogenic heart valve: Secondary | ICD-10-CM | POA: Diagnosis not present

## 2016-09-08 DIAGNOSIS — Z853 Personal history of malignant neoplasm of breast: Secondary | ICD-10-CM | POA: Insufficient documentation

## 2016-09-08 DIAGNOSIS — Z87891 Personal history of nicotine dependence: Secondary | ICD-10-CM | POA: Diagnosis not present

## 2016-09-08 DIAGNOSIS — E785 Hyperlipidemia, unspecified: Secondary | ICD-10-CM | POA: Diagnosis not present

## 2016-09-08 DIAGNOSIS — I082 Rheumatic disorders of both aortic and tricuspid valves: Secondary | ICD-10-CM | POA: Diagnosis not present

## 2016-09-08 DIAGNOSIS — I359 Nonrheumatic aortic valve disorder, unspecified: Secondary | ICD-10-CM | POA: Diagnosis present

## 2016-09-08 DIAGNOSIS — I371 Nonrheumatic pulmonary valve insufficiency: Secondary | ICD-10-CM | POA: Diagnosis not present

## 2016-09-09 ENCOUNTER — Ambulatory Visit (INDEPENDENT_AMBULATORY_CARE_PROVIDER_SITE_OTHER)
Admission: RE | Admit: 2016-09-09 | Discharge: 2016-09-09 | Disposition: A | Discharge status: Home / Self Care | Payer: Medicare Other | Source: Ambulatory Visit | Attending: Internal Medicine | Admitting: Internal Medicine

## 2016-09-09 DIAGNOSIS — M85869 Other specified disorders of bone density and structure, unspecified lower leg: Secondary | ICD-10-CM

## 2016-09-09 DIAGNOSIS — Z13820 Encounter for screening for osteoporosis: Secondary | ICD-10-CM

## 2016-09-09 DIAGNOSIS — E2839 Other primary ovarian failure: Secondary | ICD-10-CM

## 2016-09-15 ENCOUNTER — Encounter: Payer: Self-pay | Admitting: Cardiovascular Disease

## 2016-09-15 ENCOUNTER — Ambulatory Visit (INDEPENDENT_AMBULATORY_CARE_PROVIDER_SITE_OTHER): Payer: Medicare Other | Admitting: Cardiovascular Disease

## 2016-09-15 ENCOUNTER — Ambulatory Visit: Payer: Medicare Other | Admitting: Cardiovascular Disease

## 2016-09-15 VITALS — BP 100/70 | HR 60 | Ht 65.0 in | Wt 156.1 lb

## 2016-09-15 DIAGNOSIS — Z952 Presence of prosthetic heart valve: Secondary | ICD-10-CM | POA: Diagnosis not present

## 2016-09-15 DIAGNOSIS — R222 Localized swelling, mass and lump, trunk: Secondary | ICD-10-CM

## 2016-09-15 DIAGNOSIS — I359 Nonrheumatic aortic valve disorder, unspecified: Secondary | ICD-10-CM | POA: Diagnosis not present

## 2016-09-15 NOTE — Patient Instructions (Addendum)
Medication Instructions:  Your physician recommends that you continue on your current medications as directed. Please refer to the Current Medication list given to you today.  Labwork: No new orders.   Testing/Procedures: Your physician has requested that you have an echocardiogram in 1 YEAR. Echocardiography is a painless test that uses sound waves to create images of your heart. It provides your doctor with information about the size and shape of your heart and how well your heart's chambers and valves are working. This procedure takes approximately one hour. There are no restrictions for this procedure.  Non-Cardiac CT scanning, (CAT scanning), is a noninvasive, special x-ray that produces cross-sectional images of the body using x-rays and a computer. CT scans help physicians diagnose and treat medical conditions. For some CT exams, a contrast material is used to enhance visibility in the area of the body being studied. CT scans provide greater clarity and reveal more details than regular x-ray exams. (non-contrast CT Chest)  Follow-Up: Your physician wants you to follow-up in: 1 YEAR with Dr Burt Knack.  You will receive a reminder letter in the mail two months in advance. If you don't receive a letter, please call our office to schedule the follow-up appointment.   Any Other Special Instructions Will Be Listed Below (If Applicable).     If you need a refill on your cardiac medications before your next appointment, please call your pharmacy.

## 2016-09-15 NOTE — Progress Notes (Signed)
Cardiology Office Note Date:  09/19/2016   ID:  [name], DOB November 23, 1932, MRN [id number]  PCP:  [name], MD  Cardiologist:  [name], MD    Chief Complaint  Patient presents with  . Follow-up    History of Present Illness: [name] is a 83 y.o. female who presents for follow-up of aortic valve disease. She has previously been followed for severe aortic stenosis and she underwent surgical aortic valve replacement with a 21 mm magna ease valve in July 2016.  The patient walks about 30 minutes for exercise 3 or 4 days every week. She also plays tennis. She denies any exertional symptoms. She specifically denies chest pain, chest pressure, shortness of breath, edema, or heart palpitations. She continues to spend about half of the year in [place]. She's noticed a mass in her left parasternal area. This is nontender. She requests that this is checked today.  Past Medical History:  Diagnosis Date  . Aortic valve stenosis, severe   . Breast cancer ([facility])    right  . Hiatal hernia   . Hypercholesteremia   . Hypoglycemia   . Osteopenia   . Ulcer 2012   bleeding gastric Ulcer    Past Surgical History:  Procedure Laterality Date  . AORTIC VALVE REPLACEMENT N/A 11/16/2014   Procedure: AORTIC VALVE REPLACEMENT (AVR);  Surgeon: Gaye Pollack, MD;  Location: [facility];  Service: Open Heart Surgery;  Laterality: N/A;  . BREAST SURGERY    . CARDIAC CATHETERIZATION N/A 10/18/2014   Procedure: Right/Left Heart Cath and Coronary Angiography;  Surgeon: [name], MD;  Location: [facility];  Service: Cardiovascular;  Laterality: N/A;  . COSMETIC SURGERY     face  . PARATHYROIDECTOMY    . TEE WITHOUT CARDIOVERSION N/A 11/16/2014   Procedure: TRANSESOPHAGEAL ECHOCARDIOGRAM (TEE);  Surgeon: Gaye Pollack, MD;  Location: [facility];  Service: Open Heart Surgery;  Laterality: N/A;  . TONSILLECTOMY      Current Outpatient Prescriptions  Medication Sig Dispense  Refill  . Coenzyme Q10 (CO Q 10 PO) Take 1 capsule by mouth daily.    Marland Kitchen acetaminophen (TYLENOL) 500 MG tablet Take 500-1,000 mg by mouth at bedtime as needed for mild pain or moderate pain.    Marland Kitchen ALPRAZolam (XANAX) 0.5 MG tablet Take 1 tablet (0.5 mg total) by mouth at bedtime. May take additional dose as needed for stress or abdominal pain related to IBS. MUST HAVE OFFICE VISIT FOR FURTHER REFILLS 120 tablet 0  . aspirin EC 81 MG tablet Take 1 tablet (81 mg total) by mouth daily.    . Calcium Carb-Cholecalciferol (CALCIUM 600+D3) 600-400 MG-UNIT TABS Take 1 tablet by mouth daily.    . Chloral Hydrate CRYS Take 10 mLs by mouth at bedtime as needed (Call  liquid into [place]). 100 g 1  . Cholecalciferol (VITAMIN D-3) 1000 UNITS CAPS Take 1 capsule by mouth daily.     . clindamycin (CLEOCIN) 300 MG capsule Take 600 mg by mouth as directed. Take 600 mg (2 capsules) by mouth one hour prior to dental procedures    . Magnesium 400 MG CAPS Take 400 mg by mouth every morning.     Marland Kitchen Ophthalmic Irrigation Solution (OCUSOFT EYE [place] OP) Apply 1 application to eye 2 (two) times daily as needed (dry eyes). Morning and before bed    . Polyethyl Glycol-Propyl Glycol (SYSTANE ULTRA PF) 0.4-0.3 % SOLN Apply 1 drop to eye 2 (two)  times daily.    . Red Yeast Rice Extract (CVS RED YEAST RICE PO) Take 1,200 mg by mouth daily.     . Wheat Dextrin (BENEFIBER DRINK MIX PO) Take 5 mLs by mouth every morning. Mix with 1/2 a glass water, and takes with vitamins     No current facility-administered medications for this visit.     Allergies:   Amoxicillin-pot clavulanate; Penicillin g; Penicillins; Sulfa antibiotics; Sulfamethoxazole; and Sulfonamide derivatives   Social History:  The patient  reports that she has quit smoking. Her smoking use included Cigarettes. She has a 15.00 pack-year smoking history. She has never used smokeless tobacco. She reports that she does not use drugs.   Family History:   The patient's  family history includes Colon cancer in her mother; Hypertension in her mother; Kidney disease in her father; Rectal cancer in her mother.    ROS:  Please see the history of present illness.  Otherwise, review of systems is positive for Easy bruising, hearing loss, chest wall mass.  All other systems are reviewed and negative.    PHYSICAL EXAM: VS:  BP 100/70   Pulse 60   Ht '5\' 5"'$  (1.651 m)   Wt 156 lb 1.9 oz (70.8 kg)   BMI 25.98 kg/m  , BMI Body mass index is 25.98 kg/m. GEN: Well nourished, well developed, in no acute distress  HEENT: normal  Neck: no JVD, no masses. No carotid bruits Cardiac: RRR with grade 2/6 systolic murmur at the right upper sternal border and grade 1/6 diastolic decrescendo murmur at the left lower sternal border        Respiratory:  clear to auscultation bilaterally, normal work of breathing Chest: there is a superficial density that is palpable in the left anterior chest wall, mobile, nontender GI: soft, nontender, nondistended, + BS MS: no deformity or atrophy  Ext: no pretibial edema, pedal pulses 2+= bilaterally Skin: warm and dry, no rash Neuro:  Strength and sensation are intact Psych: euthymic mood, full affect  EKG:  EKG is ordered today. The ekg ordered today shows normal sinus rhythm 60 bpm, within normal limits.  Recent Labs: 01/29/2016: ALT 18; BUN 15; Creatinine, Ser 0.71; Magnesium 1.8; Potassium 4.4; Sodium 135   Lipid Panel     Component Value Date/Time   CHOL 262 (H) 02/21/2015 0913   TRIG 80.0 02/21/2015 0913   HDL 79.40 02/21/2015 0913   CHOLHDL 3 02/21/2015 0913   VLDL 16.0 02/21/2015 0913   LDLCALC 166 (H) 02/21/2015 0913   LDLDIRECT 144.0 11/29/2012 1601      Wt Readings from Last 3 Encounters:  09/15/16 156 lb 1.9 oz (70.8 kg)  01/29/16 160 lb (72.6 kg)  11/07/15 157 lb 9.6 oz (71.5 kg)     Cardiac Studies Reviewed: Echo: Study Conclusions  - Left ventricle: The cavity size was normal. There was  mild   concentric hypertrophy. Systolic function was vigorous. The   estimated ejection fraction was in the range of 65% to 70%. Wall   motion was normal; there were no regional wall motion   abnormalities. Left ventricular diastolic function parameters   were normal. - Aortic valve: There was mild regurgitation. Mean gradient (S): 15   mm Hg. Peak gradient (S): 30 mm Hg. - Aortic root: The aortic root was normal in size. - Mitral valve: There was no regurgitation. - Left atrium: The atrium was severely dilated. - Right ventricle: Systolic function was normal. - Right atrium: The atrium was mildly dilated. -  Tricuspid valve: There was moderate regurgitation. - Pulmonic valve: There was mild regurgitation. - Pulmonary arteries: Systolic pressure was mildly increased. PA   peak pressure: 39 mm Hg (S). - Inferior vena cava: The vessel was normal in size. - Pericardium, extracardiac: There was no pericardial effusion.  Impressions:  - A bioprosthetic valve sits well in the aortic position. There was   mild central aortic insufficiency and no paravalvular leak.   Transaortic gradients are normal.   There is no significant change from the prior study on 11/27/2015.   ASSESSMENT AND PLAN: 1.  Aortic valve disease status post bioprosthetic aortic valve replacement: The patient's echo images are reviewed. Her study is compared to last year's study and there is no significant change. The mean gradient is 15 mmHg and there is mild regurgitation noted. She continues with SBE prophylaxis. I will see her back in one year with a repeat echo study at that time.  2. Chest wall mass: This appears superficial, may be in the subcutaneous tissue. Will check a chest CT and if any significant abnormality will touch base with her primary care physician.  3. Bradycardia: The patient's beta blocker was discontinued last year and this is improved now. Asymptomatic.  Current medicines are reviewed with the  patient today.  The patient does not have concerns regarding medicines.  Labs/ tests ordered today include:   Orders Placed This Encounter  Procedures  . CT Chest Wo Contrast  . EKG 12-Lead  . ECHOCARDIOGRAM COMPLETE    Disposition:   FU one year  Signed, [name], MD  09/19/2016 11:10 AM    [facility] [address], [place], [place]  [address] Phone: [phone]; Fax: [phone]

## 2016-09-16 ENCOUNTER — Telehealth: Payer: Self-pay | Admitting: Internal Medicine

## 2016-09-16 NOTE — Telephone Encounter (Signed)
Patient requesting call back before 10am if possible in regard to Dexa results.

## 2016-09-16 NOTE — Telephone Encounter (Signed)
Spoke with pt, she has an appt on 09/29/16 with Dr Quay Burow for FU.

## 2016-09-25 ENCOUNTER — Ambulatory Visit (INDEPENDENT_AMBULATORY_CARE_PROVIDER_SITE_OTHER)
Admission: RE | Admit: 2016-09-25 | Discharge: 2016-09-25 | Disposition: A | Discharge status: Home / Self Care | Payer: Medicare Other | Source: Ambulatory Visit | Attending: Cardiovascular Disease | Admitting: Cardiovascular Disease

## 2016-09-25 DIAGNOSIS — R911 Solitary pulmonary nodule: Secondary | ICD-10-CM | POA: Diagnosis not present

## 2016-09-25 DIAGNOSIS — R222 Localized swelling, mass and lump, trunk: Secondary | ICD-10-CM

## 2016-09-26 ENCOUNTER — Telehealth: Payer: Self-pay | Admitting: Cardiovascular Disease

## 2016-09-26 NOTE — Telephone Encounter (Signed)
-----   Message from [name], MD sent at 09/25/2016  1:08 PM EDT ----- Scan reviewed and demonstrates a lipoma which corresponds to the palpable mass in the left chest wall. Recommend a 6 month follow-up CAT scan to reevaluate pulmonary nodule.

## 2016-09-26 NOTE — Telephone Encounter (Signed)
Patient calling back for her CT results.  Patient made aware of results.  Patient verbalizes understanding. Patient notified that she will need a repeat CT in December. Patient states that she will be in [place] from October through May of next year. Patient wanting to know if she can have the CT done before she goes, when she gets back, or while she is down there. Made patient aware that I would send Dr. Dr. Antionette Char nurse a message and that she would call her next week to let her know. Patient verbalized understanding and appreciated the call.

## 2016-09-26 NOTE — Telephone Encounter (Signed)
°  New Message   pt verbalized that she is returning call for rn  For her CT scan

## 2016-09-28 NOTE — Progress Notes (Signed)
Subjective:    Patient ID: [name], female    DOB: 23-Nov-1932, 83 y.o.   MRN: [id number]  HPI The patient is here for follow up.  Hyperlipidemia:  She has never been on medication.  She is taking red yeast rice daily - she increased the dose.  She would like to recheck her cholesterol in a few months to see how it is.   Osteopenia, high FRAX:  She plays tennis and walks 4 days a week.  She is taking the calcium and vitamin D daily.  She is not interested in taking any medication for her bones.  History of breast cancer:  She follows with oncology in [place].   Insomnia:  She takes chloral hydrate nightly.  She has been on it for years and denies side effects.  She has never tried anything else.    RUL nodules:  She has a lipoma on her chest and Dr Burt Knack ordered a ct chest.  The lump is a lipoma, but she has an incidental RUL nodule.  She has had CT scans in the past and they have shown other nodules.  She wonders about follow up - it was recommended to repeat a ct scan in 6-12 months.   Medications and allergies reviewed with patient and updated if appropriate.  Patient Active Problem List   Diagnosis Date Noted  . Hyperglycemia 09/29/2016  . Leg cramps 01/29/2016  . Insomnia 09/12/2015  . Rectal bleed 09/12/2015  . S/P AVR 11/16/2014  . Branch retinal vein occlusion 10/12/2013  . Aortic valve stenosis, severe   . ALLERGIC RHINITIS DUE TO OTHER ALLERGEN 09/10/2009  . Hyperlipidemia 11/30/2006  . HYPOGLYCEMIA NEC 11/20/2006  . MITRAL VALVE PROLAPSE 10/29/2006  . Osteopenia 10/29/2006  . History of right breast cancer 10/29/2006    Current Outpatient Prescriptions on File Prior to Visit  Medication Sig Dispense Refill  . acetaminophen (TYLENOL) 500 MG tablet Take 500-1,000 mg by mouth at bedtime as needed for mild pain or moderate pain.    Marland Kitchen ALPRAZolam (XANAX) 0.5 MG tablet Take 1 tablet (0.5 mg total) by mouth at bedtime. May take additional dose as needed for  stress or abdominal pain related to IBS. MUST HAVE OFFICE VISIT FOR FURTHER REFILLS 120 tablet 0  . aspirin EC 81 MG tablet Take 1 tablet (81 mg total) by mouth daily.    . Calcium Carb-Cholecalciferol (CALCIUM 600+D3) 600-400 MG-UNIT TABS Take 1 tablet by mouth daily.    . Chloral Hydrate CRYS Take 10 mLs by mouth at bedtime as needed (Call  liquid into [facility]). 100 g 1  . Cholecalciferol (VITAMIN D-3) 1000 UNITS CAPS Take 1 capsule by mouth daily.     . clindamycin (CLEOCIN) 300 MG capsule Take 600 mg by mouth as directed. Take 600 mg (2 capsules) by mouth one hour prior to dental procedures    . Coenzyme Q10 (CO Q 10 PO) Take 1 capsule by mouth daily.    . Magnesium 400 MG CAPS Take 400 mg by mouth every morning.     Marland Kitchen Ophthalmic Irrigation Solution (OCUSOFT EYE [place] OP) Apply 1 application to eye 2 (two) times daily as needed (dry eyes). Morning and before bed    . Polyethyl Glycol-Propyl Glycol (SYSTANE ULTRA PF) 0.4-0.3 % SOLN Apply 1 drop to eye 2 (two) times daily.    . Red Yeast Rice Extract (CVS RED YEAST RICE PO) Take 1,200 mg by mouth daily.     Marland Kitchen  Wheat Dextrin (BENEFIBER DRINK MIX PO) Take 5 mLs by mouth every morning. Mix with 1/2 a glass water, and takes with vitamins     No current facility-administered medications on file prior to visit.     Past Medical History:  Diagnosis Date  . Aortic valve stenosis, severe   . Breast cancer ([place])    right  . Hiatal hernia   . Hypercholesteremia   . Hypoglycemia   . Osteopenia   . Ulcer 2012   bleeding gastric Ulcer    Past Surgical History:  Procedure Laterality Date  . AORTIC VALVE REPLACEMENT N/A 11/16/2014   Procedure: AORTIC VALVE REPLACEMENT (AVR);  Surgeon: Gaye Pollack, MD;  Location: [facility];  Service: Open Heart Surgery;  Laterality: N/A;  . BREAST SURGERY    . CARDIAC CATHETERIZATION N/A 10/18/2014   Procedure: Right/Left Heart Cath and Coronary Angiography;  Surgeon: [name], MD;  Location:  [facility];  Service: Cardiovascular;  Laterality: N/A;  . COSMETIC SURGERY     face  . PARATHYROIDECTOMY    . TEE WITHOUT CARDIOVERSION N/A 11/16/2014   Procedure: TRANSESOPHAGEAL ECHOCARDIOGRAM (TEE);  Surgeon: Gaye Pollack, MD;  Location: [facility];  Service: Open Heart Surgery;  Laterality: N/A;  . TONSILLECTOMY      Social History   Social History  . Marital status: Married    Spouse name: N/A  . Number of children: N/A  . Years of education: N/A   Social History Main Topics  . Smoking status: Former Smoker    Packs/day: 0.50    Years: 30.00    Types: Cigarettes  . Smokeless tobacco: Never Used     Comment: quit smoking 1980  . Alcohol use Not on file  . Drug use: No  . Sexual activity: Not on file   Other Topics Concern  . Not on file   Social History Narrative  . No narrative on file    Family History  Problem Relation Age of Onset  . Hypertension Mother   . Rectal cancer Mother   . Colon cancer Mother   . Kidney disease Father     Review of Systems  Constitutional: Negative for chills and fever.  Respiratory: Negative for cough, shortness of breath and wheezing.   Cardiovascular: Negative for chest pain, palpitations and leg swelling.  Neurological: Negative for light-headedness and headaches.       Objective:   Vitals:   09/29/16 1013  BP: 132/80  Pulse: (!) 57  Resp: 16  Temp: 97.6 F (36.4 C)   Wt Readings from Last 3 Encounters:  09/29/16 157 lb (71.2 kg)  09/15/16 156 lb 1.9 oz (70.8 kg)  01/29/16 160 lb (72.6 kg)   Body mass index is 26.13 kg/m.   Physical Exam    Constitutional: Appears well-developed and well-nourished. No distress.  HENT:  Head: Normocephalic and atraumatic.  Neck: Neck supple. No tracheal deviation present. No thyromegaly present.  No cervical lymphadenopathy Cardiovascular: Normal rate, regular rhythm and normal heart sounds.   No murmur heard. No carotid bruit .  No edema Pulmonary/Chest: Effort  normal and breath sounds normal. No respiratory distress. No has no wheezes. No rales.  Skin: Skin is warm and dry. Not diaphoretic.  Psychiatric: Normal mood and affect. Behavior is normal.      Assessment & Plan:    See Problem List for Assessment and Plan of chronic medical problems.

## 2016-09-29 ENCOUNTER — Ambulatory Visit (INDEPENDENT_AMBULATORY_CARE_PROVIDER_SITE_OTHER): Payer: Medicare Other | Admitting: Internal Medicine

## 2016-09-29 ENCOUNTER — Encounter: Payer: Self-pay | Admitting: Internal Medicine

## 2016-09-29 VITALS — BP 132/80 | HR 57 | Temp 97.6°F | Resp 16 | Ht 65.0 in | Wt 157.0 lb

## 2016-09-29 DIAGNOSIS — F5101 Primary insomnia: Secondary | ICD-10-CM | POA: Diagnosis not present

## 2016-09-29 DIAGNOSIS — M85869 Other specified disorders of bone density and structure, unspecified lower leg: Secondary | ICD-10-CM

## 2016-09-29 DIAGNOSIS — Z23 Encounter for immunization: Secondary | ICD-10-CM | POA: Diagnosis not present

## 2016-09-29 DIAGNOSIS — R739 Hyperglycemia, unspecified: Secondary | ICD-10-CM

## 2016-09-29 DIAGNOSIS — E7800 Pure hypercholesterolemia, unspecified: Secondary | ICD-10-CM | POA: Diagnosis not present

## 2016-09-29 DIAGNOSIS — R7303 Prediabetes: Secondary | ICD-10-CM | POA: Insufficient documentation

## 2016-09-29 DIAGNOSIS — R911 Solitary pulmonary nodule: Secondary | ICD-10-CM | POA: Insufficient documentation

## 2016-09-29 DIAGNOSIS — IMO0001 Reserved for inherently not codable concepts without codable children: Secondary | ICD-10-CM

## 2016-09-29 DIAGNOSIS — C3491 Malignant neoplasm of unspecified part of right bronchus or lung: Secondary | ICD-10-CM | POA: Insufficient documentation

## 2016-09-29 DIAGNOSIS — C3411 Malignant neoplasm of upper lobe, right bronchus or lung: Secondary | ICD-10-CM | POA: Insufficient documentation

## 2016-09-29 NOTE — Assessment & Plan Note (Signed)
a1c ordered

## 2016-09-29 NOTE — Assessment & Plan Note (Signed)
Has osteopenia with high FRAX - discussed considering treatment She is taking calcium and vitamin d and is exercising She is not interested in taking medication at this time Will monitor closely - dexa every 2 years

## 2016-09-29 NOTE — Assessment & Plan Note (Signed)
Discussed f/u - I will order a ct scan in October 2018 - prior to her going back to [place] for the winter

## 2016-09-29 NOTE — Patient Instructions (Addendum)
Have blood work in October.   Test(s) ordered today. Your results will be released to [facility] (or called to you) after review, usually within 72hours after test completion. If any changes need to be made, you will be notified at that same time.   Tetanus immunization administered today.   Medications reviewed and updated.  No changes recommended at this time.  A ct scan of your lung will be ordered in October - we will call you to schedule this.

## 2016-09-29 NOTE — Assessment & Plan Note (Signed)
Controlled on current medication No side effects, has been on it for years Will continue

## 2016-09-29 NOTE — Assessment & Plan Note (Addendum)
Not on a statin - does not want to take it Taking 1200 mg red yeast rice Exercises regualrly Recheck cholesterol in October 2018 - wants to give the higher dose of red yeast rice time to work

## 2016-09-29 NOTE — Telephone Encounter (Signed)
New Message     Pt would like to discuss CT results with Lauren only!

## 2016-09-30 NOTE — Telephone Encounter (Signed)
Left message on machine for pt to contact the office.   

## 2016-09-30 NOTE — Telephone Encounter (Signed)
I spoke with the pt and made her aware of CT results.  The pt saw Dr Quay Burow yesterday and Dr Quay Burow is going to order a follow-up CT scan prior to the pt leaving for [place] in October.

## 2016-09-30 NOTE — Telephone Encounter (Signed)
Follow up    Pt is calling for Lauren.

## 2016-09-30 NOTE — Telephone Encounter (Signed)
Patient returning your call, thanks.

## 2016-10-03 ENCOUNTER — Other Ambulatory Visit (INDEPENDENT_AMBULATORY_CARE_PROVIDER_SITE_OTHER): Payer: Medicare Other

## 2016-10-03 DIAGNOSIS — E7800 Pure hypercholesterolemia, unspecified: Secondary | ICD-10-CM | POA: Diagnosis not present

## 2016-10-03 DIAGNOSIS — Z23 Encounter for immunization: Secondary | ICD-10-CM

## 2016-10-03 DIAGNOSIS — R739 Hyperglycemia, unspecified: Secondary | ICD-10-CM | POA: Diagnosis not present

## 2016-10-03 DIAGNOSIS — F5101 Primary insomnia: Secondary | ICD-10-CM

## 2016-10-03 LAB — COMPREHENSIVE METABOLIC PANEL
ALBUMIN: 4.1 g/dL (ref 3.5–5.2)
ALT: 19 U/L (ref 0–35)
AST: 21 U/L (ref 0–37)
Alkaline Phosphatase: 63 U/L (ref 39–117)
BUN: 10 mg/dL (ref 6–23)
CHLORIDE: 104 meq/L (ref 96–112)
CO2: 28 meq/L (ref 19–32)
Calcium: 9.2 mg/dL (ref 8.4–10.5)
Creatinine, Ser: 0.74 mg/dL (ref 0.40–1.20)
GFR: 79.93 mL/min (ref >60.00)
Glucose, Bld: 79 mg/dL (ref 70–99)
POTASSIUM: 4 meq/L (ref 3.5–5.1)
SODIUM: 139 meq/L (ref 135–145)
Total Bilirubin: 0.6 mg/dL (ref 0.2–1.2)
Total Protein: 6.1 g/dL (ref 6.0–8.3)

## 2016-10-03 LAB — CBC WITH DIFFERENTIAL/PLATELET
BASOS ABS: 0.1 10*3/uL (ref 0.0–0.1)
BASOS PCT: 0.8 % (ref 0.0–3.0)
EOS ABS: 0.1 10*3/uL (ref 0.0–0.7)
Eosinophils Relative: 1.2 % (ref 0.0–5.0)
HEMATOCRIT: 40.9 % (ref 36.0–46.0)
Hemoglobin: 13.9 g/dL (ref 12.0–15.0)
LYMPHS ABS: 3.1 10*3/uL (ref 0.7–4.0)
Lymphocytes Relative: 50.1 % — ABNORMAL HIGH (ref 12.0–46.0)
MCHC: 33.9 g/dL (ref 30.0–36.0)
MCV: 95 fl (ref 78.0–100.0)
MONO ABS: 0.5 10*3/uL (ref 0.1–1.0)
Monocytes Relative: 8.2 % (ref 3.0–12.0)
NEUTROS ABS: 2.4 10*3/uL (ref 1.4–7.7)
NEUTROS PCT: 39.7 % — AB (ref 43.0–77.0)
PLATELETS: 164 10*3/uL (ref 150.0–400.0)
RBC: 4.31 Mil/uL (ref 3.87–5.11)
RDW: 13.1 % (ref 11.5–15.5)
WBC: 6.1 10*3/uL (ref 4.0–10.5)

## 2016-10-03 LAB — LIPID PANEL
CHOL/HDL RATIO: 3
CHOLESTEROL: 245 mg/dL — AB (ref 0–200)
HDL: 79.2 mg/dL (ref >39.00)
LDL CALC: 147 mg/dL — AB (ref 0–99)
NonHDL: 165.96
TRIGLYCERIDES: 96 mg/dL (ref 0.0–149.0)
VLDL: 19.2 mg/dL (ref 0.0–40.0)

## 2016-10-03 LAB — TSH: TSH: 1.21 u[IU]/mL (ref 0.35–4.50)

## 2016-10-03 LAB — HEMOGLOBIN A1C: HEMOGLOBIN A1C: 5.8 % (ref 4.6–6.5)

## 2016-10-04 ENCOUNTER — Encounter: Payer: Self-pay | Admitting: Internal Medicine

## 2016-10-09 ENCOUNTER — Encounter: Payer: Self-pay | Admitting: Internal Medicine

## 2016-10-09 DIAGNOSIS — E7800 Pure hypercholesterolemia, unspecified: Secondary | ICD-10-CM

## 2016-10-19 ENCOUNTER — Telehealth: Payer: Self-pay | Admitting: Adult Health

## 2016-10-19 NOTE — Telephone Encounter (Signed)
S/w pt, advised 7/11 appt moved to 7/13 @ 1pm due to provider pal.

## 2016-10-24 ENCOUNTER — Ambulatory Visit (INDEPENDENT_AMBULATORY_CARE_PROVIDER_SITE_OTHER): Payer: Medicare Other | Admitting: Internal Medicine

## 2016-10-24 ENCOUNTER — Encounter: Payer: Self-pay | Admitting: Internal Medicine

## 2016-10-24 VITALS — BP 112/68 | HR 64 | Ht 64.25 in | Wt 156.1 lb

## 2016-10-24 DIAGNOSIS — K5909 Other constipation: Secondary | ICD-10-CM

## 2016-10-24 DIAGNOSIS — K589 Irritable bowel syndrome without diarrhea: Secondary | ICD-10-CM | POA: Diagnosis not present

## 2016-10-24 MED ORDER — ALPRAZOLAM 0.5 MG PO TABS: 0.5000 mg | ORAL_TABLET | Freq: Every day | ORAL | 5 refills | Status: DC

## 2016-10-24 NOTE — Patient Instructions (Signed)
We have sent the following medications to your pharmacy for you to pick up at your convenience: Xanax  Please purchase the following medications over the counter and take as directed: Benefiber 2 teaspoons daily  Please follow up with Dr Hilarie Fredrickson in 1 year.  If you are age 81 or older, your body mass index should be between 23-30. Your Body mass index is 26.59 kg/m. If this is out of the aforementioned range listed, please consider follow up with your Primary Care Provider.  If you are age 96 or younger, your body mass index should be between 19-25. Your Body mass index is 26.59 kg/m. If this is out of the aformentioned range listed, please consider follow up with your Primary Care Provider.

## 2016-10-24 NOTE — Progress Notes (Signed)
Subjective:    Patient ID: [name], female    DOB: 1932/05/24, 83 y.o.   MRN: [id number]  HPI Jaylei Fuerte is an 83 year old female with history of IBS, peptic ulcer disease, aortic stenosis with bicuspid aortic valve status post aortic valve replacement, history of remote breast cancer who is here for follow-up. She reports she's been doing very well. She is using Benefiber 2 teaspoons daily which has been "wonderful" for her bowel habits. She's having a bowel movement daily which pass easily without diarrhea or constipation. She denies blood in her stool or melena. She is eating well with good appetite. No dysphagia, nausea, vomiting. No abdominal pain though at times she does feel crampy abdominal discomfort worse with stress. She uses Xanax for this as she has long-term which helps tremendously.  She has had some cramping in her hands and feet and she is using CoQ10, magnesium 400 mg daily  She will be selling her home in [place] and living with her husband to [place] sometime in the next year.  Review of Systems As per history of present illness, otherwise negative  Current Medications, Allergies, Past Medical History, Past Surgical History, Family History and Social History were reviewed in [employer] record.     Objective:   Physical Exam BP 112/68 (BP Location: Left Arm, Patient Position: Sitting, Cuff Size: Normal)   Pulse 64   Ht 5' 4.25" (1.632 m)   Wt 156 lb 2 oz (70.8 kg)   BMI 26.59 kg/m  Constitutional: Well-developed and well-nourished. No distress. HEENT: Normocephalic and atraumatic.  Conjunctivae are normal.  No scleral icterus. Neck: Neck supple. Trachea midline. Cardiovascular: Normal rate, regular rhythm and intact distal pulses. Pulmonary/chest: Effort normal and breath sounds normal. No wheezing, rales or rhonchi. Abdominal: Soft, nontender, nondistended. Bowel sounds active throughout. Extremities: no clubbing,  cyanosis, or edema Neurological: Alert and oriented to person place and time. Skin: Skin is warm and dry. Psychiatric: Normal mood and affect. Behavior is normal.  CBC    Component Value Date/Time   WBC 6.1 10/03/2016 0840   RBC 4.31 10/03/2016 0840   HGB 13.9 10/03/2016 0840   HGB 13.5 10/31/2014 1430   HCT 40.9 10/03/2016 0840   HCT 40.7 10/31/2014 1430   PLT 164.0 10/03/2016 0840   PLT 190 10/31/2014 1430   MCV 95.0 10/03/2016 0840   MCV 95.4 10/31/2014 1430   MCH 31.6 12/15/2014 1651   MCHC 33.9 10/03/2016 0840   RDW 13.1 10/03/2016 0840   RDW 13.2 10/31/2014 1430   LYMPHSABS 3.1 10/03/2016 0840   LYMPHSABS 2.2 10/31/2014 1430   MONOABS 0.5 10/03/2016 0840   MONOABS 0.6 10/31/2014 1430   EOSABS 0.1 10/03/2016 0840   EOSABS 0.1 10/31/2014 1430   BASOSABS 0.1 10/03/2016 0840   BASOSABS 0.0 10/31/2014 1430   CMP     Component Value Date/Time   NA 139 10/03/2016 0840   NA 135 (L) 10/31/2014 1430   K 4.0 10/03/2016 0840   K 4.3 10/31/2014 1430   CL 104 10/03/2016 0840   CO2 28 10/03/2016 0840   CO2 24 10/31/2014 1430   GLUCOSE 79 10/03/2016 0840   GLUCOSE 95 10/31/2014 1430   BUN 10 10/03/2016 0840   BUN 15.0 10/31/2014 1430   CREATININE 0.74 10/03/2016 0840   CREATININE 0.77 12/15/2014 1651   CREATININE 0.8 10/31/2014 1430   CALCIUM 9.2 10/03/2016 0840   CALCIUM 9.0 10/31/2014 1430   PROT 6.1 10/03/2016 0840   PROT  6.3 (L) 10/31/2014 1430   ALBUMIN 4.1 10/03/2016 0840   ALBUMIN 3.8 10/31/2014 1430   AST 21 10/03/2016 0840   AST 25 10/31/2014 1430   ALT 19 10/03/2016 0840   ALT 20 10/31/2014 1430   ALKPHOS 63 10/03/2016 0840   ALKPHOS 63 10/31/2014 1430   BILITOT 0.6 10/03/2016 0840   BILITOT 0.66 10/31/2014 1430   GFRNONAA >60 11/19/2014 0922   GFRAA >60 11/19/2014 0922      Assessment & Plan:   83 year old female with history of IBS, peptic ulcer disease, aortic stenosis with bicuspid aortic valve status post aortic valve replacement, history of  remote breast cancer who is here for follow-up.   1. IBS -- long-standing and predictable driven by stress and anxiety. Continue Xanax 0.5 mg twice a day when necessary.  2. Constipation -- in very good control Benefiber. Continue Benefiber daily  Annual follow-up, sooner if needed 15 minutes spent with the patient today. Greater than 50% was spent in counseling and coordination of care with the patient

## 2016-10-28 ENCOUNTER — Telehealth: Payer: Self-pay | Admitting: Emergency Medicine

## 2016-10-28 NOTE — Telephone Encounter (Signed)
[facility]. They stated that pt is not due for liquid sleep med until 11/17/16.

## 2016-11-04 ENCOUNTER — Telehealth: Payer: Self-pay | Admitting: Cardiovascular Disease

## 2016-11-04 NOTE — Telephone Encounter (Signed)
New message   Pt calling for sleep study conclusions

## 2016-11-04 NOTE — Telephone Encounter (Signed)
Left message on machine for pt to contact the office.   

## 2016-11-05 ENCOUNTER — Encounter: Payer: Medicare Other | Admitting: Adult Health

## 2016-11-06 NOTE — Telephone Encounter (Signed)
The pt called the office to discuss the findings on her echocardiogram. The pt reviewed her report and was concerned in regards to comments about atrium being severely dilated.  I advised the pt on what this finding means and answered her questions. The pt will contact the office with any additional questions or concerns.

## 2016-11-07 ENCOUNTER — Encounter: Payer: Self-pay | Admitting: Adult Health

## 2016-11-07 ENCOUNTER — Ambulatory Visit (HOSPITAL_BASED_OUTPATIENT_CLINIC_OR_DEPARTMENT_OTHER): Payer: Medicare Other | Admitting: Adult Health

## 2016-11-07 VITALS — BP 155/56 | HR 58 | Temp 97.8°F | Resp 17 | Ht 64.25 in | Wt 157.4 lb

## 2016-11-07 DIAGNOSIS — Z853 Personal history of malignant neoplasm of breast: Secondary | ICD-10-CM | POA: Diagnosis not present

## 2016-11-07 DIAGNOSIS — M8580 Other specified disorders of bone density and structure, unspecified site: Secondary | ICD-10-CM | POA: Diagnosis not present

## 2016-11-07 DIAGNOSIS — N649 Disorder of breast, unspecified: Secondary | ICD-10-CM | POA: Diagnosis present

## 2016-11-07 DIAGNOSIS — Z1239 Encounter for other screening for malignant neoplasm of breast: Secondary | ICD-10-CM

## 2016-11-07 NOTE — Pre-Procedure Instructions (Signed)
Nipple lesion [id number]

## 2016-11-07 NOTE — Progress Notes (Signed)
CLINIC:  Survivorship   REASON FOR VISIT:  Routine follow-up for history of breast cancer.   BRIEF ONCOLOGIC HISTORY:    History of right breast cancer   02/16/2004 Initial Diagnosis    Right breast mass biopsy: Invasive mammary cancer, MRI revealed 1.3 cm mass upper inner quadrant right breast      03/05/2004 Surgery    Right lumpectomy: 0.9 cm grade 1 invasive lobular cancer, ER positive, PR negative, HER-2 negative, Ki-67 5%, sentinel lymph nodes negative      04/08/2004 - 05/09/2004 Radiation Therapy    Adjuvant radiation therapy      06/10/2004 - 06/09/2009 Anti-estrogen oral therapy    MA-27 trial and placed on the Aromasin arm. She completed one year of Aromasin, and it was discontinued due to severe joint aches and pains, followed by Tamoxifen for 2-3 years, and then Letrozole to complete out a total of 5 years        INTERVAL HISTORY:  [name] presents to the [facility] today for routine follow-up for her history of breast cancer.  Overall, she reports feeling quite well today.  She sees her PCP regularly along with her GYN.  She exercises with walking 30 minutes 4-5 times per week.  She undergoes skin cancer screening.  She is concerned regarding a new growth on her nipple that she noticed a couple of months ago.  She says that it is starting to get white around it.  She denies any nipple pain, itching, scaling, or discharge.      REVIEW OF SYSTEMS:  Review of Systems  Constitutional: Negative for appetite change, chills, fatigue, fever and unexpected weight change.  HENT:   Negative for hearing loss and lump/mass.   Eyes: Negative for eye problems and icterus.  Respiratory: Negative for chest tightness, cough and shortness of breath.   Cardiovascular: Negative for chest pain, leg swelling and palpitations.  Gastrointestinal: Negative for abdominal distention and abdominal pain.  Endocrine: Negative for hot flashes.  Musculoskeletal: Negative for  arthralgias.  Skin: Negative for itching and rash.  Neurological: Negative for dizziness, extremity weakness, headaches and numbness.  Hematological: Negative for adenopathy. Does not bruise/bleed easily.  Psychiatric/Behavioral: Negative for depression. The patient is not nervous/anxious.    Breast: Denies any new nodularity, masses, tenderness, nipple changes, or nipple discharge.       PAST MEDICAL/SURGICAL HISTORY:  Past Medical History:  Diagnosis Date  . Aortic valve stenosis, severe   . Breast cancer ([place])    right  . Hiatal hernia   . Hypercholesteremia   . Hypoglycemia   . Osteopenia   . Ulcer 2012   bleeding gastric Ulcer   Past Surgical History:  Procedure Laterality Date  . AORTIC VALVE REPLACEMENT N/A 11/16/2014   Procedure: AORTIC VALVE REPLACEMENT (AVR);  Surgeon: Gaye Pollack, MD;  Location: [facility];  Service: Open Heart Surgery;  Laterality: N/A;  . BREAST SURGERY    . CARDIAC CATHETERIZATION N/A 10/18/2014   Procedure: Right/Left Heart Cath and Coronary Angiography;  Surgeon: [name], MD;  Location: [facility];  Service: Cardiovascular;  Laterality: N/A;  . COSMETIC SURGERY     face  . PARATHYROIDECTOMY    . TEE WITHOUT CARDIOVERSION N/A 11/16/2014   Procedure: TRANSESOPHAGEAL ECHOCARDIOGRAM (TEE);  Surgeon: Gaye Pollack, MD;  Location: [facility];  Service: Open Heart Surgery;  Laterality: N/A;  . TONSILLECTOMY       ALLERGIES:  Allergies  Allergen Reactions  . Amoxicillin-Pot Clavulanate  Hives and Rash  . Penicillin G Rash  . Penicillins Hives and Rash  . Sulfa Antibiotics Rash  . Sulfamethoxazole Rash  . Sulfonamide Derivatives Hives     CURRENT MEDICATIONS:  Outpatient Encounter Prescriptions as of 11/07/2016  Medication Sig  . ALPRAZolam (XANAX) 0.5 MG tablet Take 1 tablet (0.5 mg total) by mouth at bedtime. May take additional dose as needed for stress or abdominal pain related to IBS.  Marland Kitchen aspirin EC 81 MG tablet Take 1 tablet  (81 mg total) by mouth daily.  . Calcium Carb-Cholecalciferol (CALCIUM 600+D3) 600-400 MG-UNIT TABS Take 1 tablet by mouth daily.  . Chloral Hydrate CRYS Take 10 mLs by mouth at bedtime as needed (Call  liquid into [facility]).  . Cholecalciferol (VITAMIN D-3) 1000 UNITS CAPS Take 1 capsule by mouth daily.   . clindamycin (CLEOCIN) 300 MG capsule Take 600 mg by mouth as directed. Take 600 mg (2 capsules) by mouth one hour prior to dental procedures  . Coenzyme Q10 (CO Q 10 PO) Take 1 capsule by mouth daily.  . Magnesium 400 MG CAPS Take 400 mg by mouth every morning.   Marland Kitchen Ophthalmic Irrigation Solution (OCUSOFT EYE [facility] OP) Apply 1 application to eye 2 (two) times daily as needed (dry eyes). Morning and before bed  . Polyethyl Glycol-Propyl Glycol (SYSTANE ULTRA PF) 0.4-0.3 % SOLN Apply 1 drop to eye 2 (two) times daily.  . Red Yeast Rice Extract (CVS RED YEAST RICE PO) Take 1,200 mg by mouth daily.   . Wheat Dextrin (BENEFIBER DRINK MIX PO) Take 5 mLs by mouth every morning. Mix with 1/2 a glass water, and takes with vitamins  . acetaminophen (TYLENOL) 500 MG tablet Take 500-1,000 mg by mouth at bedtime as needed for mild pain or moderate pain.   No facility-administered encounter medications on file as of 11/07/2016.      ONCOLOGIC FAMILY HISTORY:  Family History  Problem Relation Age of Onset  . Hypertension Mother   . Rectal cancer Mother   . Colon cancer Mother   . Kidney disease Father     GENETIC COUNSELING/TESTING: Underwent BRCA testing.  Declines re-referral to genetics at this time  SOCIAL HISTORY:  [name] is married lives with her husband in [place], [place].  She has 3 daughters who live in [place] and [place].  [name] is currently retired.  She denies any current or history of tobacco, alcohol, or illicit drug use.     PHYSICAL EXAMINATION:  Vital Signs: Vitals:   11/07/16 1304  BP: (!) 155/56  Pulse: (!) 58  Resp: 17   Temp: 97.8 F (36.6 C)   Filed Weights   11/07/16 1304  Weight: 157 lb 6.4 oz (71.4 kg)   General: Well-nourished, well-appearing female in no acute distress.  Unaccompanied today.   HEENT: Head is normocephalic.  Pupils equal and reactive to light. Conjunctivae clear without exudate.  Sclerae anicteric. Oral mucosa is pink, moist.  Oropharynx is pink without lesions or erythema.  Lymph: No cervical, supraclavicular, or infraclavicular lymphadenopathy noted on palpation.  Cardiovascular: Regular rate and rhythm.Marland Kitchen Respiratory: Clear to auscultation bilaterally. Chest expansion symmetric; breathing non-labored.  Breast Exam:  -Left breast: No appreciable masses on palpation. No skin redness, thickening, or peau d'orange appearance; no nipple retraction or nipple discharge;  -Right breast: No appreciable masses on palpation. No skin redness, thickening, or peau d'orange appearance; no nipple retraction or nipple discharge; mild distortion in symmetry at previous lumpectomy  site well healed scar without erythema or nodularity. See picture below of new nevus on right upper nipple. -Axilla: No axillary adenopathy bilaterally.  GI: Abdomen soft and round; non-tender, non-distended. Bowel sounds normoactive. No hepatosplenomegaly.   GU: Deferred.  Neuro: No focal deficits. Steady gait.  Psych: Mood and affect normal and appropriate for situation.  MSK: No focal spinal tenderness to palpation, full range of motion in bilateral upper extremities Extremities: No edema. Skin: Warm and dry.  Right nipple 11/07/2016   LABORATORY DATA:  None for this visit   DIAGNOSTIC IMAGING:  Most recent mammogram:    ASSESSMENT AND PLAN:  Ms.. Rosales is a pleasant 83 y.o. female with history of Stage IA right breast invasive lobular carcinoma, ER+/PR-/HER2-, diagnosed in 01/2004, treated with lumpectomy, adjuvant radiation therapy, and anti-estrogen therapy for 5 years (Aromasin, Tamoxifen and Letrozole).   She presents to the [facility] for surveillance and routine follow-up.   1. History of breast cancer:  [name] is currently clinically and radiographically without evidence of disease or recurrence of breast cancer. She will be due for mammogram in 01/2017; orders placed today.  I encouraged her to call me with any questions or concerns before her next visit at the cancer center, and I would be happy to see her sooner, if needed.    2. Bone health:  Given [name]'s age, history of breast cancer, and her previous anti-estrogen therapy with Aromasin and Letrozole, she is at risk for bone demineralization. Her last DEXA scan was in 08/2016 and demonstrated osteopenia in her hips bilaterally.  She was encouraged to increase her consumption of foods rich in calcium, as well as increase her weight-bearing activities.  She was given education on specific food and activities to promote bone health.  3. Cancer screening:  Due to [name]'s history and her age, she should receive screening for skin cancers, colon cancer, and gynecologic cancers. She was encouraged to follow-up with her PCP for appropriate cancer screenings.   4. Health maintenance and wellness promotion: [name] was encouraged to consume 5-7 servings of fruits and vegetables per day. She was also encouraged to engage in moderate to vigorous exercise for 30 minutes per day most days of the week. She was instructed to limit her alcohol consumption and continue to abstain from tobacco use.   5. Nipple change: I had Dr. Donne Hazel review her nipple lesion in epic and he will see her in about 4-6 weeks and see how it looks in about 4-6 weeks.    Dispo:  -Return to cancer center in one year for LTS follow up -Mammogram in 01/2017  A total of (30) minutes of face-to-face time was spent with this patient with greater than 50% of that time in counseling and care-coordination.   Gardenia Phlegm, NP Survivorship  Program [facility] [phone]   Note: PRIMARY CARE PROVIDER [name], [place] [phone]

## 2016-11-12 ENCOUNTER — Encounter: Payer: Self-pay | Admitting: Internal Medicine

## 2016-11-17 ENCOUNTER — Other Ambulatory Visit: Payer: Self-pay | Admitting: Emergency Medicine

## 2016-11-17 MED ORDER — CHLORAL HYDRATE CRYS: 10.0000 mL | CRYSTALS | Freq: Every evening | 1 refills | Status: DC | PRN

## 2016-12-03 DIAGNOSIS — L239 Allergic contact dermatitis, unspecified cause: Secondary | ICD-10-CM | POA: Diagnosis not present

## 2016-12-03 DIAGNOSIS — L282 Other prurigo: Secondary | ICD-10-CM | POA: Diagnosis not present

## 2016-12-11 DIAGNOSIS — N649 Disorder of breast, unspecified: Secondary | ICD-10-CM | POA: Diagnosis not present

## 2017-01-12 DIAGNOSIS — L814 Other melanin hyperpigmentation: Secondary | ICD-10-CM | POA: Diagnosis not present

## 2017-01-12 DIAGNOSIS — L821 Other seborrheic keratosis: Secondary | ICD-10-CM | POA: Diagnosis not present

## 2017-01-12 DIAGNOSIS — D1801 Hemangioma of skin and subcutaneous tissue: Secondary | ICD-10-CM | POA: Diagnosis not present

## 2017-01-12 DIAGNOSIS — D225 Melanocytic nevi of trunk: Secondary | ICD-10-CM | POA: Diagnosis not present

## 2017-01-12 DIAGNOSIS — D2271 Melanocytic nevi of right lower limb, including hip: Secondary | ICD-10-CM | POA: Diagnosis not present

## 2017-01-12 DIAGNOSIS — D2261 Melanocytic nevi of right upper limb, including shoulder: Secondary | ICD-10-CM | POA: Diagnosis not present

## 2017-01-12 DIAGNOSIS — D485 Neoplasm of uncertain behavior of skin: Secondary | ICD-10-CM | POA: Diagnosis not present

## 2017-01-19 DIAGNOSIS — H2513 Age-related nuclear cataract, bilateral: Secondary | ICD-10-CM | POA: Diagnosis not present

## 2017-01-22 DIAGNOSIS — H11823 Conjunctivochalasis, bilateral: Secondary | ICD-10-CM | POA: Diagnosis not present

## 2017-01-24 ENCOUNTER — Telehealth: Payer: Self-pay | Admitting: Internal Medicine

## 2017-01-24 DIAGNOSIS — R918 Other nonspecific abnormal finding of lung field: Secondary | ICD-10-CM

## 2017-01-24 NOTE — Telephone Encounter (Signed)
She is due for a repeat ct scan of her lung to recheck the lung nodule - we discussed getting it done in October prior to her going to [facility].  Let her know I have ordered this.

## 2017-01-26 NOTE — Telephone Encounter (Signed)
LVm informing pt.

## 2017-01-27 ENCOUNTER — Encounter: Payer: Self-pay | Admitting: Internal Medicine

## 2017-01-27 ENCOUNTER — Ambulatory Visit (INDEPENDENT_AMBULATORY_CARE_PROVIDER_SITE_OTHER): Payer: Medicare Other

## 2017-01-27 ENCOUNTER — Other Ambulatory Visit (INDEPENDENT_AMBULATORY_CARE_PROVIDER_SITE_OTHER): Payer: Medicare Other

## 2017-01-27 DIAGNOSIS — Z23 Encounter for immunization: Secondary | ICD-10-CM | POA: Diagnosis not present

## 2017-01-27 DIAGNOSIS — E7800 Pure hypercholesterolemia, unspecified: Secondary | ICD-10-CM

## 2017-01-27 LAB — COMPREHENSIVE METABOLIC PANEL
ALBUMIN: 4.2 g/dL (ref 3.5–5.2)
ALK PHOS: 67 U/L (ref 39–117)
ALT: 17 U/L (ref 0–35)
AST: 21 U/L (ref 0–37)
BILIRUBIN TOTAL: 0.6 mg/dL (ref 0.2–1.2)
BUN: 14 mg/dL (ref 6–23)
CO2: 29 mEq/L (ref 19–32)
Calcium: 9.2 mg/dL (ref 8.4–10.5)
Chloride: 98 mEq/L (ref 96–112)
Creatinine, Ser: 0.73 mg/dL (ref 0.40–1.20)
GFR: 81.13 mL/min (ref >60.00)
Glucose, Bld: 189 mg/dL — ABNORMAL HIGH (ref 70–99)
POTASSIUM: 4.4 meq/L (ref 3.5–5.1)
SODIUM: 135 meq/L (ref 135–145)
TOTAL PROTEIN: 6.4 g/dL (ref 6.0–8.3)

## 2017-01-27 LAB — LIPID PANEL
CHOLESTEROL: 248 mg/dL — AB (ref 0–200)
HDL: 79.9 mg/dL (ref >39.00)
LDL Cholesterol: 143 mg/dL — ABNORMAL HIGH (ref 0–99)
NonHDL: 168.56
Total CHOL/HDL Ratio: 3
Triglycerides: 130 mg/dL (ref 0.0–149.0)
VLDL: 26 mg/dL (ref 0.0–40.0)

## 2017-02-02 ENCOUNTER — Ambulatory Visit (INDEPENDENT_AMBULATORY_CARE_PROVIDER_SITE_OTHER)
Admission: RE | Admit: 2017-02-02 | Discharge: 2017-02-02 | Disposition: A | Discharge status: Home / Self Care | Payer: Medicare Other | Source: Ambulatory Visit | Attending: Internal Medicine | Admitting: Internal Medicine

## 2017-02-02 DIAGNOSIS — R918 Other nonspecific abnormal finding of lung field: Secondary | ICD-10-CM | POA: Diagnosis not present

## 2017-02-03 ENCOUNTER — Other Ambulatory Visit: Payer: Medicare Other

## 2017-02-03 ENCOUNTER — Encounter (HOSPITAL_COMMUNITY): Payer: Medicare Other

## 2017-02-03 ENCOUNTER — Other Ambulatory Visit (HOSPITAL_COMMUNITY): Payer: Self-pay | Admitting: Dermatology

## 2017-02-03 DIAGNOSIS — L723 Sebaceous cyst: Secondary | ICD-10-CM | POA: Diagnosis not present

## 2017-02-03 DIAGNOSIS — M79661 Pain in right lower leg: Secondary | ICD-10-CM

## 2017-02-04 ENCOUNTER — Ambulatory Visit (HOSPITAL_COMMUNITY)
Admission: RE | Admit: 2017-02-04 | Discharge: 2017-02-04 | Disposition: A | Discharge status: Home / Self Care | Payer: Medicare Other | Source: Ambulatory Visit | Attending: Vascular Surgery | Admitting: Vascular Surgery

## 2017-02-04 DIAGNOSIS — M79661 Pain in right lower leg: Secondary | ICD-10-CM | POA: Diagnosis present

## 2017-02-05 ENCOUNTER — Encounter (HOSPITAL_COMMUNITY): Payer: Medicare Other

## 2017-02-11 ENCOUNTER — Encounter: Payer: Self-pay | Admitting: Cardiovascular Disease

## 2017-02-13 DIAGNOSIS — B078 Other viral warts: Secondary | ICD-10-CM | POA: Diagnosis not present

## 2017-02-25 DIAGNOSIS — Z853 Personal history of malignant neoplasm of breast: Secondary | ICD-10-CM | POA: Diagnosis not present

## 2017-02-25 DIAGNOSIS — Z1231 Encounter for screening mammogram for malignant neoplasm of breast: Secondary | ICD-10-CM | POA: Diagnosis not present

## 2017-02-26 DIAGNOSIS — Z170 Estrogen receptor positive status [ER+]: Secondary | ICD-10-CM | POA: Diagnosis not present

## 2017-02-26 DIAGNOSIS — Z1231 Encounter for screening mammogram for malignant neoplasm of breast: Secondary | ICD-10-CM | POA: Diagnosis not present

## 2017-02-26 DIAGNOSIS — E782 Mixed hyperlipidemia: Secondary | ICD-10-CM | POA: Diagnosis not present

## 2017-02-26 DIAGNOSIS — C50411 Malignant neoplasm of upper-outer quadrant of right female breast: Secondary | ICD-10-CM | POA: Diagnosis not present

## 2017-03-17 ENCOUNTER — Other Ambulatory Visit: Payer: Self-pay | Admitting: Adult Health

## 2017-03-17 DIAGNOSIS — N6489 Other specified disorders of breast: Secondary | ICD-10-CM

## 2017-03-24 DIAGNOSIS — J019 Acute sinusitis, unspecified: Secondary | ICD-10-CM | POA: Diagnosis not present

## 2017-03-24 DIAGNOSIS — J209 Acute bronchitis, unspecified: Secondary | ICD-10-CM | POA: Diagnosis not present

## 2017-03-24 DIAGNOSIS — H6123 Impacted cerumen, bilateral: Secondary | ICD-10-CM | POA: Diagnosis not present

## 2017-03-24 DIAGNOSIS — R05 Cough: Secondary | ICD-10-CM | POA: Diagnosis not present

## 2017-03-25 ENCOUNTER — Other Ambulatory Visit: Payer: Self-pay | Admitting: Adult Health

## 2017-03-25 DIAGNOSIS — N6320 Unspecified lump in the left breast, unspecified quadrant: Secondary | ICD-10-CM

## 2017-03-31 DIAGNOSIS — J309 Allergic rhinitis, unspecified: Secondary | ICD-10-CM | POA: Diagnosis not present

## 2017-03-31 DIAGNOSIS — J310 Chronic rhinitis: Secondary | ICD-10-CM | POA: Diagnosis not present

## 2017-04-07 DIAGNOSIS — J309 Allergic rhinitis, unspecified: Secondary | ICD-10-CM | POA: Diagnosis not present

## 2017-04-07 DIAGNOSIS — J310 Chronic rhinitis: Secondary | ICD-10-CM | POA: Diagnosis not present

## 2017-04-16 DIAGNOSIS — R05 Cough: Secondary | ICD-10-CM | POA: Diagnosis not present

## 2017-04-23 ENCOUNTER — Other Ambulatory Visit: Payer: Self-pay | Admitting: Emergency Medicine

## 2017-04-23 DIAGNOSIS — R928 Other abnormal and inconclusive findings on diagnostic imaging of breast: Secondary | ICD-10-CM | POA: Diagnosis not present

## 2017-04-23 DIAGNOSIS — N6489 Other specified disorders of breast: Secondary | ICD-10-CM | POA: Diagnosis not present

## 2017-04-23 MED ORDER — CHLORAL HYDRATE CRYS: 10.0000 mL | CRYSTALS | Freq: Every evening | 0 refills | Status: DC | PRN

## 2017-04-23 NOTE — Telephone Encounter (Signed)
[place] Controlled Substance Database checked. Last filled on 02/07/17. Called pharmacy and gave verbals to fill on 05/07/16

## 2017-05-04 DIAGNOSIS — H35373 Puckering of macula, bilateral: Secondary | ICD-10-CM | POA: Diagnosis not present

## 2017-05-04 DIAGNOSIS — H348312 Tributary (branch) retinal vein occlusion, right eye, stable: Secondary | ICD-10-CM | POA: Diagnosis not present

## 2017-05-06 ENCOUNTER — Other Ambulatory Visit: Payer: Self-pay | Admitting: Adult Health

## 2017-05-06 DIAGNOSIS — N6320 Unspecified lump in the left breast, unspecified quadrant: Secondary | ICD-10-CM

## 2017-05-27 DIAGNOSIS — J101 Influenza due to other identified influenza virus with other respiratory manifestations: Secondary | ICD-10-CM | POA: Diagnosis not present

## 2017-06-24 ENCOUNTER — Other Ambulatory Visit: Payer: Self-pay | Admitting: Adult Health

## 2017-06-24 DIAGNOSIS — N6489 Other specified disorders of breast: Secondary | ICD-10-CM

## 2017-07-24 ENCOUNTER — Telehealth: Payer: Self-pay | Admitting: Internal Medicine

## 2017-07-24 DIAGNOSIS — D3141 Benign neoplasm of right ciliary body: Secondary | ICD-10-CM | POA: Diagnosis not present

## 2017-07-24 DIAGNOSIS — H35371 Puckering of macula, right eye: Secondary | ICD-10-CM | POA: Diagnosis not present

## 2017-07-24 DIAGNOSIS — H04123 Dry eye syndrome of bilateral lacrimal glands: Secondary | ICD-10-CM | POA: Diagnosis not present

## 2017-07-24 DIAGNOSIS — H2513 Age-related nuclear cataract, bilateral: Secondary | ICD-10-CM | POA: Diagnosis not present

## 2017-07-24 DIAGNOSIS — H348312 Tributary (branch) retinal vein occlusion, right eye, stable: Secondary | ICD-10-CM | POA: Diagnosis not present

## 2017-07-24 DIAGNOSIS — Z961 Presence of intraocular lens: Secondary | ICD-10-CM | POA: Insufficient documentation

## 2017-07-24 MED ORDER — ALPRAZOLAM 0.5 MG PO TABS: 0.5000 mg | ORAL_TABLET | Freq: Every day | ORAL | 0 refills | Status: DC

## 2017-07-24 NOTE — Telephone Encounter (Signed)
Rx sent. Will need office visit for additional refills.

## 2017-07-27 ENCOUNTER — Other Ambulatory Visit: Payer: Self-pay | Admitting: Internal Medicine

## 2017-07-27 MED ORDER — CHLORAL HYDRATE CRYS: 10.0000 mL | CRYSTALS | Freq: Every evening | 1 refills | Status: DC | PRN

## 2017-08-20 ENCOUNTER — Telehealth: Payer: Self-pay | Admitting: Emergency Medicine

## 2017-08-20 NOTE — Telephone Encounter (Signed)
Called patient to schedule AWV. Patient not home but will call back.

## 2017-08-31 DIAGNOSIS — L720 Epidermal cyst: Secondary | ICD-10-CM | POA: Diagnosis not present

## 2017-08-31 DIAGNOSIS — L821 Other seborrheic keratosis: Secondary | ICD-10-CM | POA: Diagnosis not present

## 2017-08-31 DIAGNOSIS — L245 Irritant contact dermatitis due to other chemical products: Secondary | ICD-10-CM | POA: Diagnosis not present

## 2017-09-03 ENCOUNTER — Ambulatory Visit (INDEPENDENT_AMBULATORY_CARE_PROVIDER_SITE_OTHER): Payer: Medicare Other | Admitting: Family Medicine

## 2017-09-03 ENCOUNTER — Encounter: Payer: Self-pay | Admitting: Family Medicine

## 2017-09-03 VITALS — BP 132/68 | HR 87 | Temp 98.1°F | Ht 64.0 in | Wt 159.0 lb

## 2017-09-03 DIAGNOSIS — H9201 Otalgia, right ear: Secondary | ICD-10-CM

## 2017-09-03 MED ORDER — AZITHROMYCIN 250 MG PO TABS: ORAL_TABLET | ORAL | 0 refills | Status: DC

## 2017-09-03 NOTE — Progress Notes (Signed)
[name] - 84 y.o. female MRN [id number]  Date of birth: 10/26/1932  SUBJECTIVE:  Including CC & ROS.  Chief Complaint  Patient presents with  . Otalgia  . Sore Throat    [name] is a 84 y.o. female that is presenting with ear pain and sore throat. Symptoms have been ongoing four days. Admits to bilateral ear pain. Her throat feels scratchy. Denies fevers or body aches.     Review of Systems  Constitutional: Negative for fever.  HENT: Positive for ear pain, rhinorrhea and sore throat.   Cardiovascular: Negative for chest pain.    HISTORY: Past Medical, Surgical, Social, and Family History Reviewed & Updated per EMR.   Pertinent Historical Findings include:  Past Medical History:  Diagnosis Date  . Aortic valve stenosis, severe   . Breast cancer ([place])    right  . Hiatal hernia   . Hypercholesteremia   . Hypoglycemia   . Osteopenia   . Ulcer 2012   bleeding gastric Ulcer    Past Surgical History:  Procedure Laterality Date  . AORTIC VALVE REPLACEMENT N/A 11/16/2014   Procedure: AORTIC VALVE REPLACEMENT (AVR);  Surgeon: Gaye Pollack, MD;  Location: [facility];  Service: Open Heart Surgery;  Laterality: N/A;  . BREAST SURGERY    . CARDIAC CATHETERIZATION N/A 10/18/2014   Procedure: Right/Left Heart Cath and Coronary Angiography;  Surgeon: [name], MD;  Location: [facility];  Service: Cardiovascular;  Laterality: N/A;  . COSMETIC SURGERY     face  . PARATHYROIDECTOMY    . TEE WITHOUT CARDIOVERSION N/A 11/16/2014   Procedure: TRANSESOPHAGEAL ECHOCARDIOGRAM (TEE);  Surgeon: Gaye Pollack, MD;  Location: [facility];  Service: Open Heart Surgery;  Laterality: N/A;  . TONSILLECTOMY      Allergies  Allergen Reactions  . Amoxicillin-Pot Clavulanate Hives and Rash  . Penicillin G Rash  . Penicillins Hives and Rash  . Sulfa Antibiotics Rash  . Sulfamethoxazole Rash  . Sulfonamide Derivatives Hives    Family History  Problem Relation Age of Onset   . Hypertension Mother   . Rectal cancer Mother   . Colon cancer Mother   . Kidney disease Father      Social History   Socioeconomic History  . Marital status: Married    Spouse name: Not on file  . Number of children: Not on file  . Years of education: Not on file  . Highest education level: Not on file  Occupational History  . Not on file  Social Needs  . Financial resource strain: Not on file  . Food insecurity:    Worry: Not on file    Inability: Not on file  . Transportation needs:    Medical: Not on file    Non-medical: Not on file  Tobacco Use  . Smoking status: Former Smoker    Packs/day: 0.50    Years: 30.00    Pack years: 15.00    Types: Cigarettes  . Smokeless tobacco: Never Used  . Tobacco comment: quit smoking 1980  Substance and Sexual Activity  . Alcohol use: Not on file  . Drug use: No  . Sexual activity: Not on file  Lifestyle  . Physical activity:    Days per week: Not on file    Minutes per session: Not on file  . Stress: Not on file  Relationships  . Social connections:    Talks on phone: Not on file    Gets together: Not on  file    Attends religious service: Not on file    Active member of club or organization: Not on file    Attends meetings of clubs or organizations: Not on file    Relationship status: Not on file  . Intimate partner violence:    Fear of current or ex partner: Not on file    Emotionally abused: Not on file    Physically abused: Not on file    Forced sexual activity: Not on file  Other Topics Concern  . Not on file  Social History Narrative  . Not on file     PHYSICAL EXAM:  VS: BP 132/68 (BP Location: Left Arm, Patient Position: Sitting, Cuff Size: Normal)   Pulse 87   Temp 98.1 F (36.7 C) (Oral)   Ht 5\' 4"  (1.626 m)   Wt 159 lb (72.1 kg)   SpO2 98%   BMI 27.29 kg/m  Physical Exam Gen: NAD, alert, cooperative with exam,  ENT: normal lips, normal nasal mucosa, tympanic membrane clear and intact on  left, right TM with bullous changes, normal oropharynx,  Eye: normal EOM, normal conjunctiva and lids CV:  no edema, +2 pedal pulses, regular rate and rhythm, S1-S2   Resp: no accessory muscle use, non-labored, clear to auscultation bilaterally, no crackles or wheezes Skin: no rashes, no areas of induration  Neuro: normal tone, normal sensation to touch Psych:  normal insight, alert and oriented MSK: Normal gait, normal strength       ASSESSMENT & PLAN:   Right ear pain Appears to have bullous change in the TM on the right. Unclear if this is acute or chronic  - provided azithromycin  - counseled on supportive change - given indications to return.

## 2017-09-03 NOTE — Patient Instructions (Signed)
Please try things such as zyrtec-D or allegra-D which is an antihistamine and decongestant.   Please try afrin which will help with nasal congestion but use for only three days.   Please also try using a netti pot on a regular occasion.  Honey can help with a sore throat.   Continue the flonase   Please try the azithromycin if your symptoms don't improve over the next couple of days.

## 2017-09-04 DIAGNOSIS — H9201 Otalgia, right ear: Secondary | ICD-10-CM | POA: Insufficient documentation

## 2017-09-04 NOTE — Assessment & Plan Note (Signed)
Appears to have bullous change in the TM on the right. Unclear if this is acute or chronic  - provided azithromycin  - counseled on supportive change - given indications to return.

## 2017-09-08 ENCOUNTER — Telehealth: Payer: Self-pay | Admitting: Internal Medicine

## 2017-09-08 DIAGNOSIS — H9209 Otalgia, unspecified ear: Secondary | ICD-10-CM

## 2017-09-08 NOTE — Telephone Encounter (Signed)
Yes, will refer to ENT

## 2017-09-08 NOTE — Telephone Encounter (Signed)
Copied from [facility] [id number]. Topic: Referral - Request >> Sep 08, 2017  4:05 PM [name] wrote: Reason for CRM: pt called stating she is still having ear pain after completing the steroid pack. Pt is asking if she needs referral to ENT. Please call to advise.

## 2017-09-09 NOTE — Telephone Encounter (Signed)
Referral sent to Dr. Berle Mull office and pt has been informed

## 2017-09-09 NOTE — Telephone Encounter (Signed)
LVM informing pt

## 2017-09-14 DIAGNOSIS — H6691 Otitis media, unspecified, right ear: Secondary | ICD-10-CM | POA: Diagnosis not present

## 2017-09-14 DIAGNOSIS — J320 Chronic maxillary sinusitis: Secondary | ICD-10-CM | POA: Diagnosis not present

## 2017-09-14 DIAGNOSIS — J322 Chronic ethmoidal sinusitis: Secondary | ICD-10-CM | POA: Diagnosis not present

## 2017-09-14 DIAGNOSIS — H6121 Impacted cerumen, right ear: Secondary | ICD-10-CM | POA: Diagnosis not present

## 2017-09-14 DIAGNOSIS — J301 Allergic rhinitis due to pollen: Secondary | ICD-10-CM | POA: Diagnosis not present

## 2017-09-14 DIAGNOSIS — J3081 Allergic rhinitis due to animal (cat) (dog) hair and dander: Secondary | ICD-10-CM | POA: Diagnosis not present

## 2017-09-14 DIAGNOSIS — H6061 Unspecified chronic otitis externa, right ear: Secondary | ICD-10-CM | POA: Diagnosis not present

## 2017-09-23 DIAGNOSIS — J3081 Allergic rhinitis due to animal (cat) (dog) hair and dander: Secondary | ICD-10-CM | POA: Diagnosis not present

## 2017-09-23 DIAGNOSIS — J301 Allergic rhinitis due to pollen: Secondary | ICD-10-CM | POA: Diagnosis not present

## 2017-09-23 DIAGNOSIS — J3089 Other allergic rhinitis: Secondary | ICD-10-CM | POA: Diagnosis not present

## 2017-10-01 DIAGNOSIS — J322 Chronic ethmoidal sinusitis: Secondary | ICD-10-CM | POA: Diagnosis not present

## 2017-10-01 DIAGNOSIS — J320 Chronic maxillary sinusitis: Secondary | ICD-10-CM | POA: Diagnosis not present

## 2017-10-01 DIAGNOSIS — H6521 Chronic serous otitis media, right ear: Secondary | ICD-10-CM | POA: Diagnosis not present

## 2017-10-01 NOTE — Progress Notes (Signed)
Subjective:    Patient ID: [name], female    DOB: March 26, 1933, 84 y.o.   MRN: [id number]  HPI The patient is here for follow up.  Hyperlipidemia: She has never been on medication.  She is taking red yeast rice.  She is exercising regularly - walking.  She has a strong family history of hyperlipidemia.  Prediabetes:  She is compliant with a low sugar/carbohydrate diet.  She is exercising regularly.  Insomnia: She is taking chloral hydrate nightly and has been on this for years. She denies any side effects.  Medication works well.  On occasion if her stress level is very high she will still have difficulty sleeping.  Anxiety: She was prescribed Xanax by GI for her IBS and anxiety.  She only takes it as needed.  She has taken xanax about 4 times in the past month. Her stress level is high due to closing on two houses, moving and her husband has dementia.  She is taking the Xanax close to bedtime to help her relax.  She is also taking it occasionally during the day.  Osteopenia, high FRAX: She is exercising regularly- walks.  She is taking calcium and vitamin D daily.  She is not interested in taking any medications for her bones.    Medications and allergies reviewed with patient and updated if appropriate.  Patient Active Problem List   Diagnosis Date Noted  . Right ear pain 09/04/2017  . Prediabetes 09/29/2016  . Lung nodule < 6cm on CT 09/29/2016  . Leg cramps 01/29/2016  . Insomnia 09/12/2015  . Rectal bleed 09/12/2015  . S/P AVR 11/16/2014  . Branch retinal vein occlusion 10/12/2013  . Aortic valve stenosis, severe   . ALLERGIC RHINITIS DUE TO OTHER ALLERGEN 09/10/2009  . Hyperlipidemia 11/30/2006  . HYPOGLYCEMIA NEC 11/20/2006  . MITRAL VALVE PROLAPSE 10/29/2006  . Osteopenia 10/29/2006  . History of right breast cancer 10/29/2006    Current Outpatient Medications on File Prior to Visit  Medication Sig Dispense Refill  . acetaminophen (TYLENOL) 500 MG  tablet Take 500-1,000 mg by mouth at bedtime as needed for mild pain or moderate pain.    Marland Kitchen ALPRAZolam (XANAX) 0.5 MG tablet Take 1 tablet (0.5 mg total) by mouth at bedtime. May take additional dose as needed for stress or abdominal pain related to IBS. NEEDS OFFICE VISIT FOR FURTHER REFILLS 120 tablet 0  . aspirin EC 81 MG tablet Take 1 tablet (81 mg total) by mouth daily.    . Calcium Carb-Cholecalciferol (CALCIUM 600+D3) 600-400 MG-UNIT TABS Take 1 tablet by mouth daily.    . Chloral Hydrate CRYS Take 10 mLs by mouth at bedtime as needed (Call  liquid into [place]). 900 g 1  . Cholecalciferol (VITAMIN D-3) 1000 UNITS CAPS Take 1 capsule by mouth daily.     . clindamycin (CLEOCIN) 300 MG capsule Take 600 mg by mouth as directed. Take 600 mg (2 capsules) by mouth one hour prior to dental procedures    . Coenzyme Q10 (CO Q 10 PO) Take 1 capsule by mouth daily.    . Magnesium 400 MG CAPS Take 400 mg by mouth every morning.     Marland Kitchen Ophthalmic Irrigation Solution (OCUSOFT EYE [place] OP) Apply 1 application to eye 2 (two) times daily as needed (dry eyes). Morning and before bed    . Polyethyl Glycol-Propyl Glycol (SYSTANE ULTRA PF) 0.4-0.3 % SOLN Apply 1 drop to eye 2 (two) times daily.    Marland Kitchen  Red Yeast Rice Extract ([facility] RED YEAST RICE PO) Take 1,200 mg by mouth daily.     . Wheat Dextrin (BENEFIBER DRINK MIX PO) Take 5 mLs by mouth every morning. Mix with 1/2 a glass water, and takes with vitamins     No current facility-administered medications on file prior to visit.     Past Medical History:  Diagnosis Date  . Aortic valve stenosis, severe   . Breast cancer ([place])    right  . Hiatal hernia   . Hypercholesteremia   . Hypoglycemia   . Osteopenia   . Ulcer 2012   bleeding gastric Ulcer    Past Surgical History:  Procedure Laterality Date  . AORTIC VALVE REPLACEMENT N/A 11/16/2014   Procedure: AORTIC VALVE REPLACEMENT (AVR);  Surgeon: Gaye Pollack, MD;  Location: [facility];   Service: Open Heart Surgery;  Laterality: N/A;  . BREAST SURGERY    . CARDIAC CATHETERIZATION N/A 10/18/2014   Procedure: Right/Left Heart Cath and Coronary Angiography;  Surgeon: [name], MD;  Location: [facility];  Service: Cardiovascular;  Laterality: N/A;  . COSMETIC SURGERY     face  . PARATHYROIDECTOMY    . TEE WITHOUT CARDIOVERSION N/A 11/16/2014   Procedure: TRANSESOPHAGEAL ECHOCARDIOGRAM (TEE);  Surgeon: Gaye Pollack, MD;  Location: [facility];  Service: Open Heart Surgery;  Laterality: N/A;  . TONSILLECTOMY      Social History   Socioeconomic History  . Marital status: Married    Spouse name: Not on file  . Number of children: Not on file  . Years of education: Not on file  . Highest education level: Not on file  Occupational History  . Not on file  Social Needs  . Financial resource strain: Not on file  . Food insecurity:    Worry: Not on file    Inability: Not on file  . Transportation needs:    Medical: Not on file    Non-medical: Not on file  Tobacco Use  . Smoking status: Former Smoker    Packs/day: 0.50    Years: 30.00    Pack years: 15.00    Types: Cigarettes  . Smokeless tobacco: Never Used  . Tobacco comment: quit smoking 1980  Substance and Sexual Activity  . Alcohol use: Not on file  . Drug use: No  . Sexual activity: Not on file  Lifestyle  . Physical activity:    Days per week: Not on file    Minutes per session: Not on file  . Stress: Not on file  Relationships  . Social connections:    Talks on phone: Not on file    Gets together: Not on file    Attends religious service: Not on file    Active member of club or organization: Not on file    Attends meetings of clubs or organizations: Not on file    Relationship status: Not on file  Other Topics Concern  . Not on file  Social History Narrative  . Not on file    Family History  Problem Relation Age of Onset  . Hypertension Mother   . Rectal cancer Mother   . Colon cancer  Mother   . Kidney disease Father     Review of Systems  Constitutional: Negative for chills and fever.  Respiratory: Positive for cough (allergy related). Negative for shortness of breath and wheezing.   Cardiovascular: Positive for palpitations (stress related). Negative for chest pain and leg swelling.  Neurological: Positive for  headaches. Negative for light-headedness.       Objective:   Vitals:   10/02/17 1110  BP: 132/86  Pulse: (!) 59  Resp: 16  Temp: 97.9 F (36.6 C)  SpO2: 98%   BP Readings from Last 3 Encounters:  10/02/17 132/86  09/03/17 132/68  11/07/16 (!) 155/56   Wt Readings from Last 3 Encounters:  10/02/17 153 lb (69.4 kg)  09/03/17 159 lb (72.1 kg)  11/07/16 157 lb 6.4 oz (71.4 kg)   Body mass index is 26.26 kg/m.   Physical Exam    Constitutional: Appears well-developed and well-nourished. No distress.  HENT:  Head: Normocephalic and atraumatic.  Neck: Neck supple. No tracheal deviation present. No thyromegaly present.  No cervical lymphadenopathy Cardiovascular: Normal rate, regular rhythm and normal heart sounds.   1/6 systolic murmur heard. No carotid bruit .  No edema Pulmonary/Chest: Effort normal and breath sounds normal. No respiratory distress. No has no wheezes. No rales.  Skin: Skin is warm and dry. Not diaphoretic.  Psychiatric: Normal mood and affect. Behavior is normal.      Assessment & Plan:    See Problem List for Assessment and Plan of chronic medical problems.

## 2017-10-01 NOTE — Patient Instructions (Addendum)

## 2017-10-02 ENCOUNTER — Ambulatory Visit (INDEPENDENT_AMBULATORY_CARE_PROVIDER_SITE_OTHER): Payer: Medicare Other | Admitting: *Deleted

## 2017-10-02 ENCOUNTER — Encounter: Payer: Self-pay | Admitting: Internal Medicine

## 2017-10-02 ENCOUNTER — Other Ambulatory Visit (INDEPENDENT_AMBULATORY_CARE_PROVIDER_SITE_OTHER): Payer: Medicare Other

## 2017-10-02 ENCOUNTER — Ambulatory Visit (INDEPENDENT_AMBULATORY_CARE_PROVIDER_SITE_OTHER): Payer: Medicare Other | Admitting: Internal Medicine

## 2017-10-02 VITALS — BP 132/86 | HR 59 | Resp 18 | Ht 64.0 in | Wt 153.0 lb

## 2017-10-02 VITALS — BP 132/86 | HR 59 | Temp 97.9°F | Resp 16 | Wt 153.0 lb

## 2017-10-02 DIAGNOSIS — F5101 Primary insomnia: Secondary | ICD-10-CM

## 2017-10-02 DIAGNOSIS — E782 Mixed hyperlipidemia: Secondary | ICD-10-CM

## 2017-10-02 DIAGNOSIS — F419 Anxiety disorder, unspecified: Secondary | ICD-10-CM

## 2017-10-02 DIAGNOSIS — R7303 Prediabetes: Secondary | ICD-10-CM | POA: Diagnosis not present

## 2017-10-02 DIAGNOSIS — Z0000 Encounter for general adult medical examination without abnormal findings: Secondary | ICD-10-CM

## 2017-10-02 DIAGNOSIS — M85869 Other specified disorders of bone density and structure, unspecified lower leg: Secondary | ICD-10-CM | POA: Diagnosis not present

## 2017-10-02 LAB — LIPID PANEL
Cholesterol: 263 mg/dL — ABNORMAL HIGH (ref 0–200)
HDL: 84.2 mg/dL (ref >39.00)
LDL CALC: 160 mg/dL — AB (ref 0–99)
NonHDL: 179.24
TRIGLYCERIDES: 94 mg/dL (ref 0.0–149.0)
Total CHOL/HDL Ratio: 3
VLDL: 18.8 mg/dL (ref 0.0–40.0)

## 2017-10-02 LAB — HEMOGLOBIN A1C: Hgb A1c MFr Bld: 5.9 % (ref 4.6–6.5)

## 2017-10-02 LAB — COMPREHENSIVE METABOLIC PANEL
ALBUMIN: 4.3 g/dL (ref 3.5–5.2)
ALT: 14 U/L (ref 0–35)
AST: 21 U/L (ref 0–37)
Alkaline Phosphatase: 69 U/L (ref 39–117)
BUN: 14 mg/dL (ref 6–23)
CALCIUM: 9.8 mg/dL (ref 8.4–10.5)
CHLORIDE: 101 meq/L (ref 96–112)
CO2: 26 mEq/L (ref 19–32)
CREATININE: 0.79 mg/dL (ref 0.40–1.20)
GFR: 73.94 mL/min (ref >60.00)
Glucose, Bld: 92 mg/dL (ref 70–99)
POTASSIUM: 4.5 meq/L (ref 3.5–5.1)
Sodium: 137 mEq/L (ref 135–145)
Total Bilirubin: 0.6 mg/dL (ref 0.2–1.2)
Total Protein: 6.7 g/dL (ref 6.0–8.3)

## 2017-10-02 LAB — CBC WITH DIFFERENTIAL/PLATELET
BASOS ABS: 0.1 10*3/uL (ref 0.0–0.1)
Basophils Relative: 0.9 % (ref 0.0–3.0)
EOS ABS: 0.1 10*3/uL (ref 0.0–0.7)
Eosinophils Relative: 0.8 % (ref 0.0–5.0)
HCT: 42.7 % (ref 36.0–46.0)
HEMOGLOBIN: 14.4 g/dL (ref 12.0–15.0)
Lymphocytes Relative: 40.8 % (ref 12.0–46.0)
Lymphs Abs: 2.9 10*3/uL (ref 0.7–4.0)
MCHC: 33.7 g/dL (ref 30.0–36.0)
MCV: 95.4 fl (ref 78.0–100.0)
MONOS PCT: 8.7 % (ref 3.0–12.0)
Monocytes Absolute: 0.6 10*3/uL (ref 0.1–1.0)
Neutro Abs: 3.5 10*3/uL (ref 1.4–7.7)
Neutrophils Relative %: 48.8 % (ref 43.0–77.0)
Platelets: 159 10*3/uL (ref 150.0–400.0)
RBC: 4.48 Mil/uL (ref 3.87–5.11)
RDW: 13.7 % (ref 11.5–15.5)
WBC: 7.1 10*3/uL (ref 4.0–10.5)

## 2017-10-02 LAB — TSH: TSH: 0.69 u[IU]/mL (ref 0.35–4.50)

## 2017-10-02 MED ORDER — ALPRAZOLAM 0.5 MG PO TABS: 0.5000 mg | ORAL_TABLET | Freq: Every day | ORAL | 0 refills | Status: DC | PRN

## 2017-10-02 NOTE — Assessment & Plan Note (Signed)
Increased anxiety recently secondary to closing on 2 houses, moving and her husband having dementia Was prescribed Xanax by GI for IBS and anxiety, which she has been taking as needed-in the last month she is only taking it 4 times She sometimes takes this during the day or other times will take it prior to bedtime She would like me to prescribe I advised I can prescribe, but she should only take as needed and should not take her to bedtime anymore because of her sleep medication-she understands and agrees not to take it at bedtime and to only take it during the day if needed Refilled today-dispense 30 pills

## 2017-10-02 NOTE — Assessment & Plan Note (Signed)
Check a1c Low sugar / carb diet Stressed regular exercise   

## 2017-10-02 NOTE — Progress Notes (Addendum)
Subjective:   [name] is a 84 y.o. female who presents for Medicare Annual (Subsequent) preventive examination.  Review of Systems:  No ROS.  Medicare Wellness Visit. Additional risk factors are reflected in the social history.  Cardiac Risk Factors include: advanced age (>73men, >44 women);dyslipidemia Sleep patterns: gets up 1 times nightly to void and sleeps 6-7 hours nightly.    Home Safety/Smoke Alarms: Feels safe in home. Smoke alarms in place.  Living environment; residence and Firearm Safety: apartment, no firearms. Resides at [facility], Lives with husband, no needs for DME, good support system Seat Belt Safety/Bike Helmet: Wears seat belt.      Objective:     Vitals: BP 132/86   Pulse (!) 59   Resp 18   Ht 5\' 4"  (1.626 m)   Wt 153 lb (69.4 kg)   SpO2 98%   BMI 26.26 kg/m   Body mass index is 26.26 kg/m.  Advanced Directives 10/02/2017 11/07/2015 02/21/2015 11/16/2014 11/14/2014 11/07/2014  Does Patient Have a Medical Advance Directive? Yes Yes Yes Yes Yes No  Type of Paramedic of [place];Living will [place];Living will - [place];Living will [place];Living will -  Does patient want to make changes to medical advance directive? - - - - No - Patient declined -  Copy of [place] in Chart? No - copy requested No - copy requested Yes - No - copy requested -  Would patient like information on creating a medical advance directive? - - - - - No - patient declined information    Tobacco Social History   Tobacco Use  Smoking Status Former Smoker  . Packs/day: 0.50  . Years: 30.00  . Pack years: 15.00  . Types: Cigarettes  Smokeless Tobacco Never Used  Tobacco Comment   quit smoking 1980     Counseling given: Not Answered Comment: quit smoking 1980   Past Medical History:  Diagnosis Date  . Aortic valve stenosis, severe   . Breast cancer ([place])      right  . Hiatal hernia   . Hypercholesteremia   . Hypoglycemia   . Osteopenia   . Ulcer 2012   bleeding gastric Ulcer   Past Surgical History:  Procedure Laterality Date  . AORTIC VALVE REPLACEMENT N/A 11/16/2014   Procedure: AORTIC VALVE REPLACEMENT (AVR);  Surgeon: Gaye Pollack, MD;  Location: [facility];  Service: Open Heart Surgery;  Laterality: N/A;  . BREAST SURGERY    . CARDIAC CATHETERIZATION N/A 10/18/2014   Procedure: Right/Left Heart Cath and Coronary Angiography;  Surgeon: [name], MD;  Location: [facility];  Service: Cardiovascular;  Laterality: N/A;  . COSMETIC SURGERY     face  . PARATHYROIDECTOMY    . TEE WITHOUT CARDIOVERSION N/A 11/16/2014   Procedure: TRANSESOPHAGEAL ECHOCARDIOGRAM (TEE);  Surgeon: Gaye Pollack, MD;  Location: [facility];  Service: Open Heart Surgery;  Laterality: N/A;  . TONSILLECTOMY     Family History  Problem Relation Age of Onset  . Hypertension Mother   . Rectal cancer Mother   . Colon cancer Mother   . Kidney disease Father    Social History   Socioeconomic History  . Marital status: Married    Spouse name: Not on file  . Number of children: 3  . Years of education: Not on file  . Highest education level: Not on file  Occupational History  . Not on file  Social Needs  . Financial resource strain: Not hard at all  . Food insecurity:    Worry: Never true    Inability: Never true  . Transportation needs:    Medical: No    Non-medical: No  Tobacco Use  . Smoking status: Former Smoker    Packs/day: 0.50    Years: 30.00    Pack years: 15.00    Types: Cigarettes  . Smokeless tobacco: Never Used  . Tobacco comment: quit smoking 1980  Substance and Sexual Activity  . Alcohol use: Yes    Alcohol/week: 4.2 oz    Types: 7 Glasses of Rudie Rikard per week  . Drug use: No  . Sexual activity: Not on file  Lifestyle  . Physical activity:    Days per week: 0 days    Minutes per session: 0 min  . Stress: Rather much   Relationships  . Social connections:    Talks on phone: More than three times a week    Gets together: More than three times a week    Attends religious service: Not on file    Active member of club or organization: Not on file    Attends meetings of clubs or organizations: Not on file    Relationship status: Married  Other Topics Concern  . Not on file  Social History Narrative  . Not on file    Outpatient Encounter Medications as of 10/02/2017  Medication Sig  . acetaminophen (TYLENOL) 500 MG tablet Take 500-1,000 mg by mouth at bedtime as needed for mild pain or moderate pain.  Marland Kitchen ALPRAZolam (XANAX) 0.5 MG tablet Take 1 tablet (0.5 mg total) by mouth daily as needed for anxiety. Do not take at night  . aspirin EC 81 MG tablet Take 1 tablet (81 mg total) by mouth daily.  . Calcium Carb-Cholecalciferol (CALCIUM 600+D3) 600-400 MG-UNIT TABS Take 1 tablet by mouth daily.  . Chloral Hydrate CRYS Take 10 mLs by mouth at bedtime as needed (Call  liquid into [place]).  . Cholecalciferol (VITAMIN D-3) 1000 UNITS CAPS Take 1 capsule by mouth daily.   . clindamycin (CLEOCIN) 300 MG capsule Take 600 mg by mouth as directed. Take 600 mg (2 capsules) by mouth one hour prior to dental procedures  . Coenzyme Q10 (CO Q 10 PO) Take 1 capsule by mouth daily.  . Magnesium 400 MG CAPS Take 400 mg by mouth every morning.   Marland Kitchen Ophthalmic Irrigation Solution (OCUSOFT EYE [place] OP) Apply 1 application to eye 2 (two) times daily as needed (dry eyes). Morning and before bed  . Polyethyl Glycol-Propyl Glycol (SYSTANE ULTRA PF) 0.4-0.3 % SOLN Apply 1 drop to eye 2 (two) times daily.  . Red Yeast Rice Extract ([employer] RED YEAST RICE PO) Take 1,200 mg by mouth daily.   . Wheat Dextrin (BENEFIBER DRINK MIX PO) Take 5 mLs by mouth every morning. Mix with 1/2 a glass water, and takes with vitamins   No facility-administered encounter medications on file as of 10/02/2017.     Activities of Daily  Living In your present state of health, do you have any difficulty performing the following activities: 10/02/2017  Hearing? N  Vision? N  Difficulty concentrating or making decisions? N  Walking or climbing stairs? N  Dressing or bathing? N  Doing errands, shopping? N  Preparing Food and eating ? N  Using the Toilet? N  In the past six months, have you accidently leaked urine? N  Do you have  problems with loss of bowel control? N  Managing your Medications? N  Managing your Finances? N  Housekeeping or managing your Housekeeping? N  Some recent data might be hidden    Patient Care Team: [name], MD as PCP - General (Internal Medicine) Pyrtle, Lajuan Lines, MD as Consulting Physician (Gastroenterology) [name], MD as Consulting Physician (Cardiology) Nicholas Lose, MD as Consulting Physician (Hematology and Oncology)    Assessment:   This is a routine wellness examination for [name]. Physical assessment deferred to PCP.   Exercise Activities and Dietary recommendations Current Exercise Habits: Home exercise routine, Type of exercise: walking, Time (Minutes): 30, Frequency (Times/Week): 5, Weekly Exercise (Minutes/Week): 150, Exercise limited by: None identified  Diet (meal preparation, eat out, water intake, caffeinated beverages, dairy products, fruits and vegetables): in general, a "healthy" diet  , well balanced, eats a variety of fruits and vegetables daily, limits salt, fat/cholesterol, sugar,carbohydrates,caffeine, drinks 6-8 glasses of water daily.  Goals      Patient Stated   . patient (pt-stated)     Will stay active      Other   . Patient Stated     Stay as healthy and as independent as possible. Continue to stay socially active, go to a balance class at [facility], continue to walk and be active.       Fall Risk Fall Risk  10/02/2017 09/29/2016 09/29/2016 02/21/2015 12/01/2013  Falls in the past year? No No No No No    Depression Screen PHQ 2/9 Scores  10/02/2017 09/29/2016 09/29/2016 03/14/2015  PHQ - 2 Score 1 0 0 0     Cognitive Function MMSE - Mini Mental State Exam 10/02/2017 02/21/2015  Not completed: - (No Data)  Orientation to time 5 -  Orientation to Place 5 -  Registration 3 -  Attention/ Calculation 5 -  Recall 2 -  Language- name 2 objects 2 -  Language- repeat 1 -  Language- follow 3 step command 3 -  Language- read & follow direction 1 -  Write a sentence 1 -  Copy design 1 -  Total score 29 -        Immunization History  Administered Date(s) Administered  . Influenza Split 01/29/2011, 02/10/2012  . Influenza Whole 01/27/2004, 02/02/2007, 02/08/2008, 02/09/2009, 01/16/2010  . Influenza, High Dose Seasonal PF 01/29/2016, 01/27/2017  . Influenza,inj,Quad PF,6+ Mos 01/18/2013, 01/26/2014, 01/23/2015  . Pneumococcal Conjugate-13 02/21/2015  . Pneumococcal Polysaccharide-23 04/28/2004  . Td 04/28/2005  . Tdap 09/29/2016  . Zoster 04/28/2009   Screening Tests Health Maintenance  Topic Date Due  . INFLUENZA VACCINE  11/26/2017  . DEXA SCAN  09/11/2018  . TETANUS/TDAP  09/30/2026  . PNA vac Low Risk Adult  Completed      Plan:  Continue doing brain stimulating activities (puzzles, reading, adult coloring books, staying active) to keep memory sharp.   Continue to eat heart healthy diet (full of fruits, vegetables, whole grains, lean protein, water--limit salt, fat, and sugar intake) and increase physical activity as tolerated.  I have personally reviewed and noted the following in the patient's chart:   . Medical and social history . Use of alcohol, tobacco or illicit drugs  . Current medications and supplements . Functional ability and status . Nutritional status . Physical activity . Advanced directives . List of other physicians . Vitals . Screenings to include cognitive, depression, and falls . Referrals and appointments  In addition, I have reviewed and discussed with patient certain preventive  protocols,  quality metrics, and best practice recommendations. A written personalized care plan for preventive services as well as general preventive health recommendations were provided to patient.     Michiel Cowboy, RN  10/02/2017   Medical screening examination/treatment/procedure(s) were performed by non-physician practitioner and as supervising physician I was immediately available for consultation/collaboration. I agree with above. [name], MD

## 2017-10-02 NOTE — Assessment & Plan Note (Signed)
Controlled, stable Continue current dose of medication  

## 2017-10-02 NOTE — Assessment & Plan Note (Signed)
Walking regularly and has increased her walking Taking calcium and vitamin D Not interested in medication Will monitor

## 2017-10-02 NOTE — Patient Instructions (Addendum)
Continue doing brain stimulating activities (puzzles, reading, adult coloring books, staying active) to keep memory sharp.   Continue to eat heart healthy diet (full of fruits, vegetables, whole grains, lean protein, water--limit salt, fat, and sugar intake) and increase physical activity as tolerated.   [name] , Thank you for taking time to come for your Medicare Wellness Visit. I appreciate your ongoing commitment to your health goals. Please review the following plan we discussed and let me know if I can assist you in the future.   These are the goals we discussed: Goals      Patient Stated   . patient (pt-stated)     Will stay active      Other   . Patient Stated     Stay as healthy and as independent as possible. Continue to stay socially active, go to a balance class at [facility], continue to walk and be active.       This is a list of the screening recommended for you and due dates:  Health Maintenance  Topic Date Due  . Flu Shot  11/26/2017  . DEXA scan (bone density measurement)  09/11/2018  . Tetanus Vaccine  09/30/2026  . Pneumonia vaccines  Completed      General Information  University Registrar's Office   [email or web address]  1.  2.  A person who is not currently enrolled at [facility] and wishes to audit a course should contact [employer] [phone]) for information about auditing courses. Students may register to audit coursesfrom the first day of classes to the "Last day to change course(s) or course section(s)" on the Academic Calendar.  Health Maintenance, Female Adopting a healthy lifestyle and getting preventive care can go a long way to promote health and wellness. Talk with your health care provider about what schedule of regular examinations is right for you. This is a good chance for you to check in with your provider about disease prevention and staying healthy. In between checkups, there are plenty of  things you can do on your own. Experts have done a lot of research about which lifestyle changes and preventive measures are most likely to keep you healthy. Ask your health care provider for more information. Weight and diet Eat a healthy diet  Be sure to include plenty of vegetables, fruits, low-fat dairy products, and lean protein.  Do not eat a lot of foods high in solid fats, added sugars, or salt.  Get regular exercise. This is one of the most important things you can do for your health. ? Most adults should exercise for at least 150 minutes each week. The exercise should increase your heart rate and make you sweat (moderate-intensity exercise). ? Most adults should also do strengthening exercises at least twice a week. This is in addition to the moderate-intensity exercise.  Maintain a healthy weight  Body mass index (BMI) is a measurement that can be used to identify possible weight problems. It estimates body fat based on height and weight. Your health care provider can help determine your BMI and help you achieve or maintain a healthy weight.  For females 5 years of age and older: ? A BMI below 18.5 is considered underweight. ? A BMI of 18.5 to 24.9 is normal. ? A BMI of 25 to 29.9 is considered overweight. ? A BMI of 30 and above is considered obese.  Watch levels of cholesterol and blood lipids  You should start having your blood tested for lipids and cholesterol at 82  years of age, then have this test every 5 years.  You may need to have your cholesterol levels checked more often if: ? Your lipid or cholesterol levels are high. ? You are older than 82 years of age. ? You are at high risk for heart disease.  Cancer screening Lung Cancer  Lung cancer screening is recommended for adults 60-25 years old who are at high risk for lung cancer because of a history of smoking.  A yearly low-dose CT scan of the lungs is recommended for people who: ? Currently smoke. ? Have  quit within the past 15 years. ? Have at least a 30-pack-year history of smoking. A pack year is smoking an average of one pack of cigarettes a day for 1 year.  Yearly screening should continue until it has been 15 years since you quit.  Yearly screening should stop if you develop a health problem that would prevent you from having lung cancer treatment.  Breast Cancer  Practice breast self-awareness. This means understanding how your breasts normally appear and feel.  It also means doing regular breast self-exams. Let your health care provider know about any changes, no matter how small.  If you are in your 20s or 30s, you should have a clinical breast exam (CBE) by a health care provider every 1-3 years as part of a regular health exam.  If you are 71 or older, have a CBE every year. Also consider having a breast X-ray (mammogram) every year.  If you have a family history of breast cancer, talk to your health care provider about genetic screening.  If you are at high risk for breast cancer, talk to your health care provider about having an MRI and a mammogram every year.  Breast cancer gene (BRCA) assessment is recommended for women who have family members with BRCA-related cancers. BRCA-related cancers include: ? Breast. ? Ovarian. ? Tubal. ? Peritoneal cancers.  Results of the assessment will determine the need for genetic counseling and BRCA1 and BRCA2 testing.  Cervical Cancer Your health care provider may recommend that you be screened regularly for cancer of the pelvic organs (ovaries, uterus, and vagina). This screening involves a pelvic examination, including checking for microscopic changes to the surface of your cervix (Pap test). You may be encouraged to have this screening done every 3 years, beginning at age 66.  For women ages 29-65, health care providers may recommend pelvic exams and Pap testing every 3 years, or they may recommend the Pap and pelvic exam, combined  with testing for human papilloma virus (HPV), every 5 years. Some types of HPV increase your risk of cervical cancer. Testing for HPV may also be done on women of any age with unclear Pap test results.  Other health care providers may not recommend any screening for nonpregnant women who are considered low risk for pelvic cancer and who do not have symptoms. Ask your health care provider if a screening pelvic exam is right for you.  If you have had past treatment for cervical cancer or a condition that could lead to cancer, you need Pap tests and screening for cancer for at least 20 years after your treatment. If Pap tests have been discontinued, your risk factors (such as having a new sexual partner) need to be reassessed to determine if screening should resume. Some women have medical problems that increase the chance of getting cervical cancer. In these cases, your health care provider may recommend more frequent screening and Pap tests.  Colorectal Cancer  This type of cancer can be detected and often prevented.  Routine colorectal cancer screening usually begins at 82 years of age and continues through 82 years of age.  Your health care provider may recommend screening at an earlier age if you have risk factors for colon cancer.  Your health care provider may also recommend using home test kits to check for hidden blood in the stool.  A small camera at the end of a tube can be used to examine your colon directly (sigmoidoscopy or colonoscopy). This is done to check for the earliest forms of colorectal cancer.  Routine screening usually begins at age 22.  Direct examination of the colon should be repeated every 5-10 years through 82 years of age. However, you may need to be screened more often if early forms of precancerous polyps or small growths are found.  Skin Cancer  Check your skin from head to toe regularly.  Tell your health care provider about any new moles or changes in moles,  especially if there is a change in a mole's shape or color.  Also tell your health care provider if you have a mole that is larger than the size of a pencil eraser.  Always use sunscreen. Apply sunscreen liberally and repeatedly throughout the day.  Protect yourself by wearing long sleeves, pants, a wide-brimmed hat, and sunglasses whenever you are outside.  Heart disease, diabetes, and high blood pressure  High blood pressure causes heart disease and increases the risk of stroke. High blood pressure is more likely to develop in: ? People who have blood pressure in the high end of the normal range (130-139/85-89 mm Hg). ? People who are overweight or obese. ? People who are African American.  If you are 50-9 years of age, have your blood pressure checked every 3-5 years. If you are 20 years of age or older, have your blood pressure checked every year. You should have your blood pressure measured twice-once when you are at a hospital or clinic, and once when you are not at a hospital or clinic. Record the average of the two measurements. To check your blood pressure when you are not at a hospital or clinic, you can use: ? An automated blood pressure machine at a pharmacy. ? A home blood pressure monitor.  If you are between 58 years and 30 years old, ask your health care provider if you should take aspirin to prevent strokes.  Have regular diabetes screenings. This involves taking a blood sample to check your fasting blood sugar level. ? If you are at a normal weight and have a low risk for diabetes, have this test once every three years after 82 years of age. ? If you are overweight and have a high risk for diabetes, consider being tested at a younger age or more often. Preventing infection Hepatitis B  If you have a higher risk for hepatitis B, you should be screened for this virus. You are considered at high risk for hepatitis B if: ? You were born in a country where hepatitis B is  common. Ask your health care provider which countries are considered high risk. ? Your parents were born in a high-risk country, and you have not been immunized against hepatitis B (hepatitis B vaccine). ? You have HIV or AIDS. ? You use needles to inject street drugs. ? You live with someone who has hepatitis B. ? You have had sex with someone who has hepatitis B. ?  You get hemodialysis treatment. ? You take certain medicines for conditions, including cancer, organ transplantation, and autoimmune conditions.  Hepatitis C  Blood testing is recommended for: ? Everyone born from [birth year] through 1963. ? Anyone with known risk factors for hepatitis C.  Sexually transmitted infections (STIs)  You should be screened for sexually transmitted infections (STIs) including gonorrhea and chlamydia if: ? You are sexually active and are younger than 82 years of age. ? You are older than 82 years of age and your health care provider tells you that you are at risk for this type of infection. ? Your sexual activity has changed since you were last screened and you are at an increased risk for chlamydia or gonorrhea. Ask your health care provider if you are at risk.  If you do not have HIV, but are at risk, it may be recommended that you take a prescription medicine daily to prevent HIV infection. This is called pre-exposure prophylaxis (PrEP). You are considered at risk if: ? You are sexually active and do not regularly use condoms or know the HIV status of your partner(s). ? You take drugs by injection. ? You are sexually active with a partner who has HIV.  Talk with your health care provider about whether you are at high risk of being infected with HIV. If you choose to begin PrEP, you should first be tested for HIV. You should then be tested every 3 months for as long as you are taking PrEP. Pregnancy  If you are premenopausal and you may become pregnant, ask your health care provider about preconception  counseling.  If you may become pregnant, take 400 to 800 micrograms (mcg) of folic acid every day.  If you want to prevent pregnancy, talk to your health care provider about birth control (contraception). Osteoporosis and menopause  Osteoporosis is a disease in which the bones lose minerals and strength with aging. This can result in serious bone fractures. Your risk for osteoporosis can be identified using a bone density scan.  If you are 16 years of age or older, or if you are at risk for osteoporosis and fractures, ask your health care provider if you should be screened.  Ask your health care provider whether you should take a calcium or vitamin D supplement to lower your risk for osteoporosis.  Menopause may have certain physical symptoms and risks.  Hormone replacement therapy may reduce some of these symptoms and risks. Talk to your health care provider about whether hormone replacement therapy is right for you. Follow these instructions at home:  Schedule regular health, dental, and eye exams.  Stay current with your immunizations.  Do not use any tobacco products including cigarettes, chewing tobacco, or electronic cigarettes.  If you are pregnant, do not drink alcohol.  If you are breastfeeding, limit how much and how often you drink alcohol.  Limit alcohol intake to no more than 1 drink per day for nonpregnant women. One drink equals 12 ounces of beer, 5 ounces of Nakema Fake, or 1 ounces of hard liquor.  Do not use street drugs.  Do not share needles.  Ask your health care provider for help if you need support or information about quitting drugs.  Tell your health care provider if you often feel depressed.  Tell your health care provider if you have ever been abused or do not feel safe at home. This information is not intended to replace advice given to you by your health care provider. Make sure you  discuss any questions you have with your health care provider. Document  Released: 10/28/2010 Document Revised: 09/20/2015 Document Reviewed: 01/16/2015 Elsevier Interactive Patient Education  [employer].

## 2017-10-02 NOTE — Assessment & Plan Note (Signed)
Taking 1200 mg red yeast rice extract familial Check lipid panel  Regular exercise and healthy diet encouraged

## 2017-10-03 ENCOUNTER — Encounter: Payer: Self-pay | Admitting: Internal Medicine

## 2017-10-04 ENCOUNTER — Encounter: Payer: Self-pay | Admitting: Internal Medicine

## 2017-10-07 ENCOUNTER — Encounter: Payer: Self-pay | Admitting: Internal Medicine

## 2017-10-12 ENCOUNTER — Ambulatory Visit: Payer: Self-pay | Admitting: *Deleted

## 2017-10-12 ENCOUNTER — Encounter: Payer: Self-pay | Admitting: Internal Medicine

## 2017-10-12 NOTE — Telephone Encounter (Signed)
Tried to call patient to schedule appointment with Dr Quay Burow. No answer.

## 2017-10-12 NOTE — Telephone Encounter (Signed)
[name]  Has had 2 episodes of tingling on the left side including arm, hand and leg. She was walking and standing at the time of occurrence. Each time it lasted less than one minute and resolved on its own. She denies all other symptoms with the events. She reports having headaches on/off over the last 4 months which she described this time period as very stressful as she has moved from [place], then from her [place] home and in to [place]. No availability with Dr. Quay Burow today, offered other physicians today-refused. Scheduled appointment with Dr. Quay Burow Tuesday morning. Reviewed s/sx that would require immediate tx. Stated understanding.   Reason for Disposition . [1] Tingling (e.g., pins and needles) of the face, arm / hand, or leg / foot on one side of the body AND [2] present now  Answer Assessment - Initial Assessment Questions 1. SYMPTOM: "What is the main symptom you are concerned about?" (e.g., weakness, numbness)     Tingling in Left leg, arm and hand 2. ONSET: "When did this start?" (minutes, hours, days; while sleeping)     Within the last week, last time was last night 3. LAST NORMAL: "When was the last time you were normal (no symptoms)?"    This morning 4. PATTERN "Does this come and go, or has it been constant since it started?"  "Is it present now?"    Comes and goes 2 times now, last less than one minute and resolves.  5. CARDIAC SYMPTOMS: "Have you had any of the following symptoms: chest pain, difficulty breathing, palpitations?"     no 6. NEUROLOGIC SYMPTOMS: "Have you had any of the following symptoms: headache, dizziness, vision loss, double vision, changes in speech, unsteady on your feet?"     Unsteady on feet. 7. OTHER SYMPTOMS: "Do you have any other symptoms?"     no 8. PREGNANCY: "Is there any chance you are pregnant?" "When was your last menstrual period?"     no  Protocols used: NEUROLOGIC DEFICIT-A-AH

## 2017-10-12 NOTE — Progress Notes (Signed)
Subjective:    Patient ID: [name], female    DOB: 19-Feb-1933, 84 y.o.   MRN: [id number]  HPI The patient is here for an acute visit for left hand and arm and leg tingling   Thursday night, 5 days ago, she was taking a walk she had a tingling and coolness sensation down her left arm and tingling in her left leg to her foot.  Her left leg felt week and she felt like she was going to fall.  She held onto her husband and walked fine.  The symptoms resolved after 2-3 minutes.  She continued to walk.  She had another 2-3 episodes over the next 3 days.  Her symptoms were the same.  Her last episode was yesterday morning.  Right now she can feel some tingling in her left hand.  She denies any weakness today.  She denies any difficulty with speech or swallowing.  She denies changes in her vision.   She wonders if anxiety may cause it - one of those times it occurred in the setting of anxiety.  She has been more anxious in the past several months.  She takes her ASA 81 mg daily.  She is not on any BP medication or cholesterol lowering medication.  She is exercising regularly.        Medications and allergies reviewed with patient and updated if appropriate.  Patient Active Problem List   Diagnosis Date Noted  . Anxiety 10/02/2017  . Right ear pain 09/04/2017  . Prediabetes 09/29/2016  . Lung nodule < 6cm on CT 09/29/2016  . Leg cramps 01/29/2016  . Insomnia 09/12/2015  . Rectal bleed 09/12/2015  . S/P AVR 11/16/2014  . Branch retinal vein occlusion 10/12/2013  . Aortic valve stenosis, severe   . ALLERGIC RHINITIS DUE TO OTHER ALLERGEN 09/10/2009  . Hyperlipidemia 11/30/2006  . HYPOGLYCEMIA NEC 11/20/2006  . MITRAL VALVE PROLAPSE 10/29/2006  . Osteopenia 10/29/2006  . History of right breast cancer 10/29/2006    Current Outpatient Medications on File Prior to Visit  Medication Sig Dispense Refill  . acetaminophen (TYLENOL) 500 MG tablet Take 500-1,000 mg by mouth at  bedtime as needed for mild pain or moderate pain.    Marland Kitchen ALPRAZolam (XANAX) 0.5 MG tablet Take 1 tablet (0.5 mg total) by mouth daily as needed for anxiety. Do not take at night 30 tablet 0  . aspirin EC 81 MG tablet Take 1 tablet (81 mg total) by mouth daily.    . Calcium Carb-Cholecalciferol (CALCIUM 600+D3) 600-400 MG-UNIT TABS Take 1 tablet by mouth daily.    . Chloral Hydrate CRYS Take 10 mLs by mouth at bedtime as needed (Call  liquid into [facility]). 900 g 1  . Cholecalciferol (VITAMIN D-3) 1000 UNITS CAPS Take 1 capsule by mouth daily.     . clindamycin (CLEOCIN) 300 MG capsule Take 600 mg by mouth as directed. Take 600 mg (2 capsules) by mouth one hour prior to dental procedures    . Coenzyme Q10 (CO Q 10 PO) Take 1 capsule by mouth daily.    . Magnesium 400 MG CAPS Take 400 mg by mouth every morning.     Marland Kitchen Ophthalmic Irrigation Solution (OCUSOFT EYE [facility] OP) Apply 1 application to eye 2 (two) times daily as needed (dry eyes). Morning and before bed    . Polyethyl Glycol-Propyl Glycol (SYSTANE ULTRA PF) 0.4-0.3 % SOLN Apply 1 drop to eye 2 (two) times daily.    Marland Kitchen  Wheat Dextrin (BENEFIBER DRINK MIX PO) Take 5 mLs by mouth every morning. Mix with 1/2 a glass water, and takes with vitamins     No current facility-administered medications on file prior to visit.     Past Medical History:  Diagnosis Date  . Aortic valve stenosis, severe   . Breast cancer ([facility])    right  . Hiatal hernia   . Hypercholesteremia   . Hypoglycemia   . Osteopenia   . Ulcer 2012   bleeding gastric Ulcer    Past Surgical History:  Procedure Laterality Date  . AORTIC VALVE REPLACEMENT N/A 11/16/2014   Procedure: AORTIC VALVE REPLACEMENT (AVR);  Surgeon: Gaye Pollack, MD;  Location: [facility];  Service: Open Heart Surgery;  Laterality: N/A;  . BREAST SURGERY    . CARDIAC CATHETERIZATION N/A 10/18/2014   Procedure: Right/Left Heart Cath and Coronary Angiography;  Surgeon: [name], MD;   Location: [facility];  Service: Cardiovascular;  Laterality: N/A;  . COSMETIC SURGERY     face  . PARATHYROIDECTOMY    . TEE WITHOUT CARDIOVERSION N/A 11/16/2014   Procedure: TRANSESOPHAGEAL ECHOCARDIOGRAM (TEE);  Surgeon: Gaye Pollack, MD;  Location: [facility];  Service: Open Heart Surgery;  Laterality: N/A;  . TONSILLECTOMY      Social History   Socioeconomic History  . Marital status: Married    Spouse name: Not on file  . Number of children: 3  . Years of education: Not on file  . Highest education level: Not on file  Occupational History  . Not on file  Social Needs  . Financial resource strain: Not hard at all  . Food insecurity:    Worry: Never true    Inability: Never true  . Transportation needs:    Medical: No    Non-medical: No  Tobacco Use  . Smoking status: Former Smoker    Packs/day: 0.50    Years: 30.00    Pack years: 15.00    Types: Cigarettes  . Smokeless tobacco: Never Used  . Tobacco comment: quit smoking 1980  Substance and Sexual Activity  . Alcohol use: Yes    Alcohol/week: 4.2 oz    Types: 7 Glasses of wine per week  . Drug use: No  . Sexual activity: Not on file  Lifestyle  . Physical activity:    Days per week: 0 days    Minutes per session: 0 min  . Stress: Rather much  Relationships  . Social connections:    Talks on phone: More than three times a week    Gets together: More than three times a week    Attends religious service: Not on file    Active member of club or organization: Not on file    Attends meetings of clubs or organizations: Not on file    Relationship status: Married  Other Topics Concern  . Not on file  Social History Narrative  . Not on file    Family History  Problem Relation Age of Onset  . Hypertension Mother   . Rectal cancer Mother   . Colon cancer Mother   . Kidney disease Father     Review of Systems  Constitutional: Negative for fever.  HENT: Negative for trouble swallowing.   Eyes:  Negative for visual disturbance.  Respiratory: Negative for shortness of breath.   Cardiovascular: Negative for chest pain, palpitations and leg swelling.  Gastrointestinal: Negative for nausea.  Neurological: Positive for weakness (left leg), numbness (tingling left arm  and leg - episodic) and headaches (intermittent). Negative for dizziness, speech difficulty and light-headedness.       Objective:   Vitals:   10/13/17 1002  BP: (!) 154/78  Pulse: 73  Resp: 16  Temp: (!) 97.5 F (36.4 C)  SpO2: 98%   BP Readings from Last 3 Encounters:  10/13/17 (!) 154/78  10/02/17 132/86  10/02/17 132/86   Wt Readings from Last 3 Encounters:  10/13/17 155 lb (70.3 kg)  10/02/17 153 lb (69.4 kg)  10/02/17 153 lb (69.4 kg)   Body mass index is 26.61 kg/m.   Physical Exam  Constitutional: She is oriented to person, place, and time. She appears well-developed and well-nourished. No distress.  HENT:  Head: Normocephalic and atraumatic.  Eyes: EOM are normal.  Neck: Normal range of motion. Neck supple. No tracheal deviation present. No thyromegaly present.  Cardiovascular: Normal rate and regular rhythm.  Murmur (2/6 systolic) heard. Pulmonary/Chest: Effort normal and breath sounds normal. No respiratory distress. She has no wheezes. She has no rales.  Musculoskeletal: She exhibits no edema.  Lymphadenopathy:    She has no cervical adenopathy.  Neurological: She is alert and oriented to person, place, and time. A sensory deficit (left leg feels different than right leg; tingling in left hand) is present. No cranial nerve deficit. She exhibits normal muscle tone. Coordination normal.  Normal strength in b/l UE's and LE's  Skin: Skin is warm and dry. She is not diaphoretic.           Assessment & Plan:    See Problem List for Assessment and Plan of chronic medical problems.

## 2017-10-13 ENCOUNTER — Encounter: Payer: Self-pay | Admitting: Internal Medicine

## 2017-10-13 ENCOUNTER — Ambulatory Visit (INDEPENDENT_AMBULATORY_CARE_PROVIDER_SITE_OTHER): Payer: Medicare Other | Admitting: Internal Medicine

## 2017-10-13 DIAGNOSIS — R200 Anesthesia of skin: Secondary | ICD-10-CM | POA: Diagnosis not present

## 2017-10-13 DIAGNOSIS — R202 Paresthesia of skin: Secondary | ICD-10-CM

## 2017-10-13 MED ORDER — AMLODIPINE BESYLATE 2.5 MG PO TABS
2.5000 mg | ORAL_TABLET | Freq: Every day | ORAL | 3 refills | Status: DC
Start: 2017-10-13 — End: 2017-12-15

## 2017-10-13 NOTE — Patient Instructions (Addendum)
  Start taking aspirin 325 mg daily for now.   Start amlodipine 2.5 mg daily.    Your prescription(s) have been submitted to your pharmacy. Please take as directed and contact our office if you believe you are having problem(s) with the medication(s).  A referral was ordered for neurology   An MRI of your brain was ordered.  Someone will call you to schedule.

## 2017-10-13 NOTE — Assessment & Plan Note (Addendum)
Episodic over the past week Tingling in left arm and leg associated with left leg weakness Has different sensation in LLE now and some tingling in left hand, otherwise neuro exam is normal - no weakness on exam Concern for TIA vs stroke, could be stress induced, but less likely MRI of brain Referral to neuro Start amlodipine 2.5 mg daily Increase ASA to 325 mg daily for now Will need to consider cholesterol lowering medication, but will see what MRI shows Advised to go to ED if symptoms recur and do not resolve quickly or if she has any other concerning symptoms

## 2017-10-14 ENCOUNTER — Encounter: Payer: Self-pay | Admitting: Internal Medicine

## 2017-10-14 ENCOUNTER — Ambulatory Visit
Admission: RE | Admit: 2017-10-14 | Discharge: 2017-10-14 | Disposition: A | Discharge status: Home / Self Care | Payer: Medicare Other | Source: Ambulatory Visit | Attending: Internal Medicine | Admitting: Internal Medicine

## 2017-10-14 DIAGNOSIS — R202 Paresthesia of skin: Principal | ICD-10-CM

## 2017-10-14 DIAGNOSIS — R200 Anesthesia of skin: Secondary | ICD-10-CM

## 2017-10-15 ENCOUNTER — Other Ambulatory Visit: Payer: Self-pay | Admitting: Emergency Medicine

## 2017-10-15 MED ORDER — ROSUVASTATIN CALCIUM 10 MG PO TABS: 10.0000 mg | ORAL_TABLET | Freq: Every day | ORAL | 1 refills | Status: DC

## 2017-10-15 NOTE — Telephone Encounter (Signed)
Please call patient back with xray results.

## 2017-10-15 NOTE — Telephone Encounter (Signed)
Spoke with pt to inform.  

## 2017-10-19 ENCOUNTER — Ambulatory Visit (INDEPENDENT_AMBULATORY_CARE_PROVIDER_SITE_OTHER): Payer: Medicare Other | Admitting: Neurology

## 2017-10-19 ENCOUNTER — Encounter: Payer: Self-pay | Admitting: Neurology

## 2017-10-19 VITALS — BP 107/64 | HR 62 | Ht 64.0 in | Wt 155.5 lb

## 2017-10-19 DIAGNOSIS — I679 Cerebrovascular disease, unspecified: Secondary | ICD-10-CM | POA: Diagnosis not present

## 2017-10-19 DIAGNOSIS — R200 Anesthesia of skin: Secondary | ICD-10-CM

## 2017-10-19 NOTE — Progress Notes (Signed)
PATIENT: [name] DOB: 29-Oct-1932  Chief Complaint  Patient presents with  . Numbness    Reports intermittent tingling in her right arm and leg that started 1.5 weeks ago.  She has been having increased stress recently, having to move twice in four months.  She now lives in [place] with her husband (recolated from [place]).  She would like to review her recent brain MRI.  Marland Kitchen PCP    [name], MD     HISTORICAL  [name] is a 84 years old female, seen in refer by her primary care physician Dr. Quay Burow, [facility] for evaluation of numbness, initial evaluation was on October 19, 2017,  She has past medical history of hypertension, hyperlipidemia, and recently moved back from [place] to [place], lived [facility], she continued to play bridge few times each week,  She reported few episode of sudden onset paresthesia, initial episode was on October 08, 2017, she was walking with her husband, sudden onset of left arm and leg numbness lasting for few minutes, noticed mild left leg weakness, she has to lean on her husband for some help, but was able to continue finish her walking for 30 minutes,  Second episode was on October 11 2017, sudden onset of left arm numbness last for couple minutes, denies weakness,  She reported excessive stress with her recent move, and also her husband has mild memory loss, she felt very nervous coming to doctor's appointment, has been taking Xanax as needed to relax and sleep, she is also taking aspirin 81 mg daily,  She had a history of left breast cancer, status post left lobectomy, did have left great patient.  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 Amoxicillin-Pot Clavulanate Hives and Rash  . Penicillin G Rash  . Penicillins Hives and Rash  . Sulfa Antibiotics Rash  . Sulfamethoxazole Rash  . Sulfonamide Derivatives Hives    HOME MEDICATIONS: Current Outpatient Medications  Medication Sig Dispense Refill  . acetaminophen (TYLENOL) 500 MG tablet Take 500-1,000 mg by mouth at bedtime as needed for mild pain or moderate pain.    Marland Kitchen ALPRAZolam (XANAX) 0.5 MG tablet Take 1 tablet (0.5 mg total) by mouth daily as needed for anxiety. Do not take at night 30 tablet 0  . amLODipine (NORVASC) 2.5 MG tablet Take 1 tablet (2.5 mg total) by mouth daily. 90 tablet 3  . aspirin EC 81 MG tablet Take 1 tablet (81 mg total) by mouth daily. (Patient taking differently: 81 mg. Taking four tablets daily.)    . Calcium Carb-Cholecalciferol (CALCIUM 600+D3) 600-400 MG-UNIT TABS Take 1 tablet by mouth daily.    . Chloral Hydrate CRYS Take 10 mLs by mouth at bedtime as needed (Call  liquid into [facility]). 900 g 1  .  Cholecalciferol (VITAMIN D-3) 1000 UNITS CAPS Take 1 capsule by mouth daily.     . clindamycin (CLEOCIN) 300 MG capsule Take 600 mg by mouth as directed. Take 600 mg (2 capsules) by mouth one hour prior to dental procedures    . Coenzyme Q10 (CO Q 10 PO) Take 1 capsule by mouth daily.    . Magnesium 400 MG CAPS Take 400 mg by mouth every morning.     Marland Kitchen Ophthalmic Irrigation Solution (OCUSOFT EYE [facility] OP) Apply 1 application to eye 2 (two) times daily as needed (dry eyes). Morning and before bed    .  Polyethyl Glycol-Propyl Glycol (SYSTANE ULTRA PF) 0.4-0.3 % SOLN Apply 1 drop to eye 2 (two) times daily.    . rosuvastatin (CRESTOR) 10 MG tablet Take 1 tablet (10 mg total) by mouth daily. 90 tablet 1  . Wheat Dextrin (BENEFIBER DRINK MIX PO) Take 5 mLs by mouth every morning. Mix with 1/2 a glass water, and takes with vitamins     No current facility-administered medications for this visit.     PAST MEDICAL HISTORY: Past Medical History:  Diagnosis Date  . Aortic valve stenosis, severe   . Breast cancer ([facility])    right  . Hiatal hernia   . Hypercholesteremia   . Hypoglycemia   . Osteopenia   . Tingling   . Ulcer 2012   bleeding gastric Ulcer    PAST SURGICAL HISTORY: Past Surgical History:  Procedure Laterality Date  . AORTIC VALVE REPLACEMENT N/A 11/16/2014   Procedure: AORTIC VALVE REPLACEMENT (AVR);  Surgeon: Gaye Pollack, MD;  Location: [facility];  Service: Open Heart Surgery;  Laterality: N/A;  . BREAST SURGERY    . CARDIAC CATHETERIZATION N/A 10/18/2014   Procedure: Right/Left Heart Cath and Coronary Angiography;  Surgeon: [name], MD;  Location: [facility];  Service: Cardiovascular;  Laterality: N/A;  . COSMETIC SURGERY     face  . PARATHYROIDECTOMY    . TEE WITHOUT CARDIOVERSION N/A 11/16/2014   Procedure: TRANSESOPHAGEAL ECHOCARDIOGRAM (TEE);  Surgeon: Gaye Pollack, MD;  Location: [facility];  Service: Open Heart Surgery;  Laterality: N/A;  . TONSILLECTOMY      FAMILY HISTORY: Family History  Problem Relation Age of Onset  . Hypertension Mother   . Rectal cancer Mother   . Colon cancer Mother   . Kidney disease Father     SOCIAL HISTORY:  Social History   Socioeconomic History  . Marital status: Married    Spouse name: Not on file  . Number of children: 3  . Years of education: 16  . Highest education level: Bachelor's degree (e.g., BA, AB, BS)  Occupational History  . Occupation: Retired  Scientific laboratory technician  . Financial resource strain: Not  hard at all  . Food insecurity:    Worry: Never true    Inability: Never true  . Transportation needs:    Medical: No    Non-medical: No  Tobacco Use  . Smoking status: Former Smoker    Packs/day: 0.50    Years: 30.00    Pack years: 15.00    Types: Cigarettes  . Smokeless tobacco: Never Used  . Tobacco comment: quit smoking 1980  Substance and Sexual Activity  . Alcohol use: Yes    Alcohol/week: 4.2 oz    Types: 7 Glasses of wine per week    Comment: social  . Drug use: No  . Sexual activity: Not on file  Lifestyle  . Physical activity:  Days per week: 0 days    Minutes per session: 0 min  . Stress: Rather much  Relationships  . Social connections:    Talks on phone: More than three times a week    Gets together: More than three times a week    Attends religious service: Not on file    Active member of club or organization: Not on file    Attends meetings of clubs or organizations: Not on file    Relationship status: Married  . Intimate partner violence:    Fear of current or ex partner: No    Emotionally abused: No    Physically abused: No    Forced sexual activity: No  Other Topics Concern  . Not on file  Social History Narrative   Lives at [facility] with her husband.   Right-handed.   Caffeine use: 3-4 cups per day (some tea, mixes coffee with decaf).     PHYSICAL EXAM   Vitals:   10/19/17 1336  BP: 107/64  Pulse: 62  Weight: 155 lb 8 oz (70.5 kg)  Height: 5\' 4"  (1.626 m)    Not recorded      Body mass index is 26.69 kg/m.  PHYSICAL EXAMNIATION:  Gen: NAD, conversant, well nourised, obese, well groomed                     Cardiovascular: Regular rate rhythm, no peripheral edema, warm, nontender. Eyes: Conjunctivae clear without exudates or hemorrhage Neck: Supple, no carotid bruits. Pulmonary: Clear to auscultation bilaterally   NEUROLOGICAL EXAM:  MENTAL STATUS: Speech:    Speech is normal; fluent and spontaneous with normal  comprehension.  Cognition:     Orientation to time, place and person     Normal recent and remote memory     Normal Attention span and concentration     Normal Language, naming, repeating,spontaneous speech     Fund of knowledge   CRANIAL NERVES: CN II: Visual fields are full to confrontation. Fundoscopic exam is normal with sharp discs and no vascular changes. Pupils are round equal and briskly reactive to light. CN III, IV, VI: extraocular movement are normal. No ptosis. CN V: Facial sensation is intact to pinprick in all 3 divisions bilaterally. Corneal responses are intact.  CN VII: Face is symmetric with normal eye closure and smile. CN VIII: Hearing is normal to rubbing fingers CN IX, X: Palate elevates symmetrically. Phonation is normal. CN XI: Head turning and shoulder shrug are intact CN XII: Tongue is midline with normal movements and no atrophy.  MOTOR: There is no pronator drift of out-stretched arms. Muscle bulk and tone are normal. Muscle strength is normal.  REFLEXES: Reflexes are 2+ and symmetric at the biceps, triceps, knees, and ankles. Plantar responses are flexor.  SENSORY: Intact to light touch, pinprick, positional sensation and vibratory sensation are intact in fingers and toes.  COORDINATION: Rapid alternating movements and fine finger movements are intact. There is no dysmetria on finger-to-nose and heel-knee-shin.    GAIT/STANCE: Posture is normal. Gait is steady with normal steps, base, arm swing, and turning. Heel and toe walking are normal. Tandem gait is normal.  Romberg is absent.   DIAGNOSTIC DATA (LABS, IMAGING, TESTING) - I reviewed patient records, labs, notes, testing and imaging myself where available.   ASSESSMENT AND PLAN  [name] is a 84 y.o. female   Transient left upper extremity paresthesia  TIA, versus stress related  MRI of the brain showed  no acute abnormality, there is evidence of supratentorium and brain stem small  vessel disease  She has vascular risk factor of hypertension, hyperlipidemia  Continue aspirin daily  Complete evaluation with ultrasound of carotid   Marcial Pacas, M.D. Ph.D.  [facility] [address] [place],  [address] Ph: [phone] Fax: [phone]  CC:  [name], MD

## 2017-10-19 NOTE — Patient Instructions (Signed)
Keep ASA 81mg  daily,   Increase water intake

## 2017-10-29 ENCOUNTER — Ambulatory Visit (HOSPITAL_COMMUNITY): Payer: Medicare Other

## 2017-10-30 ENCOUNTER — Ambulatory Visit (HOSPITAL_COMMUNITY)
Admission: RE | Admit: 2017-10-30 | Discharge: 2017-10-30 | Disposition: A | Discharge status: Home / Self Care | Payer: Medicare Other | Source: Ambulatory Visit | Attending: Neurology | Admitting: Neurology

## 2017-10-30 DIAGNOSIS — E785 Hyperlipidemia, unspecified: Secondary | ICD-10-CM | POA: Insufficient documentation

## 2017-10-30 DIAGNOSIS — Z951 Presence of aortocoronary bypass graft: Secondary | ICD-10-CM | POA: Insufficient documentation

## 2017-10-30 DIAGNOSIS — R200 Anesthesia of skin: Secondary | ICD-10-CM

## 2017-10-30 DIAGNOSIS — I10 Essential (primary) hypertension: Secondary | ICD-10-CM | POA: Diagnosis not present

## 2017-10-30 DIAGNOSIS — I679 Cerebrovascular disease, unspecified: Secondary | ICD-10-CM | POA: Insufficient documentation

## 2017-10-30 NOTE — Progress Notes (Signed)
Bilateral carotid duplex completed. 1% to 39% ICA stenosis. Vertebral artery flow antegrade. [facility], [place] 10/30/2017 12:05 PM

## 2017-11-02 ENCOUNTER — Telehealth: Payer: Self-pay | Admitting: Neurology

## 2017-11-02 NOTE — Telephone Encounter (Signed)
Please call patient, ultrasound of bilateral carotid artery showed less than 39% stenosis.  Final Interpretation: Right Carotid: Velocities in the right ICA are consistent with a 1-39% stenosis.  Left Carotid: Velocities in the left ICA are consistent with a 1-39% stenosis.  Vertebrals: Bilateral vertebral arteries demonstrate antegrade flow. Subclavians: Normal flow hemodynamics were seen in bilateral subclavian       arteries.  *See table(s) above for measurements and observations.

## 2017-11-02 NOTE — Telephone Encounter (Signed)
Spoke to patient - she is aware of results and verbalized understanding.

## 2017-11-11 ENCOUNTER — Encounter: Payer: Self-pay | Admitting: Internal Medicine

## 2017-11-12 MED ORDER — ALPRAZOLAM 0.5 MG PO TABS: 0.5000 mg | ORAL_TABLET | Freq: Every day | ORAL | 0 refills | Status: DC | PRN

## 2017-11-12 NOTE — Telephone Encounter (Signed)
[place] Controlled Substance Database checked. Last filled on 10/02/17

## 2017-11-13 ENCOUNTER — Inpatient Hospital Stay: Payer: Medicare Other | Attending: Adult Health | Admitting: Adult Health

## 2017-11-13 ENCOUNTER — Telehealth: Payer: Self-pay | Admitting: Adult Health

## 2017-11-13 ENCOUNTER — Encounter: Payer: Self-pay | Admitting: Adult Health

## 2017-11-13 VITALS — BP 129/56 | HR 59 | Temp 97.7°F | Resp 18 | Ht 64.0 in | Wt 155.8 lb

## 2017-11-13 DIAGNOSIS — Z79899 Other long term (current) drug therapy: Secondary | ICD-10-CM | POA: Insufficient documentation

## 2017-11-13 DIAGNOSIS — Z87891 Personal history of nicotine dependence: Secondary | ICD-10-CM | POA: Diagnosis not present

## 2017-11-13 DIAGNOSIS — R200 Anesthesia of skin: Secondary | ICD-10-CM | POA: Diagnosis not present

## 2017-11-13 DIAGNOSIS — Z170 Estrogen receptor positive status [ER+]: Secondary | ICD-10-CM | POA: Insufficient documentation

## 2017-11-13 DIAGNOSIS — E7800 Pure hypercholesterolemia, unspecified: Secondary | ICD-10-CM | POA: Diagnosis not present

## 2017-11-13 DIAGNOSIS — Z7982 Long term (current) use of aspirin: Secondary | ICD-10-CM | POA: Insufficient documentation

## 2017-11-13 DIAGNOSIS — Z853 Personal history of malignant neoplasm of breast: Secondary | ICD-10-CM | POA: Diagnosis not present

## 2017-11-13 DIAGNOSIS — Z9223 Personal history of estrogen therapy: Secondary | ICD-10-CM | POA: Diagnosis not present

## 2017-11-13 DIAGNOSIS — Z800 Family history of malignant neoplasm of digestive organs: Secondary | ICD-10-CM | POA: Diagnosis not present

## 2017-11-13 DIAGNOSIS — M8580 Other specified disorders of bone density and structure, unspecified site: Secondary | ICD-10-CM | POA: Insufficient documentation

## 2017-11-13 NOTE — Progress Notes (Signed)
CLINIC:  Survivorship   REASON FOR VISIT:  Routine follow-up for history of breast cancer.   BRIEF ONCOLOGIC HISTORY:    History of right breast cancer   02/16/2004 Initial Diagnosis    Right breast mass biopsy: Invasive mammary cancer, MRI revealed 1.3 cm mass upper inner quadrant right breast      03/05/2004 Surgery    Right lumpectomy: 0.9 cm grade 1 invasive lobular cancer, ER positive, PR negative, HER-2 negative, Ki-67 5%, sentinel lymph nodes negative      04/08/2004 - 05/09/2004 Radiation Therapy    Adjuvant radiation therapy      06/10/2004 - 06/09/2009 Anti-estrogen oral therapy    MA-27 trial and placed on the Aromasin arm. She completed one year of Aromasin, and it was discontinued due to severe joint aches and pains, followed by Tamoxifen for 2-3 years, and then Letrozole to complete out a total of 5 years        INTERVAL HISTORY:  Ms. Pallett presents to the [facility] today for routine follow-up for her history of breast cancer.  Overall, she reports feeling quite well. She does have some numbness that she has been following up with Dr. Krista Blue about.  She has undergone MRI brain and carotid ultrasound.  She has moved to a retirement community and has been walking and exercising there.  She continues to f/u with Dr. Quay Burow.      REVIEW OF SYSTEMS:  Review of Systems  Constitutional: Negative for appetite change, chills, fatigue and fever.  HENT:   Negative for hearing loss, lump/mass, sore throat and trouble swallowing.   Eyes: Negative for eye problems and icterus.  Respiratory: Negative for chest tightness, cough and shortness of breath.   Cardiovascular: Negative for chest pain, leg swelling and palpitations.  Gastrointestinal: Negative for abdominal distention, abdominal pain, constipation, diarrhea, nausea and vomiting.  Endocrine: Negative for hot flashes.  Skin: Negative for itching and rash.  Neurological: Negative for dizziness, extremity  weakness, headaches and numbness.  Hematological: Negative for adenopathy. Does not bruise/bleed easily.  Psychiatric/Behavioral: Negative for depression. The patient is not nervous/anxious.   Breast: Denies any new nodularity, masses, tenderness, nipple changes, or nipple discharge.       PAST MEDICAL/SURGICAL HISTORY:  Past Medical History:  Diagnosis Date  . Aortic valve stenosis, severe   . Breast cancer ([place])    right  . Hiatal hernia   . Hypercholesteremia   . Hypoglycemia   . Osteopenia   . Tingling   . Ulcer 2012   bleeding gastric Ulcer   Past Surgical History:  Procedure Laterality Date  . AORTIC VALVE REPLACEMENT N/A 11/16/2014   Procedure: AORTIC VALVE REPLACEMENT (AVR);  Surgeon: Gaye Pollack, MD;  Location: [facility];  Service: Open Heart Surgery;  Laterality: N/A;  . BREAST SURGERY    . CARDIAC CATHETERIZATION N/A 10/18/2014   Procedure: Right/Left Heart Cath and Coronary Angiography;  Surgeon: [name], MD;  Location: [facility];  Service: Cardiovascular;  Laterality: N/A;  . COSMETIC SURGERY     face  . PARATHYROIDECTOMY    . TEE WITHOUT CARDIOVERSION N/A 11/16/2014   Procedure: TRANSESOPHAGEAL ECHOCARDIOGRAM (TEE);  Surgeon: Gaye Pollack, MD;  Location: [facility];  Service: Open Heart Surgery;  Laterality: N/A;  . TONSILLECTOMY       ALLERGIES:  Allergies  Allergen Reactions  . Amoxicillin-Pot Clavulanate Hives and Rash  . Penicillin G Rash  . Penicillins Hives and Rash  . Sulfa Antibiotics Rash  .  Sulfamethoxazole Rash  . Sulfonamide Derivatives Hives     CURRENT MEDICATIONS:  Outpatient Encounter Medications as of 11/13/2017  Medication Sig  . acetaminophen (TYLENOL) 500 MG tablet Take 500-1,000 mg by mouth at bedtime as needed for mild pain or moderate pain.  Marland Kitchen ALPRAZolam (XANAX) 0.5 MG tablet Take 1 tablet (0.5 mg total) by mouth daily as needed for anxiety. Do not take at night  . amLODipine (NORVASC) 2.5 MG tablet Take 1 tablet  (2.5 mg total) by mouth daily.  Marland Kitchen aspirin EC 81 MG tablet Take 1 tablet (81 mg total) by mouth daily. (Patient taking differently: 81 mg. Taking four tablets daily.)  . Calcium Carb-Cholecalciferol (CALCIUM 600+D3) 600-400 MG-UNIT TABS Take 1 tablet by mouth daily.  . Chloral Hydrate CRYS Take 10 mLs by mouth at bedtime as needed (Call  liquid into [place]).  . Cholecalciferol (VITAMIN D-3) 1000 UNITS CAPS Take 1 capsule by mouth daily.   . clindamycin (CLEOCIN) 300 MG capsule Take 600 mg by mouth as directed. Take 600 mg (2 capsules) by mouth one hour prior to dental procedures  . Coenzyme Q10 (CO Q 10 PO) Take 1 capsule by mouth daily.  . Magnesium 400 MG CAPS Take 400 mg by mouth every morning.   Marland Kitchen Ophthalmic Irrigation Solution (OCUSOFT EYE [place] OP) Apply 1 application to eye 2 (two) times daily as needed (dry eyes). Morning and before bed  . Polyethyl Glycol-Propyl Glycol (SYSTANE ULTRA PF) 0.4-0.3 % SOLN Apply 1 drop to eye 2 (two) times daily.  . rosuvastatin (CRESTOR) 10 MG tablet Take 1 tablet (10 mg total) by mouth daily.  . Wheat Dextrin (BENEFIBER DRINK MIX PO) Take 5 mLs by mouth every morning. Mix with 1/2 a glass water, and takes with vitamins   No facility-administered encounter medications on file as of 11/13/2017.      ONCOLOGIC FAMILY HISTORY:  Family History  Problem Relation Age of Onset  . Hypertension Mother   . Rectal cancer Mother   . Colon cancer Mother   . Kidney disease Father       SOCIAL HISTORY:  Social History   Socioeconomic History  . Marital status: Married    Spouse name: Not on file  . Number of children: 3  . Years of education: 16  . Highest education level: Bachelor's degree (e.g., BA, AB, BS)  Occupational History  . Occupation: Retired  Scientific laboratory technician  . Financial resource strain: Not hard at all  . Food insecurity:    Worry: Never true    Inability: Never true  . Transportation needs:    Medical: No     Non-medical: No  Tobacco Use  . Smoking status: Former Smoker    Packs/day: 0.50    Years: 30.00    Pack years: 15.00    Types: Cigarettes  . Smokeless tobacco: Never Used  . Tobacco comment: quit smoking 1980  Substance and Sexual Activity  . Alcohol use: Yes    Alcohol/week: 4.2 oz    Types: 7 Glasses of wine per week    Comment: social  . Drug use: No  . Sexual activity: Not on file  Lifestyle  . Physical activity:    Days per week: 0 days    Minutes per session: 0 min  . Stress: Rather much  Relationships  . Social connections:    Talks on phone: More than three times a week    Gets together: More than three times a week  Attends religious service: Not on file    Active member of club or organization: Not on file    Attends meetings of clubs or organizations: Not on file    Relationship status: Married  . Intimate partner violence:    Fear of current or ex partner: No    Emotionally abused: No    Physically abused: No    Forced sexual activity: No  Other Topics Concern  . Not on file  Social History Narrative   [facility] with her husband.   Right-handed.   Caffeine use: 3-4 cups per day (some tea, mixes coffee with decaf).      PHYSICAL EXAMINATION:  Vital Signs: Vitals:   11/13/17 1103  BP: (!) 129/56  Pulse: (!) 59  Resp: 18  Temp: 97.7 F (36.5 C)  SpO2: 99%   Filed Weights   11/13/17 1103  Weight: 155 lb 12.8 oz (70.7 kg)   General: Well-nourished, well-appearing female in no acute distress.  Unaccompanied today.   HEENT: Head is normocephalic.  Pupils equal and reactive to light. Conjunctivae clear without exudate.  Sclerae anicteric. Oral mucosa is pink, moist.  Oropharynx is pink without lesions or erythema.  Lymph: No cervical, supraclavicular, or infraclavicular lymphadenopathy noted on palpation.  Cardiovascular: Regular rate and rhythm.Marland Kitchen Respiratory: Clear to auscultation bilaterally. Chest expansion symmetric; breathing  non-labored.  Breast Exam:  -Left breast: No appreciable masses on palpation. No skin redness, thickening, or peau d'orange appearance; no nipple retraction or nipple discharge;  -Right breast: No appreciable masses on palpation. No skin redness, thickening, or peau d'orange appearance; no nipple retraction or nipple discharge; mild distortion in symmetry at previous lumpectomy site well healed scar without erythema or nodularity. -Axilla: No axillary adenopathy bilaterally.  GI: Abdomen soft and round; non-tender, non-distended. Bowel sounds normoactive. No hepatosplenomegaly.   GU: Deferred.  Neuro: No focal deficits. Steady gait.  Psych: Mood and affect normal and appropriate for situation.  MSK: No focal spinal tenderness to palpation, full range of motion in bilateral upper extremities Extremities: No edema. Skin: Warm and dry.  LABORATORY DATA:  None for this visit   DIAGNOSTIC IMAGING:  Most recent mammogram: unavailable    ASSESSMENT AND PLAN:  Ms.. Paff is a pleasant 84 y.o. female with history of Stage IA right breast invasive lobular carcinoma, ER+/PR-/HER2-, diagnosed in 01/2004, treated with lumpectomy, adjuvant radiation therapy, and anti-estrogen therapy with x 5 years.  [facility] for [facility] and routine follow-up.   1. History of breast cancer:  Ms. Truss is currently clinically and radiographically without evidence of disease or recurrence of breast cancer. I am unsure when her mammogram is due.  The report is not in epic.  We have called the breast center, since it was apparently done in [place], but sent to Dr. Owens Shark at the breast center.  Evidently there were difficulties with the mammogram and it had to be interpreted multiple times.  Once I see the report, I can tell her when her mammogram is due.  I encouraged her to call me with any questions or concerns before her next visit at the cancer center, and I would be happy to see  her sooner, if needed.    2. Bone health:  Given Ms. Egle's age, history of breast cancer, and her previous anti-estrogen therapy with aromatase inhibitors, she is at risk for bone demineralization. Her last DEXA scan was on 09/09/16 and demonstrated osteopenia with a t score of -1.7  in the right femoral neck.  She will be due for repeat bone density testing in 08/2018 and I will defer to her PCP about this.  She was given education on specific food and activities to promote bone health.  3. Cancer screening:  Due to Ms. Shor's history and her age, she should receive screening for skin cancers.She was encouraged to follow-up with her PCP for appropriate cancer screenings.   4. Health maintenance and wellness promotion: Ms. Vandekamp was encouraged to consume 5-7 servings of fruits and vegetables per day. She was also encouraged to engage in moderate to vigorous exercise for 30 minutes per day most days of the week. She was instructed to limit her alcohol consumption and continue to abstain from tobacco use.    Dispo:  -Return to cancer center in one year for LTS follow up -Mammogram due pending review of results from the breast center   A total of (30) minutes of face-to-face time was spent with this patient with greater than 50% of that time in counseling and care-coordination.   Gardenia Phlegm, NP [facility] [phone]   Note: PRIMARY CARE PROVIDER [name], [place] [phone]

## 2017-11-13 NOTE — Patient Instructions (Signed)
Bone Health Bones protect organs, store calcium, and anchor muscles. Good health habits, such as eating nutritious foods and exercising regularly, are important for maintaining healthy bones. They can also help to prevent a condition that causes bones to lose density and become weak and brittle (osteoporosis). Why is bone mass important? Bone mass refers to the amount of bone tissue that you have. The higher your bone mass, the stronger your bones. An important step toward having healthy bones throughout life is to have strong and dense bones during childhood. A young adult who has a high bone mass is more likely to have a high bone mass later in life. Bone mass at its greatest it is called peak bone mass. A large decline in bone mass occurs in older adults. In women, it occurs about the time of menopause. During this time, it is important to practice good health habits, because if more bone is lost than what is replaced, the bones will become less healthy and more likely to break (fracture). If you find that you have a low bone mass, you may be able to prevent osteoporosis or further bone loss by changing your diet and lifestyle. How can I find out if my bone mass is low? Bone mass can be measured with an X-ray test that is called a bone mineral density (BMD) test. This test is recommended for all women who are age 65 or older. It may also be recommended for men who are age 70 or older, or for people who are more likely to develop osteoporosis due to:  Having bones that break easily.  Having a long-term disease that weakens bones, such as kidney disease or rheumatoid arthritis.  Having menopause earlier than normal.  Taking medicine that weakens bones, such as steroids, thyroid hormones, or hormone treatment for breast cancer or prostate cancer.  Smoking.  Drinking three or more alcoholic drinks each day.  What are the nutritional recommendations for healthy bones? To have healthy bones, you  need to get enough of the right minerals and vitamins. Most nutrition experts recommend getting these nutrients from the foods that you eat. Nutritional recommendations vary from person to person. Ask your health care provider what is healthy for you. Here are some general guidelines. Calcium Recommendations Calcium is the most important (essential) mineral for bone health. Most people can get enough calcium from their diet, but supplements may be recommended for people who are at risk for osteoporosis. Good sources of calcium include:  Dairy products, such as low-fat or nonfat milk, cheese, and yogurt.  Dark green leafy vegetables, such as bok choy and broccoli.  Calcium-fortified foods, such as orange juice, cereal, bread, soy beverages, and tofu products.  Nuts, such as almonds.  Follow these recommended amounts for daily calcium intake:  Children, age 1?3: 700 mg.  Children, age 4?8: 1,000 mg.  Children, age 9?13: 1,300 mg.  Teens, age 14?18: 1,300 mg.  Adults, age 19?50: 1,000 mg.  Adults, age 51?70: ? Men: 1,000 mg. ? Women: 1,200 mg.  Adults, age 71 or older: 1,200 mg.  Pregnant and breastfeeding females: ? Teens: 1,300 mg. ? Adults: 1,000 mg.  Vitamin D Recommendations Vitamin D is the most essential vitamin for bone health. It helps the body to absorb calcium. Sunlight stimulates the skin to make vitamin D, so be sure to get enough sunlight. If you live in a cold climate or you do not get outside often, your health care provider may recommend that you take vitamin   D supplements. Good sources of vitamin D in your diet include:  Egg yolks.  Saltwater fish.  Milk and cereal fortified with vitamin D.  Follow these recommended amounts for daily vitamin D intake:  Children and teens, age 1?18: 600 international units.  Adults, age 50 or younger: 400-800 international units.  Adults, age 51 or older: 800-1,000 international units.  Other Nutrients Other nutrients  for bone health include:  Phosphorus. This mineral is found in meat, poultry, dairy foods, nuts, and legumes. The recommended daily intake for adult men and adult women is 700 mg.  Magnesium. This mineral is found in seeds, nuts, dark green vegetables, and legumes. The recommended daily intake for adult men is 400?420 mg. For adult women, it is 310?320 mg.  Vitamin K. This vitamin is found in green leafy vegetables. The recommended daily intake is 120 mg for adult men and 90 mg for adult women.  What type of physical activity is best for building and maintaining healthy bones? Weight-bearing and strength-building activities are important for building and maintaining peak bone mass. Weight-bearing activities cause muscles and bones to work against gravity. Strength-building activities increases muscle strength that supports bones. Weight-bearing and muscle-building activities include:  Walking and hiking.  Jogging and running.  Dancing.  Gym exercises.  Lifting weights.  Tennis and racquetball.  Climbing stairs.  Aerobics.  Adults should get at least 30 minutes of moderate physical activity on most days. Children should get at least 60 minutes of moderate physical activity on most days. Ask your health care provide what type of exercise is best for you. Where can I find more information? For more information, check out the following websites:  National Osteoporosis Foundation: http://nof.org/learn/basics  National Institutes of Health: http://www.niams.nih.gov/Health_Info/Bone/Bone_Health/bone_health_for_life.asp  This information is not intended to replace advice given to you by your health care provider. Make sure you discuss any questions you have with your health care provider. Document Released: 07/05/2003 Document Revised: 11/02/2015 Document Reviewed: 04/19/2014 Elsevier Interactive Patient Education  2018 Elsevier Inc.  

## 2017-11-13 NOTE — Telephone Encounter (Signed)
Gave avs and calendar ° °

## 2017-11-17 ENCOUNTER — Telehealth: Payer: Self-pay

## 2017-11-17 ENCOUNTER — Other Ambulatory Visit: Payer: Self-pay | Admitting: Internal Medicine

## 2017-11-17 DIAGNOSIS — Z1231 Encounter for screening mammogram for malignant neoplasm of breast: Secondary | ICD-10-CM

## 2017-11-17 NOTE — Telephone Encounter (Signed)
Received VM from patient regarding when is her next mammogram.  Reviewed [name] notes from 7-19, she was waiting to find out from the breast center - per Mendel Ryder NP "Once I see the report, I can tell her when her mammogram is due. "  Outgoing call to the breast center, spoke to [place]- per her there is a note from 06-17-2017 that Dr Owens Shark received films and they were reviewed and next screening mammogram is due Feb 25, 2018.    I called pt back and let her know the above info and that she can call for appt at the breast center.  Pt agreeable and I provided her with their number.  Pt asking about if her insurance will pay for it.  I directed her to call the breast center and see if they can provide with more info regarding insurance.  Pt voiced understanding and no other needs per pt at this time.  I called the breast center back and requested a report since I do not see one in Epic. [place] pointed out that last diagnostic mammogram was in 03-2017 - of left breast and last screening mammogram was 01-2017 of both breasts. There is a report from 04-24-2017 in media tab labeled '[facility]'.  [place] said she was able to speak with patient few mins ago and got her scheduled for mammogram in October.

## 2017-12-08 DIAGNOSIS — H60333 Swimmer's ear, bilateral: Secondary | ICD-10-CM | POA: Diagnosis not present

## 2017-12-08 DIAGNOSIS — H6123 Impacted cerumen, bilateral: Secondary | ICD-10-CM | POA: Diagnosis not present

## 2017-12-09 DIAGNOSIS — H2513 Age-related nuclear cataract, bilateral: Secondary | ICD-10-CM | POA: Diagnosis not present

## 2017-12-09 DIAGNOSIS — H348312 Tributary (branch) retinal vein occlusion, right eye, stable: Secondary | ICD-10-CM | POA: Diagnosis not present

## 2017-12-09 DIAGNOSIS — H11823 Conjunctivochalasis, bilateral: Secondary | ICD-10-CM | POA: Diagnosis not present

## 2017-12-09 DIAGNOSIS — H43812 Vitreous degeneration, left eye: Secondary | ICD-10-CM | POA: Diagnosis not present

## 2017-12-14 ENCOUNTER — Telehealth: Payer: Self-pay | Admitting: Internal Medicine

## 2017-12-14 ENCOUNTER — Observation Stay (HOSPITAL_COMMUNITY)
Admission: EM | Admit: 2017-12-14 | Discharge: 2017-12-15 | Disposition: A | Discharge status: Home / Self Care | Payer: Medicare Other | Attending: Internal Medicine | Admitting: Internal Medicine

## 2017-12-14 ENCOUNTER — Other Ambulatory Visit (HOSPITAL_COMMUNITY): Payer: Medicare Other

## 2017-12-14 ENCOUNTER — Encounter (HOSPITAL_COMMUNITY): Payer: Self-pay | Admitting: Internal Medicine

## 2017-12-14 ENCOUNTER — Other Ambulatory Visit: Payer: Self-pay

## 2017-12-14 ENCOUNTER — Emergency Department (HOSPITAL_COMMUNITY): Payer: Medicare Other

## 2017-12-14 DIAGNOSIS — Z952 Presence of prosthetic heart valve: Secondary | ICD-10-CM | POA: Diagnosis not present

## 2017-12-14 DIAGNOSIS — Z8719 Personal history of other diseases of the digestive system: Secondary | ICD-10-CM | POA: Diagnosis not present

## 2017-12-14 DIAGNOSIS — Z808 Family history of malignant neoplasm of other organs or systems: Secondary | ICD-10-CM | POA: Diagnosis not present

## 2017-12-14 DIAGNOSIS — M1990 Unspecified osteoarthritis, unspecified site: Secondary | ICD-10-CM | POA: Insufficient documentation

## 2017-12-14 DIAGNOSIS — R55 Syncope and collapse: Secondary | ICD-10-CM | POA: Diagnosis not present

## 2017-12-14 DIAGNOSIS — Z880 Allergy status to penicillin: Secondary | ICD-10-CM | POA: Diagnosis not present

## 2017-12-14 DIAGNOSIS — Z8711 Personal history of peptic ulcer disease: Secondary | ICD-10-CM | POA: Diagnosis not present

## 2017-12-14 DIAGNOSIS — Z882 Allergy status to sulfonamides status: Secondary | ICD-10-CM | POA: Diagnosis not present

## 2017-12-14 DIAGNOSIS — R0902 Hypoxemia: Secondary | ICD-10-CM | POA: Diagnosis not present

## 2017-12-14 DIAGNOSIS — K921 Melena: Secondary | ICD-10-CM | POA: Diagnosis not present

## 2017-12-14 DIAGNOSIS — Z955 Presence of coronary angioplasty implant and graft: Secondary | ICD-10-CM | POA: Insufficient documentation

## 2017-12-14 DIAGNOSIS — Z800 Family history of malignant neoplasm of digestive organs: Secondary | ICD-10-CM | POA: Insufficient documentation

## 2017-12-14 DIAGNOSIS — Z9889 Other specified postprocedural states: Secondary | ICD-10-CM | POA: Diagnosis not present

## 2017-12-14 DIAGNOSIS — K449 Diaphragmatic hernia without obstruction or gangrene: Secondary | ICD-10-CM | POA: Diagnosis not present

## 2017-12-14 DIAGNOSIS — Z7982 Long term (current) use of aspirin: Secondary | ICD-10-CM | POA: Insufficient documentation

## 2017-12-14 DIAGNOSIS — K259 Gastric ulcer, unspecified as acute or chronic, without hemorrhage or perforation: Secondary | ICD-10-CM | POA: Diagnosis not present

## 2017-12-14 DIAGNOSIS — Z79899 Other long term (current) drug therapy: Secondary | ICD-10-CM | POA: Diagnosis not present

## 2017-12-14 DIAGNOSIS — D62 Acute posthemorrhagic anemia: Secondary | ICD-10-CM | POA: Diagnosis present

## 2017-12-14 DIAGNOSIS — K3189 Other diseases of stomach and duodenum: Secondary | ICD-10-CM | POA: Diagnosis not present

## 2017-12-14 DIAGNOSIS — Z87891 Personal history of nicotine dependence: Secondary | ICD-10-CM | POA: Diagnosis not present

## 2017-12-14 DIAGNOSIS — Z853 Personal history of malignant neoplasm of breast: Secondary | ICD-10-CM

## 2017-12-14 DIAGNOSIS — Z8249 Family history of ischemic heart disease and other diseases of the circulatory system: Secondary | ICD-10-CM | POA: Insufficient documentation

## 2017-12-14 DIAGNOSIS — E7800 Pure hypercholesterolemia, unspecified: Secondary | ICD-10-CM | POA: Insufficient documentation

## 2017-12-14 DIAGNOSIS — R531 Weakness: Secondary | ICD-10-CM | POA: Diagnosis not present

## 2017-12-14 DIAGNOSIS — R197 Diarrhea, unspecified: Secondary | ICD-10-CM | POA: Diagnosis not present

## 2017-12-14 DIAGNOSIS — D539 Nutritional anemia, unspecified: Secondary | ICD-10-CM

## 2017-12-14 DIAGNOSIS — I959 Hypotension, unspecified: Secondary | ICD-10-CM | POA: Diagnosis not present

## 2017-12-14 LAB — CBC WITH DIFFERENTIAL/PLATELET
ABS IMMATURE GRANULOCYTES: 0.1 10*3/uL (ref 0.0–0.1)
Abs Immature Granulocytes: 0 10*3/uL (ref 0.0–0.1)
BASOS ABS: 0 10*3/uL (ref 0.0–0.1)
Basophils Absolute: 0.1 10*3/uL (ref 0.0–0.1)
Basophils Relative: 0 %
Basophils Relative: 0 %
Eosinophils Absolute: 0.1 10*3/uL (ref 0.0–0.7)
Eosinophils Absolute: 0 10*3/uL (ref 0.0–0.7)
Eosinophils Relative: 1 %
Eosinophils Relative: 0 %
HCT: 34.5 % — ABNORMAL LOW (ref 36.0–46.0)
HEMATOCRIT: 29.4 % — AB (ref 36.0–46.0)
HEMOGLOBIN: 9.4 g/dL — AB (ref 12.0–15.0)
Hemoglobin: 11 g/dL — ABNORMAL LOW (ref 12.0–15.0)
IMMATURE GRANULOCYTES: 0 %
IMMATURE GRANULOCYTES: 0 %
LYMPHS ABS: 2 10*3/uL (ref 0.7–4.0)
LYMPHS PCT: 26 %
Lymphocytes Relative: 14 %
Lymphs Abs: 1.8 10*3/uL (ref 0.7–4.0)
MCH: 32.1 pg (ref 26.0–34.0)
MCH: 31.8 pg (ref 26.0–34.0)
MCHC: 31.9 g/dL (ref 30.0–36.0)
MCHC: 32 g/dL (ref 30.0–36.0)
MCV: 100.6 fL — AB (ref 78.0–100.0)
MCV: 99.3 fL (ref 78.0–100.0)
MONO ABS: 0.4 10*3/uL (ref 0.1–1.0)
MONOS PCT: 4 %
Monocytes Absolute: 0.4 10*3/uL (ref 0.1–1.0)
Monocytes Relative: 5 %
NEUTROS ABS: 9.8 10*3/uL — AB (ref 1.7–7.7)
NEUTROS PCT: 81 %
NEUTROS PCT: 69 %
Neutro Abs: 5.3 10*3/uL (ref 1.7–7.7)
Platelets: 158 10*3/uL (ref 150–400)
Platelets: 161 10*3/uL (ref 150–400)
RBC: 3.43 MIL/uL — ABNORMAL LOW (ref 3.87–5.11)
RBC: 2.96 MIL/uL — AB (ref 3.87–5.11)
RDW: 13.2 % (ref 11.5–15.5)
RDW: 13.1 % (ref 11.5–15.5)
WBC: 12.2 10*3/uL — ABNORMAL HIGH (ref 4.0–10.5)
WBC: 7.6 10*3/uL (ref 4.0–10.5)

## 2017-12-14 LAB — I-STAT CHEM 8, ED
BUN: 41 mg/dL — ABNORMAL HIGH (ref 8–23)
CALCIUM ION: 1.11 mmol/L — AB (ref 1.15–1.40)
CREATININE: 0.8 mg/dL (ref 0.44–1.00)
Chloride: 104 mmol/L (ref 98–111)
Glucose, Bld: 151 mg/dL — ABNORMAL HIGH (ref 70–99)
HEMATOCRIT: 33 % — AB (ref 36.0–46.0)
Hemoglobin: 11.2 g/dL — ABNORMAL LOW (ref 12.0–15.0)
Potassium: 4.4 mmol/L (ref 3.5–5.1)
SODIUM: 137 mmol/L (ref 135–145)
TCO2: 23 mmol/L (ref 22–32)

## 2017-12-14 LAB — COMPREHENSIVE METABOLIC PANEL
ALBUMIN: 3.2 g/dL — AB (ref 3.5–5.0)
ALK PHOS: 62 U/L (ref 38–126)
ALT: 16 U/L (ref 0–44)
ALT: 12 U/L (ref 0–44)
ANION GAP: 6 (ref 5–15)
AST: 25 U/L (ref 15–41)
AST: 28 U/L (ref 15–41)
Albumin: 3.6 g/dL (ref 3.5–5.0)
Alkaline Phosphatase: 62 U/L (ref 38–126)
Anion gap: 9 (ref 5–15)
BILIRUBIN TOTAL: 0.6 mg/dL (ref 0.3–1.2)
BILIRUBIN TOTAL: 0.5 mg/dL (ref 0.3–1.2)
BUN: 43 mg/dL — AB (ref 8–23)
BUN: 38 mg/dL — AB (ref 8–23)
CALCIUM: 8 mg/dL — AB (ref 8.9–10.3)
CO2: 24 mmol/L (ref 22–32)
CO2: 21 mmol/L — AB (ref 22–32)
Calcium: 8.6 mg/dL — ABNORMAL LOW (ref 8.9–10.3)
Chloride: 106 mmol/L (ref 98–111)
Chloride: 110 mmol/L (ref 98–111)
Creatinine, Ser: 0.91 mg/dL (ref 0.44–1.00)
Creatinine, Ser: 0.76 mg/dL (ref 0.44–1.00)
GFR calc Af Amer: >60 mL/min (ref >60)
GFR calc Af Amer: >60 mL/min (ref >60)
GFR calc non Af Amer: >60 mL/min (ref >60)
GFR, EST NON AFRICAN AMERICAN: 57 mL/min — AB (ref >60)
GLUCOSE: 118 mg/dL — AB (ref 70–99)
Glucose, Bld: 151 mg/dL — ABNORMAL HIGH (ref 70–99)
POTASSIUM: 4.8 mmol/L (ref 3.5–5.1)
Potassium: 4.4 mmol/L (ref 3.5–5.1)
SODIUM: 137 mmol/L (ref 135–145)
Sodium: 139 mmol/L (ref 135–145)
TOTAL PROTEIN: 4.7 g/dL — AB (ref 6.5–8.1)
Total Protein: 5.5 g/dL — ABNORMAL LOW (ref 6.5–8.1)

## 2017-12-14 LAB — I-STAT TROPONIN, ED: Troponin i, poc: 0 ng/mL (ref 0.00–0.08)

## 2017-12-14 LAB — RETICULOCYTES
RBC.: 2.92 MIL/uL — ABNORMAL LOW (ref 3.87–5.11)
Retic Count, Absolute: 46.7 10*3/uL (ref 19.0–186.0)
Retic Ct Pct: 1.6 % (ref 0.4–3.1)

## 2017-12-14 LAB — PROTIME-INR
INR: 1.02
Prothrombin Time: 13.3 seconds (ref 11.4–15.2)

## 2017-12-14 LAB — VITAMIN B12: Vitamin B-12: 113 pg/mL — ABNORMAL LOW (ref 180–914)

## 2017-12-14 LAB — MAGNESIUM: Magnesium: 1.8 mg/dL (ref 1.7–2.4)

## 2017-12-14 LAB — IRON AND TIBC
IRON: 130 ug/dL (ref 28–170)
SATURATION RATIOS: 46 % — AB (ref 10.4–31.8)
TIBC: 284 ug/dL (ref 250–450)
UIBC: 154 ug/dL

## 2017-12-14 LAB — TROPONIN I
Troponin I: <0.03 ng/mL (ref <0.03)
Troponin I: 0.11 ng/mL (ref <0.03)

## 2017-12-14 LAB — OCCULT BLOOD X 1 CARD TO LAB, STOOL: Fecal Occult Bld: POSITIVE — AB

## 2017-12-14 LAB — FERRITIN: FERRITIN: 31 ng/mL (ref 11–307)

## 2017-12-14 LAB — FOLATE: FOLATE: 6 ng/mL (ref >5.9)

## 2017-12-14 LAB — HEMOGLOBIN AND HEMATOCRIT, BLOOD
HEMATOCRIT: 28.6 % — AB (ref 36.0–46.0)
HEMOGLOBIN: 9.2 g/dL — AB (ref 12.0–15.0)

## 2017-12-14 MED ORDER — ACETAMINOPHEN 325 MG PO TABS
650.0000 mg | ORAL_TABLET | Freq: Four times a day (QID) | ORAL | Status: DC | PRN
  Administered 2017-12-14 – 2017-12-15 (×3): 650 mg via ORAL
  Filled 2017-12-14 (×3): qty 2

## 2017-12-14 MED ORDER — ROSUVASTATIN CALCIUM 10 MG PO TABS
10.0000 mg | ORAL_TABLET | Freq: Every day | ORAL | Status: DC
  Administered 2017-12-14 – 2017-12-15 (×2): 10 mg via ORAL
  Filled 2017-12-14 (×3): qty 1

## 2017-12-14 MED ORDER — BISACODYL 10 MG RE SUPP: 10.0000 mg | Freq: Once | RECTAL | Status: DC

## 2017-12-14 MED ORDER — ALPRAZOLAM 0.5 MG PO TABS
0.5000 mg | ORAL_TABLET | Freq: Two times a day (BID) | ORAL | Status: DC | PRN
  Administered 2017-12-14: 0.5 mg via ORAL
  Filled 2017-12-14: qty 1

## 2017-12-14 MED ORDER — SODIUM CHLORIDE 0.9 % IV SOLN
INTRAVENOUS | Status: AC
  Administered 2017-12-14 (×2): via INTRAVENOUS

## 2017-12-14 MED ORDER — ACETAMINOPHEN 650 MG RE SUPP: 650.0000 mg | Freq: Four times a day (QID) | RECTAL | Status: DC | PRN

## 2017-12-14 MED ORDER — SODIUM CHLORIDE 0.9 % IV BOLUS
1000.0000 mL | Freq: Once | INTRAVENOUS | Status: AC
  Administered 2017-12-14: 1000 mL via INTRAVENOUS

## 2017-12-14 MED ORDER — ASPIRIN EC 81 MG PO TBEC
81.0000 mg | DELAYED_RELEASE_TABLET | Freq: Every day | ORAL | Status: DC
  Administered 2017-12-14: 81 mg via ORAL
  Filled 2017-12-14: qty 1

## 2017-12-14 MED ORDER — PANTOPRAZOLE SODIUM 40 MG IV SOLR
40.0000 mg | Freq: Once | INTRAVENOUS | Status: AC
  Administered 2017-12-14: 40 mg via INTRAVENOUS
  Filled 2017-12-14: qty 40

## 2017-12-14 MED ORDER — PANTOPRAZOLE SODIUM 40 MG PO TBEC
40.0000 mg | DELAYED_RELEASE_TABLET | Freq: Two times a day (BID) | ORAL | Status: DC
  Administered 2017-12-15 (×2): 40 mg via ORAL
  Filled 2017-12-14 (×2): qty 1

## 2017-12-14 MED ORDER — PANTOPRAZOLE SODIUM 40 MG PO TBEC
40.0000 mg | DELAYED_RELEASE_TABLET | Freq: Two times a day (BID) | ORAL | Status: DC
  Administered 2017-12-14: 40 mg via ORAL
  Filled 2017-12-14: qty 1

## 2017-12-14 MED ORDER — ENOXAPARIN SODIUM 40 MG/0.4ML ~~LOC~~ SOLN
40.0000 mg | SUBCUTANEOUS | Status: DC
  Filled 2017-12-14: qty 0.4

## 2017-12-14 MED ORDER — ONDANSETRON HCL 4 MG PO TABS: 4.0000 mg | ORAL_TABLET | Freq: Four times a day (QID) | ORAL | Status: DC | PRN

## 2017-12-14 MED ORDER — ONDANSETRON HCL 4 MG/2ML IJ SOLN: 4.0000 mg | Freq: Four times a day (QID) | INTRAMUSCULAR | Status: DC | PRN

## 2017-12-14 MED ORDER — ALPRAZOLAM 0.5 MG PO TABS: 0.5000 mg | ORAL_TABLET | Freq: Every day | ORAL | Status: DC | PRN

## 2017-12-14 MED ORDER — MAGNESIUM OXIDE 400 (241.3 MG) MG PO TABS
400.0000 mg | ORAL_TABLET | Freq: Every day | ORAL | Status: DC
  Administered 2017-12-14 – 2017-12-15 (×2): 400 mg via ORAL
  Filled 2017-12-14 (×2): qty 1

## 2017-12-14 NOTE — Consult Note (Addendum)
Cardiology Consultation:   Patient ID: [name]; [id number]; [birth year]   Admit date: 12/14/2017 Date of Consult: 12/14/2017  Primary Care Provider: [name], MD Primary Cardiologist: Dr. [name], MD   Patient Profile:   [name] is a 84 y.o. female with a hx of aortic valve disease s/p bioprosthetic aortic valve replacement 10/2014, hx of GI ulcer bleed and breast cancer who is being seen today for the evaluation of presyncope at the request of Dr. Hal Hope.  History of Present Illness:   [name] is an 84 year old female with a history as stated above who presented to [facility] on 12/14/2017 after an episode of dizziness and weakness with associated diaphoresis which occurred yesterday evening, 12/13/2017.  Patient states she was in her usual state of health and doing well until yesterday evening after walking through her apartment to lock her door prior to going to bed.  While walking back to her bedroom, she felt an acute onset of dizziness with profuse diaphoresis.  She proceeded to bed to lay down to see if the episode would pass.  A little later, she states she got up to brush her teeth and had a recurrence of the symptoms.  She states she lives at [facility] and reports reports calling the nurse to have her BP checked which was noted to be low normal for her.  She states that she checks her blood pressure on a regular basis at home with a normal to hers SBP in the 120-130 range.  She states that her BP at this time was in the 100/50 range.  She also states that she has recently noticed dark stools including one yesterday and one early this morning.  She has a history of bleeding ulcer. She denies chest pain, LE swelling, orthopnea, recent illness, palpitations or syncope. Given her symptoms, she proceeded to the ED for further evaluation.   In the ED, her BP was low normal (99/45) and she was given a fluid bolus started on IV fluids at 75/h. EKG showed diffuse  T wave inversions, different from prior tracings. CXR was unremarkable for acute cardiopulmonary abnormalities. Troponin levels have been negative x 2, <0.03, <0.03 however, her most recent troponin is 0.11.  Initial Hb was noted to be 11.0 and is dropped to 9.4 in a 3-hour time frame. Fecal occult stool, positive.  GI has been consulted with plans for EGD in AM.   She was last seen Dr. Burt Knack in the office on 09/15/2016 for follow-up and was doing well.  Aortic valve replacement stable per serial echocardiogram.  Past Medical History:  Diagnosis Date  . Aortic valve stenosis, severe   . Breast cancer ([place])    right  . Hiatal hernia   . Hypercholesteremia   . Hypoglycemia   . Osteopenia   . Tingling   . Ulcer 2012   bleeding gastric Ulcer    Past Surgical History:  Procedure Laterality Date  . AORTIC VALVE REPLACEMENT N/A 11/16/2014   Procedure: AORTIC VALVE REPLACEMENT (AVR);  Surgeon: Gaye Pollack, MD;  Location: [facility];  Service: Open Heart Surgery;  Laterality: N/A;  . BREAST SURGERY    . CARDIAC CATHETERIZATION N/A 10/18/2014   Procedure: Right/Left Heart Cath and Coronary Angiography;  Surgeon: [name], MD;  Location: [facility];  Service: Cardiovascular;  Laterality: N/A;  . COSMETIC SURGERY     face  . PARATHYROIDECTOMY    . TEE WITHOUT CARDIOVERSION N/A 11/16/2014  Procedure: TRANSESOPHAGEAL ECHOCARDIOGRAM (TEE);  Surgeon: Gaye Pollack, MD;  Location: [facility];  Service: Open Heart Surgery;  Laterality: N/A;  . TONSILLECTOMY       Prior to Admission medications   Medication Sig Start Date End Date Taking? Authorizing Provider  acetaminophen (TYLENOL) 500 MG tablet Take 500-1,000 mg by mouth at bedtime as needed for mild pain or moderate pain.   Yes [provider]  ALPRAZolam Duanne Moron) 0.5 MG tablet Take 1 tablet (0.5 mg total) by mouth daily as needed for anxiety. Do not take at night 11/12/17  Yes Burns, Claudina Lick, MD  amLODipine (NORVASC) 2.5 MG tablet  Take 1 tablet (2.5 mg total) by mouth daily. 10/13/17  Yes Burns, Claudina Lick, MD  aspirin EC 81 MG tablet Take 1 tablet (81 mg total) by mouth daily. 12/15/14  Yes [name], MD  Calcium Carb-Cholecalciferol (CALCIUM 600+D3) 600-400 MG-UNIT TABS Take 1 tablet by mouth daily.   Yes [provider]  Chloral Hydrate CRYS Take 10 mLs by mouth at bedtime as needed (Call  liquid into [facility]). Patient taking differently: Take 10 mLs by mouth at bedtime as needed (sleep). Call  liquid into [facility] 07/27/17  Yes [name], MD  Cholecalciferol (VITAMIN D-3) 1000 UNITS CAPS Take 1 capsule by mouth daily.    Yes [provider]  clindamycin (CLEOCIN) 300 MG capsule Take 600 mg by mouth as directed. Take 600 mg (2 capsules) by mouth one hour prior to dental procedures   Yes [provider]  Coenzyme Q10 (CO Q 10 PO) Take 1 capsule by mouth daily.   Yes [provider]  Magnesium 400 MG CAPS Take 400 mg by mouth every morning.    Yes [provider]  Ophthalmic Irrigation Solution (OCUSOFT EYE [place] OP) Apply 1 application to eye 2 (two) times daily as needed (dry eyes). Morning and before bed   Yes [provider]  Polyethyl Glycol-Propyl Glycol (SYSTANE ULTRA PF) 0.4-0.3 % SOLN Apply 1 drop to eye 2 (two) times daily.   Yes [provider]  rosuvastatin (CRESTOR) 10 MG tablet Take 1 tablet (10 mg total) by mouth daily. 10/15/17  Yes Burns, Claudina Lick, MD  Wheat Dextrin (BENEFIBER DRINK MIX PO) Take 5 mLs by mouth every morning. Mix with 1/2 a glass water, and takes with vitamins   Yes [provider]    Inpatient Medications: Scheduled Meds: . bisacodyl  10 mg Rectal Once  . magnesium oxide  400 mg Oral Daily  . pantoprazole  40 mg Oral BID  . rosuvastatin  10 mg Oral Daily   Continuous Infusions: . sodium chloride 75 mL/hr at 12/14/17 1040   PRN Meds: acetaminophen **OR** acetaminophen,  ALPRAZolam, ondansetron **OR** ondansetron (ZOFRAN) IV  Allergies:    Allergies  Allergen Reactions  . Amoxicillin-Pot Clavulanate Hives and Rash  . Penicillin G Rash  . Penicillins Hives and Rash  . Sulfa Antibiotics Rash  . Sulfamethoxazole Rash  . Sulfonamide Derivatives Hives    Social History:   Social History   Socioeconomic History  . Marital status: Married    Spouse name: Not on file  . Number of children: 3  . Years of education: 16  . Highest education level: Bachelor's degree (e.g., BA, AB, BS)  Occupational History  . Occupation: Retired  Scientific laboratory technician  . Financial resource strain: Not hard at all  . Food insecurity:    Worry: Never true  Inability: Never true  . Transportation needs:    Medical: No    Non-medical: No  Tobacco Use  . Smoking status: Former Smoker    Packs/day: 0.50    Years: 30.00    Pack years: 15.00    Types: Cigarettes  . Smokeless tobacco: Never Used  . Tobacco comment: quit smoking 1980  Substance and Sexual Activity  . Alcohol use: Yes    Alcohol/week: 7.0 standard drinks    Types: 7 Glasses of wine per week    Comment: social  . Drug use: No  . Sexual activity: Not on file  Lifestyle  . Physical activity:    Days per week: 0 days    Minutes per session: 0 min  . Stress: Rather much  Relationships  . Social connections:    Talks on phone: More than three times a week    Gets together: More than three times a week    Attends religious service: Not on file    Active member of club or organization: Not on file    Attends meetings of clubs or organizations: Not on file    Relationship status: Married  . Intimate partner violence:    Fear of current or ex partner: No    Emotionally abused: No    Physically abused: No    Forced sexual activity: No  Other Topics Concern  . Not on file  Social History Narrative   Lives at [facility] with her husband.   Right-handed.   Caffeine use: 3-4 cups per day (some tea, mixes  coffee with decaf).    Family History:   Family History  Problem Relation Age of Onset  . Hypertension Mother   . Rectal cancer Mother   . Colon cancer Mother   . Kidney disease Father    Family Status:  Family Status  Relation Name Status  . Mother  Deceased at age 41       natural causes  . MGM  Deceased  . MGF  Deceased  . PGM  Deceased  . PGF  Deceased  . Father  Deceased    ROS:  Please see the history of present illness.  All other ROS reviewed and negative.     Physical Exam/Data:   Vitals:   12/14/17 1215 12/14/17 1230 12/14/17 1245 12/14/17 1315  BP:      Pulse: 70 68 68 66  Resp: 13 17 16 18   Temp:      TempSrc:      SpO2: 99% 99% 100% 99%    Intake/Output Summary (Last 24 hours) at 12/14/2017 1415 Last data filed at 12/14/2017 1137 Gross per 24 hour  Intake 1000 ml  Output 400 ml  Net 600 ml   There were no vitals filed for this visit. There is no height or weight on file to calculate BMI.   General: Well developed, well nourished, NAD Skin: Warm, dry, intact  Head: Normocephalic, atraumatic, clear, moist mucus membranes. Neck: Negative for carotid bruits. No JVD Lungs:Clear to ausculation bilaterally. No wheezes, rales, or rhonchi. Breathing is unlabored. Cardiovascular: RRR with S1 S2. + murmurs. No rubs or gallops  Abdomen: Soft, non-tender, non-distended with normoactive bowel sounds.  No obvious abdominal masses. MSK: Strength and tone appear normal for age. 5/5 in all extremities Extremities: No edema. No clubbing or cyanosis. DP/PT pulses 2+ bilaterally Neuro: Alert and oriented. No focal deficits. No facial asymmetry. MAE spontaneously. Psych: Responds to questions appropriately with normal affect.  EKG:  The EKG was personally reviewed and demonstrates: NSR HR 63, diffuse nonspecific T wave inversions throughouy Telemetry:  Telemetry was personally reviewed and demonstrates: NSR   Relevant CV Studies:  Echo: 09/08/2016 Study  Conclusions  - Left ventricle: The cavity size was normal. There was mild concentric hypertrophy. Systolic function was vigorous. The estimated ejection fraction was in the range of 65% to 70%. Wall motion was normal; there were no regional wall motion abnormalities. Left ventricular diastolic function parameters were normal. - Aortic valve: There was mild regurgitation. Mean gradient (S): 15 mm Hg. Peak gradient (S): 30 mm Hg. - Aortic root: The aortic root was normal in size. - Mitral valve: There was no regurgitation. - Left atrium: The atrium was severely dilated. - Right ventricle: Systolic function was normal. - Right atrium: The atrium was mildly dilated. - Tricuspid valve: There was moderate regurgitation. - Pulmonic valve: There was mild regurgitation. - Pulmonary arteries: Systolic pressure was mildly increased. PA peak pressure: 39 mm Hg (S). - Inferior vena cava: The vessel was normal in size. - Pericardium, extracardiac: There was no pericardial effusion.  Impressions:  - A bioprosthetic valve sits well in the aortic position. There was mild central aortic insufficiency and no paravalvular leak. Transaortic gradients are normal. There is no significant change from the prior study on 11/27/2015.  CATH: 10/16/2014 Final conclusions: 1. Widely patent coronary arteries without evidence of obstructive coronary artery disease 2. Bulky calcification of the aortic valve with restricted mobility based on fluoroscopic assessment 3. Essentially normal right heart pressures and normal cardiac output  Plan: refer to Dr Cyndia Bent for consideration of aortic valve replacement  Laboratory Data:  Chemistry Recent Labs  Lab 12/14/17 0319 12/14/17 0354 12/14/17 0634  NA 139 137 137  K 4.4 4.4 4.8  CL 106 104 110  CO2 24  --  21*  GLUCOSE 151* 151* 118*  BUN 43* 41* 38*  CREATININE 0.91 0.80 0.76  CALCIUM 8.6*  --  8.0*  GFRNONAA 57*  --  >60  GFRAA  >60  --  >60  ANIONGAP 9  --  6    Total Protein  Date Value Ref Range Status  12/14/2017 4.7 (L) 6.5 - 8.1 g/dL Final  10/31/2014 6.3 (L) 6.4 - 8.3 g/dL Final   Albumin  Date Value Ref Range Status  12/14/2017 3.2 (L) 3.5 - 5.0 g/dL Final  10/31/2014 3.8 3.5 - 5.0 g/dL Final   AST  Date Value Ref Range Status  12/14/2017 28 15 - 41 U/L Final  10/31/2014 25 5 - 34 U/L Final   ALT  Date Value Ref Range Status  12/14/2017 12 0 - 44 U/L Final  10/31/2014 20 0 - 55 U/L Final   Alkaline Phosphatase  Date Value Ref Range Status  12/14/2017 62 38 - 126 U/L Final  10/31/2014 63 40 - 150 U/L Final   Total Bilirubin  Date Value Ref Range Status  12/14/2017 0.5 0.3 - 1.2 mg/dL Final  10/31/2014 0.66 0.20 - 1.20 mg/dL Final   Hematology Recent Labs  Lab 12/14/17 0319 12/14/17 0354 12/14/17 0634  WBC 12.2*  --  7.6  RBC 3.43*  --  2.96*  2.92*  HGB 11.0* 11.2* 9.4*  HCT 34.5* 33.0* 29.4*  MCV 100.6*  --  99.3  MCH 32.1  --  31.8  MCHC 31.9  --  32.0  RDW 13.2  --  13.1  PLT 158  --  161   Cardiac Enzymes Recent  Labs  Lab 12/14/17 0319 12/14/17 0634  TROPONINI <0.03 <0.03    Recent Labs  Lab 12/14/17 0352  TROPIPOC 0.00    BNPNo results for input(s): BNP, PROBNP in the last 168 hours.  DDimer No results for input(s): DDIMER in the last 168 hours. TSH:  Lab Results  Component Value Date   TSH 0.69 10/02/2017   Lipids: Lab Results  Component Value Date   CHOL 263 (H) 10/02/2017   HDL 84.20 10/02/2017   LDLCALC 160 (H) 10/02/2017   LDLDIRECT 144.0 11/29/2012   TRIG 94.0 10/02/2017   CHOLHDL 3 10/02/2017   HgbA1c: Lab Results  Component Value Date   HGBA1C 5.9 10/02/2017    Radiology/Studies:  Dg Chest 2 View  Result Date: 12/14/2017 CLINICAL DATA:  Initial evaluation for generalized weakness. EXAM: CHEST - 2 VIEW COMPARISON:  Prior radiograph from 12/20/2014. FINDINGS: Median sternotomy wires with underlying surgical clips noted. Prosthetic valve  noted. Cardiomegaly unchanged. Tortuosity the intrathoracic aorta noted. Mediastinal silhouette normal. Lungs normally inflated. No focal infiltrates. No pulmonary edema or pleural effusion. No pneumothorax. No acute osseus abnormality. IMPRESSION: 1. No active cardiopulmonary disease. 2. Mild cardiomegaly with aortic valvular prosthesis. Electronically Signed   By: Jeannine Boga M.D.   On: 12/14/2017 02:58   Assessment and Plan:   1.  Presyncope: -Patient reports a near syncopal episode with diaphoresis and history of aortic valve replacement>> found to have an acute drop in hemoglobin 11.0>9.4 in 3-hour window with positive fecal occult stool>> consulted with plans for EGD 12/15/2017 -Troponin, negative x2>>however last level found to be 0.11 without c/o of chest pain  -EKG with new diffuse T wave inversions, change from last tracing -No arrhythmias per telemetry review -Will recheck echocardiogram given history of aortic valve replacement and EKG changes -Not currently on AV blocking agents secondary to history of bradycardia  2. Aortic valve disease s/p bioprosthetic valve replacement 10/2014: -Stable, followed by Dr. Burt Knack, last in 08/2016 doing well -Will obtain echocardiogram for comparison   3. Bradycardia: -BB discontinue last year according to chart review>>>improved  -Heart rate currently in the 60s to 70s  4.  Hypotension: -Resolved after IV hydration -Currently receiving NS at 55ml/ hour -Antihypertensives currently on hold   For questions or updates, please contact [place] Please consult [email or web address] for contact info under Cardiology/STEMI.   Lyndel Safe NP-C [facility] Pager: [phone] 12/14/2017 2:15 PM  Attending Note:   The patient was seen and examined.  Agree with assessment and plan as noted above.  Changes made to the above note as needed.  Patient seen and independently examined with Kathyrn Drown, NP.   We discussed all aspects  of the encounter. I agree with the assessment and plan as stated above.  1.  Presyncope: Episode of presyncope as might corresponds to a fall and hemoglobin today.  I suspect that she has acute blood loss as an explanation for her syncopal episode.  Her symptoms are consistent with orthostatic hypotension and not necessarily a cardiac etiology.  Catheterization several years ago prior to her aortic valve replacement revealed normal coronary arteries.  Third troponin level is minimally elevated which is likely due to demand ischemia in the setting of increased stress.  Will get an echocardiogram for further evaluation of her LV function. I anticipate that she will be able to be discharged tomorrow.  2.  Acute blood loss: The GI team is following.  She is scheduled for upper endoscopy tomorrow.   I have spent a  total of 40 minutes with patient reviewing hospital  notes , telemetry, EKGs, labs and examining patient as well as establishing an assessment and plan that was discussed with the patient. > 50% of time was spent in direct patient care.    Thayer Headings, Brooke Bonito., MD, [facility] 12/14/2017, 4:05 PM [address]  [place] Pager [phone]

## 2017-12-14 NOTE — ED Notes (Signed)
Pt given water OK per MD Isaacs.

## 2017-12-14 NOTE — ED Notes (Signed)
MD at bedside. 

## 2017-12-14 NOTE — H&P (View-Only) (Signed)
[facility] Consult: 1:15 PM 12/14/2017  LOS: 0 days    Referring Provider: Dr. Waldron Labs Primary Care Physician:  [name], MD Primary Gastroenterologist:  Dr. Hilarie Fredrickson.      Reason for Consultation: Anemia   HPI: [name] is a 84 y.o. female.  Hx: s/p AV replacement, Edwards magna ease pericardial valve 10/2014.   Breast cancer, lumpectomy in 2005.  2009 colonoscopy.  Dr. Henrene Pastor.  Showed incidental pneumatosis intestinalis cystoidis.   GIB with melena and antral gastric ulcer on EGD 11/2010, pathology showed reactive gastropathy.  Stained negative for H. pylori.  No dysplasia, no malignancy..   Follow-up EGD in 01/2011 showed ulcer still present. 08/2011 EGD to follow-up history gastric ulcer.  Ulcer site gastric antrum healed but lingering residual erythema was biopsied.  Small hiatal hernia.  Biopsy showed benign antral mucosa with minimal chronic inflammation.  No H. pylori, dysplasia, malignancy. 08/2015 Sigmoidoscopy in [place].  Internal hemorroids.    Late yesterday evening the patient developed dizziness and presyncope.  At around 11 PM she had a large, watery, dark, but not obviously bloody, BM.  This morning she also felt dizzy, presyncopal, diaphoretic and this time when she passed the stool it was dark and she saw what looked like some blood coming out of the stool.  She said some nonspecific vague discomfort in her lower abdomen associated with the bowel movement yesterday and this morning.  She has not had any epigastric pain.  She has a good appetite, generally no reflux symptoms.  She does not take any PPI or H2 blocker, nor does she take aspirin (81 ASA listed but pt says she is not taking) or NSAIDs. She attributes what is possibly an ulcer to the and her husband having moved 3 times  in the last 4 months.  The first move was from [place] back to their condominium in [place], then they moved out of their condominium in [place] and into independent living at [facility]. Patient was transported by EMS to the ED. BPs at arrival as 99/45.  Pulse consistently in the 60s.  With IV fluids her blood pressure now in the 130s over 50s.  She is oxygenating well on room air.  EKG showed diffuse T wave changes.  Cardiology has been notified but troponin I negative x2 a third, point-of-care, troponin also negative.  She has not had any chest pain or perceived any palpitations. Hgb ~ 14 in 09/2017 Today Hgb 11 >> 9.4.   BUN elevated at 43 >> 38.   No evidence of iron deficiency. Coags have not been obtained.  Patient lives with her husband at [place].  She drinks a small glass of wine with dinner every evening.  Past Medical History:  Diagnosis Date  . Aortic valve stenosis, severe   . Breast cancer ([place])    right  . Hiatal hernia   . Hypercholesteremia   . Hypoglycemia   . Osteopenia   . Tingling   . Ulcer 2012   bleeding gastric Ulcer    Past Surgical History:  Procedure  Laterality Date  . AORTIC VALVE REPLACEMENT N/A 11/16/2014   Procedure: AORTIC VALVE REPLACEMENT (AVR);  Surgeon: Gaye Pollack, MD;  Location: [facility];  Service: Open Heart Surgery;  Laterality: N/A;  . BREAST SURGERY    . CARDIAC CATHETERIZATION N/A 10/18/2014   Procedure: Right/Left Heart Cath and Coronary Angiography;  Surgeon: [name], MD;  Location: [facility];  Service: Cardiovascular;  Laterality: N/A;  . COSMETIC SURGERY     face  . PARATHYROIDECTOMY    . TEE WITHOUT CARDIOVERSION N/A 11/16/2014   Procedure: TRANSESOPHAGEAL ECHOCARDIOGRAM (TEE);  Surgeon: Gaye Pollack, MD;  Location: [facility];  Service: Open Heart Surgery;  Laterality: N/A;  . TONSILLECTOMY      Prior to Admission medications   Medication Sig Start Date End Date Taking? Authorizing Provider  acetaminophen  (TYLENOL) 500 MG tablet Take 500-1,000 mg by mouth at bedtime as needed for mild pain or moderate pain.   Yes [provider]  ALPRAZolam Duanne Moron) 0.5 MG tablet Take 1 tablet (0.5 mg total) by mouth daily as needed for anxiety. Do not take at night 11/12/17  Yes Burns, Claudina Lick, MD  amLODipine (NORVASC) 2.5 MG tablet Take 1 tablet (2.5 mg total) by mouth daily. 10/13/17  Yes Burns, Claudina Lick, MD  aspirin EC 81 MG tablet Take 1 tablet (81 mg total) by mouth daily. 12/15/14  Yes [name], MD  Calcium Carb-Cholecalciferol (CALCIUM 600+D3) 600-400 MG-UNIT TABS Take 1 tablet by mouth daily.   Yes [provider]  Chloral Hydrate CRYS Take 10 mLs by mouth at bedtime as needed (Call  liquid into [facility]). Patient taking differently: Take 10 mLs by mouth at bedtime as needed (sleep). Call  liquid into [facility] 07/27/17  Yes [name], MD  Cholecalciferol (VITAMIN D-3) 1000 UNITS CAPS Take 1 capsule by mouth daily.    Yes [provider]  clindamycin (CLEOCIN) 300 MG capsule Take 600 mg by mouth as directed. Take 600 mg (2 capsules) by mouth one hour prior to dental procedures   Yes [provider]  Coenzyme Q10 (CO Q 10 PO) Take 1 capsule by mouth daily.   Yes [provider]  Magnesium 400 MG CAPS Take 400 mg by mouth every morning.    Yes [provider]  Ophthalmic Irrigation Solution (OCUSOFT EYE [place] OP) Apply 1 application to eye 2 (two) times daily as needed (dry eyes). Morning and before bed   Yes [provider]  Polyethyl Glycol-Propyl Glycol (SYSTANE ULTRA PF) 0.4-0.3 % SOLN Apply 1 drop to eye 2 (two) times daily.   Yes [provider]  rosuvastatin (CRESTOR) 10 MG tablet Take 1 tablet (10 mg total) by mouth daily. 10/15/17  Yes Burns, Claudina Lick, MD  Wheat Dextrin (BENEFIBER DRINK MIX PO) Take 5 mLs by mouth every morning. Mix with 1/2 a glass water, and takes with vitamins   Yes [provider]    Scheduled Meds: . bisacodyl  10 mg Rectal Once  . magnesium oxide  400 mg Oral Daily  . pantoprazole  40 mg Oral BID  . rosuvastatin  10 mg Oral Daily   Infusions: . sodium chloride 75 mL/hr at 12/14/17 1040   PRN Meds: acetaminophen **OR** acetaminophen, ALPRAZolam, ondansetron **OR** ondansetron (ZOFRAN) IV   Allergies as of 12/14/2017 - Review Complete 12/14/2017  Allergen Reaction Noted  . Amoxicillin-pot clavulanate Hives and Rash   . Penicillin g Rash 11/01/2015  .  Penicillins Hives and Rash 09/16/2011  . Sulfa antibiotics Rash   . Sulfamethoxazole Rash 11/01/2015  . Sulfonamide derivatives Hives     Family History  Problem Relation Age of Onset  . Hypertension Mother   . Rectal cancer Mother   . Colon cancer Mother   . Kidney disease Father     Social History   Socioeconomic History  . Marital status: Married    Spouse name: Not on file  . Number of children: 3  . Years of education: 16  . Highest education level: Bachelor's degree (e.g., BA, AB, BS)  Occupational History  . Occupation: Retired  Scientific laboratory technician  . Financial resource strain: Not hard at all  . Food insecurity:    Worry: Never true    Inability: Never true  . Transportation needs:    Medical: No    Non-medical: No  Tobacco Use  . Smoking status: Former Smoker    Packs/day: 0.50    Years: 30.00    Pack years: 15.00    Types: Cigarettes  . Smokeless tobacco: Never Used  . Tobacco comment: quit smoking 1980  Substance and Sexual Activity  . Alcohol use: Yes    Alcohol/week: 7.0 standard drinks    Types: 7 Glasses of wine per week    Comment: social  . Drug use: No  . Sexual activity: Not on file  Lifestyle  . Physical activity:    Days per week: 0 days    Minutes per session: 0 min  . Stress: Rather much  Relationships  . Social connections:    Talks on phone: More than three times a week    Gets together: More than three times a week    Attends religious  service: Not on file    Active member of club or organization: Not on file    Attends meetings of clubs or organizations: Not on file    Relationship status: Married  . Intimate partner violence:    Fear of current or ex partner: No    Emotionally abused: No    Physically abused: No    Forced sexual activity: No  Other Topics Concern  . Not on file  Social History Narrative   Lives at [facility] with her husband.   Right-handed.   Caffeine use: 3-4 cups per day (some tea, mixes coffee with decaf).    REVIEW OF SYSTEMS: Constitutional: Weakness, dizziness, presyncope which started yesterday evening.  Normally this is not a problem for her. ENT:  No nose bleeds Pulm: No shortness of breath or cough. CV:  No palpitations, no LE edema.  GU:  No hematuria, no frequency GI:  Per HPI Heme: Denies unusual or excessive bleeding or bruising. Transfusions: Has never been treated with blood transfusions.  Never needed to take iron or B12 supplements. Neuro:  No headaches, no peripheral tingling or numbness Derm:  No itching, no rash or sores.  Endocrine:  No sweats or chills.  No polyuria or dysuria Immunization: Reviewed. Travel: As per HPI, she did move from [place] back to [place] about 4 months ago.   PHYSICAL EXAM: Vital signs in last 24 hours: Vitals:   12/14/17 1230 12/14/17 1245  BP:    Pulse: 68 68  Resp: 17 16  Temp:    SpO2: 99% 100%   Wt Readings from Last 3 Encounters:  11/13/17 70.7 kg  10/19/17 70.5 kg  10/13/17 70.3 kg    General: Pleasant, well-appearing.  Looks younger than stated  age.  Comfortable.  No distress. Head: Facial asymmetry or swelling.  No signs of head trauma. Eyes: No scleral icterus.  No conjunctival pallor. Ears: Not hard of hearing Nose: No congestion or discharge.  EOMI. Mouth: Oropharynx moist, clear.  Good dental repair.  Tongue midline. Neck: No JVD, no masses, no thyromegaly. Lungs: Clear bilaterally.  No labored breathing,  cough, dyspnea. Heart: RRR.  Soft SEM and soft valve snap. Abdomen: Soft.  NT, ND.  No HSM, masses, bruits, hernias.  Bowel sounds are active..   Rectal: I did not perform the DRE.  There is a stool specimen in a closed container.  This is watery, black. Musc/Skeltl: No joint redness, swelling, gross deformity. Extremities: CCE. Neurologic: Alert.  Oriented x3.  Moves all 4 limbs, strength full.  No tremor.  No obvious neurologic deficits. Skin: No rashes, no sores, no significant bruising. Tattoos: None. Nodes: Cervical adenopathy. Psych: Operative, pleasant, calm.  Intake/Output from previous day: 08/18 0701 - 08/19 0700 In: 1000 [IV Piggyback:1000] Out: -  Intake/Output this shift: Total I/O In: -  Out: 400 [Urine:400]  LAB RESULTS: Recent Labs    12/14/17 0319 12/14/17 0354 12/14/17 0634  WBC 12.2*  --  7.6  HGB 11.0* 11.2* 9.4*  HCT 34.5* 33.0* 29.4*  PLT 158  --  161   BMET Lab Results  Component Value Date   NA 137 12/14/2017   NA 137 12/14/2017   NA 139 12/14/2017   K 4.8 12/14/2017   K 4.4 12/14/2017   K 4.4 12/14/2017   CL 110 12/14/2017   CL 104 12/14/2017   CL 106 12/14/2017   CO2 21 (L) 12/14/2017   CO2 24 12/14/2017   CO2 26 10/02/2017   GLUCOSE 118 (H) 12/14/2017   GLUCOSE 151 (H) 12/14/2017   GLUCOSE 151 (H) 12/14/2017   BUN 38 (H) 12/14/2017   BUN 41 (H) 12/14/2017   BUN 43 (H) 12/14/2017   CREATININE 0.76 12/14/2017   CREATININE 0.80 12/14/2017   CREATININE 0.91 12/14/2017   CALCIUM 8.0 (L) 12/14/2017   CALCIUM 8.6 (L) 12/14/2017   CALCIUM 9.8 10/02/2017   LFT Recent Labs    12/14/17 0319 12/14/17 0634  PROT 5.5* 4.7*  ALBUMIN 3.6 3.2*  AST 25 28  ALT 16 12  ALKPHOS 62 62  BILITOT 0.6 0.5   PT/INR Lab Results  Component Value Date   INR 1.73 (H) 11/16/2014   INR 1.07 11/14/2014   INR 1.0 10/10/2014   Hepatitis Panel No results for input(s): HEPBSAG, HCVAB, HEPAIGM, HEPBIGM in the last 72 hours. C-Diff No components  found for: CDIFF Lipase  No results found for: LIPASE  Drugs of Abuse  No results found for: LABOPIA, COCAINSCRNUR, LABBENZ, AMPHETMU, THCU, LABBARB   RADIOLOGY STUDIES: Dg Chest 2 View  Result Date: 12/14/2017 CLINICAL DATA:  Initial evaluation for generalized weakness. EXAM: CHEST - 2 VIEW COMPARISON:  Prior radiograph from 12/20/2014. FINDINGS: Median sternotomy wires with underlying surgical clips noted. Prosthetic valve noted. Cardiomegaly unchanged. Tortuosity the intrathoracic aorta noted. Mediastinal silhouette normal. Lungs normally inflated. No focal infiltrates. No pulmonary edema or pleural effusion. No pneumothorax. No acute osseus abnormality. IMPRESSION: 1. No active cardiopulmonary disease. 2. Mild cardiomegaly with aortic valvular prosthesis. Electronically Signed   By: Jeannine Boga M.D.   On: 12/14/2017 02:58     IMPRESSION:   *   GI bleed. Suspect recurrent ulcer disease as she has not been taking any ulcer preventative medications.  *   Blood  loss anemia, symptomatic.  *    Azotemia, suspect this is due to blood in the GI tract.  *   S/P tissue aortic valve replacement.  Not on blood thinners.  Tells me she is not taking baby aspirin.   PLAN:     *   Repeat hemoglobins/hematocrit along with PT/INR later this afternoon.  *   9:15 AM EGD.  Can have clears tonight.  BID PO Protonix ordered and started this AM.    Azucena Freed  12/14/2017, 1:15 PM Phone [phone]

## 2017-12-14 NOTE — H&P (Signed)
History and Physical    [name] DJS:[id number] DOB: 10/05/1932 DOA: 12/14/2017  PCP: [name], MD  Patient coming from: [facility] living facility.  Chief Complaint: Weakness.  Dizziness.  HPI: [name] is a 84 y.o. female with history of bioprosthetic aortic valve replacement 3 years ago presents to the ER after patient had a spell of dizziness with weakness and diaphoresis.  Patient states she was doing well yesterday and had walked around with no difficulty.  At around bedtime patient started feeling uneasy and weak.  Patient got her blood pressure checked by the nurse.  It was low normal as per the patient.  Did not have any chest pain.  Later when she went to the bathroom to get her teeth brushed she felt the same symptoms again became more diaphoretic and was brought to the ER.  Patient states she had recently moved twice.  The first time was from [place] to [place] 2 months ago and in [place] she moved from her condo to the independent living facility.  A month ago she was found to be having high double blood pressure and was placed on low-dose amlodipine.  Also was started on statins.  ED Course: In the ER patient was found to be low normal blood pressure and was given fluid bolus.  EKG was showing diffuse T wave changes and cardiology was notified.  Chest x-ray unremarkable.  Patient admitted for further management of near syncope with diaphoresis.  Patient says he had a similar diaphoretic episode 3 months ago in [place].  At that time patient was found to be bradycardic.  Review of Systems: As per HPI, rest all negative.   Past Medical History:  Diagnosis Date  . Aortic valve stenosis, severe   . Breast cancer ([place])    right  . Hiatal hernia   . Hypercholesteremia   . Hypoglycemia   . Osteopenia   . Tingling   . Ulcer 2012   bleeding gastric Ulcer    Past Surgical History:  Procedure Laterality Date  . AORTIC VALVE REPLACEMENT N/A  11/16/2014   Procedure: AORTIC VALVE REPLACEMENT (AVR);  Surgeon: Gaye Pollack, MD;  Location: [facility];  Service: Open Heart Surgery;  Laterality: N/A;  . BREAST SURGERY    . CARDIAC CATHETERIZATION N/A 10/18/2014   Procedure: Right/Left Heart Cath and Coronary Angiography;  Surgeon: [name], MD;  Location: [facility];  Service: Cardiovascular;  Laterality: N/A;  . COSMETIC SURGERY     face  . PARATHYROIDECTOMY    . TEE WITHOUT CARDIOVERSION N/A 11/16/2014   Procedure: TRANSESOPHAGEAL ECHOCARDIOGRAM (TEE);  Surgeon: Gaye Pollack, MD;  Location: [facility];  Service: Open Heart Surgery;  Laterality: N/A;  . TONSILLECTOMY       reports that she has quit smoking. Her smoking use included cigarettes. She has a 15.00 pack-year smoking history. She has never used smokeless tobacco. She reports that she drinks about 7.0 standard drinks of alcohol per week. She reports that she does not use drugs.  Allergies  Allergen Reactions  . Amoxicillin-Pot Clavulanate Hives and Rash  . Penicillin G Rash  . Penicillins Hives and Rash  . Sulfa Antibiotics Rash  . Sulfamethoxazole Rash  . Sulfonamide Derivatives Hives    Family History  Problem Relation Age of Onset  . Hypertension Mother   . Rectal cancer Mother   . Colon cancer Mother   . Kidney disease Father     Prior to Admission medications  Medication Sig Start Date End Date Taking? Authorizing Provider  acetaminophen (TYLENOL) 500 MG tablet Take 500-1,000 mg by mouth at bedtime as needed for mild pain or moderate pain.   Yes [provider]  ALPRAZolam Duanne Moron) 0.5 MG tablet Take 1 tablet (0.5 mg total) by mouth daily as needed for anxiety. Do not take at night 11/12/17  Yes Burns, Claudina Lick, MD  amLODipine (NORVASC) 2.5 MG tablet Take 1 tablet (2.5 mg total) by mouth daily. 10/13/17  Yes Burns, Claudina Lick, MD  aspirin EC 81 MG tablet Take 1 tablet (81 mg total) by mouth daily. 12/15/14  Yes [name], MD  Calcium  Carb-Cholecalciferol (CALCIUM 600+D3) 600-400 MG-UNIT TABS Take 1 tablet by mouth daily.   Yes [provider]  Chloral Hydrate CRYS Take 10 mLs by mouth at bedtime as needed (Call  liquid into [facility]). Patient taking differently: Take 10 mLs by mouth at bedtime as needed (sleep). Call  liquid into [facility] 07/27/17  Yes [name], MD  Cholecalciferol (VITAMIN D-3) 1000 UNITS CAPS Take 1 capsule by mouth daily.    Yes [provider]  clindamycin (CLEOCIN) 300 MG capsule Take 600 mg by mouth as directed. Take 600 mg (2 capsules) by mouth one hour prior to dental procedures   Yes [provider]  Coenzyme Q10 (CO Q 10 PO) Take 1 capsule by mouth daily.   Yes [provider]  Magnesium 400 MG CAPS Take 400 mg by mouth every morning.    Yes [provider]  Ophthalmic Irrigation Solution (OCUSOFT EYE [place] OP) Apply 1 application to eye 2 (two) times daily as needed (dry eyes). Morning and before bed   Yes [provider]  Polyethyl Glycol-Propyl Glycol (SYSTANE ULTRA PF) 0.4-0.3 % SOLN Apply 1 drop to eye 2 (two) times daily.   Yes [provider]  rosuvastatin (CRESTOR) 10 MG tablet Take 1 tablet (10 mg total) by mouth daily. 10/15/17  Yes Burns, Claudina Lick, MD  Wheat Dextrin (BENEFIBER DRINK MIX PO) Take 5 mLs by mouth every morning. Mix with 1/2 a glass water, and takes with vitamins   Yes [provider]    Physical Exam: Vitals:   12/14/17 0210 12/14/17 0213 12/14/17 0245 12/14/17 0400  BP:  (!) 112/56 105/74 106/87  Pulse:  71 62 63  Resp:  18 19 14   Temp:  (!) 97.5 F (36.4 C)    TempSrc:  Oral    SpO2: 96% 100% 98% 100%      Constitutional: Moderately built and nourished. Vitals:   12/14/17 0210 12/14/17 0213 12/14/17 0245 12/14/17 0400  BP:  (!) 112/56 105/74 106/87  Pulse:  71 62 63  Resp:  18 19 14   Temp:  (!) 97.5 F (36.4 C)    TempSrc:  Oral    SpO2: 96% 100%  98% 100%   Eyes: Anicteric no pallor. ENMT: No discharge from the ears eyes nose or mouth. Neck: No mass felt.  No neck rigidity.  No JVD appreciated. Respiratory: No rhonchi or crepitations. Cardiovascular: S1-S2 heard no murmurs appreciated. Abdomen: Soft nontender bowel sounds present. Musculoskeletal: No edema.  No joint effusion. Skin: No rash.  Skin appears warm. Neurologic: Alert awake oriented to time place and person.  Moves all extremities. Psychiatric: Appears normal.  Normal affect.   Labs on Admission: I have personally reviewed following labs and imaging studies  CBC: Recent Labs  Lab 12/14/17 0319 12/14/17 0354  WBC 12.2*  --   NEUTROABS 9.8*  --   HGB 11.0* 11.2*  HCT 34.5* 33.0*  MCV 100.6*  --   PLT 158  --    Basic Metabolic Panel: Recent Labs  Lab 12/14/17 0319 12/14/17 0354  NA 139 137  K 4.4 4.4  CL 106 104  CO2 24  --   GLUCOSE 151* 151*  BUN 43* 41*  CREATININE 0.91 0.80  CALCIUM 8.6*  --   MG 1.8  --    GFR: CrCl cannot be calculated (Unknown ideal weight.). Liver Function Tests: Recent Labs  Lab 12/14/17 0319  AST 25  ALT 16  ALKPHOS 62  BILITOT 0.6  PROT 5.5*  ALBUMIN 3.6   No results for input(s): LIPASE, AMYLASE in the last 168 hours. No results for input(s): AMMONIA in the last 168 hours. Coagulation Profile: No results for input(s): INR, PROTIME in the last 168 hours. Cardiac Enzymes: Recent Labs  Lab 12/14/17 0319  TROPONINI <0.03   BNP (last 3 results) No results for input(s): PROBNP in the last 8760 hours. HbA1C: No results for input(s): HGBA1C in the last 72 hours. CBG: No results for input(s): GLUCAP in the last 168 hours. Lipid Profile: No results for input(s): CHOL, HDL, LDLCALC, TRIG, CHOLHDL, LDLDIRECT in the last 72 hours. Thyroid Function Tests: No results for input(s): TSH, T4TOTAL, FREET4, T3FREE, THYROIDAB in the last 72 hours. Anemia Panel: No results for input(s): VITAMINB12, FOLATE, FERRITIN,  TIBC, IRON, RETICCTPCT in the last 72 hours. Urine analysis:    Component Value Date/Time   COLORURINE YELLOW 02/21/2015 0913   APPEARANCEUR CLEAR 02/21/2015 0913   LABSPEC 1.010 02/21/2015 0913   PHURINE 7.0 02/21/2015 0913   GLUCOSEU NEGATIVE 02/21/2015 0913   HGBUR SMALL (A) 02/21/2015 0913   BILIRUBINUR NEGATIVE 02/21/2015 0913   BILIRUBINUR n 11/29/2012 1633   KETONESUR NEGATIVE 02/21/2015 0913   PROTEINUR NEGATIVE 11/14/2014 1630   UROBILINOGEN 0.2 02/21/2015 0913   NITRITE NEGATIVE 02/21/2015 0913   LEUKOCYTESUR NEGATIVE 02/21/2015 0913   Sepsis Labs: @LABRCNTIP (procalcitonin:4,lacticidven:4) )No results found for this or any previous visit (from the past 240 hour(s)).   Radiological Exams on Admission: Dg Chest 2 View  Result Date: 12/14/2017 CLINICAL DATA:  Initial evaluation for generalized weakness. EXAM: CHEST - 2 VIEW COMPARISON:  Prior radiograph from 12/20/2014. FINDINGS: Median sternotomy wires with underlying surgical clips noted. Prosthetic valve noted. Cardiomegaly unchanged. Tortuosity the intrathoracic aorta noted. Mediastinal silhouette normal. Lungs normally inflated. No focal infiltrates. No pulmonary edema or pleural effusion. No pneumothorax. No acute osseus abnormality. IMPRESSION: 1. No active cardiopulmonary disease. 2. Mild cardiomegaly with aortic valvular prosthesis. Electronically Signed   By: Jeannine Boga M.D.   On: 12/14/2017 02:58    EKG: Independently reviewed.  Normal sinus rhythm with diffuse T wave changes.  Assessment/Plan Active Problems:   History of right breast cancer   S/P AVR   Syncope   Macrocytic anemia   Near syncope    1. Near syncopal episode with diaphoresis with history of aortic valve replacement we will closely monitor telemetry.  Hold antihypertensive continue to hydrate.  Check cardiac markers.  Cardiology was notified by the ER physician. 2. Hyperlipidemia on statins. 3. Macrocytic anemia will check anemia  panel follow CBC. 4. Hyperglycemia -hemoglobin A1c on October 02, 2016 was 5.9.   DVT prophylaxis: Lovenox. Code Status: Full code. Family Communication: Discussed with patient. Disposition Plan: Home. Consults called: Cardiology was notified. Admission status: Observation.   Rise Patience MD  [facility] Pager [phone].  If 7PM-7AM, please contact night-coverage [email or web address] Password [facility]  12/14/2017, 4:57 AM

## 2017-12-14 NOTE — ED Notes (Signed)
Pt reports having blood in her stool, MD paged and made aware

## 2017-12-14 NOTE — ED Notes (Signed)
Pt ambulatory to the restroom without difficulty. Gait steady and even.

## 2017-12-14 NOTE — Progress Notes (Signed)
Patient was seen and admitted earlier by Dr. Jaquita Rector, for near syncope, I have seen and examined the patient, near-syncope most likely in the setting of symptomatic anemia, hemoglobin dropped from 14-9.6, Hemoccult positive stool, with known history of gastric ulcer disease, seen by GI, plan for endoscopy in a.m., known history of aortic valve replacement surgery 3 years ago, seen by cardiology, plan to repeat echo in the morning.  Alert x3, sitting in bed in no apparent distress Data entry bilaterally, no wheezing rales rhonchi Mild epigastric tenderness, bowel sounds present Regular rate and rhythm, no rubs or gallops Extremities with no edema, clubbing or cyanosis  Phillips Climes MD

## 2017-12-14 NOTE — ED Notes (Signed)
Pt alert and with family @ bedside Call light within reach VS documented

## 2017-12-14 NOTE — Telephone Encounter (Signed)
Noted  

## 2017-12-14 NOTE — ED Notes (Signed)
Patient transported to X-ray 

## 2017-12-14 NOTE — ED Provider Notes (Signed)
[facility] EMERGENCY DEPARTMENT Provider Note   CSN: [id number] Arrival date & time: 12/14/17  0159     History   Chief Complaint Chief Complaint  Patient presents with  . Near Syncope  . Hypotension    HPI [name] is a 84 y.o. female.  HPI 84 year old female with history of aortic valve stenosis, hypertension, hyperlipidemia, status post aortic valve replacement, here with near syncopal episode.  The patient states she was fine throughout the day today.  She is able to walk around her neighborhood without difficulty.  When she was getting ready for bed, she had been standing for some time, then began to feel acutely very weak.  She felt flushed and broke out in a cold sweat.  She felt nauseous.  She sat down and initially felt better, then stood up and was trying to pressure teeth when her symptoms returned.  She states she took her blood pressure and it was in the low 100s, which is low for her.  Denies any recent medication change.  She does not have any chest pain, that she has never had chest pain.  She denies any recent changes in her medications.  Denies any recent fevers, chills, or other illness.  She is been eating and drinking normally.  She currently feels mildly weak, but otherwise is without complaints.  Past Medical History:  Diagnosis Date  . Aortic valve stenosis, severe   . Breast cancer ([place])    right  . Hiatal hernia   . Hypercholesteremia   . Hypoglycemia   . Osteopenia   . Tingling   . Ulcer 2012   bleeding gastric Ulcer    Patient Active Problem List   Diagnosis Date Noted  . Left sided numbness 10/19/2017  . Small vessel disease, cerebrovascular 10/19/2017  . Numbness and tingling of left arm and leg 10/13/2017  . Anxiety 10/02/2017  . Right ear pain 09/04/2017  . Prediabetes 09/29/2016  . Lung nodule < 6cm on CT 09/29/2016  . Leg cramps 01/29/2016  . Insomnia 09/12/2015  . Rectal bleed 09/12/2015  . S/P AVR  11/16/2014  . Branch retinal vein occlusion 10/12/2013  . Aortic valve stenosis, severe   . ALLERGIC RHINITIS DUE TO OTHER ALLERGEN 09/10/2009  . Hyperlipidemia 11/30/2006  . HYPOGLYCEMIA NEC 11/20/2006  . MITRAL VALVE PROLAPSE 10/29/2006  . Osteopenia 10/29/2006  . History of right breast cancer 10/29/2006    Past Surgical History:  Procedure Laterality Date  . AORTIC VALVE REPLACEMENT N/A 11/16/2014   Procedure: AORTIC VALVE REPLACEMENT (AVR);  Surgeon: Gaye Pollack, MD;  Location: [facility];  Service: Open Heart Surgery;  Laterality: N/A;  . BREAST SURGERY    . CARDIAC CATHETERIZATION N/A 10/18/2014   Procedure: Right/Left Heart Cath and Coronary Angiography;  Surgeon: [name], MD;  Location: [facility];  Service: Cardiovascular;  Laterality: N/A;  . COSMETIC SURGERY     face  . PARATHYROIDECTOMY    . TEE WITHOUT CARDIOVERSION N/A 11/16/2014   Procedure: TRANSESOPHAGEAL ECHOCARDIOGRAM (TEE);  Surgeon: Gaye Pollack, MD;  Location: [facility];  Service: Open Heart Surgery;  Laterality: N/A;  . TONSILLECTOMY       OB History   None      Home Medications    Prior to Admission medications   Medication Sig Start Date End Date Taking? Authorizing Provider  acetaminophen (TYLENOL) 500 MG tablet Take 500-1,000 mg by mouth at bedtime as needed for mild pain or moderate pain.  [provider]  ALPRAZolam Duanne Moron) 0.5 MG tablet Take 1 tablet (0.5 mg total) by mouth daily as needed for anxiety. Do not take at night 11/12/17   [name], MD  amLODipine (NORVASC) 2.5 MG tablet Take 1 tablet (2.5 mg total) by mouth daily. 10/13/17   [name], MD  aspirin EC 81 MG tablet Take 1 tablet (81 mg total) by mouth daily. Patient taking differently: 81 mg. Taking four tablets daily. 12/15/14   [name], MD  Calcium Carb-Cholecalciferol (CALCIUM 600+D3) 600-400 MG-UNIT TABS Take 1 tablet by mouth daily.    [provider]  Chloral Hydrate CRYS Take 10 mLs  by mouth at bedtime as needed (Call  liquid into [facility]). 07/27/17   [name], MD  Cholecalciferol (VITAMIN D-3) 1000 UNITS CAPS Take 1 capsule by mouth daily.     [provider]  clindamycin (CLEOCIN) 300 MG capsule Take 600 mg by mouth as directed. Take 600 mg (2 capsules) by mouth one hour prior to dental procedures    [provider]  Coenzyme Q10 (CO Q 10 PO) Take 1 capsule by mouth daily.    [provider]  Magnesium 400 MG CAPS Take 400 mg by mouth every morning.     [provider]  Ophthalmic Irrigation Solution (OCUSOFT EYE [place] OP) Apply 1 application to eye 2 (two) times daily as needed (dry eyes). Morning and before bed    [provider]  Polyethyl Glycol-Propyl Glycol (SYSTANE ULTRA PF) 0.4-0.3 % SOLN Apply 1 drop to eye 2 (two) times daily.    [provider]  rosuvastatin (CRESTOR) 10 MG tablet Take 1 tablet (10 mg total) by mouth daily. 10/15/17   Burns, Claudina Lick, MD  Wheat Dextrin (BENEFIBER DRINK MIX PO) Take 5 mLs by mouth every morning. Mix with 1/2 a glass water, and takes with vitamins    [provider]    Family History Family History  Problem Relation Age of Onset  . Hypertension Mother   . Rectal cancer Mother   . Colon cancer Mother   . Kidney disease Father     Social History Social History   Tobacco Use  . Smoking status: Former Smoker    Packs/day: 0.50    Years: 30.00    Pack years: 15.00    Types: Cigarettes  . Smokeless tobacco: Never Used  . Tobacco comment: quit smoking 1980  Substance Use Topics  . Alcohol use: Yes    Alcohol/week: 7.0 standard drinks    Types: 7 Glasses of wine per week    Comment: social  . Drug use: No     Allergies   Amoxicillin-pot clavulanate; Penicillin g; Penicillins; Sulfa antibiotics; Sulfamethoxazole; and Sulfonamide derivatives   Review of Systems Review of Systems  Constitutional: Positive for diaphoresis and fatigue.  Negative for chills and fever.  HENT: Negative for congestion and rhinorrhea.   Eyes: Negative for visual disturbance.  Respiratory: Negative for cough, shortness of breath and wheezing.   Cardiovascular: Negative for chest pain and leg swelling.  Gastrointestinal: Positive for nausea. Negative for abdominal pain, diarrhea and vomiting.  Genitourinary: Negative for dysuria and flank pain.  Musculoskeletal: Negative for neck pain and neck stiffness.  Skin: Negative for rash and wound.  Allergic/Immunologic: Negative for immunocompromised state.  Neurological: Positive for weakness and light-headedness. Negative for syncope and headaches.  All other systems reviewed and are negative.    Physical Exam Updated Vital Signs BP Marland Kitchen)  112/56   Pulse 71   Temp (!) 97.5 F (36.4 C) (Oral)   Resp 18   SpO2 100%   Physical Exam  Constitutional: She is oriented to person, place, and time. She appears well-developed and well-nourished. No distress.  HENT:  Head: Normocephalic and atraumatic.  Dry mucous membranes  Eyes: Conjunctivae are normal.  Neck: Neck supple.  Cardiovascular: Normal rate and regular rhythm. Exam reveals no friction rub.  Murmur heard.  Systolic murmur is present. Pulmonary/Chest: Effort normal and breath sounds normal. No respiratory distress. She has no wheezes. She has no rales.  Abdominal: She exhibits no distension.  Musculoskeletal: She exhibits no edema.  Neurological: She is alert and oriented to person, place, and time. She exhibits normal muscle tone.  Skin: Skin is warm. Capillary refill takes less than 2 seconds.  Psychiatric: She has a normal mood and affect.  Nursing note and vitals reviewed.    ED Treatments / Results  Labs (all labs ordered are listed, but only abnormal results are displayed) Labs Reviewed  CBC WITH DIFFERENTIAL/PLATELET - Abnormal; Notable for the following components:      Result Value   WBC 12.2 (*)    RBC 3.43 (*)     Hemoglobin 11.0 (*)    HCT 34.5 (*)    MCV 100.6 (*)    Neutro Abs 9.8 (*)    All other components within normal limits  COMPREHENSIVE METABOLIC PANEL - Abnormal; Notable for the following components:   Glucose, Bld 151 (*)    BUN 43 (*)    Calcium 8.6 (*)    Total Protein 5.5 (*)    GFR calc non Af Amer 57 (*)    All other components within normal limits  I-STAT CHEM 8, ED - Abnormal; Notable for the following components:   BUN 41 (*)    Glucose, Bld 151 (*)    Calcium, Ion 1.11 (*)    Hemoglobin 11.2 (*)    HCT 33.0 (*)    All other components within normal limits  TROPONIN I  MAGNESIUM  I-STAT TROPONIN, ED    EKG EKG Interpretation  Date/Time:  Monday December 14 2017 02:15:49 EDT Ventricular Rate:  63 PR Interval:    QRS Duration: 92 QT Interval:  452 QTC Calculation: 463 R Axis:   12 Text Interpretation:  Sinus rhythm Abnormal R-wave progression, early transition Abnormal T, consider ischemia, diffuse leads Since last EKG, TWI in lateral and precordial leads appears new Confirmed by Duffy Bruce [phone]) on 12/14/2017 2:25:30 AM   Radiology Dg Chest 2 View  Result Date: 12/14/2017 CLINICAL DATA:  Initial evaluation for generalized weakness. EXAM: CHEST - 2 VIEW COMPARISON:  Prior radiograph from 12/20/2014. FINDINGS: Median sternotomy wires with underlying surgical clips noted. Prosthetic valve noted. Cardiomegaly unchanged. Tortuosity the intrathoracic aorta noted. Mediastinal silhouette normal. Lungs normally inflated. No focal infiltrates. No pulmonary edema or pleural effusion. No pneumothorax. No acute osseus abnormality. IMPRESSION: 1. No active cardiopulmonary disease. 2. Mild cardiomegaly with aortic valvular prosthesis. Electronically Signed   By: Jeannine Boga M.D.   On: 12/14/2017 02:58    Procedures Procedures (including critical care time)  Medications Ordered in ED Medications  sodium chloride 0.9 % bolus 1,000 mL (has no administration in time  range)     Initial Impression / Assessment and Plan / ED Course  I have reviewed the triage vital signs and the nursing notes.  Pertinent labs & imaging results that were available during my care of the  patient were reviewed by me and considered in my medical decision making (see chart for details).    84 year old female here with near syncopal episode with diaphoresis.  Her lab work today is overall reassuring.  She does have a moderately elevated BUN which could suggest dehydration, though she is not overtly orthostatic here.  Her EKG today shows new T wave inversions.  There are no ST elevations or depressions.  However, discussed with cardiology fellow on-call regarding her EKG changes in the setting of her valvular replacement followed by Dr. Burt Knack, will admit for observation and telemetry.  Final Clinical Impressions(s) / ED Diagnoses   Final diagnoses:  Near syncope    ED Discharge Orders    None       Duffy Bruce, MD 12/14/17 [phone]

## 2017-12-14 NOTE — ED Notes (Signed)
Pt ambulate to bathroom 

## 2017-12-14 NOTE — Consult Note (Signed)
[facility] Consult: 1:15 PM 12/14/2017  LOS: 0 days    Referring Provider: Dr. Waldron Labs Primary Care Physician:  [name], MD Primary Gastroenterologist:  Dr. Hilarie Fredrickson.      Reason for Consultation: Anemia   HPI: [name] is a 84 y.o. female.  Hx: s/p AV replacement, Edwards magna ease pericardial valve 10/2014.   Breast cancer, lumpectomy in 2005.  2009 colonoscopy.  Dr. Henrene Pastor.  Showed incidental pneumatosis intestinalis cystoidis.   GIB with melena and antral gastric ulcer on EGD 11/2010, pathology showed reactive gastropathy.  Stained negative for H. pylori.  No dysplasia, no malignancy..   Follow-up EGD in 01/2011 showed ulcer still present. 08/2011 EGD to follow-up history gastric ulcer.  Ulcer site gastric antrum healed but lingering residual erythema was biopsied.  Small hiatal hernia.  Biopsy showed benign antral mucosa with minimal chronic inflammation.  No H. pylori, dysplasia, malignancy. 08/2015 Sigmoidoscopy in [place].  Internal hemorroids.    Late yesterday evening the patient developed dizziness and presyncope.  At around 11 PM she had a large, watery, dark, but not obviously bloody, BM.  This morning she also felt dizzy, presyncopal, diaphoretic and this time when she passed the stool it was dark and she saw what looked like some blood coming out of the stool.  She said some nonspecific vague discomfort in her lower abdomen associated with the bowel movement yesterday and this morning.  She has not had any epigastric pain.  She has a good appetite, generally no reflux symptoms.  She does not take any PPI or H2 blocker, nor does she take aspirin (81 ASA listed but pt says she is not taking) or NSAIDs. She attributes what is possibly an ulcer to the and her husband having moved 3 times  in the last 4 months.  The first move was from [place] back to their condominium in [place], then they moved out of their condominium in [place] and into independent living at [facility]. Patient was transported by EMS to the ED. BPs at arrival as 99/45.  Pulse consistently in the 60s.  With IV fluids her blood pressure now in the 130s over 50s.  She is oxygenating well on room air.  EKG showed diffuse T wave changes.  Cardiology has been notified but troponin I negative x2 a third, point-of-care, troponin also negative.  She has not had any chest pain or perceived any palpitations. Hgb ~ 14 in 09/2017 Today Hgb 11 >> 9.4.   BUN elevated at 43 >> 38.   No evidence of iron deficiency. Coags have not been obtained.  Patient lives with her husband at [place].  She drinks a small glass of wine with dinner every evening.  Past Medical History:  Diagnosis Date  . Aortic valve stenosis, severe   . Breast cancer ([place])    right  . Hiatal hernia   . Hypercholesteremia   . Hypoglycemia   . Osteopenia   . Tingling   . Ulcer 2012   bleeding gastric Ulcer    Past Surgical History:  Procedure  Laterality Date  . AORTIC VALVE REPLACEMENT N/A 11/16/2014   Procedure: AORTIC VALVE REPLACEMENT (AVR);  Surgeon: Gaye Pollack, MD;  Location: [facility];  Service: Open Heart Surgery;  Laterality: N/A;  . BREAST SURGERY    . CARDIAC CATHETERIZATION N/A 10/18/2014   Procedure: Right/Left Heart Cath and Coronary Angiography;  Surgeon: [name], MD;  Location: [facility];  Service: Cardiovascular;  Laterality: N/A;  . COSMETIC SURGERY     face  . PARATHYROIDECTOMY    . TEE WITHOUT CARDIOVERSION N/A 11/16/2014   Procedure: TRANSESOPHAGEAL ECHOCARDIOGRAM (TEE);  Surgeon: Gaye Pollack, MD;  Location: [facility];  Service: Open Heart Surgery;  Laterality: N/A;  . TONSILLECTOMY      Prior to Admission medications   Medication Sig Start Date End Date Taking? Authorizing Provider  acetaminophen  (TYLENOL) 500 MG tablet Take 500-1,000 mg by mouth at bedtime as needed for mild pain or moderate pain.   Yes [provider]  ALPRAZolam Duanne Moron) 0.5 MG tablet Take 1 tablet (0.5 mg total) by mouth daily as needed for anxiety. Do not take at night 11/12/17  Yes Burns, Claudina Lick, MD  amLODipine (NORVASC) 2.5 MG tablet Take 1 tablet (2.5 mg total) by mouth daily. 10/13/17  Yes Burns, Claudina Lick, MD  aspirin EC 81 MG tablet Take 1 tablet (81 mg total) by mouth daily. 12/15/14  Yes [name], MD  Calcium Carb-Cholecalciferol (CALCIUM 600+D3) 600-400 MG-UNIT TABS Take 1 tablet by mouth daily.   Yes [provider]  Chloral Hydrate CRYS Take 10 mLs by mouth at bedtime as needed (Call  liquid into [facility]). Patient taking differently: Take 10 mLs by mouth at bedtime as needed (sleep). Call  liquid into [facility] 07/27/17  Yes [name], MD  Cholecalciferol (VITAMIN D-3) 1000 UNITS CAPS Take 1 capsule by mouth daily.    Yes [provider]  clindamycin (CLEOCIN) 300 MG capsule Take 600 mg by mouth as directed. Take 600 mg (2 capsules) by mouth one hour prior to dental procedures   Yes [provider]  Coenzyme Q10 (CO Q 10 PO) Take 1 capsule by mouth daily.   Yes [provider]  Magnesium 400 MG CAPS Take 400 mg by mouth every morning.    Yes [provider]  Ophthalmic Irrigation Solution (OCUSOFT EYE [place] OP) Apply 1 application to eye 2 (two) times daily as needed (dry eyes). Morning and before bed   Yes [provider]  Polyethyl Glycol-Propyl Glycol (SYSTANE ULTRA PF) 0.4-0.3 % SOLN Apply 1 drop to eye 2 (two) times daily.   Yes [provider]  rosuvastatin (CRESTOR) 10 MG tablet Take 1 tablet (10 mg total) by mouth daily. 10/15/17  Yes Burns, Claudina Lick, MD  Wheat Dextrin (BENEFIBER DRINK MIX PO) Take 5 mLs by mouth every morning. Mix with 1/2 a glass water, and takes with vitamins   Yes [provider]    Scheduled Meds: . bisacodyl  10 mg Rectal Once  . magnesium oxide  400 mg Oral Daily  . pantoprazole  40 mg Oral BID  . rosuvastatin  10 mg Oral Daily   Infusions: . sodium chloride 75 mL/hr at 12/14/17 1040   PRN Meds: acetaminophen **OR** acetaminophen, ALPRAZolam, ondansetron **OR** ondansetron (ZOFRAN) IV   Allergies as of 12/14/2017 - Review Complete 12/14/2017  Allergen Reaction Noted  . Amoxicillin-pot clavulanate Hives and Rash   . Penicillin g Rash 11/01/2015  .  Penicillins Hives and Rash 09/16/2011  . Sulfa antibiotics Rash   . Sulfamethoxazole Rash 11/01/2015  . Sulfonamide derivatives Hives     Family History  Problem Relation Age of Onset  . Hypertension Mother   . Rectal cancer Mother   . Colon cancer Mother   . Kidney disease Father     Social History   Socioeconomic History  . Marital status: Married    Spouse name: Not on file  . Number of children: 3  . Years of education: 16  . Highest education level: Bachelor's degree (e.g., BA, AB, BS)  Occupational History  . Occupation: Retired  Scientific laboratory technician  . Financial resource strain: Not hard at all  . Food insecurity:    Worry: Never true    Inability: Never true  . Transportation needs:    Medical: No    Non-medical: No  Tobacco Use  . Smoking status: Former Smoker    Packs/day: 0.50    Years: 30.00    Pack years: 15.00    Types: Cigarettes  . Smokeless tobacco: Never Used  . Tobacco comment: quit smoking 1980  Substance and Sexual Activity  . Alcohol use: Yes    Alcohol/week: 7.0 standard drinks    Types: 7 Glasses of wine per week    Comment: social  . Drug use: No  . Sexual activity: Not on file  Lifestyle  . Physical activity:    Days per week: 0 days    Minutes per session: 0 min  . Stress: Rather much  Relationships  . Social connections:    Talks on phone: More than three times a week    Gets together: More than three times a week    Attends religious  service: Not on file    Active member of club or organization: Not on file    Attends meetings of clubs or organizations: Not on file    Relationship status: Married  . Intimate partner violence:    Fear of current or ex partner: No    Emotionally abused: No    Physically abused: No    Forced sexual activity: No  Other Topics Concern  . Not on file  Social History Narrative   Lives at [facility] with her husband.   Right-handed.   Caffeine use: 3-4 cups per day (some tea, mixes coffee with decaf).    REVIEW OF SYSTEMS: Constitutional: Weakness, dizziness, presyncope which started yesterday evening.  Normally this is not a problem for her. ENT:  No nose bleeds Pulm: No shortness of breath or cough. CV:  No palpitations, no LE edema.  GU:  No hematuria, no frequency GI:  Per HPI Heme: Denies unusual or excessive bleeding or bruising. Transfusions: Has never been treated with blood transfusions.  Never needed to take iron or B12 supplements. Neuro:  No headaches, no peripheral tingling or numbness Derm:  No itching, no rash or sores.  Endocrine:  No sweats or chills.  No polyuria or dysuria Immunization: Reviewed. Travel: As per HPI, she did move from [place] back to [place] about 4 months ago.   PHYSICAL EXAM: Vital signs in last 24 hours: Vitals:   12/14/17 1230 12/14/17 1245  BP:    Pulse: 68 68  Resp: 17 16  Temp:    SpO2: 99% 100%   Wt Readings from Last 3 Encounters:  11/13/17 70.7 kg  10/19/17 70.5 kg  10/13/17 70.3 kg    General: Pleasant, well-appearing.  Looks younger than stated  age.  Comfortable.  No distress. Head: Facial asymmetry or swelling.  No signs of head trauma. Eyes: No scleral icterus.  No conjunctival pallor. Ears: Not hard of hearing Nose: No congestion or discharge.  EOMI. Mouth: Oropharynx moist, clear.  Good dental repair.  Tongue midline. Neck: No JVD, no masses, no thyromegaly. Lungs: Clear bilaterally.  No labored breathing,  cough, dyspnea. Heart: RRR.  Soft SEM and soft valve snap. Abdomen: Soft.  NT, ND.  No HSM, masses, bruits, hernias.  Bowel sounds are active..   Rectal: I did not perform the DRE.  There is a stool specimen in a closed container.  This is watery, black. Musc/Skeltl: No joint redness, swelling, gross deformity. Extremities: CCE. Neurologic: Alert.  Oriented x3.  Moves all 4 limbs, strength full.  No tremor.  No obvious neurologic deficits. Skin: No rashes, no sores, no significant bruising. Tattoos: None. Nodes: Cervical adenopathy. Psych: Operative, pleasant, calm.  Intake/Output from previous day: 08/18 0701 - 08/19 0700 In: 1000 [IV Piggyback:1000] Out: -  Intake/Output this shift: Total I/O In: -  Out: 400 [Urine:400]  LAB RESULTS: Recent Labs    12/14/17 0319 12/14/17 0354 12/14/17 0634  WBC 12.2*  --  7.6  HGB 11.0* 11.2* 9.4*  HCT 34.5* 33.0* 29.4*  PLT 158  --  161   BMET Lab Results  Component Value Date   NA 137 12/14/2017   NA 137 12/14/2017   NA 139 12/14/2017   K 4.8 12/14/2017   K 4.4 12/14/2017   K 4.4 12/14/2017   CL 110 12/14/2017   CL 104 12/14/2017   CL 106 12/14/2017   CO2 21 (L) 12/14/2017   CO2 24 12/14/2017   CO2 26 10/02/2017   GLUCOSE 118 (H) 12/14/2017   GLUCOSE 151 (H) 12/14/2017   GLUCOSE 151 (H) 12/14/2017   BUN 38 (H) 12/14/2017   BUN 41 (H) 12/14/2017   BUN 43 (H) 12/14/2017   CREATININE 0.76 12/14/2017   CREATININE 0.80 12/14/2017   CREATININE 0.91 12/14/2017   CALCIUM 8.0 (L) 12/14/2017   CALCIUM 8.6 (L) 12/14/2017   CALCIUM 9.8 10/02/2017   LFT Recent Labs    12/14/17 0319 12/14/17 0634  PROT 5.5* 4.7*  ALBUMIN 3.6 3.2*  AST 25 28  ALT 16 12  ALKPHOS 62 62  BILITOT 0.6 0.5   PT/INR Lab Results  Component Value Date   INR 1.73 (H) 11/16/2014   INR 1.07 11/14/2014   INR 1.0 10/10/2014   Hepatitis Panel No results for input(s): HEPBSAG, HCVAB, HEPAIGM, HEPBIGM in the last 72 hours. C-Diff No components  found for: CDIFF Lipase  No results found for: LIPASE  Drugs of Abuse  No results found for: LABOPIA, COCAINSCRNUR, LABBENZ, AMPHETMU, THCU, LABBARB   RADIOLOGY STUDIES: Dg Chest 2 View  Result Date: 12/14/2017 CLINICAL DATA:  Initial evaluation for generalized weakness. EXAM: CHEST - 2 VIEW COMPARISON:  Prior radiograph from 12/20/2014. FINDINGS: Median sternotomy wires with underlying surgical clips noted. Prosthetic valve noted. Cardiomegaly unchanged. Tortuosity the intrathoracic aorta noted. Mediastinal silhouette normal. Lungs normally inflated. No focal infiltrates. No pulmonary edema or pleural effusion. No pneumothorax. No acute osseus abnormality. IMPRESSION: 1. No active cardiopulmonary disease. 2. Mild cardiomegaly with aortic valvular prosthesis. Electronically Signed   By: Jeannine Boga M.D.   On: 12/14/2017 02:58     IMPRESSION:   *   GI bleed. Suspect recurrent ulcer disease as she has not been taking any ulcer preventative medications.  *   Blood  loss anemia, symptomatic.  *    Azotemia, suspect this is due to blood in the GI tract.  *   S/P tissue aortic valve replacement.  Not on blood thinners.  Tells me she is not taking baby aspirin.   PLAN:     *   Repeat hemoglobins/hematocrit along with PT/INR later this afternoon.  *   9:15 AM EGD.  Can have clears tonight.  BID PO Protonix ordered and started this AM.    Azucena Freed  12/14/2017, 1:15 PM Phone [phone]

## 2017-12-14 NOTE — ED Triage Notes (Signed)
Per [facility], Pt reports she got up to the bathroom around 12 AM tonight. She felt dizzy, diaphoretic, and near-syncopal. Pt reports she felt better after lying down. Pt reports she had diarrhea that was dark. Pt reports nausea but no vomiting. Pt reports a BP 102/40. Pt reports some abdominal pain

## 2017-12-15 ENCOUNTER — Observation Stay (HOSPITAL_BASED_OUTPATIENT_CLINIC_OR_DEPARTMENT_OTHER): Payer: Medicare Other

## 2017-12-15 ENCOUNTER — Encounter (HOSPITAL_COMMUNITY): Payer: Self-pay

## 2017-12-15 ENCOUNTER — Observation Stay (HOSPITAL_COMMUNITY): Payer: Medicare Other | Admitting: Certified Registered"

## 2017-12-15 ENCOUNTER — Encounter (HOSPITAL_COMMUNITY)
Admission: EM | Disposition: A | Discharge status: Home / Self Care | Payer: Self-pay | Source: Home / Self Care | Attending: Emergency Medicine

## 2017-12-15 ENCOUNTER — Other Ambulatory Visit (HOSPITAL_COMMUNITY): Payer: Medicare Other

## 2017-12-15 DIAGNOSIS — K259 Gastric ulcer, unspecified as acute or chronic, without hemorrhage or perforation: Secondary | ICD-10-CM | POA: Diagnosis not present

## 2017-12-15 DIAGNOSIS — K449 Diaphragmatic hernia without obstruction or gangrene: Secondary | ICD-10-CM | POA: Diagnosis not present

## 2017-12-15 DIAGNOSIS — Z87891 Personal history of nicotine dependence: Secondary | ICD-10-CM | POA: Diagnosis not present

## 2017-12-15 DIAGNOSIS — D62 Acute posthemorrhagic anemia: Secondary | ICD-10-CM | POA: Diagnosis not present

## 2017-12-15 DIAGNOSIS — K922 Gastrointestinal hemorrhage, unspecified: Secondary | ICD-10-CM

## 2017-12-15 DIAGNOSIS — R55 Syncope and collapse: Secondary | ICD-10-CM | POA: Diagnosis not present

## 2017-12-15 DIAGNOSIS — I340 Nonrheumatic mitral (valve) insufficiency: Secondary | ICD-10-CM | POA: Diagnosis not present

## 2017-12-15 DIAGNOSIS — M1990 Unspecified osteoarthritis, unspecified site: Secondary | ICD-10-CM | POA: Diagnosis not present

## 2017-12-15 DIAGNOSIS — I10 Essential (primary) hypertension: Secondary | ICD-10-CM | POA: Diagnosis not present

## 2017-12-15 DIAGNOSIS — K3189 Other diseases of stomach and duodenum: Secondary | ICD-10-CM | POA: Diagnosis not present

## 2017-12-15 DIAGNOSIS — K921 Melena: Secondary | ICD-10-CM | POA: Diagnosis not present

## 2017-12-15 DIAGNOSIS — Z8711 Personal history of peptic ulcer disease: Secondary | ICD-10-CM | POA: Diagnosis not present

## 2017-12-15 DIAGNOSIS — E7800 Pure hypercholesterolemia, unspecified: Secondary | ICD-10-CM | POA: Diagnosis not present

## 2017-12-15 DIAGNOSIS — D649 Anemia, unspecified: Secondary | ICD-10-CM | POA: Diagnosis not present

## 2017-12-15 DIAGNOSIS — Z7982 Long term (current) use of aspirin: Secondary | ICD-10-CM | POA: Diagnosis not present

## 2017-12-15 DIAGNOSIS — Z79899 Other long term (current) drug therapy: Secondary | ICD-10-CM | POA: Diagnosis not present

## 2017-12-15 HISTORY — PX: ESOPHAGOGASTRODUODENOSCOPY (EGD) WITH PROPOFOL: SHX5813

## 2017-12-15 HISTORY — PX: BIOPSY: SHX5522

## 2017-12-15 LAB — CBC
HEMATOCRIT: 27.3 % — AB (ref 36.0–46.0)
HEMOGLOBIN: 8.9 g/dL — AB (ref 12.0–15.0)
MCH: 31.9 pg (ref 26.0–34.0)
MCHC: 32.6 g/dL (ref 30.0–36.0)
MCV: 97.8 fL (ref 78.0–100.0)
Platelets: 137 10*3/uL — ABNORMAL LOW (ref 150–400)
RBC: 2.79 MIL/uL — AB (ref 3.87–5.11)
RDW: 13.2 % (ref 11.5–15.5)
WBC: 7 10*3/uL (ref 4.0–10.5)

## 2017-12-15 LAB — ECHOCARDIOGRAM COMPLETE
HEIGHTINCHES: 65 in
Weight: 2433.6 oz

## 2017-12-15 LAB — GLUCOSE, CAPILLARY: Glucose-Capillary: 109 mg/dL — ABNORMAL HIGH (ref 70–99)

## 2017-12-15 LAB — HEMOGLOBIN AND HEMATOCRIT, BLOOD
HEMATOCRIT: 30.3 % — AB (ref 36.0–46.0)
Hemoglobin: 9.7 g/dL — ABNORMAL LOW (ref 12.0–15.0)

## 2017-12-15 LAB — TROPONIN I: Troponin I: <0.03 ng/mL (ref <0.03)

## 2017-12-15 SURGERY — ESOPHAGOGASTRODUODENOSCOPY (EGD) WITH PROPOFOL
Anesthesia: Monitor Anesthesia Care

## 2017-12-15 MED ORDER — PROPOFOL 500 MG/50ML IV EMUL
INTRAVENOUS | Status: DC | PRN
  Administered 2017-12-15: 75 ug/kg/min via INTRAVENOUS

## 2017-12-15 MED ORDER — PROPOFOL 10 MG/ML IV BOLUS
INTRAVENOUS | Status: DC | PRN
  Administered 2017-12-15 (×3): 20 mg via INTRAVENOUS

## 2017-12-15 MED ORDER — LACTATED RINGERS IV SOLN
INTRAVENOUS | Status: DC | PRN
  Administered 2017-12-15: 11:00:00 via INTRAVENOUS

## 2017-12-15 MED ORDER — PANTOPRAZOLE SODIUM 40 MG PO TBEC: 40.0000 mg | DELAYED_RELEASE_TABLET | Freq: Two times a day (BID) | ORAL | 3 refills | Status: DC

## 2017-12-15 MED ORDER — ONDANSETRON HCL 4 MG/2ML IJ SOLN
INTRAMUSCULAR | Status: DC | PRN
  Administered 2017-12-15: 4 mg via INTRAVENOUS

## 2017-12-15 MED ORDER — DEXAMETHASONE SODIUM PHOSPHATE 10 MG/ML IJ SOLN
INTRAMUSCULAR | Status: DC | PRN
  Administered 2017-12-15: 5 mg via INTRAVENOUS

## 2017-12-15 SURGICAL SUPPLY — 15 items

## 2017-12-15 NOTE — Plan of Care (Signed)
  Problem: Clinical Measurements: Goal: Will remain free from infection Outcome: Progressing Note:  No s/s of infection noted. Goal: Respiratory complications will improve Outcome: Progressing Note:  No s/s of respiratory complications noted.

## 2017-12-15 NOTE — Progress Notes (Signed)
Progress Note  Patient Name: [name] Date of Encounter: 12/15/2017  Primary Cardiologist: [name], MD   Subjective   84 year old female with a history of aortic valve replacement.  We are called yesterday after she had an episode of near syncope.  She was found to have an acute GI bleed.  Endoscopy today revealed for ulcers that were not actively bleeding.  Biopsies were taken. She is feeling quite well today.  She has not had any further episodes of dizziness or presyncope.  Inpatient Medications    Scheduled Meds: . bisacodyl  10 mg Rectal Once  . magnesium oxide  400 mg Oral Daily  . pantoprazole  40 mg Oral BID  . rosuvastatin  10 mg Oral Daily   Continuous Infusions:  PRN Meds: acetaminophen **OR** acetaminophen, ALPRAZolam, ondansetron **OR** ondansetron (ZOFRAN) IV   Vital Signs    Vitals:   12/15/17 1110 12/15/17 1120 12/15/17 1130 12/15/17 1158  BP: (!) 108/91 (!) 110/59 (!) 107/51 (!) 115/43  Pulse:    (!) 57  Resp:      Temp:      TempSrc:      SpO2:    98%  Weight:      Height:        Intake/Output Summary (Last 24 hours) at 12/15/2017 1327 Last data filed at 12/15/2017 1100 Gross per 24 hour  Intake 1655.36 ml  Output 1400 ml  Net 255.36 ml   Filed Weights   12/14/17 1640 12/15/17 0236  Weight: 69.4 kg 69 kg    Telemetry    NSR  - Personally Reviewed  ECG    NSR  - Personally Reviewed  Physical Exam   GEN: No acute distress.   Neck: No JVD Cardiac: RRR, no murmurs, rubs, or gallops.  Respiratory: Clear to auscultation bilaterally. GI: Soft, nontender, non-distended  MS: No edema; No deformity. Neuro:  Nonfocal  Psych: Normal affect   Labs    Chemistry Recent Labs  Lab 12/14/17 0319 12/14/17 0354 12/14/17 0634  NA 139 137 137  K 4.4 4.4 4.8  CL 106 104 110  CO2 24  --  21*  GLUCOSE 151* 151* 118*  BUN 43* 41* 38*  CREATININE 0.91 0.80 0.76  CALCIUM 8.6*  --  8.0*  PROT 5.5*  --  4.7*  ALBUMIN 3.6   --  3.2*  AST 25  --  28  ALT 16  --  12  ALKPHOS 62  --  62  BILITOT 0.6  --  0.5  GFRNONAA 57*  --  >60  GFRAA >60  --  >60  ANIONGAP 9  --  6     Hematology Recent Labs  Lab 12/14/17 0319  12/14/17 0634 12/14/17 1506 12/15/17 0153  WBC 12.2*  --  7.6  --  7.0  RBC 3.43*  --  2.96*  2.92*  --  2.79*  HGB 11.0*   < > 9.4* 9.2* 8.9*  HCT 34.5*   < > 29.4* 28.6* 27.3*  MCV 100.6*  --  99.3  --  97.8  MCH 32.1  --  31.8  --  31.9  MCHC 31.9  --  32.0  --  32.6  RDW 13.2  --  13.1  --  13.2  PLT 158  --  161  --  137*   < > = values in this interval not displayed.    Cardiac Enzymes Recent Labs  Lab 12/14/17 0319 12/14/17 1194 12/14/17 1106 12/15/17  0153  TROPONINI <0.03 <0.03 0.11* <0.03    Recent Labs  Lab 12/14/17 0352  TROPIPOC 0.00     BNPNo results for input(s): BNP, PROBNP in the last 168 hours.   DDimer No results for input(s): DDIMER in the last 168 hours.   Radiology    Dg Chest 2 View  Result Date: 12/14/2017 CLINICAL DATA:  Initial evaluation for generalized weakness. EXAM: CHEST - 2 VIEW COMPARISON:  Prior radiograph from 12/20/2014. FINDINGS: Median sternotomy wires with underlying surgical clips noted. Prosthetic valve noted. Cardiomegaly unchanged. Tortuosity the intrathoracic aorta noted. Mediastinal silhouette normal. Lungs normally inflated. No focal infiltrates. No pulmonary edema or pleural effusion. No pneumothorax. No acute osseus abnormality. IMPRESSION: 1. No active cardiopulmonary disease. 2. Mild cardiomegaly with aortic valvular prosthesis. Electronically Signed   By: Jeannine Boga M.D.   On: 12/14/2017 02:58    Cardiac Studies     Patient Profile     84 y.o. female with aortic valve replacement who was admitted after an episode of near syncope.  Assessment & Plan    1.  Near-syncope: I suspect that etiology of her near syncope was the acute blood loss.  She has not had any valvular problems.  Echocardiogram in May  showed that the valve was functioning normally. Troponin levels are basically negative.  Her first troponin was normal, the second troponin was mildly elevated at 0.11, and the third troponin was less than 0.03. Possible that the second, mildly elevated troponin level is a lab error.  It certainly is not consistent with an acute coronary syndrome.  She denies any episodes of chest discomfort.  Her hemoglobin has fallen slightly today.  Her primary medical doctor wants to keep her overnight.  From my standpoint she is cleared to go home.  We have ordered an echocardiogram and will be fine to get the echo if they are able to do it before she leaves.  If the medical team decides that she is able to be discharged and we can get the echocardiogram as an outpatient.  [facility] will sign off.   Medication Recommendations:  Continue same cardiac meds  Other recommendations (labs, testing, etc):   Follow up as an outpatient:  With Dr. Burt Knack or APP as scheduled.   For questions or updates, please contact [place] Please consult [email or web address] for contact info under Cardiology/STEMI.      Signed, Mertie Moores, MD  12/15/2017, 1:27 PM

## 2017-12-15 NOTE — Discharge Summary (Addendum)
[name], is a 84 y.o. female  DOB 1932/12/17  MRN [id number].  Admission date:  12/14/2017  Admitting Physician  Rise Patience, MD  Discharge Date:  12/15/2017   Primary MD  [name], MD  Recommendations for primary care physician for things to follow:  -6 CBC in 1 week, if stable can resume aspirin, should be on hold until bed. -Continue with Protonix 40 mg twice daily for 64-month 40 mg oral daily indefinitely -Need to follow with GI about H. pylori biopsy results   Admission Diagnosis  Near syncope [R55]   Discharge Diagnosis  Near syncope [R55]   Active Problems:   History of right breast cancer   S/P AVR   Syncope   Macrocytic anemia   Near syncope      Past Medical History:  Diagnosis Date  . Aortic valve stenosis, severe   . [place]    [place]  . Hiatal hernia   . Hypercholesteremia   . Hypoglycemia   . Osteopenia   . Tingling   . Ulcer 2012   bleeding gastric Ulcer    Past Surgical History:  Procedure Laterality Date  . AORTIC VALVE REPLACEMENT N/A 11/16/2014   Procedure: AORTIC VALVE REPLACEMENT (AVR);  Surgeon: BGaye Pollack MD;  [facility]  Service: Open Heart Surgery;  Laterality: N/A;  . BREAST SURGERY    . CARDIAC CATHETERIZATION N/A 10/18/2014   Procedure: Right/Left Heart Cath and Coronary Angiography;  Surgeon: MSherren Mocha MD;  [facility];  Service: Cardiovascular;  Laterality: N/A;  . COSMETIC SURGERY     face  . PARATHYROIDECTOMY    . TEE WITHOUT CARDIOVERSION N/A 11/16/2014   Procedure: TRANSESOPHAGEAL ECHOCARDIOGRAM (TEE);  Surgeon: BGaye Pollack MD;  [place] [facility]  Service: Open Heart Surgery;  Laterality: N/A;  . TONSILLECTOMY         History of present illness and  Hospital Course:     Kindly see H&P for history of present illness and admission details, please review complete Labs,  Consult reports and Test reports for all details in brief   HPI  from the history and physical done on the day of admission 12/14/2017 HPI: Pamela VESSEYis a 84y.o. female with history of bioprosthetic aortic valve replacement 3 years ago presents to the ER after patient had a spell of dizziness with weakness and diaphoresis.  Patient states she was doing well yesterday and had walked around with no difficulty.  At around bedtime patient started feeling uneasy and weak.  Patient got her blood pressure checked by the nurse.  It was low normal as per the patient.  Did not have any chest pain.  Later when she went to the bathroom to get her teeth brushed she felt the same symptoms again became more diaphoretic and was brought to the ER.  Patient states [place] had recently [place] twice.  [place] independent living facility.  A month ago she was found to be having high double blood pressure and was placed on low-dose amlodipine.  Also was started on statins.  ED Course: In the ER patient was found to be low normal blood pressure and was given fluid bolus.  EKG was showing diffuse T wave changes and cardiology was notified.  Chest x-ray unremarkable.  Patient admitted for further management of near syncope with diaphoresis.  Patient says he had a similar diaphoretic episode 3 months ago in [place].  At that time patient was found to be bradycardic.    Hospital Course   Near-syncope -Etiology most likely related to acute blood loss anemia, denies any chest pain, or shortness of breath, 2D echo was obtained with no acute findings, no change in her mitral valve from most recent echo last year, seen by cardiology, her near-syncope felt to be secondary to GI bleed, and noncardiac.  Acute blood loss anemia -Likely in the setting of upper GI bleed, she went for endoscopy today by GI, was significant for 4  gastric ulcer with clean base, none actively bleeding, with some gastritis, recommendation has been for Protonix 40 mg twice daily, she was instructed to hold aspirin until she is seen by PCP, and repeat blood work then, it can be resumed if hemoglobin is stable, to follow with GI as an outpatient about H. pylori results.  Hypertension -Pressure soft, I have stopped amlodipine  Upper GI bleed -Please see above discussion,  Hyperlipidemia -Continue with statin  Discharge Condition:  stable   Follow UP  Follow-up Information    [name], MD Follow up in 1 week(s).   Specialty:  Internal Medicine Contact information: [place] [address] [phone]        Jerene Bears, MD Follow up in 2 week(s).   Specialty:  Gastroenterology Contact information: [address] [address] [phone]             Discharge Instructions  and  Discharge Medications    Discharge Instructions    Diet - low sodium heart healthy   Complete by:  As directed    Discharge instructions   Complete by:  As directed    Follow with Primary MD [name], MD in 7 days   Get CBC, CMP,y checked  by Primary MD next visit.    Activity: As tolerated with Full fall precautions use walker/cane & assistance as needed   Disposition Home   Diet: Heart Healthy  , with feeding assistance and aspiration precautions.  For Heart failure patients - Check your Weight same time everyday, if you gain over 2 pounds, or you develop in leg swelling, experience more shortness of breath or chest pain, call your Primary MD immediately. Follow Cardiac Low Salt Diet and 1.5 lit/day fluid restriction.   On your next visit with your primary care physician please Get Medicines reviewed and adjusted.   Please request your Prim.MD to go over all Hospital Tests and Procedure/Radiological results at the follow up, please get all Hospital records sent to your Prim MD by signing  hospital release before you go home.   If you experience worsening of your admission symptoms, develop shortness of breath, life threatening emergency, suicidal or homicidal thoughts you must seek medical attention immediately by calling 911 or calling your MD immediately  if symptoms less severe.  You Must read complete instructions/literature along with all  the possible adverse reactions/side effects for all the Medicines you take and that have been prescribed to you. Take any new Medicines after you have completely understood and accpet all the possible adverse reactions/side effects.   Do not drive, operating heavy machinery, perform activities at heights, swimming or participation in water activities or provide baby sitting services if your were admitted for syncope or siezures until you have seen by Primary MD or a Neurologist and advised to do so again.  Do not drive when taking Pain medications.    Do not take more than prescribed Pain, Sleep and Anxiety Medications  Special Instructions: If you have smoked or chewed Tobacco  in the last 2 yrs please stop smoking, stop any regular Alcohol  and or any Recreational drug use.  Wear Seat belts while driving.   Please note  You were cared for by a hospitalist during your hospital stay. If you have any questions about your discharge medications or the care you received while you were in the hospital after you are discharged, you can call the unit and asked to speak with the hospitalist on call if the hospitalist that took care of you is not available. Once you are discharged, your primary care physician will handle any further medical issues. Please note that NO REFILLS for any discharge medications will be authorized once you are discharged, as it is imperative that you return to your primary care physician (or establish a relationship with a primary care physician if you do not have one) for your aftercare needs so that they can reassess  your need for medications and monitor your lab values.   Increase activity slowly   Complete by:  As directed      Allergies as of 12/15/2017      Reactions   Amoxicillin-pot Clavulanate Hives, Rash   Penicillin G Rash   Penicillins Hives, Rash   Sulfa Antibiotics Rash   Sulfamethoxazole Rash   Sulfonamide Derivatives Hives      Medication List    STOP taking these medications   amLODipine 2.5 MG tablet Commonly known as:  NORVASC   aspirin EC 81 MG tablet     TAKE these medications   acetaminophen 500 MG tablet Commonly known as:  TYLENOL Take 500-1,000 mg by mouth at bedtime as needed for mild pain or moderate pain.   ALPRAZolam 0.5 MG tablet Commonly known as:  XANAX Take 1 tablet (0.5 mg total) by mouth daily as needed for anxiety. Do not take at night   BENEFIBER DRINK MIX PO Take 5 mLs by mouth every morning. Mix with 1/2 a glass water, and takes with vitamins   CALCIUM 600+D3 600-400 MG-UNIT Tabs Generic drug:  Calcium Carb-Cholecalciferol Take 1 tablet by mouth daily.   Chloral Hydrate Crys Take 10 mLs by mouth at bedtime as needed (Call  liquid into [facility]). What changed:    reasons to take this  additional instructions   clindamycin 300 MG capsule Commonly known as:  CLEOCIN Take 600 mg by mouth as directed. Take 600 mg (2 capsules) by mouth one hour prior to dental procedures   CO Q 10 PO Take 1 capsule by mouth daily.   Magnesium 400 MG Caps Take 400 mg by mouth every morning.   OCUSOFT EYE [place] OP Apply 1 application to eye 2 (two) times daily as needed (dry eyes). Morning and before bed   pantoprazole 40 MG tablet Commonly known as:  PROTONIX Take 1 tablet (  40 mg total) by mouth 2 (two) times daily.   rosuvastatin 10 MG tablet Commonly known as:  CRESTOR Take 1 tablet (10 mg total) by mouth daily.   SYSTANE ULTRA PF 0.4-0.3 % Soln Generic drug:  Polyethyl Glycol-Propyl Glycol Apply 1 drop to eye 2 (two) times  daily.   Vitamin D-3 1000 units Caps Take 1 capsule by mouth daily.         Diet and Activity recommendation: See Discharge Instructions above   Consults obtained -  GI Cardiology   Major procedures and Radiology Reports - PLEASE review detailed and final reports for all details, in brief -   EGD  Findings:      No gross lesions were noted in the entire esophagus.      The Z-line was regular and was found 35 cm from the incisors.      A small hiatal hernia was present.      Four non-bleeding cratered gastric ulcers with a clean ulcer base       (Forrest Class III) were found in the gastric antrum. The largest lesion       was 10 mm in largest dimension.      Localized moderately congested and erythematous mucosa without bleeding       was found in the gastric antrum surrounding the gastric ulcers.      No gross lesions were noted in the entire examined stomach otherwise.       Biopsies were taken with a cold forceps for histology and Helicobacter       pylori testing from the antrum/incisura/greater curve/lesser curve.      No gross lesions were noted in the duodenal bulb, in the first portion       of the duodenum and in the second portion of the duodenum. Impression:               - No gross lesions in esophagus.                           - Z-line regular, 35 cm from the incisors.                           - Small hiatal hernia.                           - Non-bleeding gastric ulcers with a clean ulcer                            base (Forrest Class III). Associated erythematous                            and congested mucosa in the antrum surrounding the                            ulcers.                           - Biopsied to rule out HP.                           - No gross lesions in the duodenal bulb, in the  first portion of the duodenum and in the second                            portion of the duodenum. Recommendation:           -  The patient will be observed post-procedure,                            until all discharge criteria are met.                           - Return patient to hospital ward for ongoing care.                           - Observe patient's clinical course.                           - Await pathology results.                           - Patient can be transitioned to 40 mg twice daily                            PPI which should be taken for at least the next                            97-month. Patient will need to be on life-long PPI                            therapy as a result of having 2 separate GI bleeds                            associated with gastric ulcer formation over the                            last few years.                           - If stable hemogram and no further bleeding then                            reasonable to consider discharge late today vs                            tomorrow.                           - Patient needs to have a repeat EGD in 2-3 months                            for follow up of gastric ulcers.                           - The findings and recommendations were discussed  with the patient.                           - The findings and recommendations were discussed                            with the patient's family.                           - The findings and recommendations were discussed                            with the referring physician. Procedure Code(s):        --- Professional ---                           [phone], Esophagogastroduodenoscopy, flexible,                            transoral; with biopsy, single or multiple  Dg Chest 2 View  Result Date: 12/14/2017 CLINICAL DATA:  Initial evaluation for generalized weakness. EXAM: CHEST - 2 VIEW COMPARISON:  Prior radiograph from 12/20/2014. FINDINGS: Median sternotomy wires with underlying surgical clips noted. Prosthetic valve noted. Cardiomegaly unchanged.  Tortuosity the intrathoracic aorta noted. Mediastinal silhouette normal. Lungs normally inflated. No focal infiltrates. No pulmonary edema or pleural effusion. No pneumothorax. No acute osseus abnormality. IMPRESSION: 1. No active cardiopulmonary disease. 2. Mild cardiomegaly with aortic valvular prosthesis. Electronically Signed   By: Jeannine Boga M.D.   On: 12/14/2017 02:58    Micro Results     No results found for this or any previous visit (from the past 240 hour(s)).     Today   Subjective:   [name] today has no headache,no chest or abdominal pain,no new weakness tingling or numbness, feels much better wants to go home today.   Objective:   Blood pressure (!) 115/43, pulse (!) 57, temperature 98 F (36.7 C), temperature source Oral, resp. rate (!) 21, height _0  (1.651 m), weight 69 kg, SpO2 98 %.   Intake/Output Summary (Last 24 hours) at 12/15/2017 1741 Last data filed at 12/15/2017 1400 Gross per 24 hour  Intake 1895.36 ml  Output 2000 ml  Net -104.64 ml    Exam Awake Alert, Oriented x 3, No new F.N deficits, Normal affect.  Symmetrical Chest wall movement, Good air movement bilaterally, CTAB RRR,No Gallops, +ve B.Sounds, Abd Soft, Non tender,  No Cyanosis, Clubbing or edema, No new Rash or bruise  Data Review   CBC w Diff:  Lab Results  Component Value Date   WBC 7.0 12/15/2017   HGB 9.7 (L) 12/15/2017   HGB 13.5 10/31/2014   HCT 30.3 (L) 12/15/2017   HCT 40.7 10/31/2014   PLT 137 (L) 12/15/2017   PLT 190 10/31/2014   LYMPHOPCT 26 12/14/2017   LYMPHOPCT 27.1 10/31/2014   MONOPCT 5 12/14/2017   MONOPCT 7.0 10/31/2014   EOSPCT 0 12/14/2017   EOSPCT 1.3 10/31/2014   BASOPCT 0 12/14/2017   BASOPCT 0.6 10/31/2014    CMP:  Lab Results  Component Value Date   NA 137 12/14/2017   NA 135 (L) 10/31/2014   K 4.8 12/14/2017   K 4.3 10/31/2014   CL 110 12/14/2017  CO2 21 (L) 12/14/2017   CO2 24 10/31/2014   BUN 38 (H) 12/14/2017    BUN 15.0 10/31/2014   CREATININE 0.76 12/14/2017   CREATININE 0.77 12/15/2014   CREATININE 0.8 10/31/2014   PROT 4.7 (L) 12/14/2017   PROT 6.3 (L) 10/31/2014   ALBUMIN 3.2 (L) 12/14/2017   ALBUMIN 3.8 10/31/2014   BILITOT 0.5 12/14/2017   BILITOT 0.66 10/31/2014   ALKPHOS 62 12/14/2017   ALKPHOS 63 10/31/2014   AST 28 12/14/2017   AST 25 10/31/2014   ALT 12 12/14/2017   ALT 20 10/31/2014  .   Total Time in preparing paper work, data evaluation and todays exam - 4 minutes  Phillips Climes M.D on 12/15/2017 at 5:41 PM  [facility]   Office  [phone]

## 2017-12-15 NOTE — Discharge Instructions (Signed)
Follow with Primary MD [name], MD in 7 days   Get CBC, CMP,y checked  by Primary MD next visit.    Activity: As tolerated with Full fall precautions use walker/cane & assistance as needed   Disposition Home   Diet: Heart Healthy  , with feeding assistance and aspiration precautions.  For Heart failure patients - Check your Weight same time everyday, if you gain over 2 pounds, or you develop in leg swelling, experience more shortness of breath or chest pain, call your Primary MD immediately. Follow Cardiac Low Salt Diet and 1.5 lit/day fluid restriction.   On your next visit with your primary care physician please Get Medicines reviewed and adjusted.   Please request your Prim.MD to go over all Hospital Tests and Procedure/Radiological results at the follow up, please get all Hospital records sent to your Prim MD by signing hospital release before you go home.   If you experience worsening of your admission symptoms, develop shortness of breath, life threatening emergency, suicidal or homicidal thoughts you must seek medical attention immediately by calling 911 or calling your MD immediately  if symptoms less severe.  You Must read complete instructions/literature along with all the possible adverse reactions/side effects for all the Medicines you take and that have been prescribed to you. Take any new Medicines after you have completely understood and accpet all the possible adverse reactions/side effects.   Do not drive, operating heavy machinery, perform activities at heights, swimming or participation in water activities or provide baby sitting services if your were admitted for syncope or siezures until you have seen by Primary MD or a Neurologist and advised to do so again.  Do not drive when taking Pain medications.    Do not take more than prescribed Pain, Sleep and Anxiety Medications  Special Instructions: If you have smoked or chewed Tobacco  in the last 2 yrs please  stop smoking, stop any regular Alcohol  and or any Recreational drug use.  Wear Seat belts while driving.   Please note  You were cared for by a hospitalist during your hospital stay. If you have any questions about your discharge medications or the care you received while you were in the hospital after you are discharged, you can call the unit and asked to speak with the hospitalist on call if the hospitalist that took care of you is not available. Once you are discharged, your primary care physician will handle any further medical issues. Please note that NO REFILLS for any discharge medications will be authorized once you are discharged, as it is imperative that you return to your primary care physician (or establish a relationship with a primary care physician if you do not have one) for your aftercare needs so that they can reassess your need for medications and monitor your lab values.

## 2017-12-15 NOTE — Interval H&P Note (Signed)
History and Physical Interval Note:  12/15/2017 10:11 AM  [name]  has presented today for surgery, with the diagnosis of Black, watery, FOBT positive stool.  Acute anemia.  History gastric ulcer in 2012, not on PPI or H2 blocker  The various methods of treatment have been discussed with the patient and family. After consideration of risks, benefits and other options for treatment, the patient has consented to  Procedure(s): ESOPHAGOGASTRODUODENOSCOPY (EGD) WITH PROPOFOL (N/A) as a surgical intervention .  The patient's history has been reviewed, patient examined, no change in status, stable for surgery.  I have reviewed the patient's chart and labs.  Questions were answered to the patient's satisfaction.     [employer]

## 2017-12-15 NOTE — Anesthesia Postprocedure Evaluation (Signed)
Anesthesia Post Note  Patient: [name]  Procedure(s) Performed: ESOPHAGOGASTRODUODENOSCOPY (EGD) WITH PROPOFOL (N/A ) BIOPSY     Patient location during evaluation: PACU Anesthesia Type: MAC Level of consciousness: awake and alert Pain management: pain level controlled Vital Signs Assessment: post-procedure vital signs reviewed and stable Respiratory status: spontaneous breathing, nonlabored ventilation, respiratory function stable and patient connected to nasal cannula oxygen Cardiovascular status: stable and blood pressure returned to baseline Postop Assessment: no apparent nausea or vomiting Anesthetic complications: no    Last Vitals:  Vitals:   12/15/17 1130 12/15/17 1158  BP: (!) 107/51 (!) 115/43  Pulse:  (!) 57  Resp:    Temp:    SpO2:  98%    Last Pain:  Vitals:   12/15/17 1105  TempSrc: Oral  PainSc: 0-No pain                 [name] P Ciani Rutten

## 2017-12-15 NOTE — Transfer of Care (Signed)
Immediate Anesthesia Transfer of Care Note  Patient: [name]  Procedure(s) Performed: ESOPHAGOGASTRODUODENOSCOPY (EGD) WITH PROPOFOL (N/A ) BIOPSY  Patient Location: Endoscopy Unit  Anesthesia Type:MAC  Level of Consciousness: drowsy and patient cooperative  Airway & Oxygen Therapy: Patient Spontanous Breathing and Patient connected to nasal cannula oxygen  Post-op Assessment: Report given to RN  Post vital signs: Reviewed and stable  Last Vitals:  Vitals Value Taken Time  BP    Temp    Pulse    Resp    SpO2      Last Pain:  Vitals:   12/15/17 0824  TempSrc: Oral  PainSc: 3       Patients Stated Pain Goal: 0 (07/62/26 3335)  Complications: No apparent anesthesia complications

## 2017-12-15 NOTE — Anesthesia Preprocedure Evaluation (Addendum)
Anesthesia Evaluation  Patient identified by MRN, date of birth, ID band Patient awake    Reviewed: Allergy & Precautions, NPO status , Patient's Chart, lab work & pertinent test results  Airway Mallampati: I  TM Distance: >3 FB Neck ROM: Full    Dental no notable dental hx. (+) Teeth Intact, Dental Advisory Given   Pulmonary neg pulmonary ROS, former smoker,    Pulmonary exam normal breath sounds clear to auscultation       Cardiovascular hypertension, Pt. on medications Normal cardiovascular exam+ Valvular Problems/Murmurs (s/p AVR) AS  Rhythm:Regular Rate:Normal  TTE 07/2016 EF 65% to 70%, bioprosthetic aortic valve, mild AI, mod TR, left atrium severely dilated   Neuro/Psych Anxiety negative neurological ROS     GI/Hepatic negative GI ROS, Neg liver ROS, hiatal hernia, FOBT positive stool with acute anemia. H/o gastric ulcer 2012, not in PPI or H2 blocker   Endo/Other  negative endocrine ROS  Renal/GU negative Renal ROS  negative genitourinary   Musculoskeletal negative musculoskeletal ROS (+)   Abdominal   Peds negative pediatric ROS (+)  Hematology  (+) anemia ,   Anesthesia Other Findings   Reproductive/Obstetrics negative OB ROS                            Anesthesia Physical Anesthesia Plan  ASA: III  Anesthesia Plan: MAC   Post-op Pain Management:    Induction: Intravenous  PONV Risk Score and Plan: 2 and Dexamethasone and Ondansetron  Airway Management Planned: Natural Airway and Simple Face Mask  Additional Equipment:   Intra-op Plan:   Post-operative Plan:   Informed Consent: I have reviewed the patients History and Physical, chart, labs and discussed the procedure including the risks, benefits and alternatives for the proposed anesthesia with the patient or authorized representative who has indicated his/her understanding and acceptance.   Dental advisory  given  Plan Discussed with: CRNA  Anesthesia Plan Comments:         Anesthesia Quick Evaluation

## 2017-12-15 NOTE — Op Note (Signed)
[facility] Patient Name: [name] Procedure Date : 12/15/2017 MRN: [id number] Attending MD: Justice Britain , MD Date of Birth: 04-23-1933 CSN: [id number] Age: 84 Admit Type: Inpatient Procedure:                Upper GI endoscopy Indications:              Acute post hemorrhagic anemia, Melena, Personal                            history of peptic ulcer disease, Personal history                            of upper GI endoscopy Providers:                Justice Britain, MD, Kingsley Plan, RN,                            William Dalton, Technician Referring MD:             Lajuan Lines. Hilarie Fredrickson, MD, Albertine Patricia, Azucena Freed PA, PA, [name], MD Medicines:                Monitored Anesthesia Care Complications:            No immediate complications. Estimated Blood Loss:     Estimated blood loss was minimal. Procedure:                Pre-Anesthesia Assessment:                           - Prior to the procedure, a History and Physical                            was performed, and patient medications and                            allergies were reviewed. The patient's tolerance of                            previous anesthesia was also reviewed. The risks                            and benefits of the procedure and the sedation                            options and risks were discussed with the patient.                            All questions were answered, and informed consent                            was obtained. Prior Anticoagulants: The patient has  taken no previous anticoagulant or antiplatelet                            agents. ASA Grade Assessment: II - A patient with                            mild systemic disease. After reviewing the risks                            and benefits, the patient was deemed in                            satisfactory condition to undergo the procedure.                         After obtaining informed consent, the endoscope was                            passed under direct vision. Throughout the                            procedure, the patient's blood pressure, pulse, and                            oxygen saturations were monitored continuously. The                            GIF-H190 ([id number]) Olympus Adult EGD was introduced                            through the mouth, and advanced to the second part                            of duodenum. The upper GI endoscopy was                            accomplished without difficulty. The patient                            tolerated the procedure well. Scope In: Scope Out: Findings:      No gross lesions were noted in the entire esophagus.      The Z-line was regular and was found 35 cm from the incisors.      A small hiatal hernia was present.      Four non-bleeding cratered gastric ulcers with a clean ulcer base       (Forrest Class III) were found in the gastric antrum. The largest lesion       was 10 mm in largest dimension.      Localized moderately congested and erythematous mucosa without bleeding       was found in the gastric antrum surrounding the gastric ulcers.      No gross lesions were noted in the entire examined stomach otherwise.       Biopsies were taken with a cold forceps for histology and Helicobacter  pylori testing from the antrum/incisura/greater curve/lesser curve.      No gross lesions were noted in the duodenal bulb, in the first portion       of the duodenum and in the second portion of the duodenum. Impression:               - No gross lesions in esophagus.                           - Z-line regular, 35 cm from the incisors.                           - Small hiatal hernia.                           - Non-bleeding gastric ulcers with a clean ulcer                            base (Forrest Class III). Associated erythematous                            and  congested mucosa in the antrum surrounding the                            ulcers.                           - Biopsied to rule out HP.                           - No gross lesions in the duodenal bulb, in the                            first portion of the duodenum and in the second                            portion of the duodenum. Recommendation:           - The patient will be observed post-procedure,                            until all discharge criteria are met.                           - Return patient to hospital ward for ongoing care.                           - Observe patient's clinical course.                           - Await pathology results.                           - Patient can be transitioned to 40 mg twice daily  PPI which should be taken for at least the next                            69-month. Patient will need to be on life-long PPI                            therapy as a result of having 2 separate GI bleeds                            associated with gastric ulcer formation over the                            last few years.                           - If stable hemogram and no further bleeding then                            reasonable to consider discharge late today vs                            tomorrow.                           - Patient needs to have a repeat EGD in 2-3 months                            for follow up of gastric ulcers.                           - The findings and recommendations were discussed                            with the patient.                           - The findings and recommendations were discussed                            with the patient's family.                           - The findings and recommendations were discussed                            with the referring physician. Procedure Code(s):        --- Professional ---                           [id number] Esophagogastroduodenoscopy, flexible,                             transoral; with biopsy, single or multiple Diagnosis Code(s):        --- Professional ---  K44.9, Diaphragmatic hernia without obstruction or                            gangrene                           K25.9, Gastric ulcer, unspecified as acute or                            chronic, without hemorrhage or perforation                           K31.89, Other diseases of stomach and duodenum                           D62, Acute posthemorrhagic anemia                           K92.1, Melena (includes Hematochezia)                           Z87.11, Personal history of peptic ulcer disease                           Z98.890, Other specified postprocedural states CPT copyright 2017 [employer]. All rights reserved. The codes documented in this report are preliminary and upon coder review may  be revised to meet current compliance requirements. Justice Britain, MD 12/15/2017 12:57:49 PM Number of Addenda: 0

## 2017-12-15 NOTE — Care Management Obs Status (Signed)
[place] NOTIFICATION   Patient Details  Name: [name] MRN: [id number] Date of Birth: 02-04-1933   Medicare Observation Status Notification Given:  Yes    Bethena Roys, RN 12/15/2017, 4:39 PM

## 2017-12-15 NOTE — Anesthesia Procedure Notes (Signed)
Procedure Name: MAC Date/Time: 12/15/2017 10:41 AM Performed by: Barrington Ellison, CRNA Pre-anesthesia Checklist: Patient identified, Emergency Drugs available, Suction available, Patient being monitored and Timeout performed Oxygen Delivery Method: Nasal cannula

## 2017-12-16 ENCOUNTER — Telehealth: Payer: Self-pay | Admitting: *Deleted

## 2017-12-16 ENCOUNTER — Encounter: Payer: Self-pay | Admitting: Internal Medicine

## 2017-12-16 DIAGNOSIS — R55 Syncope and collapse: Secondary | ICD-10-CM

## 2017-12-16 DIAGNOSIS — D539 Nutritional anemia, unspecified: Secondary | ICD-10-CM

## 2017-12-16 NOTE — Telephone Encounter (Signed)
Notified pt MD ok order has been entered in system.Marland KitchenJohny Rosales

## 2017-12-16 NOTE — Telephone Encounter (Signed)
Made pt a TCM hosp f/u appt for 12/21/17,but pt is requesting to have her hemoglobin check on this Friday. Is this ok to order...lmb

## 2017-12-16 NOTE — Telephone Encounter (Signed)
Transition Care Management Follow-up Telephone Call   Date discharged? 12/15/17   How have you been since you were released from the hospital? Pt states she is feeling ok as of right now   Do you understand why you were in the hospital? YES   Do you understand the discharge instructions? YES   Where were you discharged to? Home   Items Reviewed:  Medications reviewed: YES  Allergies reviewed: YES  Dietary changes reviewed: YES, heart healthy  Referrals reviewed: YES, waiting on GI appt   Functional Questionnaire:   Activities of Daily Living (ADLs):   She states she are independent in the following: continence, grooming and toileting States they require assistance with the following: ambulation, bathing and hygiene and feeding   Any transportation issues/concerns?: NO   Any patient concerns? YES, pt states she is needing to have her hemoglobin check on this Friday. She want to make sure that her levels are still up.she rather have test done first and then see Dr. Quay Burow. Inform pt will send MD msg to get order approved, and give her a call back w/status   Confirmed importance and date/time of follow-up visits scheduled YES, appt 12/21/17  Provider Appointment booked with Dr. Quay Burow  Confirmed with patient if condition begins to worsen call PCP or go to the ER.  Patient was given the office number and encouraged to call back with question or concerns.  : YES

## 2017-12-16 NOTE — Telephone Encounter (Signed)
Yes ok to order  

## 2017-12-16 NOTE — Telephone Encounter (Signed)
Pt was on the TCM report caled pt to set-up hosp f/u appt there was no answer. LMOM TRC to schedule hosp f/u appt.Sent [facility] CRM w/FYI...[name]

## 2017-12-17 ENCOUNTER — Telehealth: Payer: Self-pay

## 2017-12-17 NOTE — Telephone Encounter (Signed)
Recall in epic. 

## 2017-12-17 NOTE — Telephone Encounter (Signed)
-----   Message from Jerene Bears, MD sent at 12/17/2017 12:10 PM EDT ----- Regarding: FW: [employer] patient Pt will need egd in 2-3 months Thanks JMP  ----- Message ----- From: Irving Copas., MD Sent: 12/16/2017   9:58 PM EDT To: Jerene Bears, MD Subject: RE: [facility] patient                             Ulcers. Patient will need EGD f/u in 2-2.5 months. All the best. Chester Holstein ----- Message ----- From: Jerene Bears, MD Sent: 12/15/2017   9:20 AM EDT To: Irving Copas., MD Subject: RE: [facility] patient                             Thanks Gabe ----- Message ----- From: Irving Copas., MD Sent: 12/14/2017  10:51 PM EDT To: Vena Rua, PA-C, Jerene Bears, MD Subject: [facility] patient                                 Dear Ulice Dash, I hope you are well. A very nice lady. She is admitted with likely a recurrent UGI bleed. Planning EGD tomorrow and will keep you UTD. Chester Holstein

## 2017-12-18 ENCOUNTER — Other Ambulatory Visit (INDEPENDENT_AMBULATORY_CARE_PROVIDER_SITE_OTHER): Payer: Medicare Other

## 2017-12-18 ENCOUNTER — Other Ambulatory Visit: Payer: Self-pay

## 2017-12-18 ENCOUNTER — Encounter: Payer: Self-pay | Admitting: Internal Medicine

## 2017-12-18 DIAGNOSIS — D539 Nutritional anemia, unspecified: Secondary | ICD-10-CM

## 2017-12-18 DIAGNOSIS — R55 Syncope and collapse: Secondary | ICD-10-CM

## 2017-12-18 LAB — HEMOGLOBIN AND HEMATOCRIT, BLOOD
HCT: 29.4 % — ABNORMAL LOW (ref 36.0–46.0)
HEMOGLOBIN: 9.8 g/dL — AB (ref 12.0–15.0)

## 2017-12-19 NOTE — Progress Notes (Signed)
Subjective:    Patient ID: [name], female    DOB: 11/24/1932, 84 y.o.   MRN: [id number]  HPI The patient is here for follow up from the hospital.  Admitted 12/14/17 - 12/15/17 for near syncope  Recommendations for primary care physician for things to follow:  -6 CBC in 1 week, if stable can resume aspirin, should be on hold until bed. -Continue with Protonix 40 mg twice daily for 66-month, 40 mg oral daily indefinitely -Need to follow with GI about H. pylori biopsy results   She went to the ED for weakness, dizziness and diaphoresis.  At bedtime the night before she started feeling uneasy and weak.  Her BP was low normal.  The symptoms returned later when she was brushing her teeth.  She was more diaphoretic and was brought to the ED. She had a similar episode three months ago in [place] and was found to be bradycardic.   In ED her BP was low normal and she received an IV fluid bolus.  EKG showed diffuse T wave changes.  Cardiology was consulted.  CXR was normal.  She was anemic.     Near syncope: Most likely due to acute blood loss anemia.  2D Echo showed no acute findings, no change in mitral valve.  Cardiology saw patient - felt symptoms were non cardiac and related to GI   Acute blood loss anemia: Likely UGIB Had endoscopy - 4 gastric ulcers with clean base, no active bleeding, gastritis protonix 40 mg BID, hold ASA for now Repeat labs done Friday - reviewed Follow up with GI as an outpatient - has appt, H.Pylori results showed no hpylori No symptoms of bleeding - no blood in stool or black stool.  She denies abdominal pain.   Hypertension: Pressure on low side Amlodipine stopped BP varies since being off the amlodipine - 133/56 - 146/? This morning.   She denies chest pain, sob and palpitations.   Hyperlipidemia: Statin continued  Anxiety:   The xanax does help - she has started to take it at night and rarely takes it during the day. She has cut down on her  sleeping medication and would like to take the xanax at night with the sleep medication - it really helps and she would like to take a low dose of both.    Medications and allergies reviewed with patient and updated if appropriate.  Patient Active Problem List   Diagnosis Date Noted  . Syncope 12/14/2017  . Macrocytic anemia 12/14/2017  . Near syncope 12/14/2017  . Left sided numbness 10/19/2017  . Small vessel disease, cerebrovascular 10/19/2017  . Numbness and tingling of left arm and leg 10/13/2017  . Anxiety 10/02/2017  . Right ear pain 09/04/2017  . Prediabetes 09/29/2016  . Lung nodule < 6cm on CT 09/29/2016  . Leg cramps 01/29/2016  . Insomnia 09/12/2015  . Rectal bleed 09/12/2015  . S/P AVR 11/16/2014  . Branch retinal vein occlusion 10/12/2013  . Aortic valve stenosis, severe   . ALLERGIC RHINITIS DUE TO OTHER ALLERGEN 09/10/2009  . Hyperlipidemia 11/30/2006  . HYPOGLYCEMIA NEC 11/20/2006  . MITRAL VALVE PROLAPSE 10/29/2006  . Osteopenia 10/29/2006  . History of right breast cancer 10/29/2006    Current Outpatient Medications on File Prior to Visit  Medication Sig Dispense Refill  . acetaminophen (TYLENOL) 500 MG tablet Take 500-1,000 mg by mouth at bedtime as needed for mild pain or moderate pain.    Marland Kitchen ALPRAZolam (XANAX) 0.5 MG  tablet Take 1 tablet (0.5 mg total) by mouth daily as needed for anxiety. Do not take at night 90 tablet 0  . Calcium Carb-Cholecalciferol (CALCIUM 600+D3) 600-400 MG-UNIT TABS Take 1 tablet by mouth daily.    . Chloral Hydrate CRYS Take 10 mLs by mouth at bedtime as needed (Call  liquid into [facility]). (Patient taking differently: Take 10 mLs by mouth at bedtime as needed (sleep). Call  liquid into [facility]) 900 g 1  . Cholecalciferol (VITAMIN D-3) 1000 UNITS CAPS Take 1 capsule by mouth daily.     . clindamycin (CLEOCIN) 300 MG capsule Take 600 mg by mouth as directed. Take 600 mg (2 capsules) by mouth one  hour prior to dental procedures    . Coenzyme Q10 (CO Q 10 PO) Take 1 capsule by mouth daily.    . Magnesium 400 MG CAPS Take 400 mg by mouth every morning.     Marland Kitchen Ophthalmic Irrigation Solution (OCUSOFT EYE [place] OP) Apply 1 application to eye 2 (two) times daily as needed (dry eyes). Morning and before bed    . pantoprazole (PROTONIX) 40 MG tablet Take 1 tablet (40 mg total) by mouth 2 (two) times daily. 60 tablet 3  . Polyethyl Glycol-Propyl Glycol (SYSTANE ULTRA PF) 0.4-0.3 % SOLN Apply 1 drop to eye 2 (two) times daily.    . rosuvastatin (CRESTOR) 10 MG tablet Take 1 tablet (10 mg total) by mouth daily. 90 tablet 1  . Wheat Dextrin (BENEFIBER DRINK MIX PO) Take 5 mLs by mouth every morning. Mix with 1/2 a glass water, and takes with vitamins     No current facility-administered medications on file prior to visit.     Past Medical History:  Diagnosis Date  . Aortic valve stenosis, severe   . Breast cancer ([place])    right  . Hiatal hernia   . Hypercholesteremia   . Hypoglycemia   . Osteopenia   . Tingling   . Ulcer 2012   bleeding gastric Ulcer    Past Surgical History:  Procedure Laterality Date  . AORTIC VALVE REPLACEMENT N/A 11/16/2014   Procedure: AORTIC VALVE REPLACEMENT (AVR);  Surgeon: Gaye Pollack, MD;  Location: [facility];  Service: Open Heart Surgery;  Laterality: N/A;  . BIOPSY  12/15/2017   Procedure: BIOPSY;  Surgeon: Rush Landmark Telford Nab., MD;  Location: [facility];  Service: Gastroenterology;;  . BREAST SURGERY    . CARDIAC CATHETERIZATION N/A 10/18/2014   Procedure: Right/Left Heart Cath and Coronary Angiography;  Surgeon: [name], MD;  Location: [facility];  Service: Cardiovascular;  Laterality: N/A;  . COSMETIC SURGERY     face  . ESOPHAGOGASTRODUODENOSCOPY (EGD) WITH PROPOFOL N/A 12/15/2017   Procedure: ESOPHAGOGASTRODUODENOSCOPY (EGD) WITH PROPOFOL;  Surgeon: Rush Landmark Telford Nab., MD;  Location: [facility];  Service: Gastroenterology;   Laterality: N/A;  . PARATHYROIDECTOMY    . TEE WITHOUT CARDIOVERSION N/A 11/16/2014   Procedure: TRANSESOPHAGEAL ECHOCARDIOGRAM (TEE);  Surgeon: Gaye Pollack, MD;  Location: [facility];  Service: Open Heart Surgery;  Laterality: N/A;  . TONSILLECTOMY      Social History   Socioeconomic History  . Marital status: Married    Spouse name: Not on file  . Number of children: 3  . Years of education: 16  . Highest education level: Bachelor's degree (e.g., BA, AB, BS)  Occupational History  . Occupation: Retired  Scientific laboratory technician  . Financial resource strain: Not hard at all  . Food  insecurity:    Worry: Never true    Inability: Never true  . Transportation needs:    Medical: No    Non-medical: No  Tobacco Use  . Smoking status: Former Smoker    Packs/day: 0.50    Years: 30.00    Pack years: 15.00    Types: Cigarettes  . Smokeless tobacco: Never Used  . Tobacco comment: quit smoking 1980  Substance and Sexual Activity  . Alcohol use: Yes    Alcohol/week: 7.0 standard drinks    Types: 7 Glasses of wine per week    Comment: social  . Drug use: No  . Sexual activity: Not on file  Lifestyle  . Physical activity:    Days per week: 0 days    Minutes per session: 0 min  . Stress: Rather much  Relationships  . Social connections:    Talks on phone: More than three times a week    Gets together: More than three times a week    Attends religious service: Not on file    Active member of club or organization: Not on file    Attends meetings of clubs or organizations: Not on file    Relationship status: Married  Other Topics Concern  . Not on file  Social History Narrative   L[facility] with her husband.   Right-handed.   Caffeine use: 3-4 cups per day (some tea, mixes coffee with decaf).    Family History  Problem Relation Age of Onset  . Hypertension Mother   . Rectal cancer Mother   . Colon cancer Mother   . Kidney disease Father     Review of Systems    Constitutional: Positive for appetite change (reduced). Negative for chills and fever.  Respiratory: Negative for shortness of breath.   Cardiovascular: Negative for chest pain, palpitations and leg swelling.  Gastrointestinal: Positive for abdominal pain (discomfort in pelvic area - intermittent) and constipation (taking benefiber). Negative for anal bleeding, blood in stool (no black stool) and nausea.  Neurological: Positive for light-headedness (minor amount) and headaches (with elevated BP or lack of coffee). Negative for dizziness.  Psychiatric/Behavioral: The patient is nervous/anxious.        Objective:   Vitals:   12/21/17 1017  BP: (!) 148/72  Pulse: (!) 56  Resp: 16  Temp: 97.6 F (36.4 C)  SpO2: 97%   BP Readings from Last 3 Encounters:  12/21/17 (!) 148/72  12/15/17 (!) 115/43  11/13/17 (!) 129/56   Wt Readings from Last 3 Encounters:  12/21/17 154 lb 12.8 oz (70.2 kg)  12/15/17 152 lb 1.6 oz (69 kg)  11/13/17 155 lb 12.8 oz (70.7 kg)   Body mass index is 25.76 kg/m.   Physical Exam    Constitutional: Appears well-developed and well-nourished. No distress.  HENT:  Head: Normocephalic and atraumatic.  Neck: Neck supple. No tracheal deviation present. No thyromegaly present.  No cervical lymphadenopathy Cardiovascular: Normal rate, regular rhythm and normal heart sounds.   No murmur heard. No carotid bruit .  No edema Pulmonary/Chest: Effort normal and breath sounds normal. No respiratory distress. No has no wheezes. No rales.  Abdomen: soft, ND, NT Skin: Skin is warm and dry. Not diaphoretic.  Psychiatric: Normal mood and affect. Behavior is normal.   ECHOCARDIOGRAM COMPLETE                            *[facility]*                   *  [address] [place], [place] [address]                             [phone]  ------------------------------------------------------------------- Transthoracic Echocardiography  Patient:    Laiyah, Exline MR #:       [id number] Study Date: 12/15/2017 Gender:     F Age:        25 Height:     165.1 cm Weight:     69 kg BSA:        1.79 m^2 Pt. Status: Room:       6E27C   ADMITTING    Rise Patience  ATTENDING    Elgergawy, Dawood Verlin Grills     Elgergawy, Dawood S  REFERRING    Elgergawy, Silver Huguenin  PERFORMING   [facility], Inpatient  SONOGRAPHER  Mikki Santee  cc:  ------------------------------------------------------------------- LV EF: 65% -   70%  ------------------------------------------------------------------- Indications:      Syncope 780.2.  ------------------------------------------------------------------- Study Conclusions  - Left ventricle: The cavity size was normal. Wall thickness was   normal. Systolic function was vigorous. The estimated ejection   fraction was in the range of 65% to 70%. Doppler parameters are   consistent with abnormal left ventricular relaxation (grade 1   diastolic dysfunction). - Aortic valve: Bioprosthetic valve with mild AR cannot tell if   peri valvular or central Mean gradients and AR unchanged from   previous echo - Mitral valve: There was mild regurgitation. - Left atrium: The atrium was mildly dilated. - Atrial septum: No defect or patent foramen ovale was identified.  ------------------------------------------------------------------- Labs, prior tests, procedures, and surgery: Aortic valve replacement with a bioprosthetic valve.  ------------------------------------------------------------------- Study data:  No prior study was available for comparison.  Study status:  Routine.  Procedure:  The patient reported no pain pre or post test. Transthoracic echocardiography. Image quality was adequate.  Study completion:  There were no complications. Transthoracic  echocardiography.  M-mode, complete 2D, spectral Doppler, and color Doppler.  Birthdate:  Patient birthdate: 12-Jul-1932.  Age:  Patient is 83 yr old.  Sex:  Gender: female. BMI: 25.3 kg/m^2.  Blood pressure:     115/43  Patient status: Inpatient.  Study date:  Study date: 12/15/2017. Study time: 03:51 PM.  Location:  Echo laboratory.  -------------------------------------------------------------------  ------------------------------------------------------------------- Left ventricle:  The cavity size was normal. Wall thickness was normal. Systolic function was vigorous. The estimated ejection fraction was in the range of 65% to 70%. Doppler parameters are consistent with abnormal left ventricular relaxation (grade 1 diastolic dysfunction).  ------------------------------------------------------------------- Aortic valve:  Bioprosthetic valve with mild AR cannot tell if peri valvular or central Mean gradients and AR unchanged from previous echo  Doppler:     VTI ratio of LVOT to aortic valve: 0.35. Peak velocity ratio of LVOT to aortic valve: 0.3. Mean velocity ratio of LVOT to aortic valve: 0.27.    Mean gradient (S): 18 mm Hg. Peak gradient (S): 44 mm Hg.  ------------------------------------------------------------------- Aorta:  The aorta was normal, not dilated, and non-diseased.  ------------------------------------------------------------------- Mitral valve:   Mildly thickened leaflets .  Doppler:  There was mild regurgitation.  Peak gradient (D): 2 mm Hg.  ------------------------------------------------------------------- Left atrium:  The atrium was mildly dilated.  ------------------------------------------------------------------- Atrial septum:  No defect or patent foramen ovale was identified.   ------------------------------------------------------------------- Right ventricle:  The cavity size was normal. Wall thickness was normal. Systolic function was  normal.  ------------------------------------------------------------------- Pulmonic valve:    Doppler:  There was mild regurgitation.  ------------------------------------------------------------------- Tricuspid valve:   Doppler:  There was mild regurgitation.  ------------------------------------------------------------------- Right atrium:  The atrium was normal in size.  ------------------------------------------------------------------- Pericardium:  The pericardium was normal in appearance.  ------------------------------------------------------------------- Systemic veins: Inferior vena cava: The vessel was normal in size. The respirophasic diameter changes were in the normal range (>= 50%), consistent with normal central venous pressure.  ------------------------------------------------------------------- Post procedure conclusions Ascending Aorta:  - The aorta was normal, not dilated, and non-diseased.  ------------------------------------------------------------------- Measurements   Left ventricle                         Value        Reference  LV ID, ED, PLAX chordal        (L)     41.9  mm     43 - 52  LV ID, ES, PLAX chordal        (L)     20.6  mm     23 - 38  LV fx shortening, PLAX chordal         51    %      >=29  LV PW thickness, ED                    9.28  mm     ----------  IVS/LV PW ratio, ED                    1.04         <=1.3  LV e&', lateral                         7.07  cm/s   ----------  LV E/e&', lateral                       10.91        ----------  LV e&', medial                          5.55  cm/s   ----------  LV E/e&', medial                        13.89        ----------  LV e&', average                         6.31  cm/s   ----------  LV E/e&', average                       12.22        ----------    Ventricular septum                     Value        Reference  IVS thickness, ED                      9.69  mm     ----------  LVOT                                    Value        Reference  LVOT peak velocity, S                  99    cm/s   ----------  LVOT mean velocity, S                  51.1  cm/s   ----------  LVOT VTI, S                            20.7  cm     ----------  LVOT peak gradient, S                  4     mm Hg  ----------    Aortic valve                           Value        Reference  Aortic valve peak velocity, S          330   cm/s   ----------  Aortic valve mean velocity, S          191   cm/s   ----------  Aortic valve VTI, S                    59.6  cm     ----------  Aortic mean gradient, S                18    mm Hg  ----------  Aortic peak gradient, S                44    mm Hg  ----------  VTI ratio, LVOT/AV                     0.35         ----------  Velocity ratio, peak, LVOT/AV          0.3          ----------  Velocity ratio, mean, LVOT/AV          0.27         ----------    Aorta                                  Value        Reference  Aortic root ID, ED                     25    mm     ----------    Left atrium                            Value        Reference  LA ID, A-P, ES                         41    mm     ----------  LA ID/bsa, A-P                 (  H)     2.29  cm/m^2 <=2.2  LA volume, S                           61.3  ml     ----------  LA volume/bsa, S                       34.2  ml/m^2 ----------  LA volume, ES, 1-p A4C                 53.4  ml     ----------  LA volume/bsa, ES, 1-p A4C             29.8  ml/m^2 ----------  LA volume, ES, 1-p A2C                 71.6  ml     ----------  LA volume/bsa, ES, 1-p A2C             40    ml/m^2 ----------    Mitral valve                           Value        Reference  Mitral E-wave peak velocity            77.1  cm/s   ----------  Mitral A-wave peak velocity            96    cm/s   ----------  Mitral deceleration time       (H)     398   ms     150 - 230  Mitral peak gradient, D                2     mm Hg  ----------   Mitral E/A ratio, peak                 0.8          ----------    Pulmonary arteries                     Value        Reference  PA pressure, S, DP                     27    mm Hg  <=30    Tricuspid valve                        Value        Reference  Tricuspid regurg peak velocity         246   cm/s   ----------  Tricuspid peak RV-RA gradient          24    mm Hg  ----------    Right atrium                           Value        Reference  RA ID, S-I, ES, A4C            (H)     54    mm     34 - 49  RA area, ES, A4C  14.8  cm^2   8.3 - 19.5  RA volume, ES, A/L                     34.3  ml     ----------  RA volume/bsa, ES, A/L                 19.2  ml/m^2 ----------    Systemic veins                         Value        Reference  Estimated CVP                          3     mm Hg  ----------    Right ventricle                        Value        Reference  TAPSE                                  15.2  mm     ----------  RV pressure, S, DP                     27    mm Hg  <=30  RV s&', lateral, S                      14.4  cm/s   ----------  Legend: (L)  and  (H)  mark values outside specified reference range.  ------------------------------------------------------------------- Prepared and Electronically Authenticated by  [name] Rouge, M.D. 2019-08-20T17:13:47   Lab Results  Component Value Date   WBC 7.0 12/15/2017   HGB 9.8 (L) 12/18/2017   HCT 29.4 (L) 12/18/2017   PLT 137 (L) 12/15/2017   GLUCOSE 118 (H) 12/14/2017   CHOL 263 (H) 10/02/2017   TRIG 94.0 10/02/2017   HDL 84.20 10/02/2017   LDLDIRECT 144.0 11/29/2012   LDLCALC 160 (H) 10/02/2017   ALT 12 12/14/2017   AST 28 12/14/2017   NA 137 12/14/2017   K 4.8 12/14/2017   CL 110 12/14/2017   CREATININE 0.76 12/14/2017   BUN 38 (H) 12/14/2017   CO2 21 (L) 12/14/2017   TSH 0.69 10/02/2017   INR 1.02 12/14/2017   HGBA1C 5.9 10/02/2017     Assessment & Plan:    See Problem List for  Assessment and Plan of chronic medical problems.

## 2017-12-21 ENCOUNTER — Ambulatory Visit (INDEPENDENT_AMBULATORY_CARE_PROVIDER_SITE_OTHER): Payer: Medicare Other | Admitting: Internal Medicine

## 2017-12-21 ENCOUNTER — Encounter: Payer: Self-pay | Admitting: Internal Medicine

## 2017-12-21 DIAGNOSIS — I10 Essential (primary) hypertension: Secondary | ICD-10-CM | POA: Insufficient documentation

## 2017-12-21 DIAGNOSIS — R55 Syncope and collapse: Secondary | ICD-10-CM

## 2017-12-21 DIAGNOSIS — F5101 Primary insomnia: Secondary | ICD-10-CM

## 2017-12-21 DIAGNOSIS — D62 Acute posthemorrhagic anemia: Secondary | ICD-10-CM | POA: Insufficient documentation

## 2017-12-21 DIAGNOSIS — K279 Peptic ulcer, site unspecified, unspecified as acute or chronic, without hemorrhage or perforation: Secondary | ICD-10-CM | POA: Insufficient documentation

## 2017-12-21 DIAGNOSIS — Z8711 Personal history of peptic ulcer disease: Secondary | ICD-10-CM | POA: Insufficient documentation

## 2017-12-21 NOTE — Assessment & Plan Note (Signed)
Mild hypertension Amlodipine held during hospital course for hypotension secondary to GI bleed/anemia Still not taking amlodipine 2.5 mg daily Blood pressure at home is variable, but occasionally high We will continue to hold amlodipine for now and she will continue to monitor blood pressure.  If blood pressure is frequently more than 140/90 she will need to restart the medication and monitor her blood pressure closely

## 2017-12-21 NOTE — Patient Instructions (Addendum)
Have blood work done a couple of days prior to see GI.  Test(s) ordered today. Your results will be released to [facility] [place] (or called to you) after review, usually within 72hours after test completion. If any changes need to be made, you will be notified at that same time.  Continue to hold the aspirin.   Monitor your BP for now - restart the amlodipine if your BP is frequently getting over 140.    Medications reviewed and updated.  No changes recommended at this time.    Please followup in 6 months, sooner if needed

## 2017-12-21 NOTE — Assessment & Plan Note (Signed)
Takes chloral hydrate nightly and has been on that for years-she has decreased the dose and would like to take a small dose of alprazolam as well, which seems to be very effective for her anxiety from caring for her husband We will let her take both medication for now, but needs to keep doses to the minimum amount-will not increase either, which she understands

## 2017-12-21 NOTE — Assessment & Plan Note (Signed)
Cardiac evaluation in the hospital was negative Near syncope was related to upper GI bleed from peptic ulcer disease See below for treatment of peptic ulcer disease No further evaluation necessary for near syncope

## 2017-12-21 NOTE — Assessment & Plan Note (Addendum)
Related to upper GI bleed-peptic ulcer disease On pantoprazole twice daily No melena, blood in stool No upper GI symptoms Continue twice daily pantoprazole Has GI follow-up Check CBC prior to seeing GI Continue full aspirin for now since she is low risk, but ideally would like her taking it to reduce risk of stroke secondary to small vessel disease

## 2017-12-21 NOTE — Assessment & Plan Note (Signed)
Hospitalized for near syncope related to upper GI bleed from ulcer disease, which she has had in the past No evidence of continued bleeding Taking pantoprazole twice daily and will eventually decrease to once a day Aspirin on hold at this point she is overall low risk and will continue to hold the aspirin for now Recheck CBC, iron levels a couple of days prior to going to see GI, sooner if there is any evidence of rebleeding H. pylori biopsies negative

## 2017-12-24 ENCOUNTER — Encounter: Payer: Self-pay | Admitting: Gastroenterology

## 2018-01-04 ENCOUNTER — Other Ambulatory Visit (INDEPENDENT_AMBULATORY_CARE_PROVIDER_SITE_OTHER): Payer: Medicare Other

## 2018-01-04 DIAGNOSIS — K279 Peptic ulcer, site unspecified, unspecified as acute or chronic, without hemorrhage or perforation: Secondary | ICD-10-CM | POA: Diagnosis not present

## 2018-01-04 DIAGNOSIS — D62 Acute posthemorrhagic anemia: Secondary | ICD-10-CM

## 2018-01-04 LAB — CBC WITH DIFFERENTIAL/PLATELET
BASOS ABS: 0.1 10*3/uL (ref 0.0–0.1)
Basophils Relative: 1 % (ref 0.0–3.0)
EOS ABS: 0.1 10*3/uL (ref 0.0–0.7)
Eosinophils Relative: 1.8 % (ref 0.0–5.0)
HCT: 33.8 % — ABNORMAL LOW (ref 36.0–46.0)
Hemoglobin: 11.2 g/dL — ABNORMAL LOW (ref 12.0–15.0)
LYMPHS ABS: 2.5 10*3/uL (ref 0.7–4.0)
Lymphocytes Relative: 47.5 % — ABNORMAL HIGH (ref 12.0–46.0)
MCHC: 33.1 g/dL (ref 30.0–36.0)
MCV: 92.8 fl (ref 78.0–100.0)
MONO ABS: 0.4 10*3/uL (ref 0.1–1.0)
MONOS PCT: 8 % (ref 3.0–12.0)
Neutro Abs: 2.2 10*3/uL (ref 1.4–7.7)
Neutrophils Relative %: 41.7 % — ABNORMAL LOW (ref 43.0–77.0)
Platelets: 187 10*3/uL (ref 150.0–400.0)
RBC: 3.65 Mil/uL — AB (ref 3.87–5.11)
RDW: 13.9 % (ref 11.5–15.5)
WBC: 5.2 10*3/uL (ref 4.0–10.5)

## 2018-01-04 LAB — FERRITIN: FERRITIN: 13.8 ng/mL (ref 10.0–291.0)

## 2018-01-04 LAB — IRON: IRON: 26 ug/dL — AB (ref 42–145)

## 2018-01-05 ENCOUNTER — Encounter: Payer: Self-pay | Admitting: Internal Medicine

## 2018-01-06 ENCOUNTER — Encounter: Payer: Self-pay | Admitting: Nurse Practitioner

## 2018-01-06 ENCOUNTER — Ambulatory Visit (INDEPENDENT_AMBULATORY_CARE_PROVIDER_SITE_OTHER): Payer: Medicare Other | Admitting: Nurse Practitioner

## 2018-01-06 VITALS — BP 130/70 | HR 82 | Ht 65.0 in | Wt 154.0 lb

## 2018-01-06 DIAGNOSIS — K279 Peptic ulcer, site unspecified, unspecified as acute or chronic, without hemorrhage or perforation: Secondary | ICD-10-CM

## 2018-01-06 NOTE — Patient Instructions (Addendum)
If you are age 82 or older, your body mass index should be between 23-30. Your Body mass index is 25.63 kg/m. If this is out of the aforementioned range listed, please consider follow up with your Primary Care Provider.  If you are age 45 or younger, your body mass index should be between 19-25. Your Body mass index is 25.63 kg/m. If this is out of the aformentioned range listed, please consider follow up with your Primary Care Provider.   Continue Protonix twice daily. Call back to schedule EGD with Dr. Rush Landmark.  Thank you for choosing me and [facility].   Tye Savoy, NP

## 2018-01-06 NOTE — Progress Notes (Signed)
GI provider:    Would like to change to Dr. Rush Landmark      Complaint: hospital follow up     [employer];    84 yo female with recently hospitalized with recurrent gastric ulcers / GI bleeding. No malignancy nor H.pylori on biopsies. She was only on a baby asa but not taking a PPI.  -repeat EGD to document healing in late October. I will arrange this to be done with Dr. Rush Landmark -continue to avoid NSAIDs -continue PPI.  -repeat Hgb two day up to 11.2 so need for follow up CBC unless overt GI bleeding.  -answered her questions regarding any dietary strictures with PUD. Inquires about a glass of wine with dinner - it is okay.  -advised to limit caffeine, she doesn't consume much of it anyway.   Patient was extremely impressed with Dr. Donneta Romberg care in the hospital and would like for him to assume her care going forward since she hasn't seen Dr. Hilarie Fredrickson in years anyway. I will discuss with them.    HPI:    Patient is an 84 yo female with a history of recurrent gastric ulcers. In 2012 she underwent EGD for evaluation of melena, An antral ulcer was found, h. Pylori was negative so felt to be secondary to NSAIDS. Follow up EGD. Biopsies negative for malignancy. Patient admitted to the hospital 12/14/17 with anemia, pre-syncope and dark stool.  Inpatient EGD remarkable for small hiatal hernia, four non-bleeding cratered gastric ulcers with clean base, largest was 10 mm in size .She had been only on a daily baby asa, no other nsaids. There was erythema and congestion mucosa in the stomach. No h.pylori or malignancy on biopsies.  At baseline her hgb is 13-14, it declined to 8.9 in the hospital but she did not require blood.  Patient is here for hospital follow up. She feels well, no complaints but just has questions about diet. No further dark stools.  Hgb recheck 2 days ago was up to 11.2. Taking prescribed PPI. No nsaids.   Past Medical History:  Diagnosis Date  . Aortic  valve stenosis, severe   . Breast cancer ([place])    right  . Hiatal hernia   . Hypercholesteremia   . Hypoglycemia   . Osteopenia   . Tingling   . Ulcer 2012   bleeding gastric Ulcer     Past Surgical History:  Procedure Laterality Date  . AORTIC VALVE REPLACEMENT N/A 11/16/2014   Procedure: AORTIC VALVE REPLACEMENT (AVR);  Surgeon: Gaye Pollack, MD;  Location: [facility];  Service: Open Heart Surgery;  Laterality: N/A;  . BIOPSY  12/15/2017   Procedure: BIOPSY;  Surgeon: Rush Landmark Telford Nab., MD;  Location: [facility];  Service: Gastroenterology;;  . BREAST SURGERY    . CARDIAC CATHETERIZATION N/A 10/18/2014   Procedure: Right/Left Heart Cath and Coronary Angiography;  Surgeon: [name], MD;  Location: [facility];  Service: Cardiovascular;  Laterality: N/A;  . COSMETIC SURGERY     face  . ESOPHAGOGASTRODUODENOSCOPY (EGD) WITH PROPOFOL N/A 12/15/2017   Procedure: ESOPHAGOGASTRODUODENOSCOPY (EGD) WITH PROPOFOL;  Surgeon: Rush Landmark Telford Nab., MD;  Location: [facility];  Service: Gastroenterology;  Laterality: N/A;  . PARATHYROIDECTOMY    . TEE WITHOUT CARDIOVERSION N/A 11/16/2014   Procedure: TRANSESOPHAGEAL ECHOCARDIOGRAM (TEE);  Surgeon: Gaye Pollack, MD;  Location: [facility];  Service: Open Heart Surgery;  Laterality: N/A;  . TONSILLECTOMY     Family History  Problem Relation Age of Onset  .  Hypertension Mother   . Rectal cancer Mother   . Colon cancer Mother   . Kidney disease Father    Social History   Tobacco Use  . Smoking status: Former Smoker    Packs/day: 0.50    Years: 30.00    Pack years: 15.00    Types: Cigarettes  . Smokeless tobacco: Never Used  . Tobacco comment: quit smoking 1980  Substance Use Topics  . Alcohol use: Yes    Alcohol/week: 7.0 standard drinks    Types: 7 Glasses of wine per week    Comment: social  . Drug use: No   Current Outpatient Medications  Medication Sig Dispense Refill  . acetaminophen (TYLENOL) 500 MG tablet  Take 500-1,000 mg by mouth at bedtime as needed for mild pain or moderate pain.    Marland Kitchen ALPRAZolam (XANAX) 0.5 MG tablet Take 1 tablet (0.5 mg total) by mouth daily as needed for anxiety. Do not take at night 90 tablet 0  . Calcium Carb-Cholecalciferol (CALCIUM 600+D3) 600-400 MG-UNIT TABS Take 1 tablet by mouth daily.    . Chloral Hydrate CRYS Take 10 mLs by mouth at bedtime as needed (Call  liquid into [facility]). (Patient taking differently: Take 10 mLs by mouth at bedtime as needed (sleep). Call  liquid into [facility]) 900 g 1  . Cholecalciferol (VITAMIN D-3) 1000 UNITS CAPS Take 1 capsule by mouth daily.     . clindamycin (CLEOCIN) 300 MG capsule Take 600 mg by mouth as directed. Take 600 mg (2 capsules) by mouth one hour prior to dental procedures    . Coenzyme Q10 (CO Q 10 PO) Take 1 capsule by mouth daily.    . Magnesium 400 MG CAPS Take 400 mg by mouth every morning.     Marland Kitchen Ophthalmic Irrigation Solution (OCUSOFT EYE [place] OP) Apply 1 application to eye 2 (two) times daily as needed (dry eyes). Morning and before bed    . pantoprazole (PROTONIX) 40 MG tablet Take 1 tablet (40 mg total) by mouth 2 (two) times daily. 60 tablet 3  . Polyethyl Glycol-Propyl Glycol (SYSTANE ULTRA PF) 0.4-0.3 % SOLN Apply 1 drop to eye 2 (two) times daily.    . rosuvastatin (CRESTOR) 10 MG tablet Take 1 tablet (10 mg total) by mouth daily. 90 tablet 1  . Wheat Dextrin (BENEFIBER DRINK MIX PO) Take 5 mLs by mouth every morning. Mix with 1/2 a glass water, and takes with vitamins     No current facility-administered medications for this visit.    Allergies  Allergen Reactions  . Amoxicillin-Pot Clavulanate Hives and Rash  . Penicillin G Rash  . Penicillins Hives and Rash  . Sulfa Antibiotics Rash  . Sulfamethoxazole Rash  . Sulfonamide Derivatives Hives     Review of Systems: All systems reviewed and negative except where noted in HPI.   Creatinine clearance cannot be  calculated (Patient's most recent lab result is older than the maximum 21 days allowed.)   Physical Exam:    Wt Readings from Last 3 Encounters:  01/06/18 154 lb (69.9 kg)  12/21/17 154 lb 12.8 oz (70.2 kg)  12/15/17 152 lb 1.6 oz (69 kg)    BP 130/70   Pulse 82   Ht 5\' 5"  (1.651 m)   Wt 154 lb (69.9 kg)   BMI 25.63 kg/m  Constitutional:  Pleasant female in no acute distress. Psychiatric: Normal mood and affect. Behavior is normal. EENT: Pupils normal.  Conjunctivae are normal.  No scleral icterus. Neck supple.  Cardiovascular: Normal rate, regular rhythm. No edema Pulmonary/chest: Effort normal and breath sounds normal. No wheezing, rales or rhonchi. Abdominal: Soft, nondistended, nontender. Bowel sounds active throughout. There are no masses palpable. No hepatomegaly. Neurological: Alert and oriented to person place and time. Skin: Skin is warm and dry. No rashes noted.  Tye Savoy, NP  01/06/2018, 11:53 AM

## 2018-01-07 ENCOUNTER — Encounter: Payer: Self-pay | Admitting: Nurse Practitioner

## 2018-01-07 ENCOUNTER — Encounter: Payer: Self-pay | Admitting: Internal Medicine

## 2018-01-07 ENCOUNTER — Telehealth: Payer: Self-pay | Admitting: Internal Medicine

## 2018-01-07 NOTE — Telephone Encounter (Signed)
Please schedule pt for EGD toward the end of October. Needs EGD to assess for ulcer healing.

## 2018-01-08 ENCOUNTER — Telehealth: Payer: Self-pay | Admitting: Internal Medicine

## 2018-01-08 NOTE — Telephone Encounter (Signed)
Not my intention to move patients around between providers unless there are specific indications.  I defer to Dr. Hilarie Fredrickson her primary GI currently.

## 2018-01-08 NOTE — Progress Notes (Signed)
Agree with assessment and plan. Defer decision to Dr. Hilarie Fredrickson in regards to transition of care of patient.

## 2018-01-11 NOTE — Telephone Encounter (Signed)
Ok with me 

## 2018-01-11 NOTE — Telephone Encounter (Signed)
See notes below

## 2018-01-12 ENCOUNTER — Telehealth: Payer: Self-pay | Admitting: Nurse Practitioner

## 2018-01-12 NOTE — Telephone Encounter (Signed)
Patient is advised and instructed on the PPI. She thanks me for the call and states Dr Rush Landmark is "so wonderful."

## 2018-01-12 NOTE — Telephone Encounter (Signed)
Patient wanting to know if it is okay that she take .81mg  Asprin or does she need to wait till after egd procedure in Oct 2019. Please advise. APP Nevin Bloodgood last to see pt on 9.11.19 but pt transferred care from Dr.Pyrtle to Dr.Mansouraty.

## 2018-01-12 NOTE — Telephone Encounter (Signed)
"  Please ask Dr. Gilles Chiquito if I should resume my .81 aspirin per day with my vitamins."

## 2018-01-12 NOTE — Telephone Encounter (Signed)
I think it is okay for patient to remain on aspirin/restart aspirin.   However with that being said she must remain on a PPI indefinitely moving forward. Okay to restart before EGD if patient desires.

## 2018-01-15 ENCOUNTER — Ambulatory Visit (INDEPENDENT_AMBULATORY_CARE_PROVIDER_SITE_OTHER): Payer: Medicare Other

## 2018-01-15 DIAGNOSIS — Z23 Encounter for immunization: Secondary | ICD-10-CM | POA: Diagnosis not present

## 2018-01-21 DIAGNOSIS — Z113 Encounter for screening for infections with a predominantly sexual mode of transmission: Secondary | ICD-10-CM | POA: Diagnosis not present

## 2018-01-21 DIAGNOSIS — Z124 Encounter for screening for malignant neoplasm of cervix: Secondary | ICD-10-CM | POA: Diagnosis not present

## 2018-01-22 DIAGNOSIS — L738 Other specified follicular disorders: Secondary | ICD-10-CM | POA: Diagnosis not present

## 2018-01-22 DIAGNOSIS — L821 Other seborrheic keratosis: Secondary | ICD-10-CM | POA: Diagnosis not present

## 2018-01-22 DIAGNOSIS — L820 Inflamed seborrheic keratosis: Secondary | ICD-10-CM | POA: Diagnosis not present

## 2018-01-22 DIAGNOSIS — D225 Melanocytic nevi of trunk: Secondary | ICD-10-CM | POA: Diagnosis not present

## 2018-01-22 DIAGNOSIS — D2261 Melanocytic nevi of right upper limb, including shoulder: Secondary | ICD-10-CM | POA: Diagnosis not present

## 2018-01-22 DIAGNOSIS — D692 Other nonthrombocytopenic purpura: Secondary | ICD-10-CM | POA: Diagnosis not present

## 2018-01-22 DIAGNOSIS — L723 Sebaceous cyst: Secondary | ICD-10-CM | POA: Diagnosis not present

## 2018-02-06 ENCOUNTER — Encounter: Payer: Self-pay | Admitting: Internal Medicine

## 2018-02-08 ENCOUNTER — Other Ambulatory Visit: Payer: Self-pay

## 2018-02-08 ENCOUNTER — Other Ambulatory Visit: Payer: Self-pay | Admitting: Internal Medicine

## 2018-02-08 MED ORDER — CHLORAL HYDRATE CRYS: 10.0000 mL | CRYSTALS | Freq: Every evening | 1 refills | Status: DC | PRN

## 2018-02-08 NOTE — Telephone Encounter (Signed)
Last refill was 11/04/17 Last OV was 12/21/17 Next OV is 06/24/18

## 2018-02-09 ENCOUNTER — Ambulatory Visit (INDEPENDENT_AMBULATORY_CARE_PROVIDER_SITE_OTHER): Payer: Medicare Other | Admitting: Neurology

## 2018-02-09 ENCOUNTER — Encounter: Payer: Self-pay | Admitting: Neurology

## 2018-02-09 VITALS — BP 129/73 | HR 64 | Ht 65.0 in | Wt 156.0 lb

## 2018-02-09 DIAGNOSIS — R202 Paresthesia of skin: Secondary | ICD-10-CM

## 2018-02-09 DIAGNOSIS — R200 Anesthesia of skin: Secondary | ICD-10-CM

## 2018-02-09 NOTE — Progress Notes (Signed)
PATIENT: [name] DOB: 05/07/1932  Chief Complaint  Patient presents with  . Follow-up    PCP: Dr. Claudine Mouton. RM 4, alone. Numbness has resolved.     HISTORICAL  [name] is a 84 years old female, seen in refer by her primary care physician Dr. Quay Burow, Marzetta Board for evaluation of numbness, initial evaluation was on October 19, 2017,  She has past medical history of hypertension, hyperlipidemia, and recently moved back from [place] to [place], lived [facility], she continued to play bridge few times each week,  She reported few episode of sudden onset paresthesia, initial episode was on October 08, 2017, she was walking with her husband, sudden onset of left arm and leg numbness lasting for few minutes, noticed mild left leg weakness, she has to lean on her husband for some help, but was able to continue finish her walking for 30 minutes,  Second episode was on October 11 2017, sudden onset of left arm numbness last for couple minutes, denies weakness,  She reported excessive stress with her recent move, and also her husband has mild memory loss, she felt very nervous coming to doctor's appointment, has been taking Xanax as needed to relax and sleep, she is also taking aspirin 81 mg daily,  She had a history of left breast cancer, status post left lobectomy, did have left great patient.  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  Echocardiogram showed ejection fraction of 65 to 70%, wall thickness was normal.  Laboratory evaluation in September 2019 showed mild anemia hemoglobin of 11.2, iron level of 26, ferritin was 13.8,  Reviewed hospital discharge home December 15, 2017, she had a history of bioprosthetic aortic valve replacement was admitted to the hospital on December 14, 2017 for spells of dizziness, with weakness, diaphoresis, she was diagnosed with near syncope likely related to acute blood loss anemia, endoscopy by GI, found for gastric ulcer, on high-dose Protonix 40 mg twice a day,  She is doing well now, has no recurrent paresthesia  REVIEW OF SYSTEMS: Full 14 system review of systems performed and notable only for headache, easy bruising, easy bleeding, allergy, cough, eye pain  ALLERGIES: Allergies  Allergen Reactions  . Amoxicillin-Pot Clavulanate Hives and Rash  . Penicillin G Rash  . Penicillins Hives and Rash  . Sulfa Antibiotics Rash  . Sulfamethoxazole Rash  . Sulfonamide Derivatives Hives    HOME MEDICATIONS: Current Outpatient Medications  Medication Sig Dispense Refill  . acetaminophen (TYLENOL) 500 MG tablet Take 500-1,000 mg by mouth at bedtime as needed for mild pain or moderate pain.    Marland Kitchen ALPRAZolam (XANAX) 0.5 MG tablet Take 1 tablet (0.5 mg total) by mouth daily as needed for anxiety. Do not take at night 90 tablet 0  . Calcium Carb-Cholecalciferol (CALCIUM 600+D3) 600-400 MG-UNIT TABS Take 1 tablet by mouth daily.    Marland Kitchen  Chloral Hydrate CRYS Take 10 mLs by mouth at bedtime as needed (Call  liquid into [facility]). 900 g 1  . Cholecalciferol (VITAMIN D-3) 1000 UNITS CAPS Take 1 capsule by mouth daily.     . clindamycin (CLEOCIN) 300 MG capsule Take 600 mg by mouth as directed. Take 600 mg (2 capsules) by mouth one hour prior to dental  procedures    . Coenzyme Q10 (CO Q 10 PO) Take 1 capsule by mouth daily.    . Magnesium 400 MG CAPS Take 400 mg by mouth every morning.     Marland Kitchen Ophthalmic Irrigation Solution (OCUSOFT EYE [place] OP) Apply 1 application to eye 2 (two) times daily as needed (dry eyes). Morning and before bed    . pantoprazole (PROTONIX) 40 MG tablet Take 1 tablet (40 mg total) by mouth 2 (two) times daily. 60 tablet 3  . Polyethyl Glycol-Propyl Glycol (SYSTANE ULTRA PF) 0.4-0.3 % SOLN Apply 1 drop to eye 2 (two) times daily.    . rosuvastatin (CRESTOR) 10 MG tablet Take 1 tablet (10 mg total) by mouth daily. 90 tablet 1  . Wheat Dextrin (BENEFIBER DRINK MIX PO) Take 5 mLs by mouth every morning. Mix with 1/2 a glass water, and takes with vitamins     No current facility-administered medications for this visit.     PAST MEDICAL HISTORY: Past Medical History:  Diagnosis Date  . Aortic valve stenosis, severe   . Breast cancer ([place])    right  . Hiatal hernia   . Hypercholesteremia   . Hypoglycemia   . Osteopenia   . Tingling   . Ulcer 2012   bleeding gastric Ulcer    PAST SURGICAL HISTORY: Past Surgical History:  Procedure Laterality Date  . AORTIC VALVE REPLACEMENT N/A 11/16/2014   Procedure: AORTIC VALVE REPLACEMENT (AVR);  Surgeon: Gaye Pollack, MD;  Location: [facility];  Service: Open Heart Surgery;  Laterality: N/A;  . BIOPSY  12/15/2017   Procedure: BIOPSY;  Surgeon: Rush Landmark Telford Nab., MD;  Location: [facility];  Service: Gastroenterology;;  . BREAST SURGERY    . CARDIAC CATHETERIZATION N/A 10/18/2014   Procedure: Right/Left Heart Cath and Coronary Angiography;  Surgeon: [name], MD;  Location: [facility];  Service: Cardiovascular;  Laterality: N/A;  . COSMETIC SURGERY     face  . ESOPHAGOGASTRODUODENOSCOPY (EGD) WITH PROPOFOL N/A 12/15/2017   Procedure: ESOPHAGOGASTRODUODENOSCOPY (EGD) WITH PROPOFOL;  Surgeon: Rush Landmark Telford Nab., MD;  Location: [place];  Service:  Gastroenterology;  Laterality: N/A;  . PARATHYROIDECTOMY    . TEE WITHOUT CARDIOVERSION N/A 11/16/2014   Procedure: TRANSESOPHAGEAL ECHOCARDIOGRAM (TEE);  Surgeon: Gaye Pollack, MD;  Location: [facility];  Service: Open Heart Surgery;  Laterality: N/A;  . TONSILLECTOMY      FAMILY HISTORY: Family History  Problem Relation Age of Onset  . Hypertension Mother   . Rectal cancer Mother   . Colon cancer Mother   . Kidney disease Father     SOCIAL HISTORY:  Social History   Socioeconomic History  . Marital status: Married    Spouse name: Not on file  . Number of children: 3  . Years of education: 16  . Highest education level: Bachelor's degree (e.g., BA, AB, BS)  Occupational History  . Occupation: Retired  Scientific laboratory technician  . Financial resource strain: Not hard at all  . Food insecurity:    Worry: Never true    Inability: Never true  . Transportation needs:  Medical: No    Non-medical: No  Tobacco Use  . Smoking status: Former Smoker    Packs/day: 0.50    Years: 30.00    Pack years: 15.00    Types: Cigarettes  . Smokeless tobacco: Never Used  . Tobacco comment: quit smoking 1980  Substance and Sexual Activity  . Alcohol use: Yes    Alcohol/week: 7.0 standard drinks    Types: 7 Glasses of wine per week    Comment: social  . Drug use: No  . Sexual activity: Not on file  Lifestyle  . Physical activity:    Days per week: 0 days    Minutes per session: 0 min  . Stress: Rather much  Relationships  . Social connections:    Talks on phone: More than three times a week    Gets together: More than three times a week    Attends religious service: Not on file    Active member of club or organization: Not on file    Attends meetings of clubs or organizations: Not on file    Relationship status: Married  . Intimate partner violence:    Fear of current or ex partner: No    Emotionally abused: No    Physically abused: No    Forced sexual activity: No  Other Topics Concern    . Not on file  Social History Narrative   Lives at [facility] with her husband.   Right-handed.   Caffeine use: 3-4 cups per day (some tea, mixes coffee with decaf).     PHYSICAL EXAM   Vitals:   02/09/18 1440  BP: 129/73  Pulse: 64  Weight: 156 lb (70.8 kg)  Height: 5\' 5"  (1.651 m)    Not recorded      Body mass index is 25.96 kg/m.  PHYSICAL EXAMNIATION:  Gen: NAD, conversant, well nourised, obese, well groomed                     Cardiovascular: Regular rate rhythm, no peripheral edema, warm, nontender. Eyes: Conjunctivae clear without exudates or hemorrhage Neck: Supple, no carotid bruits. Pulmonary: Clear to auscultation bilaterally   NEUROLOGICAL EXAM:  MENTAL STATUS: Speech:    Speech is normal; fluent and spontaneous with normal comprehension.  Cognition:     Orientation to time, place and person     Normal recent and remote memory     Normal Attention span and concentration     Normal Language, naming, repeating,spontaneous speech     Fund of knowledge   CRANIAL NERVES: CN II: Visual fields are full to confrontation. Fundoscopic exam is normal with sharp discs and no vascular changes. Pupils are round equal and briskly reactive to light. CN III, IV, VI: extraocular movement are normal. No ptosis. CN V: Facial sensation is intact to pinprick in all 3 divisions bilaterally. Corneal responses are intact.  CN VII: Face is symmetric with normal eye closure and smile. CN VIII: Hearing is normal to rubbing fingers CN IX, X: Palate elevates symmetrically. Phonation is normal. CN XI: Head turning and shoulder shrug are intact CN XII: Tongue is midline with normal movements and no atrophy.  MOTOR: There is no pronator drift of out-stretched arms. Muscle bulk and tone are normal. Muscle strength is normal.  REFLEXES: Reflexes are 2+ and symmetric at the biceps, triceps, knees, and ankles. Plantar responses are flexor.  SENSORY: Intact to light touch,  pinprick, positional sensation and vibratory sensation are intact in fingers and toes.  COORDINATION: Rapid alternating movements and fine finger movements are intact. There is no dysmetria on finger-to-nose and heel-knee-shin.    GAIT/STANCE: Posture is normal. Gait is steady with normal steps, base, arm swing, and turning. Heel and toe walking are normal. Tandem gait is normal.  Romberg is absent.   DIAGNOSTIC DATA (LABS, IMAGING, TESTING) - I reviewed patient records, labs, notes, testing and imaging myself where available.   ASSESSMENT AND PLAN  [name] is a 84 y.o. female   Transient left upper extremity paresthesia  TIA, versus stress related  MRI of the brain showed no acute abnormality, there is evidence of supratentorium and brain stem small vessel disease  She has vascular risk factor of hypertension, hyperlipidemia  Continue aspirin 81mg  daily  US carotid artery showed less than 39% stenosis, ECHO showed no significant abnormality  Continue moderate exercise.   Marcial Pacas, M.D. Ph.D.  [facility] [address] [place], [place] [address] Ph: [phone] Fax: [phone]  CC:  [name], MD

## 2018-02-10 ENCOUNTER — Ambulatory Visit (AMBULATORY_SURGERY_CENTER): Payer: Self-pay | Admitting: *Deleted

## 2018-02-10 VITALS — Ht 65.0 in | Wt 156.0 lb

## 2018-02-10 DIAGNOSIS — K253 Acute gastric ulcer without hemorrhage or perforation: Secondary | ICD-10-CM

## 2018-02-10 NOTE — Progress Notes (Signed)
Patient denies any allergies to eggs or soy. Patient denies any problems with anesthesia/sedation. Patient denies any oxygen use at home. Patient denies taking any diet/weight loss medications or blood thinners. Upper endo. Pamphlet given to pt.

## 2018-02-23 ENCOUNTER — Encounter: Payer: Self-pay | Admitting: Gastroenterology

## 2018-02-23 ENCOUNTER — Ambulatory Visit (AMBULATORY_SURGERY_CENTER): Payer: Medicare Other | Admitting: Gastroenterology

## 2018-02-23 VITALS — BP 125/60 | HR 51 | Temp 97.3°F | Resp 12 | Ht 65.0 in | Wt 156.0 lb

## 2018-02-23 DIAGNOSIS — K253 Acute gastric ulcer without hemorrhage or perforation: Secondary | ICD-10-CM | POA: Diagnosis not present

## 2018-02-23 DIAGNOSIS — K259 Gastric ulcer, unspecified as acute or chronic, without hemorrhage or perforation: Secondary | ICD-10-CM | POA: Diagnosis not present

## 2018-02-23 DIAGNOSIS — K449 Diaphragmatic hernia without obstruction or gangrene: Secondary | ICD-10-CM | POA: Diagnosis not present

## 2018-02-23 DIAGNOSIS — K3189 Other diseases of stomach and duodenum: Secondary | ICD-10-CM

## 2018-02-23 MED ORDER — SODIUM CHLORIDE 0.9 % IV SOLN: 500.0000 mL | Freq: Once | INTRAVENOUS | Status: DC

## 2018-02-23 NOTE — Op Note (Signed)
[facility] Patient Name: [name] Procedure Date: 02/23/2018 10:19 AM MRN: [id number] Endoscopist: Justice Britain , MD Age: 84 Referring MD:  Date of Birth: [birth year] Gender: Female Account #: [id number] Procedure:                Upper GI endoscopy Indications:              Follow-up of gastric ulcer Medicines:                Monitored Anesthesia Care Procedure:                Pre-Anesthesia Assessment:                           - Prior to the procedure, a History and Physical                            was performed, and patient medications and                            allergies were reviewed. The patient's tolerance of                            previous anesthesia was also reviewed. The risks                            and benefits of the procedure and the sedation                            options and risks were discussed with the patient.                            All questions were answered, and informed consent                            was obtained. Prior Anticoagulants: The patient has                            taken no previous anticoagulant or antiplatelet                            agents. ASA Grade Assessment: II - A patient with                            mild systemic disease. After reviewing the risks                            and benefits, the patient was deemed in                            satisfactory condition to undergo the procedure.                           After obtaining informed consent, the endoscope was  passed under direct vision. Throughout the                            procedure, the patient's blood pressure, pulse, and                            oxygen saturations were monitored continuously. The                            Model GIF-HQ190 [id number]) scope was introduced                            through the mouth, and advanced to the second part                            of duodenum. The  upper GI endoscopy was                            accomplished without difficulty. The patient                            tolerated the procedure. Scope In: Scope Out: Findings:                 No gross lesions were noted in the entire esophagus.                           The Z-line was regular and was found 37 cm from the                            incisors.                           A small hiatal hernia was present.                           A healing, non-bleeding superficial gastric ulcer                            was found in the gastric antrum. The lesion was                            approximately 10-14 mm in largest dimension.                            Biopsies were taken with a cold forceps for                            histology.                           No other gross lesions were noted in the entire                            examined stomach. Biopsies were taken with a cold  forceps for histology and Helicobacter pylori                            testing from the antrum/incisura/lesser                            curve/greater curve.                           The duodenal bulb was normal.                           A single 4 mm nodule was found in the second                            portion of the duodenum. Biopsies were taken with a                            cold forceps for histology. Consistent most likely                            with a lacteal. Complications:            No immediate complications. Estimated Blood Loss:     Estimated blood loss was minimal. Impression:               - No gross lesions in esophagus. Z-line regular, 37                            cm from the incisors. Small hiatal hernia.                           - Non-bleeding gastric ulcer. Biopsied.                           - No other gross lesions in the stomach. Biopsied                            for HP.                           - Normal duodenal bulb.                            - Nodule found in the duodenum - likely lacteal.                            Biopsied. Recommendation:           - The patient will be observed post-procedure,                            until all discharge criteria are met.                           - Discharge patient to home.                           -  Patient has a contact number available for                            emergencies. The signs and symptoms of potential                            delayed complications were discussed with the                            patient. Return to normal activities tomorrow.                            Written discharge instructions were provided to the                            patient.                           - Resume previous diet.                           - Continue present medications.                           - Await pathology results.                           - Repeat upper endoscopy in 2 months to check                            healing.                           - Continue PPI BID for 2-more months to allow                            healing of the ulcer even further. Then may                            decrease to once daily dosing for 23-month.                           - The findings and recommendations were discussed                            with the patient.                           - The findings and recommendations were discussed                            with the patient's family. GJustice Britain MD 02/23/2018 10:44:55 AM

## 2018-02-23 NOTE — Progress Notes (Signed)
Pt's states no medical or surgical changes since previsit or office visit. 

## 2018-02-23 NOTE — Progress Notes (Signed)
Called to room to assist during endoscopic procedure.  Patient ID and intended procedure confirmed with present staff. Received instructions for my participation in the procedure from the performing physician.  

## 2018-02-23 NOTE — Progress Notes (Signed)
To PACU, VSS. Report to [email or web address] 

## 2018-02-23 NOTE — Patient Instructions (Signed)
YOU HAD AN ENDOSCOPIC PROCEDURE TODAY AT [facility]:   Refer to the procedure report that was given to you for any specific questions about what was found during the examination.  If the procedure report does not answer your questions, please call your gastroenterologist to clarify.  If you requested that your care partner not be given the details of your procedure findings, then the procedure report has been included in a sealed envelope for you to review at your convenience later.  YOU SHOULD EXPECT: Some feelings of bloating in the abdomen. Passage of more gas than usual.  Walking can help get rid of the air that was put into your GI tract during the procedure and reduce the bloating. If you had a lower endoscopy (such as a colonoscopy or flexible sigmoidoscopy) you may notice spotting of blood in your stool or on the toilet paper. If you underwent a bowel prep for your procedure, you may not have a normal bowel movement for a few days.  Please Note:  You might notice some irritation and congestion in your nose or some drainage.  This is from the oxygen used during your procedure.  There is no need for concern and it should clear up in a day or so.  SYMPTOMS TO REPORT IMMEDIATELY:    Following upper endoscopy (EGD)  Vomiting of blood or coffee ground material  New chest pain or pain under the shoulder blades  Painful or persistently difficult swallowing  New shortness of breath  Fever of 100F or higher  Black, tarry-looking stools  For urgent or emergent issues, a gastroenterologist can be reached at any hour by calling [phone].   DIET:  We do recommend a small meal at first, but then you may proceed to your regular diet.  Drink plenty of fluids but you should avoid alcoholic beverages for 24 hours.  MEDICATIONS: Continue present medications. Continue PPI twice daily for 2 more months to allow healing of the ulcer even further. Then you may decrease to once daily  dosing for another 2 months.  Follow up: Repeat EGD in 2 months to check healing.  Please see handouts given to you by your recovery nurse.  ACTIVITY:  You should plan to take it easy for the rest of today and you should NOT DRIVE or use heavy machinery until tomorrow (because of the sedation medicines used during the test).    FOLLOW UP: Our staff will call the number listed on your records the next business day following your procedure to check on you and address any questions or concerns that you may have regarding the information given to you following your procedure. If we do not reach you, we will leave a message.  However, if you are feeling well and you are not experiencing any problems, there is no need to return our call.  We will assume that you have returned to your regular daily activities without incident.  If any biopsies were taken you will be contacted by phone or by letter within the next 1-3 weeks.  Please call us at [phone] if you have not heard about the biopsies in 3 weeks.   Thank you for allowing [place] to provide for your healthcare needs today. SIGNATURES/CONFIDENTIALITY: You and/or your care partner have signed paperwork which will be entered into your electronic medical record.  These signatures attest to the fact that that the information above on your After Visit Summary has been reviewed and is understood.  Full responsibility of the confidentiality of this discharge information lies with you and/or your care-partner.

## 2018-02-24 ENCOUNTER — Telehealth: Payer: Self-pay | Admitting: *Deleted

## 2018-02-24 NOTE — Telephone Encounter (Signed)
Second attempted follow up call.  Left message with phone number, [phone], for any problems or questions.

## 2018-02-24 NOTE — Telephone Encounter (Signed)
Left message on f/u call 

## 2018-02-25 ENCOUNTER — Telehealth: Payer: Self-pay

## 2018-02-25 ENCOUNTER — Encounter: Payer: Self-pay | Admitting: Gastroenterology

## 2018-02-25 NOTE — Telephone Encounter (Signed)
Attempted to reach pt via phone- unable to leave message-please inform pt of pre visit appt set for 04/09/18 @ 10:00 am (arrival time of 09:45 am)- pre visit letter to sent to pt; pt also set up for EGD on 04/20/18 arrival at 08:30 am and procedure time of 09:30 am;

## 2018-02-25 NOTE — Progress Notes (Signed)
Pre visit scheduled for 04/09/18 to arrive at 09:45 am and appt at 10:00 am ; EGD scheduled for 04/20/18 in [place] by Dr. Rush Landmark at 09:30 am

## 2018-02-26 ENCOUNTER — Telehealth: Payer: Self-pay | Admitting: Gastroenterology

## 2018-02-26 ENCOUNTER — Ambulatory Visit
Admission: RE | Admit: 2018-02-26 | Discharge: 2018-02-26 | Disposition: A | Discharge status: Home / Self Care | Payer: Medicare Other | Source: Ambulatory Visit | Attending: Internal Medicine | Admitting: Internal Medicine

## 2018-02-26 DIAGNOSIS — Z1231 Encounter for screening mammogram for malignant neoplasm of breast: Secondary | ICD-10-CM | POA: Diagnosis not present

## 2018-02-26 HISTORY — DX: Personal history of irradiation: Z92.3

## 2018-03-01 NOTE — Telephone Encounter (Signed)
Pt cancel her appt for procedure for now, would like to have the procedure on January.

## 2018-03-02 ENCOUNTER — Encounter: Payer: Self-pay | Admitting: Family

## 2018-03-02 ENCOUNTER — Ambulatory Visit (INDEPENDENT_AMBULATORY_CARE_PROVIDER_SITE_OTHER): Payer: Medicare Other | Admitting: Family

## 2018-03-02 VITALS — BP 136/62 | HR 80 | Temp 97.9°F | Ht 65.0 in | Wt 157.0 lb

## 2018-03-02 DIAGNOSIS — B9789 Other viral agents as the cause of diseases classified elsewhere: Secondary | ICD-10-CM | POA: Diagnosis not present

## 2018-03-02 DIAGNOSIS — J069 Acute upper respiratory infection, unspecified: Secondary | ICD-10-CM

## 2018-03-02 MED ORDER — FLUTICASONE PROPIONATE 50 MCG/ACT NA SUSP: 2.0000 | Freq: Every day | NASAL | 6 refills | Status: DC

## 2018-03-02 MED ORDER — HYDROCODONE-HOMATROPINE 5-1.5 MG/5ML PO SYRP: 5.0000 mL | ORAL_SOLUTION | Freq: Three times a day (TID) | ORAL | 0 refills | Status: DC | PRN

## 2018-03-02 NOTE — Telephone Encounter (Signed)
Please see additional result note

## 2018-03-02 NOTE — Telephone Encounter (Signed)
Spoke with pt- pt verbalized that she is unable to keep previously scheduled appt for pre visit and EGD that was scheduled; pt verbalized she "did not want to have to come to another pre visit because I have already been to enough of those"; advised pt it is medically necessary to make and keep a pre visit and EGD appt as it was the MD recommended plan of a 2 month f/u EGD to assess healing;  Pt advised to call back after Nov. 29th to reschedule the previsit and and EGD; pt verbalized understanding;

## 2018-03-02 NOTE — Progress Notes (Signed)
[name] is a 84 y.o. female with the following history as recorded in EpicCare:  Patient Active Problem List   Diagnosis Date Noted  . PUD (peptic ulcer disease) 12/21/2017  . Acute blood loss anemia 12/21/2017  . Hypertension 12/21/2017  . Syncope 12/14/2017  . Macrocytic anemia 12/14/2017  . Near syncope 12/14/2017  . Left sided numbness 10/19/2017  . Small vessel disease, cerebrovascular 10/19/2017  . Numbness and tingling of left arm and leg 10/13/2017  . Anxiety 10/02/2017  . Right ear pain 09/04/2017  . Prediabetes 09/29/2016  . Lung nodule < 6cm on CT 09/29/2016  . Leg cramps 01/29/2016  . Insomnia 09/12/2015  . Rectal bleed 09/12/2015  . S/P AVR 11/16/2014  . Branch retinal vein occlusion 10/12/2013  . Aortic valve stenosis, severe   . ALLERGIC RHINITIS DUE TO OTHER ALLERGEN 09/10/2009  . Hyperlipidemia 11/30/2006  . HYPOGLYCEMIA NEC 11/20/2006  . MITRAL VALVE PROLAPSE 10/29/2006  . Osteopenia 10/29/2006  . History of right breast cancer 10/29/2006    Current Outpatient Medications  Medication Sig Dispense Refill  . acetaminophen (TYLENOL) 500 MG tablet Take 500-1,000 mg by mouth at bedtime as needed for mild pain or moderate pain.    Marland Kitchen ALPRAZolam (XANAX) 0.5 MG tablet Take 1 tablet (0.5 mg total) by mouth daily as needed for anxiety. Do not take at night 90 tablet 0  . Calcium Carb-Cholecalciferol (CALCIUM 600+D3) 600-400 MG-UNIT TABS Take 1 tablet by mouth daily.    . Chloral Hydrate CRYS Take 10 mLs by mouth at bedtime as needed (Call  liquid into [place]). 900 g 1  . Cholecalciferol (VITAMIN D-3) 1000 UNITS CAPS Take 1 capsule by mouth daily.     . clindamycin (CLEOCIN) 300 MG capsule Take 600 mg by mouth as directed. Take 600 mg (2 capsules) by mouth one hour prior to dental procedures    . Coenzyme Q10 (CO Q 10 PO) Take 1 capsule by mouth daily.    . fluticasone (FLONASE) 50 MCG/ACT nasal spray Place 2 sprays into both nostrils  daily. 16 g 6  . HYDROcodone-homatropine (HYCODAN) 5-1.5 MG/5ML syrup Take 5 mLs by mouth every 8 (eight) hours as needed for cough. 100 mL 0  . Magnesium 400 MG CAPS Take 400 mg by mouth every morning.     Marland Kitchen Ophthalmic Irrigation Solution (OCUSOFT EYE [place] OP) Apply 1 application to eye 2 (two) times daily as needed (dry eyes). Morning and before bed    . pantoprazole (PROTONIX) 40 MG tablet Take 1 tablet (40 mg total) by mouth 2 (two) times daily. 60 tablet 3  . Polyethyl Glycol-Propyl Glycol (SYSTANE ULTRA PF) 0.4-0.3 % SOLN Apply 1 drop to eye 2 (two) times daily.    . rosuvastatin (CRESTOR) 10 MG tablet Take 1 tablet (10 mg total) by mouth daily. 90 tablet 1  . Wheat Dextrin (BENEFIBER DRINK MIX PO) Take 5 mLs by mouth every morning. Mix with 1/2 a glass water, and takes with vitamins     Current Facility-Administered Medications  Medication Dose Route Frequency Provider Last Rate Last Dose  . 0.9 %  sodium chloride infusion  500 mL Intravenous Once Mansouraty, Telford Nab., MD        Allergies: Amoxicillin-pot clavulanate; Penicillin g; Penicillins; Sulfa antibiotics; Sulfamethoxazole; and Sulfonamide derivatives  Past Medical History:  Diagnosis Date  . Aortic valve stenosis, severe   . Breast cancer ([place]) 2007   right  . Hiatal hernia   . Hypercholesteremia   .  Hypoglycemia   . Osteopenia   . Personal history of radiation therapy   . Tingling   . Ulcer 2012   bleeding gastric Ulcer    Past Surgical History:  Procedure Laterality Date  . AORTIC VALVE REPLACEMENT N/A 11/16/2014   Procedure: AORTIC VALVE REPLACEMENT (AVR);  Surgeon: Gaye Pollack, MD;  Location: [facility];  Service: Open Heart Surgery;  Laterality: N/A;  . BIOPSY  12/15/2017   Procedure: BIOPSY;  Surgeon: Rush Landmark Telford Nab., MD;  Location: [facility];  Service: Gastroenterology;;  . BREAST BIOPSY    . BREAST LUMPECTOMY Right    2005?  Marland Kitchen BREAST SURGERY    . CARDIAC CATHETERIZATION N/A 10/18/2014    Procedure: Right/Left Heart Cath and Coronary Angiography;  Surgeon: [name], MD;  Location: [facility];  Service: Cardiovascular;  Laterality: N/A;  . COSMETIC SURGERY     face  . ESOPHAGOGASTRODUODENOSCOPY (EGD) WITH PROPOFOL N/A 12/15/2017   Procedure: ESOPHAGOGASTRODUODENOSCOPY (EGD) WITH PROPOFOL;  Surgeon: Rush Landmark Telford Nab., MD;  Location: [facility];  Service: Gastroenterology;  Laterality: N/A;  . PARATHYROIDECTOMY    . TEE WITHOUT CARDIOVERSION N/A 11/16/2014   Procedure: TRANSESOPHAGEAL ECHOCARDIOGRAM (TEE);  Surgeon: Gaye Pollack, MD;  Location: [facility];  Service: Open Heart Surgery;  Laterality: N/A;  . TONSILLECTOMY      Family History  Problem Relation Age of Onset  . Hypertension Mother   . Rectal cancer Mother   . Colon cancer Mother   . Kidney disease Father   . Esophageal cancer Neg Hx   . Stomach cancer Neg Hx     Social History   Tobacco Use  . Smoking status: Former Smoker    Packs/day: 0.50    Years: 30.00    Pack years: 15.00    Types: Cigarettes  . Smokeless tobacco: Never Used  . Tobacco comment: quit smoking 1980  Substance Use Topics  . Alcohol use: Yes    Alcohol/week: 7.0 standard drinks    Types: 7 Glasses of wine per week    Comment: wine nightly with dinner    Subjective:  Cold symptoms x 2 day; dry cough started on Sunday; nasal drainage started today; no relief with OTC medications- has taken Allegra and Delsym; no fever; no chest pain, no shortness of breath; requesting cough syrup with Hydrocodone- normally gets Hycodan; not sleeping well at night;    Objective:  Vitals:   03/02/18 1345  BP: 136/62  Pulse: 80  Temp: 97.9 F (36.6 C)  TempSrc: Oral  SpO2: 94%  Weight: 157 lb (71.2 kg)  Height: 5\' 5"  (1.651 m)    General: Well developed, well nourished, in no acute distress  Skin : Warm and dry.  Head: Normocephalic and atraumatic  Eyes: Sclera and conjunctiva clear; pupils round and reactive to light;  extraocular movements intact  Ears: External normal; canals clear; tympanic membranes normal  Oropharynx: Pink, supple. No suspicious lesions  Neck: Supple without thyromegaly, adenopathy  Lungs: Respirations unlabored; clear to auscultation bilaterally without wheeze, rales, rhonchi  CVS exam: normal rate and regular rhythm.  Neurologic: Alert and oriented; speech intact; face symmetrical; moves all extremities well; CNII-XII intact without focal deficit   Assessment:  1. Viral URI with cough     Plan:  Reassurance; do not feel antibiotic is warranted; Rx for Flonase and Hycodan; increase fluids, rest and follow-up worse, no better.   No follow-ups on file.  No orders of the defined types were placed in this encounter.  Requested Prescriptions   Signed Prescriptions Disp Refills  . fluticasone (FLONASE) 50 MCG/ACT nasal spray 16 g 6    Sig: Place 2 sprays into both nostrils daily.  Marland Kitchen HYDROcodone-homatropine (HYCODAN) 5-1.5 MG/5ML syrup 100 mL 0    Sig: Take 5 mLs by mouth every 8 (eight) hours as needed for cough.

## 2018-03-04 ENCOUNTER — Encounter: Payer: Self-pay | Admitting: Gastroenterology

## 2018-03-04 ENCOUNTER — Encounter: Payer: Self-pay | Admitting: Sports Medicine

## 2018-03-04 ENCOUNTER — Telehealth: Payer: Self-pay | Admitting: *Deleted

## 2018-03-04 ENCOUNTER — Ambulatory Visit (INDEPENDENT_AMBULATORY_CARE_PROVIDER_SITE_OTHER): Payer: Medicare Other | Admitting: Sports Medicine

## 2018-03-04 DIAGNOSIS — M2041 Other hammer toe(s) (acquired), right foot: Secondary | ICD-10-CM | POA: Insufficient documentation

## 2018-03-04 DIAGNOSIS — L84 Corns and callosities: Secondary | ICD-10-CM

## 2018-03-04 DIAGNOSIS — M2042 Other hammer toe(s) (acquired), left foot: Secondary | ICD-10-CM | POA: Diagnosis not present

## 2018-03-04 NOTE — Assessment & Plan Note (Signed)
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 Metatarsal pads placed in shoes today. Given green insoles with metatarsal pads and arch support. Advised patient to avoid barefoot and on restarting exercise regimen.

## 2018-03-04 NOTE — Assessment & Plan Note (Signed)
Try to lessen with MT padding  Avoid ulcers on dorsum of PIP

## 2018-03-04 NOTE — Progress Notes (Signed)
   HPI  CC: L foot pain  Patient states that she has had long standing L foot pain for years. It has acutely worsened recently. It is between her 2nd and 3rd toes at the ball of her left foot. It onlyt hurts at certain times if she wears heels. She does not wear feels frequently/. It does not hurt  her when wearing sneakers. Does not radiate. No tingling. No swelling, weakness, redness.   Medications/Interventions Tried: none  See HPI and/or previous note for associated ROS.  Objective: BP 137/64   Ht 5\' 5"  (1.651 m)   Wt 151 lb (68.5 kg)   BMI 25.13 kg/m  Gen: NAD, well groomed, a/o x3, normal affect.  CV: Well-perfused. Warm.  Resp: Non-labored.  MSK: L plantar foot with significantly thickened callus between 2nd and 3rd metatarsal heads, slightly TTP. Hammer toes noted on both feet. L foot 2nd and 3rd toes with webbing and are longer than 1st toe. Short first MT on left. Callus consistent with Morton's callus. Loss of long arch bilaterally. Loss of transverse arch with widening of forefoot. Splaying between toes 2 and 3 on RT  Sensation intact throughout. No gross coordination deficits.    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 Metatarsal pads placed in shoes today. Given green insoles with metatarsal pads and arch support. Advised patient to avoid barefoot and on restarting exercise regimen.  Pads added to dress shoes as well  If this type of padding works well we will add MT pads to her other shoes and also consider custom orthotics if needed for pain relief.  Bufford Lope, DO PGY-3, [facility] 03/04/2018 10:07 AM  I observed and examined the patient with the resident and agree with assessment and plan.  Note reviewed and modified by me. Stefanie Libel, MD

## 2018-03-04 NOTE — Telephone Encounter (Signed)
Attempted to call patient to schedule repeat endoscopy as advised by Dr. Rush Landmark at 02/23/18 visit. No answer. Pathology letter mailed.

## 2018-03-05 ENCOUNTER — Telehealth: Payer: Self-pay | Admitting: Gastroenterology

## 2018-03-05 NOTE — Telephone Encounter (Signed)
The small intestine biopsies were normal. The gastric biopsies showed evidence of mild reactive changes but no precancerous changes.  I recommend the follow up EGD in 2-2.5 months as we discussed and continuation of your acid reducing medications in the interim.

## 2018-03-05 NOTE — Telephone Encounter (Signed)
The results were discussed with the pt and The pt has been advised of the information and verbalized understanding.

## 2018-03-08 NOTE — Telephone Encounter (Signed)
Spoke to patient about rescheduling her EGD. She is out of town the last two weeks of December and January schedule is not out yet. She is coming as a care partner on December 3rd for her husbands procedure and will ask to schedule her previsit and repeat EGD then.

## 2018-03-09 ENCOUNTER — Other Ambulatory Visit: Payer: Self-pay | Admitting: Internal Medicine

## 2018-03-09 NOTE — Telephone Encounter (Signed)
Last refill was 02/03/18 Last OV was 12/21/17 Next OV is 06/24/18

## 2018-03-18 ENCOUNTER — Other Ambulatory Visit: Payer: Self-pay | Admitting: Family

## 2018-03-18 MED ORDER — HYDROCODONE-HOMATROPINE 5-1.5 MG/5ML PO SYRP: 5.0000 mL | ORAL_SOLUTION | Freq: Three times a day (TID) | ORAL | 0 refills | Status: DC | PRN

## 2018-03-18 NOTE — Telephone Encounter (Signed)
I do not know if this should go to you or Mickel Baas since she last saw her for this issue 2 weeks ago.

## 2018-03-23 ENCOUNTER — Encounter: Payer: Self-pay | Admitting: Gastroenterology

## 2018-04-06 ENCOUNTER — Encounter: Payer: Self-pay | Admitting: Internal Medicine

## 2018-04-07 MED ORDER — ALPRAZOLAM 0.5 MG PO TABS: ORAL_TABLET | ORAL | 0 refills | Status: DC

## 2018-04-07 NOTE — Telephone Encounter (Signed)
[place] Controlled Substance Database checked. Last filled on 03/11/18  Last non acute OV 10/02/17 Next OV 06/24/18

## 2018-04-08 ENCOUNTER — Ambulatory Visit: Payer: Medicare Other | Admitting: Sports Medicine

## 2018-04-20 ENCOUNTER — Encounter: Payer: Medicare Other | Admitting: Gastroenterology

## 2018-04-30 ENCOUNTER — Ambulatory Visit (AMBULATORY_SURGERY_CENTER): Payer: Self-pay

## 2018-04-30 ENCOUNTER — Other Ambulatory Visit: Payer: Self-pay

## 2018-04-30 VITALS — Ht 65.0 in | Wt 160.6 lb

## 2018-04-30 DIAGNOSIS — K253 Acute gastric ulcer without hemorrhage or perforation: Secondary | ICD-10-CM

## 2018-04-30 NOTE — Progress Notes (Signed)
No egg or soy allergy known to patient  No issues with past sedation with any surgeries  or procedures, no intubation problems  No diet pills per patient No home 02 use per patient  No blood thinners per patient  Pt denies issues with constipation  No A fib or A flutter  EMMI video sent to pt's e mail , pt declined    

## 2018-05-04 ENCOUNTER — Ambulatory Visit: Payer: Medicare Other | Admitting: Sports Medicine

## 2018-05-07 ENCOUNTER — Other Ambulatory Visit: Payer: Self-pay | Admitting: Internal Medicine

## 2018-05-07 NOTE — Telephone Encounter (Signed)
[place] Controlled Substance Database checked. Last filled on 04/07/18  Last OV 10/02/17

## 2018-05-10 ENCOUNTER — Telehealth: Payer: Self-pay | Admitting: Gastroenterology

## 2018-05-10 ENCOUNTER — Other Ambulatory Visit: Payer: Self-pay | Admitting: Gastroenterology

## 2018-05-10 MED ORDER — PANTOPRAZOLE SODIUM 40 MG PO TBEC: 40.0000 mg | DELAYED_RELEASE_TABLET | Freq: Every day | ORAL | 2 refills | Status: DC

## 2018-05-10 NOTE — Telephone Encounter (Signed)
Spoke to patient. Informed her that pantoprazole has been sent to [place]. Patient may start on pantoprazole 40mg  once daily per Dr Mansouraty;s 10/19 procedure note.

## 2018-05-10 NOTE — Telephone Encounter (Signed)
Patient states she needs a refill of medication pantoprazole sent to [facility].

## 2018-05-13 ENCOUNTER — Ambulatory Visit (AMBULATORY_SURGERY_CENTER): Payer: Medicare Other | Admitting: Gastroenterology

## 2018-05-13 ENCOUNTER — Other Ambulatory Visit: Payer: Self-pay | Admitting: Gastroenterology

## 2018-05-13 ENCOUNTER — Telehealth: Payer: Self-pay | Admitting: Gastroenterology

## 2018-05-13 ENCOUNTER — Encounter: Payer: Self-pay | Admitting: Gastroenterology

## 2018-05-13 ENCOUNTER — Other Ambulatory Visit: Payer: Medicare Other

## 2018-05-13 VITALS — BP 155/79 | HR 52 | Temp 97.3°F | Resp 12 | Ht 65.0 in | Wt 160.0 lb

## 2018-05-13 DIAGNOSIS — K253 Acute gastric ulcer without hemorrhage or perforation: Secondary | ICD-10-CM

## 2018-05-13 DIAGNOSIS — K2970 Gastritis, unspecified, without bleeding: Secondary | ICD-10-CM

## 2018-05-13 DIAGNOSIS — K56699 Other intestinal obstruction unspecified as to partial versus complete obstruction: Secondary | ICD-10-CM | POA: Diagnosis not present

## 2018-05-13 DIAGNOSIS — K2950 Unspecified chronic gastritis without bleeding: Secondary | ICD-10-CM | POA: Diagnosis not present

## 2018-05-13 DIAGNOSIS — Z0000 Encounter for general adult medical examination without abnormal findings: Secondary | ICD-10-CM

## 2018-05-13 DIAGNOSIS — K259 Gastric ulcer, unspecified as acute or chronic, without hemorrhage or perforation: Secondary | ICD-10-CM | POA: Diagnosis not present

## 2018-05-13 DIAGNOSIS — K449 Diaphragmatic hernia without obstruction or gangrene: Secondary | ICD-10-CM | POA: Diagnosis not present

## 2018-05-13 LAB — BUN/CREATININE RATIO
BUN: 19 mg/dL (ref 7–25)
Creat: 0.75 mg/dL (ref 0.60–0.88)
GFR, EST AFRICAN AMERICAN: 85 mL/min/{1.73_m2} (ref >=60)
GFR, Est Non African American: 74 mL/min/{1.73_m2} (ref >=60)

## 2018-05-13 MED ORDER — SUCRALFATE 1 G PO TABS: 1.0000 g | ORAL_TABLET | Freq: Three times a day (TID) | ORAL | 4 refills | Status: DC

## 2018-05-13 MED ORDER — SODIUM CHLORIDE 0.9 % IV SOLN: 500.0000 mL | Freq: Once | INTRAVENOUS | Status: DC

## 2018-05-13 NOTE — Progress Notes (Signed)
To PACU, VSS. Report to [email or web address] 

## 2018-05-13 NOTE — Telephone Encounter (Signed)
Returned patient's call requesting instructions on how to take her Carafate and diet instructions. I went over Dr. Donneta Romberg procedure report with her, confirmed that she is to take her Carafate at 1 hour prior to or 1 hour after her other medications and helped her work out a medication schedule that works best for her. I explained to her which medications are NSAIDs and reiterated that she is to avoid taking those medications and reminded her of the importance of taking her PPI twice daily as instructed by Dr. Rush Landmark. I went over post procedure diet instructions. I offered Gastritis and GERD handouts for her and patient said she would like me to mail those to her which I have done prior to leaving for the day. Someone from Dr. Donneta Romberg office will call her tomorrow to schedule her CT scan. Patient verbalizes understanding of all of the above and has no further questions at this time.

## 2018-05-13 NOTE — Telephone Encounter (Signed)
Thank you for doing this. Patty or Pamela Billings, can you reach out to patient tomorrow and help with arranging the CT scan and also ensure she is taking the Carafate the right way. Thanks. GM

## 2018-05-13 NOTE — Patient Instructions (Signed)
No NSAIDS.  No aspirin, ibuprofen, naproxen, etc Begin Sucralfate 1 gm three times daily.  No  other meds 1 hour before or 1 hour after any other medicines. Will obtain CT abdomen with IV/PO contrast. Take Protonix twice daily.    YOU HAD AN ENDOSCOPIC PROCEDURE TODAY AT [facility]:   Refer to the procedure report that was given to you for any specific questions about what was found during the examination.  If the procedure report does not answer your questions, please call your gastroenterologist to clarify.  If you requested that your care partner not be given the details of your procedure findings, then the procedure report has been included in a sealed envelope for you to review at your convenience later.  YOU SHOULD EXPECT: Some feelings of bloating in the abdomen. Passage of more gas than usual.  Walking can help get rid of the air that was put into your GI tract during the procedure and reduce the bloating. If you had a lower endoscopy (such as a colonoscopy or flexible sigmoidoscopy) you may notice spotting of blood in your stool or on the toilet paper. If you underwent a bowel prep for your procedure, you may not have a normal bowel movement for a few days.  Please Note:  You might notice some irritation and congestion in your nose or some drainage.  This is from the oxygen used during your procedure.  There is no need for concern and it should clear up in a day or so.  SYMPTOMS TO REPORT IMMEDIATELY:   Following upper endoscopy (EGD)  Vomiting of blood or coffee ground material  New chest pain or pain under the shoulder blades  Painful or persistently difficult swallowing  New shortness of breath  Fever of 100F or higher  Black, tarry-looking stools  For urgent or emergent issues, a gastroenterologist can be reached at any hour by calling [phone].   DIET:  We do recommend a small meal at first, but then you may proceed to your regular diet.  Drink plenty  of fluids but you should avoid alcoholic beverages for 24 hours.  ACTIVITY:  You should plan to take it easy for the rest of today and you should NOT DRIVE or use heavy machinery until tomorrow (because of the sedation medicines used during the test).    FOLLOW UP: Our staff will call the number listed on your records the next business day following your procedure to check on you and address any questions or concerns that you may have regarding the information given to you following your procedure. If we do not reach you, we will leave a message.  However, if you are feeling well and you are not experiencing any problems, there is no need to return our call.  We will assume that you have returned to your regular daily activities without incident.  If any biopsies were taken you will be contacted by phone or by letter within the next 1-3 weeks.  Please call us at [phone] if you have not heard about the biopsies in 3 weeks.    SIGNATURES/CONFIDENTIALITY: You and/or your care partner have signed paperwork which will be entered into your electronic medical record.  These signatures attest to the fact that that the information above on your After Visit Summary has been reviewed and is understood.  Full responsibility of the confidentiality of this discharge information lies with you and/or your care-partner.

## 2018-05-13 NOTE — Op Note (Signed)
[place] Patient Name: [name] Procedure Date: 05/13/2018 8:58 AM MRN: [id number] Endoscopist: Justice Britain , MD Age: 85 Referring MD:  Date of Birth: Feb 17, 1933 Gender: Female Account #: [id number] Procedure:                Upper GI endoscopy Indications:              Chronic gastric ulcer, Follow-up of chronic gastric                            ulcer Medicines:                Monitored Anesthesia Care Procedure:                Pre-Anesthesia Assessment:                           - Prior to the procedure, a History and Physical                            was performed, and patient medications and                            allergies were reviewed. The patient's tolerance of                            previous anesthesia was also reviewed. The risks                            and benefits of the procedure and the sedation                            options and risks were discussed with the patient.                            All questions were answered, and informed consent                            was obtained. Prior Anticoagulants: The patient has                            taken no previous anticoagulant or antiplatelet                            agents. ASA Grade Assessment: II - A patient with                            mild systemic disease. After reviewing the risks                            and benefits, the patient was deemed in                            satisfactory condition to undergo the procedure.  After obtaining informed consent, the endoscope was                            passed under direct vision. Throughout the                            procedure, the patient's blood pressure, pulse, and                            oxygen saturations were monitored continuously. The                            Model GIF-HQ190 [id number]) scope was introduced                            through the mouth, and advanced to the  second part                            of duodenum. The upper GI endoscopy was                            accomplished without difficulty. The patient                            tolerated the procedure. Scope In: Scope Out: Findings:                 No gross lesions were noted in the proximal                            esophagus, in the mid esophagus and in the distal                            esophagus.                           A small hiatal hernia was found. The proximal                            extent of the gastric folds (end of tubular                            esophagus) was 36 cm from the incisors. The hiatal                            narrowing was 38 cm from the incisors. The Z-line                            was 36 cm from the incisors.                           Localized moderate mucosal changes characterized by                            erythema,  granularity and altered texture were                            found on the greater curvature of the stomach.                            Biopsies were taken with a cold forceps for                            histology.                           One non-bleeding superficial gastric ulcer with a                            clean ulcer base (Forrest Class III) was found in                            the gastric antrum. The lesion was 12 mm in largest                            dimension. Biopsies were taken with a cold forceps                            for histology.                           Normal mucosa was found in the entire examined                            stomach otherwise, but because of the changes and                            the persistent ulcer, decision was made to rule out                            intestinal metaplasia or other etiologies of                            atrophic gastritis via histology. Biopsies were                            taken with a cold forceps for histology and HP                             evaluation from the cardia. Biopsies were taken                            with a cold forceps for histology and HP evaluation                            from the fundus. Biopsies were taken with a cold  forceps for histology and HP evaluation from the                            lesser curve. Biopsies were taken with a cold                            forceps for histology and HP evaluation from the                            incisura and antrum.                           No gross lesions were noted in the duodenal bulb,                            in the first portion of the duodenum and in the                            second portion of the duodenum. Complications:            No immediate complications. Estimated Blood Loss:     Estimated blood loss was minimal. Impression:               - No gross lesions in esophagus. Small hiatal                            hernia.                           - Erythematous, granular and texture changed mucosa                            in the greater curvature. Biopsied.                           - Non-bleeding gastric ulcer with a clean ulcer                            base (Forrest Class III). This is still present                            from prior examination. Biopsied.                           - Normal mucosa was found in the entire stomach                            otherwise Gastric mapping biopsies performed to                            rule out intestinal metaplasia or atrophic                            gastritis.                           -  No gross lesions in the duodenal bulb, in the                            first portion of the duodenum and in the second                            portion of the duodenum. Recommendation:           - The patient will be observed post-procedure,                            until all discharge criteria are met.                           - Discharge patient to home.                            - Patient has a contact number available for                            emergencies. The signs and symptoms of potential                            delayed complications were discussed with the                            patient. Return to normal activities tomorrow.                            Written discharge instructions were provided to the                            patient.                           - Resume previous diet.                           - No aspirin, ibuprofen, naproxen, or other                            non-steroidal anti-inflammatory drugs.                           - Await pathology results.                           - Continue PPI twice daily.                           - Will begin Sucralfate 1 g TID (must take 1 hour                            before any medications or 1 hours after any  medications in order to decrease risk of                            malabsorption of other medications).                           - Will obtain a CT-Abdomen with IV/PO contrast to                            evaluate if any other lesion or region is present                            that is causing the ulcer to non-heal.                           - The findings and recommendations were discussed                            with the patient.                           - The findings and recommendations were discussed                            with the patient's family. Justice Britain, MD 05/13/2018 9:34:53 AM

## 2018-05-13 NOTE — Progress Notes (Signed)
Called to room to assist during endoscopic procedure.  Patient ID and intended procedure confirmed with present staff. Received instructions for my participation in the procedure from the performing physician.  

## 2018-05-14 ENCOUNTER — Telehealth: Payer: Self-pay

## 2018-05-14 ENCOUNTER — Telehealth: Payer: Self-pay | Admitting: Gastroenterology

## 2018-05-14 ENCOUNTER — Telehealth: Payer: Self-pay | Admitting: *Deleted

## 2018-05-14 NOTE — Telephone Encounter (Signed)
Left message on f/u call 

## 2018-05-14 NOTE — Telephone Encounter (Signed)
Pt called in wanting to schd her ct scan that was ordered by the doctor

## 2018-05-14 NOTE — Telephone Encounter (Signed)
Left message for patient to call back  

## 2018-05-14 NOTE — Telephone Encounter (Signed)
Will call pt and schedule CT when reviewing reports.

## 2018-05-14 NOTE — Telephone Encounter (Signed)
I called patient back,and spoke to her husband who stated patient left the house and would not be back for about 10 minutes. I told him that I will call her Monday morning to schedule the CT.

## 2018-05-14 NOTE — Telephone Encounter (Signed)
Pt returned your call to sch ct scan, she stated to please call her at her cell [phone] and she will be waiting for your call this afternoon.

## 2018-05-17 ENCOUNTER — Telehealth: Payer: Self-pay

## 2018-05-17 ENCOUNTER — Other Ambulatory Visit: Payer: Self-pay | Admitting: Gastroenterology

## 2018-05-17 DIAGNOSIS — K253 Acute gastric ulcer without hemorrhage or perforation: Secondary | ICD-10-CM

## 2018-05-17 NOTE — Telephone Encounter (Signed)
Thank you for update. Let's plan to decrease the Fiber to 1 teaspoon daily. Would add one dose of Miralax daily. If still having issues, we can decrease Carafate to BID dosing, but would start with above. GM

## 2018-05-17 NOTE — Telephone Encounter (Signed)
Per procedure report CT abd pelvis IV and oral contrast.  Determine if any other lesion or region is present to prevent gastric ulcer healing.

## 2018-05-17 NOTE — Telephone Encounter (Signed)
The pt was schedule for 1/24 for CT scan per Grace Bushy.  Pt notified

## 2018-05-17 NOTE — Telephone Encounter (Signed)
Dr Rush Landmark, Patient has been scheduled for her CT on 05/21/18. Instructions have been mailed to her. She wanted you to know that since starting Carafate last week,she has become very constipated. She is taking the medication TID 1 hour after her other medications,also she states it is very difficult to swallow.  She is taking 2 teaspoons of Benefiber daily. She would like to know if you would suggest anything to ease her constipation. Please advise.  Thanks, [employer]

## 2018-05-17 NOTE — Telephone Encounter (Signed)
Spoke to patient. Informed her of Dr Mansouraty's suggestions to help with  Her constipation. Patient voiced understanding and will call as needed.

## 2018-05-18 ENCOUNTER — Encounter: Payer: Self-pay | Admitting: Gastroenterology

## 2018-05-18 DIAGNOSIS — H2513 Age-related nuclear cataract, bilateral: Secondary | ICD-10-CM | POA: Diagnosis not present

## 2018-05-18 DIAGNOSIS — H34831 Tributary (branch) retinal vein occlusion, right eye, with macular edema: Secondary | ICD-10-CM | POA: Diagnosis not present

## 2018-05-18 DIAGNOSIS — H25043 Posterior subcapsular polar age-related cataract, bilateral: Secondary | ICD-10-CM | POA: Diagnosis not present

## 2018-05-18 DIAGNOSIS — H35373 Puckering of macula, bilateral: Secondary | ICD-10-CM | POA: Diagnosis not present

## 2018-05-18 DIAGNOSIS — H25013 Cortical age-related cataract, bilateral: Secondary | ICD-10-CM | POA: Diagnosis not present

## 2018-05-18 DIAGNOSIS — H18413 Arcus senilis, bilateral: Secondary | ICD-10-CM | POA: Diagnosis not present

## 2018-05-18 DIAGNOSIS — H2511 Age-related nuclear cataract, right eye: Secondary | ICD-10-CM | POA: Diagnosis not present

## 2018-05-19 ENCOUNTER — Ambulatory Visit (INDEPENDENT_AMBULATORY_CARE_PROVIDER_SITE_OTHER)
Admission: RE | Admit: 2018-05-19 | Discharge: 2018-05-19 | Disposition: A | Discharge status: Home / Self Care | Payer: Medicare Other | Source: Ambulatory Visit | Attending: Gastroenterology | Admitting: Gastroenterology

## 2018-05-19 DIAGNOSIS — K253 Acute gastric ulcer without hemorrhage or perforation: Secondary | ICD-10-CM | POA: Diagnosis not present

## 2018-05-19 DIAGNOSIS — K259 Gastric ulcer, unspecified as acute or chronic, without hemorrhage or perforation: Secondary | ICD-10-CM | POA: Diagnosis not present

## 2018-05-19 MED ORDER — IOPAMIDOL (ISOVUE-300) INJECTION 61%
100.0000 mL | Freq: Once | INTRAVENOUS | Status: AC | PRN
  Administered 2018-05-19: 100 mL via INTRAVENOUS

## 2018-05-19 MED ORDER — IOPAMIDOL (ISOVUE-300) INJECTION 61%: 100.0000 mL | Freq: Once | INTRAVENOUS | Status: DC | PRN

## 2018-05-21 ENCOUNTER — Inpatient Hospital Stay: Admission: RE | Admit: 2018-05-21 | Payer: Medicare Other | Source: Ambulatory Visit

## 2018-05-25 ENCOUNTER — Other Ambulatory Visit: Payer: Self-pay | Admitting: Gastroenterology

## 2018-05-25 MED ORDER — PANTOPRAZOLE SODIUM 40 MG PO TBEC: 40.0000 mg | DELAYED_RELEASE_TABLET | Freq: Two times a day (BID) | ORAL | 6 refills | Status: DC

## 2018-05-28 DIAGNOSIS — D3141 Benign neoplasm of right ciliary body: Secondary | ICD-10-CM | POA: Diagnosis not present

## 2018-05-28 DIAGNOSIS — H35371 Puckering of macula, right eye: Secondary | ICD-10-CM | POA: Diagnosis not present

## 2018-05-28 DIAGNOSIS — H2513 Age-related nuclear cataract, bilateral: Secondary | ICD-10-CM | POA: Diagnosis not present

## 2018-05-28 DIAGNOSIS — H348312 Tributary (branch) retinal vein occlusion, right eye, stable: Secondary | ICD-10-CM | POA: Diagnosis not present

## 2018-05-28 DIAGNOSIS — H04123 Dry eye syndrome of bilateral lacrimal glands: Secondary | ICD-10-CM | POA: Diagnosis not present

## 2018-06-03 ENCOUNTER — Ambulatory Visit (INDEPENDENT_AMBULATORY_CARE_PROVIDER_SITE_OTHER): Payer: Medicare Other | Admitting: Gastroenterology

## 2018-06-03 ENCOUNTER — Encounter: Payer: Self-pay | Admitting: Gastroenterology

## 2018-06-03 VITALS — BP 130/62 | HR 60 | Ht 65.0 in | Wt 158.0 lb

## 2018-06-03 DIAGNOSIS — K5909 Other constipation: Secondary | ICD-10-CM | POA: Diagnosis not present

## 2018-06-03 DIAGNOSIS — K257 Chronic gastric ulcer without hemorrhage or perforation: Secondary | ICD-10-CM

## 2018-06-03 NOTE — Progress Notes (Addendum)
[place] VISIT   Primary Care Provider [name], MD [place] [address] [phone]  Referring Provider [name], MD [address] [place],  [address] [phone]  Patient Profile: [name] is a 85 y.o. female with a pmh significant for peptic ulcer disease (recurrent GU), prior breast cancer, hypertension scratch that hyperlipidemia, status post AVR (bioprosthetic), osteopenia, anxiety.  The patient presents to the [facility] for an evaluation and management of problem(s) noted below:  Problem List 1. Chronic gastric ulcer without hemorrhage and without perforation   2. Constipation, chronic     History of Present Illness Please see prior notation from the hospital as well as PA [place] with seeing the patient in follow-up for full details of HPI.  Interval History Patient comes in for scheduled follow-up.  The patient states that she has been taking her PPI as prescribed.  At the time of her last endoscopy where we saw a persistent gastric ulcer we decided to initiate sucralfate.  She has been taking it 2 times a day because if she tried to take it 3 times a day she would get more constipated as well as bloating and discomfort.  By doing it twice a day she has done well.  She is ensuring that she is not taking medication 1 hour before 1 hour after the sucralfate is taken.  She continues to take prune juice as a means of trying to regulate herself.  She is very anxious about repeat endoscopies.  She feels that she is always been to have an ulcer because of the stress that she deals with at home surrounding her.  She continues to report no use of any nonsteroidals or BC/Goody powders.  She does not use any tobacco products.  She does continue to drink alcohol in the setting of wine in or between 1 to 2 glasses/day but mostly 1 glass/day.  She is otherwise without any significant GI symptoms.  GI  Review of Systems Positive as above Negative for pyrosis, dysphagia, nausea, vomiting, jaundice, change in appetite, early satiety, change in bowel habits, melena, hematochezia  Review of Systems General: Denies fevers/chills/weight loss HEENT: Denies oral lesions Cardiovascular: Denies chest pain Pulmonary: Denies shortness of breath Gastroenterological: See HPI Genitourinary: Denies darkened urine Hematological: Denies easy bruising Dermatological: Denies jaundice Psychological: Mood is anxious   Medications Current Outpatient Medications  Medication Sig Dispense Refill  . acetaminophen (TYLENOL) 500 MG tablet Take 500-1,000 mg by mouth at bedtime as needed for mild pain or moderate pain.    . Calcium Carb-Cholecalciferol (CALCIUM 600+D3) 600-400 MG-UNIT TABS Take 1 tablet by mouth daily.    . Chloral Hydrate CRYS Take 10 mLs by mouth at bedtime as needed (Call  liquid into [place]). 900 g 1  . Cholecalciferol (VITAMIN D-3) 1000 UNITS CAPS Take 1 capsule by mouth daily.     . clindamycin (CLEOCIN) 300 MG capsule Take 600 mg by mouth as directed. Take 600 mg (2 capsules) by mouth one hour prior to dental procedures    . Coenzyme Q10 (CO Q 10 PO) Take 1 capsule by mouth daily.    . fluticasone (FLONASE) 50 MCG/ACT nasal spray Place 2 sprays into both nostrils daily. 16 g 6  . HYDROcodone-homatropine (HYCODAN) 5-1.5 MG/5ML syrup Take 5 mLs by mouth every 8 (eight) hours as needed for cough. 100 mL 0  . Magnesium 400 MG CAPS Take 400 mg by mouth every  morning.     Marland Kitchen Ophthalmic Irrigation Solution (OCUSOFT EYE [place] OP) Apply 1 application to eye 2 (two) times daily as needed (dry eyes). Morning and before bed    . pantoprazole (PROTONIX) 40 MG tablet Take 1 tablet (40 mg total) by mouth 2 (two) times daily. 60 tablet 6  . Polyethyl Glycol-Propyl Glycol (SYSTANE ULTRA PF) 0.4-0.3 % SOLN Apply 1 drop to eye 2 (two) times daily.    . rosuvastatin (CRESTOR) 10 MG tablet  Take 1 tablet (10 mg total) by mouth daily. 90 tablet 1  . sucralfate (CARAFATE) 1 g tablet Take 1 tablet (1 g total) by mouth 3 (three) times daily. (Patient taking differently: Take 1 g by mouth 2 (two) times daily. ) 90 tablet 4  . Wheat Dextrin (BENEFIBER DRINK MIX PO) Take 5 mLs by mouth every morning. Mix with 1/2 a glass water, and takes with vitamins    . ALPRAZolam (XANAX) 0.5 MG tablet TAKE ONE TABLET BY MOUTH DAILY AS NEEDED FOR ANXIETY *DO NOT TAKE AT NIGHT* 30 tablet 0   Current Facility-Administered Medications  Medication Dose Route Frequency Provider Last Rate Last Dose  . 0.9 %  sodium chloride infusion  500 mL Intravenous Once Mansouraty, Telford Nab., MD        Allergies Allergies  Allergen Reactions  . Amoxicillin-Pot Clavulanate Hives and Rash  . Penicillin G Rash  . Penicillins Hives and Rash  . Sulfa Antibiotics Rash  . Sulfamethoxazole Rash  . Sulfonamide Derivatives Hives    Histories Past Medical History:  Diagnosis Date  . Anxiety   . Aortic valve stenosis, severe   . Breast cancer ([place]) 2007   right  . Hiatal hernia   . Hypercholesteremia   . Hypoglycemia   . Osteopenia   . Personal history of radiation therapy   . Tingling   . Ulcer 2012   bleeding gastric Ulcer   Past Surgical History:  Procedure Laterality Date  . AORTIC VALVE REPLACEMENT N/A 11/16/2014   Procedure: AORTIC VALVE REPLACEMENT (AVR);  Surgeon: Gaye Pollack, MD;  Location: [place];  Service: Open Heart Surgery;  Laterality: N/A;  . BIOPSY  12/15/2017   Procedure: BIOPSY;  Surgeon: Rush Landmark Telford Nab., MD;  Location: [place];  Service: Gastroenterology;;  . BREAST BIOPSY    . BREAST LUMPECTOMY Right    2005?  Marland Kitchen BREAST SURGERY    . CARDIAC CATHETERIZATION N/A 10/18/2014   Procedure: Right/Left Heart Cath and Coronary Angiography;  Surgeon: [name], MD;  Location: [facility];  Service: Cardiovascular;  Laterality: N/A;  . COSMETIC SURGERY     face  .  ESOPHAGOGASTRODUODENOSCOPY (EGD) WITH PROPOFOL N/A 12/15/2017   Procedure: ESOPHAGOGASTRODUODENOSCOPY (EGD) WITH PROPOFOL;  Surgeon: Rush Landmark Telford Nab., MD;  Location: [place];  Service: Gastroenterology;  Laterality: N/A;  . PARATHYROIDECTOMY    . TEE WITHOUT CARDIOVERSION N/A 11/16/2014   Procedure: TRANSESOPHAGEAL ECHOCARDIOGRAM (TEE);  Surgeon: Gaye Pollack, MD;  Location: [place];  Service: Open Heart Surgery;  Laterality: N/A;  . TONSILLECTOMY     Social History   Socioeconomic History  . Marital status: Married    Spouse name: Not on file  . Number of children: 3  . Years of education: 16  . Highest education level: Bachelor's degree (e.g., BA, AB, BS)  Occupational History  . Occupation: Retired  Scientific laboratory technician  . Financial resource strain: Not hard at all  . Food insecurity:    Worry: Never true  Inability: Never true  . Transportation needs:    Medical: No    Non-medical: No  Tobacco Use  . Smoking status: Former Smoker    Packs/day: 0.50    Years: 30.00    Pack years: 15.00    Types: Cigarettes  . Smokeless tobacco: Never Used  . Tobacco comment: quit smoking 1980  Substance and Sexual Activity  . Alcohol use: Yes    Alcohol/week: 7.0 standard drinks    Types: 7 Glasses of wine per week    Comment: wine nightly with dinner  . Drug use: No  . Sexual activity: Not on file  Lifestyle  . Physical activity:    Days per week: 0 days    Minutes per session: 0 min  . Stress: Rather much  Relationships  . Social connections:    Talks on phone: More than three times a week    Gets together: More than three times a week    Attends religious service: Not on file    Active member of club or organization: Not on file    Attends meetings of clubs or organizations: Not on file    Relationship status: Married  . Intimate partner violence:    Fear of current or ex partner: No    Emotionally abused: No    Physically abused: No    Forced sexual activity: No    Other Topics Concern  . Not on file  Social History Narrative   Lives at [facility] with her husband.   Right-handed.   Caffeine use: 3-4 cups per day (some tea, mixes coffee with decaf).   Family History  Problem Relation Age of Onset  . Hypertension Mother   . Rectal cancer Mother   . Colon cancer Mother   . Kidney disease Father   . Esophageal cancer Neg Hx   . Stomach cancer Neg Hx    I have reviewed her medical, social, and family history in detail and updated the electronic medical record as necessary.    PHYSICAL EXAMINATION  BP 130/62   Pulse 60   Ht 5' 5"  (1.651 m)   Wt 158 lb (71.7 kg)   BMI 26.29 kg/m  Wt Readings from Last 3 Encounters:  06/03/18 158 lb (71.7 kg)  05/13/18 160 lb (72.6 kg)  04/30/18 160 lb 9.6 oz (72.8 kg)  GEN: NAD, appears stated age, doesn't appear chronically ill PSYCH: Cooperative, without pressured speech EYE: Conjunctivae pink, sclerae anicteric ENT: MMM, without oral ulcers, no erythema or exudates noted CV: RR without R/Gs  RESP: CTAB posteriorly, without wheezing GI: NABS, soft, NT/ND, without rebound or guarding, no HSM appreciated MSK/EXT: No lower extremity edema SKIN: No jaundice NEURO:  Alert & Oriented x 3, no focal deficits   REVIEW OF DATA  I reviewed the following data at the time of this encounter:  GI Procedures and Studies  January 2020 EGD - No gross lesions in esophagus. Small hiatal hernia. - Erythematous, granular and texture changed mucosa in the greater curvature. Biopsied. - Non-bleeding gastric ulcer with a clean ulcer base ([place] Class III). This is still present from prior examination. Biopsied. - Normal mucosa was found in the entire stomach otherwise Gastric mapping biopsies performed to rule out intestinal metaplasia or atrophic gastritis. - No gross lesions in the duodenal bulb, in the first portion of the duodenum and in the second portion of the duodenum.  Laboratory Studies  Reviewed in  epic  Imaging Studies  January 2020 CT abdomen  pelvis with contrast IMPRESSION: 1. Stomach is collapsed, limiting evaluation. Apparent wall thickening in the gastric antrum without discrete gastric mass, compatible with the provided history of gastric ulcer disease. 2. No free air or focal fluid collections to suggest perforation. 3. No evidence of metastatic disease in the abdomen or pelvis. 4.  Aortic Atherosclerosis (ICD10-I70.0).   ASSESSMENT  Ms. Kroenke is a 85 y.o. female  with a pmh significant for peptic ulcer disease (recurrent GU), prior breast cancer, hypertension scratch that hyperlipidemia, status post AVR (bioprosthetic), osteopenia, anxiety.  The patient is seen today for evaluation and management of:  1. Chronic gastric ulcer without hemorrhage and without perforation   2. Constipation, chronic    The patient is hemodynamically and clinically stable.  When I first met the patient back in August she presented with anemia and was found to have a gastric ulcer in the antral region.  We initiated on PPI therapy and subsequently has been doing surveillance endoscopies to check on healing.  2 endoscopies later approximately 5 months since initial endoscopy she still has a persistent ulcer.  We initiated a CT abdomen pelvis to ensure that she do not have a mass lesion encroaching or causing infiltration and that was negative.  We have not checked a gastrin level however.  I initiate her on sucralfate and she seems to be doing well with it twice daily and I would like her to continue that.  She is concerned about these continued and repeated procedures even though she has done very well with each of them.  If the patient on her next endoscopy has evidence of persistent ulcer then I think we need to check a gastrin level, see if we can check a pH at the time of endoscopy, and likely transition her medication/PPI to a different mechanism of PPI such as rabeprazole in case she i someone  that is just metabolizing her current PPI.  Our plan is to move forward with a repeat endoscopy in approximately 4 months.  All patient questions were answered, to the best of my ability, and the patient agrees to the aforementioned plan of action with follow-up as indicated.  Upon thinking about the patient further after she had left and while completing this note, I thought it would be reasonable as well for [place] to obtain a blood count as well as anemia studies to see if her numbers have improved as we have not rechecked them since August.  We will reach out to the patient early next week to talk with her about this and have those labs performed.   PLAN  Continue PPI twice daily Continue sucralfate twice daily Repeat endoscopy in 4 months (if persistent GU then try to perform a fasting gastrin level and try and obtain a pH evaluation of her gastric juices and likely transition her omeprazole to rabeprazole) We will reach out to patient to discuss ordering a CBC/iron/TIBC/ferritin/B12/folate level in the next few weeks and if she remains anemic may need to consider a colonoscopy at time of upper endoscopy   No orders of the defined types were placed in this encounter.   New Prescriptions   No medications on file   Modified Medications   Modified Medication Previous Medication   ALPRAZOLAM (XANAX) 0.5 MG TABLET ALPRAZolam (XANAX) 0.5 MG tablet      TAKE ONE TABLET BY MOUTH DAILY AS NEEDED FOR ANXIETY *DO NOT TAKE AT NIGHT*    TAKE ONE TABLET BY MOUTH DAILY AS NEEDED FOR ANXIETY *  DO NOT TAKE AT NIGHT*    Planned Follow Up: No follow-ups on file.   Justice Britain, MD [facility] Advanced [facility]oscopy Office # [phone]

## 2018-06-03 NOTE — Patient Instructions (Signed)
If you are age 83 or older, your body mass index should be between 23-30. Your Body mass index is 26.29 kg/m. If this is out of the aforementioned range listed, please consider follow up with your Primary Care Provider.  If you are age 85 or younger, your body mass index should be between 19-25. Your Body mass index is 26.29 kg/m. If this is out of the aformentioned range listed, please consider follow up with your Primary Care Provider.    We have put in a recall for late April , Early May for you to be scheduled for an Endo.   Thank you for choosing me and [facility].  Dr. Rush Landmark

## 2018-06-04 ENCOUNTER — Other Ambulatory Visit: Payer: Self-pay | Admitting: Internal Medicine

## 2018-06-04 MED ORDER — ALPRAZOLAM 0.5 MG PO TABS: ORAL_TABLET | ORAL | 0 refills | Status: DC

## 2018-06-04 NOTE — Telephone Encounter (Signed)
Check [place] registry last filled 05/08/2018.Marland KitchenJohny Chess

## 2018-06-05 DIAGNOSIS — K5909 Other constipation: Secondary | ICD-10-CM | POA: Insufficient documentation

## 2018-06-05 DIAGNOSIS — K257 Chronic gastric ulcer without hemorrhage or perforation: Secondary | ICD-10-CM | POA: Insufficient documentation

## 2018-06-07 ENCOUNTER — Telehealth: Payer: Self-pay | Admitting: *Deleted

## 2018-06-07 ENCOUNTER — Other Ambulatory Visit: Payer: Self-pay | Admitting: Internal Medicine

## 2018-06-07 NOTE — Telephone Encounter (Signed)
   [facility] Pre-operative Risk Assessment    Request for surgical clearance:  1. What type of surgery is being performed? CATARACT EXTRACTION W/INTROCULAR LENS IMPLANT OF THE RIGHT EYE FOLLOWED BY THE LEFT EYE  2. When is this surgery scheduled? 06/21/18   3. What type of clearance is required (medical clearance vs. Pharmacy clearance to hold med vs. Both)? MEDICAL  4. Are there any medications that need to be held prior to surgery and how long?PER DR. BEVIS PT DOES NOT NEED TO HOLD ANY MEDICATIONS   5. Practice name and name of physician performing surgery? [facility] DR. Christia Reading BEVIS   6. What is your office phone number [phone]    7.   What is your office fax number [phone]  8.   Anesthesia type (None, local, MAC, general) ? TOPICAL ANESTHESIA WITH IV MEDICATIONS   Julaine Hua 06/07/2018, 2:29 PM  _________________________________________________________________   (provider comments below)

## 2018-06-08 ENCOUNTER — Telehealth: Payer: Self-pay

## 2018-06-08 ENCOUNTER — Other Ambulatory Visit: Payer: Self-pay

## 2018-06-08 DIAGNOSIS — K589 Irritable bowel syndrome without diarrhea: Secondary | ICD-10-CM

## 2018-06-08 DIAGNOSIS — K253 Acute gastric ulcer without hemorrhage or perforation: Secondary | ICD-10-CM

## 2018-06-08 DIAGNOSIS — K5909 Other constipation: Secondary | ICD-10-CM

## 2018-06-08 DIAGNOSIS — K279 Peptic ulcer, site unspecified, unspecified as acute or chronic, without hemorrhage or perforation: Secondary | ICD-10-CM

## 2018-06-08 NOTE — Telephone Encounter (Signed)
Called and left message for pt asking for rtn call.

## 2018-06-08 NOTE — Telephone Encounter (Signed)
-----   Message from Irving Copas., MD sent at 06/05/2018  3:30 PM EST ----- Regarding: Update Cotey Rakes,Can you please reach out to the patient early next week and talk to her about [place] wanting to plan some repeat blood counts as well as iron studies.  As I finalized her note this week I thought it would be reasonable since we have not rechecked those since her hospital stay for [place] to ensure that those have all improved as well as the chance of knowing that she is not persistently anemic.  If she was I think at the next procedure visit which we said was good to be a few months down the road we may consider a colonoscopy as well.  If you can go ahead and ask if she is willing to come in in the next couple of weeks to have a CBC/iron/TIBC/ferritin/B12/folate levels performed that would be great.  If she has any concerns about the reasoning I am happy to give her a call but hopefully she understands what we are thinking about here.Thank you as always.GM

## 2018-06-08 NOTE — Telephone Encounter (Signed)
Pt informed. Pt will come in for labs on 06/23/2018. Order has been put in for labs.

## 2018-06-08 NOTE — Progress Notes (Signed)
Pt informed of results. Will come in on 06/23/2018 to have labs. Order has been placed.

## 2018-06-09 NOTE — Telephone Encounter (Signed)
   Primary Cardiologist: [name], MD  Chart reviewed as part of pre-operative protocol coverage. Cataract extractions are recognized in guidelines as low risk surgeries that do not typically require specific preoperative testing or holding of blood thinner therapy. Therefore, given past medical history and time since last visit, based on ACC/AHA guidelines, [name] would be at acceptable risk for the planned procedure without further cardiovascular testing.   I will route this recommendation to the requesting party via Epic fax function and remove from pre-op pool.  Please call with questions.  [place], [place] 06/09/2018, 1:12 PM

## 2018-06-21 DIAGNOSIS — H2511 Age-related nuclear cataract, right eye: Secondary | ICD-10-CM | POA: Diagnosis not present

## 2018-06-22 DIAGNOSIS — H2512 Age-related nuclear cataract, left eye: Secondary | ICD-10-CM | POA: Diagnosis not present

## 2018-06-23 ENCOUNTER — Other Ambulatory Visit (INDEPENDENT_AMBULATORY_CARE_PROVIDER_SITE_OTHER): Payer: Medicare Other

## 2018-06-23 DIAGNOSIS — Z0000 Encounter for general adult medical examination without abnormal findings: Secondary | ICD-10-CM

## 2018-06-23 DIAGNOSIS — K589 Irritable bowel syndrome without diarrhea: Secondary | ICD-10-CM

## 2018-06-23 DIAGNOSIS — K5909 Other constipation: Secondary | ICD-10-CM | POA: Diagnosis not present

## 2018-06-23 DIAGNOSIS — K253 Acute gastric ulcer without hemorrhage or perforation: Secondary | ICD-10-CM | POA: Diagnosis not present

## 2018-06-23 DIAGNOSIS — K279 Peptic ulcer, site unspecified, unspecified as acute or chronic, without hemorrhage or perforation: Secondary | ICD-10-CM

## 2018-06-23 LAB — LIPID PANEL
CHOLESTEROL: 201 mg/dL — AB (ref 0–200)
HDL: 87.8 mg/dL (ref >39.00)
LDL Cholesterol: 92 mg/dL (ref 0–99)
NonHDL: 113.17
Total CHOL/HDL Ratio: 2
Triglycerides: 105 mg/dL (ref 0.0–149.0)
VLDL: 21 mg/dL (ref 0.0–40.0)

## 2018-06-23 LAB — CBC
HCT: 34.3 % — ABNORMAL LOW (ref 36.0–46.0)
Hemoglobin: 11.1 g/dL — ABNORMAL LOW (ref 12.0–15.0)
MCHC: 32.4 g/dL (ref 30.0–36.0)
MCV: 78.2 fl (ref 78.0–100.0)
Platelets: 183 10*3/uL (ref 150.0–400.0)
RBC: 4.38 Mil/uL (ref 3.87–5.11)
RDW: 16.9 % — AB (ref 11.5–15.5)
WBC: 5.8 10*3/uL (ref 4.0–10.5)

## 2018-06-23 LAB — IRON, TOTAL/TOTAL IRON BINDING CAP
%SAT: 7 % (calc) — ABNORMAL LOW (ref 16–45)
Iron: 30 ug/dL — ABNORMAL LOW (ref 45–160)
TIBC: 415 mcg/dL (calc) (ref 250–450)

## 2018-06-23 NOTE — Patient Instructions (Addendum)
   Medications reviewed and updated.  Changes include :   none  Your prescription(s) have been submitted to your pharmacy. Please take as directed and contact our office if you believe you are having problem(s) with the medication(s).    Please followup in 6 months   

## 2018-06-23 NOTE — Progress Notes (Signed)
Subjective:    Patient ID: [name], female    DOB: 04/01/1933, 86 y.o.   MRN: [id number]  HPI The patient is here for follow up.    Hyperlipidemia: She is taking her medication daily. She is compliant with a low fat/cholesterol diet. She is exercising regularly. She denies myalgias.   Prediabetes:  She is compliant with a low sugar/carbohydrate diet.  She is exercising regularly.  Anxiety: She is taking her medication daily in the morning.. She denies any side effects from the medication.  Medication does help, but her stress level is still high.  She is difficulty dealing with her husband.  His memory is not that bad-more of the issues are related to his personality.  He tends depressed at night and that is very bothersome to her.  He also has other personality issues very difficult for her to handle-she states she is a type a personality.  She is thinking about seeing a counselor to help review with him better.  Insomnia:  She takes chloral hydrate nightly and has been on this for years.  Sleep is well controlled with this.  Osteopenia, high frax: she walks regularly.  She takes calcium and vitamin d daily.    Peptic ulcer disease: Last month she had an EGD and it showed persistent ulcer that was not bleeding.  She will have a repeat endoscopy the end of April or May.  She thinks some of that is related to her stress.  She is taking her medication as prescribed.  Medications and allergies reviewed with patient and updated if appropriate.  Patient Active Problem List   Diagnosis Date Noted  . Chronic gastric ulcer without hemorrhage and without perforation 06/05/2018  . Constipation, chronic 06/05/2018  . Callus of foot 03/04/2018  . Hammertoes of both feet 03/04/2018  . PUD (peptic ulcer disease) 12/21/2017  . Acute blood loss anemia 12/21/2017  . Hypertension 12/21/2017  . Syncope 12/14/2017  . Macrocytic anemia 12/14/2017  . Near syncope 12/14/2017  . Left  sided numbness 10/19/2017  . Small vessel disease, cerebrovascular 10/19/2017  . Numbness and tingling of left arm and leg 10/13/2017  . Anxiety 10/02/2017  . Right ear pain 09/04/2017  . Prediabetes 09/29/2016  . Lung nodule < 6cm on CT 09/29/2016  . Leg cramps 01/29/2016  . Insomnia 09/12/2015  . Rectal bleed 09/12/2015  . S/P AVR 11/16/2014  . Branch retinal vein occlusion 10/12/2013  . Aortic valve stenosis, severe   . ALLERGIC RHINITIS DUE TO OTHER ALLERGEN 09/10/2009  . Hyperlipidemia 11/30/2006  . MITRAL VALVE PROLAPSE 10/29/2006  . Osteopenia 10/29/2006  . History of right breast cancer 10/29/2006    Current Outpatient Medications on File Prior to Visit  Medication Sig Dispense Refill  . acetaminophen (TYLENOL) 500 MG tablet Take 500-1,000 mg by mouth at bedtime as needed for mild pain or moderate pain.    Marland Kitchen ALPRAZolam (XANAX) 0.5 MG tablet TAKE ONE TABLET BY MOUTH DAILY AS NEEDED FOR ANXIETY *DO NOT TAKE AT NIGHT* 30 tablet 0  . Calcium Carb-Cholecalciferol (CALCIUM 600+D3) 600-400 MG-UNIT TABS Take 1 tablet by mouth daily.    . Chloral Hydrate CRYS Take 10 mLs by mouth at bedtime as needed (Call  liquid into [place]). 900 g 1  . Cholecalciferol (VITAMIN D-3) 1000 UNITS CAPS Take 1 capsule by mouth daily.     . clindamycin (CLEOCIN) 300 MG capsule Take 600 mg by mouth as directed. Take 600 mg (2  capsules) by mouth one hour prior to dental procedures    . Coenzyme Q10 (CO Q 10 PO) Take 1 capsule by mouth daily.    . fluticasone (FLONASE) 50 MCG/ACT nasal spray Place 2 sprays into both nostrils daily. 16 g 6  . Magnesium 400 MG CAPS Take 400 mg by mouth every morning.     Marland Kitchen Ophthalmic Irrigation Solution (OCUSOFT EYE [place] OP) Apply 1 application to eye 2 (two) times daily as needed (dry eyes). Morning and before bed    . pantoprazole (PROTONIX) 40 MG tablet Take 1 tablet (40 mg total) by mouth 2 (two) times daily. 60 tablet 6  . Polyethyl Glycol-Propyl  Glycol (SYSTANE ULTRA PF) 0.4-0.3 % SOLN Apply 1 drop to eye 2 (two) times daily.    . rosuvastatin (CRESTOR) 10 MG tablet TAKE ONE TABLET BY MOUTH ONE TIME DAILY  90 tablet 1  . sucralfate (CARAFATE) 1 g tablet Take 1 tablet (1 g total) by mouth 3 (three) times daily. (Patient taking differently: Take 1 g by mouth 2 (two) times daily. ) 90 tablet 4  . Wheat Dextrin (BENEFIBER DRINK MIX PO) Take 5 mLs by mouth every morning. Mix with 1/2 a glass water, and takes with vitamins     No current facility-administered medications on file prior to visit.     Past Medical History:  Diagnosis Date  . Anxiety   . Aortic valve stenosis, severe   . Breast cancer ([place]) 2007   right  . Hiatal hernia   . Hypercholesteremia   . Hypoglycemia   . Osteopenia   . Personal history of radiation therapy   . Tingling   . Ulcer 2012   bleeding gastric Ulcer    Past Surgical History:  Procedure Laterality Date  . AORTIC VALVE REPLACEMENT N/A 11/16/2014   Procedure: AORTIC VALVE REPLACEMENT (AVR);  Surgeon: Gaye Pollack, MD;  Location: [place];  Service: Open Heart Surgery;  Laterality: N/A;  . BIOPSY  12/15/2017   Procedure: BIOPSY;  Surgeon: Rush Landmark Telford Nab., MD;  Location: [place];  Service: Gastroenterology;;  . BREAST BIOPSY    . BREAST LUMPECTOMY Right    2005?  Marland Kitchen BREAST SURGERY    . CARDIAC CATHETERIZATION N/A 10/18/2014   Procedure: Right/Left Heart Cath and Coronary Angiography;  Surgeon: [name], MD;  Location: [place] [facility];  Service: Cardiovascular;  Laterality: N/A;  . COSMETIC SURGERY     face  . ESOPHAGOGASTRODUODENOSCOPY (EGD) WITH PROPOFOL N/A 12/15/2017   Procedure: ESOPHAGOGASTRODUODENOSCOPY (EGD) WITH PROPOFOL;  Surgeon: Rush Landmark Telford Nab., MD;  Location: [place];  Service: Gastroenterology;  Laterality: N/A;  . PARATHYROIDECTOMY    . TEE WITHOUT CARDIOVERSION N/A 11/16/2014   Procedure: TRANSESOPHAGEAL ECHOCARDIOGRAM (TEE);  Surgeon: Gaye Pollack,  MD;  Location: [place];  Service: Open Heart Surgery;  Laterality: N/A;  . TONSILLECTOMY      Social History   Socioeconomic History  . Marital status: Married    Spouse name: Not on file  . Number of children: 3  . Years of education: 16  . Highest education level: Bachelor's degree (e.g., BA, AB, BS)  Occupational History  . Occupation: Retired  Scientific laboratory technician  . Financial resource strain: Not hard at all  . Food insecurity:    Worry: Never true    Inability: Never true  . Transportation needs:    Medical: No    Non-medical: No  Tobacco Use  . Smoking status: Former Smoker    Packs/day:  0.50    Years: 30.00    Pack years: 15.00    Types: Cigarettes  . Smokeless tobacco: Never Used  . Tobacco comment: quit smoking 1980  Substance and Sexual Activity  . Alcohol use: Yes    Alcohol/week: 7.0 standard drinks    Types: 7 Glasses of wine per week    Comment: wine nightly with dinner  . Drug use: No  . Sexual activity: Not on file  Lifestyle  . Physical activity:    Days per week: 0 days    Minutes per session: 0 min  . Stress: Rather much  Relationships  . Social connections:    Talks on phone: More than three times a week    Gets together: More than three times a week    Attends religious service: Not on file    Active member of club or organization: Not on file    Attends meetings of clubs or organizations: Not on file    Relationship status: Married  Other Topics Concern  . Not on file  Social History Narrative   Lives at [facility] with her husband.   Right-handed.   Caffeine use: 3-4 cups per day (some tea, mixes coffee with decaf).    Family History  Problem Relation Age of Onset  . Hypertension Mother   . Rectal cancer Mother   . Colon cancer Mother   . Kidney disease Father   . Esophageal cancer Neg Hx   . Stomach cancer Neg Hx     Review of Systems  Constitutional: Negative for chills and fever.  Respiratory: Negative for cough, shortness of  breath and wheezing.   Cardiovascular: Negative for chest pain, palpitations and leg swelling.  Neurological: Positive for headaches (stress headaches, occasional). Negative for light-headedness.       Objective:   Vitals:   06/24/18 1010  BP: 126/68  Pulse: (!) 57  Resp: 16  Temp: 97.8 F (36.6 C)  SpO2: 98%   BP Readings from Last 3 Encounters:  06/24/18 126/68  06/03/18 130/62  05/13/18 (!) 155/79   Wt Readings from Last 3 Encounters:  06/24/18 158 lb (71.7 kg)  06/03/18 158 lb (71.7 kg)  05/13/18 160 lb (72.6 kg)   Body mass index is 26.29 kg/m.   Physical Exam    Constitutional: Appears well-developed and well-nourished. No distress.  HENT:  Head: Normocephalic and atraumatic.  Neck: Neck supple. No tracheal deviation present. No thyromegaly present.  No cervical lymphadenopathy Cardiovascular: Normal rate, regular rhythm and normal heart sounds.   No murmur heard. No carotid bruit .  No edema Pulmonary/Chest: Effort normal and breath sounds normal. No respiratory distress. No has no wheezes. No rales.  Skin: Skin is warm and dry. Not diaphoretic.  Psychiatric: Normal mood and affect. Behavior is normal.      Assessment & Plan:    See Problem List for Assessment and Plan of chronic medical problems.

## 2018-06-24 ENCOUNTER — Ambulatory Visit (INDEPENDENT_AMBULATORY_CARE_PROVIDER_SITE_OTHER): Payer: Medicare Other | Admitting: Internal Medicine

## 2018-06-24 ENCOUNTER — Encounter: Payer: Self-pay | Admitting: Internal Medicine

## 2018-06-24 VITALS — BP 126/68 | HR 57 | Temp 97.8°F | Resp 16 | Ht 65.0 in | Wt 158.0 lb

## 2018-06-24 DIAGNOSIS — F419 Anxiety disorder, unspecified: Secondary | ICD-10-CM | POA: Diagnosis not present

## 2018-06-24 DIAGNOSIS — M85869 Other specified disorders of bone density and structure, unspecified lower leg: Secondary | ICD-10-CM

## 2018-06-24 DIAGNOSIS — F5101 Primary insomnia: Secondary | ICD-10-CM | POA: Diagnosis not present

## 2018-06-24 DIAGNOSIS — R7303 Prediabetes: Secondary | ICD-10-CM | POA: Diagnosis not present

## 2018-06-24 DIAGNOSIS — I10 Essential (primary) hypertension: Secondary | ICD-10-CM | POA: Diagnosis not present

## 2018-06-24 DIAGNOSIS — E782 Mixed hyperlipidemia: Secondary | ICD-10-CM | POA: Diagnosis not present

## 2018-06-24 LAB — VITAMIN B12: Vitamin B-12: 188 pg/mL — ABNORMAL LOW (ref 211–911)

## 2018-06-24 LAB — FERRITIN: Ferritin: 7.7 ng/mL — ABNORMAL LOW (ref 10.0–291.0)

## 2018-06-24 LAB — FOLATE: FOLATE: 6.2 ng/mL (ref >5.9)

## 2018-06-24 MED ORDER — ALPRAZOLAM 0.5 MG PO TABS: ORAL_TABLET | ORAL | 0 refills | Status: DC

## 2018-06-24 NOTE — Assessment & Plan Note (Signed)
Exercising regularly Taking calcium and vitamin D daily DEXA up-to-date

## 2018-06-24 NOTE — Assessment & Plan Note (Signed)
Continue statin, regular exercise and healthy diet Lipid panel pending

## 2018-06-24 NOTE — Assessment & Plan Note (Signed)
She does have blood work done yesterday so we will hold off on checking her A1c, which was mildly elevated 6 months ago-we will repeat in 6 months Continue with healthy diet and regular exercise

## 2018-06-24 NOTE — Assessment & Plan Note (Signed)
Anxiety and stress level-mostly related to her husband Taking Xanax once daily in the morning and it does help, but still feels anxious She is exercising regularly She is considering speaking to a therapist, which I feel is a good idea We can increase alprazolam to twice daily if needed, but is not ideal Discussed daily SSRI-deferred

## 2018-06-24 NOTE — Assessment & Plan Note (Signed)
BP well controlled Current regimen effective and well tolerated Continue current medications at current doses  

## 2018-06-25 ENCOUNTER — Telehealth: Payer: Self-pay | Admitting: Internal Medicine

## 2018-06-25 ENCOUNTER — Telehealth: Payer: Self-pay | Admitting: Gastroenterology

## 2018-06-25 ENCOUNTER — Encounter: Payer: Self-pay | Admitting: Internal Medicine

## 2018-06-25 DIAGNOSIS — D509 Iron deficiency anemia, unspecified: Secondary | ICD-10-CM

## 2018-06-25 NOTE — Telephone Encounter (Signed)
The pt was advised and will speak with her PCP regarding B 12 and will begin Iron daily. She will come in for repeat labs in 8 weeks.  Order in [place].

## 2018-06-25 NOTE — Telephone Encounter (Signed)
Mansouraty, Telford Nab., MD  Timothy Lasso, RN Cc: [name], MD        Alessio Bogan,  I have released the results to the patient via my chart.  The patient needs B12 supplementation and should be started on 2000 mcg daily. I will relay this information to her primary care provider as well in case she wants to go ahead and initiate her on intramuscular B12 injections.  The patient also needs to be initiated on iron supplementation. Ferrous gluconate 324 mg daily.  I would like the patient to undergo repeat labs in approximately 6 to 8 weeks with an iron/TIBC/ferritin/B12/CBC.  We can discuss after those labs return at a clinic visit to see what the next steps of her evaluation would be including possible colonoscopy/video capsule endoscopy.  Thank you for reaching out to her.  GM

## 2018-06-25 NOTE — Telephone Encounter (Signed)
Please call her-she had blood work done by GI-B12 level is very low and he was recommended to take oral B12.  He also discussed possibly injections.  I would recommend that she get B12 injections here this will increase her B12 level quicker.  Continue do B12 injections weekly for 4-5 weeks and continue oral supplementation at the same time.

## 2018-06-25 NOTE — Telephone Encounter (Signed)
Pt returned your call. Pls call her again at her cell phone.

## 2018-06-25 NOTE — Telephone Encounter (Signed)
Left detailed message per husband letting her know results below.

## 2018-06-26 ENCOUNTER — Encounter: Payer: Self-pay | Admitting: Internal Medicine

## 2018-07-05 DIAGNOSIS — H2512 Age-related nuclear cataract, left eye: Secondary | ICD-10-CM | POA: Diagnosis not present

## 2018-07-14 ENCOUNTER — Encounter: Payer: Self-pay | Admitting: Internal Medicine

## 2018-07-14 ENCOUNTER — Telehealth: Payer: Self-pay | Admitting: Gastroenterology

## 2018-07-14 ENCOUNTER — Ambulatory Visit (INDEPENDENT_AMBULATORY_CARE_PROVIDER_SITE_OTHER): Payer: Medicare Other | Admitting: Internal Medicine

## 2018-07-14 ENCOUNTER — Other Ambulatory Visit: Payer: Self-pay

## 2018-07-14 VITALS — BP 128/72 | HR 59 | Temp 97.8°F | Resp 16 | Ht 65.0 in | Wt 159.0 lb

## 2018-07-14 DIAGNOSIS — J309 Allergic rhinitis, unspecified: Secondary | ICD-10-CM

## 2018-07-14 DIAGNOSIS — J029 Acute pharyngitis, unspecified: Secondary | ICD-10-CM

## 2018-07-14 LAB — POCT RAPID STREP A (OFFICE): Rapid Strep A Screen: NEGATIVE

## 2018-07-14 MED ORDER — PANTOPRAZOLE SODIUM 40 MG PO TBEC: 40.0000 mg | DELAYED_RELEASE_TABLET | Freq: Two times a day (BID) | ORAL | 2 refills | Status: DC

## 2018-07-14 NOTE — Assessment & Plan Note (Addendum)
Rapid strep negative Symptoms are mild and consistent with allergies Continue Allegra and take daily Has Flonase and can start taking that daily if needed as well as saline nasal spray Call if symptoms worsen or change

## 2018-07-14 NOTE — Telephone Encounter (Signed)
90 day supply of pantoprazole sent to pharmacy. Pt informed.

## 2018-07-14 NOTE — Patient Instructions (Signed)
Take your allegra daily.  Use the flonase and saline nasal spray as needed.   Call if your symptoms worsen or change.      Allergic Rhinitis, Adult Allergic rhinitis is an allergic reaction that affects the mucous membrane inside the nose. It causes sneezing, a runny or stuffy nose, and the feeling of mucus going down the back of the throat (postnasal drip). Allergic rhinitis can be mild to severe. There are two types of allergic rhinitis:  Seasonal. This type is also called hay fever. It happens only during certain seasons.  Perennial. This type can happen at any time of the year. What are the causes? This condition happens when the body's defense system (immune system) responds to certain harmless substances called allergens as though they were germs.  Seasonal allergic rhinitis is triggered by pollen, which can come from grasses, trees, and weeds. Perennial allergic rhinitis may be caused by:  House dust mites.  Pet dander.  Mold spores. What are the signs or symptoms? Symptoms of this condition include:  Sneezing.  Runny or stuffy nose (nasal congestion).  Postnasal drip.  Itchy nose.  Tearing of the eyes.  Trouble sleeping.  Daytime sleepiness. How is this diagnosed? This condition may be diagnosed based on:  Your medical history.  A physical exam.  Tests to check for related conditions, such as: ? Asthma. ? Pink eye. ? Ear infection. ? Upper respiratory infection.  Tests to find out which allergens trigger your symptoms. These may include skin or blood tests. How is this treated? There is no cure for this condition, but treatment can help control symptoms. Treatment may include:  Taking medicines that block allergy symptoms, such as antihistamines. Medicine may be given as a shot, nasal spray, or pill.  Avoiding the allergen.  Desensitization. This treatment involves getting ongoing shots until your body becomes less sensitive to the allergen. This  treatment may be done if other treatments do not help.  If taking medicine and avoiding the allergen does not work, new, stronger medicines may be prescribed. Follow these instructions at home:  Find out what you are allergic to. Common allergens include smoke, dust, and pollen.  Avoid the things you are allergic to. These are some things you can do to help avoid allergens: ? Replace carpet with wood, tile, or vinyl flooring. Carpet can trap dander and dust. ? Do not smoke. Do not allow smoking in your home. ? Change your heating and air conditioning filter at least once a month. ? During allergy season:  Keep windows closed as much as possible.  Plan outdoor activities when pollen counts are lowest. This is usually during the evening hours.  When coming indoors, change clothing and shower before sitting on furniture or bedding.  Take over-the-counter and prescription medicines only as told by your health care provider.  Keep all follow-up visits as told by your health care provider. This is important. Contact a health care provider if:  You have a fever.  You develop a persistent cough.  You make whistling sounds when you breathe (you wheeze).  Your symptoms interfere with your normal daily activities. Get help right away if:  You have shortness of breath. Summary  This condition can be managed by taking medicines as directed and avoiding allergens.  Contact your health care provider if you develop a persistent cough or fever.  During allergy season, keep windows closed as much as possible. This information is not intended to replace advice given to you by  your health care provider. Make sure you discuss any questions you have with your health care provider. Document Released: 01/07/2001 Document Revised: 05/22/2016 Document Reviewed: 05/22/2016 Elsevier Interactive Patient Education  2019 [employer].

## 2018-07-14 NOTE — Progress Notes (Signed)
Subjective:    Patient ID: [name], female    DOB: [birth year], 85 y.o.   MRN: [id number]  HPI She is here for an acute visit for cold symptoms.  Her symptoms started 5 days ago  She is experiencing runny nose, ear pressure but not today, sore throat but better today, swollen glands.   She denies other symptoms including fever, cough, shortness of breath, headaches, sinus pain and diarrhea.  She has taken allegra  Medications and allergies reviewed with patient and updated if appropriate.  Patient Active Problem List   Diagnosis Date Noted  . Chronic gastric ulcer without hemorrhage and without perforation 06/05/2018  . Constipation, chronic 06/05/2018  . Callus of foot 03/04/2018  . Hammertoes of both feet 03/04/2018  . PUD (peptic ulcer disease) 12/21/2017  . Acute blood loss anemia 12/21/2017  . Hypertension 12/21/2017  . Syncope 12/14/2017  . Macrocytic anemia 12/14/2017  . Near syncope 12/14/2017  . Left sided numbness 10/19/2017  . Small vessel disease, cerebrovascular 10/19/2017  . Numbness and tingling of left arm and leg 10/13/2017  . Anxiety 10/02/2017  . Right ear pain 09/04/2017  . Prediabetes 09/29/2016  . Lung nodule < 6cm on CT 09/29/2016  . Leg cramps 01/29/2016  . Insomnia 09/12/2015  . Rectal bleed 09/12/2015  . S/P AVR 11/16/2014  . Branch retinal vein occlusion 10/12/2013  . Aortic valve stenosis, severe   . ALLERGIC RHINITIS DUE TO OTHER ALLERGEN 09/10/2009  . Hyperlipidemia 11/30/2006  . MITRAL VALVE PROLAPSE 10/29/2006  . Osteopenia 10/29/2006  . History of right breast cancer 10/29/2006    Current Outpatient Medications on File Prior to Visit  Medication Sig Dispense Refill  . acetaminophen (TYLENOL) 500 MG tablet Take 500-1,000 mg by mouth at bedtime as needed for mild pain or moderate pain.    Marland Kitchen ALPRAZolam (XANAX) 0.5 MG tablet TAKE ONE TABLET BY MOUTH DAILY AS NEEDED FOR ANXIETY *DO NOT TAKE AT NIGHT* 90 tablet 0  .  Calcium Carb-Cholecalciferol (CALCIUM 600+D3) 600-400 MG-UNIT TABS Take 1 tablet by mouth daily.    . Chloral Hydrate CRYS Take 10 mLs by mouth at bedtime as needed (Call  liquid into [place]). 900 g 1  . Cholecalciferol (VITAMIN D-3) 1000 UNITS CAPS Take 1 capsule by mouth daily.     . clindamycin (CLEOCIN) 300 MG capsule Take 600 mg by mouth as directed. Take 600 mg (2 capsules) by mouth one hour prior to dental procedures    . Coenzyme Q10 (CO Q 10 PO) Take 1 capsule by mouth daily.    . fluticasone (FLONASE) 50 MCG/ACT nasal spray Place 2 sprays into both nostrils daily. 16 g 6  . Magnesium 400 MG CAPS Take 400 mg by mouth every morning.     Marland Kitchen Ophthalmic Irrigation Solution (OCUSOFT EYE [place] OP) Apply 1 application to eye 2 (two) times daily as needed (dry eyes). Morning and before bed    . Polyethyl Glycol-Propyl Glycol (SYSTANE ULTRA PF) 0.4-0.3 % SOLN Apply 1 drop to eye 2 (two) times daily.    . rosuvastatin (CRESTOR) 10 MG tablet TAKE ONE TABLET BY MOUTH ONE TIME DAILY  90 tablet 1  . sucralfate (CARAFATE) 1 g tablet Take 1 tablet (1 g total) by mouth 3 (three) times daily. (Patient taking differently: Take 1 g by mouth 2 (two) times daily. ) 90 tablet 4  . Wheat Dextrin (BENEFIBER DRINK MIX PO) Take 5 mLs by mouth every morning. Mix with  1/2 a glass water, and takes with vitamins     No current facility-administered medications on file prior to visit.     Past Medical History:  Diagnosis Date  . Anxiety   . Aortic valve stenosis, severe   . Breast cancer ([place]) 2007   right  . Hiatal hernia   . Hypercholesteremia   . Hypoglycemia   . Osteopenia   . Personal history of radiation therapy   . Tingling   . Ulcer 2012   bleeding gastric Ulcer    Past Surgical History:  Procedure Laterality Date  . AORTIC VALVE REPLACEMENT N/A 11/16/2014   Procedure: AORTIC VALVE REPLACEMENT (AVR);  Surgeon: Gaye Pollack, MD;  Location: [place];  Service: Open Heart Surgery;   Laterality: N/A;  . BIOPSY  12/15/2017   Procedure: BIOPSY;  Surgeon: Rush Landmark Telford Nab., MD;  Location: [place];  Service: Gastroenterology;;  . BREAST BIOPSY    . BREAST LUMPECTOMY Right    2005?  Marland Kitchen BREAST SURGERY    . CARDIAC CATHETERIZATION N/A 10/18/2014   Procedure: Right/Left Heart Cath and Coronary Angiography;  Surgeon: [name], MD;  Location: [place] [facility];  Service: Cardiovascular;  Laterality: N/A;  . COSMETIC SURGERY     face  . ESOPHAGOGASTRODUODENOSCOPY (EGD) WITH PROPOFOL N/A 12/15/2017   Procedure: ESOPHAGOGASTRODUODENOSCOPY (EGD) WITH PROPOFOL;  Surgeon: Rush Landmark Telford Nab., MD;  Location: [place];  Service: Gastroenterology;  Laterality: N/A;  . PARATHYROIDECTOMY    . TEE WITHOUT CARDIOVERSION N/A 11/16/2014   Procedure: TRANSESOPHAGEAL ECHOCARDIOGRAM (TEE);  Surgeon: Gaye Pollack, MD;  Location: [place];  Service: Open Heart Surgery;  Laterality: N/A;  . TONSILLECTOMY      Social History   Socioeconomic History  . Marital status: Married    Spouse name: Not on file  . Number of children: 3  . Years of education: 16  . Highest education level: Bachelor's degree (e.g., BA, AB, BS)  Occupational History  . Occupation: Retired  Scientific laboratory technician  . Financial resource strain: Not hard at all  . Food insecurity:    Worry: Never true    Inability: Never true  . Transportation needs:    Medical: No    Non-medical: No  Tobacco Use  . Smoking status: Former Smoker    Packs/day: 0.50    Years: 30.00    Pack years: 15.00    Types: Cigarettes  . Smokeless tobacco: Never Used  . Tobacco comment: quit smoking 1980  Substance and Sexual Activity  . Alcohol use: Yes    Alcohol/week: 7.0 standard drinks    Types: 7 Glasses of wine per week    Comment: wine nightly with dinner  . Drug use: No  . Sexual activity: Not on file  Lifestyle  . Physical activity:    Days per week: 0 days    Minutes per session: 0 min  . Stress: Rather much   Relationships  . Social connections:    Talks on phone: More than three times a week    Gets together: More than three times a week    Attends religious service: Not on file    Active member of club or organization: Not on file    Attends meetings of clubs or organizations: Not on file    Relationship status: Married  Other Topics Concern  . Not on file  Social History Narrative   Lives at [facility] with her husband.   Right-handed.   Caffeine use: 3-4 cups per  day (some tea, mixes coffee with decaf).    Family History  Problem Relation Age of Onset  . Hypertension Mother   . Rectal cancer Mother   . Colon cancer Mother   . Kidney disease Father   . Esophageal cancer Neg Hx   . Stomach cancer Neg Hx     Review of Systems  Constitutional: Negative for chills and fever.  HENT: Positive for rhinorrhea and sore throat (better). Negative for congestion, ear pain (ear pressure), postnasal drip, sinus pressure and sinus pain.        Swollen neck glands  Respiratory: Negative for cough, shortness of breath and wheezing.   Gastrointestinal: Negative for diarrhea and nausea.  Musculoskeletal: Positive for back pain (at times).  Neurological: Negative for dizziness, light-headedness and headaches.       Objective:   Vitals:   07/14/18 1448  BP: 128/72  Pulse: (!) 59  Resp: 16  Temp: 97.8 F (36.6 C)  SpO2: 96%   Filed Weights   07/14/18 1448  Weight: 159 lb (72.1 kg)   Body mass index is 26.46 kg/m.  Wt Readings from Last 3 Encounters:  07/14/18 159 lb (72.1 kg)  06/24/18 158 lb (71.7 kg)  06/03/18 158 lb (71.7 kg)     Physical Exam GENERAL APPEARANCE: Appears stated age, well appearing, NAD EYES: conjunctiva clear, no icterus HEENT: bilateral tympanic membranes and ear canals normal, oropharynx with mild erythema, no thyromegaly, trachea midline, no cervical or supraclavicular lymphadenopathy LUNGS: Clear to auscultation without wheeze or crackles, unlabored  breathing, good air entry bilaterally CARDIOVASCULAR: Normal S1,S2 without murmurs, no edema SKIN: warm, dry        Assessment & Plan:   See Problem List for Assessment and Plan of chronic medical problems.

## 2018-07-14 NOTE — Telephone Encounter (Signed)
Spoke with pt and she is needing a new prescription for medication for 90days instead of the 30day

## 2018-07-22 ENCOUNTER — Encounter: Payer: Self-pay | Admitting: Internal Medicine

## 2018-07-22 ENCOUNTER — Other Ambulatory Visit: Payer: Self-pay

## 2018-07-22 MED ORDER — CHLORAL HYDRATE CRYS: 10.0000 mL | CRYSTALS | Freq: Every evening | 1 refills | Status: DC | PRN

## 2018-07-23 DIAGNOSIS — D485 Neoplasm of uncertain behavior of skin: Secondary | ICD-10-CM | POA: Diagnosis not present

## 2018-07-23 DIAGNOSIS — L570 Actinic keratosis: Secondary | ICD-10-CM | POA: Diagnosis not present

## 2018-08-31 DIAGNOSIS — H35371 Puckering of macula, right eye: Secondary | ICD-10-CM | POA: Diagnosis not present

## 2018-09-14 ENCOUNTER — Encounter: Payer: Self-pay | Admitting: Internal Medicine

## 2018-09-14 NOTE — Telephone Encounter (Signed)
Last routine visit 06/24/18 Next OV 12/23/18 Last RF 06/30/18

## 2018-09-15 ENCOUNTER — Encounter: Payer: Self-pay | Admitting: Internal Medicine

## 2018-09-16 DIAGNOSIS — J309 Allergic rhinitis, unspecified: Secondary | ICD-10-CM | POA: Diagnosis not present

## 2018-09-16 MED ORDER — ALPRAZOLAM 0.5 MG PO TABS: ORAL_TABLET | ORAL | 0 refills | Status: DC

## 2018-09-27 ENCOUNTER — Ambulatory Visit (AMBULATORY_SURGERY_CENTER): Payer: Self-pay | Admitting: *Deleted

## 2018-09-27 ENCOUNTER — Other Ambulatory Visit: Payer: Self-pay

## 2018-09-27 VITALS — Ht 65.0 in | Wt 154.0 lb

## 2018-09-27 DIAGNOSIS — K257 Chronic gastric ulcer without hemorrhage or perforation: Secondary | ICD-10-CM

## 2018-09-27 NOTE — Progress Notes (Signed)
Telephone interview for Previsit Husband's cell number for day of procedure : [phone]  No egg or soy allergy known to patient  No issues with past sedation with any surgeries  or procedures, no intubation problems  No diet pills per patient No home 02 use per patient  No blood thinners per patient  Pt denies issues with constipation  No A fib or A flutter  EMMI video sent to pt's e mail

## 2018-10-06 ENCOUNTER — Ambulatory Visit (INDEPENDENT_AMBULATORY_CARE_PROVIDER_SITE_OTHER): Payer: Medicare Other | Admitting: Family

## 2018-10-06 ENCOUNTER — Encounter: Payer: Self-pay | Admitting: Family

## 2018-10-06 ENCOUNTER — Other Ambulatory Visit: Payer: Self-pay

## 2018-10-06 VITALS — BP 132/78 | HR 63 | Temp 97.8°F | Ht 65.0 in | Wt 160.4 lb

## 2018-10-06 DIAGNOSIS — L03114 Cellulitis of left upper limb: Secondary | ICD-10-CM

## 2018-10-06 MED ORDER — DOXYCYCLINE HYCLATE 100 MG PO TABS: 100.0000 mg | ORAL_TABLET | Freq: Two times a day (BID) | ORAL | 0 refills | Status: DC

## 2018-10-06 NOTE — Progress Notes (Signed)
[name] is a 86 y.o. female with the following history as recorded in EpicCare:  Patient Active Problem List   Diagnosis Date Noted  . Chronic gastric ulcer without hemorrhage and without perforation 06/05/2018  . Constipation, chronic 06/05/2018  . Callus of foot 03/04/2018  . Hammertoes of both feet 03/04/2018  . PUD (peptic ulcer disease) 12/21/2017  . Acute blood loss anemia 12/21/2017  . Hypertension 12/21/2017  . Syncope 12/14/2017  . Macrocytic anemia 12/14/2017  . Near syncope 12/14/2017  . Left sided numbness 10/19/2017  . Small vessel disease, cerebrovascular 10/19/2017  . Numbness and tingling of left arm and leg 10/13/2017  . Anxiety 10/02/2017  . Right ear pain 09/04/2017  . Prediabetes 09/29/2016  . Lung nodule < 6cm on CT 09/29/2016  . Leg cramps 01/29/2016  . Insomnia 09/12/2015  . Rectal bleed 09/12/2015  . S/P AVR 11/16/2014  . Branch retinal vein occlusion 10/12/2013  . Aortic valve stenosis, severe   . Allergic rhinitis 09/10/2009  . Hyperlipidemia 11/30/2006  . MITRAL VALVE PROLAPSE 10/29/2006  . Osteopenia 10/29/2006  . History of right breast cancer 10/29/2006    Current Outpatient Medications  Medication Sig Dispense Refill  . acetaminophen (TYLENOL) 500 MG tablet Take 500-1,000 mg by mouth at bedtime as needed for mild pain or moderate pain.    Marland Kitchen ALPRAZolam (XANAX) 0.5 MG tablet TAKE ONE TABLET BY MOUTH DAILY AS NEEDED FOR ANXIETY *DO NOT TAKE AT NIGHT* 90 tablet 0  . Calcium Carb-Cholecalciferol (CALCIUM 600+D3) 600-400 MG-UNIT TABS Take 1 tablet by mouth daily.    . Chloral Hydrate CRYS Take 10 mLs by mouth at bedtime as needed (Call  liquid into [place]). 900 g 1  . Cholecalciferol (VITAMIN D-3) 1000 UNITS CAPS Take 1 capsule by mouth daily.     . clindamycin (CLEOCIN) 300 MG capsule Take 600 mg by mouth as directed. Take 600 mg (2 capsules) by mouth one hour prior to dental procedures    . Coenzyme Q10 (CO Q 10  PO) Take 1 capsule by mouth daily.    . fluticasone (FLONASE) 50 MCG/ACT nasal spray Place 2 sprays into both nostrils daily. 16 g 6  . Magnesium 400 MG CAPS Take 400 mg by mouth every morning.     . montelukast (SINGULAIR) 10 MG tablet     . Ophthalmic Irrigation Solution (OCUSOFT EYE Coffee OP) Apply 1 application to eye 2 (two) times daily as needed (dry eyes). Morning and before bed    . pantoprazole (PROTONIX) 40 MG tablet Take 1 tablet (40 mg total) by mouth 2 (two) times daily. 180 tablet 2  . Polyethyl Glycol-Propyl Glycol (SYSTANE ULTRA PF) 0.4-0.3 % SOLN Apply 1 drop to eye 2 (two) times daily.    . rosuvastatin (CRESTOR) 10 MG tablet TAKE ONE TABLET BY MOUTH ONE TIME DAILY  90 tablet 1  . sucralfate (CARAFATE) 1 g tablet Take 1 tablet (1 g total) by mouth 3 (three) times daily. 90 tablet 4  . Wheat Dextrin (BENEFIBER DRINK MIX PO) Take 5 mLs by mouth every morning. Mix with 1/2 a glass water, and takes with vitamins    . doxycycline (VIBRA-TABS) 100 MG tablet Take 1 tablet (100 mg total) by mouth 2 (two) times daily. 14 tablet 0   No current facility-administered medications for this visit.     Allergies: Amoxicillin-pot clavulanate; Penicillin g; Penicillins; Sulfa antibiotics; Sulfamethoxazole; and Sulfonamide derivatives  Past Medical History:  Diagnosis Date  . Allergy   .  Anemia   . Anxiety   . Aortic valve stenosis, severe   . Breast cancer ([place]) 2007   right  . Cataract   . Hiatal hernia   . Hypercholesteremia   . Hypoglycemia   . Osteopenia   . Personal history of radiation therapy   . Tingling   . Ulcer 2012   bleeding gastric Ulcer    Past Surgical History:  Procedure Laterality Date  . AORTIC VALVE REPLACEMENT N/A 11/16/2014   Procedure: AORTIC VALVE REPLACEMENT (AVR);  Surgeon: Gaye Pollack, MD;  Location: [facility];  Service: Open Heart Surgery;  Laterality: N/A;  . BIOPSY  12/15/2017   Procedure: BIOPSY;  Surgeon: Rush Landmark Telford Nab., MD;  Location: [facility];  Service: Gastroenterology;;  . BREAST BIOPSY    . BREAST LUMPECTOMY Right    2005?  Marland Kitchen BREAST SURGERY    . CARDIAC CATHETERIZATION N/A 10/18/2014   Procedure: Right/Left Heart Cath and Coronary Angiography;  Surgeon: [name], MD;  Location: [facility];  Service: Cardiovascular;  Laterality: N/A;  . COSMETIC SURGERY     face  . ESOPHAGOGASTRODUODENOSCOPY (EGD) WITH PROPOFOL N/A 12/15/2017   Procedure: ESOPHAGOGASTRODUODENOSCOPY (EGD) WITH PROPOFOL;  Surgeon: Rush Landmark Telford Nab., MD;  Location: [facility];  Service: Gastroenterology;  Laterality: N/A;  . PARATHYROIDECTOMY    . TEE WITHOUT CARDIOVERSION N/A 11/16/2014   Procedure: TRANSESOPHAGEAL ECHOCARDIOGRAM (TEE);  Surgeon: Gaye Pollack, MD;  Location: [facility];  Service: Open Heart Surgery;  Laterality: N/A;  . TONSILLECTOMY      Family History  Problem Relation Age of Onset  . Hypertension Mother   . Rectal cancer Mother   . Colon cancer Mother   . Kidney disease Father   . Esophageal cancer Neg Hx   . Stomach cancer Neg Hx   . Colon polyps Neg Hx     Social History   Tobacco Use  . Smoking status: Former Smoker    Packs/day: 0.50    Years: 30.00    Pack years: 15.00    Types: Cigarettes  . Smokeless tobacco: Never Used  . Tobacco comment: quit smoking 1980  Substance Use Topics  . Alcohol use: Yes    Alcohol/week: 7.0 standard drinks    Types: 7 Glasses of wine per week    Comment: wine nightly with dinner    Subjective:  Patient presents with concerns for possible infected insect bite; bitten approximately 1 1/2 week ago on left forearm; has tried OTC hydrocortisone cream with limited benefit; no streaking; no fever;    Objective:  Vitals:   10/06/18 1247  BP: 132/78  Pulse: 63  Temp: 97.8 F (36.6 C)  TempSrc: Oral  SpO2: 96%  Weight: 160 lb 6.4 oz (72.8 kg)  Height: 5\' 5"  (1.651 m)    General: Well developed, well nourished, in no acute distress  Skin : Warm and dry. Red  papular lesion noted in left antecubetal space Head: Normocephalic and atraumatic  Lungs: Respirations unlabored;  Neurologic: Alert and oriented; speech intact; face symmetrical; moves all extremities well; CNII-XII intact without focal deficit  Assessment:  1. Cellulitis of left upper extremity     Plan:  Rx for Doxycycline 100 mg bid x 7 days, apply ice and continue hydrocortisone cream; follow-up worse, no better.   No follow-ups on file.  No orders of the defined types were placed in this encounter.   Requested Prescriptions   Signed Prescriptions Disp Refills  . doxycycline (VIBRA-TABS) 100 MG tablet  14 tablet 0    Sig: Take 1 tablet (100 mg total) by mouth 2 (two) times daily.

## 2018-10-19 ENCOUNTER — Encounter: Payer: Medicare Other | Admitting: Gastroenterology

## 2018-10-27 ENCOUNTER — Telehealth: Payer: Self-pay | Admitting: Gastroenterology

## 2018-10-27 NOTE — Telephone Encounter (Signed)

## 2018-10-28 ENCOUNTER — Ambulatory Visit (AMBULATORY_SURGERY_CENTER): Payer: Medicare Other | Admitting: Gastroenterology

## 2018-10-28 ENCOUNTER — Encounter: Payer: Self-pay | Admitting: Gastroenterology

## 2018-10-28 ENCOUNTER — Other Ambulatory Visit: Payer: Self-pay

## 2018-10-28 VITALS — BP 120/47 | HR 56 | Temp 97.4°F | Resp 20 | Ht 65.0 in | Wt 154.0 lb

## 2018-10-28 DIAGNOSIS — K3189 Other diseases of stomach and duodenum: Secondary | ICD-10-CM

## 2018-10-28 DIAGNOSIS — K219 Gastro-esophageal reflux disease without esophagitis: Secondary | ICD-10-CM | POA: Diagnosis not present

## 2018-10-28 DIAGNOSIS — K259 Gastric ulcer, unspecified as acute or chronic, without hemorrhage or perforation: Secondary | ICD-10-CM | POA: Diagnosis not present

## 2018-10-28 DIAGNOSIS — K257 Chronic gastric ulcer without hemorrhage or perforation: Secondary | ICD-10-CM

## 2018-10-28 DIAGNOSIS — K449 Diaphragmatic hernia without obstruction or gangrene: Secondary | ICD-10-CM

## 2018-10-28 MED ORDER — SODIUM CHLORIDE 0.9 % IV SOLN: 500.0000 mL | Freq: Once | INTRAVENOUS | Status: DC

## 2018-10-28 NOTE — Progress Notes (Signed)
Called to room to assist during endoscopic procedure.  Patient ID and intended procedure confirmed with present staff. Received instructions for my participation in the procedure from the performing physician.  

## 2018-10-28 NOTE — Progress Notes (Signed)
To PACU, VSS. Report to [email or web address] 

## 2018-10-28 NOTE — Progress Notes (Signed)
Pt's states no medical or surgical changes since previsit or office visit.  [name]

## 2018-10-28 NOTE — Op Note (Signed)
[facility] Patient Name: [name] Procedure Date: 10/28/2018 8:44 AM MRN: [id number] Endoscopist: Justice Britain , MD Age: 85 Referring MD:  Date of Birth: [birth year] Gender: Female Account #: [id number] Procedure:                Upper GI endoscopy Indications:              Surveillance procedure, Follow-up of chronic                            gastric ulcer Medicines:                Monitored Anesthesia Care Procedure:                Pre-Anesthesia Assessment:                           - Prior to the procedure, a History and Physical                            was performed, and patient medications and                            allergies were reviewed. The patient's tolerance of                            previous anesthesia was also reviewed. The risks                            and benefits of the procedure and the sedation                            options and risks were discussed with the patient.                            All questions were answered, and informed consent                            was obtained. Prior Anticoagulants: The patient has                            taken no previous anticoagulant or antiplatelet                            agents. ASA Grade Assessment: II - A patient with                            mild systemic disease. After reviewing the risks                            and benefits, the patient was deemed in                            satisfactory condition to undergo the procedure.  After obtaining informed consent, the endoscope was                            passed under direct vision. Throughout the                            procedure, the patient's blood pressure, pulse, and                            oxygen saturations were monitored continuously. The                            Model GIF-[id number]) scope was introduced                            through the mouth, and advanced to the  second part                            of duodenum. The upper GI endoscopy was                            accomplished without difficulty. The patient                            tolerated the procedure. Scope In: Scope Out: Findings:                 No gross lesions were noted in the entire esophagus.                           A small hiatal hernia was present.                           Localized moderately erythematous mucosa without                            bleeding was found on the anterior wall of the                            gastric antrum and on the lesser curvature of the                            gastric antrum. Biopsies were taken with a cold                            forceps for histology.                           No other gross lesions were noted in the entire                            examined stomach.                           No gross lesions were noted in the  duodenal bulb,                            in the first portion of the duodenum and in the                            second portion of the duodenum. Complications:            No immediate complications. Estimated Blood Loss:     Estimated blood loss was minimal. Impression:               - No gross lesions in esophagus.                           - Small hiatal hernia.                           - Erythematous mucosa in the anterior wall of the                            gastric antrum and lesser curvature of the gastric                            antrum. Biopsied.                           - No other gross lesions in the stomach.                           - No gross lesions in the duodenal bulb, in the                            first portion of the duodenum and in the second                            portion of the duodenum. Recommendation:           - The patient will be observed post-procedure,                            until all discharge criteria are met.                           - Discharge patient  to home.                           - Patient has a contact number available for                            emergencies. The signs and symptoms of potential                            delayed complications were discussed with the                            patient. Return to normal  activities tomorrow.                            Written discharge instructions were provided to the                            patient.                           - Resume previous diet.                           - Continue present medications.                           - Continue PPI BID. Will remove Carafate from                            medication list as she did not tolerate.                           - Follow up in clinic in 3-4 months to discuss how                            she is doing. Will consider a 1-year EGD/EUS follow                            up.                           - The findings and recommendations were discussed                            with the patient. Justice Britain, MD 10/28/2018 9:20:34 AM

## 2018-10-28 NOTE — Patient Instructions (Signed)
The ulcer has completely healed.  Biopsies taken today.  Wait for pathology letter in about 2 weeks. Continue PPI twice daily.  A small hiatal hernia was noted today.  See handout on GERD protocol.  Please stop taking the Carafate. Dr Rush Landmark wants to see you in office/clinic in 3-4 months to discuss how you are doing. And he probably wants another EGD in about a year.   YOU HAD AN ENDOSCOPIC PROCEDURE TODAY AT [facility]:   Refer to the procedure report that was given to you for any specific questions about what was found during the examination.  If the procedure report does not answer your questions, please call your gastroenterologist to clarify.  If you requested that your care partner not be given the details of your procedure findings, then the procedure report has been included in a sealed envelope for you to review at your convenience later.  YOU SHOULD EXPECT: Some feelings of bloating in the abdomen. Passage of more gas than usual.  Walking can help get rid of the air that was put into your GI tract during the procedure and reduce the bloating. If you had a lower endoscopy (such as a colonoscopy or flexible sigmoidoscopy) you may notice spotting of blood in your stool or on the toilet paper. If you underwent a bowel prep for your procedure, you may not have a normal bowel movement for a few days.  Please Note:  You might notice some irritation and congestion in your nose or some drainage.  This is from the oxygen used during your procedure.  There is no need for concern and it should clear up in a day or so.  SYMPTOMS TO REPORT IMMEDIATELY:   Following upper endoscopy (EGD)  Vomiting of blood or coffee ground material  New chest pain or pain under the shoulder blades  Painful or persistently difficult swallowing  New shortness of breath  Fever of 100F or higher  Black, tarry-looking stools  For urgent or emergent issues, a gastroenterologist can be reached at  any hour by calling [phone].   DIET:  We do recommend a small meal at first, but then you may proceed to your regular diet.  Drink plenty of fluids but you should avoid alcoholic beverages for 24 hours.  ACTIVITY:  You should plan to take it easy for the rest of today and you should NOT DRIVE or use heavy machinery until tomorrow (because of the sedation medicines used during the test).    FOLLOW UP: Our staff will call the number listed on your records 48-72 hours following your procedure to check on you and address any questions or concerns that you may have regarding the information given to you following your procedure. If we do not reach you, we will leave a message.  We will attempt to reach you two times.  During this call, we will ask if you have developed any symptoms of COVID 19. If you develop any symptoms (ie: fever, flu-like symptoms, shortness of breath, cough etc.) before then, please call [phone].  If you test positive for Covid 19 in the 2 weeks post procedure, please call and report this information to [place].    If any biopsies were taken you will be contacted by phone or by letter within the next 1-3 weeks.  Please call us at [phone] if you have not heard about the biopsies in 3 weeks.    SIGNATURES/CONFIDENTIALITY: You and/or your care partner have signed paperwork  which will be entered into your electronic medical record.  These signatures attest to the fact that that the information above on your After Visit Summary has been reviewed and is understood.  Full responsibility of the confidentiality of this discharge information lies with you and/or your care-partner.

## 2018-11-01 ENCOUNTER — Telehealth: Payer: Self-pay | Admitting: *Deleted

## 2018-11-01 NOTE — Telephone Encounter (Signed)
  Follow up Call-  Call back number 10/28/2018 05/13/2018 02/23/2018  Post procedure Call Back phone  # [phone] Please call after [phone]  Permission to leave phone message Yes Yes Yes  Some recent data might be hidden     Patient questions:  Do you have a fever, pain , or abdominal swelling? No. Pain Score  0 *  Have you tolerated food without any problems? Yes.    Have you been able to return to your normal activities? Yes.    Do you have any questions about your discharge instructions: Diet   No. Medications  No. Follow up visit  No.  Do you have questions or concerns about your Care? No.  Actions: * If pain score is 4 or above: No action needed, pain <4.  1. Have you developed a fever since your procedure? no  2.   Have you had an respiratory symptoms (SOB or cough) since your procedure? no  3.   Have you tested positive for COVID 19 since your procedure no  4.   Have you had any family members/close contacts diagnosed with the COVID 19 since your procedure?  no   If yes to any of these questions please route to Joylene John, RN and Alphonsa Gin, Therapist, sports.

## 2018-11-07 ENCOUNTER — Encounter: Payer: Self-pay | Admitting: Gastroenterology

## 2018-11-08 ENCOUNTER — Telehealth: Payer: Self-pay | Admitting: Gastroenterology

## 2018-11-08 NOTE — Telephone Encounter (Signed)
Patient called would like to know why she is scheduled for a visit and her pathology results. She would like to speak to someone.

## 2018-11-08 NOTE — Telephone Encounter (Signed)
The pt was given the information in the letter.  All questions have been answered.     11/07/2018 MRN: [id number]   [name] [address] [place] [address]  Dear [name],  I am writing to inform you that the biopsies taken during your recent endoscopic examination showed:   Diagnosis Surgical [P], gastric antrum - REACTIVE GASTROPATHY WITH ULCERATION AND ACUTE INFLAMMATION - NO H. PYLORI OR INTESTINAL METAPLASIA IDENTIFIED - SEE COMMENT Microscopic Comment A Warthin-Starry stain is performed to determine the possibility of the presence of Helicobacter pylori. The Warthin-Starry stain is negative for organisms morphologically consistent with Helicobacter pylori.   What does this all mean? Even though the ulceration was improved endoscopically, there still remains some evidence of inflammation.  There is no H. pylori infection. There are no changes concerning for cancer or precancerous changes. Continue been doing well and we have been monitoring you relatively closely I would just plan to continue taking your acid reducing medication once daily at 40 mg. If by doing this, you have any worsening of symptoms and we will consider an earlier repeat evaluation of your upper GI tract. You had changes in your stomach similar to this over many years and we have imaging as well showing no significant changes other than some thickening in that region. I would like to see you back in clinic in approximately 2 months, at that time we will discuss what our surveillance will be considering a possible endoscopic ultrasound that may allow me to look a little bit deeper at the areas of the stomach that are thicker on your recent CT scan.   Please call us at Dept: [phone] if you have persistent problems or have questions about your condition that have not been fully answered at this time.  Sincerely,  Irving Copas., MD

## 2018-11-09 DIAGNOSIS — H43813 Vitreous degeneration, bilateral: Secondary | ICD-10-CM | POA: Diagnosis not present

## 2018-11-09 DIAGNOSIS — H35371 Puckering of macula, right eye: Secondary | ICD-10-CM | POA: Diagnosis not present

## 2018-11-10 NOTE — Progress Notes (Signed)
Subjective:    Patient ID: [name], female    DOB: 07/15/1932, 85 y.o.   MRN: [id number]  HPI The patient is here for an acute visit.   Right thumb:  Her right thumb is blue in color.  She denies ever having pain and there was no injury.  It started a few weeks ago she thinks with a discoloration on either side of the nail bed.  It was dark purple and 2 days ago it really caught her attention because of the purple discoloration had moved down the thumb and now on the 1 side of her thumb it is all purple.  She was not concerned until that occurred.    No pain, numbness/tingling, no increase warmth or cold.  She denies similar symptoms in the past.  No other finger seems to be affected.    Medications and allergies reviewed with patient and updated if appropriate.  Patient Active Problem List   Diagnosis Date Noted  . Discoloration of skin of finger 11/11/2018  . Chronic gastric ulcer without hemorrhage and without perforation 06/05/2018  . Constipation, chronic 06/05/2018  . Callus of foot 03/04/2018  . Hammertoes of both feet 03/04/2018  . PUD (peptic ulcer disease) 12/21/2017  . Acute blood loss anemia 12/21/2017  . Hypertension 12/21/2017  . Syncope 12/14/2017  . Macrocytic anemia 12/14/2017  . Near syncope 12/14/2017  . Left sided numbness 10/19/2017  . Small vessel disease, cerebrovascular 10/19/2017  . Numbness and tingling of left arm and leg 10/13/2017  . Anxiety 10/02/2017  . Right ear pain 09/04/2017  . Prediabetes 09/29/2016  . Lung nodule < 6cm on CT 09/29/2016  . Leg cramps 01/29/2016  . Insomnia 09/12/2015  . Rectal bleed 09/12/2015  . S/P AVR 11/16/2014  . Branch retinal vein occlusion 10/12/2013  . Aortic valve stenosis, severe   . Allergic rhinitis 09/10/2009  . Hyperlipidemia 11/30/2006  . MITRAL VALVE PROLAPSE 10/29/2006  . Osteopenia 10/29/2006  . History of right breast cancer 10/29/2006    Current Outpatient Medications on File  Prior to Visit  Medication Sig Dispense Refill  . acetaminophen (TYLENOL) 500 MG tablet Take 500-1,000 mg by mouth at bedtime as needed for mild pain or moderate pain.    Marland Kitchen ALPRAZolam (XANAX) 0.5 MG tablet TAKE ONE TABLET BY MOUTH DAILY AS NEEDED FOR ANXIETY *DO NOT TAKE AT NIGHT* 90 tablet 0  . Calcium Carb-Cholecalciferol (CALCIUM 600+D3) 600-400 MG-UNIT TABS Take 1 tablet by mouth daily.    . Chloral Hydrate CRYS Take 10 mLs by mouth at bedtime as needed (Call  liquid into [place]). 900 g 1  . Cholecalciferol (VITAMIN D-3) 1000 UNITS CAPS Take 1 capsule by mouth daily.     . clindamycin (CLEOCIN) 300 MG capsule Take 600 mg by mouth as directed. Take 600 mg (2 capsules) by mouth one hour prior to dental procedures    . Coenzyme Q10 (CO Q 10 PO) Take 1 capsule by mouth daily.    . fluticasone (FLONASE) 50 MCG/ACT nasal spray Place 2 sprays into both nostrils daily. 16 g 6  . levocetirizine (XYZAL) 5 MG tablet     . Magnesium 400 MG CAPS Take 400 mg by mouth every morning.     . montelukast (SINGULAIR) 10 MG tablet     . Ophthalmic Irrigation Solution (OCUSOFT EYE [place] OP) Apply 1 application to eye 2 (two) times daily as needed (dry eyes). Morning and before bed    .  pantoprazole (PROTONIX) 40 MG tablet Take 1 tablet (40 mg total) by mouth 2 (two) times daily. 180 tablet 2  . Polyethyl Glycol-Propyl Glycol (SYSTANE ULTRA PF) 0.4-0.3 % SOLN Apply 1 drop to eye 2 (two) times daily.    . rosuvastatin (CRESTOR) 10 MG tablet TAKE ONE TABLET BY MOUTH ONE TIME DAILY  90 tablet 1  . Wheat Dextrin (BENEFIBER DRINK MIX PO) Take 5 mLs by mouth every morning. Mix with 1/2 a glass water, and takes with vitamins     No current facility-administered medications on file prior to visit.     Past Medical History:  Diagnosis Date  . Allergy   . Anemia   . Anxiety   . Aortic valve stenosis, severe   . Breast cancer ([place]) 2007   right  . Cataract   . Hiatal hernia   .  Hypercholesteremia   . Hypoglycemia   . Osteopenia   . Personal history of radiation therapy   . Tingling   . Ulcer 2012   bleeding gastric Ulcer    Past Surgical History:  Procedure Laterality Date  . AORTIC VALVE REPLACEMENT N/A 11/16/2014   Procedure: AORTIC VALVE REPLACEMENT (AVR);  Surgeon: Gaye Pollack, MD;  Location: [facility];  Service: Open Heart Surgery;  Laterality: N/A;  . BIOPSY  12/15/2017   Procedure: BIOPSY;  Surgeon: Rush Landmark Telford Nab., MD;  Location: [facility];  Service: Gastroenterology;;  . BREAST BIOPSY    . BREAST LUMPECTOMY Right    2005?  Marland Kitchen BREAST SURGERY    . CARDIAC CATHETERIZATION N/A 10/18/2014   Procedure: Right/Left Heart Cath and Coronary Angiography;  Surgeon: [name], MD;  Location: [facility];  Service: Cardiovascular;  Laterality: N/A;  . COSMETIC SURGERY     face  . ESOPHAGOGASTRODUODENOSCOPY (EGD) WITH PROPOFOL N/A 12/15/2017   Procedure: ESOPHAGOGASTRODUODENOSCOPY (EGD) WITH PROPOFOL;  Surgeon: Rush Landmark Telford Nab., MD;  Location: [facility];  Service: Gastroenterology;  Laterality: N/A;  . PARATHYROIDECTOMY    . TEE WITHOUT CARDIOVERSION N/A 11/16/2014   Procedure: TRANSESOPHAGEAL ECHOCARDIOGRAM (TEE);  Surgeon: Gaye Pollack, MD;  Location: [facility];  Service: Open Heart Surgery;  Laterality: N/A;  . TONSILLECTOMY      Social History   Socioeconomic History  . Marital status: Married    Spouse name: Not on file  . Number of children: 3  . Years of education: 16  . Highest education level: Bachelor's degree (e.g., BA, AB, BS)  Occupational History  . Occupation: Retired  Scientific laboratory technician  . Financial resource strain: Not hard at all  . Food insecurity    Worry: Never true    Inability: Never true  . Transportation needs    Medical: No    Non-medical: No  Tobacco Use  . Smoking status: Former Smoker    Packs/day: 0.50    Years: 30.00    Pack years: 15.00    Types: Cigarettes  . Smokeless tobacco: Never Used   . Tobacco comment: quit smoking 1980  Substance and Sexual Activity  . Alcohol use: Yes    Alcohol/week: 7.0 standard drinks    Types: 7 Glasses of wine per week    Comment: wine nightly with dinner  . Drug use: No  . Sexual activity: Not on file  Lifestyle  . Physical activity    Days per week: 0 days    Minutes per session: 0 min  . Stress: Rather much  Relationships  . Social connections    Talks  on phone: More than three times a week    Gets together: More than three times a week    Attends religious service: Not on file    Active member of club or organization: Not on file    Attends meetings of clubs or organizations: Not on file    Relationship status: Married  Other Topics Concern  . Not on file  Social History Narrative   L[facility] with her husband.   Right-handed.   Caffeine use: 3-4 cups per day (some tea, mixes coffee with decaf).    Family History  Problem Relation Age of Onset  . Hypertension Mother   . Rectal cancer Mother   . Colon cancer Mother   . Kidney disease Father   . Esophageal cancer Neg Hx   . Stomach cancer Neg Hx   . Colon polyps Neg Hx     Review of Systems Per HPI    Objective:   Vitals:   11/11/18 1009  BP: 134/78  Pulse: (!) 59  Resp: 14  Temp: 97.6 F (36.4 C)  SpO2: 98%   BP Readings from Last 3 Encounters:  11/11/18 134/78  10/28/18 (!) 120/47  10/06/18 132/78   Wt Readings from Last 3 Encounters:  11/11/18 159 lb (72.1 kg)  10/28/18 154 lb (69.9 kg)  10/06/18 160 lb 6.4 oz (72.8 kg)   Body mass index is 26.46 kg/m.   Physical Exam Constitutional:      General: She is not in acute distress.    Appearance: Normal appearance. She is not ill-appearing.  Cardiovascular:     Comments: Normal right radial pulse Skin:    Comments: Right thumb with purple discoloration from DIP to nail bed and to a lesser degree to tip and down anterior side of thumb.  No pain, thumb is warm, normal sensation and ROM, no  swelling  Neurological:     Mental Status: She is alert.            Assessment & Plan:    See Problem List for Assessment and Plan of chronic medical problems.

## 2018-11-11 ENCOUNTER — Other Ambulatory Visit: Payer: Self-pay

## 2018-11-11 ENCOUNTER — Ambulatory Visit (INDEPENDENT_AMBULATORY_CARE_PROVIDER_SITE_OTHER): Payer: Medicare Other | Admitting: Internal Medicine

## 2018-11-11 ENCOUNTER — Encounter: Payer: Self-pay | Admitting: Internal Medicine

## 2018-11-11 DIAGNOSIS — L819 Disorder of pigmentation, unspecified: Secondary | ICD-10-CM | POA: Diagnosis not present

## 2018-11-11 NOTE — Assessment & Plan Note (Signed)
Right thumb with purple discoloration - no concerning symptoms, but has been going on for while and got worse over the past couple of days, which is more the concern She will monitor closely for change in temperature, numbness or pain and if she experiences any of these she will call immediately Will refer to vascular further input

## 2018-11-11 NOTE — Patient Instructions (Signed)
A referral was ordered vascular surgery - they will call you to make an appointment.    Monitor your thumb symptoms and if anything changes please call (coldness, pain or numbness).

## 2018-11-15 ENCOUNTER — Inpatient Hospital Stay: Payer: Medicare Other | Attending: Adult Health | Admitting: Adult Health

## 2018-11-15 ENCOUNTER — Encounter: Payer: Self-pay | Admitting: Adult Health

## 2018-11-15 ENCOUNTER — Telehealth: Payer: Self-pay | Admitting: Adult Health

## 2018-11-15 VITALS — BP 149/53 | HR 59 | Temp 98.2°F | Resp 17 | Ht 65.0 in | Wt 159.4 lb

## 2018-11-15 DIAGNOSIS — Z87891 Personal history of nicotine dependence: Secondary | ICD-10-CM

## 2018-11-15 DIAGNOSIS — Z923 Personal history of irradiation: Secondary | ICD-10-CM | POA: Diagnosis not present

## 2018-11-15 DIAGNOSIS — M8580 Other specified disorders of bone density and structure, unspecified site: Secondary | ICD-10-CM | POA: Diagnosis not present

## 2018-11-15 DIAGNOSIS — Z79899 Other long term (current) drug therapy: Secondary | ICD-10-CM | POA: Diagnosis not present

## 2018-11-15 DIAGNOSIS — Z9223 Personal history of estrogen therapy: Secondary | ICD-10-CM | POA: Diagnosis not present

## 2018-11-15 DIAGNOSIS — Z853 Personal history of malignant neoplasm of breast: Secondary | ICD-10-CM | POA: Diagnosis present

## 2018-11-15 NOTE — Progress Notes (Signed)
CLINIC:  Survivorship   REASON FOR VISIT:  Routine follow-up for history of breast cancer.   BRIEF ONCOLOGIC HISTORY:  Oncology History  History of right breast cancer  02/16/2004 Initial Diagnosis   Right breast mass biopsy: Invasive mammary cancer, MRI revealed 1.3 cm mass upper inner quadrant right breast   03/05/2004 Surgery   Right lumpectomy: 0.9 cm grade 1 invasive lobular cancer, ER positive, PR negative, HER-2 negative, Ki-67 5%, sentinel lymph nodes negative   04/08/2004 - 05/09/2004 Radiation Therapy   Adjuvant radiation therapy   06/10/2004 - 06/09/2009 Anti-estrogen oral therapy   MA-27 trial and placed on the Aromasin arm. She completed one year of Aromasin, and it was discontinued due to severe joint aches and pains, followed by Tamoxifen for 2-3 years, and then Letrozole to complete out a total of 5 years      INTERVAL HISTORY:  [name] presents to the [facility] today for routine follow-up for her history of breast cancer.  Overall, she reports feeling quite well.   Since her last visit, she underwent a bilateral breast screening mammogram on 02/26/2018 that showed no mammographic evidence of malignancy, and breast density category B.   Pamela continues to follow up with her PCP regularly.  She notes that she is exercising by walking throughout her house as it is currently too hot to walk out doors.  She is keeping appropriate pandemic precautions.    REVIEW OF SYSTEMS:  Review of Systems  Constitutional: Negative for appetite change, chills, fatigue, fever and unexpected weight change.  HENT:   Negative for hearing loss, lump/mass and sore throat.   Eyes: Negative for eye problems and icterus.  Respiratory: Negative for chest tightness, cough and shortness of breath.   Cardiovascular: Negative for chest pain, leg swelling and palpitations.  Gastrointestinal: Negative for abdominal distention, abdominal pain, constipation, diarrhea, nausea and  vomiting.  Endocrine: Negative for hot flashes.  Genitourinary: Negative for difficulty urinating.   Musculoskeletal: Negative for arthralgias.  Skin: Negative for itching and rash.  Neurological: Negative for dizziness, extremity weakness, headaches and numbness.  Hematological: Negative for adenopathy. Does not bruise/bleed easily.  Psychiatric/Behavioral: Negative for depression. The patient is not nervous/anxious.   Breast: Denies any new nodularity, masses, tenderness, nipple changes, or nipple discharge.       PAST MEDICAL/SURGICAL HISTORY:  Past Medical History:  Diagnosis Date  . Allergy   . Anemia   . Anxiety   . Aortic valve stenosis, severe   . Breast cancer ([place]) 2007   right  . Cataract   . Hiatal hernia   . Hypercholesteremia   . Hypoglycemia   . Osteopenia   . Personal history of radiation therapy   . Tingling   . Ulcer 2012   bleeding gastric Ulcer   Past Surgical History:  Procedure Laterality Date  . AORTIC VALVE REPLACEMENT N/A 11/16/2014   Procedure: AORTIC VALVE REPLACEMENT (AVR);  Surgeon: Gaye Pollack, MD;  Location: [facility];  Service: Open Heart Surgery;  Laterality: N/A;  . BIOPSY  12/15/2017   Procedure: BIOPSY;  Surgeon: Rush Landmark Telford Nab., MD;  Location: [facility];  Service: Gastroenterology;;  . BREAST BIOPSY    . BREAST LUMPECTOMY Right    2005?  Marland Kitchen BREAST SURGERY    . CARDIAC CATHETERIZATION N/A 10/18/2014   Procedure: Right/Left Heart Cath and Coronary Angiography;  Surgeon: [name], MD;  Location: [facility];  Service: Cardiovascular;  Laterality: N/A;  . COSMETIC SURGERY  face  . ESOPHAGOGASTRODUODENOSCOPY (EGD) WITH PROPOFOL N/A 12/15/2017   Procedure: ESOPHAGOGASTRODUODENOSCOPY (EGD) WITH PROPOFOL;  Surgeon: Rush Landmark Telford Nab., MD;  Location: [facility];  Service: Gastroenterology;  Laterality: N/A;  . PARATHYROIDECTOMY    . TEE WITHOUT CARDIOVERSION N/A 11/16/2014   Procedure: TRANSESOPHAGEAL  ECHOCARDIOGRAM (TEE);  Surgeon: Gaye Pollack, MD;  Location: [facility];  Service: Open Heart Surgery;  Laterality: N/A;  . TONSILLECTOMY       ALLERGIES:  Allergies  Allergen Reactions  . Amoxicillin-Pot Clavulanate Hives and Rash  . Penicillin G Rash  . Penicillins Hives and Rash  . Sulfa Antibiotics Rash  . Sulfamethoxazole Rash  . Sulfonamide Derivatives Hives     CURRENT MEDICATIONS:  Outpatient Encounter Medications as of 11/15/2018  Medication Sig  . acetaminophen (TYLENOL) 500 MG tablet Take 500-1,000 mg by mouth at bedtime as needed for mild pain or moderate pain.  Marland Kitchen ALPRAZolam (XANAX) 0.5 MG tablet TAKE ONE TABLET BY MOUTH DAILY AS NEEDED FOR ANXIETY *DO NOT TAKE AT NIGHT*  . Calcium Carb-Cholecalciferol (CALCIUM 600+D3) 600-400 MG-UNIT TABS Take 1 tablet by mouth daily.  . Chloral Hydrate CRYS Take 10 mLs by mouth at bedtime as needed (Call  liquid into [place]).  . Cholecalciferol (VITAMIN D-3) 1000 UNITS CAPS Take 1 capsule by mouth daily.   . clindamycin (CLEOCIN) 300 MG capsule Take 600 mg by mouth as directed. Take 600 mg (2 capsules) by mouth one hour prior to dental procedures  . Coenzyme Q10 (CO Q 10 PO) Take 1 capsule by mouth daily.  . fluticasone (FLONASE) 50 MCG/ACT nasal spray Place 2 sprays into both nostrils daily.  Marland Kitchen levocetirizine (XYZAL) 5 MG tablet   . Magnesium 400 MG CAPS Take 400 mg by mouth every morning.   . montelukast (SINGULAIR) 10 MG tablet   . Ophthalmic Irrigation Solution (OCUSOFT EYE [place] OP) Apply 1 application to eye 2 (two) times daily as needed (dry eyes). Morning and before bed  . pantoprazole (PROTONIX) 40 MG tablet Take 1 tablet (40 mg total) by mouth 2 (two) times daily.  Vladimir Faster Glycol-Propyl Glycol (SYSTANE ULTRA PF) 0.4-0.3 % SOLN Apply 1 drop to eye 2 (two) times daily.  . rosuvastatin (CRESTOR) 10 MG tablet TAKE ONE TABLET BY MOUTH ONE TIME DAILY   . Wheat Dextrin (BENEFIBER DRINK MIX PO) Take 5 mLs by  mouth every morning. Mix with 1/2 a glass water, and takes with vitamins   No facility-administered encounter medications on file as of 11/15/2018.      ONCOLOGIC FAMILY HISTORY:  Family History  Problem Relation Age of Onset  . Hypertension Mother   . Rectal cancer Mother   . Colon cancer Mother   . Kidney disease Father   . Esophageal cancer Neg Hx   . Stomach cancer Neg Hx   . Colon polyps Neg Hx     GENETIC COUNSELING/TESTING: Not at this time  SOCIAL HISTORY:  Social History   Socioeconomic History  . Marital status: Married    Spouse name: Not on file  . Number of children: 3  . Years of education: 16  . Highest education level: Bachelor's degree (e.g., BA, AB, BS)  Occupational History  . Occupation: Retired  Scientific laboratory technician  . Financial resource strain: Not hard at all  . Food insecurity    Worry: Never true    Inability: Never true  . Transportation needs    Medical: No    Non-medical: No  Tobacco Use  .  Smoking status: Former Smoker    Packs/day: 0.50    Years: 30.00    Pack years: 15.00    Types: Cigarettes  . Smokeless tobacco: Never Used  . Tobacco comment: quit smoking 1980  Substance and Sexual Activity  . Alcohol use: Yes    Alcohol/week: 7.0 standard drinks    Types: 7 Glasses of wine per week    Comment: wine nightly with dinner  . Drug use: No  . Sexual activity: Not on file  Lifestyle  . Physical activity    Days per week: 0 days    Minutes per session: 0 min  . Stress: Rather much  Relationships  . Social connections    Talks on phone: More than three times a week    Gets together: More than three times a week    Attends religious service: Not on file    Active member of club or organization: Not on file    Attends meetings of clubs or organizations: Not on file    Relationship status: Married  . Intimate partner violence    Fear of current or ex partner: No    Emotionally abused: No    Physically abused: No    Forced sexual  activity: No  Other Topics Concern  . Not on file  Social History Narrative   Lives at [facility] with her husband.   Right-handed.   Caffeine use: 3-4 cups per day (some tea, mixes coffee with decaf).     PHYSICAL EXAMINATION:  Vital Signs: Vitals:   11/15/18 1100  BP: (!) 149/53  Pulse: (!) 59  Resp: 17  Temp: 98.2 F (36.8 C)   Filed Weights   11/15/18 1100  Weight: 159 lb 6.4 oz (72.3 kg)   General: Well-nourished, well-appearing female in no acute distress.  Unaccompanied today.   HEENT: Head is normocephalic.  Pupils equal and reactive to light. Conjunctivae clear without exudate.  Sclerae anicteric. Oral mucosa is pink, moist.  Oropharynx is pink without lesions or erythema.  Lymph: No cervical, supraclavicular, or infraclavicular lymphadenopathy noted on palpation.  Cardiovascular: Regular rate and rhythm.Marland Kitchen Respiratory: Clear to auscultation bilaterally. Chest expansion symmetric; breathing non-labored.  Breast Exam:  -Left breast: No appreciable masses on palpation. No skin redness, thickening, or peau d'orange appearance; no nipple retraction or nipple discharge;   -Right breast: No appreciable masses on palpation. No skin redness, thickening, or peau d'orange appearance; no nipple retraction or nipple discharge; mild distortion in symmetry at previous lumpectomy site well healed scar without erythema or nodularity. -Axilla: No axillary adenopathy bilaterally.  GI: Abdomen soft and round; non-tender, non-distended. Bowel sounds normoactive. No hepatosplenomegaly.   GU: Deferred.  Neuro: No focal deficits. Steady gait.  Psych: Mood and affect normal and appropriate for situation.  MSK: No focal spinal tenderness to palpation, full range of motion in bilateral upper extremities Extremities: No edema. Skin: Warm and dry.  LABORATORY DATA:  None for this visit   DIAGNOSTIC IMAGING:  Most recent mammogram:     ASSESSMENT AND PLAN:  Ms.. Rosales is a pleasant  85 y.o. female with history of Stage IA right breast invasive lobular carcinoma, ER+/PR+/HER2-, diagnosed in 2005, treated with lumpectomy, adjuvant radiation therapy, and anti-estrogen therapy x 5 years completed in 2011.  [facility] for surveillance and routine follow-up.   1. History of breast cancer:  [name] is currently clinically and radiographically without evidence of disease or recurrence of breast cancer. She will be  due for mammogram in 02/2019.  She will return 6 months after her mammogram in 08/2018 for continued LTS follow up.  I encouraged her to call me with any questions or concerns before her next visit at the cancer center, and I would be happy to see her sooner, if needed.    2. Bone health:  Given [name] age, history of breast cancer, and her previous aromatase inhibitor use, she is at risk for bone demineralization. She last had her bone density in 08/2016 and it showed osteopenia, her PCP is following this.  She was given education on specific food and activities to promote bone health.  3. Cancer screening:  Due to [name] history and her age, she should receive screening for skin cancers/colon cancer. She was encouraged to follow-up with her PCP for appropriate cancer screenings.   4. Health maintenance and wellness promotion: [name] was encouraged to consume 5-7 servings of fruits and vegetables per day. She was also encouraged to engage in moderate to vigorous exercise for 30 minutes per day most days of the week. She was instructed to limit her alcohol consumption and continue to abstain from tobacco use.    Dispo:  -Return to cancer center in 08/2018 for LTS follow up -Mammogram in 02/2019   A total of (20) minutes of face-to-face time was spent with this patient with greater than 50% of that time in counseling and care-coordination.   Gardenia Phlegm, NP Survivorship Program [facility]  [phone]   Note: PRIMARY CARE PROVIDER [name], Page [phone]

## 2018-11-15 NOTE — Telephone Encounter (Signed)
I talk with patient regarding schedule  

## 2018-11-22 NOTE — Progress Notes (Addendum)
Subjective:   [name] is a 85 y.o. female who presents for Medicare Annual (Subsequent) preventive examination. I connected with patient by a telephone and verified that I am speaking with the correct person using two identifiers. Patient stated full name and DOB. Patient gave permission to continue with telephonic visit. Patient's location was at home and Nurse's location was at [place] office.   Review of Systems:   Cardiac Risk Factors include: advanced age (>74men, >54 women);dyslipidemia;hypertension  Sleep patterns: feels rested on waking and sleeps 6-7 hours nightly.    Home Safety/Smoke Alarms: Feels safe in home. Smoke alarms in place.  Living environment; residence and Firearm Safety: apartment. Resides at independent living at [facility]. Lives with husband, no needs for DME, good support system  Seat Belt Safety/Bike Helmet: Wears seat belt.     Objective:     Vitals: There were no vitals taken for this visit.  There is no height or weight on file to calculate BMI.  Advanced Directives 11/23/2018 12/15/2017 12/14/2017 10/02/2017 11/07/2015 02/21/2015 11/16/2014  Does Patient Have a Medical Advance Directive? Yes Yes Yes Yes Yes Yes Yes  Type of Paramedic of [place];Living will Healthcare Power of [place];Living will [place];Living will [place];Living will - [place];Living will  Does patient want to make changes to medical advance directive? - - No - Patient declined - - - -  Copy of [place] in Chart? No - copy requested No - copy requested No - copy requested No - copy requested No - copy requested Yes -  Would patient like information on creating a medical advance directive? - No - Patient declined No - Patient declined - - - -    Tobacco Social History   Tobacco Use  Smoking Status Former Smoker  . Packs/day: 0.50  .  Years: 30.00  . Pack years: 15.00  . Types: Cigarettes  Smokeless Tobacco Never Used  Tobacco Comment   quit smoking 1980     Counseling given: Not Answered Comment: quit smoking 1980  Past Medical History:  Diagnosis Date  . Allergy   . Anemia   . Anxiety   . Aortic valve stenosis, severe   . Breast cancer ([place]) 2007   right  . Cataract   . Hiatal hernia   . Hypercholesteremia   . Hypoglycemia   . Osteopenia   . Personal history of radiation therapy   . Tingling   . Ulcer 2012   bleeding gastric Ulcer   Past Surgical History:  Procedure Laterality Date  . AORTIC VALVE REPLACEMENT N/A 11/16/2014   Procedure: AORTIC VALVE REPLACEMENT (AVR);  Surgeon: Gaye Pollack, MD;  Location: [place];  Service: Open Heart Surgery;  Laterality: N/A;  . BIOPSY  12/15/2017   Procedure: BIOPSY;  Surgeon: Rush Landmark Telford Nab., MD;  Location: [place];  Service: Gastroenterology;;  . BREAST BIOPSY    . BREAST LUMPECTOMY Right    2005?  Marland Kitchen BREAST SURGERY    . CARDIAC CATHETERIZATION N/A 10/18/2014   Procedure: Right/Left Heart Cath and Coronary Angiography;  Surgeon: [name], MD;  Location: [place] CV LAB;  Service: Cardiovascular;  Laterality: N/A;  . COSMETIC SURGERY     face  . ESOPHAGOGASTRODUODENOSCOPY (EGD) WITH PROPOFOL N/A 12/15/2017   Procedure: ESOPHAGOGASTRODUODENOSCOPY (EGD) WITH PROPOFOL;  Surgeon: Rush Landmark Telford Nab., MD;  Location: [place];  Service: Gastroenterology;  Laterality: N/A;  .  PARATHYROIDECTOMY    . TEE WITHOUT CARDIOVERSION N/A 11/16/2014   Procedure: TRANSESOPHAGEAL ECHOCARDIOGRAM (TEE);  Surgeon: Gaye Pollack, MD;  Location: [place];  Service: Open Heart Surgery;  Laterality: N/A;  . TONSILLECTOMY     Family History  Problem Relation Age of Onset  . Hypertension Mother   . Rectal cancer Mother   . Colon cancer Mother   . Kidney disease Father   . Esophageal cancer Neg Hx   . Stomach cancer Neg Hx   . Colon polyps Neg Hx    Social  History   Socioeconomic History  . Marital status: Married    Spouse name: Not on file  . Number of children: 3  . Years of education: 16  . Highest education level: Bachelor's degree (e.g., BA, AB, BS)  Occupational History  . Occupation: Retired  Scientific laboratory technician  . Financial resource strain: Not hard at all  . Food insecurity    Worry: Never true    Inability: Never true  . Transportation needs    Medical: No    Non-medical: No  Tobacco Use  . Smoking status: Former Smoker    Packs/day: 0.50    Years: 30.00    Pack years: 15.00    Types: Cigarettes  . Smokeless tobacco: Never Used  . Tobacco comment: quit smoking 1980  Substance and Sexual Activity  . Alcohol use: Yes    Alcohol/week: 7.0 standard drinks    Types: 7 Glasses of Zyire Eidson per week    Comment: Neena Beecham nightly with dinner  . Drug use: No  . Sexual activity: Not Currently  Lifestyle  . Physical activity    Days per week: 0 days    Minutes per session: 0 min  . Stress: Rather much  Relationships  . Social connections    Talks on phone: More than three times a week    Gets together: More than three times a week    Attends religious service: Not on file    Active member of club or organization: Not on file    Attends meetings of clubs or organizations: Not on file    Relationship status: Married  Other Topics Concern  . Not on file  Social History Narrative   L[facility] at [facility] with her husband.   Right-handed.   Caffeine use: 3-4 cups per day (some tea, mixes coffee with decaf).    Outpatient Encounter Medications as of 11/23/2018  Medication Sig  . acetaminophen (TYLENOL) 500 MG tablet Take 500-1,000 mg by mouth at bedtime as needed for mild pain or moderate pain.  Marland Kitchen ALPRAZolam (XANAX) 0.5 MG tablet TAKE ONE TABLET BY MOUTH DAILY AS NEEDED FOR ANXIETY *DO NOT TAKE AT NIGHT*  . Calcium Carb-Cholecalciferol (CALCIUM 600+D3) 600-400 MG-UNIT TABS Take 1 tablet by mouth daily.  . Chloral Hydrate CRYS Take 10  mLs by mouth at bedtime as needed (Call  liquid into [place]).  . Cholecalciferol (VITAMIN D-3) 1000 UNITS CAPS Take 1 capsule by mouth daily.   . clindamycin (CLEOCIN) 300 MG capsule Take 600 mg by mouth as directed. Take 600 mg (2 capsules) by mouth one hour prior to dental procedures  . Coenzyme Q10 (CO Q 10 PO) Take 1 capsule by mouth daily.  . fluticasone (FLONASE) 50 MCG/ACT nasal spray Place 2 sprays into both nostrils daily.  Marland Kitchen levocetirizine (XYZAL) 5 MG tablet   . Magnesium 400 MG CAPS Take 400 mg by mouth every morning.   . montelukast (  SINGULAIR) 10 MG tablet   . Ophthalmic Irrigation Solution (OCUSOFT EYE [place] OP) Apply 1 application to eye 2 (two) times daily as needed (dry eyes). Morning and before bed  . pantoprazole (PROTONIX) 40 MG tablet Take 1 tablet (40 mg total) by mouth 2 (two) times daily.  Vladimir Faster Glycol-Propyl Glycol (SYSTANE ULTRA PF) 0.4-0.3 % SOLN Apply 1 drop to eye 2 (two) times daily.  . rosuvastatin (CRESTOR) 10 MG tablet TAKE ONE TABLET BY MOUTH ONE TIME DAILY   . Wheat Dextrin (BENEFIBER DRINK MIX PO) Take 5 mLs by mouth every morning. Mix with 1/2 a glass water, and takes with vitamins   No facility-administered encounter medications on file as of 11/23/2018.     Activities of Daily Living In your present state of health, do you have any difficulty performing the following activities: 11/23/2018 12/14/2017  Hearing? N N  Vision? N N  Difficulty concentrating or making decisions? N N  Walking or climbing stairs? N N  Dressing or bathing? N N  Doing errands, shopping? N N  Preparing Food and eating ? N -  Using the Toilet? N -  In the past six months, have you accidently leaked urine? N -  Do you have problems with loss of bowel control? N -  Managing your Medications? N -  Managing your Finances? N -  Housekeeping or managing your Housekeeping? N -  Some recent data might be hidden    Patient Care Team: [name], MD as  PCP - General (Internal Medicine) [name], MD as PCP - Cardiology (Cardiology) Pyrtle, Lajuan Lines, MD as Consulting Physician (Gastroenterology) [name], MD as Consulting Physician (Cardiology) Nicholas Lose, MD as Consulting Physician (Hematology and Oncology)    Assessment:   This is a routine wellness examination for [place]. Physical assessment deferred to PCP.   Exercise Activities and Dietary recommendations Current Exercise Habits: Home exercise routine, Type of exercise: walking, Time (Minutes): 30, Frequency (Times/Week): 5, Weekly Exercise (Minutes/Week): 150, Intensity: Mild, Exercise limited by: orthopedic condition(s)  Diet (meal preparation, eat out, water intake, caffeinated beverages, dairy products, fruits and vegetables): in general, a "healthy" diet  , well balanced   Reviewed heart healthy and diabetic diet. Encouraged patient to maintain daily water and healthy fluid intake.  Goals      Patient Stated   . patient (pt-stated)     Will stay active      Other   . Patient Stated     Stay as healthy and as independent as possible. Continue to stay socially active, go to a balance class at [facility], continue to walk and be active.       Fall Risk Fall Risk  11/23/2018 10/02/2017 09/29/2016 09/29/2016 02/21/2015  Falls in the past year? 0 No No No No  Number falls in past yr: 0 - - - -  Risk for fall due to : Impaired balance/gait - - - -  Follow up Falls prevention discussed - - - -   Depression Screen PHQ 2/9 Scores 11/23/2018 10/02/2017 09/29/2016 09/29/2016  PHQ - 2 Score 0 1 0 0  PHQ- 9 Score - 3 - -     Cognitive Function MMSE - Mini Mental State Exam 10/02/2017 02/21/2015  Not completed: - (No Data)  Orientation to time 5 -  Orientation to Place 5 -  Registration 3 -  Attention/ Calculation 5 -  Recall 2 -  Language- name 2 objects 2 -  Language- repeat 1 -  Language- follow 3 step command 3 -  Language- read & follow direction 1 -  Write a  sentence 1 -  Copy design 1 -  Total score 29 -       Ad8 score reviewed for issues:  Issues making decisions: no  Less interest in hobbies / activities: no  Repeats questions, stories (family complaining): no  Trouble using ordinary gadgets (microwave, computer, phone):no  Forgets the month or year: no  Mismanaging finances: no  Remembering appts: no  Daily problems with thinking and/or memory: no Ad8 score is= 0  Immunization History  Administered Date(s) Administered  . Influenza Split 01/29/2011, 02/10/2012  . Influenza Whole 01/27/2004, 02/02/2007, 02/08/2008, 02/09/2009, 01/16/2010  . Influenza, High Dose Seasonal PF 01/29/2016, 01/27/2017, 01/15/2018  . Influenza,inj,Quad PF,6+ Mos 01/18/2013, 01/26/2014, 01/23/2015  . Pneumococcal Conjugate-13 02/21/2015  . Pneumococcal Polysaccharide-23 04/28/2004  . Td 04/28/2005  . Tdap 09/29/2016  . Zoster 04/28/2009  . Zoster Recombinat (Shingrix) 10/30/2017   Screening Tests Health Maintenance  Topic Date Due  . DEXA SCAN  09/11/2018  . INFLUENZA VACCINE  11/27/2018  . MAMMOGRAM  02/27/2019  . TETANUS/TDAP  09/30/2026  . PNA vac Low Risk Adult  Completed      Plan:    Reviewed health maintenance screenings with patient today and relevant education, vaccines, and/or referrals were provided.   Continue to eat heart healthy diet (full of fruits, vegetables, whole grains, lean protein, water--limit salt, fat, and sugar intake) and increase physical activity as tolerated.  Continue doing brain stimulating activities (puzzles, reading, adult coloring books, staying active) to keep memory sharp.   I have personally reviewed and noted the following in the patient's chart:   . Medical and social history . Use of alcohol, tobacco or illicit drugs  . Current medications and supplements . Functional ability and status . Nutritional status . Physical activity . Advanced directives . List of other physicians .  Screenings to include cognitive, depression, and falls . Referrals and appointments  In addition, I have reviewed and discussed with patient certain preventive protocols, quality metrics, and best practice recommendations. A written personalized care plan for preventive services as well as general preventive health recommendations were provided to patient.     Michiel Cowboy, RN  11/23/2018   Medical screening examination/treatment/procedure(s) were performed by non-physician practitioner and as supervising physician I was immediately available for consultation/collaboration. I agree with above. [name], MD

## 2018-11-23 ENCOUNTER — Telehealth: Payer: Self-pay | Admitting: *Deleted

## 2018-11-23 ENCOUNTER — Ambulatory Visit (INDEPENDENT_AMBULATORY_CARE_PROVIDER_SITE_OTHER): Payer: Medicare Other | Admitting: *Deleted

## 2018-11-23 DIAGNOSIS — E782 Mixed hyperlipidemia: Secondary | ICD-10-CM

## 2018-11-23 DIAGNOSIS — Z0000 Encounter for general adult medical examination without abnormal findings: Secondary | ICD-10-CM | POA: Diagnosis not present

## 2018-11-23 DIAGNOSIS — Z780 Asymptomatic menopausal state: Secondary | ICD-10-CM

## 2018-11-23 DIAGNOSIS — R7303 Prediabetes: Secondary | ICD-10-CM

## 2018-11-23 DIAGNOSIS — I10 Essential (primary) hypertension: Secondary | ICD-10-CM

## 2018-11-23 DIAGNOSIS — D62 Acute posthemorrhagic anemia: Secondary | ICD-10-CM

## 2018-11-23 NOTE — Telephone Encounter (Signed)
Blood work ordered.

## 2018-11-23 NOTE — Telephone Encounter (Signed)
During AWV, patient stated she would like to have labs done before her scheduled visit on 12/23/18. She wants the PCP to have the lab results to be able to discuss during the upcoming visit.

## 2018-11-29 ENCOUNTER — Other Ambulatory Visit: Payer: Self-pay

## 2018-11-29 ENCOUNTER — Ambulatory Visit (INDEPENDENT_AMBULATORY_CARE_PROVIDER_SITE_OTHER): Payer: Medicare Other | Admitting: Podiatry

## 2018-11-29 ENCOUNTER — Encounter: Payer: Self-pay | Admitting: Podiatry

## 2018-11-29 ENCOUNTER — Ambulatory Visit (INDEPENDENT_AMBULATORY_CARE_PROVIDER_SITE_OTHER): Payer: Medicare Other

## 2018-11-29 VITALS — BP 135/66 | HR 57 | Temp 97.7°F

## 2018-11-29 DIAGNOSIS — L989 Disorder of the skin and subcutaneous tissue, unspecified: Secondary | ICD-10-CM | POA: Diagnosis not present

## 2018-11-29 NOTE — Patient Instructions (Signed)
Corn and Callus remover

## 2018-12-01 NOTE — Progress Notes (Signed)
   Subjective: 85 y.o. female presenting to the office today as a new patient with a chief complaint a mildly painful lesion noted to the plantar aspect of the left foot that appeared about 15 years ago. She reports the pain is located on the ball of the foot. She denies any modifying factors and has not had any treatment for the symptoms. Patient is here for further evaluation and treatment.   Past Medical History:  Diagnosis Date  . Allergy   . Anemia   . Anxiety   . Aortic valve stenosis, severe   . Breast cancer ([place]) 2007   right  . Cataract   . Hiatal hernia   . Hypercholesteremia   . Hypoglycemia   . Osteopenia   . Personal history of radiation therapy   . Tingling   . Ulcer 2012   bleeding gastric Ulcer     Objective:  Physical Exam General: Alert and oriented x3 in no acute distress  Dermatology: Hyperkeratotic lesion(s) present on the left foot. Pain on palpation with a central nucleated core noted. Skin is warm, dry and supple bilateral lower extremities. Negative for open lesions or macerations.  Vascular: Palpable pedal pulses bilaterally. No edema or erythema noted. Capillary refill within normal limits.  Neurological: Epicritic and protective threshold grossly intact bilaterally.   Musculoskeletal Exam: Pain on palpation at the keratotic lesion(s) noted. Range of motion within normal limits bilateral. Muscle strength 5/5 in all groups bilateral.  Assessment: 1. Porokeratosis left foot   Plan of Care:  1. Patient evaluated 2. Excisional debridement of keratoic lesion using a chisel blade was performed without incident. Salinocaine applied.  3. Dressed area with light dressing. 4. Recommended OTC corn and callus remover.  5. Patient is to return to the clinic PRN.   Edrick Kins, DPM [facility]  Dr. Edrick Kins, [address]                                        [place], [place] [address]                Office [phone]  Fax [phone]

## 2018-12-08 ENCOUNTER — Other Ambulatory Visit: Payer: Self-pay

## 2018-12-08 MED ORDER — ALPRAZOLAM 0.5 MG PO TABS: ORAL_TABLET | ORAL | 0 refills | Status: DC

## 2018-12-08 NOTE — Telephone Encounter (Signed)
Last RF was 09/16/18 Last OV 06/24/18 Next OV 12/23/18

## 2018-12-14 ENCOUNTER — Ambulatory Visit: Payer: Medicare Other | Admitting: Gastroenterology

## 2018-12-20 ENCOUNTER — Ambulatory Visit (INDEPENDENT_AMBULATORY_CARE_PROVIDER_SITE_OTHER)
Admission: RE | Admit: 2018-12-20 | Discharge: 2018-12-20 | Disposition: A | Discharge status: Home / Self Care | Payer: Medicare Other | Source: Ambulatory Visit

## 2018-12-20 ENCOUNTER — Other Ambulatory Visit: Payer: Self-pay

## 2018-12-20 ENCOUNTER — Other Ambulatory Visit (INDEPENDENT_AMBULATORY_CARE_PROVIDER_SITE_OTHER): Payer: Medicare Other

## 2018-12-20 DIAGNOSIS — D62 Acute posthemorrhagic anemia: Secondary | ICD-10-CM | POA: Diagnosis not present

## 2018-12-20 DIAGNOSIS — E782 Mixed hyperlipidemia: Secondary | ICD-10-CM | POA: Diagnosis not present

## 2018-12-20 DIAGNOSIS — I10 Essential (primary) hypertension: Secondary | ICD-10-CM | POA: Diagnosis not present

## 2018-12-20 DIAGNOSIS — Z780 Asymptomatic menopausal state: Secondary | ICD-10-CM

## 2018-12-20 DIAGNOSIS — R7303 Prediabetes: Secondary | ICD-10-CM | POA: Diagnosis not present

## 2018-12-20 LAB — CBC WITH DIFFERENTIAL/PLATELET
Basophils Absolute: 0.1 10*3/uL (ref 0.0–0.1)
Basophils Relative: 0.9 % (ref 0.0–3.0)
Eosinophils Absolute: 0.1 10*3/uL (ref 0.0–0.7)
Eosinophils Relative: 0.9 % (ref 0.0–5.0)
HCT: 36.5 % (ref 36.0–46.0)
Hemoglobin: 11.8 g/dL — ABNORMAL LOW (ref 12.0–15.0)
Lymphocytes Relative: 35.9 % (ref 12.0–46.0)
Lymphs Abs: 2.3 10*3/uL (ref 0.7–4.0)
MCHC: 32.3 g/dL (ref 30.0–36.0)
MCV: 85.1 fl (ref 78.0–100.0)
Monocytes Absolute: 0.6 10*3/uL (ref 0.1–1.0)
Monocytes Relative: 9.6 % (ref 3.0–12.0)
Neutro Abs: 3.4 10*3/uL (ref 1.4–7.7)
Neutrophils Relative %: 52.7 % (ref 43.0–77.0)
Platelets: 172 10*3/uL (ref 150.0–400.0)
RBC: 4.28 Mil/uL (ref 3.87–5.11)
RDW: 15.4 % (ref 11.5–15.5)
WBC: 6.4 10*3/uL (ref 4.0–10.5)

## 2018-12-20 LAB — LIPID PANEL
Cholesterol: 216 mg/dL — ABNORMAL HIGH (ref 0–200)
HDL: 97.7 mg/dL (ref >39.00)
LDL Cholesterol: 107 mg/dL — ABNORMAL HIGH (ref 0–99)
NonHDL: 118.42
Total CHOL/HDL Ratio: 2
Triglycerides: 58 mg/dL (ref 0.0–149.0)
VLDL: 11.6 mg/dL (ref 0.0–40.0)

## 2018-12-20 LAB — COMPREHENSIVE METABOLIC PANEL
ALT: 23 U/L (ref 0–35)
AST: 28 U/L (ref 0–37)
Albumin: 4.2 g/dL (ref 3.5–5.2)
Alkaline Phosphatase: 118 U/L — ABNORMAL HIGH (ref 39–117)
BUN: 10 mg/dL (ref 6–23)
CO2: 27 mEq/L (ref 19–32)
Calcium: 9 mg/dL (ref 8.4–10.5)
Chloride: 102 mEq/L (ref 96–112)
Creatinine, Ser: 0.74 mg/dL (ref 0.40–1.20)
GFR: 74.8 mL/min (ref >60.00)
Glucose, Bld: 85 mg/dL (ref 70–99)
Potassium: 4 mEq/L (ref 3.5–5.1)
Sodium: 137 mEq/L (ref 135–145)
Total Bilirubin: 0.5 mg/dL (ref 0.2–1.2)
Total Protein: 6.3 g/dL (ref 6.0–8.3)

## 2018-12-20 LAB — HEMOGLOBIN A1C: Hgb A1c MFr Bld: 5.8 % (ref 4.6–6.5)

## 2018-12-21 LAB — IRON,TIBC AND FERRITIN PANEL
%SAT: 14 % (calc) — ABNORMAL LOW (ref 16–45)
Ferritin: 13 ng/mL — ABNORMAL LOW (ref 16–288)
Iron: 51 ug/dL (ref 45–160)
TIBC: 371 mcg/dL (calc) (ref 250–450)

## 2018-12-22 NOTE — Patient Instructions (Addendum)
  All other Health Maintenance issues reviewed.   All recommended immunizations and age-appropriate screenings are up-to-date or discussed.  Flu immunization administered today.    Medications reviewed and updated.  Changes include :  none      Please followup in 6 months

## 2018-12-22 NOTE — Progress Notes (Signed)
Subjective:    Patient ID: [name], female    DOB: [birth year], 85 y.o.   MRN: [id number]  HPI The patient is here for follow up.  She is exercising regularly - walking.    Hyperlipidemia: She is taking her medication daily. She is compliant with a low fat/cholesterol diet. She denies myalgias.   Prediabetes:  She is fairly compliant with a low sugar/carbohydrate diet.  She is exercising regularly.  Anxiety: She is taking her Xanax as needed as prescribed. She denies any side effects from the medication. She feels her anxiety is well controlled and she is happy with her current dose of medication.   Insomnia:  She takes her chloral hydrate nightly.  She has been on this for years and her sleep is good with it.  She recently stopped drinking decaf iced coffee in the afternoon and that has improved her sleep.  Osteopenia, high frax:  She walks regularly.  She takes calcium and vitamin d daily. She just had her dexa done this week-the results are still pending.    H/o PUD:  Taking her protonix twice daily.  She had a recent endoscopy to evaluate for her ulcers.  She denies any GERD.  B12:  She is taking B12 twice daily.      Medications and allergies reviewed with patient and updated if appropriate.  Patient Active Problem List   Diagnosis Date Noted  . Discoloration of skin of finger 11/11/2018  . Chronic gastric ulcer without hemorrhage and without perforation 06/05/2018  . Constipation, chronic 06/05/2018  . Callus of foot 03/04/2018  . Hammertoes of both feet 03/04/2018  . PUD (peptic ulcer disease) 12/21/2017  . Acute blood loss anemia 12/21/2017  . Syncope 12/14/2017  . Macrocytic anemia 12/14/2017  . Near syncope 12/14/2017  . Left sided numbness 10/19/2017  . Small vessel disease, cerebrovascular 10/19/2017  . Numbness and tingling of left arm and leg 10/13/2017  . Anxiety 10/02/2017  . Right ear pain 09/04/2017  . Prediabetes 09/29/2016  . Lung  nodule < 6cm on CT 09/29/2016  . Leg cramps 01/29/2016  . Insomnia 09/12/2015  . Rectal bleed 09/12/2015  . S/P AVR 11/16/2014  . Branch retinal vein occlusion 10/12/2013  . Aortic valve stenosis, severe   . Allergic rhinitis 09/10/2009  . Hyperlipidemia 11/30/2006  . MITRAL VALVE PROLAPSE 10/29/2006  . Osteopenia 10/29/2006  . History of right breast cancer 10/29/2006    Current Outpatient Medications on File Prior to Visit  Medication Sig Dispense Refill  . acetaminophen (TYLENOL) 500 MG tablet Take 500-1,000 mg by mouth at bedtime as needed for mild pain or moderate pain.    Marland Kitchen ALPRAZolam (XANAX) 0.5 MG tablet TAKE ONE TABLET BY MOUTH DAILY AS NEEDED FOR ANXIETY *DO NOT TAKE AT NIGHT* 90 tablet 0  . Calcium Carb-Cholecalciferol (CALCIUM 600+D3) 600-400 MG-UNIT TABS Take 1 tablet by mouth daily.    . Chloral Hydrate CRYS Take 10 mLs by mouth at bedtime as needed (Call  liquid into [place]). 900 g 1  . Cholecalciferol (VITAMIN D-3) 1000 UNITS CAPS Take 1 capsule by mouth daily.     . clindamycin (CLEOCIN) 300 MG capsule Take 600 mg by mouth as directed. Take 600 mg (2 capsules) by mouth one hour prior to dental procedures    . Coenzyme Q10 (CO Q 10 PO) Take 1 capsule by mouth daily.    . fluticasone (FLONASE) 50 MCG/ACT nasal spray Place 2 sprays into both  nostrils daily. 16 g 6  . levocetirizine (XYZAL) 5 MG tablet     . Magnesium 400 MG CAPS Take 400 mg by mouth every morning.     . montelukast (SINGULAIR) 10 MG tablet     . Ophthalmic Irrigation Solution (OCUSOFT EYE [place] OP) Apply 1 application to eye 2 (two) times daily as needed (dry eyes). Morning and before bed    . pantoprazole (PROTONIX) 40 MG tablet Take 1 tablet (40 mg total) by mouth 2 (two) times daily. 180 tablet 2  . Polyethyl Glycol-Propyl Glycol (SYSTANE ULTRA PF) 0.4-0.3 % SOLN Apply 1 drop to eye 2 (two) times daily.    . rosuvastatin (CRESTOR) 10 MG tablet TAKE ONE TABLET BY MOUTH ONE TIME DAILY   90 tablet 1  . Wheat Dextrin (BENEFIBER DRINK MIX PO) Take 5 mLs by mouth every morning. Mix with 1/2 a glass water, and takes with vitamins     No current facility-administered medications on file prior to visit.     Past Medical History:  Diagnosis Date  . Allergy   . Anemia   . Anxiety   . Aortic valve stenosis, severe   . Breast cancer ([place]) 2007   right  . Cataract   . Hiatal hernia   . Hypercholesteremia   . Hypoglycemia   . Osteopenia   . Personal history of radiation therapy   . Tingling   . Ulcer 2012   bleeding gastric Ulcer    Past Surgical History:  Procedure Laterality Date  . AORTIC VALVE REPLACEMENT N/A 11/16/2014   Procedure: AORTIC VALVE REPLACEMENT (AVR);  Surgeon: Gaye Pollack, MD;  Location: [place];  Service: Open Heart Surgery;  Laterality: N/A;  . BIOPSY  12/15/2017   Procedure: BIOPSY;  Surgeon: Rush Landmark Telford Nab., MD;  Location: [place];  Service: Gastroenterology;;  . BREAST BIOPSY    . BREAST LUMPECTOMY Right    2005?  Marland Kitchen BREAST SURGERY    . CARDIAC CATHETERIZATION N/A 10/18/2014   Procedure: Right/Left Heart Cath and Coronary Angiography;  Surgeon: [name], MD;  Location: [place] CV LAB;  Service: Cardiovascular;  Laterality: N/A;  . COSMETIC SURGERY     face  . ESOPHAGOGASTRODUODENOSCOPY (EGD) WITH PROPOFOL N/A 12/15/2017   Procedure: ESOPHAGOGASTRODUODENOSCOPY (EGD) WITH PROPOFOL;  Surgeon: Rush Landmark Telford Nab., MD;  Location: [place];  Service: Gastroenterology;  Laterality: N/A;  . PARATHYROIDECTOMY    . TEE WITHOUT CARDIOVERSION N/A 11/16/2014   Procedure: TRANSESOPHAGEAL ECHOCARDIOGRAM (TEE);  Surgeon: Gaye Pollack, MD;  Location: [place];  Service: Open Heart Surgery;  Laterality: N/A;  . TONSILLECTOMY      Social History   Socioeconomic History  . Marital status: Married    Spouse name: Not on file  . Number of children: 3  . Years of education: 16  . Highest education level: Bachelor's degree (e.g., BA,  AB, BS)  Occupational History  . Occupation: Retired  Scientific laboratory technician  . Financial resource strain: Not hard at all  . Food insecurity    Worry: Never true    Inability: Never true  . Transportation needs    Medical: No    Non-medical: No  Tobacco Use  . Smoking status: Former Smoker    Packs/day: 0.50    Years: 30.00    Pack years: 15.00    Types: Cigarettes  . Smokeless tobacco: Never Used  . Tobacco comment: quit smoking 1980  Substance and Sexual Activity  . Alcohol use: Yes  Alcohol/week: 7.0 standard drinks    Types: 7 Glasses of wine per week    Comment: wine nightly with dinner  . Drug use: No  . Sexual activity: Not Currently  Lifestyle  . Physical activity    Days per week: 6 days    Minutes per session: 30 min  . Stress: To some extent  Relationships  . Social connections    Talks on phone: More than three times a week    Gets together: More than three times a week    Attends religious service: Not on file    Active member of club or organization: Yes    Attends meetings of clubs or organizations: More than 4 times per year    Relationship status: Married  Other Topics Concern  . Not on file  Social History Narrative   Lives at [facility] with her husband.   Right-handed.   Caffeine use: 3-4 cups per day (some tea, mixes coffee with decaf).    Family History  Problem Relation Age of Onset  . Hypertension Mother   . Rectal cancer Mother   . Colon cancer Mother   . Kidney disease Father   . Esophageal cancer Neg Hx   . Stomach cancer Neg Hx   . Colon polyps Neg Hx     Review of Systems  Constitutional: Negative for chills and fever.  Respiratory: Negative for cough, shortness of breath and wheezing.   Cardiovascular: Negative for chest pain, palpitations and leg swelling.  Neurological: Negative for light-headedness and headaches.       Objective:   Vitals:   12/23/18 1041  BP: 138/82  Pulse: (!) 59  Temp: (!) 97.5 F (36.4 C)  SpO2:  97%   BP Readings from Last 3 Encounters:  12/23/18 138/82  11/29/18 135/66  11/15/18 (!) 149/53   Wt Readings from Last 3 Encounters:  12/23/18 160 lb 3.2 oz (72.7 kg)  11/15/18 159 lb 6.4 oz (72.3 kg)  11/11/18 159 lb (72.1 kg)   Body mass index is 26.66 kg/m.   Physical Exam    Constitutional: Appears well-developed and well-nourished. No distress.  HENT:  Head: Normocephalic and atraumatic.  Neck: Neck supple. No tracheal deviation present. No thyromegaly present.  No cervical lymphadenopathy Cardiovascular: Normal rate, regular rhythm and normal heart sounds.   2/6 systolic murmur heard. No carotid bruit .  No edema Pulmonary/Chest: Effort normal and breath sounds normal. No respiratory distress. No has no wheezes. No rales.  Skin: Skin is warm and dry. Not diaphoretic.  Psychiatric: Normal mood and affect. Behavior is normal.      Assessment & Plan:    See Problem List for Assessment and Plan of chronic medical problems.

## 2018-12-23 ENCOUNTER — Other Ambulatory Visit: Payer: Self-pay

## 2018-12-23 ENCOUNTER — Ambulatory Visit (INDEPENDENT_AMBULATORY_CARE_PROVIDER_SITE_OTHER): Payer: Medicare Other | Admitting: Internal Medicine

## 2018-12-23 ENCOUNTER — Encounter: Payer: Self-pay | Admitting: Internal Medicine

## 2018-12-23 VITALS — BP 138/82 | HR 59 | Temp 97.5°F | Ht 65.0 in | Wt 160.2 lb

## 2018-12-23 DIAGNOSIS — F419 Anxiety disorder, unspecified: Secondary | ICD-10-CM | POA: Diagnosis not present

## 2018-12-23 DIAGNOSIS — E538 Deficiency of other specified B group vitamins: Secondary | ICD-10-CM | POA: Insufficient documentation

## 2018-12-23 DIAGNOSIS — R7303 Prediabetes: Secondary | ICD-10-CM | POA: Diagnosis not present

## 2018-12-23 DIAGNOSIS — E782 Mixed hyperlipidemia: Secondary | ICD-10-CM

## 2018-12-23 DIAGNOSIS — Z23 Encounter for immunization: Secondary | ICD-10-CM | POA: Diagnosis not present

## 2018-12-23 DIAGNOSIS — F5101 Primary insomnia: Secondary | ICD-10-CM

## 2018-12-23 DIAGNOSIS — M85869 Other specified disorders of bone density and structure, unspecified lower leg: Secondary | ICD-10-CM

## 2018-12-23 NOTE — Assessment & Plan Note (Addendum)
Taking B12 twice daily-continue Will recheck of level at her next appointment

## 2018-12-23 NOTE — Assessment & Plan Note (Addendum)
Lab Results  Component Value Date   HGBA1C 5.8 12/20/2018   Continue walking She is fairly compliant with a low sugar/carbohydrate diet Follow-up in 6 months

## 2018-12-23 NOTE — Assessment & Plan Note (Signed)
Lipids well controlled Continue Crestor at current dose

## 2018-12-23 NOTE — Assessment & Plan Note (Signed)
Well-controlled Continue her current medication nightly-she has been on it for years and denies side effects

## 2018-12-23 NOTE — Assessment & Plan Note (Signed)
Well-controlled with alprazolam No side effects We will continue

## 2018-12-23 NOTE — Assessment & Plan Note (Signed)
DEXA just done this week-results pending She is walking regularly and will continue Continue calcium and vitamin D

## 2018-12-24 ENCOUNTER — Encounter: Payer: Self-pay | Admitting: Gastroenterology

## 2018-12-24 ENCOUNTER — Ambulatory Visit (INDEPENDENT_AMBULATORY_CARE_PROVIDER_SITE_OTHER): Payer: Medicare Other | Admitting: Gastroenterology

## 2018-12-24 VITALS — BP 100/80 | Temp 97.5°F | Ht 65.0 in | Wt 160.0 lb

## 2018-12-24 DIAGNOSIS — K257 Chronic gastric ulcer without hemorrhage or perforation: Secondary | ICD-10-CM | POA: Diagnosis not present

## 2018-12-24 DIAGNOSIS — F419 Anxiety disorder, unspecified: Secondary | ICD-10-CM | POA: Diagnosis not present

## 2018-12-24 DIAGNOSIS — E538 Deficiency of other specified B group vitamins: Secondary | ICD-10-CM

## 2018-12-24 DIAGNOSIS — E611 Iron deficiency: Secondary | ICD-10-CM | POA: Diagnosis not present

## 2018-12-24 DIAGNOSIS — D649 Anemia, unspecified: Secondary | ICD-10-CM | POA: Diagnosis not present

## 2018-12-24 MED ORDER — PANTOPRAZOLE SODIUM 40 MG PO TBEC: 40.0000 mg | DELAYED_RELEASE_TABLET | Freq: Two times a day (BID) | ORAL | 4 refills | Status: DC

## 2018-12-24 NOTE — Progress Notes (Signed)
[place] VISIT   Primary Care Provider [name], MD [place] [address] [phone]  Patient Profile: [name] is a 85 y.o. female with a pmh significant for Chronic Gastric Ulceration, prior breast cancer, hypertension scratch that hyperlipidemia, status post AVR (bioprosthetic), osteopenia, anxiety.  The patient presents to the [facility] for an evaluation and management of problem(s) noted below:  Problem List 1. Chronic gastric ulcer without hemorrhage and without perforation   2. Anxiety   3. Iron deficiency   4. B12 deficiency   5. Anemia, unspecified type     History of Present Illness Please see prior notation from the hospital as well as PA Lemmon and my progress note for full details of HPI.  Interval History The patient presents today doing well.  She has not had any concerns in regards to her GI tract other than the last couple of days where she has been experiencing increased anxiety of issues that surround her and her husband.  Today, she is doing well.  The patient relays that she continues to take her twice daily PPI and feels well with this.  She is really wanting to try and minimize any further procedures if possible.  The patient expresses to me a significant concern whether stress and anxiety are causing her to have these persistent signs and symptoms.  There is no doubt as she deals with having to take care of her aging husband (6 years old) who is having some C now memory loss as well as issues that surround aggravations that occur at home that this has caused stress for her over many years.  She has discussed this with her primary care doctor as well as her daughters.  She wonders about how much she can continue doing in regards to being a caretaker for the person that she loves and wants to continue to be with but as it causes her stress and anxiety she worries about her own health.   Otherwise no significant changes in bowel habits.  GI Review of Systems Positive as above Negative for dysphagia, pyrosis, odynophagia, vomiting, nausea, weight loss, melena, hematochezia  Review of Systems General: Denies fevers/chills HEENT: Denies oral lesions Cardiovascular: Denies chest pain Pulmonary: Denies shortness of breath Gastroenterological: See HPI Genitourinary: Denies darkened urine Hematological: Denies easy bruising Dermatological: Denies jaundice Psychological: Mood is stable though she relays issues that surround anxiety and stress   Medications Current Outpatient Medications  Medication Sig Dispense Refill  . acetaminophen (TYLENOL) 500 MG tablet Take 500-1,000 mg by mouth at bedtime as needed for mild pain or moderate pain.    Marland Kitchen ALPRAZolam (XANAX) 0.5 MG tablet TAKE ONE TABLET BY MOUTH DAILY AS NEEDED FOR ANXIETY *DO NOT TAKE AT NIGHT* 90 tablet 0  . Calcium Carb-Cholecalciferol (CALCIUM 600+D3) 600-400 MG-UNIT TABS Take 1 tablet by mouth daily.    . Chloral Hydrate CRYS Take 10 mLs by mouth at bedtime as needed (Call  liquid into [place]). 900 g 1  . Cholecalciferol (VITAMIN D-3) 1000 UNITS CAPS Take 1 capsule by mouth daily.     . clindamycin (CLEOCIN) 300 MG capsule Take 600 mg by mouth as directed. Take 600 mg (2 capsules) by mouth one hour prior to dental procedures    . Coenzyme Q10 (CO Q 10 PO) Take 1 capsule by mouth daily.    . fluticasone (FLONASE) 50 MCG/ACT nasal spray Place 2 sprays into both nostrils daily. [place]  g 6  . levocetirizine (XYZAL) 5 MG tablet     . Magnesium 400 MG CAPS Take 400 mg by mouth every morning.     . montelukast (SINGULAIR) 10 MG tablet     . Ophthalmic Irrigation Solution (OCUSOFT EYE [place] OP) Apply 1 application to eye 2 (two) times daily as needed (dry eyes). Morning and before bed    . pantoprazole (PROTONIX) 40 MG tablet Take 1 tablet (40 mg total) by mouth 2 (two) times daily. 180 tablet 4  . Polyethyl  Glycol-Propyl Glycol (SYSTANE ULTRA PF) 0.4-0.3 % SOLN Apply 1 drop to eye 2 (two) times daily.    . rosuvastatin (CRESTOR) 10 MG tablet TAKE ONE TABLET BY MOUTH ONE TIME DAILY  90 tablet 1  . Wheat Dextrin (BENEFIBER DRINK MIX PO) Take 5 mLs by mouth every morning. Mix with 1/2 a glass water, and takes with vitamins     No current facility-administered medications for this visit.     Allergies Allergies  Allergen Reactions  . Amoxicillin-Pot Clavulanate Hives and Rash  . Penicillin G Rash  . Penicillins Hives and Rash  . Sulfa Antibiotics Rash  . Sulfamethoxazole Rash  . Sulfonamide Derivatives Hives    Histories Past Medical History:  Diagnosis Date  . Allergy   . Anemia   . Anxiety   . Aortic valve stenosis, severe   . Breast cancer ([place]) 2007   right  . Cataract   . Hiatal hernia   . Hypercholesteremia   . Hypoglycemia   . Osteopenia   . Personal history of radiation therapy   . Tingling   . Ulcer 2012   bleeding gastric Ulcer   Past Surgical History:  Procedure Laterality Date  . AORTIC VALVE REPLACEMENT N/A 11/16/2014   Procedure: AORTIC VALVE REPLACEMENT (AVR);  Surgeon: Gaye Pollack, MD;  Location: [facility];  Service: Open Heart Surgery;  Laterality: N/A;  . BIOPSY  12/15/2017   Procedure: BIOPSY;  Surgeon: Rush Landmark Telford Nab., MD;  Location: [facility];  Service: Gastroenterology;;  . BREAST BIOPSY    . BREAST LUMPECTOMY Right    2005?  Marland Kitchen BREAST SURGERY    . CARDIAC CATHETERIZATION N/A 10/18/2014   Procedure: Right/Left Heart Cath and Coronary Angiography;  Surgeon: [name], MD;  Location: [facility] [facility] CV LAB;  Service: Cardiovascular;  Laterality: N/A;  . COSMETIC SURGERY     face  . ESOPHAGOGASTRODUODENOSCOPY (EGD) WITH PROPOFOL N/A 12/15/2017   Procedure: ESOPHAGOGASTRODUODENOSCOPY (EGD) WITH PROPOFOL;  Surgeon: Rush Landmark Telford Nab., MD;  Location: [facility];  Service: Gastroenterology;  Laterality: N/A;  . PARATHYROIDECTOMY    . TEE  WITHOUT CARDIOVERSION N/A 11/16/2014   Procedure: TRANSESOPHAGEAL ECHOCARDIOGRAM (TEE);  Surgeon: Gaye Pollack, MD;  Location: [facility];  Service: Open Heart Surgery;  Laterality: N/A;  . TONSILLECTOMY     Social History   Socioeconomic History  . Marital status: Married    Spouse name: Not on file  . Number of children: 3  . Years of education: 16  . Highest education level: Bachelor's degree (e.g., BA, AB, BS)  Occupational History  . Occupation: Retired  Scientific laboratory technician  . Financial resource strain: Not hard at all  . Food insecurity    Worry: Never true    Inability: Never true  . Transportation needs    Medical: No    Non-medical: No  Tobacco Use  . Smoking status: Former Smoker    Packs/day: 0.50    Years: 30.00  Pack years: 15.00    Types: Cigarettes  . Smokeless tobacco: Never Used  . Tobacco comment: quit smoking 1980  Substance and Sexual Activity  . Alcohol use: Yes    Alcohol/week: 7.0 standard drinks    Types: 7 Glasses of wine per week    Comment: wine nightly with dinner  . Drug use: No  . Sexual activity: Not Currently  Lifestyle  . Physical activity    Days per week: 6 days    Minutes per session: 30 min  . Stress: To some extent  Relationships  . Social connections    Talks on phone: More than three times a week    Gets together: More than three times a week    Attends religious service: Not on file    Active member of club or organization: Yes    Attends meetings of clubs or organizations: More than 4 times per year    Relationship status: Married  . Intimate partner violence    Fear of current or ex partner: No    Emotionally abused: No    Physically abused: No    Forced sexual activity: No  Other Topics Concern  . Not on file  Social History Narrative   L[facility] with her husband.   Right-handed.   Caffeine use: 3-4 cups per day (some tea, mixes coffee with decaf).   Family History  Problem Relation Age of Onset  .  Hypertension Mother   . Rectal cancer Mother   . Colon cancer Mother   . Kidney disease Father   . Esophageal cancer Neg Hx   . Stomach cancer Neg Hx   . Colon polyps Neg Hx    I have reviewed her medical, social, and family history in detail and updated the electronic medical record as necessary.    PHYSICAL EXAMINATION  BP 100/80   Temp (!) 97.5 F (36.4 C)   Ht 5\' 5"  (1.651 m)   Wt 160 lb (72.6 kg)   BMI 26.63 kg/m  Wt Readings from Last 3 Encounters:  12/24/18 160 lb (72.6 kg)  12/23/18 160 lb 3.2 oz (72.7 kg)  11/15/18 159 lb 6.4 oz (72.3 kg)  GEN: NAD, appears stated age, doesn't appear chronically ill PSYCH: Cooperative, without pressured speech EYE: Conjunctivae pink, sclerae anicteric ENT: MMM CV: RR without R/Gs  RESP: CTAB posteriorly, without wheezing GI: NABS, soft, NT/ND, without rebound or guarding, no HSM appreciated MSK/EXT: No lower extremity edema SKIN: No jaundice NEURO:  Alert & Oriented x 3, no focal deficits   REVIEW OF DATA  I reviewed the following data at the time of this encounter:  GI Procedures and Studies  July 2020 EGD - No gross lesions in esophagus. - Small hiatal hernia. - Erythematous mucosa in the anterior wall of the gastric antrum and lesser curvature of the gastric antrum. Biopsied. - No other gross lesions in the stomach. - No gross lesions in the duodenal bulb, in the first portion of the duodenum and in the second portion of the duodenum.  Laboratory Studies  Reviewed in epic  Imaging Studies  January 2020 CT abdomen pelvis with contrast IMPRESSION: 1. Stomach is collapsed, limiting evaluation. Apparent wall thickening in the gastric antrum without discrete gastric mass, compatible with the provided history of gastric ulcer disease. 2. No free air or focal fluid collections to suggest perforation. 3. No evidence of metastatic disease in the abdomen or pelvis. 4.  Aortic Atherosclerosis (ICD10-I70.0).   ASSESSMENT  Ms. Mckneely is a 85 y.o. female  with a pmh significant for peptic ulcer disease (recurrent GU), prior breast cancer, hypertension scratch that hyperlipidemia, status post AVR (bioprosthetic), osteopenia, anxiety.  The patient is seen today for evaluation and management of:  1. Chronic gastric ulcer without hemorrhage and without perforation   2. Anxiety   3. Iron deficiency   4. B12 deficiency   5. Anemia, unspecified type    The patient is hemodynamically and clinically stable.  She has a chronic gastric ulcer/ulceration that seems to be stable on current PPI therapy.  She was not able to tolerate Carafate due to its side effect profile and all the other medications that she is on.  However she has not manifested any other issues.  We discussed, the consideration of an endoscopic ultrasound to better delineate the thickening that have been noted on her prior CT in January however she would like to minimize further endoscopies at this point in time and potentially moving forward in the future.  I said we should go ahead and consider re-reviewing this in a few months closer to December or January and at that point can consider the role of endoscopic ultrasound to see if there is anything further other than thickening of that region.  She has been biopsied for years and no evidence of premalignant cells been found and her CT does not show any evidence of masslike changes.  She understands my concern as to whether there could be something that we are missing although she has been extensively biopsied and nothing is been found.  At this point we will monitor and surveilled based on her symptoms and her follow-up in December/January.  I offered her one final colonoscopy for screening purposes and she wants to think on that at this point in time.  She wants to minimize the risk of potential iatrogenic complications and I agree with her but based on her overall health and life expectancy of likely greater than  10 years she may still be a candidate for surveillance.  After completion of today's visit, I was able to further review epic and the laboratories that have been done recently, and I found that the patient's labs were checked by her primary within the last week and a half and she has evidence of B12 insufficiency as well as iron insufficiency/deficiency.  We will plan to reach out to the patient next week and discuss being initiated on B12 orally as well as iron orally.  If she remains iron deficient or insufficient in approximately 2 months of therapy, then I will have a much higher concern and desire for [place] to consider a colonoscopy.  We will reach out to her next week to discuss the reasoning.  All patient questions were answered, to the best of my ability, and the patient agrees to the aforementioned plan of action with follow-up as indicated.   PLAN  Continue PPI twice daily We will consider endoscopic ultrasound in January to evaluate the thickening and chronic ulceration of the stomach Consider colonoscopy in future as one final surveillance Will initiate iron (ferrous gluconate 324 mg daily) Initiate vitamin B12 2000 mcg daily and consider B12 injections per PCP recommendations Would recheck iron/TIBC/ferritin/B12 in approximately 6 weeks after initiation of therapy If remains iron insufficient/deficient will strongly consider colonoscopy at that time   No orders of the defined types were placed in this encounter.   New Prescriptions   No medications on file   Modified Medications  Modified Medication Previous Medication   PANTOPRAZOLE (PROTONIX) 40 MG TABLET pantoprazole (PROTONIX) 40 MG tablet      Take 1 tablet (40 mg total) by mouth 2 (two) times daily.    Take 1 tablet (40 mg total) by mouth 2 (two) times daily.    Planned Follow Up: Return in about 5 months (around 05/26/2019).   Justice Britain, MD [facility] Office # [phone]

## 2018-12-24 NOTE — Patient Instructions (Signed)
If you are age 83 or older, your body mass index should be between 23-30. Your Body mass index is 26.63 kg/m. If this is out of the aforementioned range listed, please consider follow up with your Primary Care Provider.  We have sent the following medications to your pharmacy for you to pick up at your convenience: Pantoprazole   We will see you back in Jan 2021.   Thank you for choosing me and [facility].  Dr. Rush Landmark

## 2018-12-26 ENCOUNTER — Encounter: Payer: Self-pay | Admitting: Gastroenterology

## 2018-12-26 DIAGNOSIS — E611 Iron deficiency: Secondary | ICD-10-CM | POA: Insufficient documentation

## 2018-12-26 DIAGNOSIS — D649 Anemia, unspecified: Secondary | ICD-10-CM | POA: Insufficient documentation

## 2018-12-27 ENCOUNTER — Telehealth: Payer: Self-pay

## 2018-12-27 DIAGNOSIS — E611 Iron deficiency: Secondary | ICD-10-CM

## 2018-12-27 DIAGNOSIS — E538 Deficiency of other specified B group vitamins: Secondary | ICD-10-CM

## 2018-12-27 DIAGNOSIS — D649 Anemia, unspecified: Secondary | ICD-10-CM

## 2018-12-27 MED ORDER — FERROUS GLUCONATE 324 (38 FE) MG PO TABS: 324.0000 mg | ORAL_TABLET | Freq: Every day | ORAL | 0 refills | Status: DC

## 2018-12-27 MED ORDER — CYANOCOBALAMIN 2000 MCG PO TABS: 2000.0000 ug | ORAL_TABLET | Freq: Every day | ORAL | 0 refills | Status: AC

## 2018-12-27 NOTE — Telephone Encounter (Signed)
The patient has been notified of this information and all questions answered.

## 2018-12-27 NOTE — Telephone Encounter (Signed)
The pt has been advised of the recommendations and will pick up ferrous gluconate at her pharmacy. She will come in for repeat labs in 6 weeks.   She tells me she has been on 2000 mcg of B12 twice daily for 4 months. Please advise if she needs changes to B12 medication.

## 2018-12-27 NOTE — Telephone Encounter (Signed)
Left message on machine to call back  

## 2018-12-27 NOTE — Telephone Encounter (Signed)
-----   Message from Irving Copas., MD sent at 12/26/2018  5:42 PM EDT ----- Regarding: Follow-up [place],After completing my note this weekend and reviewing her chart, it looks like she does have some iron insufficiency as well as B12 deficiency and still has anemia.Please initiate per my note 2000 mcg daily of B12.Please initiate ferrous gluconate 324 mg dailyPlan for repeat labs in 6 weeks.Can you please reach out to the patient and let her know that that is my recommendation.If B12 injections are to be considered, will be deferred to the patient's primary care provider.If her labs show persistent iron insufficiency after 6 weeks of therapy then I would be more strongly to consider a colonoscopy and if you could let her know that that would be great.Happy to be able to discuss things with her further if she has questions please let her know these are my final recommendations after fully reviewing her chart.Thank you.GM

## 2018-12-27 NOTE — Telephone Encounter (Signed)
Continue taking B12 2000 mcg daily. I would recommend that we initiate B12 injections at this point in time. I will relay this information to her PCP (Dr. Quay Burow) so that she can work on arranging if she agrees as well. Thanks. GM

## 2018-12-28 DIAGNOSIS — L720 Epidermal cyst: Secondary | ICD-10-CM | POA: Diagnosis not present

## 2018-12-28 DIAGNOSIS — D692 Other nonthrombocytopenic purpura: Secondary | ICD-10-CM | POA: Diagnosis not present

## 2019-01-11 DIAGNOSIS — H04123 Dry eye syndrome of bilateral lacrimal glands: Secondary | ICD-10-CM | POA: Diagnosis not present

## 2019-01-11 DIAGNOSIS — H348312 Tributary (branch) retinal vein occlusion, right eye, stable: Secondary | ICD-10-CM | POA: Diagnosis not present

## 2019-01-11 DIAGNOSIS — H35371 Puckering of macula, right eye: Secondary | ICD-10-CM | POA: Diagnosis not present

## 2019-01-11 DIAGNOSIS — Z961 Presence of intraocular lens: Secondary | ICD-10-CM | POA: Diagnosis not present

## 2019-01-11 DIAGNOSIS — D3141 Benign neoplasm of right ciliary body: Secondary | ICD-10-CM | POA: Diagnosis not present

## 2019-01-13 ENCOUNTER — Other Ambulatory Visit: Payer: Self-pay | Admitting: Internal Medicine

## 2019-01-13 DIAGNOSIS — Z1231 Encounter for screening mammogram for malignant neoplasm of breast: Secondary | ICD-10-CM

## 2019-01-18 ENCOUNTER — Encounter: Payer: Self-pay | Admitting: Internal Medicine

## 2019-01-19 ENCOUNTER — Encounter: Payer: Self-pay | Admitting: Internal Medicine

## 2019-01-19 MED ORDER — CHLORAL HYDRATE CRYS: 10.0000 mL | CRYSTALS | Freq: Every evening | 1 refills | Status: DC | PRN

## 2019-01-25 ENCOUNTER — Encounter: Payer: Self-pay | Admitting: Internal Medicine

## 2019-01-29 ENCOUNTER — Telehealth: Payer: Self-pay | Admitting: Internal Medicine

## 2019-01-29 DIAGNOSIS — R918 Other nonspecific abnormal finding of lung field: Secondary | ICD-10-CM

## 2019-01-29 NOTE — Telephone Encounter (Signed)
Let her know it is time for her 2 year f/u chest CT for her lung nodule -- this was ordered.

## 2019-01-31 NOTE — Telephone Encounter (Signed)
Pt aware of note below and expressed understanding.

## 2019-02-03 ENCOUNTER — Other Ambulatory Visit: Payer: Self-pay

## 2019-02-03 ENCOUNTER — Encounter (HOSPITAL_COMMUNITY): Payer: Self-pay | Admitting: Emergency Medicine

## 2019-02-03 ENCOUNTER — Inpatient Hospital Stay (HOSPITAL_COMMUNITY)
Admission: EM | Admit: 2019-02-03 | Discharge: 2019-02-05 | DRG: 281 | Disposition: A | Discharge status: Home / Self Care | Payer: Medicare Other | Attending: Cardiology | Admitting: Cardiology

## 2019-02-03 ENCOUNTER — Emergency Department (HOSPITAL_COMMUNITY): Payer: Medicare Other

## 2019-02-03 ENCOUNTER — Emergency Department: Payer: Self-pay

## 2019-02-03 DIAGNOSIS — Z79899 Other long term (current) drug therapy: Secondary | ICD-10-CM

## 2019-02-03 DIAGNOSIS — Z20828 Contact with and (suspected) exposure to other viral communicable diseases: Secondary | ICD-10-CM | POA: Diagnosis present

## 2019-02-03 DIAGNOSIS — I739 Peripheral vascular disease, unspecified: Secondary | ICD-10-CM | POA: Diagnosis present

## 2019-02-03 DIAGNOSIS — E875 Hyperkalemia: Secondary | ICD-10-CM | POA: Diagnosis present

## 2019-02-03 DIAGNOSIS — Z800 Family history of malignant neoplasm of digestive organs: Secondary | ICD-10-CM

## 2019-02-03 DIAGNOSIS — Z953 Presence of xenogenic heart valve: Secondary | ICD-10-CM

## 2019-02-03 DIAGNOSIS — Z923 Personal history of irradiation: Secondary | ICD-10-CM

## 2019-02-03 DIAGNOSIS — I110 Hypertensive heart disease with heart failure: Secondary | ICD-10-CM | POA: Diagnosis present

## 2019-02-03 DIAGNOSIS — Z952 Presence of prosthetic heart valve: Secondary | ICD-10-CM

## 2019-02-03 DIAGNOSIS — M8580 Other specified disorders of bone density and structure, unspecified site: Secondary | ICD-10-CM | POA: Diagnosis present

## 2019-02-03 DIAGNOSIS — Z853 Personal history of malignant neoplasm of breast: Secondary | ICD-10-CM

## 2019-02-03 DIAGNOSIS — I214 Non-ST elevation (NSTEMI) myocardial infarction: Principal | ICD-10-CM | POA: Diagnosis present

## 2019-02-03 DIAGNOSIS — I2510 Atherosclerotic heart disease of native coronary artery without angina pectoris: Secondary | ICD-10-CM | POA: Diagnosis present

## 2019-02-03 DIAGNOSIS — H348392 Tributary (branch) retinal vein occlusion, unspecified eye, stable: Secondary | ICD-10-CM | POA: Diagnosis present

## 2019-02-03 DIAGNOSIS — I350 Nonrheumatic aortic (valve) stenosis: Secondary | ICD-10-CM

## 2019-02-03 DIAGNOSIS — F419 Anxiety disorder, unspecified: Secondary | ICD-10-CM | POA: Diagnosis present

## 2019-02-03 DIAGNOSIS — R55 Syncope and collapse: Secondary | ICD-10-CM | POA: Diagnosis present

## 2019-02-03 DIAGNOSIS — Z8711 Personal history of peptic ulcer disease: Secondary | ICD-10-CM

## 2019-02-03 DIAGNOSIS — E785 Hyperlipidemia, unspecified: Secondary | ICD-10-CM | POA: Diagnosis present

## 2019-02-03 DIAGNOSIS — I679 Cerebrovascular disease, unspecified: Secondary | ICD-10-CM | POA: Diagnosis present

## 2019-02-03 DIAGNOSIS — Z87891 Personal history of nicotine dependence: Secondary | ICD-10-CM

## 2019-02-03 DIAGNOSIS — E7800 Pure hypercholesterolemia, unspecified: Secondary | ICD-10-CM | POA: Diagnosis not present

## 2019-02-03 DIAGNOSIS — I080 Rheumatic disorders of both mitral and aortic valves: Secondary | ICD-10-CM | POA: Diagnosis present

## 2019-02-03 DIAGNOSIS — R001 Bradycardia, unspecified: Secondary | ICD-10-CM | POA: Diagnosis present

## 2019-02-03 DIAGNOSIS — Z888 Allergy status to other drugs, medicaments and biological substances status: Secondary | ICD-10-CM

## 2019-02-03 DIAGNOSIS — I059 Rheumatic mitral valve disease, unspecified: Secondary | ICD-10-CM | POA: Diagnosis present

## 2019-02-03 DIAGNOSIS — Z8249 Family history of ischemic heart disease and other diseases of the circulatory system: Secondary | ICD-10-CM

## 2019-02-03 DIAGNOSIS — Z882 Allergy status to sulfonamides status: Secondary | ICD-10-CM

## 2019-02-03 DIAGNOSIS — I472 Ventricular tachycardia: Secondary | ICD-10-CM | POA: Diagnosis present

## 2019-02-03 DIAGNOSIS — R7303 Prediabetes: Secondary | ICD-10-CM | POA: Diagnosis present

## 2019-02-03 DIAGNOSIS — Z841 Family history of disorders of kidney and ureter: Secondary | ICD-10-CM

## 2019-02-03 DIAGNOSIS — I5032 Chronic diastolic (congestive) heart failure: Secondary | ICD-10-CM | POA: Diagnosis present

## 2019-02-03 DIAGNOSIS — Z880 Allergy status to penicillin: Secondary | ICD-10-CM

## 2019-02-03 DIAGNOSIS — R110 Nausea: Secondary | ICD-10-CM | POA: Diagnosis not present

## 2019-02-03 DIAGNOSIS — R1111 Vomiting without nausea: Secondary | ICD-10-CM | POA: Diagnosis not present

## 2019-02-03 DIAGNOSIS — R079 Chest pain, unspecified: Secondary | ICD-10-CM | POA: Diagnosis not present

## 2019-02-03 DIAGNOSIS — I10 Essential (primary) hypertension: Secondary | ICD-10-CM | POA: Diagnosis not present

## 2019-02-03 LAB — COMPREHENSIVE METABOLIC PANEL
ALT: 24 U/L (ref 0–44)
AST: 72 U/L — ABNORMAL HIGH (ref 15–41)
Albumin: 3.9 g/dL (ref 3.5–5.0)
Alkaline Phosphatase: 80 U/L (ref 38–126)
Anion gap: 12 (ref 5–15)
BUN: 16 mg/dL (ref 8–23)
CO2: 18 mmol/L — ABNORMAL LOW (ref 22–32)
Calcium: 8.7 mg/dL — ABNORMAL LOW (ref 8.9–10.3)
Chloride: 100 mmol/L (ref 98–111)
Creatinine, Ser: 0.73 mg/dL (ref 0.44–1.00)
GFR calc Af Amer: >60 mL/min (ref >60)
GFR calc non Af Amer: >60 mL/min (ref >60)
Glucose, Bld: 125 mg/dL — ABNORMAL HIGH (ref 70–99)
Potassium: 5.8 mmol/L — ABNORMAL HIGH (ref 3.5–5.1)
Sodium: 130 mmol/L — ABNORMAL LOW (ref 135–145)
Total Bilirubin: 1.6 mg/dL — ABNORMAL HIGH (ref 0.3–1.2)
Total Protein: 5.5 g/dL — ABNORMAL LOW (ref 6.5–8.1)

## 2019-02-03 LAB — CBC WITH DIFFERENTIAL/PLATELET
Abs Immature Granulocytes: 0.02 10*3/uL (ref 0.00–0.07)
Basophils Absolute: 0 10*3/uL (ref 0.0–0.1)
Basophils Relative: 0 %
Eosinophils Absolute: 0 10*3/uL (ref 0.0–0.5)
Eosinophils Relative: 0 %
HCT: 38.2 % (ref 36.0–46.0)
Hemoglobin: 12.2 g/dL (ref 12.0–15.0)
Immature Granulocytes: 0 %
Lymphocytes Relative: 16 %
Lymphs Abs: 1.4 10*3/uL (ref 0.7–4.0)
MCH: 28.8 pg (ref 26.0–34.0)
MCHC: 31.9 g/dL (ref 30.0–36.0)
MCV: 90.3 fL (ref 80.0–100.0)
Monocytes Absolute: 0.2 10*3/uL (ref 0.1–1.0)
Monocytes Relative: 3 %
Neutro Abs: 7.2 10*3/uL (ref 1.7–7.7)
Neutrophils Relative %: 81 %
Platelets: UNDETERMINED 10*3/uL (ref 150–400)
RBC: 4.23 MIL/uL (ref 3.87–5.11)
RDW: 17.2 % — ABNORMAL HIGH (ref 11.5–15.5)
WBC: 9 10*3/uL (ref 4.0–10.5)
nRBC: 0.2 % (ref 0.0–0.2)

## 2019-02-03 LAB — SARS CORONAVIRUS 2 BY RT PCR (HOSPITAL ORDER, PERFORMED IN ~~LOC~~ HOSPITAL LAB): SARS Coronavirus 2: NEGATIVE

## 2019-02-03 LAB — TROPONIN I (HIGH SENSITIVITY)
Troponin I (High Sensitivity): 3622 ng/L (ref <18)
Troponin I (High Sensitivity): 18701 ng/L (ref <18)

## 2019-02-03 LAB — POTASSIUM: Potassium: 3.4 mmol/L — ABNORMAL LOW (ref 3.5–5.1)

## 2019-02-03 MED ORDER — SODIUM CHLORIDE 0.9% FLUSH: 3.0000 mL | INTRAVENOUS | Status: DC | PRN

## 2019-02-03 MED ORDER — HEPARIN BOLUS VIA INFUSION
4000.0000 [IU] | Freq: Once | INTRAVENOUS | Status: AC
  Administered 2019-02-03: 4000 [IU] via INTRAVENOUS
  Filled 2019-02-03: qty 4000

## 2019-02-03 MED ORDER — ROSUVASTATIN CALCIUM 5 MG PO TABS
10.0000 mg | ORAL_TABLET | Freq: Every day | ORAL | Status: DC
  Administered 2019-02-04 – 2019-02-05 (×2): 10 mg via ORAL
  Filled 2019-02-03 (×3): qty 2

## 2019-02-03 MED ORDER — POTASSIUM CHLORIDE CRYS ER 20 MEQ PO TBCR
40.0000 meq | EXTENDED_RELEASE_TABLET | Freq: Once | ORAL | Status: AC
  Administered 2019-02-03: 40 meq via ORAL
  Filled 2019-02-03: qty 2

## 2019-02-03 MED ORDER — SODIUM CHLORIDE 0.9% FLUSH
3.0000 mL | Freq: Two times a day (BID) | INTRAVENOUS | Status: DC
  Administered 2019-02-03: 3 mL via INTRAVENOUS

## 2019-02-03 MED ORDER — SODIUM CHLORIDE 0.9 % IV SOLN: 250.0000 mL | INTRAVENOUS | Status: DC | PRN

## 2019-02-03 MED ORDER — ASPIRIN EC 81 MG PO TBEC
81.0000 mg | DELAYED_RELEASE_TABLET | Freq: Every day | ORAL | Status: DC
  Filled 2019-02-03: qty 1

## 2019-02-03 MED ORDER — ASPIRIN 81 MG PO CHEW
324.0000 mg | CHEWABLE_TABLET | ORAL | Status: AC
  Administered 2019-02-03: 324 mg via ORAL
  Filled 2019-02-03: qty 4

## 2019-02-03 MED ORDER — FLUTICASONE PROPIONATE 50 MCG/ACT NA SUSP
2.0000 | Freq: Every day | NASAL | Status: DC
  Administered 2019-02-04 – 2019-02-05 (×2): 2 via NASAL
  Filled 2019-02-03: qty 16

## 2019-02-03 MED ORDER — ONDANSETRON HCL 4 MG/2ML IJ SOLN: 4.0000 mg | Freq: Four times a day (QID) | INTRAMUSCULAR | Status: DC | PRN

## 2019-02-03 MED ORDER — ASPIRIN 81 MG PO CHEW
81.0000 mg | CHEWABLE_TABLET | ORAL | Status: AC
  Administered 2019-02-04: 81 mg via ORAL
  Filled 2019-02-03: qty 1

## 2019-02-03 MED ORDER — CHLORHEXIDINE GLUCONATE CLOTH 2 % EX PADS
6.0000 | MEDICATED_PAD | Freq: Every day | CUTANEOUS | Status: DC
  Administered 2019-02-03 – 2019-02-05 (×3): 6 via TOPICAL

## 2019-02-03 MED ORDER — MONTELUKAST SODIUM 10 MG PO TABS
10.0000 mg | ORAL_TABLET | Freq: Every day | ORAL | Status: DC
  Administered 2019-02-03 – 2019-02-04 (×2): 10 mg via ORAL
  Filled 2019-02-03 (×3): qty 1

## 2019-02-03 MED ORDER — NITROGLYCERIN 0.4 MG SL SUBL: 0.4000 mg | SUBLINGUAL_TABLET | SUBLINGUAL | Status: DC | PRN

## 2019-02-03 MED ORDER — NITROGLYCERIN 2 % TD OINT
0.5000 [in_us] | TOPICAL_OINTMENT | Freq: Four times a day (QID) | TRANSDERMAL | Status: DC
  Administered 2019-02-03 – 2019-02-04 (×3): 0.5 [in_us] via TOPICAL
  Filled 2019-02-03: qty 1
  Filled 2019-02-03: qty 30

## 2019-02-03 MED ORDER — PANTOPRAZOLE SODIUM 40 MG PO TBEC
40.0000 mg | DELAYED_RELEASE_TABLET | Freq: Two times a day (BID) | ORAL | Status: DC
  Administered 2019-02-03 – 2019-02-05 (×4): 40 mg via ORAL
  Filled 2019-02-03 (×4): qty 1

## 2019-02-03 MED ORDER — SODIUM CHLORIDE 0.9% FLUSH
10.0000 mL | Freq: Two times a day (BID) | INTRAVENOUS | Status: DC
  Administered 2019-02-03: 10 mL

## 2019-02-03 MED ORDER — SODIUM CHLORIDE 0.9% FLUSH: 10.0000 mL | INTRAVENOUS | Status: DC | PRN

## 2019-02-03 MED ORDER — ZOLPIDEM TARTRATE 5 MG PO TABS
5.0000 mg | ORAL_TABLET | Freq: Every evening | ORAL | Status: DC | PRN
  Administered 2019-02-03 – 2019-02-04 (×2): 5 mg via ORAL
  Filled 2019-02-03 (×2): qty 1

## 2019-02-03 MED ORDER — SODIUM CHLORIDE 0.9 % IV BOLUS
500.0000 mL | Freq: Once | INTRAVENOUS | Status: AC
  Administered 2019-02-03: 500 mL via INTRAVENOUS

## 2019-02-03 MED ORDER — ASPIRIN 300 MG RE SUPP: 300.0000 mg | RECTAL | Status: AC

## 2019-02-03 MED ORDER — SODIUM CHLORIDE 0.9 % WEIGHT BASED INFUSION
1.0000 mL/kg/h | INTRAVENOUS | Status: DC
  Administered 2019-02-04: 1 mL/kg/h via INTRAVENOUS

## 2019-02-03 MED ORDER — ACETAMINOPHEN 500 MG PO TABS
500.0000 mg | ORAL_TABLET | Freq: Every evening | ORAL | Status: DC | PRN
  Administered 2019-02-04: 1000 mg via ORAL
  Filled 2019-02-03: qty 2

## 2019-02-03 MED ORDER — ALPRAZOLAM 0.5 MG PO TABS: 0.5000 mg | ORAL_TABLET | Freq: Every day | ORAL | Status: DC | PRN

## 2019-02-03 MED ORDER — ASPIRIN EC 81 MG PO TBEC
81.0000 mg | DELAYED_RELEASE_TABLET | Freq: Every day | ORAL | Status: DC
  Administered 2019-02-05: 81 mg via ORAL
  Filled 2019-02-03: qty 1

## 2019-02-03 MED ORDER — HEPARIN (PORCINE) 25000 UT/250ML-% IV SOLN
1000.0000 [IU]/h | INTRAVENOUS | Status: DC
  Administered 2019-02-03: 850 [IU]/h via INTRAVENOUS
  Filled 2019-02-03: qty 250

## 2019-02-03 MED ORDER — SODIUM CHLORIDE 0.9 % WEIGHT BASED INFUSION
3.0000 mL/kg/h | INTRAVENOUS | Status: AC
  Administered 2019-02-04: 3 mL/kg/h via INTRAVENOUS

## 2019-02-03 MED ORDER — MAGNESIUM OXIDE 400 (241.3 MG) MG PO TABS
400.0000 mg | ORAL_TABLET | Freq: Every day | ORAL | Status: DC
  Administered 2019-02-04 – 2019-02-05 (×2): 400 mg via ORAL
  Filled 2019-02-03 (×3): qty 1

## 2019-02-03 NOTE — ED Provider Notes (Signed)
[facility] DEPART[facility] Provider Note   CSN: [id number] Arrival date & time: 02/03/19  1314     History   Chief Complaint Chief Complaint  Patient presents with  . Chest Pain    HPI [name] is a 85 y.o. female.     Patient states she had some chest discomfort today with frothiness in her left arm.  She states she was nervous about going to the dentist  The history is provided by the patient. No language interpreter was used.  Chest Pain Pain location:  L chest Pain quality: aching   Pain radiates to:  Does not radiate Pain severity:  Mild Onset quality:  Sudden Timing:  Sporadic Progression:  Improving Chronicity:  New Context: not breathing   Associated symptoms: no abdominal pain, no back pain, no cough, no fatigue and no headache     Past Medical History:  Diagnosis Date  . Allergy   . Anemia   . Anxiety   . Aortic valve stenosis, severe   . Breast cancer ([place]) 2007   right  . Cataract   . Hiatal hernia   . Hypercholesteremia   . Hypoglycemia   . Osteopenia   . Personal history of radiation therapy   . Tingling   . Ulcer 2012   bleeding gastric Ulcer    Patient Active Problem List   Diagnosis Date Noted  . Iron deficiency 12/26/2018  . Anemia 12/26/2018  . B12 deficiency 12/23/2018  . Discoloration of skin of finger 11/11/2018  . Chronic gastric ulcer without hemorrhage and without perforation 06/05/2018  . Constipation, chronic 06/05/2018  . Callus of foot 03/04/2018  . Hammertoes of both feet 03/04/2018  . PUD (peptic ulcer disease) 12/21/2017  . Acute blood loss anemia 12/21/2017  . Syncope 12/14/2017  . Macrocytic anemia 12/14/2017  . Near syncope 12/14/2017  . Left sided numbness 10/19/2017  . Small vessel disease, cerebrovascular 10/19/2017  . Numbness and tingling of left arm and leg 10/13/2017  . Anxiety 10/02/2017  . Prediabetes 09/29/2016  . Lung nodule < 6cm on CT 09/29/2016  . Leg  cramps 01/29/2016  . Insomnia 09/12/2015  . Rectal bleed 09/12/2015  . S/P AVR 11/16/2014  . Branch retinal vein occlusion 10/12/2013  . Aortic valve stenosis, severe   . Allergic rhinitis 09/10/2009  . Hyperlipidemia 11/30/2006  . MITRAL VALVE PROLAPSE 10/29/2006  . Osteopenia 10/29/2006  . History of right breast cancer 10/29/2006    Past Surgical History:  Procedure Laterality Date  . AORTIC VALVE REPLACEMENT N/A 11/16/2014   Procedure: AORTIC VALVE REPLACEMENT (AVR);  Surgeon: Gaye Pollack, MD;  Location: [facility];  Service: Open Heart Surgery;  Laterality: N/A;  . BIOPSY  12/15/2017   Procedure: BIOPSY;  Surgeon: Rush Landmark Telford Nab., MD;  Location: [facility];  Service: Gastroenterology;;  . BREAST BIOPSY    . BREAST LUMPECTOMY Right    2005?  Marland Kitchen BREAST SURGERY    . CARDIAC CATHETERIZATION N/A 10/18/2014   Procedure: Right/Left Heart Cath and Coronary Angiography;  Surgeon: [name], MD;  Location: [facility];  Service: Cardiovascular;  Laterality: N/A;  . COSMETIC SURGERY     face  . ESOPHAGOGASTRODUODENOSCOPY (EGD) WITH PROPOFOL N/A 12/15/2017   Procedure: ESOPHAGOGASTRODUODENOSCOPY (EGD) WITH PROPOFOL;  Surgeon: Rush Landmark Telford Nab., MD;  Location: [facility];  Service: Gastroenterology;  Laterality: N/A;  . PARATHYROIDECTOMY    . TEE WITHOUT CARDIOVERSION N/A 11/16/2014   Procedure: TRANSESOPHAGEAL ECHOCARDIOGRAM (TEE);  Surgeon: Gaspar Bidding  Alveria Apley, MD;  Location: [facility];  Service: Open Heart Surgery;  Laterality: N/A;  . TONSILLECTOMY       OB History   No obstetric history on file.      Home Medications    Prior to Admission medications   Medication Sig Start Date End Date Taking? Authorizing Provider  acetaminophen (TYLENOL) 500 MG tablet Take 500-1,000 mg by mouth at bedtime as needed for mild pain or moderate pain.   Yes [provider]  ALPRAZolam (XANAX) 0.5 MG tablet TAKE ONE TABLET BY MOUTH DAILY AS NEEDED FOR ANXIETY *DO NOT TAKE  AT NIGHT* Patient taking differently: Take 0.5 mg by mouth daily as needed. TAKE ONE TABLET BY MOUTH DAILY AS NEEDED FOR ANXIETY *DO NOT TAKE AT NIGHT* 12/08/18  Yes Burns, Claudina Lick, MD  Calcium Carb-Cholecalciferol (CALCIUM 600+D3) 600-400 MG-UNIT TABS Take 1 tablet by mouth daily.   Yes [provider]  Chloral Hydrate CRYS Take 10 mLs by mouth at bedtime as needed (Call  liquid into [facility]). 01/19/19  Yes Burns, Claudina Lick, MD  Cholecalciferol (VITAMIN D-3) 1000 UNITS CAPS Take 1 capsule by mouth daily.    Yes [provider]  clindamycin (CLEOCIN) 300 MG capsule Take 300 mg by mouth as directed. Take 600 mg (2 capsules) by mouth one hour prior to dental procedures    Yes [provider]  Coenzyme Q10 (CO Q 10 PO) Take 1 capsule by mouth daily.   Yes [provider]  cyanocobalamin 2000 MCG tablet Take 1 tablet (2,000 mcg total) by mouth daily. 12/27/18 02/10/19 Yes Mansouraty, Telford Nab., MD  ferrous gluconate (FERGON) 324 MG tablet Take 1 tablet (324 mg total) by mouth daily with breakfast. 12/27/18 02/10/19 Yes Mansouraty, Telford Nab., MD  fluticasone [facility]) 50 MCG/ACT nasal spray Place 2 sprays into both nostrils daily. 03/02/18  Yes Marrian Salvage, FNP  levocetirizine [facility]) 5 MG tablet  10/11/18  Yes [provider]  Magnesium 400 MG CAPS Take 400 mg by mouth every morning.    Yes [provider]  montelukast (SINGULAIR) 10 MG tablet  09/16/18  Yes [provider]  Ophthalmic Irrigation Solution (OCUSOFT EYE [place] [facility]) Apply 1 application to eye 2 (two) times daily as needed (dry eyes). Morning and before bed   Yes [provider]  pantoprazole (PROTONIX) 40 MG tablet Take 1 tablet (40 mg total) by mouth 2 (two) times daily. 12/24/18  Yes Mansouraty, Telford Nab., MD  Polyethyl Glycol-Propyl Glycol (SYSTANE ULTRA PF) 0.4-0.3 % SOLN Apply 1 drop to eye 2 (two) times daily.   Yes [provider]   rosuvastatin (CRESTOR) 10 MG tablet TAKE ONE TABLET BY MOUTH ONE TIME DAILY  06/07/18  Yes Burns, Claudina Lick, MD  Wheat Dextrin (BENEFIBER DRINK MIX PO) Take 5 mLs by mouth every morning. Mix with 1/2 a glass water, and takes with vitamins   Yes [provider]    Family History Family History  Problem Relation Age of Onset  . Hypertension Mother   . Rectal cancer Mother   . Colon cancer Mother   . Kidney disease Father   . Esophageal cancer Neg Hx   . Stomach cancer Neg Hx   . Colon polyps Neg Hx     Social History Social History   Tobacco Use  . Smoking status: Former Smoker    Packs/day: 0.50    Years: 30.00    Pack years: 15.00    Types:  Cigarettes  . Smokeless tobacco: Never Used  . Tobacco comment: quit smoking 1980  Substance Use Topics  . Alcohol use: Yes    Alcohol/week: 7.0 standard drinks    Types: 7 Glasses of wine per week    Comment: wine nightly with dinner  . Drug use: No     Allergies   Amoxicillin-pot clavulanate, Penicillin g, Penicillins, Sulfa antibiotics, Sulfamethoxazole, and Sulfonamide derivatives   Review of Systems Review of Systems  Constitutional: Negative for appetite change and fatigue.  HENT: Negative for congestion, ear discharge and sinus pressure.   Eyes: Negative for discharge.  Respiratory: Negative for cough.   Cardiovascular: Positive for chest pain.  Gastrointestinal: Negative for abdominal pain and diarrhea.  Genitourinary: Negative for frequency and hematuria.  Musculoskeletal: Negative for back pain.  Skin: Negative for rash.  Neurological: Negative for seizures and headaches.  Psychiatric/Behavioral: Negative for hallucinations.     Physical Exam Updated Vital Signs Pulse (!) 53   Temp 97.7 F (36.5 C) (Oral)   Resp 17   Ht 5\' 5"  (1.651 m)   Wt 70.3 kg   SpO2 100%   BMI 25.79 kg/m   Physical Exam Vitals signs and nursing note reviewed.  Constitutional:      Appearance: She is well-developed.   HENT:     Head: Normocephalic.     Nose: Nose normal.  Eyes:     General: No scleral icterus.    Conjunctiva/sclera: Conjunctivae normal.  Neck:     Musculoskeletal: Neck supple.     Thyroid: No thyromegaly.  Cardiovascular:     Rate and Rhythm: Normal rate and regular rhythm.     Heart sounds: No murmur. No friction rub. No gallop.   Pulmonary:     Breath sounds: No stridor. No wheezing or rales.  Chest:     Chest wall: No tenderness.  Abdominal:     General: There is no distension.     Tenderness: There is no abdominal tenderness. There is no rebound.  Musculoskeletal: Normal range of motion.  Lymphadenopathy:     Cervical: No cervical adenopathy.  Skin:    Findings: No erythema or rash.  Neurological:     Mental Status: She is oriented to person, place, and time.     Motor: No abnormal muscle tone.     Coordination: Coordination normal.  Psychiatric:        Behavior: Behavior normal.      ED Treatments / Results  Labs (all labs ordered are listed, but only abnormal results are displayed) Labs Reviewed  CBC WITH DIFFERENTIAL/PLATELET  COMPREHENSIVE METABOLIC PANEL  TROPONIN I (HIGH SENSITIVITY)  TROPONIN I (HIGH SENSITIVITY)    EKG EKG Interpretation  Date/Time:  Thursday February 03 2019 13:36:34 EDT Ventricular Rate:  57 PR Interval:    QRS Duration: 103 QT Interval:  502 QTC Calculation: 489 R Axis:   -5 Text Interpretation:  Sinus rhythm Left ventricular hypertrophy Anterior Q waves, possibly due to LVH Minimal ST elevation, lateral leads Confirmed by Milton Ferguson [phone]) on 02/03/2019 2:53:52 PM   Radiology Dg Chest Port 1 View  Result Date: 02/03/2019 CLINICAL DATA:  Chest pain EXAM: PORTABLE CHEST 1 VIEW COMPARISON:  CT chest 02/02/2017 FINDINGS: There is no focal consolidation. There is no pleural effusion or pneumothorax. The heart and mediastinal contours are unremarkable. There is evidence of prior aortic valve replacement and median  sternotomy. 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 IMPRESSION: No acute cardiopulmonary disease. Electronically Signed  By: Kathreen Devoid   On: 02/03/2019 14:05    Procedures Aspiration of blood/fluid  Date/Time: 02/03/2019 3:42 PM Performed by: Milton Ferguson, MD Authorized by: Milton Ferguson, MD  Comments: Patient had blood drawn from her right femoral vein with an 18-gauge needle and 20 cc syringe.  Patient tolerated the procedure well     (including critical care time)  Medications Ordered in ED Medications  nitroGLYCERIN (NITROSTAT) SL tablet 0.4 mg (has no administration in time range)  sodium chloride 0.9 % bolus 500 mL (has no administration in time range)     Initial Impression / Assessment and Plan / ED Course  I have reviewed the triage vital signs and the nursing notes.  Pertinent labs & imaging results that were available during my care of the patient were reviewed by me and considered in my medical decision making (see chart for details). CRITICAL CARE Performed by: Milton Ferguson Total critical care time: 40 minutes Critical care time was exclusive of separately billable procedures and treating other patients. Critical care was necessary to treat or prevent imminent or life-threatening deterioration. Critical care was time spent personally by me on the following activities: development of treatment plan with patient and/or surrogate as well as nursing, discussions with consultants, evaluation of patient's response to treatment, examination of patient, obtaining history from patient or surrogate, ordering and performing treatments and interventions, ordering and review of laboratory studies, ordering and review of radiographic studies, pulse oximetry and re-evaluation of patient's condition.       EKG and chest x-ray unremarkable labs pending Patient with elevated troponin.  Minimal chest symptoms.  Cardiology coming to see patient Final  Clinical Impressions(s) / ED Diagnoses   Final diagnoses:  None    ED Discharge Orders    None       Milton Ferguson, MD 02/03/19 [phone]

## 2019-02-03 NOTE — Plan of Care (Signed)

## 2019-02-03 NOTE — Progress Notes (Signed)
Attempted PIV x 2 unsuccessful.  Assessed both arms with ultrasound and unable to find suitable vein for PIV.  Discuss possibility of midline  Pt. Refusing this at this time.

## 2019-02-03 NOTE — ED Notes (Signed)
ED TO INPATIENT HANDOFF REPORT  ED Nurse Name and Phone #: [phone] Threasa Beards, RN  S Name/Age/Gender [place] 85 y.o. female Room/Bed: 037C/037C  Code Status   Code Status: Full Code  Home/SNF/Other SNF Patient oriented to: self, place, time and situation Is this baseline? Yes   Triage Complete: Triage complete  Chief Complaint cp  Triage Note Pt BIB [facility] from [facility]. Pt states she began having chest pain this morning around 0945. Pt states she felt pressure in the center of her chest and a fuzzy sensation in her left arm. Pt states she returned home and had one bowel movement and one episode of emesis. Pt states she was feeling anxious this morning due to having to go to the dentist.   Allergies Allergies  Allergen Reactions  . Amoxicillin-Pot Clavulanate Hives and Rash  . Penicillin G Rash  . Penicillins Hives and Rash  . Sulfa Antibiotics Rash  . Sulfamethoxazole Rash  . Sulfonamide Derivatives Hives    Level of Care/Admitting Diagnosis ED Disposition    ED Disposition Condition Comment   Admit  Hospital Area: [facility] [[id number]]  Level of Care: Progressive [102]  Covid Evaluation: Asymptomatic Screening Protocol (No Symptoms)  Diagnosis: NSTEMI (non-ST elevated myocardial infarction) [facility]) [[id number]]  Admitting Physician: Skeet Latch [[id number]]  Attending Physician: Skeet Latch [[id number]]  PT Class (Do Not Modify): Observation [104]  PT Acc Code (Do Not Modify): Observation [10022]       B Medical/Surgery History Past Medical History:  Diagnosis Date  . Allergy   . Anemia   . Anxiety   . Aortic valve stenosis, severe   . Breast cancer ([place]) 2007   right  . Cataract   . Hiatal hernia   . Hypercholesteremia   . Hypoglycemia   . Osteopenia   . Personal history of radiation therapy   . Tingling   . Ulcer 2012   bleeding gastric Ulcer   Past Surgical History:  Procedure Laterality Date  .  AORTIC VALVE REPLACEMENT N/A 11/16/2014   Procedure: AORTIC VALVE REPLACEMENT (AVR);  Surgeon: Gaye Pollack, MD;  Location: [facility];  Service: Open Heart Surgery;  Laterality: N/A;  . BIOPSY  12/15/2017   Procedure: BIOPSY;  Surgeon: Rush Landmark Telford Nab., MD;  Location: [facility];  Service: Gastroenterology;;  . BREAST BIOPSY    . BREAST LUMPECTOMY Right    2005?  Marland Kitchen BREAST SURGERY    . CARDIAC CATHETERIZATION N/A 10/18/2014   Procedure: Right/Left Heart Cath and Coronary Angiography;  Surgeon: [name], MD;  Location: [facility];  Service: Cardiovascular;  Laterality: N/A;  . COSMETIC SURGERY     face  . ESOPHAGOGASTRODUODENOSCOPY (EGD) WITH PROPOFOL N/A 12/15/2017   Procedure: ESOPHAGOGASTRODUODENOSCOPY (EGD) WITH PROPOFOL;  Surgeon: Rush Landmark Telford Nab., MD;  Location: [facility];  Service: Gastroenterology;  Laterality: N/A;  . PARATHYROIDECTOMY    . TEE WITHOUT CARDIOVERSION N/A 11/16/2014   Procedure: TRANSESOPHAGEAL ECHOCARDIOGRAM (TEE);  Surgeon: Gaye Pollack, MD;  Location: [facility];  Service: Open Heart Surgery;  Laterality: N/A;  . TONSILLECTOMY       A IV Location/Drains/Wounds Patient Lines/Drains/Airways Status   Active Line/Drains/Airways    None          Intake/Output Last 24 hours No intake or output data in the 24 hours ending 02/03/19 2027  Labs/Imaging Results for orders placed or performed during the hospital encounter of 02/03/19 (from the past 48 hour(s))  CBC with Differential/Platelet  Status: Abnormal   Collection Time: 02/03/19  3:15 PM  Result Value Ref Range   WBC 9.0 4.0 - 10.5 K/uL   RBC 4.23 3.87 - 5.11 MIL/uL   Hemoglobin 12.2 12.0 - 15.0 g/dL   HCT 38.2 36.0 - 46.0 %   MCV 90.3 80.0 - 100.0 fL   MCH 28.8 26.0 - 34.0 pg   MCHC 31.9 30.0 - 36.0 g/dL   RDW 17.2 (H) 11.5 - 15.5 %   Platelets PLATELET CLUMPS NOTED ON SMEAR, UNABLE TO ESTIMATE 150 - 400 K/uL   nRBC 0.2 0.0 - 0.2 %   Neutrophils Relative % 81 %   Neutro  Abs 7.2 1.7 - 7.7 K/uL   Lymphocytes Relative 16 %   Lymphs Abs 1.4 0.7 - 4.0 K/uL   Monocytes Relative 3 %   Monocytes Absolute 0.2 0.1 - 1.0 K/uL   Eosinophils Relative 0 %   Eosinophils Absolute 0.0 0.0 - 0.5 K/uL   Basophils Relative 0 %   Basophils Absolute 0.0 0.0 - 0.1 K/uL   Immature Granulocytes 0 %   Abs Immature Granulocytes 0.02 0.00 - 0.07 K/uL    Comment: Performed at [facility], [address] [place], [place] [address]  Comprehensive metabolic panel     Status: Abnormal   Collection Time: 02/03/19  3:15 PM  Result Value Ref Range   Sodium 130 (L) 135 - 145 mmol/L   Potassium 5.8 (H) 3.5 - 5.1 mmol/L    Comment: HEMOLYSIS AT THIS LEVEL MAY AFFECT RESULT   Chloride 100 98 - 111 mmol/L   CO2 18 (L) 22 - 32 mmol/L   Glucose, Bld 125 (H) 70 - 99 mg/dL   BUN 16 8 - 23 mg/dL   Creatinine, Ser 0.73 0.44 - 1.00 mg/dL   Calcium 8.7 (L) 8.9 - 10.3 mg/dL   Total Protein 5.5 (L) 6.5 - 8.1 g/dL   Albumin 3.9 3.5 - 5.0 g/dL   AST 72 (H) 15 - 41 U/L   ALT 24 0 - 44 U/L   Alkaline Phosphatase 80 38 - 126 U/L   Total Bilirubin 1.6 (H) 0.3 - 1.2 mg/dL   GFR calc non Af Amer >60 >60 mL/min   GFR calc Af Amer >60 >60 mL/min   Anion gap 12 5 - 15    Comment: Performed at [facility], [place] [address] [place], [place] [address]  Troponin I (High Sensitivity)     Status: Abnormal   Collection Time: 02/03/19  3:15 PM  Result Value Ref Range   Troponin I (High Sensitivity) 3,622 (HH) <18 ng/L    Comment: CRITICAL RESULT CALLED TO, READ BACK BY AND VERIFIED WITH: HARDY,R RN 02/03/2019 1622 JORDANS (NOTE) Elevated high sensitivity troponin I (hsTnI) values and significant  changes across serial measurements may suggest ACS but many other  chronic and acute conditions are known to elevate hsTnI results.  Refer to the Links section for chest pain algorithms and additional  guidance. Performed at [facility], [place] [address], [place], [place] [address]   SARS  Coronavirus 2 by RT PCR (hospital order, performed in [facility] hospital lab) Nasopharyngeal Nasopharyngeal Swab     Status: None   Collection Time: 02/03/19  5:25 PM   Specimen: Nasopharyngeal Swab  Result Value Ref Range   SARS Coronavirus 2 NEGATIVE NEGATIVE    Comment: (NOTE) If result is NEGATIVE SARS-CoV-2 target nucleic acids are NOT DETECTED. The SARS-CoV-2 RNA is generally  detectable in upper and lower  respiratory specimens during the acute phase of infection. The lowest  concentration of SARS-CoV-2 viral copies this assay can detect is 250  copies / mL. A negative result does not preclude SARS-CoV-2 infection  and should not be used as the sole basis for treatment or other  patient management decisions.  A negative result may occur with  improper specimen collection / handling, submission of specimen other  than nasopharyngeal swab, presence of viral mutation(s) within the  areas targeted by this assay, and inadequate number of viral copies  (<250 copies / mL). A negative result must be combined with clinical  observations, patient history, and epidemiological information. If result is POSITIVE SARS-CoV-2 target nucleic acids are DETECTED. The SARS-CoV-2 RNA is generally detectable in upper and lower  respiratory specimens dur ing the acute phase of infection.  Positive  results are indicative of active infection with SARS-CoV-2.  Clinical  correlation with patient history and other diagnostic information is  necessary to determine patient infection status.  Positive results do  not rule out bacterial infection or co-infection with other viruses. If result is PRESUMPTIVE POSTIVE SARS-CoV-2 nucleic acids MAY BE PRESENT.   A presumptive positive result was obtained on the submitted specimen  and confirmed on repeat testing.  While 2019 novel coronavirus  (SARS-CoV-2) nucleic acids may be present in the submitted sample  additional confirmatory testing may be necessary for  epidemiological  and / or clinical management purposes  to differentiate between  SARS-CoV-2 and other Sarbecovirus currently known to infect humans.  If clinically indicated additional testing with an alternate test  methodology [phone]) is advised. The SARS-CoV-2 RNA is generally  detectable in upper and lower respiratory sp ecimens during the acute  phase of infection. The expected result is Negative. Fact Sheet for Patients:  StrictlyIdeas.no Fact Sheet for Healthcare Providers: BankingDealers.co.za This test is not yet approved or cleared by the [place] FDA and has been authorized for detection and/or diagnosis of SARS-CoV-2 by FDA under an Emergency Use Authorization (EUA).  This EUA will remain in effect (meaning this test can be used) for the duration of the COVID-19 declaration under Section 564(b)(1) of the Act, 21 U.S.C. section 360bbb-3(b)(1), unless the authorization is terminated or revoked sooner. Performed at [facility], [place] [address], [place], [place] [address]    Dg Chest Port 1 View  Result Date: 02/03/2019 CLINICAL DATA:  Chest pain EXAM: PORTABLE CHEST 1 VIEW COMPARISON:  CT chest 02/02/2017 FINDINGS: There is no focal consolidation. There is no pleural effusion or pneumothorax. The heart and mediastinal contours are unremarkable. There is evidence of prior aortic valve replacement and median sternotomy. 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 IMPRESSION: No acute cardiopulmonary disease. Electronically Signed   By: Kathreen Devoid   On: 02/03/2019 14:05   [place] Ekg Site Rite  Result Date: 02/03/2019 If Site Rite image not attached, placement could not be confirmed due to current cardiac rhythm.   Pending Labs Unresulted Labs (From admission, onward)    Start     Ordered   02/04/19 0500  Heparin level (unfractionated)  Daily,   R     02/03/19 1702   02/04/19 0500  CBC  Daily,   R      02/03/19 1702   02/04/19 4782  Basic metabolic panel  Tomorrow morning,   R     02/03/19 1848   02/04/19 0500  Lipid panel  Tomorrow morning,  R     02/03/19 1848   02/04/19 0100  Heparin level (unfractionated)  Once-Timed,   STAT     02/03/19 1702   02/03/19 1940  Potassium  Once,   STAT     02/03/19 1939          Vitals/Pain Today's Vitals   02/03/19 1322 02/03/19 1330 02/03/19 1345 02/03/19 1400  BP:  (!) 167/70 (!) 176/74 (!) 163/69  Pulse: (!) 53 (!) 56 (!) 58 (!) 53  Resp: 17 19 18 16   Temp: 97.7 F (36.5 C)     TempSrc: Oral     SpO2: 100% 100% 100% 100%  Weight:      Height:      PainSc:        Isolation Precautions No active isolations  Medications Medications  nitroGLYCERIN (NITROSTAT) SL tablet 0.4 mg (has no administration in time range)  sodium chloride 0.9 % bolus 500 mL (has no administration in time range)  heparin bolus via infusion 4,000 Units (has no administration in time range)  heparin ADULT infusion 100 units/mL (25000 units/232mL sodium chloride 0.45%) (has no administration in time range)  nitroGLYCERIN (NITROGLYN) 2 % ointment 0.5 inch (has no administration in time range)  zolpidem (AMBIEN) tablet 5 mg (has no administration in time range)  acetaminophen (TYLENOL) tablet 500-1,000 mg (has no administration in time range)  rosuvastatin (CRESTOR) tablet 10 mg (has no administration in time range)  ALPRAZolam (XANAX) tablet 0.5 mg (has no administration in time range)  pantoprazole (PROTONIX) EC tablet 40 mg (has no administration in time range)  Magnesium CAPS 400 mg (has no administration in time range)  montelukast (SINGULAIR) tablet 10 mg (has no administration in time range)  fluticasone (FLONASE) 50 MCG/ACT nasal spray 2 spray (has no administration in time range)  aspirin chewable tablet 324 mg (has no administration in time range)    Or  aspirin suppository 300 mg (has no administration in time range)  aspirin EC tablet 81 mg  (has no administration in time range)  ondansetron (ZOFRAN) injection 4 mg (has no administration in time range)  sodium chloride flush (NS) 0.9 % injection 3 mL (has no administration in time range)  sodium chloride flush (NS) 0.9 % injection 3 mL (has no administration in time range)  0.9 %  sodium chloride infusion (has no administration in time range)  aspirin chewable tablet 81 mg (has no administration in time range)  0.9% sodium chloride infusion (has no administration in time range)    Followed by  0.9% sodium chloride infusion (has no administration in time range)    Mobility walks Low fall risk   Focused Assessments Cardiac Assessment Handoff:  Cardiac Rhythm: Normal sinus rhythm Lab Results  Component Value Date   TROPONINI <0.03 12/15/2017   No results found for: DDIMER Does the Patient currently have chest pain? No     R Recommendations: See Admitting Provider Note  Report given to:   Additional Notes:

## 2019-02-03 NOTE — H&P (Signed)
Cardiology Admission History and Physical:   Patient ID: [name] MRN: [id number]; DOB: 11-Nov-1932   Admission date: 02/03/2019  Primary Care Provider: [name], MD Primary Cardiologist: [name], MD  Primary Electrophysiologist:  None   Chief Complaint: Chest pain  Patient Profile:   [name] is a 85 y.o. female with hyperlipidemia, history of gastric ulcer, bradycardia, and a history of aortic stenosis s/p AVR in July 2016 presented with chest pain  History of Present Illness:   [name] is a 85 year old female with past medical history of hyperlipidemia, history of gastric ulcer, bradycardia, and a history of aortic stenosis s/p AVR in July 2016.  Cardiac catheterization performed prior to AVR was normal.  Her last office visit with Dr. Burt Knack was in May 2018 at which time she was doing well.  Last echocardiogram performed on 12/15/2017 showed EF 65 to 70%, grade 1 DD, bioprosthetic aortic valve, mild MR.  Patient has been residing in [place].  She normally is quite active at [facility] and able to exercise 30 minutes at a time without any issue.  Her exercise mainly involves the walking.   She was in her usual state of health until this morning.  She started having chest pressure across the front of her chest 15 minutes prior to 10 AM.  She says she was due to go to her dentist office.  She was very nervous about it.  She was instructed to take antibiotic prior to the dental procedure however did not take her SBE prophylaxis until roughly 10 minutes prior to the procedure.  Since this morning, her chest pain has not completely went away.  She eventually sought medical attention at [facility].  Initial lab work was significant for potassium of 5.8.  First high-sensitivity troponin was 3622.  EKG showed a sinus bradycardia without significant ST-T wave changes.  Cardiology has been consulted for potential NSTEMI.   Heart Pathway  Score:     Past Medical History:  Diagnosis Date  . Allergy   . Anemia   . Anxiety   . Aortic valve stenosis, severe   . Breast cancer ([place]) 2007   right  . Cataract   . Hiatal hernia   . Hypercholesteremia   . Hypoglycemia   . Osteopenia   . Personal history of radiation therapy   . Tingling   . Ulcer 2012   bleeding gastric Ulcer    Past Surgical History:  Procedure Laterality Date  . AORTIC VALVE REPLACEMENT N/A 11/16/2014   Procedure: AORTIC VALVE REPLACEMENT (AVR);  Surgeon: Gaye Pollack, MD;  Location: [facility];  Service: Open Heart Surgery;  Laterality: N/A;  . BIOPSY  12/15/2017   Procedure: BIOPSY;  Surgeon: Rush Landmark Telford Nab., MD;  Location: [place];  Service: Gastroenterology;;  . BREAST BIOPSY    . BREAST LUMPECTOMY Right    2005?  Marland Kitchen BREAST SURGERY    . CARDIAC CATHETERIZATION N/A 10/18/2014   Procedure: Right/Left Heart Cath and Coronary Angiography;  Surgeon: [name], MD;  Location: [place] [facility];  Service: Cardiovascular;  Laterality: N/A;  . COSMETIC SURGERY     face  . ESOPHAGOGASTRODUODENOSCOPY (EGD) WITH PROPOFOL N/A 12/15/2017   Procedure: ESOPHAGOGASTRODUODENOSCOPY (EGD) WITH PROPOFOL;  Surgeon: Rush Landmark Telford Nab., MD;  Location: [facility];  Service: Gastroenterology;  Laterality: N/A;  . PARATHYROIDECTOMY    . TEE WITHOUT CARDIOVERSION N/A 11/16/2014   Procedure: TRANSESOPHAGEAL ECHOCARDIOGRAM (TEE);  Surgeon: Gaye Pollack, MD;  Location:  [place] OR;  Service: Open Heart Surgery;  Laterality: N/A;  . TONSILLECTOMY       Medications Prior to Admission: Prior to Admission medications   Medication Sig Start Date End Date Taking? Authorizing Provider  acetaminophen (TYLENOL) 500 MG tablet Take 500-1,000 mg by mouth at bedtime as needed for mild pain or moderate pain.   Yes [provider]  ALPRAZolam (XANAX) 0.5 MG tablet TAKE ONE TABLET BY MOUTH DAILY AS NEEDED FOR ANXIETY *DO NOT TAKE AT NIGHT* Patient taking  differently: Take 0.5 mg by mouth daily as needed. TAKE ONE TABLET BY MOUTH DAILY AS NEEDED FOR ANXIETY *DO NOT TAKE AT NIGHT* 12/08/18  Yes Burns, Claudina Lick, MD  Calcium Carb-Cholecalciferol (CALCIUM 600+D3) 600-400 MG-UNIT TABS Take 1 tablet by mouth daily.   Yes [provider]  Chloral Hydrate CRYS Take 10 mLs by mouth at bedtime as needed (Call  liquid into [facility]). 01/19/19  Yes Burns, Claudina Lick, MD  Cholecalciferol (VITAMIN D-3) 1000 UNITS CAPS Take 1 capsule by mouth daily.    Yes [provider]  clindamycin (CLEOCIN) 300 MG capsule Take 300 mg by mouth as directed. Take 600 mg (2 capsules) by mouth one hour prior to dental procedures    Yes [provider]  Coenzyme Q10 (CO Q 10 PO) Take 1 capsule by mouth daily.   Yes [provider]  cyanocobalamin 2000 MCG tablet Take 1 tablet (2,000 mcg total) by mouth daily. 12/27/18 02/10/19 Yes Mansouraty, Telford Nab., MD  ferrous gluconate (FERGON) 324 MG tablet Take 1 tablet (324 mg total) by mouth daily with breakfast. 12/27/18 02/10/19 Yes Mansouraty, Telford Nab., MD  fluticasone [facility]) 50 MCG/ACT nasal spray Place 2 sprays into both nostrils daily. 03/02/18  Yes Marrian Salvage, FNP  levocetirizine [facility]) 5 MG tablet  10/11/18  Yes [provider]  Magnesium 400 MG CAPS Take 400 mg by mouth every morning.    Yes [provider]  montelukast (SINGULAIR) 10 MG tablet  09/16/18  Yes [provider]  Ophthalmic Irrigation Solution (OCUSOFT EYE [place] OP) Apply 1 application to eye 2 (two) times daily as needed (dry eyes). Morning and before bed   Yes [provider]  pantoprazole (PROTONIX) 40 MG tablet Take 1 tablet (40 mg total) by mouth 2 (two) times daily. 12/24/18  Yes Mansouraty, Telford Nab., MD  Polyethyl Glycol-Propyl Glycol (SYSTANE ULTRA PF) 0.4-0.3 % SOLN Apply 1 drop to eye 2 (two) times daily.   Yes [provider]  rosuvastatin (CRESTOR)  10 MG tablet TAKE ONE TABLET BY MOUTH ONE TIME DAILY  06/07/18  Yes Burns, Claudina Lick, MD  Wheat Dextrin (BENEFIBER DRINK MIX PO) Take 5 mLs by mouth every morning. Mix with 1/2 a glass water, and takes with vitamins   Yes [provider]     Allergies:    Allergies  Allergen Reactions  . Amoxicillin-Pot Clavulanate Hives and Rash  . Penicillin G Rash  . Penicillins Hives and Rash  . Sulfa Antibiotics Rash  . Sulfamethoxazole Rash  . Sulfonamide Derivatives Hives    Social History:   Social History   Socioeconomic History  . Marital status: Married    Spouse name: Not on file  . Number of children: 3  . Years of education: 16  . Highest education level: Bachelor's degree (e.g., BA, AB, BS)  Occupational History  . Occupation: Retired  Scientific laboratory technician  . Financial resource strain: Not hard at all  .  Food insecurity    Worry: Never true    Inability: Never true  . Transportation needs    Medical: No    Non-medical: No  Tobacco Use  . Smoking status: Former Smoker    Packs/day: 0.50    Years: 30.00    Pack years: 15.00    Types: Cigarettes  . Smokeless tobacco: Never Used  . Tobacco comment: quit smoking 1980  Substance and Sexual Activity  . Alcohol use: Yes    Alcohol/week: 7.0 standard drinks    Types: 7 Glasses of wine per week    Comment: wine nightly with dinner  . Drug use: No  . Sexual activity: Not Currently  Lifestyle  . Physical activity    Days per week: 6 days    Minutes per session: 30 min  . Stress: To some extent  Relationships  . Social connections    Talks on phone: More than three times a week    Gets together: More than three times a week    Attends religious service: Not on file    Active member of club or organization: Yes    Attends meetings of clubs or organizations: More than 4 times per year    Relationship status: Married  . Intimate partner violence    Fear of current or ex partner: No    Emotionally abused: No    Physically  abused: No    Forced sexual activity: No  Other Topics Concern  . Not on file  Social History Narrative   Lives [facility] with her husband.   Right-handed.   Caffeine use: 3-4 cups per day (some tea, mixes coffee with decaf).    Family History:   The patient's family history includes Colon cancer in her mother; Hypertension in her mother; Kidney disease in her father; Rectal cancer in her mother. There is no history of Esophageal cancer, Stomach cancer, or Colon polyps.    ROS:  Please see the history of present illness.  All other ROS reviewed and negative.     Physical Exam/Data:   Vitals:   02/03/19 1322 02/03/19 1330 02/03/19 1345 02/03/19 1400  BP:  (!) 167/70 (!) 176/74 (!) 163/69  Pulse: (!) 53 (!) 56 (!) 58 (!) 53  Resp: 17 19 18 16   Temp: 97.7 F (36.5 C)     TempSrc: Oral     SpO2: 100% 100% 100% 100%  Weight:      Height:       No intake or output data in the 24 hours ending 02/03/19 1717 Last 3 Weights 02/03/2019 12/24/2018 12/23/2018  Weight (lbs) 155 lb 160 lb 160 lb 3.2 oz  Weight (kg) 70.308 kg 72.576 kg 72.666 kg     Body mass index is 25.79 kg/m.  General:  Well nourished, well developed, in no acute distress HEENT: normal Lymph: no adenopathy Neck: no JVD Endocrine:  No thryomegaly Vascular: No carotid bruits; FA pulses 2+ bilaterally without bruits  Cardiac:  normal S1, S2; RRR; no murmur  Lungs:  clear to auscultation bilaterally, no wheezing, rhonchi or rales  Abd: soft, nontender, no hepatomegaly  Ext: no edema Musculoskeletal:  No deformities, BUE and BLE strength normal and equal Skin: warm and dry  Neuro:  CNs 2-12 intact, no focal abnormalities noted Psych:  Normal affect    EKG:  The ECG that was done in the ED and was personally reviewed and demonstrates sinus bradycardia, no significant ST-T wave changes  Relevant CV Studies:  Echo 12/15/2017 LV EF: 65% -   70% Study Conclusions  - Left ventricle: The cavity size was  normal. Wall thickness was   normal. Systolic function was vigorous. The estimated ejection   fraction was in the range of 65% to 70%. Doppler parameters are   consistent with abnormal left ventricular relaxation (grade 1   diastolic dysfunction). - Aortic valve: Bioprosthetic valve with mild AR cannot tell if   peri valvular or central Mean gradients and AR unchanged from   previous echo - Mitral valve: There was mild regurgitation. - Left atrium: The atrium was mildly dilated. - Atrial septum: No defect or patent foramen ovale was identified   Laboratory Data:  High Sensitivity Troponin:   Recent Labs  Lab 02/03/19 1515  TROPONINIHS 3,622*      Chemistry Recent Labs  Lab 02/03/19 1515  NA 130*  K 5.8*  CL 100  CO2 18*  GLUCOSE 125*  BUN 16  CREATININE 0.73  CALCIUM 8.7*  GFRNONAA >60  GFRAA >60  ANIONGAP 12    Recent Labs  Lab 02/03/19 1515  PROT 5.5*  ALBUMIN 3.9  AST 72*  ALT 24  ALKPHOS 80  BILITOT 1.6*   Hematology Recent Labs  Lab 02/03/19 1515  WBC 9.0  RBC 4.23  HGB 12.2  HCT 38.2  MCV 90.3  MCH 28.8  MCHC 31.9  RDW 17.2*  PLT PLATELET CLUMPS NOTED ON SMEAR, UNABLE TO ESTIMATE   BNPNo results for input(s): BNP, PROBNP in the last 168 hours.  DDimer No results for input(s): DDIMER in the last 168 hours.   Radiology/Studies:  Dg Chest Port 1 View  Result Date: 02/03/2019 CLINICAL DATA:  Chest pain EXAM: PORTABLE CHEST 1 VIEW COMPARISON:  CT chest 02/02/2017 FINDINGS: There is no focal consolidation. There is no pleural effusion or pneumothorax. The heart and mediastinal contours are unremarkable. There is evidence of prior aortic valve replacement and median sternotomy. 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 IMPRESSION: No acute cardiopulmonary disease. Electronically Signed   By: Kathreen Devoid   On: 02/03/2019 14:05    Assessment and Plan:   1. NSTEMI: Chest pain started around 9:45 AM this morning and has been  persistent since.  She still have very vague chest discomfort at this time.  Initial troponin elevated at 3600.  Interestingly, she did not have any exertional symptoms prior to the day nor was there any sign of coronary artery disease on previous cardiac catheterization in 2016.  She says she was quite nervous about her dental procedure.  -Obtain echocardiogram.  Trend troponin, consider cardiac catheterization to rule out coronary artery disease   2. Aortic stenosis s/p AVR in July 2016: Repeat echocardiogram  3. Hyperlipidemia: Continue statin therapy  4. History of gastric ulcer  5. Bradycardia: Not on beta-blocker  6. Hyperkalemia: unclear if real, will repeat K once she has midline.   Severity of Illness: The appropriate patient status for this patient is OBSERVATION. Observation status is judged to be reasonable and necessary in order to provide the required intensity of service to ensure the patient's safety. The patient's presenting symptoms, physical exam findings, and initial radiographic and laboratory data in the context of their medical condition is felt to place them at decreased risk for further clinical deterioration. Furthermore, it is anticipated that the patient will be medically stable for discharge from the hospital within 2 midnights of admission. The following factors support the patient status of observation.   "  The patient's presenting symptoms include chest pain. " The physical exam findings include benign. " The initial radiographic and laboratory data are elevated troponin.     For questions or updates, please contact [place] Please consult [email or web address] for contact info under        Hilbert Corrigan, [place]  02/03/2019 5:17 PM

## 2019-02-03 NOTE — ED Triage Notes (Signed)
Pt BIB [facility] from [facility]. Pt states she began having chest pain this morning around 0945. Pt states she felt pressure in the center of her chest and a fuzzy sensation in her left arm. Pt states she returned home and had one bowel movement and one episode of emesis. Pt states she was feeling anxious this morning due to having to go to the dentist.

## 2019-02-03 NOTE — Progress Notes (Signed)
Peripherally Inserted Central Catheter/Midline Placement  The IV Nurse has discussed with the patient and/or persons authorized to consent for the patient, the purpose of this procedure and the potential benefits and risks involved with this procedure.  The benefits include less needle sticks, lab draws from the catheter, and the patient may be discharged home with the catheter. Risks include, but not limited to, infection, bleeding, blood clot (thrombus formation), and puncture of an artery; nerve damage and irregular heartbeat and possibility to perform a PICC exchange if needed/ordered by physician.  Alternatives to this procedure were also discussed.  Bard Power PICC patient education guide, fact sheet on infection prevention and patient information card has been provided to patient /or left at bedside.    PICC/Midline Placement Documentation  PICC Double Lumen 53/61/44 PICC Right Basilic 35 cm 0 cm (Active)  Indication for Insertion or Continuance of Line Poor Vasculature-patient has had multiple peripheral attempts or PIVs lasting less than 24 hours 02/03/19 2109  Exposed Catheter (cm) 0 cm 02/03/19 2109  Site Assessment Clean;Dry;Intact 02/03/19 2109  Lumen #1 Status Flushed;Saline locked;Blood return noted 02/03/19 2109  Lumen #2 Status Flushed;Saline locked;Blood return noted 02/03/19 2109  Dressing Type Transparent 02/03/19 2109  Dressing Status Clean;Dry;Antimicrobial disc in place;Intact 02/03/19 2109  Dressing Change Due 02/10/19 02/03/19 2109       Gordan Payment 02/03/2019, 9:10 PM

## 2019-02-03 NOTE — ED Notes (Signed)
Dinner Tray Ordered @ [phone].

## 2019-02-03 NOTE — Progress Notes (Signed)
ANTICOAGULATION CONSULT NOTE - Initial Consult  Pharmacy Consult for Heparin Indication: chest pain/ACS  Allergies  Allergen Reactions  . Amoxicillin-Pot Clavulanate Hives and Rash  . Penicillin G Rash  . Penicillins Hives and Rash  . Sulfa Antibiotics Rash  . Sulfamethoxazole Rash  . Sulfonamide Derivatives Hives    Patient Measurements: Height: 5\' 5"  (165.1 cm) Weight: 155 lb (70.3 kg) IBW/kg (Calculated) : 57 Heparin Dosing Weight: 70.3 Kg  Vital Signs: Temp: 97.7 F (36.5 C) (10/08 1322) Temp Source: Oral (10/08 1322) BP: 163/69 (10/08 1400) Pulse Rate: 53 (10/08 1400)  Labs: Recent Labs    02/03/19 1515  HGB 12.2  HCT 38.2  PLT PLATELET CLUMPS NOTED ON SMEAR, UNABLE TO ESTIMATE  CREATININE 0.73  TROPONINIHS 3,622*    Estimated Creatinine Clearance: 52.4 mL/min (by C-G formula based on SCr of 0.73 mg/dL).   Medical History: Past Medical History:  Diagnosis Date  . Allergy   . Anemia   . Anxiety   . Aortic valve stenosis, severe   . Breast cancer ([place]) 2007   right  . Cataract   . Hiatal hernia   . Hypercholesteremia   . Hypoglycemia   . Osteopenia   . Personal history of radiation therapy   . Tingling   . Ulcer 2012   bleeding gastric Ulcer    Assessment: Pt is a 85 y/o female who presents after chest discomfort. Troponin's are elevated at 3,622. Pt is not on any anticoagulation PTA. Hg/Hct and plts are wnls. Pharmacy has been consulted to dose heparin.  Goal of Therapy:  Heparin level 0.3-0.7 units/ml Monitor platelets by anticoagulation protocol: Yes   Plan:  Give 4000 units bolus x 1 Start heparin infusion at 850 units/hr Check anti-Xa level in 8 hours and daily while on heparin Continue to monitor H&H and platelets  Monitor for signs/symptoms of bleeding  Sherren Kerns, PharmD PGY1 Acute Care Pharmacy Resident 02/03/2019,4:54 PM

## 2019-02-03 NOTE — Progress Notes (Signed)
This nurse spoke Thurmond Butts and MD at [facility]. Discussed IV access options. MD stated to hold off on PIV start at this time. This nurse suggested PICC line d/t possibly multiple infusions. Fran Lowes, RN VAST

## 2019-02-04 ENCOUNTER — Encounter (HOSPITAL_COMMUNITY): Payer: Self-pay | Admitting: Internal Medicine

## 2019-02-04 ENCOUNTER — Observation Stay (HOSPITAL_COMMUNITY): Payer: Medicare Other

## 2019-02-04 ENCOUNTER — Telehealth: Payer: Self-pay | Admitting: Student

## 2019-02-04 ENCOUNTER — Encounter (HOSPITAL_COMMUNITY)
Admission: EM | Disposition: A | Discharge status: Home / Self Care | Payer: Self-pay | Source: Home / Self Care | Attending: Cardiovascular Disease

## 2019-02-04 DIAGNOSIS — I361 Nonrheumatic tricuspid (valve) insufficiency: Secondary | ICD-10-CM

## 2019-02-04 DIAGNOSIS — I2510 Atherosclerotic heart disease of native coronary artery without angina pectoris: Secondary | ICD-10-CM | POA: Diagnosis not present

## 2019-02-04 DIAGNOSIS — Z20828 Contact with and (suspected) exposure to other viral communicable diseases: Secondary | ICD-10-CM | POA: Diagnosis not present

## 2019-02-04 DIAGNOSIS — Z888 Allergy status to other drugs, medicaments and biological substances status: Secondary | ICD-10-CM | POA: Diagnosis not present

## 2019-02-04 DIAGNOSIS — Z880 Allergy status to penicillin: Secondary | ICD-10-CM | POA: Diagnosis not present

## 2019-02-04 DIAGNOSIS — E7800 Pure hypercholesterolemia, unspecified: Secondary | ICD-10-CM | POA: Diagnosis not present

## 2019-02-04 DIAGNOSIS — I340 Nonrheumatic mitral (valve) insufficiency: Secondary | ICD-10-CM | POA: Diagnosis not present

## 2019-02-04 DIAGNOSIS — R001 Bradycardia, unspecified: Secondary | ICD-10-CM | POA: Diagnosis present

## 2019-02-04 DIAGNOSIS — R7303 Prediabetes: Secondary | ICD-10-CM | POA: Diagnosis not present

## 2019-02-04 DIAGNOSIS — Z8711 Personal history of peptic ulcer disease: Secondary | ICD-10-CM | POA: Diagnosis not present

## 2019-02-04 DIAGNOSIS — I080 Rheumatic disorders of both mitral and aortic valves: Secondary | ICD-10-CM | POA: Diagnosis present

## 2019-02-04 DIAGNOSIS — I10 Essential (primary) hypertension: Secondary | ICD-10-CM | POA: Diagnosis not present

## 2019-02-04 DIAGNOSIS — I110 Hypertensive heart disease with heart failure: Secondary | ICD-10-CM | POA: Diagnosis not present

## 2019-02-04 DIAGNOSIS — R030 Elevated blood-pressure reading, without diagnosis of hypertension: Secondary | ICD-10-CM | POA: Diagnosis not present

## 2019-02-04 DIAGNOSIS — F419 Anxiety disorder, unspecified: Secondary | ICD-10-CM | POA: Diagnosis not present

## 2019-02-04 DIAGNOSIS — I739 Peripheral vascular disease, unspecified: Secondary | ICD-10-CM | POA: Diagnosis present

## 2019-02-04 DIAGNOSIS — Z953 Presence of xenogenic heart valve: Secondary | ICD-10-CM | POA: Diagnosis not present

## 2019-02-04 DIAGNOSIS — E875 Hyperkalemia: Secondary | ICD-10-CM | POA: Diagnosis not present

## 2019-02-04 DIAGNOSIS — M8580 Other specified disorders of bone density and structure, unspecified site: Secondary | ICD-10-CM | POA: Diagnosis not present

## 2019-02-04 DIAGNOSIS — Z923 Personal history of irradiation: Secondary | ICD-10-CM | POA: Diagnosis not present

## 2019-02-04 DIAGNOSIS — I214 Non-ST elevation (NSTEMI) myocardial infarction: Secondary | ICD-10-CM | POA: Diagnosis not present

## 2019-02-04 DIAGNOSIS — I5032 Chronic diastolic (congestive) heart failure: Secondary | ICD-10-CM | POA: Diagnosis present

## 2019-02-04 DIAGNOSIS — Z882 Allergy status to sulfonamides status: Secondary | ICD-10-CM | POA: Diagnosis not present

## 2019-02-04 DIAGNOSIS — I472 Ventricular tachycardia: Secondary | ICD-10-CM | POA: Diagnosis not present

## 2019-02-04 DIAGNOSIS — Z87891 Personal history of nicotine dependence: Secondary | ICD-10-CM | POA: Diagnosis not present

## 2019-02-04 DIAGNOSIS — E785 Hyperlipidemia, unspecified: Secondary | ICD-10-CM | POA: Diagnosis present

## 2019-02-04 DIAGNOSIS — Z853 Personal history of malignant neoplasm of breast: Secondary | ICD-10-CM | POA: Diagnosis not present

## 2019-02-04 DIAGNOSIS — Z79899 Other long term (current) drug therapy: Secondary | ICD-10-CM | POA: Diagnosis not present

## 2019-02-04 HISTORY — PX: CORONARY ANGIOGRAPHY: CATH118303

## 2019-02-04 LAB — CBC
HCT: 33.4 % — ABNORMAL LOW (ref 36.0–46.0)
Hemoglobin: 11 g/dL — ABNORMAL LOW (ref 12.0–15.0)
MCH: 29.1 pg (ref 26.0–34.0)
MCHC: 32.9 g/dL (ref 30.0–36.0)
MCV: 88.4 fL (ref 80.0–100.0)
Platelets: 132 10*3/uL — ABNORMAL LOW (ref 150–400)
RBC: 3.78 MIL/uL — ABNORMAL LOW (ref 3.87–5.11)
RDW: 17 % — ABNORMAL HIGH (ref 11.5–15.5)
WBC: 8 10*3/uL (ref 4.0–10.5)
nRBC: 0 % (ref 0.0–0.2)

## 2019-02-04 LAB — BASIC METABOLIC PANEL
Anion gap: 10 (ref 5–15)
BUN: 11 mg/dL (ref 8–23)
CO2: 22 mmol/L (ref 22–32)
Calcium: 8.5 mg/dL — ABNORMAL LOW (ref 8.9–10.3)
Chloride: 104 mmol/L (ref 98–111)
Creatinine, Ser: 0.77 mg/dL (ref 0.44–1.00)
GFR calc Af Amer: >60 mL/min (ref >60)
GFR calc non Af Amer: >60 mL/min (ref >60)
Glucose, Bld: 109 mg/dL — ABNORMAL HIGH (ref 70–99)
Potassium: 4.2 mmol/L (ref 3.5–5.1)
Sodium: 136 mmol/L (ref 135–145)

## 2019-02-04 LAB — LIPID PANEL
Cholesterol: 159 mg/dL (ref 0–200)
HDL: 76 mg/dL (ref >40)
LDL Cholesterol: 71 mg/dL (ref 0–99)
Total CHOL/HDL Ratio: 2.1 RATIO
Triglycerides: 58 mg/dL (ref <150)
VLDL: 12 mg/dL (ref 0–40)

## 2019-02-04 LAB — ECHOCARDIOGRAM COMPLETE
Height: 65 in
Weight: 2542.4 oz

## 2019-02-04 LAB — HEPARIN LEVEL (UNFRACTIONATED): Heparin Unfractionated: 0.2 IU/mL — ABNORMAL LOW (ref 0.30–0.70)

## 2019-02-04 SURGERY — CORONARY ANGIOGRAPHY (CATH LAB)
Anesthesia: LOCAL

## 2019-02-04 MED ORDER — FENTANYL CITRATE (PF) 100 MCG/2ML IJ SOLN
INTRAMUSCULAR | Status: AC
  Filled 2019-02-04: qty 2

## 2019-02-04 MED ORDER — ENOXAPARIN SODIUM 40 MG/0.4ML ~~LOC~~ SOLN
40.0000 mg | SUBCUTANEOUS | Status: DC
  Administered 2019-02-05: 40 mg via SUBCUTANEOUS
  Filled 2019-02-04: qty 0.4

## 2019-02-04 MED ORDER — LIDOCAINE HCL (PF) 1 % IJ SOLN
INTRAMUSCULAR | Status: AC
  Filled 2019-02-04: qty 30

## 2019-02-04 MED ORDER — HYDRALAZINE HCL 20 MG/ML IJ SOLN: 10.0000 mg | INTRAMUSCULAR | Status: AC | PRN

## 2019-02-04 MED ORDER — LIDOCAINE HCL (PF) 1 % IJ SOLN
INTRAMUSCULAR | Status: DC | PRN
  Administered 2019-02-04: 2 mL

## 2019-02-04 MED ORDER — LABETALOL HCL 5 MG/ML IV SOLN: 10.0000 mg | INTRAVENOUS | Status: AC | PRN

## 2019-02-04 MED ORDER — HEPARIN (PORCINE) IN NACL 1000-0.9 UT/500ML-% IV SOLN
INTRAVENOUS | Status: AC
  Filled 2019-02-04: qty 500

## 2019-02-04 MED ORDER — MIDAZOLAM HCL 2 MG/2ML IJ SOLN
INTRAMUSCULAR | Status: DC | PRN
  Administered 2019-02-04: 1 mg via INTRAVENOUS

## 2019-02-04 MED ORDER — HEPARIN (PORCINE) IN NACL 1000-0.9 UT/500ML-% IV SOLN
INTRAVENOUS | Status: DC | PRN
  Administered 2019-02-04 (×2): 500 mL

## 2019-02-04 MED ORDER — SODIUM CHLORIDE 0.9 % IV SOLN: 250.0000 mL | INTRAVENOUS | Status: DC | PRN

## 2019-02-04 MED ORDER — HEPARIN SODIUM (PORCINE) 1000 UNIT/ML IJ SOLN
INTRAMUSCULAR | Status: AC
  Filled 2019-02-04: qty 1

## 2019-02-04 MED ORDER — VERAPAMIL HCL 2.5 MG/ML IV SOLN
INTRAVENOUS | Status: AC
  Filled 2019-02-04: qty 2

## 2019-02-04 MED ORDER — CLOPIDOGREL BISULFATE 75 MG PO TABS
300.0000 mg | ORAL_TABLET | Freq: Once | ORAL | Status: AC
  Administered 2019-02-04: 300 mg via ORAL
  Filled 2019-02-04: qty 4

## 2019-02-04 MED ORDER — HEPARIN SODIUM (PORCINE) 1000 UNIT/ML IJ SOLN
INTRAMUSCULAR | Status: DC | PRN
  Administered 2019-02-04: 3500 [IU] via INTRAVENOUS

## 2019-02-04 MED ORDER — SODIUM CHLORIDE 0.9% FLUSH: 3.0000 mL | Freq: Two times a day (BID) | INTRAVENOUS | Status: DC

## 2019-02-04 MED ORDER — MIDAZOLAM HCL 2 MG/2ML IJ SOLN
INTRAMUSCULAR | Status: AC
  Filled 2019-02-04: qty 2

## 2019-02-04 MED ORDER — NITROGLYCERIN 1 MG/10 ML FOR IR/CATH LAB
INTRA_ARTERIAL | Status: DC | PRN
  Administered 2019-02-04: 200 ug via INTRACORONARY

## 2019-02-04 MED ORDER — FENTANYL CITRATE (PF) 100 MCG/2ML IJ SOLN
INTRAMUSCULAR | Status: DC | PRN
  Administered 2019-02-04: 25 ug via INTRAVENOUS

## 2019-02-04 MED ORDER — VERAPAMIL HCL 2.5 MG/ML IV SOLN
INTRAVENOUS | Status: DC | PRN
  Administered 2019-02-04: 10 mL via INTRA_ARTERIAL

## 2019-02-04 MED ORDER — SODIUM CHLORIDE 0.9% FLUSH
3.0000 mL | INTRAVENOUS | Status: DC | PRN
  Administered 2019-02-05: 3 mL via INTRAVENOUS
  Filled 2019-02-04: qty 3

## 2019-02-04 MED ORDER — HEPARIN BOLUS VIA INFUSION
1000.0000 [IU] | Freq: Once | INTRAVENOUS | Status: DC
  Filled 2019-02-04: qty 1000

## 2019-02-04 MED ORDER — CLOPIDOGREL BISULFATE 75 MG PO TABS
75.0000 mg | ORAL_TABLET | Freq: Every day | ORAL | Status: DC
  Administered 2019-02-05: 75 mg via ORAL
  Filled 2019-02-04: qty 1

## 2019-02-04 MED ORDER — IOHEXOL 350 MG/ML SOLN
INTRAVENOUS | Status: DC | PRN
  Administered 2019-02-04: 75 mL

## 2019-02-04 MED ORDER — SODIUM CHLORIDE 0.9 % IV SOLN
INTRAVENOUS | Status: AC
  Administered 2019-02-04: 12:00:00 via INTRAVENOUS

## 2019-02-04 SURGICAL SUPPLY — 15 items
CATH 5FR JL3.5 JR4 ANG PIG MP (CATHETERS) ×2 IMPLANT
CATH INFINITI 5FR JL4 (CATHETERS) ×2 IMPLANT
DEVICE RAD COMP TR BAND LRG (VASCULAR PRODUCTS) ×2 IMPLANT
GLIDESHEATH SLEND SS 6F .021 (SHEATH) ×2 IMPLANT
GUIDEWIRE ANGLED .035X150CM (WIRE) ×2 IMPLANT
GUIDEWIRE INQWIRE 1.5J.035X260 (WIRE) ×1 IMPLANT
INQWIRE 1.5J .035X260CM (WIRE) ×2
KIT HEART LEFT (KITS) ×2 IMPLANT
PACK CARDIAC CATHETERIZATION (CUSTOM PROCEDURE TRAY) ×2 IMPLANT
SHEATH PINNACLE 5F 10CM (SHEATH) IMPLANT
SHEATH PROBE COVER 6X72 (BAG) ×2 IMPLANT
TRANSDUCER W/STOPCOCK (MISCELLANEOUS) ×2 IMPLANT
TUBING CIL FLEX 10 FLL-RA (TUBING) ×2 IMPLANT
WIRE EMERALD 3MM-J .035X150CM (WIRE) IMPLANT
WIRE HI TORQ VERSACORE-J 145CM (WIRE) ×2 IMPLANT

## 2019-02-04 NOTE — Progress Notes (Signed)
Arrived for [facility]IV. Noted heparin infusion running through PICC line. Discussed with RN protocol for venipuncture for heparin level due to this. Notified phlebotomist of need for venipuncture for lab work.

## 2019-02-04 NOTE — Interval H&P Note (Signed)
History and Physical Interval Note:  02/04/2019 8:58 AM  [name]  has presented today for cardiac catheterization, with the diagnosis of nstemi.  The various methods of treatment have been discussed with the patient and family. After consideration of risks, benefits and other options for treatment, the patient has consented to  Procedure(s): LEFT HEART CATH AND CORONARY ANGIOGRAPHY (N/A) as a surgical intervention.  The patient's history has been reviewed, patient examined, no change in status, stable for surgery.  I have reviewed the patient's chart and labs.  Questions were answered to the patient's satisfaction.    Cath Lab Visit (complete for each Cath Lab visit)  Clinical Evaluation Leading to the Procedure:   ACS: Yes.    Non-ACS:  N/A  Rayma Hegg

## 2019-02-04 NOTE — Telephone Encounter (Signed)
   FYI - patient may be discharged from hospital tomorrow. I have preemptively made appt for 10/21 with Pecolia Ades. Will need TOC call after dc. Jedadiah Abdallah PA-C

## 2019-02-04 NOTE — Progress Notes (Signed)
ANTICOAGULATION CONSULT NOTE - Initial Consult  Pharmacy Consult for Heparin Indication: chest pain/ACS  Allergies  Allergen Reactions  . Amoxicillin-Pot Clavulanate Hives and Rash  . Penicillin G Rash  . Penicillins Hives and Rash  . Sulfa Antibiotics Rash  . Sulfamethoxazole Rash  . Sulfonamide Derivatives Hives    Patient Measurements: Height: 5\' 5"  (165.1 cm) Weight: 158 lb 14.4 oz (72.1 kg) IBW/kg (Calculated) : 57 Heparin Dosing Weight: 70.3 Kg  Vital Signs: Temp: 98.1 F (36.7 C) (10/09 0519) Temp Source: Oral (10/09 0519) BP: 123/67 (10/09 0519) Pulse Rate: 57 (10/09 0519)  Labs: Recent Labs    02/03/19 1515 02/03/19 2131 02/04/19 0600  HGB 12.2  --  11.0*  HCT 38.2  --  33.4*  PLT PLATELET CLUMPS NOTED ON SMEAR, UNABLE TO ESTIMATE  --  132*  HEPARINUNFRC  --   --  0.20*  CREATININE 0.73  --  0.77  TROPONINIHS 3,622* 18,701*  --     Estimated Creatinine Clearance: 53 mL/min (by C-G formula based on SCr of 0.77 mg/dL).  Assessment: Pt is a 85 y/o female who presents after chest discomfort. Troponin's are elevated at 3,622. Pt is not on any anticoagulation PTA.   HS Trop 3622 >> 18701  Initial Hep lvl low 0.2  CBC stable  Goal of Therapy:  Heparin level 0.3-0.7 units/ml Monitor platelets by anticoagulation protocol: Yes   Plan:  Bolus heparin 1000 units x 1 Increase gtt to 1000 units/hr Re-check lvl at 1500  F/u cards plans for cath  Levester Fresh, PharmD, BCPS, BCCCP Clinical Pharmacist [phone]  Please check AMION for all [place] numbers  02/04/2019 6:58 AM

## 2019-02-04 NOTE — Progress Notes (Signed)
Progress Note  Patient Name: [name] Date of Encounter: 02/04/2019  Primary Cardiologist: [name], MD   Subjective   Feeling well.  Very mild chest discomfort.  Much better than yesterday.   Inpatient Medications    Scheduled Meds: . [START ON 02/05/2019] aspirin EC  81 mg Oral Daily  . Chlorhexidine Gluconate Cloth  6 each Topical Daily  . [START ON 02/05/2019] clopidogrel  75 mg Oral Q breakfast  . [START ON 02/05/2019] enoxaparin (LOVENOX) injection  40 mg Subcutaneous Q24H  . fluticasone  2 spray Each Nare Daily  . magnesium oxide  400 mg Oral Daily  . montelukast  10 mg Oral QHS  . pantoprazole  40 mg Oral BID  . rosuvastatin  10 mg Oral Daily  . sodium chloride flush  10-40 mL Intracatheter Q12H  . sodium chloride flush  3 mL Intravenous Q12H  . sodium chloride flush  3 mL Intravenous Q12H   Continuous Infusions: . sodium chloride 75 mL/hr at 02/04/19 1208  . sodium chloride     PRN Meds: sodium chloride, acetaminophen, ALPRAZolam, hydrALAZINE, labetalol, nitroGLYCERIN, ondansetron (ZOFRAN) IV, sodium chloride flush, sodium chloride flush, zolpidem   Vital Signs    Vitals:   02/04/19 1030 02/04/19 1045 02/04/19 1100 02/04/19 1203  BP: (!) 118/52 (!) 133/46 (!) 141/73 (!) 150/66  Pulse: (!) 54 (!) 55 (!) 55 (!) 57  Resp:      Temp:      TempSrc:      SpO2:      Weight:      Height:        Intake/Output Summary (Last 24 hours) at 02/04/2019 1309 Last data filed at 02/04/2019 1255 Gross per 24 hour  Intake 874.28 ml  Output -  Net 874.28 ml   Last 3 Weights 02/04/2019 02/03/2019 02/03/2019  Weight (lbs) 158 lb 14.4 oz 159 lb 3.2 oz 155 lb  Weight (kg) 72.077 kg 72.213 kg 70.308 kg      Telemetry    Sinus rhythm.  5 beats NSVT - Personally Reviewed  ECG    Sinus rhythm.  Rate 63 bpm.  Anterolateral TWI. Poor R wave progression.  QTC 485 ms. - Personally Reviewed  Physical Exam   VS:  BP (!) 150/66   Pulse (!) 57   Temp 98.1  F (36.7 C) (Oral)   Resp 20   Ht 5\' 5"  (1.651 m)   Wt 72.1 kg   SpO2 99%   BMI 26.44 kg/m  , BMI Body mass index is 26.44 kg/m. GENERAL:  Well appearing.  No acute distress HEENT: Pupils equal round and reactive, fundi not visualized, oral mucosa unremarkable NECK:  No jugular venous distention, waveform within normal limits, carotid upstroke brisk and symmetric, no bruits LUNGS:  Clear to auscultation bilaterally HEART:  RRR.  PMI not displaced or sustained,S1 and S2 within normal limits, no S3, no S4, no clicks, no rubs, no murmurs ABD:  Flat, positive bowel sounds normal in frequency in pitch, no bruits, no rebound, no guarding, no midline pulsatile mass, no hepatomegaly, no splenomegaly EXT:  2 plus pulses throughout, no edema, no cyanosis no clubbing SKIN:  No rashes no nodules NEURO:  Cranial nerves II through XII grossly intact, motor grossly intact throughout PSYCH:  Cognitively intact, oriented to person place and time   Labs    High Sensitivity Troponin:   Recent Labs  Lab 02/03/19 1515 02/03/19 2131  TROPONINIHS 3,622* 18,701*  Chemistry Recent Labs  Lab 02/03/19 1515 02/03/19 2131 02/04/19 0600  NA 130*  --  136  K 5.8* 3.4* 4.2  CL 100  --  104  CO2 18*  --  22  GLUCOSE 125*  --  109*  BUN 16  --  11  CREATININE 0.73  --  0.77  CALCIUM 8.7*  --  8.5*  PROT 5.5*  --   --   ALBUMIN 3.9  --   --   AST 72*  --   --   ALT 24  --   --   ALKPHOS 80  --   --   BILITOT 1.6*  --   --   GFRNONAA >60  --  >60  GFRAA >60  --  >60  ANIONGAP 12  --  10     Hematology Recent Labs  Lab 02/03/19 1515 02/04/19 0600  WBC 9.0 8.0  RBC 4.23 3.78*  HGB 12.2 11.0*  HCT 38.2 33.4*  MCV 90.3 88.4  MCH 28.8 29.1  MCHC 31.9 32.9  RDW 17.2* 17.0*  PLT PLATELET CLUMPS NOTED ON SMEAR, UNABLE TO ESTIMATE 132*    BNPNo results for input(s): BNP, PROBNP in the last 168 hours.   DDimer No results for input(s): DDIMER in the last 168 hours.   Radiology     Dg Chest Port 1 View  Result Date: 02/03/2019 CLINICAL DATA:  Chest pain EXAM: PORTABLE CHEST 1 VIEW COMPARISON:  CT chest 02/02/2017 FINDINGS: There is no focal consolidation. There is no pleural effusion or pneumothorax. The heart and mediastinal contours are unremarkable. There is evidence of prior aortic valve replacement and median sternotomy. 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 IMPRESSION: No acute cardiopulmonary disease. Electronically Signed   By: Kathreen Devoid   On: 02/03/2019 14:05   [place] Ekg Site Rite  Result Date: 02/03/2019 If Site Rite image not attached, placement could not be confirmed due to current cardiac rhythm.   Cardiac Studies   LHC 02/04/19:  Conclusions: 1. Severe single-vessel coronary artery disease with occlusion of the distal LAD.  Otherwise, there is mild disease involving the proximal LAD and ostial diagonal branch.  Question if this represents acute plaque rupture versus coronary embolism. 2. No significant CAD involving the LCx and RCA.  Recommendations: 1. Medical therapy including dual antiplatelet therapy with aspirin and clopidogrel for up to 12 months.  Distal LAD at site of occlusion is very small and not well-suited for stent placement. 2. Aggressive secondary prevention. 3. Follow-up echocardiogram with particular attention to aortic valve prosthesis to exclude thrombus.  Diagnostic Dominance: Right    Patient Profile     Ms. Sheetz is an 70F with hyperlipidemia, prior gastric ulcer, urticaria, and aortic stenosis status post bioprosthetic aortic valve replacement 10/2014 admitted with chest pain and NSTEMI.  Assessment & Plan    # NSTEMI: Ms. Fuston presented with chest pain.  She had distal LAD disease and minimal proximal stenosis.  It was though to be possibly embolic.  Echo pending.  Continue DAPT for 12 months with aspirin and clopidogrel.  No beta blocker 2/2 bradycardia. Consider adding ARB pending echo  results.   LDL was 71 on admission.  Increase home rosuvastatin to 20mg . Repeat lipids/CMP in 6-8 weeks.   # Elevated BP: BP has been elevated.  She notes that this typically happens when she is stressed.  We will plan for her to track it at home and bring BP to follow up.  #  Aortic stenosis s/p AVR:  No evidence of valvular dysfunction.  Echo pending.  No heart failure symptoms.    # Dispo: Likely home in the AM.   For questions or updates, please contact [place] Please consult [email or web address] for contact info under        Signed, Skeet Latch, MD  02/04/2019, 1:09 PM

## 2019-02-04 NOTE — Progress Notes (Signed)
Obtained instructions from Cardio on call to hold Heparin and have IV team draw heparin level from PICC. IV called and order in computer.

## 2019-02-04 NOTE — Plan of Care (Signed)
  Problem: Education: Goal: Knowledge of General Education information will improve Description: Including pain rating scale, medication(s)/side effects and non-pharmacologic comfort measures Outcome: Progressing   Problem: Health Behavior/Discharge Planning: Goal: Ability to manage health-related needs will improve Outcome: Progressing   Problem: Clinical Measurements: Goal: Respiratory complications will improve Outcome: Progressing   Problem: Clinical Measurements: Goal: Diagnostic test results will improve Outcome: Progressing   Problem: Pain Managment: Goal: General experience of comfort will improve Outcome: Progressing   Problem: Education: Goal: Understanding of cardiac disease, CV risk reduction, and recovery process will improve Outcome: Progressing

## 2019-02-04 NOTE — Progress Notes (Signed)
  Echocardiogram 2D Echocardiogram has been performed.  [name] M 02/04/2019, 11:34 AM

## 2019-02-04 NOTE — Progress Notes (Signed)
In anticipation of dc tomorrow, I scheduled TOC f/u appt for 10/21 at 9am with Pecolia Ades - request sent to [facility] pool for call. Dayna Dunn PA-C

## 2019-02-05 DIAGNOSIS — I10 Essential (primary) hypertension: Secondary | ICD-10-CM

## 2019-02-05 LAB — HEPATIC FUNCTION PANEL
ALT: 29 U/L (ref 0–44)
AST: 64 U/L — ABNORMAL HIGH (ref 15–41)
Albumin: 3.6 g/dL (ref 3.5–5.0)
Alkaline Phosphatase: 83 U/L (ref 38–126)
Bilirubin, Direct: 0.2 mg/dL (ref 0.0–0.2)
Indirect Bilirubin: 0.6 mg/dL (ref 0.3–0.9)
Total Bilirubin: 0.8 mg/dL (ref 0.3–1.2)
Total Protein: 5.7 g/dL — ABNORMAL LOW (ref 6.5–8.1)

## 2019-02-05 LAB — CBC
HCT: 36.3 % (ref 36.0–46.0)
Hemoglobin: 11.5 g/dL — ABNORMAL LOW (ref 12.0–15.0)
MCH: 28.5 pg (ref 26.0–34.0)
MCHC: 31.7 g/dL (ref 30.0–36.0)
MCV: 89.9 fL (ref 80.0–100.0)
Platelets: 149 10*3/uL — ABNORMAL LOW (ref 150–400)
RBC: 4.04 MIL/uL (ref 3.87–5.11)
RDW: 17.3 % — ABNORMAL HIGH (ref 11.5–15.5)
WBC: 9 10*3/uL (ref 4.0–10.5)
nRBC: 0 % (ref 0.0–0.2)

## 2019-02-05 LAB — TROPONIN I (HIGH SENSITIVITY): Troponin I (High Sensitivity): 6635 ng/L (ref <18)

## 2019-02-05 MED ORDER — NITROGLYCERIN 0.4 MG SL SUBL: 0.4000 mg | SUBLINGUAL_TABLET | SUBLINGUAL | 3 refills | Status: DC | PRN

## 2019-02-05 MED ORDER — CLOPIDOGREL BISULFATE 75 MG PO TABS: 75.0000 mg | ORAL_TABLET | Freq: Every day | ORAL | 3 refills | Status: DC

## 2019-02-05 MED ORDER — LOSARTAN POTASSIUM 25 MG PO TABS
25.0000 mg | ORAL_TABLET | Freq: Every day | ORAL | Status: DC
  Administered 2019-02-05: 25 mg via ORAL
  Filled 2019-02-05: qty 1

## 2019-02-05 MED ORDER — ROSUVASTATIN CALCIUM 20 MG PO TABS: 10.0000 mg | ORAL_TABLET | Freq: Every day | ORAL | 3 refills | Status: DC

## 2019-02-05 MED ORDER — LOSARTAN POTASSIUM 25 MG PO TABS: 25.0000 mg | ORAL_TABLET | Freq: Every day | ORAL | 3 refills | Status: DC

## 2019-02-05 MED ORDER — ASPIRIN 81 MG PO TBEC: 81.0000 mg | DELAYED_RELEASE_TABLET | Freq: Every day | ORAL | 3 refills | Status: DC

## 2019-02-05 NOTE — Progress Notes (Addendum)
CARDIAC REHAB PHASE I   PRE:  Rate/Rhythm: 65 SR  BP:  Sitting: 148/64        SaO2: 96% RA  MODE:  Ambulation: 120 ft   POST:  Rate/Rhythm: 99 SR  BP:  Sitting: 138/73      SaO2: 96% RA  Pt ambulated 120 ft well with one assist. Pt denied SOB or CP. Pt states she is very active at home walking 30 minutes per day. Exercise guidelines were given to Pt for return home. Educated on risk factors, nutrition, exercise guidelines, NTG use, MI booklet, incision care, CRP II, and restrictions. Pt referred to CRP II [place].   [id number]  Jeralyn Bennett BS, ACSM CEP 02/05/2019  9:09 AM

## 2019-02-05 NOTE — Discharge Summary (Addendum)
Discharge Summary    Patient ID: [name] MRN: [id number]; DOB: 09/14/1932  Admit date: 02/03/2019 Discharge date: 02/05/2019  Primary Care Provider: [name], MD  Primary Cardiologist: [name], MD  Primary Electrophysiologist:  None   Discharge Diagnoses    Principal Problem:   NSTEMI (non-ST elevated myocardial infarction) [facility]) Active Problems:   Hyperlipidemia   Mitral valve disease   Aortic valve stenosis, severe   S/P AVR   Branch retinal vein occlusion   Prediabetes   Small vessel disease, cerebrovascular   Syncope   Allergies Allergies  Allergen Reactions  . Amoxicillin-Pot Clavulanate Hives and Rash  . Penicillin G Rash  . Penicillins Hives and Rash  . Sulfa Antibiotics Rash  . Sulfamethoxazole Rash  . Sulfonamide Derivatives Hives    Diagnostic Studies/Procedures    LHC 02/04/19: Conclusions: 1. Severe single-vessel coronary artery disease with occlusion of the distal LAD. Otherwise, there is mild disease involving the proximal LAD and ostial diagonal branch. Question if this represents acute plaque rupture versus coronary embolism. 2. No significant CAD involving the LCx and RCA.  Recommendations: 1. Medical therapy including dual antiplatelet therapy with aspirin and clopidogrel for up to 12 months. Distal LAD at site of occlusion is very small and not well-suited for stent placement. 2. Aggressive secondary prevention. 3. Follow-up echocardiogram with particular attention to aortic valve prosthesis to exclude thrombus.  Diagnostic Dominance: Right   _____________   Echo 02/04/19:  1. Left ventricular ejection fraction, by visual estimation, is 45 to 50%. The left ventricle has mildly decreased function. Normal left ventricular size. There is mildly increased left ventricular hypertrophy.  2. Apical septal segment and apex are abnormal.  3. Left ventricular diastolic Doppler parameters are consistent with  pseudonormalization pattern of LV diastolic filling.  4. Global right ventricle has normal systolic function.The right ventricular size is normal. No increase in right ventricular wall thickness.  5. Left atrial size was mildly dilated.  6. Right atrial size was normal.  7. The mitral valve is normal in structure. Mild mitral valve regurgitation. No evidence of mitral stenosis.  8. The tricuspid valve is normal in structure. Tricuspid valve regurgitation severe.  9. Aortic valve regurgitation is moderate by color flow Doppler. Mild aortic valve sclerosis without stenosis. 10. Peak transaortic valve velocity 2.7m/s, mean gradient 78mmHg. 11. The pulmonic valve was normal in structure. Pulmonic valve regurgitation is not visualized by color flow Doppler. 12. Moderately elevated pulmonary artery systolic pressure. 13. The inferior vena cava is normal in size with greater than 50% respiratory variability, suggesting right atrial pressure of 3 mmHg. 14. Aortic Valve: A 40mm Magna bioprosthetic aortic valve Procedure Date: 11/16/2014.   History of Present Illness     [name] is a 85 y.o. female with hyperlipidemia, history of gastric ulcer, bradycardia, and a history of aortic stenosis s/p AVR in July 2016 presented with chest pain.   Ms. Common is a 85 year old female with past medical history of hyperlipidemia, history of gastric ulcer, bradycardia, and a history of aortic stenosis s/p AVR in July 2016.  Cardiac catheterization performed prior to AVR was normal.  Her last office visit with Dr. Burt Knack was in May 2018 at which time she was doing well.  Last echocardiogram performed on 12/15/2017 showed EF 65 to 70%, grade 1 DD, bioprosthetic aortic valve, mild MR.  Patient has been residing in [place].  She normally is quite active at the [facility] and able to exercise 30 minutes at a  time without any issue.  Her exercise mainly involves walking.   She was in her usual state of  health until this morning.  She started having chest pressure across the front of her chest 15 minutes prior to 10 AM.  She says she was due to go to her dentist office.  She was very nervous about it.  She was instructed to take antibiotic prior to the dental procedure however did not take her SBE prophylaxis until roughly 10 minutes prior to the procedure.  Since this morning, her chest pain has not completely went away.  She eventually sought medical attention at [facility].  Initial lab work was significant for potassium of 5.8.  First high-sensitivity troponin was 3622.  EKG showed a sinus bradycardia without significant ST-T wave changes. She was admitted to cardiology for possible NSTEMI.    Hospital Course     Consultants: none  NSTEMI HS troponin initially elevated at 3600. Troponin 3622 --> 18701 --> 6635. She underwent heart catheterization on 02/04/19 that revealed 100% stenosis in the distal LAD thought to be thrombotic. Medical therapy was recommended. ASA and plavix x 12 months. BB held for bradycardia.     Mildly reduced EF Chronic Diastolic heart failure Echo this admission with EF of 45-50% with mild increase in left ventricular hypertrophy and diastolic dysfunction.  Losartan added as below.    AS s/p AVR 10/2014 Echocardiogram with AVR mean gradient 12 mmHg. Mild aortic valve sclerosis without stenosis.    Hypertension She states her BP is elevated during times of stress. Losartan 25 mg added. She will keep a BP log at home and bring to [facility] appt.    Hyperlipidemia 02/04/2019: Cholesterol 159; HDL 76; LDL Cholesterol 71; Triglycerides 58; VLDL 12 Crestor increased to 20 mg.   Bradycardia No BB.   Pt was seen and examined by Dr. Gardiner Rhyme and deemed stable for discharge. Follow up has been made.   Did the patient have an acute coronary syndrome (MI, NSTEMI, STEMI, etc) this admission?:  Yes                               AHA/ACC Clinical Performance &  Quality Measures: 1. Aspirin prescribed? - Yes 2. ADP Receptor Inhibitor (Plavix/Clopidogrel, Brilinta/Ticagrelor or Effient/Prasugrel) prescribed (includes medically managed patients)? - Yes 3. Beta Blocker prescribed? - No - bradycardia3 4. High Intensity Statin (Lipitor 40-80mg  or Crestor 20-40mg ) prescribed? - Yes 5. EF assessed during THIS hospitalization? - Yes 6. For EF <40%, was ACEI/ARB prescribed? - Yes 7. For EF <40%, Aldosterone Antagonist (Spironolactone or Eplerenone) prescribed? - No - Reason:  Ef 45-50% 8. Cardiac Rehab Phase II ordered (Included Medically managed Patients)? - Yes   _____________  Discharge Vitals Blood pressure (!) 160/84, pulse 76, temperature 98.2 F (36.8 C), temperature source Oral, resp. rate 17, height 5\' 5"  (1.651 m), weight 70.8 kg, SpO2 98 %.  Filed Weights   02/03/19 2142 02/04/19 0208 02/05/19 0334  Weight: 72.2 kg 72.1 kg 70.8 kg    Labs & Radiologic Studies    CBC Recent Labs    02/03/19 1515 02/04/19 0600 02/05/19 0421  WBC 9.0 8.0 9.0  NEUTROABS 7.2  --   --   HGB 12.2 11.0* 11.5*  HCT 38.2 33.4* 36.3  MCV 90.3 88.4 89.9  PLT PLATELET CLUMPS NOTED ON SMEAR, UNABLE TO ESTIMATE 132* 811*   Basic Metabolic Panel Recent Labs    02/03/19  1515 02/03/19 2131 02/04/19 0600  NA 130*  --  136  K 5.8* 3.4* 4.2  CL 100  --  104  CO2 18*  --  22  GLUCOSE 125*  --  109*  BUN 16  --  11  CREATININE 0.73  --  0.77  CALCIUM 8.7*  --  8.5*   Liver Function Tests Recent Labs    02/03/19 1515 02/05/19 0421  AST 72* 64*  ALT 24 29  ALKPHOS 80 83  BILITOT 1.6* 0.8  PROT 5.5* 5.7*  ALBUMIN 3.9 3.6   No results for input(s): LIPASE, AMYLASE in the last 72 hours. High Sensitivity Troponin:   Recent Labs  Lab 02/03/19 1515 02/03/19 2131 02/05/19 0421  TROPONINIHS 3,622* 18,701* 6,635*    BNP Invalid input(s): POCBNP D-Dimer No results for input(s): DDIMER in the last 72 hours. Hemoglobin A1C No results for input(s):  HGBA1C in the last 72 hours. Fasting Lipid Panel Recent Labs    02/04/19 0600  CHOL 159  HDL 76  LDLCALC 71  TRIG 58  CHOLHDL 2.1   Thyroid Function Tests No results for input(s): TSH, T4TOTAL, T3FREE, THYROIDAB in the last 72 hours.  Invalid input(s): FREET3 _____________  Dg Chest Port 1 View  Result Date: 02/03/2019 CLINICAL DATA:  Chest pain EXAM: PORTABLE CHEST 1 VIEW COMPARISON:  CT chest 02/02/2017 FINDINGS: There is no focal consolidation. There is no pleural effusion or pneumothorax. The heart and mediastinal contours are unremarkable. There is evidence of prior aortic valve replacement and median sternotomy. 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 IMPRESSION: No acute cardiopulmonary disease. Electronically Signed   By: Kathreen Devoid   On: 02/03/2019 14:05   [place] Ekg Site Rite  Result Date: 02/03/2019 If Site Rite image not attached, placement could not be confirmed due to current cardiac rhythm.  Disposition   Pt is being discharged home today in good condition.  Follow-up Plans & Appointments    Follow-up Information    Daune Perch, NP Follow up.   Specialty: Cardiology Why: [facility] - see appointment information below for 10/21 at 9am with Pecolia Ades. Gae Bon is one of our nurse practitioners that works closely with Dr. Burt Knack and our cardiology team. Contact information: [place] [address] [phone]          Discharge Instructions    AMB Referral to Cardiac Rehabilitation - Phase II   Complete by: As directed    Diagnosis: NSTEMI   After initial evaluation and assessments completed: Virtual Based Care may be provided alone or in conjunction with Phase 2 Cardiac Rehab based on patient barriers.: Yes   Diet - low sodium heart healthy   Complete by: As directed    Discharge instructions   Complete by: As directed    No driving for 2 days. No lifting over 5 lbs for 1 week. No sexual activity  for 1 week. Keep procedure site clean & dry. If you notice increased pain, swelling, bleeding or pus, call/return!  You may shower, but no soaking baths/hot tubs/pools for 1 week.   Increase activity slowly   Complete by: As directed       Discharge Medications   Allergies as of 02/05/2019      Reactions   Amoxicillin-pot Clavulanate Hives, Rash   Penicillin G Rash   Penicillins Hives, Rash   Sulfa Antibiotics Rash   Sulfamethoxazole Rash   Sulfonamide Derivatives Hives  Medication List    TAKE these medications   acetaminophen 500 MG tablet Commonly known as: TYLENOL Take 500-1,000 mg by mouth at bedtime as needed for mild pain or moderate pain.   ALPRAZolam 0.5 MG tablet Commonly known as: XANAX TAKE ONE TABLET BY MOUTH DAILY AS NEEDED FOR ANXIETY *DO NOT TAKE AT NIGHT* What changed:   how much to take  how to take this  when to take this  reasons to take this   aspirin 81 MG EC tablet Take 1 tablet (81 mg total) by mouth daily. Start taking on: February 06, 2019   BENEFIBER DRINK MIX PO Take 5 mLs by mouth every morning. Mix with 1/2 a glass water, and takes with vitamins   Calcium 600+D3 600-400 MG-UNIT Tabs Generic drug: Calcium Carb-Cholecalciferol Take 1 tablet by mouth daily.   Chloral Hydrate Crys Take 10 mLs by mouth at bedtime as needed (Call  liquid into [place]).   clindamycin 300 MG capsule Commonly known as: CLEOCIN Take 300 mg by mouth as directed. Take 600 mg (2 capsules) by mouth one hour prior to dental procedures   clopidogrel 75 MG tablet Commonly known as: PLAVIX Take 1 tablet (75 mg total) by mouth daily with breakfast. Start taking on: February 06, 2019   CO Q 10 PO Take 1 capsule by mouth daily.   cyanocobalamin 2000 MCG tablet Take 1 tablet (2,000 mcg total) by mouth daily.   ferrous gluconate 324 MG tablet Commonly known as: FERGON Take 1 tablet (324 mg total) by mouth daily with breakfast.    fluticasone 50 MCG/ACT nasal spray Commonly known as: FLONASE Place 2 sprays into both nostrils daily.   levocetirizine 5 MG tablet Commonly known as: XYZAL   losartan 25 MG tablet Commonly known as: COZAAR Take 1 tablet (25 mg total) by mouth daily. Start taking on: February 06, 2019   Magnesium 400 MG Caps Take 400 mg by mouth every morning.   montelukast 10 MG tablet Commonly known as: SINGULAIR   nitroGLYCERIN 0.4 MG SL tablet Commonly known as: NITROSTAT Place 1 tablet (0.4 mg total) under the tongue every 5 (five) minutes as needed for chest pain (CP or SOB).   OCUSOFT EYE [place] OP Apply 1 application to eye 2 (two) times daily as needed (dry eyes). Morning and before bed   pantoprazole 40 MG tablet Commonly known as: PROTONIX Take 1 tablet (40 mg total) by mouth 2 (two) times daily.   rosuvastatin 20 MG tablet Commonly known as: CRESTOR Take 0.5 tablets (10 mg total) by mouth daily. What changed: medication strength   Systane Ultra PF 0.4-0.3 % Soln Generic drug: Polyethyl Glycol-Propyl Glycol Apply 1 drop to eye 2 (two) times daily.   Vitamin D-3 25 MCG (1000 UT) Caps Take 1 capsule by mouth daily.          Outstanding Labs/Studies   TOC  Duration of Discharge Encounter   Greater than 30 minutes including physician time.  Signed, [place], PA 02/05/2019, 8:33 AM    Patient seen and examined.  Agree with above documentation.  Patient is an 85 year old woman with history of hyperlipidemia, gastric ulcer, bradycardia, aortic stenosis status post AVR in July 2016 resented with chest pain.  Found to have an NSTEMI with high-sensitivity troponin peaking at 18,000.  She underwent a heart catheterization on 02/04/2019 which showed on a percent stenosis in the distal LAD.  Medical management was recommended.  We will plan aspirin and  Plavix for 12 months.  TTE showed EF 45 to 50%.  She was started on losartan 25 mg daily.  Was not started on a  beta-blocker due to baseline bradycardia.  Continued on Crestor.  This morning she denies any chest pain.  On exam today:  GEN: Well nourished, well developed, in no acute distress HEENT: normal Neck: no JVD Cardiac: RRR; no murmurs Respiratory:  clear to auscultation bilaterally, normal work of breathing GI: soft, nontender MS: no deformity or atrophy Skin: warm and dry, no rash Neuro:  Alert and Oriented x 3, Strength and sensation are intact Psych: normal affect  Donato Heinz, MD

## 2019-02-06 ENCOUNTER — Encounter: Payer: Self-pay | Admitting: Internal Medicine

## 2019-02-07 ENCOUNTER — Telehealth: Payer: Self-pay | Admitting: *Deleted

## 2019-02-07 NOTE — Telephone Encounter (Signed)
Pt was on TCM report admitted 02/03/19 for NSTEMI (non-ST elevated myocardial infarction). Medical therapy including dual antiplatelet therapy with aspirin and clopidogrel for up to 12 months. Distal LAD at site of occlusion is very small and not well-suited for stent placement. Pt D/C 02/05/19, and will follow=up w/cardiology Daune Perch, NP (Cardiology); [facility] -02/16/19.Marland KitchenJohny Chess

## 2019-02-07 NOTE — Telephone Encounter (Signed)
Patient contacted regarding discharge from [facility] on 02/05/2019.  Patient understands to follow up with provider Daune Perch, NP on 02/16/2019  at 9:00 at [facility] in [place], [place], [address].  The pt states that her hospital f/u was scheduled without her agreement/knowledge and she is not at all happy about that as she was on hold for over a half an hour waiting to re-schedule to a time that works for her. I apologized for her inconvenience and asked if her appointment was re-scheduled to her satisfaction and she stated that it was.  Patient understands discharge instructions? Yes Patient understands medications and regiment? Yes Patient understands to bring all medications to this visit? Yes

## 2019-02-09 ENCOUNTER — Other Ambulatory Visit: Payer: Self-pay

## 2019-02-09 ENCOUNTER — Encounter: Payer: Self-pay | Admitting: Internal Medicine

## 2019-02-09 ENCOUNTER — Telehealth (HOSPITAL_COMMUNITY): Payer: Self-pay

## 2019-02-09 MED ORDER — CLOPIDOGREL BISULFATE 75 MG PO TABS: 75.0000 mg | ORAL_TABLET | Freq: Every day | ORAL | 3 refills | Status: DC

## 2019-02-09 MED ORDER — ALPRAZOLAM 0.5 MG PO TABS: ORAL_TABLET | ORAL | 0 refills | Status: DC

## 2019-02-09 NOTE — Telephone Encounter (Signed)
Pt returned CR phone and stated she does not wish to participate at this time. She is working out at home.  Closed referral

## 2019-02-09 NOTE — Telephone Encounter (Signed)
Last OV 12/23/18 Next OV 06/23/19 Last RF 12/13/18

## 2019-02-11 ENCOUNTER — Encounter: Payer: Self-pay | Admitting: Internal Medicine

## 2019-02-16 ENCOUNTER — Other Ambulatory Visit: Payer: Self-pay

## 2019-02-16 ENCOUNTER — Ambulatory Visit: Payer: Medicare Other | Admitting: Cardiology

## 2019-02-16 MED ORDER — FERROUS GLUCONATE 324 (38 FE) MG PO TABS: 324.0000 mg | ORAL_TABLET | Freq: Every day | ORAL | 0 refills | Status: DC

## 2019-02-17 ENCOUNTER — Other Ambulatory Visit: Payer: Self-pay

## 2019-02-17 ENCOUNTER — Encounter: Payer: Self-pay | Admitting: Cardiology

## 2019-02-17 ENCOUNTER — Ambulatory Visit (INDEPENDENT_AMBULATORY_CARE_PROVIDER_SITE_OTHER): Payer: Medicare Other | Admitting: Cardiology

## 2019-02-17 VITALS — BP 130/70 | HR 59 | Ht 65.0 in | Wt 160.0 lb

## 2019-02-17 DIAGNOSIS — I214 Non-ST elevation (NSTEMI) myocardial infarction: Secondary | ICD-10-CM

## 2019-02-17 DIAGNOSIS — I10 Essential (primary) hypertension: Secondary | ICD-10-CM | POA: Diagnosis not present

## 2019-02-17 DIAGNOSIS — Z952 Presence of prosthetic heart valve: Secondary | ICD-10-CM

## 2019-02-17 DIAGNOSIS — E785 Hyperlipidemia, unspecified: Secondary | ICD-10-CM

## 2019-02-17 DIAGNOSIS — I5032 Chronic diastolic (congestive) heart failure: Secondary | ICD-10-CM | POA: Diagnosis not present

## 2019-02-17 DIAGNOSIS — R001 Bradycardia, unspecified: Secondary | ICD-10-CM

## 2019-02-17 NOTE — Patient Instructions (Addendum)
Medication Instructions:  Your physician recommends that you continue on your current medications as directed. Please refer to the Current Medication list given to you today.  *If you need a refill on your cardiac medications before your next appointment, please call your pharmacy*  Lab Work: None   If you have labs (blood work) drawn today and your tests are completely normal, you will receive your results only by: Marland Kitchen MyChart Message (if you have MyChart) OR . A paper copy in the mail If you have any lab test that is abnormal or we need to change your treatment, we will call you to review the results.  Testing/Procedures: None   Follow-Up: At [facility], you and your health needs are our priority.  As part of our continuing mission to provide you with exceptional heart care, we have created designated Provider Care Teams.  These Care Teams include your primary Cardiologist (physician) and Advanced Practice Providers (APPs -  Physician Assistants and Nurse Practitioners) who all work together to provide you with the care you need, when you need it.  Your next appointment:   6 months  The format for your next appointment:   In Person  Provider:   You may see [name], MD or one of the following Advanced Practice Providers on your designated Care Team:    Richardson Dopp, PA-C  [place], [place]  Daune Perch, NP   Other Instructions  Lifestyle Modifications to Prevent and Treat Heart Disease -Recommend heart healthy/Mediterranean diet, with whole grains, fruits, vegetables, fish, lean meats, nuts, olive oil and avocado oil.  -Limit salt intake to less than 2000 mg per day.  -Recommend moderate walking, starting slowly with a few minutes and working up to 3-5 times/week for 30-50 minutes each session. Aim for at least 150 minutes.week. Goal should be pace of 3 miles/hours, or walking 1.5 miles in 30 minutes -Recommend avoidance of tobacco products. Avoid excess  alcohol. -Keep blood pressure well controlled, ideally less than 130/80.

## 2019-02-17 NOTE — Progress Notes (Signed)
Cardiology Office Note:    Date:  02/17/2019   ID:  [name], DOB 04/11/1933, MRN [id number]  PCP:  [name], MD  Cardiologist:  [name], MD  Referring MD: [name], MD   Chief Complaint  Patient presents with  . Hospitalization Follow-up    NSTEMI/Post cath    History of Present Illness:    [name] is a 85 y.o. female with a past medical history significant for hyperlipidemia, history of gastric ulcer, bradycardia, and a history of aortic stenosis s/pAVR in July 2016.  Cardiac catheterization performed prior to AVR was normal.  Last echo in 11/2017 showed normal EF 10-25%, grade 1 diastolic dysfunction, bioprosthetic aortic valve, mild MR.  The patient resides in [place], independent living.   Resented to the hospital on 02/03/2019 after experiencing 15 minutes of chest pressure.  High-sensitivity troponin was elevated with a peak of 18,701.  EKG showed sinus bradycardia without significant ST-T changes.  She underwent heart catheterization on 02/04/2019 that revealed 100% stenosis of the distal LAD thought to be thrombotic.  The vessel was too small for intervention.  Medical therapy was recommended with aspirin and Plavix for 12 months.  Beta-blocker was held due to bradycardia.  Echocardiogram on 02/04/2019 showed mildly reduced EF of 45-50% with mild increase in LVH and diastolic dysfunction.  Losartan was added.  LDL was 71 and in setting of current MI Crestor was increased to 20 mg daily.  [name] is her today for hospital follow up. She is living with her 83 year old husband who can be difficult. She feels like she is having excess stress that caused all of her symptoms. She says that she was very stressed on the day of her MI. She had been going to the dentist and had forgot her SBE med which greatly stressed her. She feels that she gets more agitated about things in recent years.   Since the hospital she has been wlking,  building up and is up to 10 minutes 3 times per day with a goal of 30 minutes. She denies chest pain, shortness of breath, orthopnea, PND, palpitations or lightheadedness.   She is signed up to taking a course in transidental meditation with a trainer. She is very hopeful that this will help her handle stress better. Today she is very talkative and pleasant.   Home BP's 120's-140/50's-70   Cardiac studies     LHC 02/04/19: Conclusions: 1. Severe single-vessel coronary artery disease with occlusion of the distal LAD. Otherwise, there is mild disease involving the proximal LAD and ostial diagonal branch. Question if this represents acute plaque rupture versus coronary embolism. 2. No significant CAD involving the LCx and RCA.  Recommendations: 1. Medical therapy including dual antiplatelet therapy with aspirin and clopidogrel for up to 12 months. Distal LAD at site of occlusion is very small and not well-suited for stent placement. 2. Aggressive secondary prevention. 3. Follow-up echocardiogram with particular attention to aortic valve prosthesis to exclude thrombus.  Diagnostic Dominance: Right   _____________   Echo 02/04/19: 1. Left ventricular ejection fraction, by visual estimation, is 45 to 50%. The left ventricle has mildly decreased function. Normal left ventricular size. There is mildly increased left ventricular hypertrophy. 2. Apical septal segment and apex are abnormal. 3. Left ventricular diastolic Doppler parameters are consistent with pseudonormalization pattern of LV diastolic filling. 4. Global right ventricle has normal systolic function.The right ventricular size is normal. No increase in right ventricular wall thickness.  5. Left atrial size was mildly dilated. 6. Right atrial size was normal. 7. The mitral valve is normal in structure. Mild mitral valve regurgitation. No evidence of mitral stenosis. 8. The tricuspid valve is normal in structure.  Tricuspid valve regurgitation severe. 9. Aortic valve regurgitation is moderate by color flow Doppler. Mild aortic valve sclerosis without stenosis. 10. Peak transaortic valve velocity 2.68m/s, mean gradient 48mmHg. 11. The pulmonic valve was normal in structure. Pulmonic valve regurgitation is not visualized by color flow Doppler. 12. Moderately elevated pulmonary artery systolic pressure. 13. The inferior vena cava is normal in size with greater than 50% respiratory variability, suggesting right atrial pressure of 3 mmHg. 14. Aortic Valve: A 65mm Magna bioprosthetic aortic valve Procedure Date: 11/16/2014.   Past Medical History:  Diagnosis Date  . Allergy   . Anemia   . Anxiety   . Aortic valve stenosis, severe   . Breast cancer ([place]) 2007   right  . Cataract   . Hiatal hernia   . Hypercholesteremia   . Hypoglycemia   . Osteopenia   . Personal history of radiation therapy   . Tingling   . Ulcer 2012   bleeding gastric Ulcer    Past Surgical History:  Procedure Laterality Date  . AORTIC VALVE REPLACEMENT N/A 11/16/2014   Procedure: AORTIC VALVE REPLACEMENT (AVR);  Surgeon: Gaye Pollack, MD;  Location: [facility];  Service: Open Heart Surgery;  Laterality: N/A;  . BIOPSY  12/15/2017   Procedure: BIOPSY;  Surgeon: Rush Landmark Telford Nab., MD;  Location: [facility];  Service: Gastroenterology;;  . BREAST BIOPSY    . BREAST LUMPECTOMY Right    2005?  Marland Kitchen BREAST SURGERY    . CARDIAC CATHETERIZATION N/A 10/18/2014   Procedure: Right/Left Heart Cath and Coronary Angiography;  Surgeon: [name], MD;  Location: [facility];  Service: Cardiovascular;  Laterality: N/A;  . CORONARY ANGIOGRAPHY N/A 02/04/2019   Procedure: CORONARY ANGIOGRAPHY;  Surgeon: Nelva Bush, MD;  Location: [facility] [facility];  Service: Cardiovascular;  Laterality: N/A;  . COSMETIC SURGERY     face  . ESOPHAGOGASTRODUODENOSCOPY (EGD) WITH PROPOFOL N/A 12/15/2017   Procedure:  ESOPHAGOGASTRODUODENOSCOPY (EGD) WITH PROPOFOL;  Surgeon: Rush Landmark Telford Nab., MD;  Location: [place];  Service: Gastroenterology;  Laterality: N/A;  . PARATHYROIDECTOMY    . TEE WITHOUT CARDIOVERSION N/A 11/16/2014   Procedure: TRANSESOPHAGEAL ECHOCARDIOGRAM (TEE);  Surgeon: Gaye Pollack, MD;  Location: [facility];  Service: Open Heart Surgery;  Laterality: N/A;  . TONSILLECTOMY      Current Medications: Current Meds  Medication Sig  . acetaminophen (TYLENOL) 500 MG tablet Take 500-1,000 mg by mouth at bedtime as needed for mild pain or moderate pain.  Marland Kitchen ALPRAZolam (XANAX) 0.5 MG tablet TAKE ONE TABLET BY MOUTH DAILY AS NEEDED FOR ANXIETY *DO NOT TAKE AT NIGHT*  . aspirin EC 81 MG EC tablet Take 1 tablet (81 mg total) by mouth daily.  . Calcium Carb-Cholecalciferol (CALCIUM 600+D3) 600-400 MG-UNIT TABS Take 1 tablet by mouth daily.  . Chloral Hydrate CRYS Take 10 mLs by mouth at bedtime as needed (Call  liquid into [place]).  . Cholecalciferol (VITAMIN D-3) 1000 UNITS CAPS Take 1 capsule by mouth daily.   . clindamycin (CLEOCIN) 150 MG capsule For dental use only  . clindamycin (CLEOCIN) 300 MG capsule Take 300 mg by mouth as directed. Take 600 mg (2 capsules) by mouth one hour prior to dental procedures   . clopidogrel (PLAVIX) 75 MG  tablet Take 1 tablet (75 mg total) by mouth daily with breakfast.  . Coenzyme Q10 (CO Q 10 PO) Take 1 capsule by mouth daily.  . ferrous gluconate (FERGON) 324 MG tablet Take 324 mg by mouth 3 (three) times a week. M,W,F  . fluticasone (FLONASE) 50 MCG/ACT nasal spray Place 2 sprays into both nostrils daily.  Marland Kitchen levocetirizine (XYZAL) 5 MG tablet as needed.   Marland Kitchen losartan (COZAAR) 25 MG tablet Take 1 tablet (25 mg total) by mouth daily.  . Magnesium 400 MG CAPS Take 400 mg by mouth every morning.   . montelukast (SINGULAIR) 10 MG tablet as needed.   . nitroGLYCERIN (NITROSTAT) 0.4 MG SL tablet Place 1 tablet (0.4 mg total) under the  tongue every 5 (five) minutes as needed for chest pain (CP or SOB).  Marland Kitchen Ophthalmic Irrigation Solution (OCUSOFT EYE [place] OP) Apply 1 application to eye 2 (two) times daily as needed (dry eyes). Morning and before bed  . pantoprazole (PROTONIX) 40 MG tablet Take 1 tablet (40 mg total) by mouth 2 (two) times daily.  . rosuvastatin (CRESTOR) 20 MG tablet Take 0.5 tablets (10 mg total) by mouth daily.  . Wheat Dextrin (BENEFIBER DRINK MIX PO) Take 5 mLs by mouth every morning. Mix with 1/2 a glass water, and takes with vitamins     Allergies:   Amoxicillin-pot clavulanate, Penicillin g, Penicillins, Sulfa antibiotics, Sulfamethoxazole, and Sulfonamide derivatives   Social History   Socioeconomic History  . Marital status: Married    Spouse name: Not on file  . Number of children: 3  . Years of education: 16  . Highest education level: Bachelor's degree (e.g., BA, AB, BS)  Occupational History  . Occupation: Retired  Scientific laboratory technician  . Financial resource strain: Not hard at all  . Food insecurity    Worry: Never true    Inability: Never true  . Transportation needs    Medical: No    Non-medical: No  Tobacco Use  . Smoking status: Former Smoker    Packs/day: 0.50    Years: 30.00    Pack years: 15.00    Types: Cigarettes  . Smokeless tobacco: Never Used  . Tobacco comment: quit smoking 1980  Substance and Sexual Activity  . Alcohol use: Yes    Alcohol/week: 7.0 standard drinks    Types: 7 Glasses of wine per week    Comment: wine nightly with dinner  . Drug use: No  . Sexual activity: Not Currently  Lifestyle  . Physical activity    Days per week: 6 days    Minutes per session: 30 min  . Stress: To some extent  Relationships  . Social connections    Talks on phone: More than three times a week    Gets together: More than three times a week    Attends religious service: Not on file    Active member of club or organization: Yes    Attends meetings of clubs or organizations:  More than 4 times per year    Relationship status: Married  Other Topics Concern  . Not on file  Social History Narrative   Lives at [facility] with her husband.   Right-handed.   Caffeine use: 3-4 cups per day (some tea, mixes coffee with decaf).     Family History: The patient's family history includes Colon cancer in her mother; Hypertension in her mother; Kidney disease in her father; Rectal cancer in her mother. There is no history of Esophageal cancer,  Stomach cancer, or Colon polyps. ROS:   Please see the history of present illness.     All other systems reviewed and are negative.   EKG:  EKG is not ordered today.    Recent Labs: 02/04/2019: BUN 11; Creatinine, Ser 0.77; Potassium 4.2; Sodium 136 02/05/2019: ALT 29; Hemoglobin 11.5; Platelets 149   Recent Lipid Panel    Component Value Date/Time   CHOL 159 02/04/2019 0600   TRIG 58 02/04/2019 0600   HDL 76 02/04/2019 0600   CHOLHDL 2.1 02/04/2019 0600   VLDL 12 02/04/2019 0600   LDLCALC 71 02/04/2019 0600   LDLDIRECT 144.0 11/29/2012 1601    Physical Exam:    VS:  BP 130/70   Pulse (!) 59   Ht 5\' 5"  (1.651 m)   Wt 160 lb (72.6 kg)   SpO2 99%   BMI 26.63 kg/m     Wt Readings from Last 6 Encounters:  02/17/19 160 lb (72.6 kg)  02/05/19 156 lb (70.8 kg)  12/24/18 160 lb (72.6 kg)  12/23/18 160 lb 3.2 oz (72.7 kg)  11/15/18 159 lb 6.4 oz (72.3 kg)  11/11/18 159 lb (72.1 kg)     Physical Exam  Constitutional: She is oriented to person, place, and time. She appears well-developed and well-nourished. No distress.  HENT:  Head: Normocephalic and atraumatic.  Neck: Normal range of motion. Neck supple. No JVD present.  Cardiovascular: Normal rate, regular rhythm and intact distal pulses.  Murmur heard.  Diastolic murmur is present with a grade of 2/6 at the upper right sternal border radiating to the neck. Pulmonary/Chest: Effort normal and breath sounds normal. No respiratory distress. She has no wheezes.  She has no rales.  Abdominal: Soft. Bowel sounds are normal.  Musculoskeletal: Normal range of motion.        General: No edema.  Neurological: She is alert and oriented to person, place, and time.  Skin: Skin is warm and dry.  Psychiatric: She has a normal mood and affect. Her behavior is normal. Judgment and thought content normal.  Vitals reviewed.    ASSESSMENT:    1. NSTEMI (non-ST elevated myocardial infarction) ([place])   2. Chronic diastolic heart failure ([place])   3. S/P AVR   4. Essential (primary) hypertension   5. Hyperlipidemia, unspecified hyperlipidemia type   6. Bradycardia    PLAN:    In order of problems listed above:  NSTEMI HS Troponin maxed at [address]01. She underwent heart catheterization on 02/04/19 that revealed 100% stenosis in the distal LAD thought to be thrombotic. Medical therapy was recommended. ASA and plavix x 12 months. BB held for bradycardia.   -Pt is doing well and is working on increasing her activity, now up to 10 mg three times per day. No anginal symptoms.  -Pt feels that stress is one of her biggest issues. She plans to start a transindental meditation course. We had a log discussion on MI, reviewed cath result, plan for treatment. She was appreciative.   Mildly reduced EF Chronic Diastolic heart failure Echo with EF of 45-50% with mild increase in left ventricular hypertrophy and diastolic dysfunction. -Losartan added in hospital and pt is tolerating well.   -No evidence of volume overload.   AS s/p AVR 10/2014 Echocardiogram with AVR mean gradient 12 mmHg. Mild aortic valve sclerosis without stenosis.  -Continue SBE prophylaxis  Hypertension -BP is well controlled. Continue current medication.   Hyperlipidemia, Goal LDL <70 02/04/2019: Cholesterol 159; HDL 76; LDL Cholesterol 71; Triglycerides 58;  VLDL 12 Crestor increased to 20 mg.  Bradycardia No BB.   Medication Adjustments/Labs and Tests Ordered: Current medicines are reviewed  at length with the patient today.  Concerns regarding medicines are outlined above. Labs and tests ordered and medication changes are outlined in the patient instructions below:  Patient Instructions  Medication Instructions:  Your physician recommends that you continue on your current medications as directed. Please refer to the Current Medication list given to you today.  *If you need a refill on your cardiac medications before your next appointment, please call your pharmacy*  Lab Work: None   If you have labs (blood work) drawn today and your tests are completely normal, you will receive your results only by: Marland Kitchen MyChart Message (if you have MyChart) OR . A paper copy in the mail If you have any lab test that is abnormal or we need to change your treatment, we will call you to review the results.  Testing/Procedures: None   Follow-Up: At [facility], you and your health needs are our priority.  As part of our continuing mission to provide you with exceptional heart care, we have created designated Provider Care Teams.  These Care Teams include your primary Cardiologist (physician) and Advanced Practice Providers (APPs -  Physician Assistants and Nurse Practitioners) who all work together to provide you with the care you need, when you need it.  Your next appointment:   6 months  The format for your next appointment:   In Person  Provider:   You may see [name], MD or one of the following Advanced Practice Providers on your designated Care Team:    Richardson Dopp, PA-C  [place], [place]  Daune Perch, NP   Other Instructions  Lifestyle Modifications to Prevent and Treat Heart Disease -Recommend heart healthy/Mediterranean diet, with whole grains, fruits, vegetables, fish, lean meats, nuts, olive oil and avocado oil.  -Limit salt intake to less than 2000 mg per day.  -Recommend moderate walking, starting slowly with a few minutes and working up to 3-5 times/week  for 30-50 minutes each session. Aim for at least 150 minutes.week. Goal should be pace of 3 miles/hours, or walking 1.5 miles in 30 minutes -Recommend avoidance of tobacco products. Avoid excess alcohol. -Keep blood pressure well controlled, ideally less than 130/80.      Signed, Daune Perch, NP  02/17/2019 4:36 PM    [place]

## 2019-02-22 ENCOUNTER — Telehealth: Payer: Self-pay

## 2019-02-22 ENCOUNTER — Other Ambulatory Visit: Payer: Self-pay

## 2019-02-22 NOTE — Telephone Encounter (Signed)
Error

## 2019-02-23 ENCOUNTER — Telehealth: Payer: Self-pay

## 2019-02-23 NOTE — Telephone Encounter (Signed)
Copied from [place] [phone] [phone]. Topic: General - Other >> Feb 23, 2019  3:36 PM Rainey Pines A wrote:  Pharmacy stated that a request has been sent over 4 times today in regards to patients medication. [place], [place] [phone] (Phone) [phone] (Fax)

## 2019-02-23 NOTE — Telephone Encounter (Signed)
LVM with pt trying to see what is needing to be refilled.

## 2019-02-24 NOTE — Telephone Encounter (Signed)
Pt is not needing a refill on anything right now.

## 2019-03-02 ENCOUNTER — Ambulatory Visit
Admission: RE | Admit: 2019-03-02 | Discharge: 2019-03-02 | Disposition: A | Discharge status: Home / Self Care | Payer: Medicare Other | Source: Ambulatory Visit | Attending: Internal Medicine | Admitting: Internal Medicine

## 2019-03-02 ENCOUNTER — Other Ambulatory Visit: Payer: Self-pay

## 2019-03-02 DIAGNOSIS — Z1231 Encounter for screening mammogram for malignant neoplasm of breast: Secondary | ICD-10-CM

## 2019-03-03 ENCOUNTER — Other Ambulatory Visit: Payer: Self-pay | Admitting: Internal Medicine

## 2019-03-03 ENCOUNTER — Encounter: Payer: Self-pay | Admitting: Internal Medicine

## 2019-03-03 MED ORDER — ALPRAZOLAM 0.5 MG PO TABS: ORAL_TABLET | ORAL | 0 refills | Status: DC

## 2019-03-15 ENCOUNTER — Other Ambulatory Visit: Payer: Self-pay

## 2019-03-15 ENCOUNTER — Ambulatory Visit
Admission: RE | Admit: 2019-03-15 | Discharge: 2019-03-15 | Disposition: A | Discharge status: Home / Self Care | Payer: Medicare Other | Source: Ambulatory Visit | Attending: Internal Medicine | Admitting: Internal Medicine

## 2019-03-15 ENCOUNTER — Inpatient Hospital Stay: Admission: RE | Admit: 2019-03-15 | Payer: Medicare Other | Source: Ambulatory Visit

## 2019-03-15 ENCOUNTER — Other Ambulatory Visit: Payer: Medicare Other

## 2019-03-15 DIAGNOSIS — R918 Other nonspecific abnormal finding of lung field: Secondary | ICD-10-CM

## 2019-03-15 DIAGNOSIS — R911 Solitary pulmonary nodule: Secondary | ICD-10-CM | POA: Diagnosis not present

## 2019-03-16 ENCOUNTER — Encounter: Payer: Self-pay | Admitting: Internal Medicine

## 2019-04-08 DIAGNOSIS — L570 Actinic keratosis: Secondary | ICD-10-CM | POA: Diagnosis not present

## 2019-04-08 DIAGNOSIS — D692 Other nonthrombocytopenic purpura: Secondary | ICD-10-CM | POA: Diagnosis not present

## 2019-04-08 DIAGNOSIS — L821 Other seborrheic keratosis: Secondary | ICD-10-CM | POA: Diagnosis not present

## 2019-04-08 DIAGNOSIS — D1801 Hemangioma of skin and subcutaneous tissue: Secondary | ICD-10-CM | POA: Diagnosis not present

## 2019-04-08 DIAGNOSIS — L723 Sebaceous cyst: Secondary | ICD-10-CM | POA: Diagnosis not present

## 2019-04-08 DIAGNOSIS — L720 Epidermal cyst: Secondary | ICD-10-CM | POA: Diagnosis not present

## 2019-04-08 DIAGNOSIS — D2261 Melanocytic nevi of right upper limb, including shoulder: Secondary | ICD-10-CM | POA: Diagnosis not present

## 2019-04-18 ENCOUNTER — Telehealth: Payer: Self-pay

## 2019-04-18 NOTE — Telephone Encounter (Signed)
Yes OV

## 2019-04-18 NOTE — Telephone Encounter (Signed)
Copied from [place] [phone]. Topic: Appointment Scheduling - Scheduling Inquiry for Clinic >> Apr 18, 2019 10:29 AM Greggory Keen D wrote: Reason for CRM: Clinic nurse Elie Goody at [facility] called saying the patient has been having nose bleeds the last three nights.  Allergy type symptoms.  Pt is on Plavix and they dont know if that is why she is having these symptoms  CB#  [phone] or she said you can also call the patient directly

## 2019-04-18 NOTE — Telephone Encounter (Signed)
Do you want an OV for this?

## 2019-04-19 ENCOUNTER — Ambulatory Visit (INDEPENDENT_AMBULATORY_CARE_PROVIDER_SITE_OTHER): Payer: Medicare Other | Admitting: Internal Medicine

## 2019-04-19 ENCOUNTER — Other Ambulatory Visit: Payer: Self-pay

## 2019-04-19 ENCOUNTER — Encounter: Payer: Self-pay | Admitting: Internal Medicine

## 2019-04-19 VITALS — BP 156/68 | HR 55 | Temp 97.8°F | Resp 16 | Ht 65.0 in | Wt 163.0 lb

## 2019-04-19 DIAGNOSIS — R040 Epistaxis: Secondary | ICD-10-CM | POA: Insufficient documentation

## 2019-04-19 NOTE — Progress Notes (Signed)
Subjective:    Patient ID: [name], female    DOB: 22-May-1932, 85 y.o.   MRN: [id number]  HPI The patient is here for an acute visit.  Nosebleeds:  She had the first one three nights ago, and had 2 others since then during the day.  Her last nosebleed was 2 days ago.  She did look it up and knew that this was a possible side effects of the Plavix and has not taken it for the past 2 days.  She denies any nose pain.  She does have an appointment with ENT tomorrow to see if something can be cauterized.   Medications and allergies reviewed with patient and updated if appropriate.  Patient Active Problem List   Diagnosis Date Noted  . NSTEMI (non-ST elevated myocardial infarction) ([place])   . Iron deficiency 12/26/2018  . Anemia 12/26/2018  . B12 deficiency 12/23/2018  . Discoloration of skin of finger 11/11/2018  . Chronic gastric ulcer without hemorrhage and without perforation 06/05/2018  . Constipation, chronic 06/05/2018  . Callus of foot 03/04/2018  . Hammertoes of both feet 03/04/2018  . PUD (peptic ulcer disease) 12/21/2017  . Acute blood loss anemia 12/21/2017  . Syncope 12/14/2017  . Macrocytic anemia 12/14/2017  . Near syncope 12/14/2017  . Left sided numbness 10/19/2017  . Small vessel disease, cerebrovascular 10/19/2017  . Numbness and tingling of left arm and leg 10/13/2017  . Anxiety 10/02/2017  . Prediabetes 09/29/2016  . Lung nodule < 6cm on CT 09/29/2016  . Leg cramps 01/29/2016  . Insomnia 09/12/2015  . Rectal bleed 09/12/2015  . S/P AVR 11/16/2014  . Branch retinal vein occlusion 10/12/2013  . Aortic valve stenosis, severe   . Allergic rhinitis 09/10/2009  . Hyperlipidemia 11/30/2006  . Mitral valve disease 10/29/2006  . Osteopenia 10/29/2006  . History of right breast cancer 10/29/2006    Current Outpatient Medications on File Prior to Visit  Medication Sig Dispense Refill  . acetaminophen (TYLENOL) 500 MG tablet Take 500-1,000 mg by  mouth at bedtime as needed for mild pain or moderate pain.    Marland Kitchen ALPRAZolam (XANAX) 0.5 MG tablet TAKE ONE TABLET BY MOUTH DAILY AS NEEDED FOR ANXIETY *DO NOT TAKE AT NIGHT* 90 tablet 0  . aspirin EC 81 MG EC tablet Take 1 tablet (81 mg total) by mouth daily. 90 tablet 3  . Calcium Carb-Cholecalciferol (CALCIUM 600+D3) 600-400 MG-UNIT TABS Take 1 tablet by mouth daily.    . Chloral Hydrate CRYS Take 10 mLs by mouth at bedtime as needed (Call  liquid into [place]). 900 g 1  . Cholecalciferol (VITAMIN D-3) 1000 UNITS CAPS Take 1 capsule by mouth daily.     . clindamycin (CLEOCIN) 150 MG capsule For dental use only    . clindamycin (CLEOCIN) 300 MG capsule Take 300 mg by mouth as directed. Take 600 mg (2 capsules) by mouth one hour prior to dental procedures     . clopidogrel (PLAVIX) 75 MG tablet Take 1 tablet (75 mg total) by mouth daily with breakfast. 90 tablet 3  . Coenzyme Q10 (CO Q 10 PO) Take 1 capsule by mouth daily.    . ferrous gluconate (FERGON) 324 MG tablet Take 324 mg by mouth 3 (three) times a week. M,W,F    . fluticasone (FLONASE) 50 MCG/ACT nasal spray Place 2 sprays into both nostrils daily. 16 g 6  . levocetirizine (XYZAL) 5 MG tablet as needed.     Marland Kitchen  losartan (COZAAR) 25 MG tablet Take 1 tablet (25 mg total) by mouth daily. 90 tablet 3  . Magnesium 400 MG CAPS Take 400 mg by mouth every morning.     . montelukast (SINGULAIR) 10 MG tablet as needed.     . nitroGLYCERIN (NITROSTAT) 0.4 MG SL tablet Place 1 tablet (0.4 mg total) under the tongue every 5 (five) minutes as needed for chest pain (CP or SOB). 25 tablet 3  . Ophthalmic Irrigation Solution (OCUSOFT EYE [place] OP) Apply 1 application to eye 2 (two) times daily as needed (dry eyes). Morning and before bed    . pantoprazole (PROTONIX) 40 MG tablet Take 1 tablet (40 mg total) by mouth 2 (two) times daily. 180 tablet 4  . rosuvastatin (CRESTOR) 20 MG tablet Take 0.5 tablets (10 mg total) by mouth daily. 90  tablet 3  . Wheat Dextrin (BENEFIBER DRINK MIX PO) Take 5 mLs by mouth every morning. Mix with 1/2 a glass water, and takes with vitamins     No current facility-administered medications on file prior to visit.    Past Medical History:  Diagnosis Date  . Allergy   . Anemia   . Anxiety   . Aortic valve stenosis, severe   . Breast cancer ([place]) 2007   right  . Cataract   . Hiatal hernia   . Hypercholesteremia   . Hypoglycemia   . Osteopenia   . Personal history of radiation therapy   . Tingling   . Ulcer 2012   bleeding gastric Ulcer    Past Surgical History:  Procedure Laterality Date  . AORTIC VALVE REPLACEMENT N/A 11/16/2014   Procedure: AORTIC VALVE REPLACEMENT (AVR);  Surgeon: Gaye Pollack, MD;  Location: [place];  Service: Open Heart Surgery;  Laterality: N/A;  . BIOPSY  12/15/2017   Procedure: BIOPSY;  Surgeon: Rush Landmark Telford Nab., MD;  Location: [place];  Service: Gastroenterology;;  . BREAST BIOPSY    . BREAST LUMPECTOMY Right    2005?  Marland Kitchen BREAST SURGERY    . CARDIAC CATHETERIZATION N/A 10/18/2014   Procedure: Right/Left Heart Cath and Coronary Angiography;  Surgeon: [name], MD;  Location: [place] [facility];  Service: Cardiovascular;  Laterality: N/A;  . CORONARY ANGIOGRAPHY N/A 02/04/2019   Procedure: CORONARY ANGIOGRAPHY;  Surgeon: Nelva Bush, MD;  Location: [place] [facility];  Service: Cardiovascular;  Laterality: N/A;  . COSMETIC SURGERY     face  . ESOPHAGOGASTRODUODENOSCOPY (EGD) WITH PROPOFOL N/A 12/15/2017   Procedure: ESOPHAGOGASTRODUODENOSCOPY (EGD) WITH PROPOFOL;  Surgeon: Rush Landmark Telford Nab., MD;  Location: [place];  Service: Gastroenterology;  Laterality: N/A;  . PARATHYROIDECTOMY    . TEE WITHOUT CARDIOVERSION N/A 11/16/2014   Procedure: TRANSESOPHAGEAL ECHOCARDIOGRAM (TEE);  Surgeon: Gaye Pollack, MD;  Location: [place];  Service: Open Heart Surgery;  Laterality: N/A;  . TONSILLECTOMY      Social History    Socioeconomic History  . Marital status: Married    Spouse name: Not on file  . Number of children: 3  . Years of education: 16  . Highest education level: Bachelor's degree (e.g., BA, AB, BS)  Occupational History  . Occupation: Retired  Tobacco Use  . Smoking status: Former Smoker    Packs/day: 0.50    Years: 30.00    Pack years: 15.00    Types: Cigarettes  . Smokeless tobacco: Never Used  . Tobacco comment: quit smoking 1980  Substance and Sexual Activity  . Alcohol use: Yes    Alcohol/week:  7.0 standard drinks    Types: 7 Glasses of wine per week    Comment: wine nightly with dinner  . Drug use: No  . Sexual activity: Not Currently  Other Topics Concern  . Not on file  Social History Narrative   Lives at [facility] with her husband.   Right-handed.   Caffeine use: 3-4 cups per day (some tea, mixes coffee with decaf).   Social Determinants of Health   Financial Resource Strain:   . Difficulty of Paying Living Expenses: Not on file  Food Insecurity:   . Worried About Charity fundraiser in the Last Year: Not on file  . Ran Out of Food in the Last Year: Not on file  Transportation Needs:   . Lack of Transportation (Medical): Not on file  . Lack of Transportation (Non-Medical): Not on file  Physical Activity: Sufficiently Active  . Days of Exercise per Week: 6 days  . Minutes of Exercise per Session: 30 min  Stress: Stress Concern Present  . Feeling of Stress : To some extent  Social Connections: Unknown  . Frequency of Communication with Friends and Family: Not on file  . Frequency of Social Gatherings with Friends and Family: Not on file  . Attends Religious Services: Not on file  . Active Member of Clubs or Organizations: Yes  . Attends Archivist Meetings: More than 4 times per year  . Marital Status: Not on file    Family History  Problem Relation Age of Onset  . Hypertension Mother   . Rectal cancer Mother   . Colon cancer Mother   .  Kidney disease Father   . Esophageal cancer Neg Hx   . Stomach cancer Neg Hx   . Colon polyps Neg Hx     Review of Systems  Constitutional: Negative for chills and fever.  HENT: Positive for nosebleeds. Negative for congestion and sinus pressure.   Neurological: Negative for headaches.       Objective:   Vitals:   04/19/19 1540  BP: (!) 156/68  Pulse: (!) 55  Resp: 16  Temp: 97.8 F (36.6 C)  SpO2: 99%   BP Readings from Last 3 Encounters:  04/19/19 (!) 156/68  02/17/19 130/70  02/05/19 (!) 160/84   Wt Readings from Last 3 Encounters:  04/19/19 163 lb (73.9 kg)  02/17/19 160 lb (72.6 kg)  02/05/19 156 lb (70.8 kg)   Body mass index is 27.12 kg/m.   Physical Exam Constitutional:      General: She is not in acute distress.    Appearance: Normal appearance. She is not ill-appearing.  HENT:     Nose: No congestion or rhinorrhea.     Comments: No active bleeding, possible small scab right medial nostril Skin:    General: Skin is warm and dry.  Neurological:     Mental Status: She is alert.            Assessment & Plan:    See Problem List for Assessment and Plan of chronic medical problems.    This visit occurred during the SARS-CoV-2 public health emergency.  Safety protocols were in place, including screening questions prior to the visit, additional usage of staff PPE, and extensive cleaning of exam room while observing appropriate contact time as indicated for disinfecting solutions.

## 2019-04-19 NOTE — Assessment & Plan Note (Signed)
3 episodes, no active bleeding for 2 days-likely because she did not take Plavix for the past 2 days No concerning findings on exam.  Does have an appoint with ENT tomorrow Start saline nasal spray-use multiple times a day Start using a humidifier in bedroom at nighttime Vaseline into nostrils at bedtime Discussed why she was on Plavix and that she cannot just stop the medication-needs to discuss this with cardiology-needs to be on it for full 12 months after NSTEMI-she will call cardiology

## 2019-04-19 NOTE — Telephone Encounter (Signed)
Called pt and told husband to have patient call back to set up an appointment if she felt like she needed one. Husband will let her know.

## 2019-04-19 NOTE — Patient Instructions (Addendum)
Use saline nasal spray in each nostril a few times a day  Get a humidifier for your bedroom or house  Use vaseline in the nostril at night to keep the mucus membrane moist   If the bleeding start you can apply pressure as long as it takes to stop the bleeding.    Talk to cardiology about the plavix, but continue to take it - start taking it again.    See what ENT says.

## 2019-04-20 ENCOUNTER — Encounter (INDEPENDENT_AMBULATORY_CARE_PROVIDER_SITE_OTHER): Payer: Self-pay | Admitting: Otolaryngology

## 2019-04-20 ENCOUNTER — Other Ambulatory Visit: Payer: Self-pay

## 2019-04-20 ENCOUNTER — Ambulatory Visit (INDEPENDENT_AMBULATORY_CARE_PROVIDER_SITE_OTHER): Payer: Self-pay | Admitting: Otolaryngology

## 2019-04-20 ENCOUNTER — Ambulatory Visit (INDEPENDENT_AMBULATORY_CARE_PROVIDER_SITE_OTHER): Payer: Medicare Other | Admitting: Otolaryngology

## 2019-04-20 VITALS — Temp 97.7°F

## 2019-04-20 DIAGNOSIS — R040 Epistaxis: Secondary | ICD-10-CM

## 2019-04-20 NOTE — Progress Notes (Unsigned)
HPI: Zianne Schubring is a 85 y.o. female who returns today for evaluation of recurrent right epistaxis.  This began about a week ago.  Her last nosebleed was 3 days ago.Marland Kitchen  No past medical history on file.  Social History   Socioeconomic History  . Marital status: Unknown    Spouse name: Not on file  . Number of children: Not on file  . Years of education: Not on file  . Highest education level: Not on file  Occupational History  . Not on file  Tobacco Use  . Smoking status: Former Smoker    Packs/day: 0.50    Years: 15.00    Pack years: 7.50    Start date: [place] date: 1986    Years since quitting: 35.0  . Smokeless tobacco: Never Used  Substance and Sexual Activity  . Alcohol use: Not on file  . Drug use: Not on file  . Sexual activity: Not on file  Other Topics Concern  . Not on file  Social History Narrative  . Not on file   Social Determinants of Health   Financial Resource Strain:   . Difficulty of Paying Living Expenses: Not on file  Food Insecurity:   . Worried About Charity fundraiser in the Last Year: Not on file  . Ran Out of Food in the Last Year: Not on file  Transportation Needs:   . Lack of Transportation (Medical): Not on file  . Lack of Transportation (Non-Medical): Not on file  Physical Activity:   . Days of Exercise per Week: Not on file  . Minutes of Exercise per Session: Not on file  Stress:   . Feeling of Stress : Not on file  Social Connections:   . Frequency of Communication with Friends and Family: Not on file  . Frequency of Social Gatherings with Friends and Family: Not on file  . Attends Religious Services: Not on file  . Active Member of Clubs or Organizations: Not on file  . Attends Archivist Meetings: Not on file  . Marital Status: Not on file   No family history on file. Not on File Prior to Admission medications   Not on File     Positive ROS: ***  All other systems have been reviewed and were otherwise  negative with the exception of those mentioned in the HPI and as above.  Physical Exam: Constitutional: Alert, well-appearing, no acute distress Ears: External ears without lesions or tenderness. Ear canals are clear bilaterally with intact, clear TMs.  Nasal: External nose without lesions. Septum ***. Clear nasal passages Oral: Lips and gums without lesions. Tongue and palate mucosa without lesions. Posterior oropharynx clear. Neck: No palpable adenopathy or masses Respiratory: Breathing comfortably  Skin: No facial/neck lesions or rash noted.  Procedures  Assessment: ***  Plan: ***   Radene Journey, MD

## 2019-04-20 NOTE — Progress Notes (Signed)
HPI: [name] is a 85 y.o. female who returns today for evaluation of recurrent right-sided epistaxis.  She is on Plavix.  She began having right-sided nosebleed about a week ago.  Last time it bled was this past Sunday..  Past Medical History:  Diagnosis Date  . Allergy   . Anemia   . Anxiety   . Aortic valve stenosis, severe   . Breast cancer ([place]) 2007   right  . Cataract   . Hiatal hernia   . Hypercholesteremia   . Hypoglycemia   . Osteopenia   . Personal history of radiation therapy   . Tingling   . Ulcer 2012   bleeding gastric Ulcer   Past Surgical History:  Procedure Laterality Date  . AORTIC VALVE REPLACEMENT N/A 11/16/2014   Procedure: AORTIC VALVE REPLACEMENT (AVR);  Surgeon: Gaye Pollack, MD;  Location: [place];  Service: Open Heart Surgery;  Laterality: N/A;  . BIOPSY  12/15/2017   Procedure: BIOPSY;  Surgeon: Rush Landmark Telford Nab., MD;  Location: [place];  Service: Gastroenterology;;  . BREAST BIOPSY    . BREAST LUMPECTOMY Right    2005?  Marland Kitchen BREAST SURGERY    . CARDIAC CATHETERIZATION N/A 10/18/2014   Procedure: Right/Left Heart Cath and Coronary Angiography;  Surgeon: [name], MD;  Location: [place] [facility];  Service: Cardiovascular;  Laterality: N/A;  . CORONARY ANGIOGRAPHY N/A 02/04/2019   Procedure: CORONARY ANGIOGRAPHY;  Surgeon: Nelva Bush, MD;  Location: [place] [facility];  Service: Cardiovascular;  Laterality: N/A;  . COSMETIC SURGERY     face  . ESOPHAGOGASTRODUODENOSCOPY (EGD) WITH PROPOFOL N/A 12/15/2017   Procedure: ESOPHAGOGASTRODUODENOSCOPY (EGD) WITH PROPOFOL;  Surgeon: Rush Landmark Telford Nab., MD;  Location: [place];  Service: Gastroenterology;  Laterality: N/A;  . PARATHYROIDECTOMY    . TEE WITHOUT CARDIOVERSION N/A 11/16/2014   Procedure: TRANSESOPHAGEAL ECHOCARDIOGRAM (TEE);  Surgeon: Gaye Pollack, MD;  Location: [place];  Service: Open Heart Surgery;  Laterality: N/A;  . TONSILLECTOMY     Social History    Socioeconomic History  . Marital status: Married    Spouse name: Not on file  . Number of children: 3  . Years of education: 16  . Highest education level: Bachelor's degree (e.g., BA, AB, BS)  Occupational History  . Occupation: Retired  Tobacco Use  . Smoking status: Former Smoker    Packs/day: 0.50    Years: 30.00    Pack years: 15.00    Types: Cigarettes  . Smokeless tobacco: Never Used  . Tobacco comment: quit smoking 1980  Substance and Sexual Activity  . Alcohol use: Yes    Alcohol/week: 7.0 standard drinks    Types: 7 Glasses of wine per week    Comment: wine nightly with dinner  . Drug use: No  . Sexual activity: Not Currently  Other Topics Concern  . Not on file  Social History Narrative   Lives at [facility] with her husband.   Right-handed.   Caffeine use: 3-4 cups per day (some tea, mixes coffee with decaf).   Social Determinants of Health   Financial Resource Strain:   . Difficulty of Paying Living Expenses: Not on file  Food Insecurity:   . Worried About Charity fundraiser in the Last Year: Not on file  . Ran Out of Food in the Last Year: Not on file  Transportation Needs:   . Lack of Transportation (Medical): Not on file  . Lack of Transportation (Non-Medical): Not on file  Physical Activity: Sufficiently Active  . Days of Exercise per Week: 6 days  . Minutes of Exercise per Session: 30 min  Stress: Stress Concern Present  . Feeling of Stress : To some extent  Social Connections: Unknown  . Frequency of Communication with Friends and Family: Not on file  . Frequency of Social Gatherings with Friends and Family: Not on file  . Attends Religious Services: Not on file  . Active Member of Clubs or Organizations: Yes  . Attends Archivist Meetings: More than 4 times per year  . Marital Status: Not on file   Family History  Problem Relation Age of Onset  . Hypertension Mother   . Rectal cancer Mother   . Colon cancer Mother   .  Kidney disease Father   . Esophageal cancer Neg Hx   . Stomach cancer Neg Hx   . Colon polyps Neg Hx    Allergies  Allergen Reactions  . Amoxicillin-Pot Clavulanate Hives and Rash  . Penicillin G Rash  . Penicillins Hives and Rash  . Sulfa Antibiotics Rash  . Sulfamethoxazole Rash  . Sulfonamide Derivatives Hives   Prior to Admission medications   Medication Sig Start Date End Date Taking? Authorizing Provider  acetaminophen (TYLENOL) 500 MG tablet Take 500-1,000 mg by mouth at bedtime as needed for mild pain or moderate pain.   Yes [provider]  ALPRAZolam (XANAX) 0.5 MG tablet TAKE ONE TABLET BY MOUTH DAILY AS NEEDED FOR ANXIETY *DO NOT TAKE AT NIGHT* 03/03/19  Yes Burns, Claudina Lick, MD  aspirin EC 81 MG EC tablet Take 1 tablet (81 mg total) by mouth daily. 02/06/19  Yes Duke, Tami Lin, PA  Calcium Carb-Cholecalciferol (CALCIUM 600+D3) 600-400 MG-UNIT TABS Take 1 tablet by mouth daily.   Yes [provider]  Chloral Hydrate CRYS Take 10 mLs by mouth at bedtime as needed (Call  liquid into [place]). 01/19/19  Yes Burns, Claudina Lick, MD  Cholecalciferol (VITAMIN D-3) 1000 UNITS CAPS Take 1 capsule by mouth daily.    Yes [provider]  clindamycin (CLEOCIN) 150 MG capsule For dental use only 02/03/19  Yes [provider]  clindamycin (CLEOCIN) 300 MG capsule Take 300 mg by mouth as directed. Take 600 mg (2 capsules) by mouth one hour prior to dental procedures    Yes [provider]  clopidogrel (PLAVIX) 75 MG tablet Take 1 tablet (75 mg total) by mouth daily with breakfast. 02/09/19  Yes Duke, Tami Lin, PA  Coenzyme Q10 (CO Q 10 PO) Take 1 capsule by mouth daily.   Yes [provider]  ferrous gluconate (FERGON) 324 MG tablet Take 324 mg by mouth 3 (three) times a week. M,W,F   Yes [provider]  fluticasone (FLONASE) 50 MCG/ACT nasal spray Place 2 sprays into both nostrils daily. 03/02/18  Yes Marrian Salvage, FNP  levocetirizine (XYZAL) 5 MG tablet as needed.  10/11/18  Yes [provider]  losartan (COZAAR) 25 MG tablet Take 1 tablet (25 mg total) by mouth daily. 02/06/19  Yes Duke, Tami Lin, PA  Magnesium 400 MG CAPS Take 400 mg by mouth every morning.    Yes [provider]  montelukast (SINGULAIR) 10 MG tablet as needed.  09/16/18  Yes [provider]  nitroGLYCERIN (NITROSTAT) 0.4 MG SL tablet Place 1 tablet (0.4 mg total) under the tongue every 5 (five) minutes as needed for chest pain (CP or SOB). 02/05/19  Yes Fabian Sharp  Elmyra Ricks, [place]  Ophthalmic Irrigation Solution (OCUSOFT EYE [place] OP) Apply 1 application to eye 2 (two) times daily as needed (dry eyes). Morning and before bed   Yes [provider]  pantoprazole (PROTONIX) 40 MG tablet Take 1 tablet (40 mg total) by mouth 2 (two) times daily. 12/24/18  Yes Mansouraty, Telford Nab., MD  rosuvastatin (CRESTOR) 20 MG tablet Take 0.5 tablets (10 mg total) by mouth daily. 02/05/19  Yes Duke, Tami Lin, PA  Wheat Dextrin (BENEFIBER DRINK MIX PO) Take 5 mLs by mouth every morning. Mix with 1/2 a glass water, and takes with vitamins   Yes [provider]     Positive ROS: Otherwise negative  All other systems have been reviewed and were otherwise negative with the exception of those mentioned in the HPI and as above.  Physical Exam: Constitutional: Alert, well-appearing, no acute distress Ears: External ears without lesions or tenderness. Ear canals are clear bilaterally with intact, clear TMs.  Nasal: External nose without lesions. Septum midline.  She has a small scab over a small vessel in Kiesselbach's plexus on the right side of the septum.  This area was cauterized using silver nitrate.. Clear nasal passages otherwise. Oral: Lips and gums without lesions. Tongue and palate mucosa without lesions. Posterior oropharynx clear. Neck: No palpable adenopathy or masses Respiratory:  Breathing comfortably  Skin: No facial/neck lesions or rash noted.  Control of epistaxis  Date/Time: 04/20/2019 1:34 PM Performed by: Rozetta Nunnery, MD Authorized by: Rozetta Nunnery, MD   Consent:    Consent obtained:  Verbal   Consent given by:  Patient   Risks discussed:  Bleeding and pain   Alternatives discussed:  No treatment and observation Anesthesia:    Anesthesia method:  None Procedure details:    Treatment site:  R anterior   Treatment method:  Silver nitrate   Treatment complexity:  Limited   Treatment episode: initial   Post-procedure details:    Assessment:  Bleeding decreased   Patient tolerance of procedure:  Tolerated well, no immediate complications Comments:     Small vessel in Kiesselbach's plexus on the right side was cauterized using silver nitrate.    Assessment: Epistaxis right-sided  Plan: This was cauterized in the office she will follow-up as needed.   Radene Journey, MD

## 2019-04-25 ENCOUNTER — Ambulatory Visit (INDEPENDENT_AMBULATORY_CARE_PROVIDER_SITE_OTHER): Payer: Medicare Other | Admitting: Otolaryngology

## 2019-04-25 NOTE — Telephone Encounter (Signed)
Medication list updated to reflect patient is stopping Plavix.

## 2019-05-11 ENCOUNTER — Encounter: Payer: Self-pay | Admitting: Internal Medicine

## 2019-05-11 DIAGNOSIS — Z23 Encounter for immunization: Secondary | ICD-10-CM | POA: Diagnosis not present

## 2019-05-11 MED ORDER — HYDROCODONE-HOMATROPINE 5-1.5 MG/5ML PO SYRP: 5.0000 mL | ORAL_SOLUTION | Freq: Three times a day (TID) | ORAL | 0 refills | Status: DC | PRN

## 2019-05-13 DIAGNOSIS — H10412 Chronic giant papillary conjunctivitis, left eye: Secondary | ICD-10-CM | POA: Diagnosis not present

## 2019-05-17 DIAGNOSIS — Z20828 Contact with and (suspected) exposure to other viral communicable diseases: Secondary | ICD-10-CM | POA: Diagnosis not present

## 2019-05-23 DIAGNOSIS — Z20828 Contact with and (suspected) exposure to other viral communicable diseases: Secondary | ICD-10-CM | POA: Diagnosis not present

## 2019-06-01 ENCOUNTER — Encounter: Payer: Self-pay | Admitting: Internal Medicine

## 2019-06-07 ENCOUNTER — Other Ambulatory Visit: Payer: Self-pay

## 2019-06-07 ENCOUNTER — Other Ambulatory Visit: Payer: Self-pay | Admitting: Internal Medicine

## 2019-06-07 DIAGNOSIS — Z23 Encounter for immunization: Secondary | ICD-10-CM | POA: Diagnosis not present

## 2019-06-07 MED ORDER — ALPRAZOLAM 0.5 MG PO TABS: ORAL_TABLET | ORAL | 0 refills | Status: DC

## 2019-06-07 NOTE — Telephone Encounter (Signed)
Last OV 12/23/18 Next OV 06/23/19 Last RF 03/03/19

## 2019-06-07 NOTE — Telephone Encounter (Signed)
Duplicate.... refill under wrong chart. Original refill waiting on MD approval in correct chart...[name]

## 2019-06-15 ENCOUNTER — Encounter: Payer: Self-pay | Admitting: Internal Medicine

## 2019-06-15 DIAGNOSIS — D649 Anemia, unspecified: Secondary | ICD-10-CM

## 2019-06-15 DIAGNOSIS — E782 Mixed hyperlipidemia: Secondary | ICD-10-CM

## 2019-06-15 DIAGNOSIS — F5101 Primary insomnia: Secondary | ICD-10-CM

## 2019-06-15 DIAGNOSIS — E538 Deficiency of other specified B group vitamins: Secondary | ICD-10-CM

## 2019-06-15 DIAGNOSIS — M85869 Other specified disorders of bone density and structure, unspecified lower leg: Secondary | ICD-10-CM

## 2019-06-15 DIAGNOSIS — R7303 Prediabetes: Secondary | ICD-10-CM

## 2019-06-15 DIAGNOSIS — E611 Iron deficiency: Secondary | ICD-10-CM

## 2019-06-20 ENCOUNTER — Encounter (INDEPENDENT_AMBULATORY_CARE_PROVIDER_SITE_OTHER): Payer: Self-pay | Admitting: Otolaryngology

## 2019-06-20 ENCOUNTER — Other Ambulatory Visit: Payer: Self-pay

## 2019-06-20 ENCOUNTER — Other Ambulatory Visit (INDEPENDENT_AMBULATORY_CARE_PROVIDER_SITE_OTHER): Payer: Medicare Other

## 2019-06-20 DIAGNOSIS — E611 Iron deficiency: Secondary | ICD-10-CM | POA: Diagnosis not present

## 2019-06-20 DIAGNOSIS — M85869 Other specified disorders of bone density and structure, unspecified lower leg: Secondary | ICD-10-CM | POA: Diagnosis not present

## 2019-06-20 DIAGNOSIS — D649 Anemia, unspecified: Secondary | ICD-10-CM

## 2019-06-20 DIAGNOSIS — E782 Mixed hyperlipidemia: Secondary | ICD-10-CM | POA: Diagnosis not present

## 2019-06-20 DIAGNOSIS — E538 Deficiency of other specified B group vitamins: Secondary | ICD-10-CM | POA: Diagnosis not present

## 2019-06-20 DIAGNOSIS — R7303 Prediabetes: Secondary | ICD-10-CM

## 2019-06-20 LAB — CBC WITH DIFFERENTIAL/PLATELET
Basophils Absolute: 0 10*3/uL (ref 0.0–0.1)
Basophils Relative: 0.5 % (ref 0.0–3.0)
Eosinophils Absolute: 0.1 10*3/uL (ref 0.0–0.7)
Eosinophils Relative: 1.7 % (ref 0.0–5.0)
HCT: 41.5 % (ref 36.0–46.0)
Hemoglobin: 13.6 g/dL (ref 12.0–15.0)
Lymphocytes Relative: 44.7 % (ref 12.0–46.0)
Lymphs Abs: 2.7 10*3/uL (ref 0.7–4.0)
MCHC: 32.8 g/dL (ref 30.0–36.0)
MCV: 94.1 fl (ref 78.0–100.0)
Monocytes Absolute: 0.5 10*3/uL (ref 0.1–1.0)
Monocytes Relative: 8.2 % (ref 3.0–12.0)
Neutro Abs: 2.8 10*3/uL (ref 1.4–7.7)
Neutrophils Relative %: 44.9 % (ref 43.0–77.0)
Platelets: 184 10*3/uL (ref 150.0–400.0)
RBC: 4.41 Mil/uL (ref 3.87–5.11)
RDW: 14.1 % (ref 11.5–15.5)
WBC: 6.1 10*3/uL (ref 4.0–10.5)

## 2019-06-20 LAB — TSH: TSH: 1.82 u[IU]/mL (ref 0.35–4.50)

## 2019-06-20 LAB — VITAMIN B12: Vitamin B-12: 690 pg/mL (ref 211–911)

## 2019-06-20 LAB — HEMOGLOBIN A1C: Hgb A1c MFr Bld: 5.8 % (ref 4.6–6.5)

## 2019-06-21 LAB — COMPREHENSIVE METABOLIC PANEL
ALT: 17 U/L (ref 0–35)
AST: 23 U/L (ref 0–37)
Albumin: 4.6 g/dL (ref 3.5–5.2)
Alkaline Phosphatase: 84 U/L (ref 39–117)
BUN: 14 mg/dL (ref 6–23)
CO2: 26 mEq/L (ref 19–32)
Calcium: 9.5 mg/dL (ref 8.4–10.5)
Chloride: 100 mEq/L (ref 96–112)
Creatinine, Ser: 0.82 mg/dL (ref 0.40–1.20)
GFR: 66.36 mL/min (ref >60.00)
Glucose, Bld: 76 mg/dL (ref 70–99)
Potassium: 3.7 mEq/L (ref 3.5–5.1)
Sodium: 136 mEq/L (ref 135–145)
Total Bilirubin: 0.8 mg/dL (ref 0.2–1.2)
Total Protein: 6.7 g/dL (ref 6.0–8.3)

## 2019-06-21 LAB — LIPID PANEL
Cholesterol: 230 mg/dL — ABNORMAL HIGH (ref 0–200)
HDL: 94.9 mg/dL (ref >39.00)
LDL Cholesterol: 120 mg/dL — ABNORMAL HIGH (ref 0–99)
NonHDL: 134.94
Total CHOL/HDL Ratio: 2
Triglycerides: 73 mg/dL (ref 0.0–149.0)
VLDL: 14.6 mg/dL (ref 0.0–40.0)

## 2019-06-23 ENCOUNTER — Other Ambulatory Visit: Payer: Self-pay

## 2019-06-23 ENCOUNTER — Ambulatory Visit (INDEPENDENT_AMBULATORY_CARE_PROVIDER_SITE_OTHER): Payer: Medicare Other | Admitting: Internal Medicine

## 2019-06-23 ENCOUNTER — Encounter: Payer: Self-pay | Admitting: Internal Medicine

## 2019-06-23 VITALS — BP 144/68 | HR 58 | Temp 97.7°F | Resp 16 | Ht 65.0 in | Wt 157.0 lb

## 2019-06-23 DIAGNOSIS — Z8711 Personal history of peptic ulcer disease: Secondary | ICD-10-CM | POA: Diagnosis not present

## 2019-06-23 DIAGNOSIS — R7303 Prediabetes: Secondary | ICD-10-CM

## 2019-06-23 DIAGNOSIS — M85869 Other specified disorders of bone density and structure, unspecified lower leg: Secondary | ICD-10-CM | POA: Diagnosis not present

## 2019-06-23 DIAGNOSIS — E782 Mixed hyperlipidemia: Secondary | ICD-10-CM

## 2019-06-23 DIAGNOSIS — F5101 Primary insomnia: Secondary | ICD-10-CM | POA: Diagnosis not present

## 2019-06-23 DIAGNOSIS — I10 Essential (primary) hypertension: Secondary | ICD-10-CM | POA: Diagnosis not present

## 2019-06-23 DIAGNOSIS — I2510 Atherosclerotic heart disease of native coronary artery without angina pectoris: Secondary | ICD-10-CM | POA: Diagnosis not present

## 2019-06-23 DIAGNOSIS — E538 Deficiency of other specified B group vitamins: Secondary | ICD-10-CM

## 2019-06-23 DIAGNOSIS — F419 Anxiety disorder, unspecified: Secondary | ICD-10-CM

## 2019-06-23 MED ORDER — ROSUVASTATIN CALCIUM 20 MG PO TABS: 20.0000 mg | ORAL_TABLET | Freq: Every day | ORAL | 3 refills | Status: DC

## 2019-06-23 NOTE — Assessment & Plan Note (Signed)
Chronic, intermittent Takes xanax which helps No side effects Continue prn

## 2019-06-23 NOTE — Patient Instructions (Addendum)
  Blood work was reviewed.     Medications reviewed and updated.  Changes include :   Take crestor 20 mg daily  Your prescription(s) have been submitted to your pharmacy. Please take as directed and contact our office if you believe you are having problem(s) with the medication(s).    Please followup in 6 months

## 2019-06-23 NOTE — Assessment & Plan Note (Signed)
Chronic Walking taking calcium and vitamin d

## 2019-06-23 NOTE — Progress Notes (Signed)
Subjective:    Patient ID: [name], female    DOB: 1932-06-24, 86 y.o.   MRN: [id number]  HPI The patient is here for follow up of their chronic medical problems, including hypertension, CAD, hyperlipidemia, prediabetes, anxiety, insomnia, osteopenia, h/o PUD, B12 def  She is taking all of her medications as prescribed.   She is exercising regularly - walks 30 minutes a day.  She is compliant with a low sugar diet.       She had her blood work  Medications and allergies reviewed with patient and updated if appropriate.  Patient Active Problem List   Diagnosis Date Noted  . CAD (coronary artery disease) 06/23/2019  . Epistaxis 04/19/2019  . NSTEMI (non-ST elevated myocardial infarction) ([place])   . Iron deficiency 12/26/2018  . Anemia 12/26/2018  . B12 deficiency 12/23/2018  . Discoloration of skin of finger 11/11/2018  . Chronic gastric ulcer without hemorrhage and without perforation 06/05/2018  . Constipation, chronic 06/05/2018  . Callus of foot 03/04/2018  . Hammertoes of both feet 03/04/2018  . History of peptic ulcer disease 12/21/2017  . Hypertension 12/21/2017  . Syncope 12/14/2017  . Macrocytic anemia 12/14/2017  . Near syncope 12/14/2017  . Left sided numbness 10/19/2017  . Small vessel disease, cerebrovascular 10/19/2017  . Numbness and tingling of left arm and leg 10/13/2017  . Anxiety 10/02/2017  . Prediabetes 09/29/2016  . Lung nodule < 6cm on CT 09/29/2016  . Leg cramps 01/29/2016  . Insomnia 09/12/2015  . Rectal bleed 09/12/2015  . S/P AVR 11/16/2014  . Branch retinal vein occlusion 10/12/2013  . Aortic valve stenosis, severe   . Allergic rhinitis 09/10/2009  . Hyperlipidemia 11/30/2006  . Mitral valve disease 10/29/2006  . Osteopenia 10/29/2006  . History of right breast cancer 10/29/2006    Current Outpatient Medications on File Prior to Visit  Medication Sig Dispense Refill  . acetaminophen (TYLENOL) 500 MG tablet Take  500-1,000 mg by mouth at bedtime as needed for mild pain or moderate pain.    Marland Kitchen ALPRAZolam (XANAX) 0.5 MG tablet TAKE ONE TABLET BY MOUTH DAILY AS NEEDED FOR ANXIETY *DO NOT TAKE AT NIGHT* 90 tablet 0  . aspirin EC 81 MG EC tablet Take 1 tablet (81 mg total) by mouth daily. 90 tablet 3  . Calcium Carb-Cholecalciferol (CALCIUM 600+D3) 600-400 MG-UNIT TABS Take 1 tablet by mouth daily.    . Chloral Hydrate CRYS Take 10 mLs by mouth at bedtime as needed (Call  liquid into [facility]). 900 g 1  . Cholecalciferol (VITAMIN D-3) 1000 UNITS CAPS Take 1 capsule by mouth daily.     . clindamycin (CLEOCIN) 300 MG capsule Take 300 mg by mouth as directed. Take 600 mg (2 capsules) by mouth one hour prior to dental procedures     . Coenzyme Q10 (CO Q 10 PO) Take 1 capsule by mouth daily.    . ferrous gluconate (FERGON) 324 MG tablet Take 324 mg by mouth 3 (three) times a week. M,W,F    . fluticasone (FLONASE) 50 MCG/ACT nasal spray Place 2 sprays into both nostrils daily. 16 g 6  . levocetirizine (XYZAL) 5 MG tablet as needed.     Marland Kitchen losartan (COZAAR) 25 MG tablet Take 1 tablet (25 mg total) by mouth daily. 90 tablet 3  . Magnesium 400 MG CAPS Take 400 mg by mouth every morning.     . montelukast (SINGULAIR) 10 MG tablet as needed.     Marland Kitchen  nitroGLYCERIN (NITROSTAT) 0.4 MG SL tablet Place 1 tablet (0.4 mg total) under the tongue every 5 (five) minutes as needed for chest pain (CP or SOB). 25 tablet 3  . Ophthalmic Irrigation Solution (OCUSOFT EYE [place] OP) Apply 1 application to eye 2 (two) times daily as needed (dry eyes). Morning and before bed    . pantoprazole (PROTONIX) 40 MG tablet Take 1 tablet (40 mg total) by mouth 2 (two) times daily. 180 tablet 4  . Wheat Dextrin (BENEFIBER DRINK MIX PO) Take 5 mLs by mouth every morning. Mix with 1/2 a glass water, and takes with vitamins    . rosuvastatin (CRESTOR) 20 MG tablet Take 0.5 tablets (10 mg total) by mouth daily. (Patient not taking: Reported  on 06/23/2019) 90 tablet 3   No current facility-administered medications on file prior to visit.    Past Medical History:  Diagnosis Date  . Allergy   . Anemia   . Anxiety   . Aortic valve stenosis, severe   . Breast cancer ([place]) 2007   right  . Cataract   . Hiatal hernia   . Hypercholesteremia   . Hypoglycemia   . Osteopenia   . Personal history of radiation therapy   . Tingling   . Ulcer 2012   bleeding gastric Ulcer    Past Surgical History:  Procedure Laterality Date  . AORTIC VALVE REPLACEMENT N/A 11/16/2014   Procedure: AORTIC VALVE REPLACEMENT (AVR);  Surgeon: Gaye Pollack, MD;  Location: [facility];  Service: Open Heart Surgery;  Laterality: N/A;  . BIOPSY  12/15/2017   Procedure: BIOPSY;  Surgeon: Rush Landmark Telford Nab., MD;  Location: [facility];  Service: Gastroenterology;;  . BREAST BIOPSY    . BREAST LUMPECTOMY Right    2005?  Marland Kitchen BREAST SURGERY    . CARDIAC CATHETERIZATION N/A 10/18/2014   Procedure: Right/Left Heart Cath and Coronary Angiography;  Surgeon: [name], MD;  Location: [facility];  Service: Cardiovascular;  Laterality: N/A;  . CORONARY ANGIOGRAPHY N/A 02/04/2019   Procedure: CORONARY ANGIOGRAPHY;  Surgeon: Nelva Bush, MD;  Location: [facility];  Service: Cardiovascular;  Laterality: N/A;  . COSMETIC SURGERY     face  . ESOPHAGOGASTRODUODENOSCOPY (EGD) WITH PROPOFOL N/A 12/15/2017   Procedure: ESOPHAGOGASTRODUODENOSCOPY (EGD) WITH PROPOFOL;  Surgeon: Rush Landmark Telford Nab., MD;  Location: [facility] [facility];  Service: Gastroenterology;  Laterality: N/A;  . PARATHYROIDECTOMY    . TEE WITHOUT CARDIOVERSION N/A 11/16/2014   Procedure: TRANSESOPHAGEAL ECHOCARDIOGRAM (TEE);  Surgeon: Gaye Pollack, MD;  Location: [facility];  Service: Open Heart Surgery;  Laterality: N/A;  . TONSILLECTOMY      Social History   Socioeconomic History  . Marital status: Unknown    Spouse name: Not on file  . Number of children: 3  . Years of  education: 16  . Highest education level: Bachelor's degree (e.g., BA, AB, BS)  Occupational History  . Occupation: Retired  Tobacco Use  . Smoking status: Former Smoker    Packs/day: 0.50    Years: 15.00    Pack years: 7.50    Types: Cigarettes    Start date: 29    Quit date: 1986    Years since quitting: 35.1  . Smokeless tobacco: Never Used  . Tobacco comment: quit smoking 1980  Substance and Sexual Activity  . Alcohol use: Yes    Alcohol/week: 7.0 standard drinks    Types: 7 Glasses of wine per week    Comment: wine nightly with dinner  .  Drug use: No  . Sexual activity: Not Currently  Other Topics Concern  . Not on file  Social History Narrative   ** Merged History Encounter **       Lives at [facility] with her husband. Right-handed. Caffeine use: 3-4 cups per day (some tea, mixes coffee with decaf).   Social Determinants of Health   Financial Resource Strain:   . Difficulty of Paying Living Expenses: Not on file  Food Insecurity:   . Worried About Charity fundraiser in the Last Year: Not on file  . Ran Out of Food in the Last Year: Not on file  Transportation Needs:   . Lack of Transportation (Medical): Not on file  . Lack of Transportation (Non-Medical): Not on file  Physical Activity: Sufficiently Active  . Days of Exercise per Week: 6 days  . Minutes of Exercise per Session: 30 min  Stress: Stress Concern Present  . Feeling of Stress : To some extent  Social Connections: Unknown  . Frequency of Communication with Friends and Family: Not on file  . Frequency of Social Gatherings with Friends and Family: Not on file  . Attends Religious Services: Not on file  . Active Member of Clubs or Organizations: Yes  . Attends Archivist Meetings: More than 4 times per year  . Marital Status: Not on file    Family History  Problem Relation Age of Onset  . Hypertension Mother   . Rectal cancer Mother   . Colon cancer Mother   . Kidney disease  Father   . Esophageal cancer Neg Hx   . Stomach cancer Neg Hx   . Colon polyps Neg Hx     Review of Systems  Constitutional: Negative for chills and fever.  Respiratory: Negative for cough, shortness of breath and wheezing.   Cardiovascular: Negative for chest pain, palpitations and leg swelling.  Neurological: Negative for light-headedness and headaches.       Objective:   Vitals:   06/23/19 1026  BP: (!) 144/68  Pulse: (!) 58  Resp: 16  Temp: 97.7 F (36.5 C)  SpO2: 97%   BP Readings from Last 3 Encounters:  06/23/19 (!) 144/68  04/19/19 (!) 156/68  02/17/19 130/70   Wt Readings from Last 3 Encounters:  06/23/19 157 lb (71.2 kg)  04/19/19 163 lb (73.9 kg)  02/17/19 160 lb (72.6 kg)   Body mass index is 26.13 kg/m.   Physical Exam    Constitutional: Appears well-developed and well-nourished. No distress.  HENT:  Head: Normocephalic and atraumatic.  Neck: Neck supple. No tracheal deviation present. No thyromegaly present.  No cervical lymphadenopathy Cardiovascular: Normal rate, regular rhythm and normal heart sounds.   No murmur heard. No carotid bruit .  No edema Pulmonary/Chest: Effort normal and breath sounds normal. No respiratory distress. No has no wheezes. No rales.  Skin: Skin is warm and dry. Not diaphoretic.  Psychiatric: Normal mood and affect. Behavior is normal.      Assessment & Plan:    See Problem List for Assessment and Plan of chronic medical problems.    This visit occurred during the SARS-CoV-2 public health emergency.  Safety protocols were in place, including screening questions prior to the visit, additional usage of staff PPE, and extensive cleaning of exam room while observing appropriate contact time as indicated for disinfecting solutions.

## 2019-06-23 NOTE — Assessment & Plan Note (Signed)
Chronic Has been taking 10 mg recently - was increased to 20mg   Last LDL above goal Increase crestor to 20 mg daily

## 2019-06-23 NOTE — Assessment & Plan Note (Signed)
Chronic BP well controlled Current regimen effective and well tolerated Continue current medications at current doses  

## 2019-06-23 NOTE — Assessment & Plan Note (Signed)
Lab Results  Component Value Date   HGBA1C 5.8 06/20/2019   Complaint with low sugar diet exercising

## 2019-06-23 NOTE — Assessment & Plan Note (Signed)
Chronic Taking chloral hydrate nightly - has been on it for years Well controlled continue

## 2019-06-23 NOTE — Assessment & Plan Note (Signed)
Chronic w/o angina Following with cardiology Continue current medications

## 2019-06-23 NOTE — Assessment & Plan Note (Addendum)
Cbc normal No evidence of bleeding Following with GI protonix BID

## 2019-06-30 ENCOUNTER — Telehealth: Payer: Self-pay

## 2019-06-30 NOTE — Telephone Encounter (Signed)
[name] called back to tell me that she was aware of [name] declining the [facility] services at this time. They have decided that the it was to much money to spend for the service after speaking with Chantel Post the office manager who also spoke with them.

## 2019-07-24 ENCOUNTER — Encounter: Payer: Self-pay | Admitting: Internal Medicine

## 2019-07-25 ENCOUNTER — Other Ambulatory Visit: Payer: Self-pay

## 2019-07-25 MED ORDER — CHLORAL HYDRATE CRYS: 10.0000 mL | CRYSTALS | Freq: Every evening | 1 refills | Status: DC | PRN

## 2019-07-27 ENCOUNTER — Ambulatory Visit (INDEPENDENT_AMBULATORY_CARE_PROVIDER_SITE_OTHER): Payer: Medicare Other | Admitting: Podiatry

## 2019-07-27 ENCOUNTER — Other Ambulatory Visit: Payer: Self-pay

## 2019-07-27 DIAGNOSIS — M79676 Pain in unspecified toe(s): Secondary | ICD-10-CM | POA: Diagnosis not present

## 2019-07-27 DIAGNOSIS — B351 Tinea unguium: Secondary | ICD-10-CM

## 2019-07-27 DIAGNOSIS — L309 Dermatitis, unspecified: Secondary | ICD-10-CM | POA: Insufficient documentation

## 2019-08-03 NOTE — Progress Notes (Signed)
   SUBJECTIVE Patient presents to office today complaining of elongated, thickened nails that cause pain while ambulating in shoes. She reports yellow discoloration of the nails. She is unable to trim her own nails. She denies trauma. Patient is here for further evaluation and treatment.  Past Medical History:  Diagnosis Date  . Allergy   . Anemia   . Anxiety   . Aortic valve stenosis, severe   . Breast cancer ([place]) 2007   right  . Cataract   . Hiatal hernia   . Hypercholesteremia   . Hypoglycemia   . Osteopenia   . Personal history of radiation therapy   . Tingling   . Ulcer 2012   bleeding gastric Ulcer    OBJECTIVE General Patient is awake, alert, and oriented x 3 and in no acute distress. Derm Skin is dry and supple bilateral. Negative open lesions or macerations. Remaining integument unremarkable. Nails are tender, long, thickened and dystrophic with subungual debris, consistent with onychomycosis, 1-5 bilateral. No signs of infection noted. Vasc  DP and PT pedal pulses palpable bilaterally. Temperature gradient within normal limits.  Neuro Epicritic and protective threshold sensation grossly intact bilaterally.  Musculoskeletal Exam No symptomatic pedal deformities noted bilateral. Muscular strength within normal limits.  ASSESSMENT 1. Onychodystrophic nails 1-5 bilateral with hyperkeratosis of nails.  2. Onychomycosis of nail due to dermatophyte bilateral 3. Pain in foot bilateral  PLAN OF CARE 1. Patient evaluated today.  2. Instructed to maintain good pedal hygiene and foot care.  3. Mechanical debridement of nails 1-5 bilaterally performed using a nail nipper. Filed with dremel without incident.  4. OTC Formula 3 antifungal solution provided to patient.  5. Return to clinic as needed.    Edrick Kins, DPM [facility]  Dr. Edrick Kins, [place]                                        [place], [place] [address]                  Office [phone]  Fax [phone]

## 2019-08-08 DIAGNOSIS — J309 Allergic rhinitis, unspecified: Secondary | ICD-10-CM | POA: Diagnosis not present

## 2019-08-11 ENCOUNTER — Other Ambulatory Visit: Payer: Self-pay

## 2019-08-11 ENCOUNTER — Ambulatory Visit (INDEPENDENT_AMBULATORY_CARE_PROVIDER_SITE_OTHER): Payer: Medicare Other | Admitting: Cardiovascular Disease

## 2019-08-11 ENCOUNTER — Encounter: Payer: Self-pay | Admitting: Cardiovascular Disease

## 2019-08-11 VITALS — BP 124/68 | HR 59 | Ht 65.0 in | Wt 161.8 lb

## 2019-08-11 DIAGNOSIS — Z952 Presence of prosthetic heart valve: Secondary | ICD-10-CM

## 2019-08-11 DIAGNOSIS — I5032 Chronic diastolic (congestive) heart failure: Secondary | ICD-10-CM

## 2019-08-11 DIAGNOSIS — I2510 Atherosclerotic heart disease of native coronary artery without angina pectoris: Secondary | ICD-10-CM

## 2019-08-11 DIAGNOSIS — T82897D Other specified complication of cardiac prosthetic devices, implants and grafts, subsequent encounter: Secondary | ICD-10-CM

## 2019-08-11 NOTE — Progress Notes (Signed)
Cardiology Office Note:    Date:  08/11/2019   ID:  [name], DOB 06-30-1932, MRN [id number]  PCP:  [name], MD  Cardiologist:  [name], MD  Electrophysiologist:  None   Referring MD: [name], MD   Chief Complaint  Patient presents with  . Aortic Insuffiency    History of Present Illness:    [name] is a 86 y.o. female with a hx of severe bicuspid aortic stenosis who underwent bioprosthetic aortic valve replacement in 2016.  She has had normal coronary arteries at catheterization prior to her surgery.  The patient presented in October 2020 with non-STEMI and underwent cardiac catheterization demonstrating total occlusion of the distal LAD felt to be potentially an embolic event.  She had anteroapical akinesis on her echocardiogram with reduced LVEF of 45 to 50%.  She is also been followed for a mild to moderate paravalvular leak ever since surgery.  She is here alone today. She is doing well and walking 30 minutes/daily without exertional symptoms.  She specifically denies chest pain, chest pressure, shortness of breath, heart palpitations, lightheadedness, or syncope.  Past Medical History:  Diagnosis Date  . Allergy   . Anemia   . Anxiety   . Aortic valve stenosis, severe   . Breast cancer ([place]) 2007   right  . Cataract   . Hiatal hernia   . Hypercholesteremia   . Hypoglycemia   . Osteopenia   . Personal history of radiation therapy   . Tingling   . Ulcer 2012   bleeding gastric Ulcer    Past Surgical History:  Procedure Laterality Date  . AORTIC VALVE REPLACEMENT N/A 11/16/2014   Procedure: AORTIC VALVE REPLACEMENT (AVR);  Surgeon: Gaye Pollack, MD;  Location: [place];  Service: Open Heart Surgery;  Laterality: N/A;  . BIOPSY  12/15/2017   Procedure: BIOPSY;  Surgeon: Rush Landmark Telford Nab., MD;  Location: [place];  Service: Gastroenterology;;  . BREAST BIOPSY    . BREAST LUMPECTOMY Right    2005?  Marland Kitchen BREAST  SURGERY    . CARDIAC CATHETERIZATION N/A 10/18/2014   Procedure: Right/Left Heart Cath and Coronary Angiography;  Surgeon: [name], MD;  Location: [place] [facility];  Service: Cardiovascular;  Laterality: N/A;  . CORONARY ANGIOGRAPHY N/A 02/04/2019   Procedure: CORONARY ANGIOGRAPHY;  Surgeon: Nelva Bush, MD;  Location: [place] [facility];  Service: Cardiovascular;  Laterality: N/A;  . COSMETIC SURGERY     face  . ESOPHAGOGASTRODUODENOSCOPY (EGD) WITH PROPOFOL N/A 12/15/2017   Procedure: ESOPHAGOGASTRODUODENOSCOPY (EGD) WITH PROPOFOL;  Surgeon: Rush Landmark Telford Nab., MD;  Location: [place];  Service: Gastroenterology;  Laterality: N/A;  . PARATHYROIDECTOMY    . TEE WITHOUT CARDIOVERSION N/A 11/16/2014   Procedure: TRANSESOPHAGEAL ECHOCARDIOGRAM (TEE);  Surgeon: Gaye Pollack, MD;  Location: [place];  Service: Open Heart Surgery;  Laterality: N/A;  . TONSILLECTOMY      Current Medications: Current Meds  Medication Sig  . acetaminophen (TYLENOL) 500 MG tablet Take 500-1,000 mg by mouth at bedtime as needed for mild pain or moderate pain.  Marland Kitchen ALPRAZolam (XANAX) 0.5 MG tablet TAKE ONE TABLET BY MOUTH DAILY AS NEEDED FOR ANXIETY *DO NOT TAKE AT NIGHT*  . aspirin EC 81 MG EC tablet Take 1 tablet (81 mg total) by mouth daily.  . Calcium Carb-Cholecalciferol (CALCIUM 600+D3) 600-400 MG-UNIT TABS Take 1 tablet by mouth daily.  . Chloral Hydrate CRYS Take 10 mLs by mouth at bedtime as needed (Call  liquid into [facility]).  . Cholecalciferol (VITAMIN D-3) 1000 UNITS CAPS Take 1 capsule by mouth daily.   . clindamycin (CLEOCIN) 300 MG capsule Take 300 mg by mouth as directed. Take 600 mg (2 capsules) by mouth one hour prior to dental procedures   . Coenzyme Q10 (CO Q 10 PO) Take 1 capsule by mouth daily.  . ferrous gluconate (FERGON) 324 MG tablet Take 324 mg by mouth 3 (three) times a week. M,W,F  . fluticasone (FLONASE) 50 MCG/ACT nasal spray Place 2 sprays into both  nostrils daily.  Marland Kitchen levocetirizine (XYZAL) 5 MG tablet Take 5 mg by mouth at bedtime as needed for allergies.   Marland Kitchen losartan (COZAAR) 25 MG tablet Take 1 tablet (25 mg total) by mouth daily.  . Magnesium 400 MG CAPS Take 400 mg by mouth every morning.   . nitroGLYCERIN (NITROSTAT) 0.4 MG SL tablet Place 1 tablet (0.4 mg total) under the tongue every 5 (five) minutes as needed for chest pain (CP or SOB).  Marland Kitchen Ophthalmic Irrigation Solution (OCUSOFT EYE [place] OP) Apply 1 application to eye 2 (two) times daily as needed (dry eyes). Morning and before bed  . pantoprazole (PROTONIX) 40 MG tablet Take 1 tablet (40 mg total) by mouth 2 (two) times daily.  . rosuvastatin (CRESTOR) 20 MG tablet Take 1 tablet (20 mg total) by mouth daily.  Marland Kitchen tretinoin (RETIN-A) 0.05 % cream Apply 1 application topically 3 (three) times a week.   . Wheat Dextrin (BENEFIBER DRINK MIX PO) Take 5 mLs by mouth every morning. Mix with 1/2 a glass water, and takes with vitamins     Allergies:   Amoxicillin-pot clavulanate, Penicillin g, Penicillins, Sulfa antibiotics, Sulfamethoxazole, and Sulfonamide derivatives   Social History   Socioeconomic History  . Marital status: Unknown    Spouse name: Not on file  . Number of children: 3  . Years of education: 16  . Highest education level: Bachelor's degree (e.g., BA, AB, BS)  Occupational History  . Occupation: Retired  Tobacco Use  . Smoking status: Former Smoker    Packs/day: 0.50    Years: 15.00    Pack years: 7.50    Types: Cigarettes    Start date: 60    Quit date: 1986    Years since quitting: 35.3  . Smokeless tobacco: Never Used  . Tobacco comment: quit smoking 1980  Substance and Sexual Activity  . Alcohol use: Yes    Alcohol/week: 7.0 standard drinks    Types: 7 Glasses of wine per week    Comment: wine nightly with dinner  . Drug use: No  . Sexual activity: Not Currently  Other Topics Concern  . Not on file  Social History Narrative   ** Merged History  Encounter **       Lives at [facility] with her husband. Right-handed. Caffeine use: 3-4 cups per day (some tea, mixes coffee with decaf).   Social Determinants of Health   Financial Resource Strain:   . Difficulty of Paying Living Expenses:   Food Insecurity:   . Worried About Charity fundraiser in the Last Year:   . Arboriculturist in the Last Year:   Transportation Needs:   . Film/video editor (Medical):   Marland Kitchen Lack of Transportation (Non-Medical):   Physical Activity: Sufficiently Active  . Days of Exercise per Week: 6 days  . Minutes of Exercise per Session: 30 min  Stress: Stress Concern Present  . Feeling  of Stress : To some extent  Social Connections: Unknown  . Frequency of Communication with Friends and Family: Not on file  . Frequency of Social Gatherings with Friends and Family: Not on file  . Attends Religious Services: Not on file  . Active Member of Clubs or Organizations: Yes  . Attends Archivist Meetings: More than 4 times per year  . Marital Status: Not on file     Family History: The patient's family history includes Colon cancer in her mother; Hypertension in her mother; Kidney disease in her father; Rectal cancer in her mother. There is no history of Esophageal cancer, Stomach cancer, or Colon polyps.  ROS:   Please see the history of present illness.    All other systems reviewed and are negative.  EKGs/Labs/Other Studies Reviewed:    The following studies were reviewed today: Echo 02-04-2019: IMPRESSIONS    1. Left ventricular ejection fraction, by visual estimation, is 45 to  50%. The left ventricle has mildly decreased function. Normal left  ventricular size. There is mildly increased left ventricular hypertrophy.  2. Apical septal segment and apex are abnormal.  3. Left ventricular diastolic Doppler parameters are consistent with  pseudonormalization pattern of LV diastolic filling.  4. Global right ventricle has normal  systolic function.The right  ventricular size is normal. No increase in right ventricular wall  thickness.  5. Left atrial size was mildly dilated.  6. Right atrial size was normal.  7. The mitral valve is normal in structure. Mild mitral valve  regurgitation. No evidence of mitral stenosis.  8. The tricuspid valve is normal in structure. Tricuspid valve  regurgitation severe.  9. Aortic valve regurgitation is moderate by color flow Doppler. Mild  aortic valve sclerosis without stenosis.  10. Peak transaortic valve velocity 2.29m/s, mean gradient 69mmHg.  11. The pulmonic valve was normal in structure. Pulmonic valve  regurgitation is not visualized by color flow Doppler.  12. Moderately elevated pulmonary artery systolic pressure.  13. The inferior vena cava is normal in size with greater than 50%  respiratory variability, suggesting right atrial pressure of 3 mmHg.  14. Aortic Valve: A 64mm Magna bioprosthetic aortic valve   EKG:  EKG is not ordered today.    Recent Labs: 06/20/2019: ALT 17; BUN 14; Creatinine, Ser 0.82; Hemoglobin 13.6; Platelets 184.0; Potassium 3.7; Sodium 136; TSH 1.82  Recent Lipid Panel    Component Value Date/Time   CHOL 230 (H) 06/20/2019 0929   TRIG 73.0 06/20/2019 0929   HDL 94.90 06/20/2019 0929   CHOLHDL 2 06/20/2019 0929   VLDL 14.6 06/20/2019 0929   LDLCALC 120 (H) 06/20/2019 0929   LDLDIRECT 144.0 11/29/2012 1601    Physical Exam:    VS:  BP 124/68 (BP Location: Left Arm, Patient Position: Sitting, Cuff Size: Normal)   Pulse (!) 59   Ht 5\' 5"  (1.651 m)   Wt 161 lb 12.8 oz (73.4 kg)   SpO2 97%   BMI 26.92 kg/m     Wt Readings from Last 3 Encounters:  08/11/19 161 lb 12.8 oz (73.4 kg)  06/23/19 157 lb (71.2 kg)  04/19/19 163 lb (73.9 kg)     GEN:  Well nourished, well developed in no acute distress HEENT: Normal NECK: No JVD; No carotid bruits LYMPHATICS: No lymphadenopathy CARDIAC: RRR, 2/6 diastolic decrescendo murmur at the  left lower sternal border RESPIRATORY:  Clear to auscultation without rales, wheezing or rhonchi  ABDOMEN: Soft, non-tender, non-distended MUSCULOSKELETAL:  No edema; No  deformity  SKIN: Warm and dry NEUROLOGIC:  Alert and oriented x 3 PSYCHIATRIC:  Normal affect   ASSESSMENT:    1. S/P AVR   2. Chronic diastolic heart failure ([place])   3. Aortic prosthetic valve regurgitation, subsequent encounter    PLAN:    In order of problems listed above:  1. The patient is clinically stable.  I personally reviewed her echo images from October 2020 and I agree that she has at least mild to moderate paravalvular regurgitation.  She remains clinically asymptomatic.  There is no evidence of hemolysis.  I have recommended a repeat echocardiogram in October and a follow-up visit at that time.  She will otherwise continue on her current medical program. 2. No functional limitation at present.  No orthopnea, PND, or exertional dyspnea.  Will repeat an echocardiogram at the time of her follow-up visit.  She is treated with losartan  Overall I think the patient is doing very well.  We had a lengthy discussion today about the natural history of bioprosthetic aortic valve regurgitation.  She is not showing any signs of severe regurgitation as there is no hemolysis, her pulse pressure is normal, her LV size is normal, and she has no active heart failure symptoms.  Again I will see her back in 6 months.   Medication Adjustments/Labs and Tests Ordered: Current medicines are reviewed at length with the patient today.  Concerns regarding medicines are outlined above.  Orders Placed This Encounter  Procedures  . ECHOCARDIOGRAM COMPLETE   No orders of the defined types were placed in this encounter.   Patient Instructions  Medication Instructions:  Your provider recommends that you continue on your current medications as directed. Please refer to the Current Medication list given to you today.   *If you need a  refill on your cardiac medications before your next appointment, please call your pharmacy*    Follow-Up: You will be called to arrange your 6 month echo and office visit!    Signed, [name], MD  08/11/2019 11:27 AM    [place]

## 2019-08-11 NOTE — Patient Instructions (Addendum)
Medication Instructions:  Your provider recommends that you continue on your current medications as directed. Please refer to the Current Medication list given to you today.   *If you need a refill on your cardiac medications before your next appointment, please call your pharmacy*    Follow-Up: You will be called to arrange your 6 month echo and office visit!

## 2019-08-12 DIAGNOSIS — H04123 Dry eye syndrome of bilateral lacrimal glands: Secondary | ICD-10-CM | POA: Diagnosis not present

## 2019-08-12 DIAGNOSIS — H348312 Tributary (branch) retinal vein occlusion, right eye, stable: Secondary | ICD-10-CM | POA: Diagnosis not present

## 2019-08-12 DIAGNOSIS — D3141 Benign neoplasm of right ciliary body: Secondary | ICD-10-CM | POA: Diagnosis not present

## 2019-08-12 DIAGNOSIS — Z961 Presence of intraocular lens: Secondary | ICD-10-CM | POA: Diagnosis not present

## 2019-08-12 DIAGNOSIS — H02403 Unspecified ptosis of bilateral eyelids: Secondary | ICD-10-CM | POA: Diagnosis not present

## 2019-08-12 DIAGNOSIS — H35371 Puckering of macula, right eye: Secondary | ICD-10-CM | POA: Diagnosis not present

## 2019-08-24 ENCOUNTER — Ambulatory Visit: Payer: Medicare Other | Admitting: Physician Assistant

## 2019-08-25 ENCOUNTER — Other Ambulatory Visit: Payer: Self-pay | Admitting: Internal Medicine

## 2019-08-25 NOTE — Telephone Encounter (Signed)
Last RF 06/07/19 Last OV 06/23/19 Next OV 12/23/19

## 2019-09-15 ENCOUNTER — Inpatient Hospital Stay (HOSPITAL_BASED_OUTPATIENT_CLINIC_OR_DEPARTMENT_OTHER): Payer: Medicare Other | Admitting: Adult Health

## 2019-09-15 ENCOUNTER — Telehealth: Payer: Self-pay | Admitting: Adult Health

## 2019-09-15 ENCOUNTER — Other Ambulatory Visit: Payer: Self-pay

## 2019-09-15 NOTE — Telephone Encounter (Signed)
Called pt per 5/20 sch message - unable to reach pt left message for patient to call back for reschedule.

## 2019-09-20 DIAGNOSIS — Z961 Presence of intraocular lens: Secondary | ICD-10-CM | POA: Diagnosis not present

## 2019-09-20 DIAGNOSIS — H35371 Puckering of macula, right eye: Secondary | ICD-10-CM | POA: Diagnosis not present

## 2019-09-20 DIAGNOSIS — D3141 Benign neoplasm of right ciliary body: Secondary | ICD-10-CM | POA: Diagnosis not present

## 2019-09-20 DIAGNOSIS — H348312 Tributary (branch) retinal vein occlusion, right eye, stable: Secondary | ICD-10-CM | POA: Diagnosis not present

## 2019-09-20 DIAGNOSIS — H04123 Dry eye syndrome of bilateral lacrimal glands: Secondary | ICD-10-CM | POA: Diagnosis not present

## 2019-09-20 DIAGNOSIS — H02403 Unspecified ptosis of bilateral eyelids: Secondary | ICD-10-CM | POA: Diagnosis not present

## 2019-09-24 NOTE — Progress Notes (Signed)
error 

## 2019-10-04 ENCOUNTER — Telehealth: Payer: Self-pay

## 2019-10-04 ENCOUNTER — Other Ambulatory Visit: Payer: Self-pay

## 2019-10-04 ENCOUNTER — Telehealth: Payer: Self-pay | Admitting: Nurse Practitioner

## 2019-10-04 ENCOUNTER — Encounter: Payer: Self-pay | Admitting: Nurse Practitioner

## 2019-10-04 ENCOUNTER — Inpatient Hospital Stay: Payer: Medicare Other | Attending: Nurse Practitioner | Admitting: Nurse Practitioner

## 2019-10-04 VITALS — BP 140/54 | HR 57 | Temp 97.3°F | Resp 17 | Ht 65.0 in | Wt 160.6 lb

## 2019-10-04 DIAGNOSIS — M8580 Other specified disorders of bone density and structure, unspecified site: Secondary | ICD-10-CM | POA: Insufficient documentation

## 2019-10-04 DIAGNOSIS — Z170 Estrogen receptor positive status [ER+]: Secondary | ICD-10-CM | POA: Diagnosis not present

## 2019-10-04 DIAGNOSIS — Z8041 Family history of malignant neoplasm of ovary: Secondary | ICD-10-CM | POA: Diagnosis not present

## 2019-10-04 DIAGNOSIS — Z7982 Long term (current) use of aspirin: Secondary | ICD-10-CM | POA: Insufficient documentation

## 2019-10-04 DIAGNOSIS — Z853 Personal history of malignant neoplasm of breast: Secondary | ICD-10-CM | POA: Insufficient documentation

## 2019-10-04 DIAGNOSIS — Z9223 Personal history of estrogen therapy: Secondary | ICD-10-CM | POA: Insufficient documentation

## 2019-10-04 DIAGNOSIS — Z79899 Other long term (current) drug therapy: Secondary | ICD-10-CM | POA: Diagnosis not present

## 2019-10-04 DIAGNOSIS — Z803 Family history of malignant neoplasm of breast: Secondary | ICD-10-CM | POA: Diagnosis not present

## 2019-10-04 DIAGNOSIS — F419 Anxiety disorder, unspecified: Secondary | ICD-10-CM | POA: Diagnosis not present

## 2019-10-04 DIAGNOSIS — E7800 Pure hypercholesterolemia, unspecified: Secondary | ICD-10-CM | POA: Insufficient documentation

## 2019-10-04 DIAGNOSIS — Z923 Personal history of irradiation: Secondary | ICD-10-CM | POA: Insufficient documentation

## 2019-10-04 DIAGNOSIS — Z800 Family history of malignant neoplasm of digestive organs: Secondary | ICD-10-CM | POA: Insufficient documentation

## 2019-10-04 NOTE — Telephone Encounter (Signed)
Per Cira Rue NP faxed her note from today to Dr. Philis Pique at [facility] ob/gyn Phone# [phone] Fax # [phone]

## 2019-10-04 NOTE — Telephone Encounter (Signed)
-----  Message from Irving Copas., MD sent at 10/04/2019  4:29 PM EDT ----- LB, Sounds good. Zeno Hickel, please reach out to the patient and set her up for a clinic visit to discuss things further. This visit should be with me to discuss things further. Thanks. GM ----- Message ----- From: Alla Feeling, NP Sent: 10/04/2019   4:26 PM EDT To: Irving Copas., MD  She seemed ready to pull the trigger. Thanks for taking care of her. LB ----- Message ----- From: Irving Copas., MD Sent: 10/04/2019   4:02 PM EDT To: Alla Feeling, NP  Regan Rakers, Thank you for letting me know. Have tried to get her to consider 1 last colonoscopy, but she has been hesitant. I am happy to discuss with her again at next visit. Thanks. GM ----- Message ----- From: Alla Feeling, NP Sent: 10/04/2019  11:01 AM EDT To: Irving Copas., MD  Team,  I saw Ms. Weatherwax in long term survivorship for h/o breast cancer, doing very well no concerns. She has maternal cousin with breast cancer newly diagnosed with ovarian cancer. Also pt's mother had colorectal cancer. Patient reportedly BRCA neg through an independent lab 15 years ago. She plans to see GI and GYN to discuss her personal risk and recommendations for screening. Currently no red flags. She is 27 but otherwise healthy.  Thanks, Cira Rue NP

## 2019-10-04 NOTE — Progress Notes (Signed)
Patient Care Team: [name], MD as PCP - General (Internal Medicine) [name], MD as PCP - Cardiology (Cardiology) Pyrtle, Lajuan Lines, MD as Consulting Physician (Gastroenterology) [name], MD as Consulting Physician (Cardiology) Nicholas Lose, MD as Consulting Physician (Hematology and Oncology) Feliz Beam, MD as Referring Physician (Ophthalmology) Marcial Pacas, MD as Consulting Physician (Neurology) Mansouraty, Telford Nab., MD as Consulting Physician (Gastroenterology)  CLINIC:  Survivorship   REASON FOR VISIT:  Routine follow-up post-treatment for a history of breast cancer.  BRIEF ONCOLOGIC HISTORY:  Oncology History  History of right breast cancer  02/16/2004 Initial Diagnosis   Right breast mass biopsy: Invasive mammary cancer, MRI revealed 1.3 cm mass upper inner quadrant right breast   03/05/2004 Surgery   Right lumpectomy: 0.9 cm grade 1 invasive lobular cancer, ER positive, PR negative, HER-2 negative, Ki-67 5%, sentinel lymph nodes negative   04/08/2004 - 05/09/2004 Radiation Therapy   Adjuvant radiation therapy   06/10/2004 - 06/09/2009 Anti-estrogen oral therapy   MA-27 trial and placed on the Aromasin arm. She completed one year of Aromasin, and it was discontinued due to severe joint aches and pains, followed by Tamoxifen for 2-3 years, and then Letrozole to complete out a total of 5 years     INTERVAL HISTORY:  [name] presents to the [facility] today for long term follow up for her h/o breast cancer.   Overall, [name] reports feeling well in general. Her husband had COVID this year and recovered. She is vaccinated. Appetite and energy are normal. Takes calcium and vitamin D, walks daily with husband at [employer] Denies fever, chills or recent infection. Denies change in bowel habits, abdominal/pelvic pain or bloating, or any bleeding such as rectal or vaginal bleeding, hematuria or epistaxis. Denies hot flash or new  bone/joint pain. She has a seasonal cough and allergies, no chest pain or dyspnea. She has what she thinks is a pimple on left breast. No palpable lump or nipple change. A maternal cousin who had breast cancer was recently diagnosed with ovarian cancer at age 54. Patient notes she was checked for BRCA 15 years ago and she is negative.      PAST MEDICAL/SURGICAL HISTORY:  Past Medical History:  Diagnosis Date  . Allergy   . Anemia   . Anxiety   . Aortic valve stenosis, severe   . Breast cancer ([place]) 2007   right  . Cataract   . Hiatal hernia   . Hypercholesteremia   . Hypoglycemia   . Osteopenia   . Personal history of radiation therapy   . Tingling   . Ulcer 2012   bleeding gastric Ulcer   Past Surgical History:  Procedure Laterality Date  . AORTIC VALVE REPLACEMENT N/A 11/16/2014   Procedure: AORTIC VALVE REPLACEMENT (AVR);  Surgeon: Gaye Pollack, MD;  Location: [place];  Service: Open Heart Surgery;  Laterality: N/A;  . BIOPSY  12/15/2017   Procedure: BIOPSY;  Surgeon: Rush Landmark Telford Nab., MD;  Location: [place];  Service: Gastroenterology;;  . BREAST BIOPSY    . BREAST LUMPECTOMY Right    2005?  Marland Kitchen BREAST SURGERY    . CARDIAC CATHETERIZATION N/A 10/18/2014   Procedure: Right/Left Heart Cath and Coronary Angiography;  Surgeon: [name], MD;  Location: [place] [facility];  Service: Cardiovascular;  Laterality: N/A;  . CORONARY ANGIOGRAPHY N/A 02/04/2019   Procedure: CORONARY ANGIOGRAPHY;  Surgeon: Nelva Bush, MD;  Location: [place] [facility];  Service: Cardiovascular;  Laterality:  N/A;  . COSMETIC SURGERY     face  . ESOPHAGOGASTRODUODENOSCOPY (EGD) WITH PROPOFOL N/A 12/15/2017   Procedure: ESOPHAGOGASTRODUODENOSCOPY (EGD) WITH PROPOFOL;  Surgeon: Rush Landmark Telford Nab., MD;  Location: [place];  Service: Gastroenterology;  Laterality: N/A;  . PARATHYROIDECTOMY    . TEE WITHOUT CARDIOVERSION N/A 11/16/2014   Procedure: TRANSESOPHAGEAL ECHOCARDIOGRAM  (TEE);  Surgeon: Gaye Pollack, MD;  Location: [place];  Service: Open Heart Surgery;  Laterality: N/A;  . TONSILLECTOMY       ALLERGIES:  Allergies  Allergen Reactions  . Amoxicillin-Pot Clavulanate Hives and Rash  . Penicillin G Rash  . Penicillins Hives and Rash  . Sulfa Antibiotics Rash  . Sulfamethoxazole Rash  . Sulfonamide Derivatives Hives     CURRENT MEDICATIONS:  Outpatient Encounter Medications as of 10/04/2019  Medication Sig  . acetaminophen (TYLENOL) 500 MG tablet Take 500-1,000 mg by mouth at bedtime as needed for mild pain or moderate pain.  Marland Kitchen ALPRAZolam (XANAX) 0.5 MG tablet TAKE 1 TABLET ONCE DAILY AS NEEDED FOR AN ANXIETY, DO NOT TAKE AT NIGHT.  Marland Kitchen aspirin EC 81 MG EC tablet Take 1 tablet (81 mg total) by mouth daily.  . Calcium Carb-Cholecalciferol (CALCIUM 600+D3) 600-400 MG-UNIT TABS Take 1 tablet by mouth daily.  . Chloral Hydrate CRYS Take 10 mLs by mouth at bedtime as needed (Call  liquid into [place]).  . Cholecalciferol (VITAMIN D-3) 1000 UNITS CAPS Take 1 capsule by mouth daily.   . Coenzyme Q10 (CO Q 10 PO) Take 1 capsule by mouth daily.  . ferrous gluconate (FERGON) 324 MG tablet Take 324 mg by mouth 3 (three) times a week. M,W,F  . fluticasone (FLONASE) 50 MCG/ACT nasal spray Place 2 sprays into both nostrils daily.  . Magnesium 400 MG CAPS Take 400 mg by mouth every morning.   Marland Kitchen Ophthalmic Irrigation Solution (OCUSOFT EYE [place] OP) Apply 1 application to eye 2 (two) times daily as needed (dry eyes). Morning and before bed  . pantoprazole (PROTONIX) 40 MG tablet Take 1 tablet (40 mg total) by mouth 2 (two) times daily.  . rosuvastatin (CRESTOR) 20 MG tablet Take 1 tablet (20 mg total) by mouth daily.  . Wheat Dextrin (BENEFIBER DRINK MIX PO) Take 5 mLs by mouth every morning. Mix with 1/2 a glass water, and takes with vitamins  . clindamycin (CLEOCIN) 300 MG capsule Take 300 mg by mouth as directed. Take 600 mg (2 capsules) by mouth one  hour prior to dental procedures   . levocetirizine (XYZAL) 5 MG tablet Take 5 mg by mouth at bedtime as needed for allergies.   Marland Kitchen losartan (COZAAR) 25 MG tablet Take 1 tablet (25 mg total) by mouth daily.  . nitroGLYCERIN (NITROSTAT) 0.4 MG SL tablet Place 1 tablet (0.4 mg total) under the tongue every 5 (five) minutes as needed for chest pain (CP or SOB).  Marland Kitchen tretinoin (RETIN-A) 0.05 % cream Apply 1 application topically 3 (three) times a week.    No facility-administered encounter medications on file as of 10/04/2019.     ONCOLOGIC FAMILY HISTORY:  Family History  Problem Relation Age of Onset  . Hypertension Mother   . Rectal cancer Mother   . Colon cancer Mother   . Kidney disease Father   . Cancer Cousin 63       breast and ovarian   . Esophageal cancer Neg Hx   . Stomach cancer Neg Hx   . Colon polyps Neg Hx  GENETIC COUNSELING/TESTING: Per patient, BRCA negative. I do not have the report   SOCIAL HISTORY:  Winnell Bento is married and lives with her spouse at [facility] in [place], [place].  She has 3 children.  She denies any current or history of tobacco or illicit drug use. Drinks 1 glass of wine per night.      PHYSICAL EXAMINATION:  Vital Signs:   Vitals:   10/04/19 0959  BP: (!) 140/54  Pulse: (!) 57  Resp: 17  Temp: (!) 97.3 F (36.3 C)  SpO2: 99%   Filed Weights   10/04/19 0959  Weight: 160 lb 9.6 oz (72.8 kg)   General: Well-nourished, well-appearing female in no acute distress.   HEENT:  Sclerae anicteric.  Lymph: No cervical, supraclavicular, or infraclavicular lymphadenopathy noted on palpation.  Cardiovascular: Regular rate and rhythm Respiratory: Clear to auscultation bilaterally; breathing non-labored.  GI: Abdomen non-distended.  Neuro: No focal deficits. Steady gait.  Psych: Mood and affect normal and appropriate for situation.  Extremities: No edema. MSK: No focal spinal tenderness to palpation.  Full range of motion  in bilateral upper extremities Skin: Warm and dry. Breast: s/p right lumpectomy and radiation. No palpable mass in either breast or axilla that I could appreciate.   LABORATORY DATA:  None for this visit.  DIAGNOSTIC IMAGING:  None for this visit.      ASSESSMENT AND PLAN:  Ms.. [name] is a pleasant 86 y.o. female with history of Stage IA right breast invasive lobular carcinoma, ER+/PR+/HER2-, diagnosed in 2005, treated with lumpectomy, adjuvant radiation therapy, and anti-estrogen therapy x5 years completed in 2011. She [facility] for surveillance and routine follow up.   1. History of breast cancer:  Ms. Appleby is clinically doing well. Breast exam benign. CBC and CMP from 05/2019 WNL. Last mammogram in 02/2019 was negative for evidence of malignancy. No clinical or radiographic concern for recurrence. She is due for annual mammogram in 02/2020. She prefers to continue long term follow up in our survivorship clinic. She will return in 09/2020. She knows to contact the office sooner if she develops new concerns or questions.   2. Bone health:  Last DEXA scan was 12/20/2018 which showed osteopenia with high FRAX score, managed by PCP Dr. Landry Dyke. She is currently taking daily calcium and vitamin D. She was encouraged to increase her weight-bearing activities and I reviewed what this entails, she walks daily. The recommendation is to repeat every 2 years.   3. Cancer screening:  Due to Ms. Shatz's history, age, and family history, she should receive screening for skin cancer and use shared decision making with specialists to determine whether she should undergo colon and gynecological cancer screenings. She is followed by Dr. Rush Landmark of GI. She has a maternal cousin who had breast and now has ovarian cancer. Ms. Zeisler has no abnormal vaginal bleeding, pelvic pain or bloating, weight loss, or concerning clinical symptoms; however she is questioning if she  should undergo ovarian cancer screening. She will see Dr. Philis Pique at [facility] soon for routine f/u. I reviewed in some cases pelvic ultrasound and CA 125 are used for gyn cancer screening. I do not feel these tests are necessary for Ms. Sedler at this time but encouraged her to discuss her concerns with Dr. Philis Pique. Also of note, the patient states she was checked for BRCA 15 years ago through an independent lab and is negative. She no longer has the report.   4.  Health maintenance and wellness promotion: Ms. Graw was encouraged to consume 5-7 servings of fruits and vegetables per day.  She was also encouraged to engage in moderate exercise appropriate for her age and activity level for 30 minutes per day most days of the week. She was instructed to limit her alcohol consumption and continue to abstain from tobacco use. She is up to date on age appropriate vaccines including flu and covid vaccines.    5. Support services/counseling: No indication for SW referral at this time.  Dispo:   -Return to cancer center 09/2020 for long term survivorship  -Mammogram due in 02/2020 -continue routine f/u with PCP, GI, GYN  -CC note to care team and include genetic counselor   A total of (40) minutes of face-to-face time was spent with this patient with greater than 50% of that time in counseling and care-coordination.   Cira Rue, NP Survivorship Program [facility] [phone]   Note: PRIMARY CARE PROVIDER [name], [place] [phone]

## 2019-10-04 NOTE — Telephone Encounter (Signed)
Scheduled appt per 6/8 los.  Printed calendar and avs.

## 2019-10-04 NOTE — Telephone Encounter (Signed)
The pt has an appt on 8/10 with Dr Rush Landmark previously scheduled and pt aware

## 2019-10-19 ENCOUNTER — Other Ambulatory Visit: Payer: Self-pay | Admitting: Internal Medicine

## 2019-10-19 ENCOUNTER — Telehealth: Payer: Self-pay

## 2019-10-19 NOTE — Telephone Encounter (Signed)
Asking for a call back when the rx is called in  - the pharmacy can delivered it her home   1.Medication Requested: Hydrocodone/homatropines 120 ml  2. Pharmacy (Name, [place], City):[facility], [place]  3. On Med List: no   4. Last Visit with PCP: 2.25.21   5. Next visit date with PCP: 8.27.21    Agent: Please be advised that RX refills may take up to 3 business days. We ask that you follow-up with your pharmacy.

## 2019-10-19 NOTE — Telephone Encounter (Signed)
Is she having cold symptoms?

## 2019-10-19 NOTE — Telephone Encounter (Signed)
Sent to pharmacy 

## 2019-10-21 ENCOUNTER — Telehealth (INDEPENDENT_AMBULATORY_CARE_PROVIDER_SITE_OTHER): Payer: Medicare Other | Admitting: Family

## 2019-10-21 DIAGNOSIS — J019 Acute sinusitis, unspecified: Secondary | ICD-10-CM

## 2019-10-21 MED ORDER — DOXYCYCLINE HYCLATE 100 MG PO TABS
100.0000 mg | ORAL_TABLET | Freq: Two times a day (BID) | ORAL | 0 refills | Status: DC
Start: 2019-10-21 — End: 2019-12-07

## 2019-10-21 NOTE — Progress Notes (Signed)
[name] is a 86 y.o. female with the following history as recorded in EpicCare:  Patient Active Problem List   Diagnosis Date Noted  . Eczema 07/27/2019  . CAD (coronary artery disease) 06/23/2019  . Epistaxis 04/19/2019  . NSTEMI (non-ST elevated myocardial infarction) ([place])   . Iron deficiency 12/26/2018  . Anemia 12/26/2018  . B12 deficiency 12/23/2018  . Discoloration of skin of finger 11/11/2018  . Chronic gastric ulcer without hemorrhage and without perforation 06/05/2018  . Constipation, chronic 06/05/2018  . Callus of foot 03/04/2018  . Hammertoes of both feet 03/04/2018  . History of peptic ulcer disease 12/21/2017  . Hypertension 12/21/2017  . Syncope 12/14/2017  . Macrocytic anemia 12/14/2017  . Near syncope 12/14/2017  . Left sided numbness 10/19/2017  . Small vessel disease, cerebrovascular 10/19/2017  . Numbness and tingling of left arm and leg 10/13/2017  . Anxiety 10/02/2017  . Macular pucker, right eye 07/24/2017  . Pseudophakia of both eyes 07/24/2017  . Prediabetes 09/29/2016  . Lung nodule < 6cm on CT 09/29/2016  . Leg cramps 01/29/2016  . Insomnia 09/12/2015  . Rectal bleed 09/12/2015  . S/P AVR 11/16/2014  . Branch retinal vein occlusion 10/12/2013  . Iris nevus, right 10/12/2013  . Aortic valve stenosis, severe   . Allergic rhinitis 09/10/2009  . Hyperlipidemia 11/30/2006  . Mitral valve disease 10/29/2006  . Osteopenia 10/29/2006  . History of right breast cancer 10/29/2006    Current Outpatient Medications  Medication Sig Dispense Refill  . acetaminophen (TYLENOL) 500 MG tablet Take 500-1,000 mg by mouth at bedtime as needed for mild pain or moderate pain.    Marland Kitchen ALPRAZolam (XANAX) 0.5 MG tablet TAKE 1 TABLET ONCE DAILY AS NEEDED FOR AN ANXIETY, DO NOT TAKE AT NIGHT. 90 tablet 0  . aspirin EC 81 MG EC tablet Take 1 tablet (81 mg total) by mouth daily. 90 tablet 3  . Calcium Carb-Cholecalciferol (CALCIUM 600+D3) 600-400 MG-UNIT TABS  Take 1 tablet by mouth daily.    . Chloral Hydrate CRYS Take 10 mLs by mouth at bedtime as needed (Call  liquid into [place]). 900 g 1  . Cholecalciferol (VITAMIN D-3) 1000 UNITS CAPS Take 1 capsule by mouth daily.     . clindamycin (CLEOCIN) 300 MG capsule Take 300 mg by mouth as directed. Take 600 mg (2 capsules) by mouth one hour prior to dental procedures     . Coenzyme Q10 (CO Q 10 PO) Take 1 capsule by mouth daily.    Marland Kitchen doxycycline (VIBRA-TABS) 100 MG tablet Take 1 tablet (100 mg total) by mouth 2 (two) times daily. 20 tablet 0  . ferrous gluconate (FERGON) 324 MG tablet Take 324 mg by mouth 3 (three) times a week. M,W,F    . fluticasone (FLONASE) 50 MCG/ACT nasal spray Place 2 sprays into both nostrils daily. 16 g 6  . HYDROcodone-homatropine (HYCODAN) 5-1.5 MG/5ML syrup TAKE 5ML BY MOUTH EVERY 8 HOURS AS NEEDED FOR COUGH. 120 mL 0  . levocetirizine (XYZAL) 5 MG tablet Take 5 mg by mouth at bedtime as needed for allergies.     Marland Kitchen losartan (COZAAR) 25 MG tablet Take 1 tablet (25 mg total) by mouth daily. 90 tablet 3  . Magnesium 400 MG CAPS Take 400 mg by mouth every morning.     . nitroGLYCERIN (NITROSTAT) 0.4 MG SL tablet Place 1 tablet (0.4 mg total) under the tongue every 5 (five) minutes as needed for chest pain (CP or  SOB). 25 tablet 3  . Ophthalmic Irrigation Solution (OCUSOFT EYE [facility] OP) Apply 1 application to eye 2 (two) times daily as needed (dry eyes). Morning and before bed    . pantoprazole (PROTONIX) 40 MG tablet Take 1 tablet (40 mg total) by mouth 2 (two) times daily. 180 tablet 4  . rosuvastatin (CRESTOR) 20 MG tablet Take 1 tablet (20 mg total) by mouth daily. 90 tablet 3  . tretinoin (RETIN-A) 0.05 % cream Apply 1 application topically 3 (three) times a week.     . Wheat Dextrin (BENEFIBER DRINK MIX PO) Take 5 mLs by mouth every morning. Mix with 1/2 a glass water, and takes with vitamins     No current facility-administered medications for this visit.     Allergies: Amoxicillin-pot clavulanate, Penicillin g, Penicillins, Sulfa antibiotics, Sulfamethoxazole, and Sulfonamide derivatives  Past Medical History:  Diagnosis Date  . Allergy   . Anemia   . Anxiety   . Aortic valve stenosis, severe   . Breast cancer ([place]) 2007   right  . Cataract   . Hiatal hernia   . Hypercholesteremia   . Hypoglycemia   . Osteopenia   . Personal history of radiation therapy   . Tingling   . Ulcer 2012   bleeding gastric Ulcer    Past Surgical History:  Procedure Laterality Date  . AORTIC VALVE REPLACEMENT N/A 11/16/2014   Procedure: AORTIC VALVE REPLACEMENT (AVR);  Surgeon: Gaye Pollack, MD;  Location: [facility];  Service: Open Heart Surgery;  Laterality: N/A;  . BIOPSY  12/15/2017   Procedure: BIOPSY;  Surgeon: Rush Landmark Telford Nab., MD;  Location: [facility];  Service: Gastroenterology;;  . BREAST BIOPSY    . BREAST LUMPECTOMY Right    2005?  Marland Kitchen BREAST SURGERY    . CARDIAC CATHETERIZATION N/A 10/18/2014   Procedure: Right/Left Heart Cath and Coronary Angiography;  Surgeon: [name], MD;  Location: [place] [facility];  Service: Cardiovascular;  Laterality: N/A;  . CORONARY ANGIOGRAPHY N/A 02/04/2019   Procedure: CORONARY ANGIOGRAPHY;  Surgeon: Nelva Bush, MD;  Location: [place] [facility];  Service: Cardiovascular;  Laterality: N/A;  . COSMETIC SURGERY     face  . ESOPHAGOGASTRODUODENOSCOPY (EGD) WITH PROPOFOL N/A 12/15/2017   Procedure: ESOPHAGOGASTRODUODENOSCOPY (EGD) WITH PROPOFOL;  Surgeon: Rush Landmark Telford Nab., MD;  Location: [facility];  Service: Gastroenterology;  Laterality: N/A;  . PARATHYROIDECTOMY    . TEE WITHOUT CARDIOVERSION N/A 11/16/2014   Procedure: TRANSESOPHAGEAL ECHOCARDIOGRAM (TEE);  Surgeon: Gaye Pollack, MD;  Location: [facility];  Service: Open Heart Surgery;  Laterality: N/A;  . TONSILLECTOMY      Family History  Problem Relation Age of Onset  . Hypertension Mother   . Rectal cancer Mother   . Colon cancer  Mother   . Kidney disease Father   . Cancer Cousin 63       breast and ovarian   . Esophageal cancer Neg Hx   . Stomach cancer Neg Hx   . Colon polyps Neg Hx     Social History   Tobacco Use  . Smoking status: Former Smoker    Packs/day: 0.50    Years: 15.00    Pack years: 7.50    Types: Cigarettes    Start date: 50    Quit date: 1986    Years since quitting: 35.5  . Smokeless tobacco: Never Used  . Tobacco comment: quit smoking 1980  Substance Use Topics  . Alcohol use: Yes    Alcohol/week: 7.0  standard drinks    Types: 7 Glasses of wine per week    Comment: wine nightly with dinner    Subjective:   I connected with Le Ferraz Hulsey on 10/21/19 at  1:00 PM EDT by a telephone call and verified that I am speaking with the correct person using two identifiers.   I discussed the limitations of evaluation and management by telemedicine and the availability of in person appointments. The patient expressed understanding and agreed to proceed. Provider in office/ patient is at home; provider and patient are only 2 people on telephone call.   Complaining of sinus infection; is prone to recurrent sinus infections; requesting prescription for antibiotic; is using the Hycodan that her PCP called in 2 days ago and able to sleep last night/ getting some rest; no fever, chest pain or shortness of breath;     Objective:  There were no vitals filed for this visit.  Lungs: Respirations unlabored;  Neurologic: Alert and oriented; speech intact; face symmetrical; moves all extremities well; CNII-XII intact without focal deficit   Assessment:  1. Acute sinusitis, recurrence not specified, unspecified location     Plan:  Rx for Doxycycline 100 mg bid x 10 days; continue medications for symptom relief; increase fluids, rest and follow-up worse, no better.  Time spent 8 minutes;   No follow-ups on file.  No orders of the defined types were placed in this encounter.   Requested  Prescriptions   Signed Prescriptions Disp Refills  . doxycycline (VIBRA-TABS) 100 MG tablet 20 tablet 0    Sig: Take 1 tablet (100 mg total) by mouth 2 (two) times daily.

## 2019-11-22 ENCOUNTER — Other Ambulatory Visit: Payer: Self-pay | Admitting: Internal Medicine

## 2019-11-28 ENCOUNTER — Other Ambulatory Visit: Payer: Self-pay | Admitting: Internal Medicine

## 2019-11-29 ENCOUNTER — Telehealth: Payer: Self-pay | Admitting: Cardiovascular Disease

## 2019-11-29 NOTE — Telephone Encounter (Signed)
The patient states she feels better now. She will keep earlier echo appointment and travel plans as scheduled since she has no symptoms at rest or with low level activity. She will call prior  To echo if symptoms progress. She was grateful for follow-up.

## 2019-11-29 NOTE — Telephone Encounter (Signed)
Michalene - thanks for moving echo up - I agree with that. As long as she's not having resting symptoms or shortness of breath with low level activity (ADL's), I'm ok with her traveling to [place] this week. If progressive symptoms occur, she probably shouldn't travel and I would want to hear back from her.   Thx Ronalee Belts

## 2019-11-29 NOTE — Telephone Encounter (Signed)
Patient calling w new SOB while walking.  Normally no trouble but this week SOB -once outside on incline had to stop part way up and once in long hallway at [facility] and rested after 4 lengths of hallway.  Usually walks daily, not in hot temps.  No around anyone sick.  Has a occas cough, not new.  No chest pressure or pain with activity.   No swelling, lightheadedness or dizziness.  Chest hurting today thinks it is anxiety over the SOB. Describes "a little bit like pressure".   She is planning to drive to [place] to the beach to return Saturday.  Really wants to hear from Dr. Burt Knack that she is ok to do this.    Her last echo was 02/15/20.

## 2019-11-29 NOTE — Telephone Encounter (Signed)
New message  Pt c/o Shortness Of Breath: STAT if SOB developed within the last 24 hours or pt is noticeably SOB on the phone  1. Are you currently SOB (can you hear that pt is SOB on the phone)? Yes  2. How long have you been experiencing SOB? 3 days ago  3. Are you SOB when sitting or when up moving around? Up and moving around  4. Are you currently experiencing any other symptoms? No other symptoms just SOB

## 2019-12-06 ENCOUNTER — Ambulatory Visit (INDEPENDENT_AMBULATORY_CARE_PROVIDER_SITE_OTHER): Payer: Medicare Other | Admitting: Cardiology

## 2019-12-06 ENCOUNTER — Other Ambulatory Visit: Payer: Self-pay

## 2019-12-06 ENCOUNTER — Encounter: Payer: Self-pay | Admitting: Gastroenterology

## 2019-12-06 ENCOUNTER — Encounter: Payer: Self-pay | Admitting: Cardiology

## 2019-12-06 ENCOUNTER — Ambulatory Visit (INDEPENDENT_AMBULATORY_CARE_PROVIDER_SITE_OTHER): Payer: Medicare Other | Admitting: Gastroenterology

## 2019-12-06 VITALS — BP 150/100 | HR 138 | Ht 65.0 in | Wt 163.6 lb

## 2019-12-06 VITALS — BP 122/68 | HR 64 | Ht 65.0 in | Wt 163.2 lb

## 2019-12-06 DIAGNOSIS — Z01812 Encounter for preprocedural laboratory examination: Secondary | ICD-10-CM | POA: Diagnosis not present

## 2019-12-06 DIAGNOSIS — E611 Iron deficiency: Secondary | ICD-10-CM | POA: Diagnosis not present

## 2019-12-06 DIAGNOSIS — I4891 Unspecified atrial fibrillation: Secondary | ICD-10-CM | POA: Diagnosis not present

## 2019-12-06 DIAGNOSIS — R0609 Other forms of dyspnea: Secondary | ICD-10-CM

## 2019-12-06 DIAGNOSIS — R0600 Dyspnea, unspecified: Secondary | ICD-10-CM | POA: Diagnosis not present

## 2019-12-06 DIAGNOSIS — K257 Chronic gastric ulcer without hemorrhage or perforation: Secondary | ICD-10-CM | POA: Diagnosis not present

## 2019-12-06 DIAGNOSIS — I2510 Atherosclerotic heart disease of native coronary artery without angina pectoris: Secondary | ICD-10-CM

## 2019-12-06 DIAGNOSIS — K279 Peptic ulcer, site unspecified, unspecified as acute or chronic, without hemorrhage or perforation: Secondary | ICD-10-CM | POA: Diagnosis not present

## 2019-12-06 DIAGNOSIS — R002 Palpitations: Secondary | ICD-10-CM | POA: Diagnosis not present

## 2019-12-06 MED ORDER — METOPROLOL TARTRATE 25 MG PO TABS
25.0000 mg | ORAL_TABLET | Freq: Two times a day (BID) | ORAL | 6 refills | Status: DC
Start: 2019-12-06 — End: 2019-12-30

## 2019-12-06 MED ORDER — APIXABAN 5 MG PO TABS
5.0000 mg | ORAL_TABLET | Freq: Two times a day (BID) | ORAL | 6 refills | Status: DC
Start: 2019-12-06 — End: 2020-03-06

## 2019-12-06 NOTE — Progress Notes (Signed)
[place] VISIT   Primary Care Provider [name], MD [place] [address] [phone]  Patient Profile: [name] is a 86 y.o. female with a pmh significant for Chronic Gastric Ulceration, prior breast cancer, hypertension, hyperlipidemia, status post AVR (bioprosthetic) with mild valvular leak, osteopenia, anxiety.  The patient presents to the [facility] for an evaluation and management of problem(s) noted below:  Problem List 1. Atrial fibrillation, unspecified type ([place])   2. PUD (peptic ulcer disease)   3. Chronic gastric ulcer without hemorrhage and without perforation   4. Palpitations   5. Dyspnea on exertion     History of Present Illness Please see prior notes for full details of HPI.    Interval History The patient returns for scheduled follow-up.  From a GI perspective she has been doing okay.  She still has discomfort occur in periods of high stress issues from her life/social stressors.  However continues on PPI and is otherwise doing well.  Weight has been stable.  More importantly, she has been experiencing increased dyspnea on exertion.  She is also been having palpitations as well as a racing heart.  No chest pain.  She feels fatigued.  She has planned follow-up with cardiology next week for an echocardiogram follow-up.  GI Review of Systems Positive as above Negative for pyrosis, dysphagia, odynophagia, nausea, vomiting, change in bowel habits (constipation remains stable to slightly improved), melena, hematochezia  Review of Systems General: Denies fevers/chills Cardiovascular: Denies chest pain currently but has had palpitations and fluttering Pulmonary: Denies current shortness of breath in clinic but as noted above has had increasing dyspnea on exertion Gastroenterological: See HPI Genitourinary: Denies darkened urine Hematological: Denies easy bruising Dermatological:  Denies jaundice Psychological: Mood is concerned due to the increasing dyspnea   Medications Current Outpatient Medications  Medication Sig Dispense Refill  . acetaminophen (TYLENOL) 500 MG tablet Take 500-1,000 mg by mouth at bedtime as needed for mild pain or moderate pain.    Marland Kitchen ALPRAZolam (XANAX) 0.5 MG tablet TAKE 1 TABLET ONCE DAILY AS NEEDED FOR AN ANXIETY, DO NOT TAKE AT NIGHT. 90 tablet 0  . aspirin EC 81 MG EC tablet Take 1 tablet (81 mg total) by mouth daily. 90 tablet 3  . Calcium Carb-Cholecalciferol (CALCIUM 600+D3) 600-400 MG-UNIT TABS Take 1 tablet by mouth daily.    . Chloral Hydrate CRYS Take 10 mLs by mouth at bedtime as needed (Call  liquid into [place]). 900 g 1  . Cholecalciferol (VITAMIN D-3) 1000 UNITS CAPS Take 1 capsule by mouth daily.     . clindamycin (CLEOCIN) 300 MG capsule Take 300 mg by mouth as directed. Take 600 mg (2 capsules) by mouth one hour prior to dental procedures     . Coenzyme Q10 (CO Q 10 PO) Take 1 capsule by mouth daily.    Marland Kitchen doxycycline (VIBRA-TABS) 100 MG tablet Take 1 tablet (100 mg total) by mouth 2 (two) times daily. 20 tablet 0  . ferrous gluconate (FERGON) 324 MG tablet Take 324 mg by mouth 3 (three) times a week. M,W,F    . fluticasone (FLONASE) 50 MCG/ACT nasal spray Place 2 sprays into both nostrils daily. 16 g 6  . HYDROcodone-homatropine (HYCODAN) 5-1.5 MG/5ML syrup TAKE 5ML BY MOUTH EVERY 8 HOURS AS NEEDED FOR COUGH. 120 mL 0  . levocetirizine (XYZAL) 5 MG tablet Take 5 mg by mouth at bedtime as needed for allergies.     Marland Kitchen  losartan (COZAAR) 25 MG tablet Take 1 tablet (25 mg total) by mouth daily. 90 tablet 3  . Magnesium 400 MG CAPS Take 400 mg by mouth every morning.     . nitroGLYCERIN (NITROSTAT) 0.4 MG SL tablet Place 1 tablet (0.4 mg total) under the tongue every 5 (five) minutes as needed for chest pain (CP or SOB). 25 tablet 3  . Ophthalmic Irrigation Solution (OCUSOFT EYE [place] OP) Apply 1 application to eye  2 (two) times daily as needed (dry eyes). Morning and before bed    . pantoprazole (PROTONIX) 40 MG tablet Take 1 tablet (40 mg total) by mouth 2 (two) times daily. 180 tablet 4  . rosuvastatin (CRESTOR) 20 MG tablet Take 1 tablet (20 mg total) by mouth daily. 90 tablet 3  . tretinoin (RETIN-A) 0.05 % cream Apply 1 application topically 3 (three) times a week.     . Wheat Dextrin (BENEFIBER DRINK MIX PO) Take 5 mLs by mouth every morning. Mix with 1/2 a glass water, and takes with vitamins    . apixaban (ELIQUIS) 5 MG TABS tablet Take 1 tablet (5 mg total) by mouth 2 (two) times daily. 60 tablet 6  . metoprolol tartrate (LOPRESSOR) 25 MG tablet Take 1 tablet (25 mg total) by mouth 2 (two) times daily. 60 tablet 6   No current facility-administered medications for this visit.    Allergies Allergies  Allergen Reactions  . Amoxicillin-Pot Clavulanate Hives and Rash  . Penicillin G Rash  . Penicillins Hives and Rash  . Sulfa Antibiotics Rash  . Sulfamethoxazole Rash  . Sulfonamide Derivatives Hives    Histories Past Medical History:  Diagnosis Date  . Allergy   . Anemia   . Anxiety   . Aortic valve stenosis, severe   . Breast cancer ([place]) 2007   right  . Cataract   . Hiatal hernia   . Hypercholesteremia   . Hypoglycemia   . Osteopenia   . Personal history of radiation therapy   . Tingling   . Ulcer 2012   bleeding gastric Ulcer   Past Surgical History:  Procedure Laterality Date  . AORTIC VALVE REPLACEMENT N/A 11/16/2014   Procedure: AORTIC VALVE REPLACEMENT (AVR);  Surgeon: Gaye Pollack, MD;  Location: [place];  Service: Open Heart Surgery;  Laterality: N/A;  . BIOPSY  12/15/2017   Procedure: BIOPSY;  Surgeon: Rush Landmark Telford Nab., MD;  Location: [facility];  Service: Gastroenterology;;  . BREAST BIOPSY    . BREAST LUMPECTOMY Right    2005?  Marland Kitchen BREAST SURGERY    . CARDIAC CATHETERIZATION N/A 10/18/2014   Procedure: Right/Left Heart Cath and Coronary Angiography;   Surgeon: [name], MD;  Location: [facility];  Service: Cardiovascular;  Laterality: N/A;  . CORONARY ANGIOGRAPHY N/A 02/04/2019   Procedure: CORONARY ANGIOGRAPHY;  Surgeon: Nelva Bush, MD;  Location: [facility];  Service: Cardiovascular;  Laterality: N/A;  . COSMETIC SURGERY     face  . ESOPHAGOGASTRODUODENOSCOPY (EGD) WITH PROPOFOL N/A 12/15/2017   Procedure: ESOPHAGOGASTRODUODENOSCOPY (EGD) WITH PROPOFOL;  Surgeon: Rush Landmark Telford Nab., MD;  Location: [facility];  Service: Gastroenterology;  Laterality: N/A;  . PARATHYROIDECTOMY    . TEE WITHOUT CARDIOVERSION N/A 11/16/2014   Procedure: TRANSESOPHAGEAL ECHOCARDIOGRAM (TEE);  Surgeon: Gaye Pollack, MD;  Location: [place] [facility];  Service: Open Heart Surgery;  Laterality: N/A;  . TONSILLECTOMY     Social History   Socioeconomic History  . Marital status: Unknown    Spouse name:  Not on file  . Number of children: 3  . Years of education: 16  . Highest education level: Bachelor's degree (e.g., BA, AB, BS)  Occupational History  . Occupation: Retired  Tobacco Use  . Smoking status: Former Smoker    Packs/day: 0.50    Years: 15.00    Pack years: 7.50    Types: Cigarettes    Start date: 9    Quit date: 1986    Years since quitting: 35.6  . Smokeless tobacco: Never Used  . Tobacco comment: quit smoking 1980  Vaping Use  . Vaping Use: Never used  Substance and Sexual Activity  . Alcohol use: Yes    Alcohol/week: 7.0 standard drinks    Types: 7 Glasses of wine per week    Comment: wine nightly with dinner  . Drug use: No  . Sexual activity: Not Currently  Other Topics Concern  . Not on file  Social History Narrative   ** Merged History Encounter **       Lives at [facility] with her husband. Right-handed. Caffeine use: 3-4 cups per day (some tea, mixes coffee with decaf).   Social Determinants of Health   Financial Resource Strain:   . Difficulty of Paying Living Expenses:   Food Insecurity:    . Worried About Charity fundraiser in the Last Year:   . Arboriculturist in the Last Year:   Transportation Needs:   . Film/video editor (Medical):   Marland Kitchen Lack of Transportation (Non-Medical):   Physical Activity:   . Days of Exercise per Week:   . Minutes of Exercise per Session:   Stress:   . Feeling of Stress :   Social Connections:   . Frequency of Communication with Friends and Family:   . Frequency of Social Gatherings with Friends and Family:   . Attends Religious Services:   . Active Member of Clubs or Organizations:   . Attends Archivist Meetings:   Marland Kitchen Marital Status:   Intimate Partner Violence:   . Fear of Current or Ex-Partner:   . Emotionally Abused:   Marland Kitchen Physically Abused:   . Sexually Abused:    Family History  Problem Relation Age of Onset  . Hypertension Mother   . Rectal cancer Mother   . Colon cancer Mother   . Kidney disease Father   . Cancer Cousin 63       breast and ovarian   . Esophageal cancer Neg Hx   . Stomach cancer Neg Hx   . Colon polyps Neg Hx    I have reviewed her medical, social, and family history in detail and updated the electronic medical record as necessary.    PHYSICAL EXAMINATION  BP 122/68   Pulse 64   Ht 5\' 5"  (1.651 m)   Wt 163 lb 3.2 oz (74 kg)   BMI 27.16 kg/m  Wt Readings from Last 3 Encounters:  12/06/19 163 lb 9.6 oz (74.2 kg)  12/06/19 163 lb 3.2 oz (74 kg)  10/04/19 160 lb 9.6 oz (72.8 kg)  GEN: NAD, appears stated age, doesn't appear chronically ill PSYCH: Cooperative, without pressured speech EYE: Conjunctivae pink, sclerae anicteric ENT: MMM CV: Tachycardic, irregularly irregular, mild systolic murmur present RESP: CTAB posteriorly, without wheezing GI: NABS, soft, NT/ND, without rebound or guarding, no HSM appreciated MSK/EXT: No significant lower extremity edema SKIN: No jaundice NEURO:  Alert & Oriented x 3, no focal deficits   REVIEW OF DATA  I  reviewed the following data at the time  of this encounter:  GI Procedures and Studies  Previously reviewed  Laboratory Studies  Reviewed in epic  Imaging Studies  Previously reviewed   ASSESSMENT  Ms. Sprowl is a 86 y.o. female with a pmh significant for Chronic Gastric Ulceration, prior breast cancer, hypertension, hyperlipidemia, status post AVR (bioprosthetic) with mild valvular leak, osteopenia, anxiety.  The patient is seen today for evaluation and management of:  1. Atrial fibrillation, unspecified type ([place])   2. PUD (peptic ulcer disease)   3. Chronic gastric ulcer without hemorrhage and without perforation   4. Palpitations   5. Dyspnea on exertion    The patient is doing well clinically from a GI perspective.  She remains on current PPI dosing.  We previously discussed potential repeat endoscopy versus role of endoscopic ultrasound in the future.  As she is doing well she wants to minimize repeat procedures if possible.  So for now we will hold on that but consider in future reevaluate in few months time.  Constipation is stable and she does her fiber supplementation she has been doing slightly better.  From a GI perspective we would see her back in 4 to 6 months and see where things go from there. More importantly, and more concerning, is her increasing dyspnea on exertion and palpitations.  Her physical exam today is most consistent with the patient with new onset atrial fibrillation.  She has a history of prior aortic valve repair with concern for some leakage.  Although there is not significant pulmonary edema on her exam she does require next steps in evaluation.  Discussed the potential of going to the emergency department versus contacting her cardiology team.  We were able to reach out to the cardiology group and they are going to arrange to see her later this afternoon.  We will have a friend or her facility drive her to the clinic visit for further evaluation of her new onset likely atrial fibrillation. All  patient questions were answered to the best of my ability, and the patient agrees to the aforementioned plan of action with follow-up as indicated.   PLAN  Continue PPI twice daily for now but likely in the course of coming months decrease to once daily Bring patient back in 4 to 6 months to discuss endoscopic ultrasound With bowel habits be okay currently she still wants to hold on colonoscopic evaluation for one final surveillance so we can hold on that for now but consider in future Iron studies will need to be rechecked in the future as we have not rechecked him in a year. If she remains iron deficient then we will strongly consider the role of repeat colonoscopy Further evaluation cardiology office for likely new onset atrial fibrillation   No orders of the defined types were placed in this encounter.   New Prescriptions   APIXABAN (ELIQUIS) 5 MG TABS TABLET    Take 1 tablet (5 mg total) by mouth 2 (two) times daily.   METOPROLOL TARTRATE (LOPRESSOR) 25 MG TABLET    Take 1 tablet (25 mg total) by mouth 2 (two) times daily.   Modified Medications   No medications on file    Planned Follow Up: No follow-ups on file.   Total Time in Face-to-Face and in Coordination of Care for patient including independent/personal interpretation/review of prior testing, medical history, examination, medication adjustment, communicating results with the patient directly, and documentation with the EHR is greater than 45 minutes.  This also included the new diagnosis of likely atrial fibrillation, the coordination of care to be either in the emergency room versus cardiology and further discussion with cardiology subspecialists in order to optimize care for patient and to be seen in follow-up later today.   Justice Britain, MD [facility]oscopy Office # [phone]

## 2019-12-06 NOTE — Patient Instructions (Addendum)
GI symptoms are stable.   Continue Pantoprazole.  Follow-up with [place] as needed.   If you are age 84 or older, your body mass index should be between 23-30. Your Body mass index is 27.16 kg/m. If this is out of the aforementioned range listed, please consider follow up with your Primary Care Provider.  If you are age 22 or younger, your body mass index should be between 19-25. Your Body mass index is 27.16 kg/m. If this is out of the aformentioned range listed, please consider follow up with your Primary Care Provider.    Thank you for choosing me and [facility].  Dr. Rush Landmark

## 2019-12-06 NOTE — Patient Instructions (Addendum)
Medication Instructions:  Please start Eliquis 5 mg one tablet twice a day.  Start Metoprolol Tartrate 25 mg one tablet twice a day.  Continue all other medications as listed.  *If you need a refill on your cardiac medications before your next appointment, please call your pharmacy*  Lab Work: Please have blood work today (CBC, BMP, TSH, Free T4)  If you have labs (blood work) drawn today and your tests are completely normal, you will receive your results only by:  [place] (if you have [place]) OR  A paper copy in the mail If you have any lab test that is abnormal or we need to change your treatment, we will call you to review the results.  Due to recent COVID-19 restrictions implemented by our local and state authorities and in an effort to keep both patients and staff as safe as possible, our hospital system requires COVID-19 testing prior to certain scheduled hospital procedures.  Please go to [facility] [place], [place] [address] on August 11 at  1:15 pm  .  This is a drive up testing site.  You will not need to exit your vehicle.  You will not be billed at the time of testing but may receive a bill later depending on your insurance.  The approximate cost of the test is $100.  You must agree to self-quarantine from the time of your testing until the procedure date on Friday December 09, 2019.  This should included staying home with ONLY the people you live with.  Avoid take-out, grocery store shopping or leaving the house for any non-emergent reason.  Failure to have your COVID-19 test done on the date and time you have been scheduled will result in cancellation of your procedure.  Please call our office at [phone] if you have any questions.  Testing/Procedures: Your physician has requested that you have a TEE/Cardioversion. During a TEE, sound waves are used to create images of your heart. It provides your doctor with information about the size and shape of your heart and how  well your hearts chambers and valves are working. In this test, a transducer is attached to the end of a flexible tube that is guided down you throat and into your esophagus (the tube leading from your mouth to your stomach) to get a more detailed image of your heart. Once the TEE has determined that a blood clot is not present, the cardioversion begins. Electrical Cardioversion uses a jolt of electricity to your heart either through paddles or wired patches attached to your chest. This is a controlled, usually prescheduled, procedure. This procedure is done at the hospital and you are not awake during the procedure. You usually go home the day of the procedure.   Follow-Up: At [facility], you and your health needs are our priority.  As part of our continuing mission to provide you with exceptional heart care, we have created designated Provider Care Teams.  These Care Teams include your primary Cardiologist (physician) and Advanced Practice Providers (APPs -  Physician Assistants and Nurse Practitioners) who all work together to provide you with the care you need, when you need it.  We recommend signing up for the patient portal called "MyChart".  Sign up information is provided on this After Visit Summary.  MyChart is used to connect with patients for Virtual Visits (Telemedicine).  Patients are able to view lab/test results, encounter notes, upcoming appointments, etc.  Non-urgent messages can be sent to your provider as well.  To learn more about what you can do with MyChart, go to [email or web address].    Your next appointment:   2-4 week(s)  The format for your next appointment:   In Person  Provider:   You may see Dr Burt Knack or one of the following Advanced Practice Providers on your designated Care Team:    Richardson Dopp, PA-C  Robbie Lis, [place]   Thank you for choosing [facility]!!      Seth Ward OFFICE [place], [address] [place] [address] Dept: [phone] Loc: [place]  12/06/2019  You are scheduled for a TEE/Cardioversion on Friday August 13 th  with Dr. Johnsie Cancel .  1. Please arrive at the [facility] (Main Entrance A) at [facility]: [address] [place], [place] [address] at  8 AM. Free valet parking service is available.   Special note: Every effort is made to have your procedure done on time. Please understand that emergencies sometimes delay scheduled procedures.  2. Diet: Nothing to eat/drink after midnight.  3. Labs: Please have today   Covid screen as scheduled above.  4. Medication instructions in preparation for your procedure:  On the morning of your procedure, take your ELIQUIS and any morning medicines NOT listed above.  You may use sips of water.  5. Bring a current list of your medications and current insurance cards. 6. You MUST have a responsible person to drive you home. 7. Someone MUST be with you the first 24 hours after you arrive home or your discharge will be delayed. 8. Please wear clothes that are easy to get on and off and wear slip-on shoes.  Thank you for allowing [place] to care for you!   -- [place] [facility]

## 2019-12-06 NOTE — Progress Notes (Signed)
Cardiology Office Note:    Date:  12/06/2019   ID:  [name], DOB 1932/09/28, MRN [id number]  PCP:  [name], MD  [facility] Cardiologist:  Candee Furbish, MD  [facility] Electrophysiologist:  None   Referring MD: [name], MD     History of Present Illness:    [name] is a 86 y.o. female here for the evaluation of newly discovered atrial fibrillation with rapid ventricular response.  She was at GI doctor earlier today, heart rate was tachycardic and irregular.  Was found to be in atrial fibrillation.  EKG today shows atrial fibrillation rate 138 bpm.  Never had AFIB before.  No stroke Quit tob at 14, 10 years total.  No DM Mild HTN CAD  Bleeding ulcer 2012, 2019. Almost fainted. Did not need blood transufusion.  She has been feeling shortness of breath and in fact had an echocardiogram scheduled for next week given her prior aortic valve replacement. Went to [employer] last WED. Felt more winded. Harder to walk. Not sleeping as well. Thighs hurt.   Lives at [place]  She had bicuspid aortic valve stenosis and underwent bioprosthetic aortic valve replacement in 2016.  Normal coronary arteries at the time of surgery.  In October 2020 she presented with a non-STEMI and had a cath which showed total occlusion of distal LAD potentially embolic.  EF was 45-50.  Has a mild to moderate perivalvular leak since surgery.  Has been on aspirin.  Past Medical History:  Diagnosis Date   Allergy    Anemia    Anxiety    Aortic valve stenosis, severe    Breast cancer ([place]) 2007   right   Cataract    Hiatal hernia    Hypercholesteremia    Hypoglycemia    Osteopenia    Personal history of radiation therapy    Tingling    Ulcer 2012   bleeding gastric Ulcer    Past Surgical History:  Procedure Laterality Date   AORTIC VALVE REPLACEMENT N/A 11/16/2014   Procedure: AORTIC VALVE REPLACEMENT (AVR);  Surgeon: Gaye Pollack, MD;   Location: [facility];  Service: Open Heart Surgery;  Laterality: N/A;   BIOPSY  12/15/2017   Procedure: BIOPSY;  Surgeon: Rush Landmark Telford Nab., MD;  Location: [place];  Service: Gastroenterology;;   BREAST BIOPSY     BREAST LUMPECTOMY Right    2005?   BREAST SURGERY     CARDIAC CATHETERIZATION N/A 10/18/2014   Procedure: Right/Left Heart Cath and Coronary Angiography;  Surgeon: [name], MD;  Location: [place] CV LAB;  Service: Cardiovascular;  Laterality: N/A;   CORONARY ANGIOGRAPHY N/A 02/04/2019   Procedure: CORONARY ANGIOGRAPHY;  Surgeon: Nelva Bush, MD;  Location: [place] CV LAB;  Service: Cardiovascular;  Laterality: N/A;   COSMETIC SURGERY     face   ESOPHAGOGASTRODUODENOSCOPY (EGD) WITH PROPOFOL N/A 12/15/2017   Procedure: ESOPHAGOGASTRODUODENOSCOPY (EGD) WITH PROPOFOL;  Surgeon: Rush Landmark Telford Nab., MD;  Location: [place];  Service: Gastroenterology;  Laterality: N/A;   PARATHYROIDECTOMY     TEE WITHOUT CARDIOVERSION N/A 11/16/2014   Procedure: TRANSESOPHAGEAL ECHOCARDIOGRAM (TEE);  Surgeon: Gaye Pollack, MD;  Location: [place];  Service: Open Heart Surgery;  Laterality: N/A;   TONSILLECTOMY      Current Medications: Current Meds  Medication Sig   acetaminophen (TYLENOL) 500 MG tablet Take 500-1,000 mg by mouth at bedtime as needed for mild pain or moderate pain.   ALPRAZolam (XANAX) 0.5 MG tablet TAKE  1 TABLET ONCE DAILY AS NEEDED FOR AN ANXIETY, DO NOT TAKE AT NIGHT.   aspirin EC 81 MG EC tablet Take 1 tablet (81 mg total) by mouth daily.   Calcium Carb-Cholecalciferol (CALCIUM 600+D3) 600-400 MG-UNIT TABS Take 1 tablet by mouth daily.   Chloral Hydrate CRYS Take 10 mLs by mouth at bedtime as needed (Call  liquid into [facility]).   Cholecalciferol (VITAMIN D-3) 1000 UNITS CAPS Take 1 capsule by mouth daily.    clindamycin (CLEOCIN) 300 MG capsule Take 300 mg by mouth as directed. Take 600 mg (2 capsules) by mouth  one hour prior to dental procedures    Coenzyme Q10 (CO Q 10 PO) Take 1 capsule by mouth daily.   doxycycline (VIBRA-TABS) 100 MG tablet Take 1 tablet (100 mg total) by mouth 2 (two) times daily.   ferrous gluconate (FERGON) 324 MG tablet Take 324 mg by mouth 3 (three) times a week. M,W,F   fluticasone (FLONASE) 50 MCG/ACT nasal spray Place 2 sprays into both nostrils daily.   HYDROcodone-homatropine (HYCODAN) 5-1.5 MG/5ML syrup TAKE 5ML BY MOUTH EVERY 8 HOURS AS NEEDED FOR COUGH.   levocetirizine (XYZAL) 5 MG tablet Take 5 mg by mouth at bedtime as needed for allergies.    losartan (COZAAR) 25 MG tablet Take 1 tablet (25 mg total) by mouth daily.   Magnesium 400 MG CAPS Take 400 mg by mouth every morning.    nitroGLYCERIN (NITROSTAT) 0.4 MG SL tablet Place 1 tablet (0.4 mg total) under the tongue every 5 (five) minutes as needed for chest pain (CP or SOB).   Ophthalmic Irrigation Solution (OCUSOFT EYE [place] OP) Apply 1 application to eye 2 (two) times daily as needed (dry eyes). Morning and before bed   pantoprazole (PROTONIX) 40 MG tablet Take 1 tablet (40 mg total) by mouth 2 (two) times daily.   rosuvastatin (CRESTOR) 20 MG tablet Take 1 tablet (20 mg total) by mouth daily.   tretinoin (RETIN-A) 0.05 % cream Apply 1 application topically 3 (three) times a week.    Wheat Dextrin (BENEFIBER DRINK MIX PO) Take 5 mLs by mouth every morning. Mix with 1/2 a glass water, and takes with vitamins     Allergies:   Amoxicillin-pot clavulanate, Penicillin g, Penicillins, Sulfa antibiotics, Sulfamethoxazole, and Sulfonamide derivatives   Social History   Socioeconomic History   Marital status: Unknown    Spouse name: Not on file   Number of children: 3   Years of education: 16   Highest education level: Bachelor's degree (e.g., BA, AB, BS)  Occupational History   Occupation: Retired  Tobacco Use   Smoking status: Former Smoker    Packs/day: 0.50    Years: 15.00    Pack  years: 7.50    Types: Cigarettes    Start date: 1971    Quit date: 1986    Years since quitting: 35.6   Smokeless tobacco: Never Used   Tobacco comment: quit smoking 1980  Vaping Use   Vaping Use: Never used  Substance and Sexual Activity   Alcohol use: Yes    Alcohol/week: 7.0 standard drinks    Types: 7 Glasses of wine per week    Comment: wine nightly with dinner   Drug use: No   Sexual activity: Not Currently  Other Topics Concern   Not on file  Social History Narrative   ** Merged History Encounter **       Lives at [facility] with her husband. Right-handed. Caffeine  use: 3-4 cups per day (some tea, mixes coffee with decaf).   Social Determinants of Health   Financial Resource Strain:    Difficulty of Paying Living Expenses:   Food Insecurity:    Worried About Charity fundraiser in the Last Year:    Arboriculturist in the Last Year:   Transportation Needs:    Film/video editor (Medical):    Lack of Transportation (Non-Medical):   Physical Activity:    Days of Exercise per Week:    Minutes of Exercise per Session:   Stress:    Feeling of Stress :   Social Connections:    Frequency of Communication with Friends and Family:    Frequency of Social Gatherings with Friends and Family:    Attends Religious Services:    Active Member of Clubs or Organizations:    Attends Music therapist:    Marital Status:      Family History: The patient's family history includes Cancer (age of onset: 46) in her cousin; Colon cancer in her mother; Hypertension in her mother; Kidney disease in her father; Rectal cancer in her mother. There is no history of Esophageal cancer, Stomach cancer, or Colon polyps.  ROS:   Please see the history of present illness.     All other systems reviewed and are negative.  EKGs/Labs/Other Studies Reviewed:    The following studies were reviewed today:  Echo 02-04-2019: IMPRESSIONS    1. Left  ventricular ejection fraction, by visual estimation, is 45 to  50%. The left ventricle has mildly decreased function. Normal left  ventricular size. There is mildly increased left ventricular hypertrophy.  2. Apical septal segment and apex are abnormal.  3. Left ventricular diastolic Doppler parameters are consistent with  pseudonormalization pattern of LV diastolic filling.  4. Global right ventricle has normal systolic function.The right  ventricular size is normal. No increase in right ventricular wall  thickness.  5. Left atrial size was mildly dilated.  6. Right atrial size was normal.  7. The mitral valve is normal in structure. Mild mitral valve  regurgitation. No evidence of mitral stenosis.  8. The tricuspid valve is normal in structure. Tricuspid valve  regurgitation severe.  9. Aortic valve regurgitation is moderate by color flow Doppler. Mild  aortic valve sclerosis without stenosis.  10. Peak transaortic valve velocity 2.76m/s, mean gradient 89mmHg.  11. The pulmonic valve was normal in structure. Pulmonic valve  regurgitation is not visualized by color flow Doppler.  12. Moderately elevated pulmonary artery systolic pressure.  13. The inferior vena cava is normal in size with greater than 50%  respiratory variability, suggesting right atrial pressure of 3 mmHg.  14. Aortic Valve: A 76mm Magna bioprosthetic aortic valve   EKG:  EKG is  ordered today.  The ekg ordered today demonstrates atrial fibrillation 138  Recent Labs: 06/20/2019: ALT 17; BUN 14; Creatinine, Ser 0.82; Hemoglobin 13.6; Platelets 184.0; Potassium 3.7; Sodium 136; TSH 1.82  Recent Lipid Panel    Component Value Date/Time   CHOL 230 (H) 06/20/2019 0929   TRIG 73.0 06/20/2019 0929   HDL 94.90 06/20/2019 0929   CHOLHDL 2 06/20/2019 0929   VLDL 14.6 06/20/2019 0929   LDLCALC 120 (H) 06/20/2019 0929   LDLDIRECT 144.0 11/29/2012 1601    Physical Exam:    VS:  BP (!) 150/100    Pulse (!) 138     Ht 5\' 5"  (1.651 m)    Wt 163 lb 9.6  oz (74.2 kg)    SpO2 96%    BMI 27.22 kg/m     Wt Readings from Last 3 Encounters:  12/06/19 163 lb 9.6 oz (74.2 kg)  12/06/19 163 lb 3.2 oz (74 kg)  10/04/19 160 lb 9.6 oz (72.8 kg)     GEN:  Well nourished, well developed in no acute distress HEENT: Normal NECK: No JVD; No carotid bruits LYMPHATICS: No lymphadenopathy CARDIAC: Irregularly irregular tachycardic, no murmurs, rubs, gallops RESPIRATORY:  Clear to auscultation without rales, wheezing or rhonchi  ABDOMEN: Soft, non-tender, non-distended MUSCULOSKELETAL:  No edema; No deformity  SKIN: Warm and dry NEUROLOGIC:  Alert and oriented x 3 PSYCHIATRIC:  Normal affect   ASSESSMENT:    1. New onset atrial fibrillation ([place])   2. Pre-procedure lab exam    PLAN:    In order of problems listed above:  New onset atrial fibrillation paroxysmal-CHA2DS2-VASc score of 4, age greater than 36, hypertension, CAD -Heart rate 138, has been symptomatic for over a week.  Short of breath. -Metoprolol 25 mg twice a day started. -Eliquis 5 mg twice a day started -Checking CBC and basic metabolic profile as well as TSH and free T4 -Given her current heart rate, persistent symptoms for over a week, I think it makes sense for [place] to proceed with TEE cardioversion.  We will set up for Friday.  Risks and benefits have been explained she is willing to proceed.  I did not want a wait for 3 weeks before cardioversion.  Aortic valve replacement -Bioprosthetic, mild to moderate perivalvular leak previously noted.  TEE prior to cardioversion upcoming. -I will continue with her aspirin 81 mg given her valve.  Obviously if there is any evidence of bleeding especially given her prior peptic ulcer disease which is well controlled by Dr. Rush Landmark, we may need to proceed with Eliquis monotherapy.   Medication Adjustments/Labs and Tests Ordered: Current medicines are reviewed at length with the patient today.  Concerns  regarding medicines are outlined above.  Orders Placed This Encounter  Procedures   CBC   Basic metabolic panel   TSH   T4, free   EKG 12-Lead   Meds ordered this encounter  Medications   metoprolol tartrate (LOPRESSOR) 25 MG tablet    Sig: Take 1 tablet (25 mg total) by mouth 2 (two) times daily.    Dispense:  60 tablet    Refill:  6   apixaban (ELIQUIS) 5 MG TABS tablet    Sig: Take 1 tablet (5 mg total) by mouth 2 (two) times daily.    Dispense:  60 tablet    Refill:  6    Patient Instructions  Medication Instructions:  Please start Eliquis 5 mg one tablet twice a day.  Start Metoprolol Tartrate 25 mg one tablet twice a day.  Continue all other medications as listed.  *If you need a refill on your cardiac medications before your next appointment, please call your pharmacy*  Lab Work: Please have blood work today (CBC, BMP, TSH, Free T4)  If you have labs (blood work) drawn today and your tests are completely normal, you will receive your results only by:  [place] (if you have [place]) OR  A paper copy in the mail If you have any lab test that is abnormal or we need to change your treatment, we will call you to review the results.  Due to recent COVID-19 restrictions implemented by our local and state authorities and in an effort to keep  both patients and staff as safe as possible, our hospital system requires COVID-19 testing prior to certain scheduled hospital procedures.  Please go to [place]. [place], [place] [address] on August 11 at  1:15 pm  .  This is a drive up testing site.  You will not need to exit your vehicle.  You will not be billed at the time of testing but may receive a bill later depending on your insurance.  The approximate cost of the test is $100.  You must agree to self-quarantine from the time of your testing until the procedure date on Friday December 09, 2019.  This should included staying home with ONLY the people you live with.   Avoid take-out, grocery store shopping or leaving the house for any non-emergent reason.  Failure to have your COVID-19 test done on the date and time you have been scheduled will result in cancellation of your procedure.  Please call our office at [phone] if you have any questions.  Testing/Procedures: Your physician has requested that you have a TEE/Cardioversion. During a TEE, sound waves are used to create images of your heart. It provides your doctor with information about the size and shape of your heart and how well your hearts chambers and valves are working. In this test, a transducer is attached to the end of a flexible tube that is guided down you throat and into your esophagus (the tube leading from your mouth to your stomach) to get a more detailed image of your heart. Once the TEE has determined that a blood clot is not present, the cardioversion begins. Electrical Cardioversion uses a jolt of electricity to your heart either through paddles or wired patches attached to your chest. This is a controlled, usually prescheduled, procedure. This procedure is done at the hospital and you are not awake during the procedure. You usually go home the day of the procedure.   Follow-Up: At [facility], you and your health needs are our priority.  As part of our continuing mission to provide you with exceptional heart care, we have created designated Provider Care Teams.  These Care Teams include your primary Cardiologist (physician) and Advanced Practice Providers (APPs -  Physician Assistants and Nurse Practitioners) who all work together to provide you with the care you need, when you need it.  We recommend signing up for the patient portal called "MyChart".  Sign up information is provided on this After Visit Summary.  MyChart is used to connect with patients for Virtual Visits (Telemedicine).  Patients are able to view lab/test results, encounter notes, upcoming appointments, etc.  Non-urgent  messages can be sent to your provider as well.   To learn more about what you can do with MyChart, go to [email or web address].    Your next appointment:   2-4 week(s)  The format for your next appointment:   In Person  Provider:   You may see Dr Burt Knack or one of the following Advanced Practice Providers on your designated Care Team:    Richardson Dopp, PA-C  Robbie Lis, [place]   Thank you for choosing [facility]!!      [place] OFFICE [place], [address] [place] [address] Dept: [phone] Loc: [place]  12/06/2019  You are scheduled for a TEE/Cardioversion on Friday August 13 th  with Dr. Johnsie Cancel .  1. Please arrive at the [facility] (Main Entrance A)  at [facility]: [address] [place], [place] [address] at  [place] parking service is available.   Special note: Every effort is made to have your procedure done on time. Please understand that emergencies sometimes delay scheduled procedures.  2. Diet: Nothing to eat/drink after midnight.  3. Labs: Please have today   Covid screen as scheduled above.  4. Medication instructions in preparation for your procedure:  On the morning of your procedure, take your ELIQUIS and any morning medicines NOT listed above.  You may use sips of water.  5. Bring a current list of your medications and current insurance cards. 6. You MUST have a responsible person to drive you home. 7. Someone MUST be with you the first 24 hours after you arrive home or your discharge will be delayed. 8. Please wear clothes that are easy to get on and off and wear slip-on shoes.  Thank you for allowing [place] to care for you!   -- [facility]     Signed, Candee Furbish, MD  12/06/2019 1:21 PM    [place]

## 2019-12-06 NOTE — H&P (View-Only) (Signed)
Cardiology Office Note:    Date:  12/06/2019   ID:  [name], DOB 1933-03-15, MRN [id number]  PCP:  [name], MD  [facility] Cardiologist:  Candee Furbish, MD  [facility] Electrophysiologist:  None   Referring MD: [name], MD     History of Present Illness:    [name] is a 86 y.o. female here for the evaluation of newly discovered atrial fibrillation with rapid ventricular response.  She was at GI doctor earlier today, heart rate was tachycardic and irregular.  Was found to be in atrial fibrillation.  EKG today shows atrial fibrillation rate 138 bpm.  Never had AFIB before.  No stroke Quit tob at 79, 10 years total.  No DM Mild HTN CAD  Bleeding ulcer 2012, 2019. Almost fainted. Did not need blood transufusion.  She has been feeling shortness of breath and in fact had an echocardiogram scheduled for next week given her prior aortic valve replacement. Went to [employer] last WED. Felt more winded. Harder to walk. Not sleeping as well. Thighs hurt.   Lives at [place]  She had bicuspid aortic valve stenosis and underwent bioprosthetic aortic valve replacement in 2016.  Normal coronary arteries at the time of surgery.  In October 2020 she presented with a non-STEMI and had a cath which showed total occlusion of distal LAD potentially embolic.  EF was 45-50.  Has a mild to moderate perivalvular leak since surgery.  Has been on aspirin.  Past Medical History:  Diagnosis Date  . Allergy   . Anemia   . Anxiety   . Aortic valve stenosis, severe   . Breast cancer ([place]) 2007   right  . Cataract   . Hiatal hernia   . Hypercholesteremia   . Hypoglycemia   . Osteopenia   . Personal history of radiation therapy   . Tingling   . Ulcer 2012   bleeding gastric Ulcer    Past Surgical History:  Procedure Laterality Date  . AORTIC VALVE REPLACEMENT N/A 11/16/2014   Procedure: AORTIC VALVE REPLACEMENT (AVR);  Surgeon: Gaye Pollack, MD;   Location: [place];  Service: Open Heart Surgery;  Laterality: N/A;  . BIOPSY  12/15/2017   Procedure: BIOPSY;  Surgeon: Rush Landmark Telford Nab., MD;  Location: [place];  Service: Gastroenterology;;  . BREAST BIOPSY    . BREAST LUMPECTOMY Right    2005?  Marland Kitchen BREAST SURGERY    . CARDIAC CATHETERIZATION N/A 10/18/2014   Procedure: Right/Left Heart Cath and Coronary Angiography;  Surgeon: [name], MD;  Location: [place] CV LAB;  Service: Cardiovascular;  Laterality: N/A;  . CORONARY ANGIOGRAPHY N/A 02/04/2019   Procedure: CORONARY ANGIOGRAPHY;  Surgeon: Nelva Bush, MD;  Location: [place] CV LAB;  Service: Cardiovascular;  Laterality: N/A;  . COSMETIC SURGERY     face  . ESOPHAGOGASTRODUODENOSCOPY (EGD) WITH PROPOFOL N/A 12/15/2017   Procedure: ESOPHAGOGASTRODUODENOSCOPY (EGD) WITH PROPOFOL;  Surgeon: Rush Landmark Telford Nab., MD;  Location: [place];  Service: Gastroenterology;  Laterality: N/A;  . PARATHYROIDECTOMY    . TEE WITHOUT CARDIOVERSION N/A 11/16/2014   Procedure: TRANSESOPHAGEAL ECHOCARDIOGRAM (TEE);  Surgeon: Gaye Pollack, MD;  Location: [place];  Service: Open Heart Surgery;  Laterality: N/A;  . TONSILLECTOMY      Current Medications: Current Meds  Medication Sig  . acetaminophen (TYLENOL) 500 MG tablet Take 500-1,000 mg by mouth at bedtime as needed for mild pain or moderate pain.  Marland Kitchen ALPRAZolam (XANAX) 0.5 MG tablet TAKE  1 TABLET ONCE DAILY AS NEEDED FOR AN ANXIETY, DO NOT TAKE AT NIGHT.  Marland Kitchen aspirin EC 81 MG EC tablet Take 1 tablet (81 mg total) by mouth daily.  . Calcium Carb-Cholecalciferol (CALCIUM 600+D3) 600-400 MG-UNIT TABS Take 1 tablet by mouth daily.  . Chloral Hydrate CRYS Take 10 mLs by mouth at bedtime as needed (Call  liquid into [place]).  . Cholecalciferol (VITAMIN D-3) 1000 UNITS CAPS Take 1 capsule by mouth daily.   . clindamycin (CLEOCIN) 300 MG capsule Take 300 mg by mouth as directed. Take 600 mg (2 capsules) by mouth  one hour prior to dental procedures   . Coenzyme Q10 (CO Q 10 PO) Take 1 capsule by mouth daily.  Marland Kitchen doxycycline (VIBRA-TABS) 100 MG tablet Take 1 tablet (100 mg total) by mouth 2 (two) times daily.  . ferrous gluconate (FERGON) 324 MG tablet Take 324 mg by mouth 3 (three) times a week. M,W,F  . fluticasone (FLONASE) 50 MCG/ACT nasal spray Place 2 sprays into both nostrils daily.  Marland Kitchen HYDROcodone-homatropine (HYCODAN) 5-1.5 MG/5ML syrup TAKE 5ML BY MOUTH EVERY 8 HOURS AS NEEDED FOR COUGH.  . levocetirizine (XYZAL) 5 MG tablet Take 5 mg by mouth at bedtime as needed for allergies.   Marland Kitchen losartan (COZAAR) 25 MG tablet Take 1 tablet (25 mg total) by mouth daily.  . Magnesium 400 MG CAPS Take 400 mg by mouth every morning.   . nitroGLYCERIN (NITROSTAT) 0.4 MG SL tablet Place 1 tablet (0.4 mg total) under the tongue every 5 (five) minutes as needed for chest pain (CP or SOB).  Marland Kitchen Ophthalmic Irrigation Solution (OCUSOFT EYE [place] OP) Apply 1 application to eye 2 (two) times daily as needed (dry eyes). Morning and before bed  . pantoprazole (PROTONIX) 40 MG tablet Take 1 tablet (40 mg total) by mouth 2 (two) times daily.  . rosuvastatin (CRESTOR) 20 MG tablet Take 1 tablet (20 mg total) by mouth daily.  Marland Kitchen tretinoin (RETIN-A) 0.05 % cream Apply 1 application topically 3 (three) times a week.   . Wheat Dextrin (BENEFIBER DRINK MIX PO) Take 5 mLs by mouth every morning. Mix with 1/2 a glass water, and takes with vitamins     Allergies:   Amoxicillin-pot clavulanate, Penicillin g, Penicillins, Sulfa antibiotics, Sulfamethoxazole, and Sulfonamide derivatives   Social History   Socioeconomic History  . Marital status: Unknown    Spouse name: Not on file  . Number of children: 3  . Years of education: 16  . Highest education level: Bachelor's degree (e.g., BA, AB, BS)  Occupational History  . Occupation: Retired  Tobacco Use  . Smoking status: Former Smoker    Packs/day: 0.50    Years: 15.00    Pack  years: 7.50    Types: Cigarettes    Start date: 40    Quit date: 1986    Years since quitting: 35.6  . Smokeless tobacco: Never Used  . Tobacco comment: quit smoking 1980  Vaping Use  . Vaping Use: Never used  Substance and Sexual Activity  . Alcohol use: Yes    Alcohol/week: 7.0 standard drinks    Types: 7 Glasses of wine per week    Comment: wine nightly with dinner  . Drug use: No  . Sexual activity: Not Currently  Other Topics Concern  . Not on file  Social History Narrative   ** Merged History Encounter **       Lives at [facility] with her husband. Right-handed. Caffeine  use: 3-4 cups per day (some tea, mixes coffee with decaf).   Social Determinants of Health   Financial Resource Strain:   . Difficulty of Paying Living Expenses:   Food Insecurity:   . Worried About Charity fundraiser in the Last Year:   . Arboriculturist in the Last Year:   Transportation Needs:   . Film/video editor (Medical):   Marland Kitchen Lack of Transportation (Non-Medical):   Physical Activity:   . Days of Exercise per Week:   . Minutes of Exercise per Session:   Stress:   . Feeling of Stress :   Social Connections:   . Frequency of Communication with Friends and Family:   . Frequency of Social Gatherings with Friends and Family:   . Attends Religious Services:   . Active Member of Clubs or Organizations:   . Attends Archivist Meetings:   Marland Kitchen Marital Status:      Family History: The patient's family history includes Cancer (age of onset: 16) in her cousin; Colon cancer in her mother; Hypertension in her mother; Kidney disease in her father; Rectal cancer in her mother. There is no history of Esophageal cancer, Stomach cancer, or Colon polyps.  ROS:   Please see the history of present illness.     All other systems reviewed and are negative.  EKGs/Labs/Other Studies Reviewed:    The following studies were reviewed today:  Echo 02-04-2019: IMPRESSIONS    1. Left  ventricular ejection fraction, by visual estimation, is 45 to  50%. The left ventricle has mildly decreased function. Normal left  ventricular size. There is mildly increased left ventricular hypertrophy.  2. Apical septal segment and apex are abnormal.  3. Left ventricular diastolic Doppler parameters are consistent with  pseudonormalization pattern of LV diastolic filling.  4. Global right ventricle has normal systolic function.The right  ventricular size is normal. No increase in right ventricular wall  thickness.  5. Left atrial size was mildly dilated.  6. Right atrial size was normal.  7. The mitral valve is normal in structure. Mild mitral valve  regurgitation. No evidence of mitral stenosis.  8. The tricuspid valve is normal in structure. Tricuspid valve  regurgitation severe.  9. Aortic valve regurgitation is moderate by color flow Doppler. Mild  aortic valve sclerosis without stenosis.  10. Peak transaortic valve velocity 2.81m/s, mean gradient 52mmHg.  11. The pulmonic valve was normal in structure. Pulmonic valve  regurgitation is not visualized by color flow Doppler.  12. Moderately elevated pulmonary artery systolic pressure.  13. The inferior vena cava is normal in size with greater than 50%  respiratory variability, suggesting right atrial pressure of 3 mmHg.  14. Aortic Valve: A 45mm Magna bioprosthetic aortic valve   EKG:  EKG is  ordered today.  The ekg ordered today demonstrates atrial fibrillation 138  Recent Labs: 06/20/2019: ALT 17; BUN 14; Creatinine, Ser 0.82; Hemoglobin 13.6; Platelets 184.0; Potassium 3.7; Sodium 136; TSH 1.82  Recent Lipid Panel    Component Value Date/Time   CHOL 230 (H) 06/20/2019 0929   TRIG 73.0 06/20/2019 0929   HDL 94.90 06/20/2019 0929   CHOLHDL 2 06/20/2019 0929   VLDL 14.6 06/20/2019 0929   LDLCALC 120 (H) 06/20/2019 0929   LDLDIRECT 144.0 11/29/2012 1601    Physical Exam:    VS:  BP (!) 150/100   Pulse (!) 138    Ht 5\' 5"  (1.651 m)   Wt 163 lb 9.6 oz (74.2 kg)  SpO2 96%   BMI 27.22 kg/m     Wt Readings from Last 3 Encounters:  12/06/19 163 lb 9.6 oz (74.2 kg)  12/06/19 163 lb 3.2 oz (74 kg)  10/04/19 160 lb 9.6 oz (72.8 kg)     GEN:  Well nourished, well developed in no acute distress HEENT: Normal NECK: No JVD; No carotid bruits LYMPHATICS: No lymphadenopathy CARDIAC: Irregularly irregular tachycardic, no murmurs, rubs, gallops RESPIRATORY:  Clear to auscultation without rales, wheezing or rhonchi  ABDOMEN: Soft, non-tender, non-distended MUSCULOSKELETAL:  No edema; No deformity  SKIN: Warm and dry NEUROLOGIC:  Alert and oriented x 3 PSYCHIATRIC:  Normal affect   ASSESSMENT:    1. New onset atrial fibrillation ([place])   2. Pre-procedure lab exam    PLAN:    In order of problems listed above:  New onset atrial fibrillation paroxysmal-CHA2DS2-VASc score of 4, age greater than 75, hypertension, CAD -Heart rate 138, has been symptomatic for over a week.  Short of breath. -Metoprolol 25 mg twice a day started. -Eliquis 5 mg twice a day started -Checking CBC and basic metabolic profile as well as TSH and free T4 -Given her current heart rate, persistent symptoms for over a week, I think it makes sense for [place] to proceed with TEE cardioversion.  We will set up for Friday.  Risks and benefits have been explained she is willing to proceed.  I did not want a wait for 3 weeks before cardioversion.  Aortic valve replacement -Bioprosthetic, mild to moderate perivalvular leak previously noted.  TEE prior to cardioversion upcoming. -I will continue with her aspirin 81 mg given her valve.  Obviously if there is any evidence of bleeding especially given her prior peptic ulcer disease which is well controlled by Dr. Rush Landmark, we may need to proceed with Eliquis monotherapy.   Medication Adjustments/Labs and Tests Ordered: Current medicines are reviewed at length with the patient today.  Concerns  regarding medicines are outlined above.  Orders Placed This Encounter  Procedures  . CBC  . Basic metabolic panel  . TSH  . T4, free  . EKG 12-Lead   Meds ordered this encounter  Medications  . metoprolol tartrate (LOPRESSOR) 25 MG tablet    Sig: Take 1 tablet (25 mg total) by mouth 2 (two) times daily.    Dispense:  60 tablet    Refill:  6  . apixaban (ELIQUIS) 5 MG TABS tablet    Sig: Take 1 tablet (5 mg total) by mouth 2 (two) times daily.    Dispense:  60 tablet    Refill:  6    Patient Instructions  Medication Instructions:  Please start Eliquis 5 mg one tablet twice a day.  Start Metoprolol Tartrate 25 mg one tablet twice a day.  Continue all other medications as listed.  *If you need a refill on your cardiac medications before your next appointment, please call your pharmacy*  Lab Work: Please have blood work today (CBC, BMP, TSH, Free T4)  If you have labs (blood work) drawn today and your tests are completely normal, you will receive your results only by: Marland Kitchen MyChart Message (if you have MyChart) OR . A paper copy in the mail If you have any lab test that is abnormal or we need to change your treatment, we will call you to review the results.  Due to recent COVID-19 restrictions implemented by our local and state authorities and in an effort to keep both patients and staff as safe as  possible, our hospital system requires COVID-19 testing prior to certain scheduled hospital procedures.  Please go to [address] [place], [place] [address] on August 11 at  1:15 pm  .  This is a drive up testing site.  You will not need to exit your vehicle.  You will not be billed at the time of testing but may receive a bill later depending on your insurance.  The approximate cost of the test is $100.  You must agree to self-quarantine from the time of your testing until the procedure date on Friday December 09, 2019.  This should included staying home with ONLY the people you live with.   Avoid take-out, grocery store shopping or leaving the house for any non-emergent reason.  Failure to have your COVID-19 test done on the date and time you have been scheduled will result in cancellation of your procedure.  Please call our office at [phone] if you have any questions.  Testing/Procedures: Your physician has requested that you have a TEE/Cardioversion. During a TEE, sound waves are used to create images of your heart. It provides your doctor with information about the size and shape of your heart and how well your heart's chambers and valves are working. In this test, a transducer is attached to the end of a flexible tube that is guided down you throat and into your esophagus (the tube leading from your mouth to your stomach) to get a more detailed image of your heart. Once the TEE has determined that a blood clot is not present, the cardioversion begins. Electrical Cardioversion uses a jolt of electricity to your heart either through paddles or wired patches attached to your chest. This is a controlled, usually prescheduled, procedure. This procedure is done at the hospital and you are not awake during the procedure. You usually go home the day of the procedure.   Follow-Up: At [facility], you and your health needs are our priority.  As part of our continuing mission to provide you with exceptional heart care, we have created designated Provider Care Teams.  These Care Teams include your primary Cardiologist (physician) and Advanced Practice Providers (APPs -  Physician Assistants and Nurse Practitioners) who all work together to provide you with the care you need, when you need it.  We recommend signing up for the patient portal called "MyChart".  Sign up information is provided on this After Visit Summary.  MyChart is used to connect with patients for Virtual Visits (Telemedicine).  Patients are able to view lab/test results, encounter notes, upcoming appointments, etc.  Non-urgent  messages can be sent to your provider as well.   To learn more about what you can do with MyChart, go to [email or web address].    Your next appointment:   2-4 week(s)  The format for your next appointment:   In Person  Provider:   You may see Dr Burt Knack or one of the following Advanced Practice Providers on your designated Care Team:    Richardson Dopp, PA-C  Robbie Lis, [place]   Thank you for choosing [facility]!!      [place] OFFICE [place], [place] [address] Dept: [phone] Loc: [place]  12/06/2019  You are scheduled for a TEE/Cardioversion on Friday August 13 th  with Dr. Johnsie Cancel .  1. Please arrive at the [facility] (Main Entrance A) at [facility]: [address]  [place], [place] [address] at  8 AM. Free valet parking service is available.   Special note: Every effort is made to have your procedure done on time. Please understand that emergencies sometimes delay scheduled procedures.  2. Diet: Nothing to eat/drink after midnight.  3. Labs: Please have today   Covid screen as scheduled above.  4. Medication instructions in preparation for your procedure:  On the morning of your procedure, take your ELIQUIS and any morning medicines NOT listed above.  You may use sips of water.  5. Bring a current list of your medications and current insurance cards. 6. You MUST have a responsible person to drive you home. 7. Someone MUST be with you the first 24 hours after you arrive home or your discharge will be delayed. 8. Please wear clothes that are easy to get on and off and wear slip-on shoes.  Thank you for allowing [place] to care for you!   -- [facility]     Signed, Candee Furbish, MD  12/06/2019 1:21 PM    [place]

## 2019-12-07 ENCOUNTER — Other Ambulatory Visit (HOSPITAL_COMMUNITY)
Admission: RE | Admit: 2019-12-07 | Discharge: 2019-12-07 | Disposition: A | Discharge status: Home / Self Care | Payer: Medicare Other | Source: Ambulatory Visit | Attending: Cardiovascular Disease | Admitting: Cardiovascular Disease

## 2019-12-07 ENCOUNTER — Encounter: Payer: Self-pay | Admitting: Gastroenterology

## 2019-12-07 DIAGNOSIS — Z20822 Contact with and (suspected) exposure to covid-19: Secondary | ICD-10-CM | POA: Diagnosis not present

## 2019-12-07 DIAGNOSIS — R002 Palpitations: Secondary | ICD-10-CM | POA: Insufficient documentation

## 2019-12-07 DIAGNOSIS — Z01812 Encounter for preprocedural laboratory examination: Secondary | ICD-10-CM | POA: Insufficient documentation

## 2019-12-07 DIAGNOSIS — K279 Peptic ulcer, site unspecified, unspecified as acute or chronic, without hemorrhage or perforation: Secondary | ICD-10-CM | POA: Insufficient documentation

## 2019-12-07 DIAGNOSIS — R0609 Other forms of dyspnea: Secondary | ICD-10-CM | POA: Insufficient documentation

## 2019-12-07 DIAGNOSIS — Z8711 Personal history of peptic ulcer disease: Secondary | ICD-10-CM | POA: Insufficient documentation

## 2019-12-07 DIAGNOSIS — I4891 Unspecified atrial fibrillation: Secondary | ICD-10-CM | POA: Insufficient documentation

## 2019-12-07 LAB — BASIC METABOLIC PANEL
BUN/Creatinine Ratio: 18 (ref 12–28)
BUN: 15 mg/dL (ref 8–27)
CO2: 22 mmol/L (ref 20–29)
Calcium: 9.4 mg/dL (ref 8.7–10.3)
Chloride: 101 mmol/L (ref 96–106)
Creatinine, Ser: 0.84 mg/dL (ref 0.57–1.00)
GFR calc Af Amer: 74 mL/min/{1.73_m2} (ref >59)
GFR calc non Af Amer: 64 mL/min/{1.73_m2} (ref >59)
Glucose: 106 mg/dL — ABNORMAL HIGH (ref 65–99)
Potassium: 4.6 mmol/L (ref 3.5–5.2)
Sodium: 138 mmol/L (ref 134–144)

## 2019-12-07 LAB — CBC
Hematocrit: 41.8 % (ref 34.0–46.6)
Hemoglobin: 13.6 g/dL (ref 11.1–15.9)
MCH: 30.8 pg (ref 26.6–33.0)
MCHC: 32.5 g/dL (ref 31.5–35.7)
MCV: 95 fL (ref 79–97)
Platelets: 176 10*3/uL (ref 150–450)
RBC: 4.42 x10E6/uL (ref 3.77–5.28)
RDW: 12.5 % (ref 11.7–15.4)
WBC: 10.9 10*3/uL — ABNORMAL HIGH (ref 3.4–10.8)

## 2019-12-07 LAB — SARS CORONAVIRUS 2 (TAT 6-24 HRS): SARS Coronavirus 2: NEGATIVE

## 2019-12-07 LAB — TSH: TSH: 1.33 u[IU]/mL (ref 0.450–4.500)

## 2019-12-07 LAB — T4, FREE: Free T4: 1.44 ng/dL (ref 0.82–1.77)

## 2019-12-08 NOTE — Progress Notes (Signed)
Pt being transported by [facility] for scheduled procedure tomorrow. [facility] transport can be reached at [phone] per patient.

## 2019-12-09 ENCOUNTER — Ambulatory Visit (HOSPITAL_BASED_OUTPATIENT_CLINIC_OR_DEPARTMENT_OTHER)
Admission: RE | Admit: 2019-12-09 | Discharge: 2019-12-09 | Disposition: A | Discharge status: Home / Self Care | Payer: Medicare Other | Source: Ambulatory Visit | Attending: Cardiology | Admitting: Cardiology

## 2019-12-09 ENCOUNTER — Encounter (HOSPITAL_COMMUNITY): Payer: Self-pay | Admitting: Cardiovascular Disease

## 2019-12-09 ENCOUNTER — Other Ambulatory Visit: Payer: Self-pay

## 2019-12-09 ENCOUNTER — Ambulatory Visit (HOSPITAL_COMMUNITY): Payer: Medicare Other | Admitting: Certified Registered"

## 2019-12-09 ENCOUNTER — Encounter (HOSPITAL_COMMUNITY)
Admission: RE | Disposition: A | Discharge status: Home / Self Care | Payer: Self-pay | Source: Home / Self Care | Attending: Cardiovascular Disease

## 2019-12-09 ENCOUNTER — Ambulatory Visit (HOSPITAL_COMMUNITY)
Admission: RE | Admit: 2019-12-09 | Discharge: 2019-12-09 | Disposition: A | Discharge status: Home / Self Care | Payer: Medicare Other | Attending: Cardiovascular Disease | Admitting: Cardiovascular Disease

## 2019-12-09 DIAGNOSIS — F419 Anxiety disorder, unspecified: Secondary | ICD-10-CM | POA: Insufficient documentation

## 2019-12-09 DIAGNOSIS — E7800 Pure hypercholesterolemia, unspecified: Secondary | ICD-10-CM | POA: Insufficient documentation

## 2019-12-09 DIAGNOSIS — I083 Combined rheumatic disorders of mitral, aortic and tricuspid valves: Secondary | ICD-10-CM | POA: Insufficient documentation

## 2019-12-09 DIAGNOSIS — I252 Old myocardial infarction: Secondary | ICD-10-CM | POA: Diagnosis not present

## 2019-12-09 DIAGNOSIS — K449 Diaphragmatic hernia without obstruction or gangrene: Secondary | ICD-10-CM | POA: Diagnosis not present

## 2019-12-09 DIAGNOSIS — I361 Nonrheumatic tricuspid (valve) insufficiency: Secondary | ICD-10-CM

## 2019-12-09 DIAGNOSIS — Z7982 Long term (current) use of aspirin: Secondary | ICD-10-CM | POA: Insufficient documentation

## 2019-12-09 DIAGNOSIS — Z952 Presence of prosthetic heart valve: Secondary | ICD-10-CM | POA: Diagnosis not present

## 2019-12-09 DIAGNOSIS — Z882 Allergy status to sulfonamides status: Secondary | ICD-10-CM | POA: Insufficient documentation

## 2019-12-09 DIAGNOSIS — Z7901 Long term (current) use of anticoagulants: Secondary | ICD-10-CM | POA: Insufficient documentation

## 2019-12-09 DIAGNOSIS — Z79899 Other long term (current) drug therapy: Secondary | ICD-10-CM | POA: Diagnosis not present

## 2019-12-09 DIAGNOSIS — Z853 Personal history of malignant neoplasm of breast: Secondary | ICD-10-CM | POA: Insufficient documentation

## 2019-12-09 DIAGNOSIS — Z87891 Personal history of nicotine dependence: Secondary | ICD-10-CM | POA: Diagnosis not present

## 2019-12-09 DIAGNOSIS — I10 Essential (primary) hypertension: Secondary | ICD-10-CM | POA: Diagnosis not present

## 2019-12-09 DIAGNOSIS — Z880 Allergy status to penicillin: Secondary | ICD-10-CM | POA: Diagnosis not present

## 2019-12-09 DIAGNOSIS — I4891 Unspecified atrial fibrillation: Secondary | ICD-10-CM

## 2019-12-09 DIAGNOSIS — I2510 Atherosclerotic heart disease of native coronary artery without angina pectoris: Secondary | ICD-10-CM | POA: Diagnosis not present

## 2019-12-09 DIAGNOSIS — I082 Rheumatic disorders of both aortic and tricuspid valves: Secondary | ICD-10-CM | POA: Insufficient documentation

## 2019-12-09 DIAGNOSIS — I340 Nonrheumatic mitral (valve) insufficiency: Secondary | ICD-10-CM

## 2019-12-09 DIAGNOSIS — I480 Paroxysmal atrial fibrillation: Secondary | ICD-10-CM | POA: Insufficient documentation

## 2019-12-09 DIAGNOSIS — I081 Rheumatic disorders of both mitral and tricuspid valves: Secondary | ICD-10-CM | POA: Diagnosis not present

## 2019-12-09 HISTORY — PX: CARDIOVERSION: SHX1299

## 2019-12-09 HISTORY — PX: TEE WITHOUT CARDIOVERSION: SHX5443

## 2019-12-09 LAB — ECHO TEE
AV Mean grad: 8 mmHg
AV Peak grad: 15.7 mmHg
Ao pk vel: 1.98 m/s

## 2019-12-09 SURGERY — ECHOCARDIOGRAM, TRANSESOPHAGEAL
Anesthesia: Monitor Anesthesia Care

## 2019-12-09 MED ORDER — PHENYLEPHRINE 40 MCG/ML (10ML) SYRINGE FOR IV PUSH (FOR BLOOD PRESSURE SUPPORT)
PREFILLED_SYRINGE | INTRAVENOUS | Status: DC | PRN
  Administered 2019-12-09: 120 ug via INTRAVENOUS
  Administered 2019-12-09: 80 ug via INTRAVENOUS

## 2019-12-09 MED ORDER — SODIUM CHLORIDE 0.9 % IV SOLN: INTRAVENOUS | Status: DC

## 2019-12-09 MED ORDER — PROPOFOL 10 MG/ML IV BOLUS
INTRAVENOUS | Status: DC | PRN
  Administered 2019-12-09: 20 mg via INTRAVENOUS

## 2019-12-09 MED ORDER — EPHEDRINE SULFATE-NACL 50-0.9 MG/10ML-% IV SOSY
PREFILLED_SYRINGE | INTRAVENOUS | Status: DC | PRN
  Administered 2019-12-09: 10 mg via INTRAVENOUS

## 2019-12-09 MED ORDER — PROPOFOL 500 MG/50ML IV EMUL
INTRAVENOUS | Status: DC | PRN
  Administered 2019-12-09: 100 ug/kg/min via INTRAVENOUS

## 2019-12-09 NOTE — Discharge Instructions (Signed)
TEE  YOU HAD AN CARDIAC PROCEDURE TODAY: Refer to the procedure report and other information in the discharge instructions given to you for any specific questions about what was found during the examination. If this information does not answer your questions, please call [facility] office at [phone] to clarify.   DIET: Your first meal following the procedure should be a light meal and then it is ok to progress to your normal diet. A half-sandwich or bowl of soup is an example of a good first meal. Heavy or fried foods are harder to digest and may make you feel nauseous or bloated. Drink plenty of fluids but you should avoid alcoholic beverages for 24 hours. If you had a esophageal dilation, please see attached instructions for diet.   ACTIVITY: Your care partner should take you home directly after the procedure. You should plan to take it easy, moving slowly for the rest of the day. You can resume normal activity the day after the procedure however YOU SHOULD NOT DRIVE, use power tools, machinery or perform tasks that involve climbing or major physical exertion for 24 hours (because of the sedation medicines used during the test).   SYMPTOMS TO REPORT IMMEDIATELY: A cardiologist can be reached at any hour. Please call [phone] for any of the following symptoms:  Vomiting of blood or coffee ground material  New, significant abdominal pain  New, significant chest pain or pain under the shoulder blades  Painful or persistently difficult swallowing  New shortness of breath  Black, tarry-looking or red, bloody stools  FOLLOW UP:  Please also call with any specific questions about appointments or follow up tests.   Electrical Cardioversion Electrical cardioversion is the delivery of a jolt of electricity to restore a normal rhythm to the heart. A rhythm that is too fast or is not regular keeps the heart from pumping well. In this procedure, sticky patches or metal paddles are placed on  the chest to deliver electricity to the heart from a device.  Follow these instructions at home:  Do not drive for 24 hours if you were given a sedative during your procedure.  Take over-the-counter and prescription medicines only as told by your health care provider.  Ask your health care provider how to check your pulse. Check it often.  Rest for 48 hours after the procedure or as told by your health care provider.  Avoid or limit your caffeine use as told by your health care provider.  Keep all follow-up visits as told by your health care provider. This is important. Contact a health care provider if:  You feel like your heart is beating too quickly or your pulse is not regular.  You have a serious muscle cramp that does not go away. Get help right away if:  You have discomfort in your chest.  You are dizzy or you feel faint.  You have trouble breathing or you are short of breath.  Your speech is slurred.  You have trouble moving an arm or leg on one side of your body.  Your fingers or toes turn cold or blue. Summary  Electrical cardioversion is the delivery of a jolt of electricity to restore a normal rhythm to the heart.  This procedure may be done right away in an emergency or may be a scheduled procedure if the condition is not an emergency.  Generally, this is a safe procedure.  After the procedure, check your pulse often as told by your health care provider. This  information is not intended to replace advice given to you by your health care provider. Make sure you discuss any questions you have with your health care provider. Document Revised: 11/15/2018 Document Reviewed: 11/15/2018 Elsevier Patient Education  [place].

## 2019-12-09 NOTE — CV Procedure (Signed)
TEE/DCC With 3D Rendering Anesthesia:  [place]/ Propofol  EF 55% 21 mm Edwards magna ease pericardial stented bioprosthetic valve Mean gradient 8 peak 16 mmHg Moderate PVL ? Suture dehiscence at 1:00 on BSA images Mild MR Moderate TR Severe RVE Severe Bi-atrial enlargement  No ASD No LAA thrombus  DCC x 1 200J converted from afib rate 122 to NSR rate 62 bpm No immediate neurologic sequelae   On Rx Eliuis took her dose this am  [name] Rouge MD [facility]

## 2019-12-09 NOTE — Transfer of Care (Signed)
Immediate Anesthesia Transfer of Care Note  Patient: [name]  Procedure(s) Performed: TRANSESOPHAGEAL ECHOCARDIOGRAM (TEE) (N/A ) CARDIOVERSION (N/A )  Patient Location: Endoscopy Unit  Anesthesia Type:MAC  Level of Consciousness: drowsy and responds to stimulation  Airway & Oxygen Therapy: Patient Spontanous Breathing and Patient connected to nasal cannula oxygen  Post-op Assessment: Report given to RN  Post vital signs: Reviewed and stable  Last Vitals:  Vitals Value Taken Time  BP 98/47 12/09/19 0943  Temp    Pulse 47 12/09/19 0944  Resp 23 12/09/19 0944  SpO2 96 % 12/09/19 0944  Vitals shown include unvalidated device data.  Last Pain:  Vitals:   12/09/19 0812  TempSrc: Temporal  PainSc: 0-No pain         Complications: No complications documented.

## 2019-12-09 NOTE — Interval H&P Note (Signed)
History and Physical Interval Note:  12/09/2019 8:31 AM  [name]  has presented today for surgery, with the diagnosis of AFIB.  The various methods of treatment have been discussed with the patient and family. After consideration of risks, benefits and other options for treatment, the patient has consented to  Procedure(s): TRANSESOPHAGEAL ECHOCARDIOGRAM (TEE) (N/A) CARDIOVERSION (N/A) as a surgical intervention.  The patient's history has been reviewed, patient examined, no change in status, stable for surgery.  I have reviewed the patient's chart and labs.  Questions were answered to the patient's satisfaction.     [name] Rouge

## 2019-12-09 NOTE — Progress Notes (Signed)
  Echocardiogram Echocardiogram Transesophageal has been performed.  [name] 12/09/2019, 9:54 AM

## 2019-12-09 NOTE — Anesthesia Preprocedure Evaluation (Addendum)
Anesthesia Evaluation  Patient identified by MRN, date of birth, ID band Patient awake    Reviewed: Allergy & Precautions, NPO status , Patient's Chart, lab work & pertinent test results  Airway Mallampati: II  TM Distance: >3 FB     Dental  (+) Teeth Intact, Dental Advisory Given   Pulmonary former smoker,    breath sounds clear to auscultation       Cardiovascular  Rhythm:Irregular Rate:Normal     Neuro/Psych    GI/Hepatic   Endo/Other    Renal/GU      Musculoskeletal   Abdominal   Peds  Hematology   Anesthesia Other Findings   Reproductive/Obstetrics                            Anesthesia Physical Anesthesia Plan  ASA: III  Anesthesia Plan: General   Post-op Pain Management:    Induction: Intravenous  PONV Risk Score and Plan: Ondansetron  Airway Management Planned: Mask  Additional Equipment:   Intra-op Plan:   Post-operative Plan:   Informed Consent: I have reviewed the patients History and Physical, chart, labs and discussed the procedure including the risks, benefits and alternatives for the proposed anesthesia with the patient or authorized representative who has indicated his/her understanding and acceptance.       Plan Discussed with:   Anesthesia Plan Comments:         Anesthesia Quick Evaluation

## 2019-12-09 NOTE — Anesthesia Procedure Notes (Signed)
Procedure Name: MAC Date/Time: 12/09/2019 9:07 AM Performed by: Barrington Ellison, CRNA Pre-anesthesia Checklist: Patient identified, Emergency Drugs available, Suction available and Patient being monitored Patient Re-evaluated:Patient Re-evaluated prior to induction Oxygen Delivery Method: Nasal cannula

## 2019-12-10 NOTE — Anesthesia Postprocedure Evaluation (Signed)
Anesthesia Post Note  Patient: [name]  Procedure(s) Performed: TRANSESOPHAGEAL ECHOCARDIOGRAM (TEE) (N/A ) CARDIOVERSION (N/A )     Anesthesia Post Evaluation No complications documented.  Last Vitals:  Vitals:   12/09/19 1010 12/09/19 1015  BP: 94/74 (!) 113/54  Pulse: (!) 49 (!) 49  Resp: (!) 25 18  Temp:    SpO2: 97% 97%    Last Pain:  Vitals:   12/09/19 1015  TempSrc:   PainSc: 0-No pain                 Davidlee Jeanbaptiste COKER

## 2019-12-11 ENCOUNTER — Encounter (HOSPITAL_COMMUNITY): Payer: Self-pay | Admitting: Cardiovascular Disease

## 2019-12-12 ENCOUNTER — Ambulatory Visit (HOSPITAL_COMMUNITY): Payer: Medicare Other

## 2019-12-12 ENCOUNTER — Other Ambulatory Visit: Payer: Self-pay

## 2019-12-12 ENCOUNTER — Encounter (HOSPITAL_COMMUNITY): Payer: Self-pay

## 2019-12-12 ENCOUNTER — Encounter: Payer: Self-pay | Admitting: Physician Assistant

## 2019-12-12 ENCOUNTER — Ambulatory Visit (INDEPENDENT_AMBULATORY_CARE_PROVIDER_SITE_OTHER): Payer: Medicare Other | Admitting: Physician Assistant

## 2019-12-12 VITALS — BP 140/98 | HR 110 | Ht 65.0 in | Wt 168.0 lb

## 2019-12-12 DIAGNOSIS — T8203XD Leakage of heart valve prosthesis, subsequent encounter: Secondary | ICD-10-CM | POA: Diagnosis not present

## 2019-12-12 DIAGNOSIS — I2510 Atherosclerotic heart disease of native coronary artery without angina pectoris: Secondary | ICD-10-CM

## 2019-12-12 DIAGNOSIS — I4891 Unspecified atrial fibrillation: Secondary | ICD-10-CM

## 2019-12-12 DIAGNOSIS — I5033 Acute on chronic diastolic (congestive) heart failure: Secondary | ICD-10-CM

## 2019-12-12 DIAGNOSIS — Z952 Presence of prosthetic heart valve: Secondary | ICD-10-CM | POA: Diagnosis not present

## 2019-12-12 MED ORDER — AMIODARONE HCL 200 MG PO TABS: ORAL_TABLET | ORAL | 1 refills | Status: DC

## 2019-12-12 MED ORDER — FUROSEMIDE 20 MG PO TABS
ORAL_TABLET | ORAL | 1 refills | Status: DC
Start: 2019-12-12 — End: 2019-12-23

## 2019-12-12 NOTE — Telephone Encounter (Signed)
The patient reports her sore throat and cough stem from the TEE done Friday. She has no fever and has not left her home since discharge.  Confirmed appointment this afternoon. She was grateful for assistance.

## 2019-12-12 NOTE — Progress Notes (Signed)
Cardiology Office Note:    Date:  12/12/2019   ID:  [name], DOB 02-08-1933, MRN [id number]  PCP:  [name], MD  Cardiologist:  [name], MD   Electrophysiologist:  None   Referring MD: [name], MD   Chief Complaint:  Fatigue    Patient Profile:    [name] is a 86 y.o. female with:   Persistent atrial fibrillation   S/p TEE-DCCV 11/2019  CHA2DS2-VASc=6 (age x 2, female, CAD, CHF, HTN) >> Apixaban    Bicuspid valve, aortic stenosis  S/p bioprosthetic AVR in 2016  Paravalvular leak  Normal coronary arteries prior to AVR  Coronary artery disease  S/p NSTEMI in 01/2019 >> total occlusion of distal LAD felt to be prob embolic event  Ischemic CM, EF 45-50 w ant-apical AK  TEE 8/21: EF 15  Breast CA  Hypertension   Hyperlipidemia   Diastolic CHF   Prior CV studies: TEE 12/09/2019 EF 55, no RWMA, mild LVH, moderately reduced RVSF, moderate RVE, severe BAE, mild MR, moderate to severe TR, AVR with mean gradient 8 & moderate perivalvular leak  Echocardiogram 02/04/2019 EF 45-50, GRII DD, normal RVSF, mild LAE, mild MR, severe TR, AVR with mean gradient 12  Cardiac catheterization 02/04/2019 LAD mid 15, distal 100; D1 20 LCx normal RCA normal  Carotid US 10/30/2017 Bilateral ICA 1-39  History of Present Illness:    [name] was last seen by Dr. Burt Knack in April 2021.  She recently presented to the office and was seen by Dr. Marlou Porch for new onset atrial fibrillation on 12/06/2019.  Her rate was rapid and she was set up for TEE guided cardioversion.  She was placed on Apixaban for anticoagulation as well as metoprolol for rate control.  She underwent TEE guided cardioversion 12/09/2019.  She contacted the office today with symptoms of fatigue and word finding issues.  She is added on for further evaluation.  She is here alone.  She felt good for a brief amount of time after her cardioversion.  However, since then she has  not felt well.  She has not been sleeping well.  She still has some shortness of breath with activity.  She has not had orthopnea or chest pain.  She has not had leg swelling.  She has not had syncope.  She does note bilateral leg pain.  Past Medical History:  Diagnosis Date  . Allergy   . Anemia   . Anxiety   . Aortic valve stenosis, severe   . Breast cancer ([place]) 2007   right  . Cataract   . Hiatal hernia   . Hypercholesteremia   . Hypoglycemia   . Osteopenia   . Personal history of radiation therapy   . Tingling   . Ulcer 2012   bleeding gastric Ulcer    Current Medications: Current Meds  Medication Sig  . acetaminophen (TYLENOL) 500 MG tablet Take 500-1,000 mg by mouth at bedtime as needed for mild pain or moderate pain.  Marland Kitchen ALPRAZolam (XANAX) 0.5 MG tablet TAKE 1 TABLET ONCE DAILY AS NEEDED FOR AN ANXIETY, DO NOT TAKE AT NIGHT.  Marland Kitchen apixaban (ELIQUIS) 5 MG TABS tablet Take 1 tablet (5 mg total) by mouth 2 (two) times daily.  Marland Kitchen aspirin EC 81 MG EC tablet Take 1 tablet (81 mg total) by mouth daily.  . Calcium Carb-Cholecalciferol (CALCIUM 600+D3) 600-400 MG-UNIT TABS Take 1 tablet by mouth daily.  . Chloral Hydrate CRYS Take 10 mLs by mouth  at bedtime as needed (Call  liquid into [facility]).  . Cholecalciferol (VITAMIN D-3) 1000 UNITS CAPS Take 1,000 Units by mouth daily.   . clindamycin (CLEOCIN) 300 MG capsule Take 600 mg by mouth once.   . Coenzyme Q10 (CO Q 10) 100 MG CAPS Take 100 mg by mouth daily.   . ferrous sulfate 325 (65 FE) MG tablet Take 325 mg by mouth every Monday, Wednesday, and Friday.  . fluticasone (FLONASE) 50 MCG/ACT nasal spray Place 2 sprays into both nostrils daily.  Marland Kitchen HYDROcodone-homatropine (HYCODAN) 5-1.5 MG/5ML syrup TAKE 5ML BY MOUTH EVERY 8 HOURS AS NEEDED FOR COUGH.  . levocetirizine (XYZAL) 5 MG tablet Take 5 mg by mouth at bedtime as needed for allergies.   Marland Kitchen losartan (COZAAR) 25 MG tablet Take 1 tablet (25 mg total) by mouth  daily.  . Magnesium 400 MG CAPS Take 400 mg by mouth daily.   . metoprolol tartrate (LOPRESSOR) 25 MG tablet Take 1 tablet (25 mg total) by mouth 2 (two) times daily.  . nitroGLYCERIN (NITROSTAT) 0.4 MG SL tablet Place 1 tablet (0.4 mg total) under the tongue every 5 (five) minutes as needed for chest pain (CP or SOB).  Marland Kitchen Ophthalmic Irrigation Solution (OCUSOFT EYE [place] OP) Apply 1 application to eye 2 (two) times daily as needed (dry eyes). Morning and before bed  . pantoprazole (PROTONIX) 40 MG tablet Take 1 tablet (40 mg total) by mouth 2 (two) times daily.  . rosuvastatin (CRESTOR) 20 MG tablet Take 1 tablet (20 mg total) by mouth daily.  Marland Kitchen tretinoin (RETIN-A) 0.05 % cream Apply 1 application topically 3 (three) times a week.   . Wheat Dextrin (BENEFIBER DRINK MIX PO) Take 10 mLs by mouth every morning. Mix with 1/2 a glass water,     Allergies:   Lactose intolerance (gi), Amoxicillin-pot clavulanate, Penicillin g, Penicillins, Sulfa antibiotics, Sulfamethoxazole, and Sulfonamide derivatives   Social History   Tobacco Use  . Smoking status: Former Smoker    Packs/day: 0.50    Years: 15.00    Pack years: 7.50    Types: Cigarettes    Start date: 33    Quit date: 1986    Years since quitting: 35.6  . Smokeless tobacco: Never Used  . Tobacco comment: quit smoking 1980  Vaping Use  . Vaping Use: Never used  Substance Use Topics  . Alcohol use: Yes    Alcohol/week: 7.0 standard drinks    Types: 7 Glasses of wine per week    Comment: wine nightly with dinner  . Drug use: No     Family Hx: The patient's family history includes Cancer (age of onset: 60) in her cousin; Colon cancer in her mother; Hypertension in her mother; Kidney disease in her father; Rectal cancer in her mother. There is no history of Esophageal cancer, Stomach cancer, or Colon polyps.  ROS   EKGs/Labs/Other Test Reviewed:    EKG:  EKG is   ordered today.  The ekg ordered today demonstrates atrial  fibrillation, HR 110, normal axis, poor R wave progression, nonspecific ST-T wave changes, QTC 476  Recent Labs: 06/20/2019: ALT 17 12/06/2019: BUN 15; Creatinine, Ser 0.84; Hemoglobin 13.6; Platelets 176; Potassium 4.6; Sodium 138; TSH 1.330   Recent Lipid Panel Lab Results  Component Value Date/Time   CHOL 230 (H) 06/20/2019 09:29 AM   TRIG 73.0 06/20/2019 09:29 AM   HDL 94.90 06/20/2019 09:29 AM   CHOLHDL 2 06/20/2019 09:29 AM   LDLCALC 120 (  H) 06/20/2019 09:29 AM   LDLDIRECT 144.0 11/29/2012 04:01 PM    Physical Exam:    VS:  BP (!) 140/98   Pulse (!) 110   Ht 5\' 5"  (1.651 m)   Wt 168 lb (76.2 kg)   SpO2 95%   BMI 27.96 kg/m     Wt Readings from Last 3 Encounters:  12/12/19 168 lb (76.2 kg)  12/09/19 158 lb (71.7 kg)  12/06/19 163 lb 9.6 oz (74.2 kg)     Constitutional:      Appearance: Healthy appearance. Not in distress.  Neck:     Vascular: JVD elevated.  Pulmonary:     Effort: Pulmonary effort is normal.     Breath sounds: No wheezing. Rales (very faint bibasilar) present.  Cardiovascular:     Tachycardia present. Irregularly irregular rhythm. Normal S1. Normal S2.     Murmurs: There is no murmur.  Edema:    Ankle: bilateral trace edema of the ankle. Abdominal:     Palpations: Abdomen is soft. There is no hepatomegaly.  Skin:    General: Skin is warm and dry.  Neurological:     Mental Status: Alert and oriented to person, place and time.     Cranial Nerves: Cranial nerves are intact.      ASSESSMENT & PLAN:    1. Atrial fibrillation with rapid ventricular response ([place]) She has reverted back to atrial fibrillation.  Her rate is somewhat elevated.  She is symptomatic with this.  She has not missed any doses of Apixaban.  Therefore, I recommended proceeding with rhythm control.  I have recommended starting amiodarone.  This should help control her HR better.  I reviewed all of this with Dr. Angelena Form (attending MD) who agreed.  -Continue  Apixaban   -Continue metoprolol tartrate  -Start amiodarone 400 mg twice daily x1 week, then 200 mg twice daily  -Follow-up in 2 weeks  -Arrange repeat DCCV if still in A. fib  2. Acute on chronic diastolic CHF (congestive heart failure) ([place]) She seems to be somewhat volume overloaded.  Her JVD is elevated.  I will place her on low-dose furosemide 20 mg daily x3 days.  She will then take furosemide 20 mg every Monday, Wednesday, Friday, obtain BMET in 1 week.  3. S/P AVR (aortic valve replacement) 4. Paravalvular leak of prosthetic heart valve, subsequent encounter Recent transesophageal echocardiogram demonstrated moderate paravalvular leak.  Continue SBE prophylaxis.  5. Coronary artery disease involving native coronary artery of native heart without angina pectoris I suspect her embolic event to the distal LAD last year was likely related to atrial fibrillation.  She is not currently having anginal symptoms.  Continue beta-blocker, rosuvastatin.    Dispo:  Return in about 2 weeks (around 12/26/2019) for Close Follow Up, w/ Richardson Dopp, PA-C, in person.   Medication Adjustments/Labs and Tests Ordered: Current medicines are reviewed at length with the patient today.  Concerns regarding medicines are outlined above.  Tests Ordered: Orders Placed This Encounter  Procedures  . Basic metabolic panel  . EKG 12-Lead   Medication Changes: Meds ordered this encounter  Medications  . amiodarone (PACERONE) 200 MG tablet    Sig: Take 2 tablets by mouth twice a day for 1 week, then take 1 tablet by mouth twice a day    Dispense:  75 tablet    Refill:  1  . furosemide (LASIX) 20 MG tablet    Sig: Take 1 tablet by mouth once a day for 3 days,  then take 1 tablet by mouth three times a week on Monday/Wednesday/Friday    Dispense:  20 tablet    Refill:  1    Signed, Richardson Dopp, PA-C  12/12/2019 3:09 PM    [facility] [address], [place], [place]   [address] Phone: [phone]; Fax: [phone]

## 2019-12-12 NOTE — Progress Notes (Signed)
Let's repeat a chest wall echo in October. There is enough going on with RV dysfunction, TR, and paravalvular leak, that it would be appropriate to have a 12 month study for comparison, especially now that she is back in sinus rhythm I am hopeful that things are looking better.

## 2019-12-12 NOTE — Patient Instructions (Addendum)
Medication Instructions:  Your physician has recommended you make the following change in your medication:   1) Start Amiodarone 200 mg, 2 tablets by mouth twice a day for 7 days, then decrease to 200 mg, 1 tablet by mouth twice a day 2) Start Lasix 20 mg, 1 tablet by mouth once a day for 3 days, then take Lasix 20 mg, 1 tablet three times a week   *If you need a refill on your cardiac medications before your next appointment, please call your pharmacy*  Lab Work: Your physician recommends that you return for lab work in 1 week on 12/19/19  Testing/Procedures: None ordered today  Follow-Up: On 12/27/19 at 11:45AM with Richardson Dopp, PA-C

## 2019-12-14 ENCOUNTER — Telehealth: Payer: Self-pay

## 2019-12-14 DIAGNOSIS — Z952 Presence of prosthetic heart valve: Secondary | ICD-10-CM

## 2019-12-14 DIAGNOSIS — T8203XD Leakage of heart valve prosthesis, subsequent encounter: Secondary | ICD-10-CM

## 2019-12-14 NOTE — Telephone Encounter (Signed)
-----   Message from Liliane Shi, [place] sent at 12/13/2019  5:38 PM EDT ----- Discussed with Dr. Burt Knack She needs a repeat echocardiogram in Oct when he sees her. It can be several days or 1 week before or the day of the appt. Thanks [employer]

## 2019-12-14 NOTE — Telephone Encounter (Signed)
I called and left patient a detailed message advising her that per Nicki Reaper and Dr. Burt Knack echocardiogram needs to be done before or on the same day as appointment with Dr. Burt Knack. Ok to leave detailed message per patient DPR. Order for echocardiogram in.

## 2019-12-15 ENCOUNTER — Telehealth: Payer: Self-pay | Admitting: Cardiovascular Disease

## 2019-12-15 NOTE — Telephone Encounter (Signed)
Pt c/o swelling: STAT is pt has developed SOB within 24 hours  1) How much weight have you gained and in what time span? She states she has gained weight with the swelling - but she isn't sure how long her ankles have been swollen. She states one day she looked down and they did not look normal.   2) If swelling, where is the swelling located? Ankles  3) Are you currently taking a fluid pill? She has been prescribed furosemide (LASIX) 20 MG tablet - but is unsure of the directions   4) Are you currently SOB? No - she states that if she "behaves" and walks slow then she does not get sob   5) Do you have a log of your daily weights (if so, list)? No   6) Have you gained 3 pounds in a day or 5 pounds in a week? Unsure  7) Have you traveled recently? No   She is wanting to know if with the swelling, if she needs to be on a diet. She is also requesting clarification on how she needs to be taking furosemide (LASIX) 20 MG tablet. Please advise.

## 2019-12-15 NOTE — Telephone Encounter (Signed)
Left message for patient that since she was not reached, a MyChart message will be sent addressing her questions. Instructed her to respond if she needs further assistance.

## 2019-12-18 NOTE — Progress Notes (Signed)
[name] - can we make sure she has follow-up? thx

## 2019-12-19 ENCOUNTER — Other Ambulatory Visit: Payer: Self-pay

## 2019-12-19 ENCOUNTER — Other Ambulatory Visit: Payer: Medicare Other

## 2019-12-19 DIAGNOSIS — I4891 Unspecified atrial fibrillation: Secondary | ICD-10-CM

## 2019-12-19 DIAGNOSIS — I2510 Atherosclerotic heart disease of native coronary artery without angina pectoris: Secondary | ICD-10-CM | POA: Diagnosis not present

## 2019-12-19 DIAGNOSIS — Z952 Presence of prosthetic heart valve: Secondary | ICD-10-CM

## 2019-12-19 DIAGNOSIS — T8203XD Leakage of heart valve prosthesis, subsequent encounter: Secondary | ICD-10-CM | POA: Diagnosis not present

## 2019-12-19 DIAGNOSIS — I5033 Acute on chronic diastolic (congestive) heart failure: Secondary | ICD-10-CM | POA: Diagnosis not present

## 2019-12-19 LAB — BASIC METABOLIC PANEL
BUN/Creatinine Ratio: 17 (ref 12–28)
BUN: 16 mg/dL (ref 8–27)
CO2: 20 mmol/L (ref 20–29)
Calcium: 9.1 mg/dL (ref 8.7–10.3)
Chloride: 103 mmol/L (ref 96–106)
Creatinine, Ser: 0.95 mg/dL (ref 0.57–1.00)
GFR calc Af Amer: 64 mL/min/{1.73_m2} (ref >59)
GFR calc non Af Amer: 55 mL/min/{1.73_m2} — ABNORMAL LOW (ref >59)
Glucose: 129 mg/dL — ABNORMAL HIGH (ref 65–99)
Potassium: 4.5 mmol/L (ref 3.5–5.2)
Sodium: 139 mmol/L (ref 134–144)

## 2019-12-21 NOTE — Telephone Encounter (Signed)
Instructed the patient to increase Lasix to 20 mg daily. She reports 15 pound weight gain in the last 2.5 weeks. She states the Lasix didn't really help that much and she isn't urinating as much as she thought she would.  She has DOE and leg aching only when she walks.  Discussed with Nicki Reaper, who states she needs to be seen tomorrow.  There are no appointments at this time. Will talk to DOD in the morning for an appointment. In the meantime, will instruct the patient to take Lasix 40 mg tomorrow morning.

## 2019-12-21 NOTE — Telephone Encounter (Signed)
Her leg aching may be from being in atrial fibrillation. She did have some volume excess when I saw her. Yes, increase her Furosemide to 20 mg once daily. Does she feel worse than when I saw her?  If so, we should see her sooner. Richardson Dopp, PA-C    12/21/2019 1:57 PM

## 2019-12-22 DIAGNOSIS — R059 Cough, unspecified: Secondary | ICD-10-CM | POA: Insufficient documentation

## 2019-12-22 NOTE — Patient Instructions (Addendum)
   A Ct of your chest was ordered for November to follow up your lung nodule.    Medications reviewed and updated.  Changes include :   none  Your prescription(s) have been submitted to your pharmacy. Please take as directed and contact our office if you believe you are having problem(s) with the medication(s).     Please followup in 6 months

## 2019-12-22 NOTE — Progress Notes (Signed)
Subjective:    Patient ID: [name], female    DOB: 1933/01/12, 86 y.o.   MRN: [id number]  HPI The patient is here for follow up of their chronic medical problems, including htn, Afib, CAD, hyperlipidemia, prediabetes, anxiety, insomnia, osteopenia, h/o PUD, B12 def  She walks regularly, but has been since getting A. fib.  She had new onset afib earlier this month while at GI.  She has seen cardiology.  She had a cardioversion on 8/13.  It worked temporarily, but then she went back into A. fib.  She is 12 lbs over on fluid -this started about a week and a half ago.  She just saw cardiology this morning and her lasix was increased to 2 a day today.   She is scheduled for a cardioversion 9/3  She is SOB.  Until three days ago she could not walk 10 steps w/o sitting down.. That has improved.  She is not able to do her usually walking.  She has had increased difficulty sleeping and has used slightly more of her sleep medication than usual.  Uses cough syrup at night prn only.  Medications and allergies reviewed with patient and updated if appropriate.  Patient Active Problem List   Diagnosis Date Noted  . Cough 12/22/2019  . Palpitations 12/07/2019  . Atrial fibrillation ([place]) 12/07/2019  . PUD (peptic ulcer disease) 12/07/2019  . Dyspnea on exertion 12/07/2019  . Eczema 07/27/2019  . CAD (coronary artery disease) 06/23/2019  . Epistaxis 04/19/2019  . NSTEMI (non-ST elevated myocardial infarction) ([place])   . Iron deficiency 12/26/2018  . Anemia 12/26/2018  . B12 deficiency 12/23/2018  . Discoloration of skin of finger 11/11/2018  . Chronic gastric ulcer without hemorrhage and without perforation 06/05/2018  . Constipation, chronic 06/05/2018  . Callus of foot 03/04/2018  . Hammertoes of both feet 03/04/2018  . History of peptic ulcer disease 12/21/2017  . Hypertension 12/21/2017  . Syncope 12/14/2017  . Macrocytic anemia 12/14/2017  . Near syncope 12/14/2017    . Left sided numbness 10/19/2017  . Small vessel disease, cerebrovascular 10/19/2017  . Numbness and tingling of left arm and leg 10/13/2017  . Anxiety 10/02/2017  . Macular pucker, right eye 07/24/2017  . Pseudophakia of both eyes 07/24/2017  . Prediabetes 09/29/2016  . Lung nodule < 6cm on CT 09/29/2016  . Leg cramps 01/29/2016  . Insomnia 09/12/2015  . Rectal bleed 09/12/2015  . S/P AVR 11/16/2014  . Branch retinal vein occlusion 10/12/2013  . Iris nevus, right 10/12/2013  . Aortic valve stenosis, severe   . Allergic rhinitis 09/10/2009  . Hyperlipidemia 11/30/2006  . Mitral valve disease 10/29/2006  . Osteopenia 10/29/2006  . History of right breast cancer 10/29/2006    Current Outpatient Medications on File Prior to Visit  Medication Sig Dispense Refill  . acetaminophen (TYLENOL) 500 MG tablet Take 500-1,000 mg by mouth at bedtime as needed for mild pain or moderate pain.    Marland Kitchen ALPRAZolam (XANAX) 0.5 MG tablet TAKE 1 TABLET ONCE DAILY AS NEEDED FOR AN ANXIETY, DO NOT TAKE AT NIGHT. 90 tablet 0  . amiodarone (PACERONE) 200 MG tablet Take 2 tablets by mouth twice a day for 1 week, then take 1 tablet by mouth twice a day 75 tablet 1  . apixaban (ELIQUIS) 5 MG TABS tablet Take 1 tablet (5 mg total) by mouth 2 (two) times daily. 60 tablet 6  . aspirin EC 81 MG EC tablet Take 1 tablet (81 mg  total) by mouth daily. 90 tablet 3  . Calcium Carb-Cholecalciferol (CALCIUM 600+D3) 600-400 MG-UNIT TABS Take 1 tablet by mouth daily.    . Chloral Hydrate CRYS Take 10 mLs by mouth at bedtime as needed (Call  liquid into [place]). 900 g 1  . Cholecalciferol (VITAMIN D-3) 1000 UNITS CAPS Take 1,000 Units by mouth daily.     . clindamycin (CLEOCIN) 300 MG capsule Take 600 mg by mouth once.     . Coenzyme Q10 (CO Q 10) 100 MG CAPS Take 100 mg by mouth daily.     . ferrous sulfate 325 (65 FE) MG tablet Take 325 mg by mouth every Monday, Wednesday, and Friday.    . fluticasone  (FLONASE) 50 MCG/ACT nasal spray Place 2 sprays into both nostrils daily. 16 g 6  . HYDROcodone-homatropine (HYCODAN) 5-1.5 MG/5ML syrup TAKE 5ML BY MOUTH EVERY 8 HOURS AS NEEDED FOR COUGH. 120 mL 0  . levocetirizine (XYZAL) 5 MG tablet Take 5 mg by mouth at bedtime as needed for allergies.     . Magnesium 400 MG CAPS Take 400 mg by mouth daily.     . metoprolol tartrate (LOPRESSOR) 25 MG tablet Take 1 tablet (25 mg total) by mouth 2 (two) times daily. 60 tablet 6  . nitroGLYCERIN (NITROSTAT) 0.4 MG SL tablet Place 1 tablet (0.4 mg total) under the tongue every 5 (five) minutes as needed for chest pain (CP or SOB). 25 tablet 3  . Ophthalmic Irrigation Solution (OCUSOFT EYE [place] OP) Apply 1 application to eye 2 (two) times daily as needed (dry eyes). Morning and before bed    . pantoprazole (PROTONIX) 40 MG tablet Take 1 tablet (40 mg total) by mouth 2 (two) times daily. 180 tablet 4  . rosuvastatin (CRESTOR) 20 MG tablet Take 1 tablet (20 mg total) by mouth daily. 90 tablet 3  . tretinoin (RETIN-A) 0.05 % cream Apply 1 application topically 3 (three) times a week.     . Wheat Dextrin (BENEFIBER DRINK MIX PO) Take 10 mLs by mouth every morning. Mix with 1/2 a glass water,     No current facility-administered medications on file prior to visit.    Past Medical History:  Diagnosis Date  . Allergy   . Anemia   . Anxiety   . Aortic valve stenosis, severe   . Breast cancer ([place]) 2007   right  . Cataract   . Hiatal hernia   . Hypercholesteremia   . Hypoglycemia   . Osteopenia   . Personal history of radiation therapy   . Tingling   . Ulcer 2012   bleeding gastric Ulcer    Past Surgical History:  Procedure Laterality Date  . AORTIC VALVE REPLACEMENT N/A 11/16/2014   Procedure: AORTIC VALVE REPLACEMENT (AVR);  Surgeon: Gaye Pollack, MD;  Location: [place];  Service: Open Heart Surgery;  Laterality: N/A;  . BIOPSY  12/15/2017   Procedure: BIOPSY;  Surgeon: Rush Landmark Telford Nab., MD;   Location: [facility];  Service: Gastroenterology;;  . BREAST BIOPSY    . BREAST LUMPECTOMY Right    2005?  Marland Kitchen BREAST SURGERY    . CARDIAC CATHETERIZATION N/A 10/18/2014   Procedure: Right/Left Heart Cath and Coronary Angiography;  Surgeon: [name], MD;  Location: [facility];  Service: Cardiovascular;  Laterality: N/A;  . CARDIOVERSION N/A 12/09/2019   Procedure: CARDIOVERSION;  Surgeon: Josue Hector, MD;  [facility];  Service: Cardiovascular;  Laterality: N/A;  . CORONARY  ANGIOGRAPHY N/A 02/04/2019   Procedure: CORONARY ANGIOGRAPHY;  Surgeon: Nelva Bush, MD;  Location: [place] [facility];  Service: Cardiovascular;  Laterality: N/A;  . COSMETIC SURGERY     face  . ESOPHAGOGASTRODUODENOSCOPY (EGD) WITH PROPOFOL N/A 12/15/2017   Procedure: ESOPHAGOGASTRODUODENOSCOPY (EGD) WITH PROPOFOL;  Surgeon: Rush Landmark Telford Nab., MD;  Location: [place];  Service: Gastroenterology;  Laterality: N/A;  . PARATHYROIDECTOMY    . TEE WITHOUT CARDIOVERSION N/A 11/16/2014   Procedure: TRANSESOPHAGEAL ECHOCARDIOGRAM (TEE);  Surgeon: Gaye Pollack, MD;  Location: [place];  Service: Open Heart Surgery;  Laterality: N/A;  . TEE WITHOUT CARDIOVERSION N/A 12/09/2019   Procedure: TRANSESOPHAGEAL ECHOCARDIOGRAM (TEE);  Surgeon: Josue Hector, MD;  Location: [facility] ENDOSCOPY;  Service: Cardiovascular;  Laterality: N/A;  . TONSILLECTOMY      Social History   Socioeconomic History  . Marital status: Unknown    Spouse name: Not on file  . Number of children: 3  . Years of education: 16  . Highest education level: Bachelor's degree (e.g., BA, AB, BS)  Occupational History  . Occupation: Retired  Tobacco Use  . Smoking status: Former Smoker    Packs/day: 0.50    Years: 15.00    Pack years: 7.50    Types: Cigarettes    Start date: 65    Quit date: 1986    Years since quitting: 35.6  . Smokeless tobacco: Never Used  . Tobacco comment: quit smoking 1980  Vaping Use  .  Vaping Use: Never used  Substance and Sexual Activity  . Alcohol use: Yes    Alcohol/week: 7.0 standard drinks    Types: 7 Glasses of wine per week    Comment: wine nightly with dinner  . Drug use: No  . Sexual activity: Not Currently  Other Topics Concern  . Not on file  Social History Narrative   ** Merged History Encounter **       Lives at [facility] with her husband. Right-handed. Caffeine use: 3-4 cups per day (some tea, mixes coffee with decaf).   Social Determinants of Health   Financial Resource Strain:   . Difficulty of Paying Living Expenses: Not on file  Food Insecurity:   . Worried About Charity fundraiser in the Last Year: Not on file  . Ran Out of Food in the Last Year: Not on file  Transportation Needs:   . Lack of Transportation (Medical): Not on file  . Lack of Transportation (Non-Medical): Not on file  Physical Activity:   . Days of Exercise per Week: Not on file  . Minutes of Exercise per Session: Not on file  Stress:   . Feeling of Stress : Not on file  Social Connections:   . Frequency of Communication with Friends and Family: Not on file  . Frequency of Social Gatherings with Friends and Family: Not on file  . Attends Religious Services: Not on file  . Active Member of Clubs or Organizations: Not on file  . Attends Archivist Meetings: Not on file  . Marital Status: Not on file    Family History  Problem Relation Age of Onset  . Hypertension Mother   . Rectal cancer Mother   . Colon cancer Mother   . Kidney disease Father   . Cancer Cousin 63       breast and ovarian   . Esophageal cancer Neg Hx   . Stomach cancer Neg Hx   . Colon polyps Neg Hx  Review of Systems  Constitutional: Negative for fever.  Respiratory: Positive for cough (little more) and shortness of breath. Negative for wheezing.   Cardiovascular: Positive for leg swelling. Negative for chest pain and palpitations.  Neurological: Negative for  light-headedness and headaches.       Objective:   Vitals:   12/23/19 1033  BP: 122/76  Pulse: 91  Temp: 98.3 F (36.8 C)  SpO2: 97%   BP Readings from Last 3 Encounters:  12/23/19 122/76  12/23/19 128/76  12/12/19 (!) 140/98   Wt Readings from Last 3 Encounters:  12/23/19 171 lb (77.6 kg)  12/23/19 170 lb (77.1 kg)  12/12/19 168 lb (76.2 kg)   Body mass index is 28.46 kg/m.   Physical Exam    Constitutional: Appears well-developed and well-nourished. No distress.  HENT:  Head: Normocephalic and atraumatic.  Neck: Neck supple. No tracheal deviation present. No thyromegaly present.  No cervical lymphadenopathy Cardiovascular: Normal rate, regular rhythm and normal heart sounds.   2/6 systolic murmur heard. No carotid bruit .  1+ bilateral lower extremity pitting edema Pulmonary/Chest: Effort normal and breath sounds normal. No respiratory distress. No has no wheezes. No rales.  Skin: Skin is warm and dry. Not diaphoretic.  Psychiatric: Normal mood and affect. Behavior is normal.      Assessment & Plan:    See Problem List for Assessment and Plan of chronic medical problems.    This visit occurred during the SARS-CoV-2 public health emergency.  Safety protocols were in place, including screening questions prior to the visit, additional usage of staff PPE, and extensive cleaning of exam room while observing appropriate contact time as indicated for disinfecting solutions.

## 2019-12-22 NOTE — Telephone Encounter (Signed)
The patient feels slightly better today and has lost 3-4 pounds (despite still saying she isn't urinating much).  She will take Lasix 40 mg today and 20 mg tomorrow and then come for evaluation with Dr. Burt Knack at [facility]. She will call if symptoms worsen prior to that time.

## 2019-12-23 ENCOUNTER — Ambulatory Visit (INDEPENDENT_AMBULATORY_CARE_PROVIDER_SITE_OTHER): Payer: Medicare Other | Admitting: Internal Medicine

## 2019-12-23 ENCOUNTER — Encounter: Payer: Self-pay | Admitting: Cardiovascular Disease

## 2019-12-23 ENCOUNTER — Other Ambulatory Visit: Payer: Self-pay

## 2019-12-23 ENCOUNTER — Ambulatory Visit (INDEPENDENT_AMBULATORY_CARE_PROVIDER_SITE_OTHER): Payer: Medicare Other | Admitting: Cardiovascular Disease

## 2019-12-23 ENCOUNTER — Encounter: Payer: Self-pay | Admitting: Internal Medicine

## 2019-12-23 VITALS — BP 122/76 | HR 91 | Temp 98.3°F | Ht 65.0 in | Wt 171.0 lb

## 2019-12-23 VITALS — BP 128/76 | HR 94 | Ht 65.0 in | Wt 170.0 lb

## 2019-12-23 DIAGNOSIS — I2510 Atherosclerotic heart disease of native coronary artery without angina pectoris: Secondary | ICD-10-CM | POA: Diagnosis not present

## 2019-12-23 DIAGNOSIS — R05 Cough: Secondary | ICD-10-CM | POA: Diagnosis not present

## 2019-12-23 DIAGNOSIS — T8203XD Leakage of heart valve prosthesis, subsequent encounter: Secondary | ICD-10-CM

## 2019-12-23 DIAGNOSIS — R918 Other nonspecific abnormal finding of lung field: Secondary | ICD-10-CM

## 2019-12-23 DIAGNOSIS — F419 Anxiety disorder, unspecified: Secondary | ICD-10-CM

## 2019-12-23 DIAGNOSIS — I5032 Chronic diastolic (congestive) heart failure: Secondary | ICD-10-CM | POA: Diagnosis not present

## 2019-12-23 DIAGNOSIS — F5101 Primary insomnia: Secondary | ICD-10-CM

## 2019-12-23 DIAGNOSIS — I480 Paroxysmal atrial fibrillation: Secondary | ICD-10-CM

## 2019-12-23 DIAGNOSIS — E782 Mixed hyperlipidemia: Secondary | ICD-10-CM | POA: Diagnosis not present

## 2019-12-23 DIAGNOSIS — R7303 Prediabetes: Secondary | ICD-10-CM

## 2019-12-23 DIAGNOSIS — I10 Essential (primary) hypertension: Secondary | ICD-10-CM

## 2019-12-23 DIAGNOSIS — I4891 Unspecified atrial fibrillation: Secondary | ICD-10-CM | POA: Diagnosis not present

## 2019-12-23 DIAGNOSIS — R059 Cough, unspecified: Secondary | ICD-10-CM

## 2019-12-23 MED ORDER — POTASSIUM CHLORIDE ER 10 MEQ PO TBCR
10.0000 meq | EXTENDED_RELEASE_TABLET | Freq: Every day | ORAL | 3 refills | Status: DC
Start: 2019-12-23 — End: 2021-01-03

## 2019-12-23 MED ORDER — CHLORAL HYDRATE CRYS: 10.0000 mL | CRYSTALS | Freq: Every evening | 1 refills | Status: DC | PRN

## 2019-12-23 MED ORDER — FUROSEMIDE 40 MG PO TABS: 40.0000 mg | ORAL_TABLET | Freq: Every day | ORAL | 3 refills | Status: DC

## 2019-12-23 NOTE — Assessment & Plan Note (Signed)
Chronic Takes chloral hydrate Will continue, refill today

## 2019-12-23 NOTE — Progress Notes (Signed)
Cardiology Office Note:    Date:  12/25/2019   ID:  [name], DOB Dec 18, 1932, MRN [id number]  PCP:  [name], MD  [place] Cardiologist:  [name], MD  [name] Electrophysiologist:  None   Referring MD: [name], MD   No chief complaint on file.   History of Present Illness:    [name] is a 86 y.o. female with a hx of:  Persistent atrial fibrillation  ? S/p TEE-DCCV 11/2019 ? CHA2DS2-VASc=6 (age x 2, female, CAD, CHF, HTN) >> Apixaban    Bicuspid valve, aortic stenosis ? S/p bioprosthetic AVR in 2016 ? Paravalvular leak ? Normal coronary arteries prior to AVR  Coronary artery disease ? S/p NSTEMI in 01/2019 >> total occlusion of distal LAD felt to be prob embolic event ? Ischemic CM, EF 45-50 w ant-apical AK ? TEE 8/21: EF 55  Breast CA  Hypertension   Hyperlipidemia   Diastolic CHF   The patient developed new onset afib diagnosed 12-06-2019. She underwent TEE-guided cardioversion. She has had early recurrence of atrial fibrillation and was started on amiodarone by Richardson Dopp with plans for early follow-up to arrange repeat DCCV if still in atrial fibrillation. She presents today for that follow-up. She is here with her husband today. She has been short of breath with activity. When she walks up a slight grade, she is short of breath with a low level of activity. She does not have perception of atrial fibrillation. She denies chest pain. She complains of poor energy. Her daughter is conferenced in from [place] during the visit today.   Past Medical History:  Diagnosis Date  . Allergy   . Anemia   . Anxiety   . Aortic valve stenosis, severe   . Breast cancer ([place]) 2007   right  . Cataract   . Hiatal hernia   . Hypercholesteremia   . Hypoglycemia   . Osteopenia   . Personal history of radiation therapy   . Tingling   . Ulcer 2012   bleeding gastric Ulcer    Past Surgical History:  Procedure  Laterality Date  . AORTIC VALVE REPLACEMENT N/A 11/16/2014   Procedure: AORTIC VALVE REPLACEMENT (AVR);  Surgeon: Gaye Pollack, MD;  Location: [facility];  Service: Open Heart Surgery;  Laterality: N/A;  . BIOPSY  12/15/2017   Procedure: BIOPSY;  Surgeon: Rush Landmark Telford Nab., MD;  Location: [facility];  Service: Gastroenterology;;  . BREAST BIOPSY    . BREAST LUMPECTOMY Right    2005?  Marland Kitchen BREAST SURGERY    . CARDIAC CATHETERIZATION N/A 10/18/2014   Procedure: Right/Left Heart Cath and Coronary Angiography;  Surgeon: [name], MD;  Location: [facility] CV LAB;  Service: Cardiovascular;  Laterality: N/A;  . CARDIOVERSION N/A 12/09/2019   Procedure: CARDIOVERSION;  Surgeon: Josue Hector, MD;  Location: [facility] ENDOSCOPY;  Service: Cardiovascular;  Laterality: N/A;  . CORONARY ANGIOGRAPHY N/A 02/04/2019   Procedure: CORONARY ANGIOGRAPHY;  Surgeon: Nelva Bush, MD;  Location: [place] CV LAB;  Service: Cardiovascular;  Laterality: N/A;  . COSMETIC SURGERY     face  . ESOPHAGOGASTRODUODENOSCOPY (EGD) WITH PROPOFOL N/A 12/15/2017   Procedure: ESOPHAGOGASTRODUODENOSCOPY (EGD) WITH PROPOFOL;  Surgeon: Rush Landmark Telford Nab., MD;  Location: [facility];  Service: Gastroenterology;  Laterality: N/A;  . PARATHYROIDECTOMY    . TEE WITHOUT CARDIOVERSION N/A 11/16/2014   Procedure: TRANSESOPHAGEAL ECHOCARDIOGRAM (TEE);  Surgeon: Gaye Pollack, MD;  Location: [facility];  Service: Open Heart Surgery;  Laterality: N/A;  . TEE WITHOUT CARDIOVERSION N/A 12/09/2019   Procedure: TRANSESOPHAGEAL ECHOCARDIOGRAM (TEE);  Surgeon: Josue Hector, MD;  Location: [facility] ENDOSCOPY;  Service: Cardiovascular;  Laterality: N/A;  . TONSILLECTOMY      Current Medications: Current Meds  Medication Sig  . acetaminophen (TYLENOL) 500 MG tablet Take 500-1,000 mg by mouth at bedtime as needed for mild pain or moderate pain.  Marland Kitchen ALPRAZolam (XANAX) 0.5 MG tablet TAKE 1 TABLET ONCE DAILY AS NEEDED FOR AN ANXIETY, DO NOT TAKE  AT NIGHT.  Marland Kitchen amiodarone (PACERONE) 200 MG tablet Take 2 tablets by mouth twice a day for 1 week, then take 1 tablet by mouth twice a day  . apixaban (ELIQUIS) 5 MG TABS tablet Take 1 tablet (5 mg total) by mouth 2 (two) times daily.  Marland Kitchen aspirin EC 81 MG EC tablet Take 1 tablet (81 mg total) by mouth daily.  . Calcium Carb-Cholecalciferol (CALCIUM 600+D3) 600-400 MG-UNIT TABS Take 1 tablet by mouth daily.  . Cholecalciferol (VITAMIN D-3) 1000 UNITS CAPS Take 1,000 Units by mouth daily.   . clindamycin (CLEOCIN) 300 MG capsule Take 600 mg by mouth once.   . Coenzyme Q10 (CO Q 10) 100 MG CAPS Take 100 mg by mouth daily.   . ferrous sulfate 325 (65 FE) MG tablet Take 325 mg by mouth every Monday, Wednesday, and Friday.  . fluticasone (FLONASE) 50 MCG/ACT nasal spray Place 2 sprays into both nostrils daily.  . furosemide (LASIX) 40 MG tablet Take 1 tablet (40 mg total) by mouth daily.  Marland Kitchen HYDROcodone-homatropine (HYCODAN) 5-1.5 MG/5ML syrup TAKE 5ML BY MOUTH EVERY 8 HOURS AS NEEDED FOR COUGH.  . levocetirizine (XYZAL) 5 MG tablet Take 5 mg by mouth at bedtime as needed for allergies.   . Magnesium 400 MG CAPS Take 400 mg by mouth daily.   . metoprolol tartrate (LOPRESSOR) 25 MG tablet Take 1 tablet (25 mg total) by mouth 2 (two) times daily.  . nitroGLYCERIN (NITROSTAT) 0.4 MG SL tablet Place 1 tablet (0.4 mg total) under the tongue every 5 (five) minutes as needed for chest pain (CP or SOB).  Marland Kitchen Ophthalmic Irrigation Solution (OCUSOFT EYE [place] OP) Apply 1 application to eye 2 (two) times daily as needed (dry eyes). Morning and before bed  . pantoprazole (PROTONIX) 40 MG tablet Take 1 tablet (40 mg total) by mouth 2 (two) times daily.  . rosuvastatin (CRESTOR) 20 MG tablet Take 1 tablet (20 mg total) by mouth daily.  Marland Kitchen tretinoin (RETIN-A) 0.05 % cream Apply 1 application topically 3 (three) times a week.   . Wheat Dextrin (BENEFIBER DRINK MIX PO) Take 10 mLs by mouth every morning. Mix with 1/2 a glass  water,  . [DISCONTINUED] Chloral Hydrate CRYS Take 10 mLs by mouth at bedtime as needed (Call  liquid into [place]).  . [DISCONTINUED] furosemide (LASIX) 20 MG tablet Take 1 tablet by mouth once a day for 3 days, then take 1 tablet by mouth three times a week on Monday/Wednesday/Friday  . [DISCONTINUED] furosemide (LASIX) 20 MG tablet Take 20 mg by mouth daily.  . [DISCONTINUED] losartan (COZAAR) 25 MG tablet Take 1 tablet (25 mg total) by mouth daily.     Allergies:   Lactose intolerance (gi), Amoxicillin-pot clavulanate, Penicillin g, Penicillins, Sulfa antibiotics, Sulfamethoxazole, and Sulfonamide derivatives   Social History   Socioeconomic History  . Marital status: Unknown    Spouse name: Not on file  . Number of children: 3  .  Years of education: 98  . Highest education level: Bachelor's degree (e.g., BA, AB, BS)  Occupational History  . Occupation: Retired  Tobacco Use  . Smoking status: Former Smoker    Packs/day: 0.50    Years: 15.00    Pack years: 7.50    Types: Cigarettes    Start date: 57    Quit date: 1986    Years since quitting: 35.6  . Smokeless tobacco: Never Used  . Tobacco comment: quit smoking 1980  Vaping Use  . Vaping Use: Never used  Substance and Sexual Activity  . Alcohol use: Yes    Alcohol/week: 7.0 standard drinks    Types: 7 Glasses of wine per week    Comment: wine nightly with dinner  . Drug use: No  . Sexual activity: Not Currently  Other Topics Concern  . Not on file  Social History Narrative   ** Merged History Encounter **       Lives at [facility] with her husband. Right-handed. Caffeine use: 3-4 cups per day (some tea, mixes coffee with decaf).   Social Determinants of Health   Financial Resource Strain:   . Difficulty of Paying Living Expenses: Not on file  Food Insecurity:   . Worried About Charity fundraiser in the Last Year: Not on file  . Ran Out of Food in the Last Year: Not on file    Transportation Needs:   . Lack of Transportation (Medical): Not on file  . Lack of Transportation (Non-Medical): Not on file  Physical Activity:   . Days of Exercise per Week: Not on file  . Minutes of Exercise per Session: Not on file  Stress:   . Feeling of Stress : Not on file  Social Connections:   . Frequency of Communication with Friends and Family: Not on file  . Frequency of Social Gatherings with Friends and Family: Not on file  . Attends Religious Services: Not on file  . Active Member of Clubs or Organizations: Not on file  . Attends Archivist Meetings: Not on file  . Marital Status: Not on file     Family History: The patient's family history includes Cancer (age of onset: 4) in her cousin; Colon cancer in her mother; Hypertension in her mother; Kidney disease in her father; Rectal cancer in her mother. There is no history of Esophageal cancer, Stomach cancer, or Colon polyps.  ROS:   Please see the history of present illness.    All other systems reviewed and are negative.  EKGs/Labs/Other Studies Reviewed:    The following studies were reviewed today: TEE: 1. Extensive 3 D rending done of AVR.  2. No LAA thormbus [place] x 1 200 J converted to NSR.  3. Left ventricular ejection fraction, by estimation, is 55%. The left  ventricle has normal function. The left ventricle has no regional wall  motion abnormalities. There is mild left ventricular hypertrophy. Left  ventricular diastolic function could not  be evaluated.  4. Right ventricular systolic function is moderately reduced. The right  ventricular size is moderately enlarged.  5. Left atrial size was severely dilated. No left atrial/left atrial  appendage thrombus was detected.  6. Right atrial size was severely dilated.  7. The mitral valve is normal in structure. Mild mitral valve  regurgitation.  8. Tricuspid valve regurgitation is moderate to severe.  9. Post AVR 2016 with 21 mm  [facility] Ease stented pericardial tissue  valve Mean gradient 8 mmHg Moderate  PVL 1:00 on BSA views may be related  to suture dehiscence as she had a bicuspid AV prior to implant . The  aortic valve has been  repaired/replaced. Aortic valve regurgitation is moderate.   EKG:  EKG is ordered today.  The ekg ordered today demonstrates atrial fibrillation, HR 94 bpm, nonspecific ST abnormality  Recent Labs: 06/20/2019: ALT 17 12/06/2019: Hemoglobin 13.6; Platelets 176; TSH 1.330 12/19/2019: BUN 16; Creatinine, Ser 0.95; Potassium 4.5; Sodium 139  Recent Lipid Panel    Component Value Date/Time   CHOL 230 (H) 06/20/2019 0929   TRIG 73.0 06/20/2019 0929   HDL 94.90 06/20/2019 0929   CHOLHDL 2 06/20/2019 0929   VLDL 14.6 06/20/2019 0929   LDLCALC 120 (H) 06/20/2019 0929   LDLDIRECT 144.0 11/29/2012 1601    Physical Exam:    VS:  BP 128/76   Pulse 94   Ht 5\' 5"  (1.651 m)   Wt 170 lb (77.1 kg)   SpO2 97%   BMI 28.29 kg/m     Wt Readings from Last 3 Encounters:  12/23/19 171 lb (77.6 kg)  12/23/19 170 lb (77.1 kg)  12/12/19 168 lb (76.2 kg)     GEN:  Well nourished, well developed in no acute distress HEENT: Normal NECK: No JVD; No carotid bruits LYMPHATICS: No lymphadenopathy CARDIAC: irregularly irregular, 2/6 SEM at the RUSB and 2/6 diastolic decrescendo murmur at the LLSB RESPIRATORY:  Clear to auscultation without rales, wheezing or rhonchi  ABDOMEN: Soft, non-tender, non-distended MUSCULOSKELETAL:  1+ pedal edema bilaterally with stasis changes; No deformity  SKIN: Warm and dry NEUROLOGIC:  Alert and oriented x 3 PSYCHIATRIC:  Normal affect   ASSESSMENT:    1. Paroxysmal atrial fibrillation (HCC)   2. Paravalvular leak of prosthetic heart valve, subsequent encounter   3. Chronic diastolic heart failure (HCC)    PLAN:    In order of problems listed above:  1. Recurrent AF post-cardioversion, now on oral amiodarone loading phase (completed 400 mg BID x 1  week, now 200 mg BID). She has been consistently on apixaban without missed doses and had TEE at her initial cardioversion without LAA thrombus. She remains symptomatic with NYHA functional class II symptoms of acute on chronic diastolic HF. I have recommended repeat cardioversion now that she has been loaded with amiodarone. Reviewed rationale, potential benefit, risks, and alternatives with the patient, her husband, and her daughter via phone conference. They understand and agree to proceed. 2. Acute on chronic diastolic HF: suspect this is driven by loss of AV synchrony in AF, chronic LVH, aortic valve disease with moderate AI. Recommend increase lasix to 40 mg daily, hold losartan to allow for BP to tolerate increased diuresis. Recent TEE reviewed. 3. HTN: BP controlled. See med changes outlined above.  4. Moderate paravalvular AI: continue to follow. Normal pulse pressure noted. LV size normal.    Medication Adjustments/Labs and Tests Ordered: Current medicines are reviewed at length with the patient today.  Concerns regarding medicines are outlined above.  Orders Placed This Encounter  Procedures  . EKG 12-Lead   Meds ordered this encounter  Medications  . furosemide (LASIX) 40 MG tablet    Sig: Take 1 tablet (40 mg total) by mouth daily.    Dispense:  90 tablet    Refill:  3  . potassium chloride (KLOR-CON) 10 MEQ tablet    Sig: Take 1 tablet (10 mEq total) by mouth daily.    Dispense:  90 tablet    Refill:  3    Patient Instructions  Medication Instructions:  1) INCREASE LASIX to 40 mg daily 2) START KDUR (potassium supplement) 10 meq daily 3) STOP LOSARTAN  *If you need a refill on your cardiac medications before your next appointment, please call your pharmacy*  Testing/Procedures: Dr. Burt Knack recommends you have a CARDIOVERSION.  Follow-up: Keep your appointment with Dr. Burt Knack 10/13.    Signed, [name], MD  12/25/2019 7:45 PM    [facility]  [facility]

## 2019-12-23 NOTE — Assessment & Plan Note (Signed)
Chronic, intermittent Uses cough syrup only as needed, which is not often Okay to continue

## 2019-12-23 NOTE — Patient Instructions (Signed)
Medication Instructions:  1) INCREASE LASIX to 40 mg daily 2) START KDUR (potassium supplement) 10 meq daily 3) STOP LOSARTAN  *If you need a refill on your cardiac medications before your next appointment, please call your pharmacy*  Testing/Procedures: Dr. Burt Knack recommends you have a CARDIOVERSION.  Follow-up: Keep your appointment with Dr. Burt Knack 10/13.

## 2019-12-23 NOTE — Assessment & Plan Note (Signed)
New Following with cardiology Had cardioversion x1, but this helped transiently and is set up for cardioversion on 9/3 On amiodarone, Eliquis and metoprolol

## 2019-12-23 NOTE — H&P (View-Only) (Signed)
Cardiology Office Note:    Date:  12/25/2019   ID:  [name], DOB 01-Mar-1933, MRN [id number]  PCP:  [name], MD  [place] Cardiologist:  [name], MD  [place] Electrophysiologist:  None   Referring MD: [name], MD   No chief complaint on file.   History of Present Illness:    [name] is a 86 y.o. female with a hx of:  Persistent atrial fibrillation  ? S/p TEE-DCCV 11/2019 ? CHA2DS2-VASc=6 (age x 2, female, CAD, CHF, HTN) >> Apixaban    Bicuspid valve, aortic stenosis ? S/p bioprosthetic AVR in 2016 ? Paravalvular leak ? Normal coronary arteries prior to AVR  Coronary artery disease ? S/p NSTEMI in 01/2019 >> total occlusion of distal LAD felt to be prob embolic event ? Ischemic CM, EF 45-50 w ant-apical AK ? TEE 8/21: EF 55  Breast CA  Hypertension   Hyperlipidemia   Diastolic CHF   The patient developed new onset afib diagnosed 12-06-2019. She underwent TEE-guided cardioversion. She has had early recurrence of atrial fibrillation and was started on amiodarone by Richardson Dopp with plans for early follow-up to arrange repeat DCCV if still in atrial fibrillation. She presents today for that follow-up. She is here with her husband today. She has been short of breath with activity. When she walks up a slight grade, she is short of breath with a low level of activity. She does not have perception of atrial fibrillation. She denies chest pain. She complains of poor energy. Her daughter is conferenced in from [place] during the visit today.   Past Medical History:  Diagnosis Date  . Allergy   . Anemia   . Anxiety   . Aortic valve stenosis, severe   . Breast cancer ([place]) 2007   right  . Cataract   . Hiatal hernia   . Hypercholesteremia   . Hypoglycemia   . Osteopenia   . Personal history of radiation therapy   . Tingling   . Ulcer 2012   bleeding gastric Ulcer    Past Surgical History:  Procedure  Laterality Date  . AORTIC VALVE REPLACEMENT N/A 11/16/2014   Procedure: AORTIC VALVE REPLACEMENT (AVR);  Surgeon: Gaye Pollack, MD;  Location: [place];  Service: Open Heart Surgery;  Laterality: N/A;  . BIOPSY  12/15/2017   Procedure: BIOPSY;  Surgeon: Rush Landmark Telford Nab., MD;  Location: [place];  Service: Gastroenterology;;  . BREAST BIOPSY    . BREAST LUMPECTOMY Right    2005?  Marland Kitchen BREAST SURGERY    . CARDIAC CATHETERIZATION N/A 10/18/2014   Procedure: Right/Left Heart Cath and Coronary Angiography;  Surgeon: [name], MD;  Location: [place] CV LAB;  Service: Cardiovascular;  Laterality: N/A;  . CARDIOVERSION N/A 12/09/2019   Procedure: CARDIOVERSION;  Surgeon: Josue Hector, MD;  Location: [facility] ENDOSCOPY;  Service: Cardiovascular;  Laterality: N/A;  . CORONARY ANGIOGRAPHY N/A 02/04/2019   Procedure: CORONARY ANGIOGRAPHY;  Surgeon: Nelva Bush, MD;  Location: [place] CV LAB;  Service: Cardiovascular;  Laterality: N/A;  . COSMETIC SURGERY     face  . ESOPHAGOGASTRODUODENOSCOPY (EGD) WITH PROPOFOL N/A 12/15/2017   Procedure: ESOPHAGOGASTRODUODENOSCOPY (EGD) WITH PROPOFOL;  Surgeon: Rush Landmark Telford Nab., MD;  Location: [place];  Service: Gastroenterology;  Laterality: N/A;  . PARATHYROIDECTOMY    . TEE WITHOUT CARDIOVERSION N/A 11/16/2014   Procedure: TRANSESOPHAGEAL ECHOCARDIOGRAM (TEE);  Surgeon: Gaye Pollack, MD;  Location: [place];  Service: Open Heart Surgery;  Laterality: N/A;  . TEE WITHOUT CARDIOVERSION N/A 12/09/2019   Procedure: TRANSESOPHAGEAL ECHOCARDIOGRAM (TEE);  Surgeon: Josue Hector, MD;  Location: [facility] ENDOSCOPY;  Service: Cardiovascular;  Laterality: N/A;  . TONSILLECTOMY      Current Medications: Current Meds  Medication Sig  . acetaminophen (TYLENOL) 500 MG tablet Take 500-1,000 mg by mouth at bedtime as needed for mild pain or moderate pain.  Marland Kitchen ALPRAZolam (XANAX) 0.5 MG tablet TAKE 1 TABLET ONCE DAILY AS NEEDED FOR AN ANXIETY, DO NOT TAKE  AT NIGHT.  Marland Kitchen amiodarone (PACERONE) 200 MG tablet Take 2 tablets by mouth twice a day for 1 week, then take 1 tablet by mouth twice a day  . apixaban (ELIQUIS) 5 MG TABS tablet Take 1 tablet (5 mg total) by mouth 2 (two) times daily.  Marland Kitchen aspirin EC 81 MG EC tablet Take 1 tablet (81 mg total) by mouth daily.  . Calcium Carb-Cholecalciferol (CALCIUM 600+D3) 600-400 MG-UNIT TABS Take 1 tablet by mouth daily.  . Cholecalciferol (VITAMIN D-3) 1000 UNITS CAPS Take 1,000 Units by mouth daily.   . clindamycin (CLEOCIN) 300 MG capsule Take 600 mg by mouth once.   . Coenzyme Q10 (CO Q 10) 100 MG CAPS Take 100 mg by mouth daily.   . ferrous sulfate 325 (65 FE) MG tablet Take 325 mg by mouth every Monday, Wednesday, and Friday.  . fluticasone (FLONASE) 50 MCG/ACT nasal spray Place 2 sprays into both nostrils daily.  . furosemide (LASIX) 40 MG tablet Take 1 tablet (40 mg total) by mouth daily.  Marland Kitchen HYDROcodone-homatropine (HYCODAN) 5-1.5 MG/5ML syrup TAKE 5ML BY MOUTH EVERY 8 HOURS AS NEEDED FOR COUGH.  . levocetirizine (XYZAL) 5 MG tablet Take 5 mg by mouth at bedtime as needed for allergies.   . Magnesium 400 MG CAPS Take 400 mg by mouth daily.   . metoprolol tartrate (LOPRESSOR) 25 MG tablet Take 1 tablet (25 mg total) by mouth 2 (two) times daily.  . nitroGLYCERIN (NITROSTAT) 0.4 MG SL tablet Place 1 tablet (0.4 mg total) under the tongue every 5 (five) minutes as needed for chest pain (CP or SOB).  Marland Kitchen Ophthalmic Irrigation Solution (OCUSOFT EYE [place] OP) Apply 1 application to eye 2 (two) times daily as needed (dry eyes). Morning and before bed  . pantoprazole (PROTONIX) 40 MG tablet Take 1 tablet (40 mg total) by mouth 2 (two) times daily.  . rosuvastatin (CRESTOR) 20 MG tablet Take 1 tablet (20 mg total) by mouth daily.  Marland Kitchen tretinoin (RETIN-A) 0.05 % cream Apply 1 application topically 3 (three) times a week.   . Wheat Dextrin (BENEFIBER DRINK MIX PO) Take 10 mLs by mouth every morning. Mix with 1/2 a glass  water,  . [DISCONTINUED] Chloral Hydrate CRYS Take 10 mLs by mouth at bedtime as needed (Call  liquid into [facility]).  . [DISCONTINUED] furosemide (LASIX) 20 MG tablet Take 1 tablet by mouth once a day for 3 days, then take 1 tablet by mouth three times a week on Monday/Wednesday/Friday  . [DISCONTINUED] furosemide (LASIX) 20 MG tablet Take 20 mg by mouth daily.  . [DISCONTINUED] losartan (COZAAR) 25 MG tablet Take 1 tablet (25 mg total) by mouth daily.     Allergies:   Lactose intolerance (gi), Amoxicillin-pot clavulanate, Penicillin g, Penicillins, Sulfa antibiotics, Sulfamethoxazole, and Sulfonamide derivatives   Social History   Socioeconomic History  . Marital status: Unknown    Spouse name: Not on file  . Number of children: 3  .  Years of education: 74  . Highest education level: Bachelor's degree (e.g., BA, AB, BS)  Occupational History  . Occupation: Retired  Tobacco Use  . Smoking status: Former Smoker    Packs/day: 0.50    Years: 15.00    Pack years: 7.50    Types: Cigarettes    Start date: 62    Quit date: 1986    Years since quitting: 35.6  . Smokeless tobacco: Never Used  . Tobacco comment: quit smoking 1980  Vaping Use  . Vaping Use: Never used  Substance and Sexual Activity  . Alcohol use: Yes    Alcohol/week: 7.0 standard drinks    Types: 7 Glasses of wine per week    Comment: wine nightly with dinner  . Drug use: No  . Sexual activity: Not Currently  Other Topics Concern  . Not on file  Social History Narrative   ** Merged History Encounter **       Lives at [facility] with her husband. Right-handed. Caffeine use: 3-4 cups per day (some tea, mixes coffee with decaf).   Social Determinants of Health   Financial Resource Strain:   . Difficulty of Paying Living Expenses: Not on file  Food Insecurity:   . Worried About Charity fundraiser in the Last Year: Not on file  . Ran Out of Food in the Last Year: Not on file    Transportation Needs:   . Lack of Transportation (Medical): Not on file  . Lack of Transportation (Non-Medical): Not on file  Physical Activity:   . Days of Exercise per Week: Not on file  . Minutes of Exercise per Session: Not on file  Stress:   . Feeling of Stress : Not on file  Social Connections:   . Frequency of Communication with Friends and Family: Not on file  . Frequency of Social Gatherings with Friends and Family: Not on file  . Attends Religious Services: Not on file  . Active Member of Clubs or Organizations: Not on file  . Attends Archivist Meetings: Not on file  . Marital Status: Not on file     Family History: The patient's family history includes Cancer (age of onset: 68) in her cousin; Colon cancer in her mother; Hypertension in her mother; Kidney disease in her father; Rectal cancer in her mother. There is no history of Esophageal cancer, Stomach cancer, or Colon polyps.  ROS:   Please see the history of present illness.    All other systems reviewed and are negative.  EKGs/Labs/Other Studies Reviewed:    The following studies were reviewed today: TEE: 1. Extensive 3 D rending done of AVR.  2. No LAA thormbus [place] x 1 200 J converted to NSR.  3. Left ventricular ejection fraction, by estimation, is 55%. The left  ventricle has normal function. The left ventricle has no regional wall  motion abnormalities. There is mild left ventricular hypertrophy. Left  ventricular diastolic function could not  be evaluated.  4. Right ventricular systolic function is moderately reduced. The right  ventricular size is moderately enlarged.  5. Left atrial size was severely dilated. No left atrial/left atrial  appendage thrombus was detected.  6. Right atrial size was severely dilated.  7. The mitral valve is normal in structure. Mild mitral valve  regurgitation.  8. Tricuspid valve regurgitation is moderate to severe.  9. Post AVR 2016 with 21 mm  [facility] Ease stented pericardial tissue  valve Mean gradient 8 mmHg Moderate  PVL 1:00 on BSA views may be related  to suture dehiscence as she had a bicuspid AV prior to implant . The  aortic valve has been  repaired/replaced. Aortic valve regurgitation is moderate.   EKG:  EKG is ordered today.  The ekg ordered today demonstrates atrial fibrillation, HR 94 bpm, nonspecific ST abnormality  Recent Labs: 06/20/2019: ALT 17 12/06/2019: Hemoglobin 13.6; Platelets 176; TSH 1.330 12/19/2019: BUN 16; Creatinine, Ser 0.95; Potassium 4.5; Sodium 139  Recent Lipid Panel    Component Value Date/Time   CHOL 230 (H) 06/20/2019 0929   TRIG 73.0 06/20/2019 0929   HDL 94.90 06/20/2019 0929   CHOLHDL 2 06/20/2019 0929   VLDL 14.6 06/20/2019 0929   LDLCALC 120 (H) 06/20/2019 0929   LDLDIRECT 144.0 11/29/2012 1601    Physical Exam:    VS:  BP 128/76   Pulse 94   Ht 5\' 5"  (1.651 m)   Wt 170 lb (77.1 kg)   SpO2 97%   BMI 28.29 kg/m     Wt Readings from Last 3 Encounters:  12/23/19 171 lb (77.6 kg)  12/23/19 170 lb (77.1 kg)  12/12/19 168 lb (76.2 kg)     GEN:  Well nourished, well developed in no acute distress HEENT: Normal NECK: No JVD; No carotid bruits LYMPHATICS: No lymphadenopathy CARDIAC: irregularly irregular, 2/6 SEM at the RUSB and 2/6 diastolic decrescendo murmur at the LLSB RESPIRATORY:  Clear to auscultation without rales, wheezing or rhonchi  ABDOMEN: Soft, non-tender, non-distended MUSCULOSKELETAL:  1+ pedal edema bilaterally with stasis changes; No deformity  SKIN: Warm and dry NEUROLOGIC:  Alert and oriented x 3 PSYCHIATRIC:  Normal affect   ASSESSMENT:    1. Paroxysmal atrial fibrillation (HCC)   2. Paravalvular leak of prosthetic heart valve, subsequent encounter   3. Chronic diastolic heart failure (HCC)    PLAN:    In order of problems listed above:  1. Recurrent AF post-cardioversion, now on oral amiodarone loading phase (completed 400 mg BID x 1  week, now 200 mg BID). She has been consistently on apixaban without missed doses and had TEE at her initial cardioversion without LAA thrombus. She remains symptomatic with NYHA functional class II symptoms of acute on chronic diastolic HF. I have recommended repeat cardioversion now that she has been loaded with amiodarone. Reviewed rationale, potential benefit, risks, and alternatives with the patient, her husband, and her daughter via phone conference. They understand and agree to proceed. 2. Acute on chronic diastolic HF: suspect this is driven by loss of AV synchrony in AF, chronic LVH, aortic valve disease with moderate AI. Recommend increase lasix to 40 mg daily, hold losartan to allow for BP to tolerate increased diuresis. Recent TEE reviewed. 3. HTN: BP controlled. See med changes outlined above.  4. Moderate paravalvular AI: continue to follow. Normal pulse pressure noted. LV size normal.    Medication Adjustments/Labs and Tests Ordered: Current medicines are reviewed at length with the patient today.  Concerns regarding medicines are outlined above.  Orders Placed This Encounter  Procedures  . EKG 12-Lead   Meds ordered this encounter  Medications  . furosemide (LASIX) 40 MG tablet    Sig: Take 1 tablet (40 mg total) by mouth daily.    Dispense:  90 tablet    Refill:  3  . potassium chloride (KLOR-CON) 10 MEQ tablet    Sig: Take 1 tablet (10 mEq total) by mouth daily.    Dispense:  90 tablet    Refill:  3    Patient Instructions  Medication Instructions:  1) INCREASE LASIX to 40 mg daily 2) START KDUR (potassium supplement) 10 meq daily 3) STOP LOSARTAN  *If you need a refill on your cardiac medications before your next appointment, please call your pharmacy*  Testing/Procedures: Dr. Burt Knack recommends you have a CARDIOVERSION.  Follow-up: Keep your appointment with Dr. Burt Knack 10/13.    Signed, [name], MD  12/25/2019 7:45 PM    [facility]

## 2019-12-23 NOTE — Assessment & Plan Note (Signed)
Chronic Lipids well controlled Continue statin

## 2019-12-23 NOTE — Assessment & Plan Note (Signed)
Chronic BP well controlled Current regimen effective and well tolerated Continue current medications at current doses  

## 2019-12-23 NOTE — Assessment & Plan Note (Signed)
Chronic Take Xanax as needed only Continue

## 2019-12-23 NOTE — Assessment & Plan Note (Signed)
Chronic Last A1c 5.8% She does not need other blood work so we will hold off on checking this until her next visit

## 2019-12-23 NOTE — Progress Notes (Signed)
DCCV scheduled 9/3 with Dr. Johnsie Cancel Case ID # [id number]

## 2019-12-26 ENCOUNTER — Telehealth: Payer: Self-pay | Admitting: Internal Medicine

## 2019-12-26 ENCOUNTER — Other Ambulatory Visit: Payer: Self-pay | Admitting: Cardiovascular Disease

## 2019-12-26 NOTE — Telephone Encounter (Signed)
Called pharmacy had to leave msg on vm to RTC. aslo called pt there was no answer LMOM RTC...[name]

## 2019-12-26 NOTE — Telephone Encounter (Signed)
Please call patient please call patient.  She did mention that she had used a little extra recently, but I did not check while she was here and did not realize she has a full month early.   We will go ahead and ask the pharmacy to fill early.   Please let her know she cannot use this much especially with the other medications she is on.   Please call pharmacy and asked them to fill early this month only.  Thank you.

## 2019-12-26 NOTE — Telephone Encounter (Signed)
    Pharmacy calling to clarify Chloral Hydrate CRYS start date

## 2019-12-27 ENCOUNTER — Ambulatory Visit: Payer: Medicare Other | Admitting: Physician Assistant

## 2019-12-27 NOTE — Telephone Encounter (Signed)
Jessice gave MD response concerning early refill on the Choral Hydrate [place].../lmb

## 2019-12-27 NOTE — Telephone Encounter (Signed)
  Follow up message  Please return call to patient 

## 2019-12-27 NOTE — Telephone Encounter (Signed)
New message:   [name] is returning a call she states to call [phone] ext [phone]. Please advise.

## 2019-12-28 ENCOUNTER — Other Ambulatory Visit (HOSPITAL_COMMUNITY)
Admission: RE | Admit: 2019-12-28 | Discharge: 2019-12-28 | Disposition: A | Discharge status: Home / Self Care | Payer: Medicare Other | Source: Ambulatory Visit | Attending: Cardiovascular Disease | Admitting: Cardiovascular Disease

## 2019-12-28 DIAGNOSIS — Z01812 Encounter for preprocedural laboratory examination: Secondary | ICD-10-CM | POA: Insufficient documentation

## 2019-12-28 DIAGNOSIS — Z20822 Contact with and (suspected) exposure to covid-19: Secondary | ICD-10-CM | POA: Insufficient documentation

## 2019-12-28 DIAGNOSIS — N760 Acute vaginitis: Secondary | ICD-10-CM | POA: Diagnosis not present

## 2019-12-28 DIAGNOSIS — B373 Candidiasis of vulva and vagina: Secondary | ICD-10-CM | POA: Diagnosis not present

## 2019-12-28 LAB — SARS CORONAVIRUS 2 (TAT 6-24 HRS): SARS Coronavirus 2: NEGATIVE

## 2019-12-30 ENCOUNTER — Ambulatory Visit (HOSPITAL_COMMUNITY)
Admission: RE | Admit: 2019-12-30 | Discharge: 2019-12-30 | Disposition: A | Discharge status: Home / Self Care | Payer: Medicare Other | Attending: Cardiovascular Disease | Admitting: Cardiovascular Disease

## 2019-12-30 ENCOUNTER — Other Ambulatory Visit: Payer: Self-pay

## 2019-12-30 ENCOUNTER — Ambulatory Visit (HOSPITAL_COMMUNITY): Payer: Medicare Other | Admitting: Anesthesiology

## 2019-12-30 ENCOUNTER — Encounter (HOSPITAL_COMMUNITY): Payer: Self-pay | Admitting: Cardiovascular Disease

## 2019-12-30 ENCOUNTER — Encounter (HOSPITAL_COMMUNITY)
Admission: RE | Disposition: A | Discharge status: Home / Self Care | Payer: Self-pay | Source: Home / Self Care | Attending: Cardiovascular Disease

## 2019-12-30 DIAGNOSIS — I5032 Chronic diastolic (congestive) heart failure: Secondary | ICD-10-CM | POA: Insufficient documentation

## 2019-12-30 DIAGNOSIS — Z7901 Long term (current) use of anticoagulants: Secondary | ICD-10-CM | POA: Diagnosis not present

## 2019-12-30 DIAGNOSIS — Z87891 Personal history of nicotine dependence: Secondary | ICD-10-CM | POA: Diagnosis not present

## 2019-12-30 DIAGNOSIS — I2510 Atherosclerotic heart disease of native coronary artery without angina pectoris: Secondary | ICD-10-CM | POA: Insufficient documentation

## 2019-12-30 DIAGNOSIS — I4819 Other persistent atrial fibrillation: Secondary | ICD-10-CM | POA: Insufficient documentation

## 2019-12-30 DIAGNOSIS — Z923 Personal history of irradiation: Secondary | ICD-10-CM | POA: Insufficient documentation

## 2019-12-30 DIAGNOSIS — I10 Essential (primary) hypertension: Secondary | ICD-10-CM | POA: Diagnosis not present

## 2019-12-30 DIAGNOSIS — I350 Nonrheumatic aortic (valve) stenosis: Secondary | ICD-10-CM | POA: Insufficient documentation

## 2019-12-30 DIAGNOSIS — I252 Old myocardial infarction: Secondary | ICD-10-CM | POA: Insufficient documentation

## 2019-12-30 DIAGNOSIS — Z853 Personal history of malignant neoplasm of breast: Secondary | ICD-10-CM | POA: Insufficient documentation

## 2019-12-30 DIAGNOSIS — Z79899 Other long term (current) drug therapy: Secondary | ICD-10-CM | POA: Insufficient documentation

## 2019-12-30 DIAGNOSIS — I110 Hypertensive heart disease with heart failure: Secondary | ICD-10-CM | POA: Diagnosis not present

## 2019-12-30 DIAGNOSIS — F419 Anxiety disorder, unspecified: Secondary | ICD-10-CM | POA: Diagnosis not present

## 2019-12-30 DIAGNOSIS — Z7982 Long term (current) use of aspirin: Secondary | ICD-10-CM | POA: Diagnosis not present

## 2019-12-30 DIAGNOSIS — I255 Ischemic cardiomyopathy: Secondary | ICD-10-CM | POA: Diagnosis not present

## 2019-12-30 DIAGNOSIS — D649 Anemia, unspecified: Secondary | ICD-10-CM | POA: Insufficient documentation

## 2019-12-30 DIAGNOSIS — E785 Hyperlipidemia, unspecified: Secondary | ICD-10-CM | POA: Insufficient documentation

## 2019-12-30 DIAGNOSIS — E7800 Pure hypercholesterolemia, unspecified: Secondary | ICD-10-CM | POA: Diagnosis not present

## 2019-12-30 DIAGNOSIS — Z952 Presence of prosthetic heart valve: Secondary | ICD-10-CM | POA: Diagnosis not present

## 2019-12-30 DIAGNOSIS — I4891 Unspecified atrial fibrillation: Secondary | ICD-10-CM

## 2019-12-30 HISTORY — PX: CARDIOVERSION: SHX1299

## 2019-12-30 LAB — POCT I-STAT, CHEM 8
BUN: 25 mg/dL — ABNORMAL HIGH (ref 8–23)
Calcium, Ion: 1.11 mmol/L — ABNORMAL LOW (ref 1.15–1.40)
Chloride: 102 mmol/L (ref 98–111)
Creatinine, Ser: 1 mg/dL (ref 0.44–1.00)
Glucose, Bld: 91 mg/dL (ref 70–99)
HCT: 45 % (ref 36.0–46.0)
Hemoglobin: 15.3 g/dL — ABNORMAL HIGH (ref 12.0–15.0)
Potassium: 4.8 mmol/L (ref 3.5–5.1)
Sodium: 138 mmol/L (ref 135–145)
TCO2: 30 mmol/L (ref 22–32)

## 2019-12-30 SURGERY — CARDIOVERSION
Anesthesia: General

## 2019-12-30 MED ORDER — LIDOCAINE 2% (20 MG/ML) 5 ML SYRINGE
INTRAMUSCULAR | Status: DC | PRN
  Administered 2019-12-30: 80 mg via INTRAVENOUS

## 2019-12-30 MED ORDER — SODIUM CHLORIDE 0.9 % IV SOLN: INTRAVENOUS | Status: DC

## 2019-12-30 MED ORDER — METOPROLOL TARTRATE 25 MG PO TABS: 25.0000 mg | ORAL_TABLET | Freq: Every day | ORAL | 6 refills | Status: DC

## 2019-12-30 MED ORDER — EPHEDRINE SULFATE-NACL 50-0.9 MG/10ML-% IV SOSY
PREFILLED_SYRINGE | INTRAVENOUS | Status: DC | PRN
  Administered 2019-12-30 (×2): 5 mg via INTRAVENOUS

## 2019-12-30 MED ORDER — PROPOFOL 10 MG/ML IV BOLUS
INTRAVENOUS | Status: DC | PRN
  Administered 2019-12-30: 60 mg via INTRAVENOUS

## 2019-12-30 NOTE — Anesthesia Preprocedure Evaluation (Signed)
Anesthesia Evaluation  Patient identified by MRN, date of birth, ID band Patient awake    Reviewed: Allergy & Precautions, NPO status , Patient's Chart, lab work & pertinent test results  Airway Mallampati: II  TM Distance: >3 FB     Dental  (+) Teeth Intact, Dental Advisory Given   Pulmonary former smoker,    breath sounds clear to auscultation       Cardiovascular hypertension, + CAD  + Valvular Problems/Murmurs (s/p AVR in 2016) AS  Rhythm:Irregular Rate:Normal   1. Extensive 3 D rending done of AVR.  2. No LAA thormbus [place] x 1 200 J converted to NSR.  3. Left ventricular ejection fraction, by estimation, is 55%. The left  ventricle has normal function. The left ventricle has no regional wall  motion abnormalities. There is mild left ventricular hypertrophy. Left  ventricular diastolic function could not  be evaluated.  4. Right ventricular systolic function is moderately reduced. The right  ventricular size is moderately enlarged.  5. Left atrial size was severely dilated. No left atrial/left atrial  appendage thrombus was detected.  6. Right atrial size was severely dilated.  7. The mitral valve is normal in structure. Mild mitral valve  regurgitation.  8. Tricuspid valve regurgitation is moderate to severe.  9. Post AVR 2016 with 21 mm [facility] 352 Ease stented pericardial tissue  valve Mean gradient 8 mmHg Moderate PVL 1:00 on BSA views may be related  to suture dehiscence as she had a bicuspid AV prior to implant . The  aortic valve has been  repaired/replaced. Aortic valve regurgitation is moderate.    Neuro/Psych    GI/Hepatic Neg liver ROS, PUD,   Endo/Other  negative endocrine ROS  Renal/GU negative Renal ROS  negative genitourinary   Musculoskeletal   Abdominal   Peds  Hematology  (+) anemia ,   Anesthesia Other Findings   Reproductive/Obstetrics negative OB ROS                              Anesthesia Physical  Anesthesia Plan  ASA: III  Anesthesia Plan: General   Post-op Pain Management:    Induction: Intravenous  PONV Risk Score and Plan: 3 and TIVA, Propofol infusion and Treatment may vary due to age or medical condition  Airway Management Planned: Mask  Additional Equipment:   Intra-op Plan:   Post-operative Plan:   Informed Consent: I have reviewed the patients History and Physical, chart, labs and discussed the procedure including the risks, benefits and alternatives for the proposed anesthesia with the patient or authorized representative who has indicated his/her understanding and acceptance.       Plan Discussed with: Anesthesiologist and CRNA  Anesthesia Plan Comments:         Anesthesia Quick Evaluation

## 2019-12-30 NOTE — Anesthesia Procedure Notes (Addendum)
Procedure Name: General with mask airway Date/Time: 12/30/2019 10:53 AM Performed by: Moshe Salisbury, CRNA Pre-anesthesia Checklist: Patient identified, Emergency Drugs available, Suction available, Patient being monitored and Timeout performed Patient Re-evaluated:Patient Re-evaluated prior to induction Oxygen Delivery Method: Nasal cannula and Ambu bag Placement Confirmation: positive ETCO2 Dental Injury: Teeth and Oropharynx as per pre-operative assessment

## 2019-12-30 NOTE — Interval H&P Note (Signed)
History and Physical Interval Note:  12/30/2019 10:07 AM  [name]  has presented today for surgery, with the diagnosis of AFIB.  The various methods of treatment have been discussed with the patient and family. After consideration of risks, benefits and other options for treatment, the patient has consented to  Procedure(s): CARDIOVERSION (N/A) as a surgical intervention.  The patient's history has been reviewed, patient examined, no change in status, stable for surgery.  I have reviewed the patient's chart and labs.  Questions were answered to the patient's satisfaction.     [name] Rouge

## 2019-12-30 NOTE — Discharge Instructions (Signed)
Do not take lopressor today Take 25 mg just daily unitil f/u with Dr Burt Knack starting tomorrow

## 2019-12-30 NOTE — Transfer of Care (Signed)
Immediate Anesthesia Transfer of Care Note  Patient: [name]  Procedure(s) Performed: CARDIOVERSION (N/A )  Patient Location: Endoscopy Unit  Anesthesia Type:General  Level of Consciousness: drowsy and responds to stimulation  Airway & Oxygen Therapy: Patient Spontanous Breathing and Patient connected to nasal cannula oxygen  Post-op Assessment: Report given to RN and Post -op Vital signs reviewed and stable  Post vital signs: Reviewed and stable  Last Vitals:  Vitals Value Taken Time  BP 101/55 12/30/19 1114  Temp 36.9 C 12/30/19 1114  Pulse 35 12/30/19 1114  Resp 12 12/30/19 1114  SpO2 98 % 12/30/19 1114    Last Pain:  Vitals:   12/30/19 1114  TempSrc: Oral  PainSc:          Complications: No complications documented.

## 2019-12-30 NOTE — CV Procedure (Signed)
DCC: On Rx Eliquis no missed doses Anesthesia:  Propofol  DCC x 1 150 J  Converted from afib rate of 100 to NSR rate 48 bpm No heart block just sinus bradycardia  No immediate neurologic sequelae  [name] Rouge MD Leonidas Romberg

## 2019-12-30 NOTE — Anesthesia Postprocedure Evaluation (Signed)
Anesthesia Post Note  Patient: [name]  Procedure(s) Performed: CARDIOVERSION (N/A )     Patient location during evaluation: PACU Anesthesia Type: General Level of consciousness: awake Pain management: pain level controlled Vital Signs Assessment: post-procedure vital signs reviewed and stable Respiratory status: spontaneous breathing and respiratory function stable Cardiovascular status: stable Postop Assessment: no apparent nausea or vomiting Anesthetic complications: no   No complications documented.  Last Vitals:  Vitals:   12/30/19 1146 12/30/19 1149  BP: (!) 110/50 (!) 108/44  Pulse: (!) 41 (!) 41  Resp: 15 15  Temp:    SpO2: 98% 98%    Last Pain:  Vitals:   12/30/19 1149  TempSrc:   PainSc: 0-No pain                 Merlinda Frederick

## 2020-01-01 ENCOUNTER — Encounter (HOSPITAL_COMMUNITY): Payer: Self-pay | Admitting: Cardiovascular Disease

## 2020-01-03 NOTE — Progress Notes (Signed)
Attempted, unable to leave message/bt 

## 2020-01-06 ENCOUNTER — Ambulatory Visit
Admission: RE | Admit: 2020-01-06 | Discharge: 2020-01-06 | Disposition: A | Discharge status: Home / Self Care | Payer: Medicare Other | Source: Ambulatory Visit | Attending: Internal Medicine | Admitting: Internal Medicine

## 2020-01-06 ENCOUNTER — Other Ambulatory Visit: Payer: Self-pay | Admitting: Internal Medicine

## 2020-01-06 DIAGNOSIS — I700 Atherosclerosis of aorta: Secondary | ICD-10-CM | POA: Diagnosis not present

## 2020-01-06 DIAGNOSIS — Z952 Presence of prosthetic heart valve: Secondary | ICD-10-CM | POA: Diagnosis not present

## 2020-01-06 DIAGNOSIS — R918 Other nonspecific abnormal finding of lung field: Secondary | ICD-10-CM

## 2020-01-06 DIAGNOSIS — J90 Pleural effusion, not elsewhere classified: Secondary | ICD-10-CM | POA: Diagnosis not present

## 2020-01-06 DIAGNOSIS — R911 Solitary pulmonary nodule: Secondary | ICD-10-CM | POA: Diagnosis not present

## 2020-01-09 ENCOUNTER — Telehealth: Payer: Self-pay

## 2020-01-09 NOTE — Telephone Encounter (Signed)
Ms. Kulaga is upset because she sent several MyChart messages last week and no one responded. Reiterated to her that the last message received was 9/5. Instructed her to look at "sent messages" once she writes one to make sure they go through.  Spoke to the patient at great length about her medications and symptoms. She has several concerns: 1) her leg pain 2) if she should establish in the Afib Clinic for closer follow-up 3) how much amiodarone she should be taking  Reviewed her medications in detail. Cardiac meds she is currently taking: Eliquis 5 mg BID Lasix 40 mg daily ASA 81 mg daily Amiodarone 200 mg BID Crestor 20 mg daily Metoprolol 25 mg once daily   Per Dr. Burt Knack, instructed her to decrease her Lasix to 20 mg daily.  Despite reporting her legs have felt fine for 4 days, bumped up her echo and office visit with Dr. Burt Knack 2 weeks to 9/24 and 9/27 so he may evaluate her legs sooner. She is concerned about her amiodarone dose and wonders if she should be taking 200 mg daily instead of BID.  She understands she will be contacted with amiodarone dosing once confirmed with Dr. Burt Knack.

## 2020-01-09 NOTE — Telephone Encounter (Signed)
See phone note 01/09/2020.

## 2020-01-10 MED ORDER — FUROSEMIDE 20 MG PO TABS: 20.0000 mg | ORAL_TABLET | Freq: Every day | ORAL | 3 refills | Status: DC

## 2020-01-10 MED ORDER — AMIODARONE HCL 200 MG PO TABS: 200.0000 mg | ORAL_TABLET | Freq: Every day | ORAL | 3 refills | Status: DC

## 2020-01-10 NOTE — Telephone Encounter (Signed)
Instructed patient to DECREASE AMIODARONE to 200 mg daily. She will keep rescheduled appointments and her legs will be reevaluated at that time. Update prescriptions for amio 200 mg daily and lasix 20 mg daily called in. She was grateful for assistance.

## 2020-01-10 NOTE — Telephone Encounter (Signed)
She has been fully loaded with amiodarone now and can reduce dose to 200 mg daily. thx

## 2020-01-11 DIAGNOSIS — I8311 Varicose veins of right lower extremity with inflammation: Secondary | ICD-10-CM | POA: Diagnosis not present

## 2020-01-11 DIAGNOSIS — I8312 Varicose veins of left lower extremity with inflammation: Secondary | ICD-10-CM | POA: Diagnosis not present

## 2020-01-11 DIAGNOSIS — I872 Venous insufficiency (chronic) (peripheral): Secondary | ICD-10-CM | POA: Diagnosis not present

## 2020-01-17 ENCOUNTER — Encounter: Payer: Self-pay | Admitting: Internal Medicine

## 2020-01-17 ENCOUNTER — Telehealth: Payer: Self-pay | Admitting: Internal Medicine

## 2020-01-17 NOTE — Telephone Encounter (Signed)
    Patient requesting a specific name of provider to see at [facility].

## 2020-01-18 NOTE — Telephone Encounter (Signed)
There are a couple of pulmonologist's that specialize in this so they will put you with one of them.  Dr Valeta Harms is one of them.  It will depend on availability.

## 2020-01-19 NOTE — Telephone Encounter (Signed)
Called pt there was no answer LMOM RTC...lmb

## 2020-01-20 ENCOUNTER — Ambulatory Visit (HOSPITAL_COMMUNITY): Payer: Medicare Other | Attending: Cardiology

## 2020-01-20 ENCOUNTER — Other Ambulatory Visit: Payer: Self-pay

## 2020-01-20 ENCOUNTER — Telehealth: Payer: Self-pay | Admitting: Internal Medicine

## 2020-01-20 DIAGNOSIS — Z952 Presence of prosthetic heart valve: Secondary | ICD-10-CM | POA: Insufficient documentation

## 2020-01-20 DIAGNOSIS — T8203XD Leakage of heart valve prosthesis, subsequent encounter: Secondary | ICD-10-CM

## 2020-01-20 LAB — ECHOCARDIOGRAM COMPLETE
AR max vel: 1.41 cm2
AV Area VTI: 1.31 cm2
AV Area mean vel: 1.2 cm2
AV Mean grad: 11 mmHg
AV Peak grad: 19.5 mmHg
Ao pk vel: 2.21 m/s
Area-P 1/2: 1.61 cm2
S' Lateral: 2.1 cm

## 2020-01-20 NOTE — Telephone Encounter (Signed)
Called pt again still no answer LMOM w/MD response.Marland KitchenLind Rosales

## 2020-01-20 NOTE — Telephone Encounter (Signed)
Spoke with patient regarding making a appt. Patient did ask to see Dr.Young .Advised patient Dr.Young does not see patient's with lung nodules anymore. As I looked in patient's chart she already has a appt with Dr.Byrum. Gave patient the address to the office and advised patient to arrive 49mins  Prior to her appt.Pateint's voice was understanding.Nothig else further needed.

## 2020-01-23 ENCOUNTER — Ambulatory Visit (INDEPENDENT_AMBULATORY_CARE_PROVIDER_SITE_OTHER): Payer: Medicare Other | Admitting: Cardiovascular Disease

## 2020-01-23 ENCOUNTER — Encounter: Payer: Self-pay | Admitting: Cardiovascular Disease

## 2020-01-23 ENCOUNTER — Other Ambulatory Visit: Payer: Self-pay

## 2020-01-23 VITALS — BP 158/72 | HR 52 | Ht 65.0 in | Wt 157.4 lb

## 2020-01-23 DIAGNOSIS — T8203XD Leakage of heart valve prosthesis, subsequent encounter: Secondary | ICD-10-CM | POA: Diagnosis not present

## 2020-01-23 DIAGNOSIS — I2510 Atherosclerotic heart disease of native coronary artery without angina pectoris: Secondary | ICD-10-CM

## 2020-01-23 DIAGNOSIS — I5032 Chronic diastolic (congestive) heart failure: Secondary | ICD-10-CM

## 2020-01-23 DIAGNOSIS — Z952 Presence of prosthetic heart valve: Secondary | ICD-10-CM | POA: Diagnosis not present

## 2020-01-23 DIAGNOSIS — I480 Paroxysmal atrial fibrillation: Secondary | ICD-10-CM

## 2020-01-23 NOTE — Patient Instructions (Signed)
Medication Instructions:  Your provider recommends that you continue on your current medications as directed. Please refer to the Current Medication list given to you today.   *If you need a refill on your cardiac medications before your next appointment, please call your pharmacy*  Lab Work: Your provider recommends that you return for lab work in: 6 months.  If you have labs (blood work) drawn today and your tests are completely normal, you will receive your results only by: Marland Kitchen MyChart Message (if you have MyChart) OR . A paper copy in the mail If you have any lab test that is abnormal or we need to change your treatment, we will call you to review the results.  Testing/Procedures: Your provider has requested that you have an echocardiogram in 6 months. Echocardiography is a painless test that uses sound waves to create images of your heart. It provides your doctor with information about the size and shape of your heart and how well your heart's chambers and valves are working. This procedure takes approximately one hour. There are no restrictions for this procedure.  Follow-Up: Your provider recommends that you schedule a follow-up appointment in 6 months.

## 2020-01-23 NOTE — Progress Notes (Signed)
Cardiology Office Note:    Date:  01/23/2020   ID:  [name], DOB [birth year], MRN [id number]  PCP:  [name], MD  [facility] Cardiologist:  [name], MD  [place] Electrophysiologist:  None   Referring MD: [name], MD   Chief Complaint  Patient presents with  . Shortness of Breath    History of Present Illness:    [name] is a 86 y.o. female with a hx of:  Persistent atrial fibrillation  ? S/p TEE-DCCV 11/2019 ? CHA2DS2-VASc=6 (age x 2, female, CAD, CHF, HTN) >> Apixaban  Bicuspid valve, aortic stenosis ? S/p bioprosthetic AVR in 2016 ? Paravalvular leak ? Normal coronary arteries prior to AVR  Coronary artery disease ? S/p NSTEMI in 01/2019 >> total occlusion of distal LAD felt to be prob embolic event ? Ischemic CM, EF 45-50 w ant-apical AK ? TEE 8/21: EF 55  Breast CA  Hypertension   Hyperlipidemia   Diastolic CHF  She presented with atrial fibrillation in August of this year, initially treated with TEE cardioversion but quickly went back into atrial fibrillation.  She was loaded with amiodarone and underwent repeat cardioversion.  She presents today for follow-up evaluation.  The patient is here alone.  Her daughter, [name], is conferenced in over the telephone.  The patient is feeling better than when I last saw her.  She has had no recent heart palpitations, chest pain, or chest pressure.  She has had some problems with her legs and complains of an aching sensation in her legs with ambulation.  This is improved since her fluid status is better.  She still complains of shortness of breath with moderate activity.  She denies orthopnea or PND.  Past Medical History:  Diagnosis Date  . Allergy   . Anemia   . Anxiety   . Aortic valve stenosis, severe   . Breast cancer ([employer]) 2007   right  . Cataract   . Hiatal hernia   . Hypercholesteremia   . Hypoglycemia   . Osteopenia   . Personal history of  radiation therapy   . Tingling   . Ulcer 2012   bleeding gastric Ulcer    Past Surgical History:  Procedure Laterality Date  . AORTIC VALVE REPLACEMENT N/A 11/16/2014   Procedure: AORTIC VALVE REPLACEMENT (AVR);  Surgeon: Gaye Pollack, MD;  Location: [place];  Service: Open Heart Surgery;  Laterality: N/A;  . BIOPSY  12/15/2017   Procedure: BIOPSY;  Surgeon: Rush Landmark Telford Nab., MD;  Location: [place];  Service: Gastroenterology;;  . BREAST BIOPSY    . BREAST LUMPECTOMY Right    2005?  Marland Kitchen BREAST SURGERY    . CARDIAC CATHETERIZATION N/A 10/18/2014   Procedure: Right/Left Heart Cath and Coronary Angiography;  Surgeon: [name], MD;  Location: [place] CV LAB;  Service: Cardiovascular;  Laterality: N/A;  . CARDIOVERSION N/A 12/09/2019   Procedure: CARDIOVERSION;  Surgeon: Josue Hector, MD;  Location: [facility] ENDOSCOPY;  Service: Cardiovascular;  Laterality: N/A;  . CARDIOVERSION N/A 12/30/2019   Procedure: CARDIOVERSION;  Surgeon: Josue Hector, MD;  Location: [facility] ENDOSCOPY;  Service: Cardiovascular;  Laterality: N/A;  . CORONARY ANGIOGRAPHY N/A 02/04/2019   Procedure: CORONARY ANGIOGRAPHY;  Surgeon: Nelva Bush, MD;  Location: [place] [facility] LAB;  Service: Cardiovascular;  Laterality: N/A;  . COSMETIC SURGERY     face  . ESOPHAGOGASTRODUODENOSCOPY (EGD) WITH PROPOFOL N/A 12/15/2017   Procedure: ESOPHAGOGASTRODUODENOSCOPY (EGD) WITH PROPOFOL;  Surgeon: Justice Britain  Brooke Bonito., MD;  Location: [place];  Service: Gastroenterology;  Laterality: N/A;  . PARATHYROIDECTOMY    . TEE WITHOUT CARDIOVERSION N/A 11/16/2014   Procedure: TRANSESOPHAGEAL ECHOCARDIOGRAM (TEE);  Surgeon: Gaye Pollack, MD;  Location: [place];  Service: Open Heart Surgery;  Laterality: N/A;  . TEE WITHOUT CARDIOVERSION N/A 12/09/2019   Procedure: TRANSESOPHAGEAL ECHOCARDIOGRAM (TEE);  Surgeon: Josue Hector, MD;  Location: [facility] ENDOSCOPY;  Service: Cardiovascular;  Laterality: N/A;  . TONSILLECTOMY       Current Medications: Current Meds  Medication Sig  . acetaminophen (TYLENOL) 500 MG tablet Take 500-1,000 mg by mouth at bedtime as needed for mild pain or moderate pain.  Marland Kitchen ALPRAZolam (XANAX) 0.5 MG tablet TAKE 1 TABLET ONCE DAILY AS NEEDED FOR AN ANXIETY, DO NOT TAKE AT NIGHT.  Marland Kitchen amiodarone (PACERONE) 200 MG tablet Take 1 tablet (200 mg total) by mouth daily.  Marland Kitchen apixaban (ELIQUIS) 5 MG TABS tablet Take 1 tablet (5 mg total) by mouth 2 (two) times daily.  Marland Kitchen aspirin EC 81 MG EC tablet Take 1 tablet (81 mg total) by mouth daily.  . Calcium Carb-Cholecalciferol (CALCIUM 600+D3) 600-400 MG-UNIT TABS Take 1 tablet by mouth daily.  . Chloral Hydrate CRYS Take 10 mLs by mouth at bedtime as needed (Call  liquid into [place]).  . Cholecalciferol (VITAMIN D-3) 1000 UNITS CAPS Take 1,000 Units by mouth daily.   . clindamycin (CLEOCIN) 300 MG capsule Take 600 mg by mouth once.   . Coenzyme Q10 (CO Q 10) 100 MG CAPS Take 100 mg by mouth daily.   . ferrous sulfate 325 (65 FE) MG tablet Take 325 mg by mouth every Monday, Wednesday, and Friday.  . fluticasone (FLONASE) 50 MCG/ACT nasal spray Place 2 sprays into both nostrils daily.  . furosemide (LASIX) 20 MG tablet Take 1 tablet (20 mg total) by mouth daily.  Marland Kitchen HYDROcodone-homatropine (HYCODAN) 5-1.5 MG/5ML syrup TAKE 5ML BY MOUTH EVERY 8 HOURS AS NEEDED FOR COUGH.  . levocetirizine (XYZAL) 5 MG tablet Take 5 mg by mouth at bedtime as needed for allergies.   . metoprolol tartrate (LOPRESSOR) 25 MG tablet Take 1 tablet (25 mg total) by mouth daily.  . nitroGLYCERIN (NITROSTAT) 0.4 MG SL tablet Place 1 tablet (0.4 mg total) under the tongue every 5 (five) minutes as needed for chest pain (CP or SOB).  Marland Kitchen Ophthalmic Irrigation Solution (OCUSOFT EYE [place] OP) Apply 1 application to eye 2 (two) times daily as needed (dry eyes). Morning and before bed  . pantoprazole (PROTONIX) 40 MG tablet Take 1 tablet (40 mg total) by mouth 2 (two) times  daily.  . potassium chloride (KLOR-CON) 10 MEQ tablet Take 1 tablet (10 mEq total) by mouth daily.  . rosuvastatin (CRESTOR) 20 MG tablet Take 1 tablet (20 mg total) by mouth daily.  Marland Kitchen tretinoin (RETIN-A) 0.1 % cream Apply 1 application topically 3 (three) times a week.   . Wheat Dextrin (BENEFIBER DRINK MIX PO) Take 10 mLs by mouth every morning. Mix with 1/2 a glass water,     Allergies:   Lactose intolerance (gi), Amoxicillin-pot clavulanate, Penicillin g, Penicillins, Sulfa antibiotics, Sulfamethoxazole, and Sulfonamide derivatives   Social History   Socioeconomic History  . Marital status: Unknown    Spouse name: Not on file  . Number of children: 3  . Years of education: 16  . Highest education level: Bachelor's degree (e.g., BA, AB, BS)  Occupational History  . Occupation: Retired  Tobacco Use  .  Smoking status: Former Smoker    Packs/day: 0.50    Years: 15.00    Pack years: 7.50    Types: Cigarettes    Start date: 74    Quit date: 1986    Years since quitting: 35.7  . Smokeless tobacco: Never Used  . Tobacco comment: quit smoking 1980  Vaping Use  . Vaping Use: Never used  Substance and Sexual Activity  . Alcohol use: Yes    Alcohol/week: 7.0 standard drinks    Types: 7 Glasses of wine per week    Comment: wine nightly with dinner  . Drug use: No  . Sexual activity: Not Currently  Other Topics Concern  . Not on file  Social History Narrative   ** Merged History Encounter **       Lives at [facility] with her husband. Right-handed. Caffeine use: 3-4 cups per day (some tea, mixes coffee with decaf).   Social Determinants of Health   Financial Resource Strain:   . Difficulty of Paying Living Expenses: Not on file  Food Insecurity:   . Worried About Charity fundraiser in the Last Year: Not on file  . Ran Out of Food in the Last Year: Not on file  Transportation Needs:   . Lack of Transportation (Medical): Not on file  . Lack of Transportation  (Non-Medical): Not on file  Physical Activity:   . Days of Exercise per Week: Not on file  . Minutes of Exercise per Session: Not on file  Stress:   . Feeling of Stress : Not on file  Social Connections:   . Frequency of Communication with Friends and Family: Not on file  . Frequency of Social Gatherings with Friends and Family: Not on file  . Attends Religious Services: Not on file  . Active Member of Clubs or Organizations: Not on file  . Attends Archivist Meetings: Not on file  . Marital Status: Not on file     Family History: The patient's family history includes Cancer (age of onset: 30) in her cousin; Colon cancer in her mother; Hypertension in her mother; Kidney disease in her father; Rectal cancer in her mother. There is no history of Esophageal cancer, Stomach cancer, or Colon polyps.  ROS:   Please see the history of present illness.    All other systems reviewed and are negative.  EKGs/Labs/Other Studies Reviewed:    The following studies were reviewed today: Echo 01-20-2020: IMPRESSIONS    1. Left ventricular ejection fraction, by estimation, is 55 to 60%. The  left ventricle has normal function. The left ventricle has no regional  wall motion abnormalities. There is mild left ventricular hypertrophy.  Left ventricular diastolic parameters  are consistent with Grade II diastolic dysfunction (pseudonormalization).  2. Right ventricular systolic function is mildly reduced. The right  ventricular size is moderately enlarged. There is mildly elevated  pulmonary artery systolic pressure. The estimated right ventricular  systolic pressure is 88.4 mmHg.  3. Left atrial size was moderately dilated.  4. Right atrial size was mildly dilated.  5. The mitral valve is normal in structure. No evidence of mitral valve  regurgitation. No evidence of mitral stenosis.  6. Bioprosthetic aortic valve. Mean gradient 11 mmHg, not significantly  elevated. There is at  least mild paravalvular leakage, this may not be  fully visualized.  7. The inferior vena cava is dilated in size with >50% respiratory  variability, suggesting right atrial pressure of 8 mmHg.   EKG:  EKG is ordered today.  The ekg ordered today demonstrates sinus bradycardia 52 bpm, occasional PAC.   Recent Labs: 06/20/2019: ALT 17 12/06/2019: Platelets 176; TSH 1.330 12/30/2019: BUN 25; Creatinine, Ser 1.00; Hemoglobin 15.3; Potassium 4.8; Sodium 138  Recent Lipid Panel    Component Value Date/Time   CHOL 230 (H) 06/20/2019 0929   TRIG 73.0 06/20/2019 0929   HDL 94.90 06/20/2019 0929   CHOLHDL 2 06/20/2019 0929   VLDL 14.6 06/20/2019 0929   LDLCALC 120 (H) 06/20/2019 0929   LDLDIRECT 144.0 11/29/2012 1601    Physical Exam:    VS:  BP (!) 158/72   Pulse (!) 52   Ht 5\' 5"  (1.651 m)   Wt 157 lb 6.4 oz (71.4 kg)   SpO2 97%   BMI 26.19 kg/m     Wt Readings from Last 3 Encounters:  01/23/20 157 lb 6.4 oz (71.4 kg)  12/23/19 171 lb (77.6 kg)  12/23/19 170 lb (77.1 kg)     GEN:  Well nourished, well developed in no acute distress HEENT: Normal NECK: No JVD; No carotid bruits LYMPHATICS: No lymphadenopathy CARDIAC: RRR, grade 2/6 diastolic decrescendo murmur at the left lower sternal border RESPIRATORY:  Clear to auscultation without rales, wheezing or rhonchi  ABDOMEN: Soft, non-tender, non-distended MUSCULOSKELETAL:  No edema; No deformity  SKIN: Warm and dry NEUROLOGIC:  Alert and oriented x 3 PSYCHIATRIC:  Normal affect   ASSESSMENT:    1. Paroxysmal atrial fibrillation (HCC)   2. Paravalvular leak of prosthetic heart valve, subsequent encounter   3. S/P AVR   4. Coronary artery disease involving native coronary artery of native heart without angina pectoris   5. Chronic diastolic heart failure (HCC)    PLAN:    In order of problems listed above:  1. Now maintaining sinus rhythm after repeat cardioversion on amiodarone.  Tolerating oral anticoagulation with  apixaban.  Discussed need for amiodarone monitoring.  We will arrange thyroid function testing and liver function testing when she returns in 6 months.  I talked to her about annual ophthalmology evaluations.  She has chest imaging performed on a regular basis to follow a pulmonary nodule. 2. Stable by echo and exam.  I think she has mild to moderate paravalvular regurgitation 3. As above, normal transvalvular gradients, stable mild to moderate paravalvular regurgitation. 4. LVEF is improved by echo assessment.  Patient is doing well with no angina. 5. We discussed heart failure causes and management.  I have suspected a combination of diastolic dysfunction and atrial fibrillation contributing to volume overload and heart failure.  She seems to be doing much better now with resolution of her leg swelling and some improvement in her breathing.  Medication Adjustments/Labs and Tests Ordered: Current medicines are reviewed at length with the patient today.  Concerns regarding medicines are outlined above.  Orders Placed This Encounter  Procedures  . Comprehensive metabolic panel  . TSH  . T4, free  . EKG 12-Lead  . ECHOCARDIOGRAM COMPLETE   No orders of the defined types were placed in this encounter.   Patient Instructions  Medication Instructions:  Your provider recommends that you continue on your current medications as directed. Please refer to the Current Medication list given to you today.   *If you need a refill on your cardiac medications before your next appointment, please call your pharmacy*  Lab Work: Your provider recommends that you return for lab work in: 6 months.  If you have labs (blood work) drawn today and  your tests are completely normal, you will receive your results only by: Marland Kitchen MyChart Message (if you have MyChart) OR . A paper copy in the mail If you have any lab test that is abnormal or we need to change your treatment, we will call you to review the  results.  Testing/Procedures: Your provider has requested that you have an echocardiogram in 6 months. Echocardiography is a painless test that uses sound waves to create images of your heart. It provides your doctor with information about the size and shape of your heart and how well your heart's chambers and valves are working. This procedure takes approximately one hour. There are no restrictions for this procedure.  Follow-Up: Your provider recommends that you schedule a follow-up appointment in 6 months.    Signed, [name], MD  01/23/2020 4:34 PM    [place]

## 2020-01-24 ENCOUNTER — Telehealth: Payer: Self-pay | Admitting: Internal Medicine

## 2020-01-24 NOTE — Progress Notes (Signed)
°  Chronic Care Management   Note  01/24/2020 Name: Quantavia Frith MRN: [id number] DOB: 11/26/1932  Rosiland Oz Cragin is a 86 y.o. year old female who is a primary care patient of Burns, Claudina Lick, MD. I reached out to [employer] by phone today in response to a referral sent by Ms. Rosiland Oz Gloria's PCP, Quay Burow, Claudina Lick, MD.   Ms. Cothern was given information about Chronic Care Management services today including:  1. CCM service includes personalized support from designated clinical staff supervised by her physician, including individualized plan of care and coordination with other care providers 2. 24/7 contact phone numbers for assistance for urgent and routine care needs. 3. Service will only be billed when office clinical staff spend 20 minutes or more in a month to coordinate care. 4. Only one practitioner may furnish and bill the service in a calendar month. 5. The patient may stop CCM services at any time (effective at the end of the month) by phone call to the office staff.   Patient agreed to services and verbal consent obtained.   Follow up plan:   Carley Perdue [employer] Scheduler

## 2020-01-28 ENCOUNTER — Telehealth: Payer: Self-pay | Admitting: Internal Medicine

## 2020-01-28 NOTE — Telephone Encounter (Signed)
Cardiology Moonlighter Note  Returned page from patient. Has history of AF. Takes amiodarone. Was recently reduced to once daily. Noted last night HR 48. Now HR 44. She is completely asymptomatic. Feels very well. Has no exertional symptoms. Has been active all day today and has been without symptoms. Doesn't know what her HR is normally.   Recommended patient further reduce her amiodarone to half tablet daily (100mg  once daily). She will monitor closely for symptoms. Recommended she call back or come to ED if she develops any exertional symptoms, dizziness, lightheadedness, syncope, presyncope. Patient agrees and understands.   Will send message to Dr Darlina Guys, MD Cardiology Fellow, PGY-8

## 2020-01-31 ENCOUNTER — Other Ambulatory Visit: Payer: Self-pay | Admitting: Internal Medicine

## 2020-01-31 ENCOUNTER — Telehealth: Payer: Self-pay | Admitting: Cardiovascular Disease

## 2020-01-31 ENCOUNTER — Other Ambulatory Visit: Payer: Self-pay | Admitting: Gastroenterology

## 2020-01-31 DIAGNOSIS — R001 Bradycardia, unspecified: Secondary | ICD-10-CM

## 2020-01-31 MED ORDER — AMIODARONE HCL 200 MG PO TABS: 100.0000 mg | ORAL_TABLET | Freq: Every day | ORAL | 3 refills | Status: DC

## 2020-01-31 MED ORDER — METOPROLOL TARTRATE 25 MG PO TABS: 12.5000 mg | ORAL_TABLET | Freq: Two times a day (BID) | ORAL | 3 refills | Status: DC

## 2020-01-31 NOTE — Telephone Encounter (Signed)
STAT if HR is under 50 or over 120 (normal HR is 60-100 beats per minute)  1) What is your heart rate?  Patient states her HR has been so faint that she has been unable to obtain additional readings  2) Do you have a log of your heart rate readings (document readings)?  48, 33, 49 3) Do you have any other symptoms? No

## 2020-01-31 NOTE — Telephone Encounter (Signed)
Please have her reduce amiodarone to 100 mg daily, metoprolol to 12.5 mg BID, and arrange 3 day ZIO. thanks

## 2020-01-31 NOTE — Telephone Encounter (Signed)
Confirmed with patient she got medication instructions. She agrees to [employer]. She was grateful for assistance.

## 2020-01-31 NOTE — Telephone Encounter (Signed)
Called number provided. Left message to call back.

## 2020-01-31 NOTE — Telephone Encounter (Signed)
Attempted to call patient with instructions. Her husband picked up and she is unavailable.  Informed Mr. Sutphin a MyChart message will be sent with medication instructions. He understands to have the patient respond to message.   See MyChart message from 10/5.

## 2020-01-31 NOTE — Telephone Encounter (Signed)
Complaining of low HR some readings are 48, 33, 49. Currently it is 58. Patient stating that is the highest it has been in 3 days. She has had a headache over the last few days and has not taken anything for it but it has gone away. Blood pressure 164/67, HR 47. Has not taken any medication yet today. She is concerned that her medications need to reduced. No dizziness or weakness.   Will forward to Dr. Burt Knack and his nurse Valetta Fuller for advisement.

## 2020-01-31 NOTE — Telephone Encounter (Signed)
[name] is returning [employer]. She is requesting she be called back on the number [phone]. She states she gave that number to be called prior and the wrong number was called back.

## 2020-02-01 ENCOUNTER — Telehealth: Payer: Self-pay | Admitting: Radiology

## 2020-02-01 NOTE — Telephone Encounter (Signed)
Enrolled patient for a 3 day Zio XT monitor to be mailed to patients home  

## 2020-02-06 ENCOUNTER — Other Ambulatory Visit (INDEPENDENT_AMBULATORY_CARE_PROVIDER_SITE_OTHER): Payer: Medicare Other

## 2020-02-06 ENCOUNTER — Other Ambulatory Visit (HOSPITAL_COMMUNITY): Payer: Medicare Other

## 2020-02-06 DIAGNOSIS — R001 Bradycardia, unspecified: Secondary | ICD-10-CM | POA: Diagnosis not present

## 2020-02-07 ENCOUNTER — Ambulatory Visit (INDEPENDENT_AMBULATORY_CARE_PROVIDER_SITE_OTHER): Payer: Medicare Other | Admitting: Emergency Medicine

## 2020-02-07 ENCOUNTER — Other Ambulatory Visit: Payer: Self-pay

## 2020-02-07 ENCOUNTER — Encounter: Payer: Self-pay | Admitting: Emergency Medicine

## 2020-02-07 DIAGNOSIS — R911 Solitary pulmonary nodule: Secondary | ICD-10-CM

## 2020-02-07 DIAGNOSIS — I2510 Atherosclerotic heart disease of native coronary artery without angina pectoris: Secondary | ICD-10-CM | POA: Diagnosis not present

## 2020-02-07 DIAGNOSIS — IMO0001 Reserved for inherently not codable concepts without codable children: Secondary | ICD-10-CM

## 2020-02-07 NOTE — Assessment & Plan Note (Signed)
Small groundglass pulmonary nodule that has increased slightly in size over 3-1/2 years on serial imaging.  It now has a 4 mm solid component.  Suspect that this is highly differentiated adenocarcinoma.  She has significant risk for primary resection and I think the best strategy here is bronchoscopy with biopsy, leading to radiation therapy if indicated.  I explained this to her today.  She understands.  She is going to discuss with family and then we will revisit at my next opening.  We reviewed her CT scans of the chest today.  There is a 1.2 cm right upper lobe pulmonary nodule that has slowly increased in size as it has been followed over the last 3-1/2 years.  It now also has a very small slightly solid component that is new.  It has the characteristics of the slowest growing kind of lung cancer called an adenocarcinoma. We talked today about the options for diagnosis and therapy.  I believe that you are at increased risk for a primary surgical lung resection.  Instead I believe we should strongly consider bronchoscopy with biopsies of the small nodule.  If we confirm that this is a primary lung cancer then I would recommend stereotactic focused radiation therapy to treat it with intent to cure. Please talk about the options with the family and then we can discuss in the near future.  Depending on our discussions we will either arrange for bronchoscopy and biopsy or make a strategy to follow the nodule with further CT scans going forward. Follow with Dr Lamonte Sakai in 1 month or next available.

## 2020-02-07 NOTE — Patient Instructions (Signed)
We reviewed her CT scans of the chest today.  There is a 1.2 cm right upper lobe pulmonary nodule that has slowly increased in size as it has been followed over the last 3-1/2 years.  It now also has a very small slightly solid component that is new.  It has the characteristics of the slowest growing kind of lung cancer called an adenocarcinoma. We talked today about the options for diagnosis and therapy.  I believe that you are at increased risk for a primary surgical lung resection.  Instead I believe we should strongly consider bronchoscopy with biopsies of the small nodule.  If we confirm that this is a primary lung cancer then I would recommend stereotactic focused radiation therapy to treat it with intent to cure. Please talk about the options with the family and then we can discuss in the near future.  Depending on our discussions we will either arrange for bronchoscopy and biopsy or make a strategy to follow the nodule with further CT scans going forward. Follow with Dr Lamonte Sakai in 1 month or next available.

## 2020-02-07 NOTE — Progress Notes (Signed)
Subjective:    Patient ID: [name], female    DOB: [birth year], 86 y.o.   MRN: [id number]  HPI  86 year old former smoker (10 pack years) with a history of breast cancer, AVR for severe aortic stenosis, CAD, A fib (amio, eliquis) with associated diastolic CHF,  allergies, GERD with hiatal hernia.  She has been undergoing serial CT scans of the chest to follow a rounded groundglass right upper lobe pulmonary nodule.  It looks like this was first discovered on a CT chest 09/25/2016 performed to evaluate a chest wall lipoma.  I have reviewed all of her CTs.  There has been slow increase in size in the pulmonary nodule and there is now a small solid component.  Most recent CT shows that the largest diameter is now 1.2 cm with a 4 mm more solid area.  She is referred today to discuss evaluation of the nodule.   She reports that she is doing well, denies SOB, CP, cough.   Pulmonary function testing done 11/14/2014 reviewed by me, shows mild obstruction without a bronchodilator response, normal lung volumes and a decreased diffusion capacity.   Review of Systems As per HPI  Past Medical History:  Diagnosis Date  . Allergy   . Anemia   . Anxiety   . Aortic valve stenosis, severe    s/p AVR // mild to mod paravalvular leakage // Echo 9/21: EF 55-60, no RWMA, mild LVH, GR 2 DD, mildly reduced RVSF, RVSP 38.7, moderate LAE, mild RAE, AVR with mean gradient 11 mmHg, mild paravalvular leakage  . Breast cancer ([place]) 2007   right  . Cataract   . Hiatal hernia   . Hypercholesteremia   . Hypoglycemia   . Osteopenia   . Personal history of radiation therapy   . Tingling   . Ulcer 2012   bleeding gastric Ulcer     Family History  Problem Relation Age of Onset  . Hypertension Mother   . Rectal cancer Mother   . Colon cancer Mother   . Kidney disease Father   . Cancer Cousin 63       breast and ovarian   . Esophageal cancer Neg Hx   . Stomach cancer Neg Hx   . Colon polyps Neg  Hx      Social History   Socioeconomic History  . Marital status: Unknown    Spouse name: Not on file  . Number of children: 3  . Years of education: 16  . Highest education level: Bachelor's degree (e.g., BA, AB, BS)  Occupational History  . Occupation: Retired  Tobacco Use  . Smoking status: Former Smoker    Packs/day: 0.50    Years: 15.00    Pack years: 7.50    Types: Cigarettes    Start date: 19    Quit date: 1986    Years since quitting: 35.8  . Smokeless tobacco: Never Used  . Tobacco comment: quit smoking 1980  Vaping Use  . Vaping Use: Never used  Substance and Sexual Activity  . Alcohol use: Yes    Alcohol/week: 7.0 standard drinks    Types: 7 Glasses of wine per week    Comment: wine nightly with dinner  . Drug use: No  . Sexual activity: Not Currently  Other Topics Concern  . Not on file  Social History Narrative   ** Merged History Encounter **       Lives at [facility] with her husband. Right-handed. Caffeine use: 3-4 cups  per day (some tea, mixes coffee with decaf).   Social Determinants of Health   Financial Resource Strain:   . Difficulty of Paying Living Expenses: Not on file  Food Insecurity:   . Worried About Charity fundraiser in the Last Year: Not on file  . Ran Out of Food in the Last Year: Not on file  Transportation Needs:   . Lack of Transportation (Medical): Not on file  . Lack of Transportation (Non-Medical): Not on file  Physical Activity:   . Days of Exercise per Week: Not on file  . Minutes of Exercise per Session: Not on file  Stress:   . Feeling of Stress : Not on file  Social Connections:   . Frequency of Communication with Friends and Family: Not on file  . Frequency of Social Gatherings with Friends and Family: Not on file  . Attends Religious Services: Not on file  . Active Member of Clubs or Organizations: Not on file  . Attends Archivist Meetings: Not on file  . Marital Status: Not on file  Intimate  Partner Violence:   . Fear of Current or Ex-Partner: Not on file  . Emotionally Abused: Not on file  . Physically Abused: Not on file  . Sexually Abused: Not on file     Allergies  Allergen Reactions  . Lactose Intolerance (Gi) Other (See Comments)    GI upset  . Amoxicillin-Pot Clavulanate Hives and Rash  . Penicillin G Rash  . Penicillins Hives and Rash    30 years  . Sulfa Antibiotics Rash  . Sulfamethoxazole Rash  . Sulfonamide Derivatives Hives     Outpatient Medications Prior to Visit  Medication Sig Dispense Refill  . acetaminophen (TYLENOL) 500 MG tablet Take 500-1,000 mg by mouth at bedtime as needed for mild pain or moderate pain.    Marland Kitchen ALPRAZolam (XANAX) 0.5 MG tablet TAKE 1 TABLET ONCE DAILY AS NEEDED FOR AN ANXIETY, DO NOT TAKE AT NIGHT. 90 tablet 0  . amiodarone (PACERONE) 200 MG tablet Take 0.5 tablets (100 mg total) by mouth daily. 45 tablet 3  . apixaban (ELIQUIS) 5 MG TABS tablet Take 1 tablet (5 mg total) by mouth 2 (two) times daily. 60 tablet 6  . aspirin EC 81 MG EC tablet Take 1 tablet (81 mg total) by mouth daily. 90 tablet 3  . Calcium Carb-Cholecalciferol (CALCIUM 600+D3) 600-400 MG-UNIT TABS Take 1 tablet by mouth daily.    . Chloral Hydrate CRYS Take 10 mLs by mouth at bedtime as needed (Call  liquid into [place]). 900 g 1  . Cholecalciferol (VITAMIN D-3) 1000 UNITS CAPS Take 1,000 Units by mouth daily.     . clindamycin (CLEOCIN) 300 MG capsule Take 600 mg by mouth once.     . Coenzyme Q10 (CO Q 10) 100 MG CAPS Take 100 mg by mouth daily.     . ferrous sulfate 325 (65 FE) MG tablet Take 325 mg by mouth every Monday, Wednesday, and Friday.    . fluticasone (FLONASE) 50 MCG/ACT nasal spray Place 2 sprays into both nostrils daily. 16 g 6  . furosemide (LASIX) 20 MG tablet Take 1 tablet (20 mg total) by mouth daily. 90 tablet 3  . HYDROcodone-homatropine (HYCODAN) 5-1.5 MG/5ML syrup TAKE 5ML BY MOUTH EVERY 8 HOURS AS NEEDED FOR COUGH. 120  mL 0  . metoprolol tartrate (LOPRESSOR) 25 MG tablet Take 0.5 tablets (12.5 mg total) by mouth 2 (two) times daily. [place]  tablet 3  . montelukast (SINGULAIR) 10 MG tablet TAKE 1 TABLET EACH DAY. 30 tablet 0  . nitroGLYCERIN (NITROSTAT) 0.4 MG SL tablet Place 1 tablet (0.4 mg total) under the tongue every 5 (five) minutes as needed for chest pain (CP or SOB). 25 tablet 3  . Ophthalmic Irrigation Solution (OCUSOFT EYE [place] OP) Apply 1 application to eye 2 (two) times daily as needed (dry eyes). Morning and before bed    . pantoprazole (PROTONIX) 40 MG tablet TAKE 1 TABLET BY MOUTH TWICE DAILY. 180 tablet 0  . potassium chloride (KLOR-CON) 10 MEQ tablet Take 1 tablet (10 mEq total) by mouth daily. 90 tablet 3  . rosuvastatin (CRESTOR) 20 MG tablet Take 1 tablet (20 mg total) by mouth daily. 90 tablet 3  . tretinoin (RETIN-A) 0.1 % cream Apply 1 application topically 3 (three) times a week.     . Wheat Dextrin (BENEFIBER DRINK MIX PO) Take 10 mLs by mouth every morning. Mix with 1/2 a glass water,    . levocetirizine (XYZAL) 5 MG tablet Take 5 mg by mouth at bedtime as needed for allergies.  (Patient not taking: Reported on 02/07/2020)     No facility-administered medications prior to visit.         Objective:   Physical Exam Vitals:   02/07/20 1438  BP: 124/68  Pulse: (!) 52  Temp: (!) 97.4 F (36.3 C)  TempSrc: Temporal  SpO2: 98%  Weight: 160 lb 9.6 oz (72.8 kg)  Height: 5\' 5"  (1.651 m)   Gen: Pleasant, well-nourished, in no distress,  normal affect  ENT: No lesions,  mouth clear,  oropharynx clear, no postnasal drip  Neck: No JVD, no stridor  Lungs: No use of accessory muscles, no crackles or wheezing on normal respiration, no wheeze on forced expiration  Cardiovascular: RRR, heart sounds normal, no murmur or gallops, no peripheral edema  Musculoskeletal: No deformities, no cyanosis or clubbing  Neuro: alert, awake, non focal  Skin: Warm, no lesions or rash         Assessment & Plan:  Lung nodule < 6cm on CT Small groundglass pulmonary nodule that has increased slightly in size over 3-1/2 years on serial imaging.  It now has a 4 mm solid component.  Suspect that this is highly differentiated adenocarcinoma.  She has significant risk for primary resection and I think the best strategy here is bronchoscopy with biopsy, leading to radiation therapy if indicated.  I explained this to her today.  She understands.  She is going to discuss with family and then we will revisit at my next opening.  We reviewed her CT scans of the chest today.  There is a 1.2 cm right upper lobe pulmonary nodule that has slowly increased in size as it has been followed over the last 3-1/2 years.  It now also has a very small slightly solid component that is new.  It has the characteristics of the slowest growing kind of lung cancer called an adenocarcinoma. We talked today about the options for diagnosis and therapy.  I believe that you are at increased risk for a primary surgical lung resection.  Instead I believe we should strongly consider bronchoscopy with biopsies of the small nodule.  If we confirm that this is a primary lung cancer then I would recommend stereotactic focused radiation therapy to treat it with intent to cure. Please talk about the options with the family and then we can discuss in the near future.  Depending  on our discussions we will either arrange for bronchoscopy and biopsy or make a strategy to follow the nodule with further CT scans going forward. Follow with Dr Lamonte Sakai in 1 month or next available.  Baltazar Apo, MD, PhD 02/07/2020, 3:10 PM [facility] [phone] or if no answer [phone]

## 2020-02-08 ENCOUNTER — Ambulatory Visit: Payer: Medicare Other | Admitting: Cardiovascular Disease

## 2020-02-13 DIAGNOSIS — R001 Bradycardia, unspecified: Secondary | ICD-10-CM | POA: Diagnosis not present

## 2020-02-14 ENCOUNTER — Encounter: Payer: Self-pay | Admitting: Internal Medicine

## 2020-02-15 ENCOUNTER — Ambulatory Visit: Payer: Medicare Other

## 2020-02-21 ENCOUNTER — Other Ambulatory Visit: Payer: Self-pay | Admitting: Internal Medicine

## 2020-02-22 ENCOUNTER — Ambulatory Visit (INDEPENDENT_AMBULATORY_CARE_PROVIDER_SITE_OTHER): Payer: Medicare Other | Admitting: Otolaryngology

## 2020-02-22 ENCOUNTER — Other Ambulatory Visit: Payer: Self-pay

## 2020-02-22 ENCOUNTER — Encounter (INDEPENDENT_AMBULATORY_CARE_PROVIDER_SITE_OTHER): Payer: Self-pay | Admitting: Otolaryngology

## 2020-02-22 VITALS — Temp 96.6°F

## 2020-02-22 DIAGNOSIS — I2510 Atherosclerotic heart disease of native coronary artery without angina pectoris: Secondary | ICD-10-CM

## 2020-02-22 DIAGNOSIS — H6123 Impacted cerumen, bilateral: Secondary | ICD-10-CM | POA: Diagnosis not present

## 2020-02-22 NOTE — Progress Notes (Signed)
HPI: [name] is a 86 y.o. female who presents for evaluation of wax buildup in her ears.  She saw an audiologist to get hearing aids and they said she had too much wax in her years before they can do testing.  She is also had recent epistaxis from the right side of her nose about a month ago.  She also complains of itching in her ears.  She has tried a baby oil without success.  She does wear earplugs at night because her husband snores.  Past Medical History:  Diagnosis Date  . Allergy   . Anemia   . Anxiety   . Aortic valve stenosis, severe    s/p AVR // mild to mod paravalvular leakage // Echo 9/21: EF 55-60, no RWMA, mild LVH, GR 2 DD, mildly reduced RVSF, RVSP 38.7, moderate LAE, mild RAE, AVR with mean gradient 11 mmHg, mild paravalvular leakage  . Breast cancer ([place]) 2007   right  . Cataract   . Hiatal hernia   . Hypercholesteremia   . Hypoglycemia   . Osteopenia   . Personal history of radiation therapy   . Tingling   . Ulcer 2012   bleeding gastric Ulcer   Past Surgical History:  Procedure Laterality Date  . AORTIC VALVE REPLACEMENT N/A 11/16/2014   Procedure: AORTIC VALVE REPLACEMENT (AVR);  Surgeon: Gaye Pollack, MD;  Location: [place];  Service: Open Heart Surgery;  Laterality: N/A;  . BIOPSY  12/15/2017   Procedure: BIOPSY;  Surgeon: Rush Landmark Telford Nab., MD;  Location: [facility];  Service: Gastroenterology;;  . BREAST BIOPSY    . BREAST LUMPECTOMY Right    2005?  Marland Kitchen BREAST SURGERY    . CARDIAC CATHETERIZATION N/A 10/18/2014   Procedure: Right/Left Heart Cath and Coronary Angiography;  Surgeon: [name], MD;  Location: [place] [facility];  Service: Cardiovascular;  Laterality: N/A;  . CARDIOVERSION N/A 12/09/2019   Procedure: CARDIOVERSION;  Surgeon: Josue Hector, MD;  Location: [facility] ENDOSCOPY;  Service: Cardiovascular;  Laterality: N/A;  . CARDIOVERSION N/A 12/30/2019   Procedure: CARDIOVERSION;  Surgeon: Josue Hector, MD;  Location: [facility]  ENDOSCOPY;  Service: Cardiovascular;  Laterality: N/A;  . CORONARY ANGIOGRAPHY N/A 02/04/2019   Procedure: CORONARY ANGIOGRAPHY;  Surgeon: Nelva Bush, MD;  Location: [place] [facility] LAB;  Service: Cardiovascular;  Laterality: N/A;  . COSMETIC SURGERY     face  . ESOPHAGOGASTRODUODENOSCOPY (EGD) WITH PROPOFOL N/A 12/15/2017   Procedure: ESOPHAGOGASTRODUODENOSCOPY (EGD) WITH PROPOFOL;  Surgeon: Rush Landmark Telford Nab., MD;  Location: [place];  Service: Gastroenterology;  Laterality: N/A;  . PARATHYROIDECTOMY    . TEE WITHOUT CARDIOVERSION N/A 11/16/2014   Procedure: TRANSESOPHAGEAL ECHOCARDIOGRAM (TEE);  Surgeon: Gaye Pollack, MD;  Location: [place];  Service: Open Heart Surgery;  Laterality: N/A;  . TEE WITHOUT CARDIOVERSION N/A 12/09/2019   Procedure: TRANSESOPHAGEAL ECHOCARDIOGRAM (TEE);  Surgeon: Josue Hector, MD;  Location: [facility] ENDOSCOPY;  Service: Cardiovascular;  Laterality: N/A;  . TONSILLECTOMY     Social History   Socioeconomic History  . Marital status: Unknown    Spouse name: Not on file  . Number of children: 3  . Years of education: 16  . Highest education level: Bachelor's degree (e.g., BA, AB, BS)  Occupational History  . Occupation: Retired  Tobacco Use  . Smoking status: Former Smoker    Packs/day: 0.50    Years: 15.00    Pack years: 7.50    Types: Cigarettes    Start date: 1971  Quit date: 56    Years since quitting: 35.8  . Smokeless tobacco: Never Used  . Tobacco comment: quit smoking 1980  Vaping Use  . Vaping Use: Never used  Substance and Sexual Activity  . Alcohol use: Yes    Alcohol/week: 7.0 standard drinks    Types: 7 Glasses of wine per week    Comment: wine nightly with dinner  . Drug use: No  . Sexual activity: Not Currently  Other Topics Concern  . Not on file  Social History Narrative   ** Merged History Encounter **       Lives at [facility] with her husband. Right-handed. Caffeine use: 3-4 cups per day (some tea, mixes  coffee with decaf).   Social Determinants of Health   Financial Resource Strain:   . Difficulty of Paying Living Expenses: Not on file  Food Insecurity:   . Worried About Charity fundraiser in the Last Year: Not on file  . Ran Out of Food in the Last Year: Not on file  Transportation Needs:   . Lack of Transportation (Medical): Not on file  . Lack of Transportation (Non-Medical): Not on file  Physical Activity:   . Days of Exercise per Week: Not on file  . Minutes of Exercise per Session: Not on file  Stress:   . Feeling of Stress : Not on file  Social Connections:   . Frequency of Communication with Friends and Family: Not on file  . Frequency of Social Gatherings with Friends and Family: Not on file  . Attends Religious Services: Not on file  . Active Member of Clubs or Organizations: Not on file  . Attends Archivist Meetings: Not on file  . Marital Status: Not on file   Family History  Problem Relation Age of Onset  . Hypertension Mother   . Rectal cancer Mother   . Colon cancer Mother   . Kidney disease Father   . Cancer Cousin 63       breast and ovarian   . Esophageal cancer Neg Hx   . Stomach cancer Neg Hx   . Colon polyps Neg Hx    Allergies  Allergen Reactions  . Lactose Intolerance (Gi) Other (See Comments)    GI upset  . Amoxicillin-Pot Clavulanate Hives and Rash  . Penicillin G Rash  . Penicillins Hives and Rash    30 years  . Sulfa Antibiotics Rash  . Sulfamethoxazole Rash  . Sulfonamide Derivatives Hives   Prior to Admission medications   Medication Sig Start Date End Date Taking? Authorizing Provider  acetaminophen (TYLENOL) 500 MG tablet Take 500-1,000 mg by mouth at bedtime as needed for mild pain or moderate pain.    [provider]  ALPRAZolam (XANAX) 0.5 MG tablet TAKE 1 TABLET ONCE DAILY AS NEEDED FOR AN ANXIETY, DO NOT TAKE AT NIGHT. 11/22/19   [name], MD  amiodarone (PACERONE) 200 MG tablet Take 0.5 tablets (100  mg total) by mouth daily. 01/31/20 01/25/21  [name], MD  apixaban (ELIQUIS) 5 MG TABS tablet Take 1 tablet (5 mg total) by mouth 2 (two) times daily. 12/06/19   Jerline Pain, MD  Calcium Carb-Cholecalciferol (CALCIUM 600+D3) 600-400 MG-UNIT TABS Take 1 tablet by mouth daily.    [provider]  Chloral Hydrate CRYS Take 10 mLs by mouth at bedtime as needed (Call  liquid into [place]). 12/23/19   [name], MD  Cholecalciferol (VITAMIN D-3) 1000  UNITS CAPS Take 1,000 Units by mouth daily.     [provider]  clindamycin (CLEOCIN) 300 MG capsule Take 600 mg by mouth once.     [provider]  Coenzyme Q10 (CO Q 10) 100 MG CAPS Take 100 mg by mouth daily.     [provider]  ferrous sulfate 325 (65 FE) MG tablet Take 325 mg by mouth every Monday, Wednesday, and Friday.    [provider]  fluticasone (FLONASE) 50 MCG/ACT nasal spray Place 2 sprays into both nostrils daily. 03/02/18   Marrian Salvage, FNP  furosemide (LASIX) 20 MG tablet Take 1 tablet (20 mg total) by mouth daily. 01/10/20 01/04/21  [name], MD  HYDROcodone-homatropine (HYCODAN) 5-1.5 MG/5ML syrup TAKE 5ML BY MOUTH EVERY 8 HOURS AS NEEDED FOR COUGH. 11/28/19   Burns, Claudina Lick, MD  levocetirizine (XYZAL) 5 MG tablet Take 5 mg by mouth at bedtime as needed for allergies.  Patient not taking: Reported on 02/07/2020 10/11/18   [provider]  montelukast (SINGULAIR) 10 MG tablet TAKE 1 TABLET EACH DAY. 02/21/20   [name], MD  nitroGLYCERIN (NITROSTAT) 0.4 MG SL tablet Place 1 tablet (0.4 mg total) under the tongue every 5 (five) minutes as needed for chest pain (CP or SOB). 02/05/19   [facility], Tami Lin, PA  Ophthalmic Irrigation Solution (OCUSOFT EYE [place] OP) Apply 1 application to eye 2 (two) times daily as needed (dry eyes). Morning and before bed    [provider]  pantoprazole (PROTONIX) 40 MG tablet TAKE 1 TABLET BY MOUTH  TWICE DAILY. 01/31/20   Mansouraty, Telford Nab., MD  potassium chloride (KLOR-CON) 10 MEQ tablet Take 1 tablet (10 mEq total) by mouth daily. 12/23/19 12/17/20  [name], MD  rosuvastatin (CRESTOR) 20 MG tablet Take 1 tablet (20 mg total) by mouth daily. 06/23/19   [name], MD  tretinoin (RETIN-A) 0.1 % cream Apply 1 application topically 3 (three) times a week.  01/18/09   [provider]  Wheat Dextrin (BENEFIBER DRINK MIX PO) Take 10 mLs by mouth every morning. Mix with 1/2 a glass water,    [provider]     Positive ROS: Otherwise negative  All other systems have been reviewed and were otherwise negative with the exception of those mentioned in the HPI and as above.  Physical Exam: Constitutional: Alert, well-appearing, no acute distress Ears: External ears without lesions or tenderness.  She has slight moisture in both ear canals with wax adjacent to both TMs.  This was cleaned with suction.  After cleaning the ear canals I applied alcohol and CSF powder to both ear canals.  TMs are clear bilaterally with good mobility on pneumatic otoscopy. Nasal: External nose without lesions. Septum slightly deviated to the left.  She has a prominent scab on the right side of the septum..  Oral: Lips and gums without lesions. Tongue and palate mucosa without lesions. Posterior oropharynx clear. Neck: No palpable adenopathy or masses Respiratory: Breathing comfortably  Skin: No facial/neck lesions or rash noted.  Cerumen impaction removal  Date/Time: 02/22/2020 11:00 AM Performed by: Rozetta Nunnery, MD Authorized by: Rozetta Nunnery, MD   Consent:    Consent obtained:  Verbal   Consent given by:  Patient   Risks discussed:  Pain and bleeding Procedure details:    Location:  L ear and R ear   Procedure type: suction   Post-procedure details:    Inspection:  TM intact and  canal normal   Hearing quality:  Improved   Patient tolerance of procedure:   Tolerated well, no immediate complications Comments:     Both TMs are clear.    Assessment: Wax buildup in both ear canals that was cleaned with suction. Occasional itching in the ears. History of right-sided epistaxis over a month ago.  Plan: Ear canals were cleaned in the office and the TMs were clear bilaterally.  I applied CSF powder to both ear canals.  Because of itching. Also discussed with her concerning use of alcohol vinegar ear rinses as needed itching x3 days. Also reviewed with her concerning use of a cottonball and Afrin if she has any further nosebleeds.  She will follow up here as needed if she has recurrent nosebleeds for cauterization if needed.  Radene Journey, MD

## 2020-02-24 DIAGNOSIS — Z01419 Encounter for gynecological examination (general) (routine) without abnormal findings: Secondary | ICD-10-CM | POA: Diagnosis not present

## 2020-02-24 DIAGNOSIS — N760 Acute vaginitis: Secondary | ICD-10-CM | POA: Diagnosis not present

## 2020-02-27 ENCOUNTER — Encounter: Payer: Self-pay | Admitting: Emergency Medicine

## 2020-02-27 ENCOUNTER — Ambulatory Visit (INDEPENDENT_AMBULATORY_CARE_PROVIDER_SITE_OTHER): Payer: Medicare Other | Admitting: Emergency Medicine

## 2020-02-27 ENCOUNTER — Other Ambulatory Visit: Payer: Self-pay

## 2020-02-27 DIAGNOSIS — I2510 Atherosclerotic heart disease of native coronary artery without angina pectoris: Secondary | ICD-10-CM | POA: Diagnosis not present

## 2020-02-27 DIAGNOSIS — R911 Solitary pulmonary nodule: Secondary | ICD-10-CM | POA: Diagnosis not present

## 2020-02-27 DIAGNOSIS — IMO0001 Reserved for inherently not codable concepts without codable children: Secondary | ICD-10-CM

## 2020-02-27 NOTE — Assessment & Plan Note (Signed)
Slowly enlarging groundglass pulmonary nodule now with a more solid component.  Suspect that this is a well-differentiated lung adenocarcinoma, very unlikely to be related to her history of breast cancer.  We talked about the pros and cons of possible bronchoscopy and tissue diagnosis to help guide subsequent therapy, probably SBRT.  She does want to proceed if possible.  I will review with Dr. Burt Knack with cardiology to determine her cardiac risk, get recommendations regarding holding her Eliquis.  Once I have this information we can talk about scheduling.  Possibly on 11/9.

## 2020-02-27 NOTE — Patient Instructions (Signed)
We work on setting up a navigational bronchoscopy to further evaluate your pulmonary nodule. We will discuss your case in your anticoagulation with Dr. Burt Knack in Cardiology, follow his recommendations regarding any preoperative care, medication. Follow with Dr Lamonte Sakai in 1 month

## 2020-02-27 NOTE — Progress Notes (Signed)
   Subjective:    Patient ID: [name], female    DOB: [birth year], 86 y.o.   MRN: [id number]  HPI  86 year old former smoker (10 pack years) with a history of breast cancer, AVR for severe aortic stenosis, CAD, A fib (amio, eliquis) with associated diastolic CHF,  allergies, GERD with hiatal hernia.  She has been undergoing serial CT scans of the chest to follow a rounded groundglass right upper lobe pulmonary nodule.  It looks like this was first discovered on a CT chest 09/25/2016 performed to evaluate a chest wall lipoma.  I have reviewed all of her CTs.  There has been slow increase in size in the pulmonary nodule and there is now a small solid component.  Most recent CT shows that the largest diameter is now 1.2 cm with a 4 mm more solid area.  She is referred today to discuss evaluation of the nodule.   She reports that she is doing well, denies SOB, CP, cough.   Pulmonary function testing done 11/14/2014 reviewed by me, shows mild obstruction without a bronchodilator response, normal lung volumes and a decreased diffusion capacity.  ROV 02/27/20 --this is a follow-up visit for 86 year old woman with a remote small tobacco history, breast cancer, AVR, CAD, A. fib with associated diastolic dysfunction.  She has been followed with serial CT scans for slowly enlarging rounded right upper lobe groundglass nodule.  Most recent CT 01/06/2020 showed that it was 1.2 cm with a 4 mm more solid area, consistent with a slow-growing adenocarcinoma.  We discussed the pros and cons of possible bronchoscopy for tissue diagnosis to plan therapeutics.  Currently managed on Eliquis.    Review of Systems As per HPI      Objective:   Physical Exam Vitals:   02/27/20 0951  BP: (!) 150/68  Pulse: (!) 54  Temp: (!) 97.4 F (36.3 C)  TempSrc: Temporal  SpO2: 97%  Weight: 158 lb 12.8 oz (72 kg)  Height: 5\' 5"  (1.651 m)   Gen: Pleasant, well-nourished, in no distress,  normal affect  ENT: No  lesions,  mouth clear,  oropharynx clear, no postnasal drip  Neck: No JVD, no stridor  Lungs: No use of accessory muscles, no crackles or wheezing on normal respiration, no wheeze on forced expiration  Cardiovascular: RRR, heart sounds normal, no murmur or gallops, no peripheral edema  Musculoskeletal: No deformities, no cyanosis or clubbing  Neuro: alert, awake, non focal  Skin: Warm, no lesions or rash       Assessment & Plan:  Lung nodule < 6cm on CT Slowly enlarging groundglass pulmonary nodule now with a more solid component.  Suspect that this is a well-differentiated lung adenocarcinoma, very unlikely to be related to her history of breast cancer.  We talked about the pros and cons of possible bronchoscopy and tissue diagnosis to help guide subsequent therapy, probably SBRT.  She does want to proceed if possible.  I will review with Dr. Burt Knack with cardiology to determine her cardiac risk, get recommendations regarding holding her Eliquis.  Once I have this information we can talk about scheduling.  Possibly on 11/9.   Baltazar Apo, MD, PhD 02/27/2020, 12:22 PM [facility] [phone] or if no answer [phone]

## 2020-02-27 NOTE — H&P (View-Only) (Signed)
° °  Subjective:    Patient ID: [name], female    DOB: [birth year], 86 y.o.   MRN: [id number]  HPI  86 year old former smoker (10 pack years) with a history of breast cancer, AVR for severe aortic stenosis, CAD, A fib (amio, eliquis) with associated diastolic CHF,  allergies, GERD with hiatal hernia.  She has been undergoing serial CT scans of the chest to follow a rounded groundglass right upper lobe pulmonary nodule.  It looks like this was first discovered on a CT chest 09/25/2016 performed to evaluate a chest wall lipoma.  I have reviewed all of her CTs.  There has been slow increase in size in the pulmonary nodule and there is now a small solid component.  Most recent CT shows that the largest diameter is now 1.2 cm with a 4 mm more solid area.  She is referred today to discuss evaluation of the nodule.   She reports that she is doing well, denies SOB, CP, cough.   Pulmonary function testing done 11/14/2014 reviewed by me, shows mild obstruction without a bronchodilator response, normal lung volumes and a decreased diffusion capacity.  ROV 02/27/20 --this is a follow-up visit for 86 year old woman with a remote small tobacco history, breast cancer, AVR, CAD, A. fib with associated diastolic dysfunction.  She has been followed with serial CT scans for slowly enlarging rounded right upper lobe groundglass nodule.  Most recent CT 01/06/2020 showed that it was 1.2 cm with a 4 mm more solid area, consistent with a slow-growing adenocarcinoma.  We discussed the pros and cons of possible bronchoscopy for tissue diagnosis to plan therapeutics.  Currently managed on Eliquis.    Review of Systems As per HPI      Objective:   Physical Exam Vitals:   02/27/20 0951  BP: (!) 150/68  Pulse: (!) 54  Temp: (!) 97.4 F (36.3 C)  TempSrc: Temporal  SpO2: 97%  Weight: 158 lb 12.8 oz (72 kg)  Height: 5\' 5"  (1.651 m)   Gen: Pleasant, well-nourished, in no distress,  normal affect  ENT: No  lesions,  mouth clear,  oropharynx clear, no postnasal drip  Neck: No JVD, no stridor  Lungs: No use of accessory muscles, no crackles or wheezing on normal respiration, no wheeze on forced expiration  Cardiovascular: RRR, heart sounds normal, no murmur or gallops, no peripheral edema  Musculoskeletal: No deformities, no cyanosis or clubbing  Neuro: alert, awake, non focal  Skin: Warm, no lesions or rash       Assessment & Plan:  Lung nodule < 6cm on CT Slowly enlarging groundglass pulmonary nodule now with a more solid component.  Suspect that this is a well-differentiated lung adenocarcinoma, very unlikely to be related to her history of breast cancer.  We talked about the pros and cons of possible bronchoscopy and tissue diagnosis to help guide subsequent therapy, probably SBRT.  She does want to proceed if possible.  I will review with Dr. Burt Knack with cardiology to determine her cardiac risk, get recommendations regarding holding her Eliquis.  Once I have this information we can talk about scheduling.  Possibly on 11/9.   Baltazar Apo, MD, PhD 02/27/2020, 12:22 PM [facility] [phone] or if no answer [phone]

## 2020-02-28 ENCOUNTER — Other Ambulatory Visit: Payer: Self-pay | Admitting: Internal Medicine

## 2020-02-28 ENCOUNTER — Other Ambulatory Visit: Payer: Self-pay | Admitting: Emergency Medicine

## 2020-02-28 DIAGNOSIS — R911 Solitary pulmonary nodule: Secondary | ICD-10-CM

## 2020-02-28 DIAGNOSIS — IMO0001 Reserved for inherently not codable concepts without codable children: Secondary | ICD-10-CM

## 2020-02-29 ENCOUNTER — Telehealth: Payer: Self-pay | Admitting: Cardiovascular Disease

## 2020-02-29 NOTE — Telephone Encounter (Signed)
Patient is following up regarding having biopsy (see patient message from 02/27/20). Made her aware that Dr. Burt Knack has reviewed her message and replied. She requested an additional call from Dr. Antionette Char nurse to further discuss.

## 2020-02-29 NOTE — Telephone Encounter (Signed)
Called to catch up with the patient. She states her MyChart notifications were going to her junk mail but she knows to look there now. She will stop aspirin and metoprolol as instructions in messages stated. She states she spoke with Dr. Burt Knack and was very appreciative for his time and call.

## 2020-03-02 ENCOUNTER — Telehealth: Payer: Self-pay | Admitting: Emergency Medicine

## 2020-03-02 NOTE — Telephone Encounter (Signed)
Spoke with the pt  She is asking for directions for prep before bronch  I called schedulers at [phone] and left detailed msg with the pt's number for them to call her asap  Pt is aware to expect a call and I have given her the phone number as well  Nothing further needed

## 2020-03-03 ENCOUNTER — Other Ambulatory Visit (HOSPITAL_COMMUNITY)
Admission: RE | Admit: 2020-03-03 | Discharge: 2020-03-03 | Disposition: A | Discharge status: Home / Self Care | Payer: Medicare Other | Source: Ambulatory Visit | Attending: Emergency Medicine | Admitting: Emergency Medicine

## 2020-03-03 DIAGNOSIS — Z01812 Encounter for preprocedural laboratory examination: Secondary | ICD-10-CM | POA: Insufficient documentation

## 2020-03-03 DIAGNOSIS — Z20822 Contact with and (suspected) exposure to covid-19: Secondary | ICD-10-CM | POA: Diagnosis not present

## 2020-03-03 LAB — SARS CORONAVIRUS 2 (TAT 6-24 HRS): SARS Coronavirus 2: NEGATIVE

## 2020-03-05 ENCOUNTER — Encounter: Payer: Self-pay | Admitting: Internal Medicine

## 2020-03-05 ENCOUNTER — Encounter (HOSPITAL_COMMUNITY): Payer: Self-pay | Admitting: Emergency Medicine

## 2020-03-05 ENCOUNTER — Telehealth: Payer: Self-pay | Admitting: Cardiovascular Disease

## 2020-03-05 NOTE — Anesthesia Preprocedure Evaluation (Deleted)
Anesthesia Evaluation    Airway        Dental   Pulmonary former smoker,           Cardiovascular hypertension,      Neuro/Psych    GI/Hepatic   Endo/Other    Renal/GU      Musculoskeletal   Abdominal   Peds  Hematology   Anesthesia Other Findings   Reproductive/Obstetrics                            Anesthesia Physical Anesthesia Plan  ASA:   Anesthesia Plan:    Post-op Pain Management:    Induction:   PONV Risk Score and Plan:   Airway Management Planned:   Additional Equipment:   Intra-op Plan:   Post-operative Plan:   Informed Consent:   Plan Discussed with:   Anesthesia Plan Comments: (PAT note by Karoline Caldwell, PA-C: Follows with cardiology for hx of AS s/p bioprosthetic AVR 2016 (now with stable mil-mod perivalvular regurgitation), CAD s/p NSTEMI 01/2019 (total occlusion of distal LAD felt to be prob embolic event), Afib s/p TEE-DCCV 11/2019.  Last seen by Dr. Burt Knack 01/23/20. Per note, maintaining SR s/p cardioversion, overall stable from cardiac standpoint. Event monitor 02/14/20 showed no afib/flutter, baseline rhythm sinus bradycardia rate 49.  Her metoprolol was subsequently discontinued due to bradycardia on both amiodarone and bblocker.   Pt cleared to by cardiology to hold Eliquis 2d preop.   We will need day of surgery labs and evaluation.  EKG 01/23/2020: Sinus bradycardia.  Rate 52.  Occasional PAC.  Event monitor 02/14/20: The basic rhythm is sinus bradycardia with an average HR of 49 bpm No atrial fibrillation or flutter No high-grade heart block or pathologic pauses There are rare PVC's and occasional supraventricular beats without sustained arrhythmias.  PAC burden is 2.2%.  TTE 01/20/20: 1. Left ventricular ejection fraction, by estimation, is 55 to 60%. The  left ventricle has normal function. The left ventricle has no regional  wall motion  abnormalities. There is mild left ventricular hypertrophy.  Left ventricular diastolic parameters  are consistent with Grade II diastolic dysfunction (pseudonormalization).  2. Right ventricular systolic function is mildly reduced. The right  ventricular size is moderately enlarged. There is mildly elevated  pulmonary artery systolic pressure. The estimated right ventricular  systolic pressure is 20.9 mmHg.  3. Left atrial size was moderately dilated.  4. Right atrial size was mildly dilated.  5. The mitral valve is normal in structure. No evidence of mitral valve  regurgitation. No evidence of mitral stenosis.  6. Bioprosthetic aortic valve. Mean gradient 11 mmHg, not significantly  elevated. There is at least mild paravalvular leakage, this may not be  fully visualized.  7. The inferior vena cava is dilated in size with >50% respiratory  variability, suggesting right atrial pressure of 8 mmHg.   LHC 02/04/2019: 1. Severe single-vessel coronary artery disease with occlusion of the distal LAD.  Otherwise, there is mild disease involving the proximal LAD and ostial diagonal branch.  Question if this represents acute plaque rupture versus coronary embolism. 2. No significant CAD involving the LCx and RCA.  Recommendations: 1. Medical therapy including dual antiplatelet therapy with aspirin and clopidogrel for up to 12 months.  Distal LAD at site of occlusion is very small and not well-suited for stent placement. 2. Aggressive secondary prevention. 3. Follow-up echocardiogram with particular attention to aortic valve prosthesis to exclude thrombus. )  Anesthesia Quick Evaluation

## 2020-03-05 NOTE — Telephone Encounter (Signed)
Follow Up:    Please call Jeneen Rinks, not clear on the note that was put in Epic on 03-02-20

## 2020-03-05 NOTE — Progress Notes (Signed)
Spoke with pt for pre-op call. Pt has hx of A-fib and is on Eliquis, Dr. Burt Knack instructed her to hold 2 days prior to surgery. Last dose was 03/03/20 pm dose.   Covid test done on 03/03/20 and it's negative. Pt states she has been in quarantine since the test was done and understands the she is stays in quarantine until she comes to the hospital.

## 2020-03-05 NOTE — Progress Notes (Signed)
Anesthesia Chart Review:  Follows with cardiology for hx of AS s/p bioprosthetic AVR 2016 (now with stable mil-mod perivalvular regurgitation), CAD s/p NSTEMI 01/2019 (total occlusion of distal LAD felt to be prob embolic event), Afib s/p TEE-DCCV 11/2019.  Last seen by Dr. Burt Knack 01/23/20. Per note, maintaining SR s/p cardioversion, overall stable from cardiac standpoint. Event monitor 02/14/20 showed no afib/flutter, baseline rhythm sinus bradycardia rate 49.  Her metoprolol was subsequently discontinued due to bradycardia on both amiodarone and bblocker.   Pt cleared to by cardiology to hold Eliquis 2d preop.   We will need day of surgery labs and evaluation.  EKG 01/23/2020: Sinus bradycardia.  Rate 52.  Occasional PAC.  Event monitor 02/14/20: 1. The basic rhythm is sinus bradycardia with an average HR of 49 bpm 2. No atrial fibrillation or flutter 3. No high-grade heart block or pathologic pauses 4. There are rare PVC's and occasional supraventricular beats without sustained arrhythmias.  PAC burden is 2.2%.  TTE 01/20/20: 1. Left ventricular ejection fraction, by estimation, is 55 to 60%. The  left ventricle has normal function. The left ventricle has no regional  wall motion abnormalities. There is mild left ventricular hypertrophy.  Left ventricular diastolic parameters  are consistent with Grade II diastolic dysfunction (pseudonormalization).  2. Right ventricular systolic function is mildly reduced. The right  ventricular size is moderately enlarged. There is mildly elevated  pulmonary artery systolic pressure. The estimated right ventricular  systolic pressure is 54.4 mmHg.  3. Left atrial size was moderately dilated.  4. Right atrial size was mildly dilated.  5. The mitral valve is normal in structure. No evidence of mitral valve  regurgitation. No evidence of mitral stenosis.  6. Bioprosthetic aortic valve. Mean gradient 11 mmHg, not significantly  elevated. There is  at least mild paravalvular leakage, this may not be  fully visualized.  7. The inferior vena cava is dilated in size with >50% respiratory  variability, suggesting right atrial pressure of 8 mmHg.   LHC 02/04/2019: 1. Severe single-vessel coronary artery disease with occlusion of the distal LAD.  Otherwise, there is mild disease involving the proximal LAD and ostial diagonal branch.  Question if this represents acute plaque rupture versus coronary embolism. 2. No significant CAD involving the LCx and RCA.  Recommendations: 1. Medical therapy including dual antiplatelet therapy with aspirin and clopidogrel for up to 12 months.  Distal LAD at site of occlusion is very small and not well-suited for stent placement. 2. Aggressive secondary prevention. 3. Follow-up echocardiogram with particular attention to aortic valve prosthesis to exclude thrombus.   Wynonia Musty [facility] Short Stay Center/Anesthesiology Phone [phone] 03/05/2020 11:48 AM'

## 2020-03-05 NOTE — Telephone Encounter (Signed)
   Primary Cardiologist: [name], MD  Chart reviewed as part of pre-operative protocol coverage.   Spoke with Jeneen Rinks, preop PA in regards to her upcoming bronchoscopy. Appears there was a telephone conversation between Ms. Kerkhoff and Dr. Burt Knack. She confirms she was instructed to hold her eliquis 2 days prior to her upcoming procedure. Recommend restarting eliquis as soon as she is cleared to do so by her pulmonologist.   I will route this recommendation to the requesting party via [place] fax function and remove from pre-op pool.  Please call with questions.  [name], PA-C 03/05/2020, 11:30 AM

## 2020-03-06 ENCOUNTER — Encounter (HOSPITAL_COMMUNITY)
Admission: RE | Disposition: A | Discharge status: Home / Self Care | Payer: Self-pay | Source: Home / Self Care | Attending: Emergency Medicine

## 2020-03-06 ENCOUNTER — Ambulatory Visit (HOSPITAL_COMMUNITY): Payer: Medicare Other

## 2020-03-06 ENCOUNTER — Ambulatory Visit (HOSPITAL_COMMUNITY): Payer: Medicare Other | Admitting: Physician Assistant

## 2020-03-06 ENCOUNTER — Ambulatory Visit (HOSPITAL_COMMUNITY)
Admission: RE | Admit: 2020-03-06 | Discharge: 2020-03-06 | Disposition: A | Discharge status: Home / Self Care | Payer: Medicare Other | Attending: Emergency Medicine | Admitting: Emergency Medicine

## 2020-03-06 ENCOUNTER — Encounter (HOSPITAL_COMMUNITY): Payer: Self-pay | Admitting: Emergency Medicine

## 2020-03-06 ENCOUNTER — Other Ambulatory Visit: Payer: Self-pay

## 2020-03-06 DIAGNOSIS — I5030 Unspecified diastolic (congestive) heart failure: Secondary | ICD-10-CM | POA: Diagnosis not present

## 2020-03-06 DIAGNOSIS — Z9889 Other specified postprocedural states: Secondary | ICD-10-CM

## 2020-03-06 DIAGNOSIS — J984 Other disorders of lung: Secondary | ICD-10-CM | POA: Diagnosis not present

## 2020-03-06 DIAGNOSIS — K219 Gastro-esophageal reflux disease without esophagitis: Secondary | ICD-10-CM | POA: Insufficient documentation

## 2020-03-06 DIAGNOSIS — Z853 Personal history of malignant neoplasm of breast: Secondary | ICD-10-CM | POA: Insufficient documentation

## 2020-03-06 DIAGNOSIS — R911 Solitary pulmonary nodule: Secondary | ICD-10-CM | POA: Diagnosis not present

## 2020-03-06 DIAGNOSIS — Z953 Presence of xenogenic heart valve: Secondary | ICD-10-CM | POA: Diagnosis not present

## 2020-03-06 DIAGNOSIS — E785 Hyperlipidemia, unspecified: Secondary | ICD-10-CM | POA: Diagnosis not present

## 2020-03-06 DIAGNOSIS — I4891 Unspecified atrial fibrillation: Secondary | ICD-10-CM | POA: Diagnosis not present

## 2020-03-06 DIAGNOSIS — R918 Other nonspecific abnormal finding of lung field: Secondary | ICD-10-CM | POA: Diagnosis not present

## 2020-03-06 DIAGNOSIS — I2510 Atherosclerotic heart disease of native coronary artery without angina pectoris: Secondary | ICD-10-CM | POA: Diagnosis not present

## 2020-03-06 DIAGNOSIS — I214 Non-ST elevation (NSTEMI) myocardial infarction: Secondary | ICD-10-CM | POA: Diagnosis not present

## 2020-03-06 DIAGNOSIS — I252 Old myocardial infarction: Secondary | ICD-10-CM | POA: Insufficient documentation

## 2020-03-06 DIAGNOSIS — R846 Abnormal cytological findings in specimens from respiratory organs and thorax: Secondary | ICD-10-CM | POA: Diagnosis not present

## 2020-03-06 DIAGNOSIS — Z87891 Personal history of nicotine dependence: Secondary | ICD-10-CM | POA: Diagnosis not present

## 2020-03-06 DIAGNOSIS — C3491 Malignant neoplasm of unspecified part of right bronchus or lung: Secondary | ICD-10-CM

## 2020-03-06 DIAGNOSIS — C3411 Malignant neoplasm of upper lobe, right bronchus or lung: Secondary | ICD-10-CM | POA: Insufficient documentation

## 2020-03-06 HISTORY — DX: Cardiac arrhythmia, unspecified: I49.9

## 2020-03-06 HISTORY — DX: Constipation, unspecified: K59.00

## 2020-03-06 HISTORY — PX: BRONCHIAL NEEDLE ASPIRATION BIOPSY: SHX5106

## 2020-03-06 HISTORY — PX: FIDUCIAL MARKER PLACEMENT: SHX6858

## 2020-03-06 HISTORY — PX: BRONCHIAL BRUSHINGS: SHX5108

## 2020-03-06 HISTORY — PX: VIDEO BRONCHOSCOPY WITH ENDOBRONCHIAL NAVIGATION: SHX6175

## 2020-03-06 HISTORY — PX: BRONCHIAL WASHINGS: SHX5105

## 2020-03-06 HISTORY — DX: Essential (primary) hypertension: I10

## 2020-03-06 HISTORY — PX: BRONCHIAL BIOPSY: SHX5109

## 2020-03-06 LAB — COMPREHENSIVE METABOLIC PANEL
ALT: 36 U/L (ref 0–44)
AST: 39 U/L (ref 15–41)
Albumin: 3.9 g/dL (ref 3.5–5.0)
Alkaline Phosphatase: 95 U/L (ref 38–126)
Anion gap: 9 (ref 5–15)
BUN: 18 mg/dL (ref 8–23)
CO2: 25 mmol/L (ref 22–32)
Calcium: 9 mg/dL (ref 8.9–10.3)
Chloride: 104 mmol/L (ref 98–111)
Creatinine, Ser: 0.97 mg/dL (ref 0.44–1.00)
GFR, Estimated: 57 mL/min — ABNORMAL LOW (ref >60)
Glucose, Bld: 98 mg/dL (ref 70–99)
Potassium: 4 mmol/L (ref 3.5–5.1)
Sodium: 138 mmol/L (ref 135–145)
Total Bilirubin: 1 mg/dL (ref 0.3–1.2)
Total Protein: 6.2 g/dL — ABNORMAL LOW (ref 6.5–8.1)

## 2020-03-06 LAB — CBC
HCT: 46.7 % — ABNORMAL HIGH (ref 36.0–46.0)
Hemoglobin: 15 g/dL (ref 12.0–15.0)
MCH: 30.5 pg (ref 26.0–34.0)
MCHC: 32.1 g/dL (ref 30.0–36.0)
MCV: 95.1 fL (ref 80.0–100.0)
Platelets: 145 10*3/uL — ABNORMAL LOW (ref 150–400)
RBC: 4.91 MIL/uL (ref 3.87–5.11)
RDW: 15.9 % — ABNORMAL HIGH (ref 11.5–15.5)
WBC: 6.6 10*3/uL (ref 4.0–10.5)
nRBC: 0 % (ref 0.0–0.2)

## 2020-03-06 LAB — PROTIME-INR
INR: 1 (ref 0.8–1.2)
Prothrombin Time: 13.2 seconds (ref 11.4–15.2)

## 2020-03-06 LAB — APTT: aPTT: 25 seconds (ref 24–36)

## 2020-03-06 SURGERY — VIDEO BRONCHOSCOPY WITH ENDOBRONCHIAL NAVIGATION
Anesthesia: General | Laterality: Right

## 2020-03-06 MED ORDER — PROPOFOL 500 MG/50ML IV EMUL
INTRAVENOUS | Status: DC | PRN
  Administered 2020-03-06: 150 ug/kg/min via INTRAVENOUS

## 2020-03-06 MED ORDER — APIXABAN 5 MG PO TABS: 5.0000 mg | ORAL_TABLET | Freq: Two times a day (BID) | ORAL | 6 refills | Status: DC

## 2020-03-06 MED ORDER — SUCCINYLCHOLINE CHLORIDE 200 MG/10ML IV SOSY
PREFILLED_SYRINGE | INTRAVENOUS | Status: DC | PRN
  Administered 2020-03-06: 120 mg via INTRAVENOUS

## 2020-03-06 MED ORDER — ALPRAZOLAM 0.5 MG PO TABS: 0.5000 mg | ORAL_TABLET | Freq: Every day | ORAL | Status: DC | PRN

## 2020-03-06 MED ORDER — ESMOLOL HCL 100 MG/10ML IV SOLN
INTRAVENOUS | Status: DC | PRN
  Administered 2020-03-06 (×2): 20 mg via INTRAVENOUS

## 2020-03-06 MED ORDER — PHENYLEPHRINE HCL-NACL 10-0.9 MG/250ML-% IV SOLN
INTRAVENOUS | Status: DC | PRN
  Administered 2020-03-06: 30 ug/min via INTRAVENOUS

## 2020-03-06 MED ORDER — LACTATED RINGERS IV SOLN: INTRAVENOUS | Status: DC

## 2020-03-06 MED ORDER — PROMETHAZINE HCL 25 MG/ML IJ SOLN: 6.2500 mg | INTRAMUSCULAR | Status: DC | PRN

## 2020-03-06 MED ORDER — FENTANYL CITRATE (PF) 250 MCG/5ML IJ SOLN
INTRAMUSCULAR | Status: DC | PRN
  Administered 2020-03-06 (×2): 50 ug via INTRAVENOUS

## 2020-03-06 MED ORDER — CHLORHEXIDINE GLUCONATE 0.12 % MT SOLN
15.0000 mL | Freq: Once | OROMUCOSAL | Status: AC
  Administered 2020-03-06: 15 mL via OROMUCOSAL

## 2020-03-06 MED ORDER — CHLORAL HYDRATE CRYS
10.0000 mL | CRYSTALS | Freq: Every evening | Status: DC | PRN
Start: 2020-03-06 — End: 2020-06-29

## 2020-03-06 MED ORDER — ONDANSETRON HCL 4 MG/2ML IJ SOLN
INTRAMUSCULAR | Status: DC | PRN
  Administered 2020-03-06: 4 mg via INTRAVENOUS

## 2020-03-06 MED ORDER — ACETAMINOPHEN 500 MG PO TABS
1000.0000 mg | ORAL_TABLET | Freq: Once | ORAL | Status: AC
  Administered 2020-03-06: 1000 mg via ORAL
  Filled 2020-03-06: qty 2

## 2020-03-06 MED ORDER — SUGAMMADEX SODIUM 200 MG/2ML IV SOLN
INTRAVENOUS | Status: DC | PRN
  Administered 2020-03-06: 200 mg via INTRAVENOUS

## 2020-03-06 MED ORDER — PROPOFOL 10 MG/ML IV BOLUS
INTRAVENOUS | Status: DC | PRN
  Administered 2020-03-06: 170 mg via INTRAVENOUS

## 2020-03-06 MED ORDER — FUROSEMIDE 20 MG PO TABS: 10.0000 mg | ORAL_TABLET | Freq: Every day | ORAL | Status: DC

## 2020-03-06 MED ORDER — FLUTICASONE PROPIONATE 50 MCG/ACT NA SUSP: 2.0000 | Freq: Every day | NASAL | Status: DC | PRN

## 2020-03-06 MED ORDER — MONTELUKAST SODIUM 10 MG PO TABS
10.0000 mg | ORAL_TABLET | Freq: Every day | ORAL | Status: DC | PRN
Start: 2020-03-06 — End: 2020-04-23

## 2020-03-06 MED ORDER — PHENYLEPHRINE 40 MCG/ML (10ML) SYRINGE FOR IV PUSH (FOR BLOOD PRESSURE SUPPORT)
PREFILLED_SYRINGE | INTRAVENOUS | Status: DC | PRN
  Administered 2020-03-06 (×2): 120 ug via INTRAVENOUS

## 2020-03-06 MED ORDER — FENTANYL CITRATE (PF) 100 MCG/2ML IJ SOLN: 25.0000 ug | INTRAMUSCULAR | Status: DC | PRN

## 2020-03-06 MED ORDER — DEXAMETHASONE SODIUM PHOSPHATE 10 MG/ML IJ SOLN
INTRAMUSCULAR | Status: DC | PRN
  Administered 2020-03-06: 5 mg via INTRAVENOUS

## 2020-03-06 MED ORDER — HYDROCODONE-HOMATROPINE 5-1.5 MG/5ML PO SYRP: 5.0000 mL | ORAL_SOLUTION | Freq: Three times a day (TID) | ORAL | Status: DC | PRN

## 2020-03-06 MED ORDER — ROCURONIUM BROMIDE 10 MG/ML (PF) SYRINGE
PREFILLED_SYRINGE | INTRAVENOUS | Status: DC | PRN
  Administered 2020-03-06: 50 mg via INTRAVENOUS

## 2020-03-06 MED ORDER — LIDOCAINE 2% (20 MG/ML) 5 ML SYRINGE
INTRAMUSCULAR | Status: DC | PRN
  Administered 2020-03-06: 80 mg via INTRAVENOUS

## 2020-03-06 SURGICAL SUPPLY — 1 items: Covidien SuperLock Fiducial ×9 IMPLANT

## 2020-03-06 NOTE — Anesthesia Procedure Notes (Signed)
Procedure Name: Intubation Date/Time: 03/06/2020 12:14 PM Performed by: Griffin Dakin, CRNA Pre-anesthesia Checklist: Patient identified, Emergency Drugs available, Suction available and Patient being monitored Patient Re-evaluated:Patient Re-evaluated prior to induction Oxygen Delivery Method: Circle system utilized Preoxygenation: Pre-oxygenation with 100% oxygen Induction Type: IV induction Laryngoscope Size: Glidescope and 3 Grade View: Grade I Tube type: Oral Tube size: 8.5 mm Number of attempts: 1 Airway Equipment and Method: Video-laryngoscopy and Rigid stylet Placement Confirmation: ETT inserted through vocal cords under direct vision,  positive ETCO2 and breath sounds checked- equal and bilateral Secured at: 22 cm Tube secured with: Tape Dental Injury: Teeth and Oropharynx as per pre-operative assessment

## 2020-03-06 NOTE — Op Note (Signed)
Video Bronchoscopy with Electromagnetic Navigation Procedure Note  Date of Operation: 03/06/2020  Pre-op Diagnosis: Right upper lobe pulmonary nodule  Post-op Diagnosis: Same  Surgeon: Baltazar Apo  Assistants: None  Anesthesia: General endotracheal anesthesia  Operation: Flexible video fiberoptic bronchoscopy with electromagnetic navigation and biopsies.  Estimated Blood Loss: Minimal  Complications: None apparent  Indications and History: [name] is a 86 y.o. female with history of former tobacco, breast cancer, cardiac disease.  She has a slowly growing rounded groundglass right upper lobe pulmonary nodule that is enlarging on CT scans of the chest going back to May 2018.  She presents today to achieve a tissue diagnosis via navigational bronchoscopy.  The risks, benefits, complications, treatment options and expected outcomes were discussed with the patient.  The possibilities of pneumothorax, pneumonia, reaction to medication, pulmonary aspiration, perforation of a viscus, bleeding, failure to diagnose a condition and creating a complication requiring transfusion or operation were discussed with the patient who freely signed the consent.    Description of Procedure: The patient was seen in the Preoperative Area, was examined and was deemed appropriate to proceed.  The patient was taken to [facility] endoscopy room 2, identified as [name] and the procedure verified as Flexible Video Fiberoptic Bronchoscopy.  A Time Out was held and the above information confirmed.   Prior to the date of the procedure a high-resolution CT scan of the chest was performed. Utilizing [place] a virtual tracheobronchial tree was generated to allow the creation of distinct navigation pathways to the patient's parenchymal abnormalities. After being taken to the operating room general anesthesia was initiated and the patient  was orally intubated. The video fiberoptic  bronchoscope was introduced via the endotracheal tube and a general inspection was performed which showed normal airways throughout.  There were no endobronchial lesions or abnormal secretions seen. The extendable working channel and locator guide were introduced into the bronchoscope. The distinct navigation pathways prepared prior to this procedure were then utilized to navigate to within 0.3 cm of patient's right upper lobe nodule identified on CT scan. The extendable working channel was secured into place and the locator guide was withdrawn. Under fluoroscopic guidance transbronchial needle brushings, transbronchial Wang needle biopsies, and transbronchial forceps biopsies were performed to be sent for cytology and pathology.  Three fiducial markers were placed triangulating the pulmonary nodule under fluoroscopic guidance to facilitate radiation therapy should this become necessary going forward.  A bronchioalveolar lavage was performed in the right upper lobe and sent for cytology. At the end of the procedure a general airway inspection was performed and there was no evidence of active bleeding. The bronchoscope was removed.  The patient tolerated the procedure well. There was no significant blood loss and there were no obvious complications. A post-procedural chest x-ray is pending.  Samples: 1. Transbronchial needle brushings from right upper lobe nodule 2. Transbronchial Wang needle biopsies from right upper lobe nodule 3. Transbronchial forceps biopsies from right upper lobe nodule 4. Bronchoalveolar lavage from right upper lobe  Plans:  The patient will be discharged from the PACU to home when recovered from anesthesia and after chest x-ray is reviewed. We will review the cytology, pathology and microbiology results with the patient when they become available. Outpatient followup will be with Dr. Lamonte Sakai.   Baltazar Apo, MD, PhD 03/06/2020, 1:13 PM [facility] [phone] or if no answer [phone]

## 2020-03-06 NOTE — Discharge Instructions (Signed)
Flexible Bronchoscopy, Care After This sheet gives you information about how to care for yourself after your test. Your doctor may also give you more specific instructions. If you have problems or questions, contact your doctor. Follow these instructions at home: Eating and drinking  Do not eat or drink anything (not even water) for 2 hours after your test, or until your numbing medicine (local anesthetic) wears off.  When your numbness is gone and your cough and gag reflexes have come back, you may: ? Eat only soft foods. ? Slowly drink liquids.  The day after the test, go back to your normal diet. Driving  Do not drive for 24 hours if you were given a medicine to help you relax (sedative).  Do not drive or use heavy machinery while taking prescription pain medicine. General instructions   Take over-the-counter and prescription medicines only as told by your doctor.  Return to your normal activities as told. Ask what activities are safe for you.  Do not use any products that have nicotine or tobacco in them. This includes cigarettes and e-cigarettes. If you need help quitting, ask your doctor.  Keep all follow-up visits as told by your doctor. This is important. It is very important if you had a tissue sample (biopsy) taken. Get help right away if:  You have shortness of breath that gets worse.  You get light-headed.  You feel like you are going to pass out (faint).  You have chest pain.  You cough up: ? More than a little blood. ? More blood than before. Summary  Do not eat or drink anything (not even water) for 2 hours after your test, or until your numbing medicine wears off.  Do not use cigarettes. Do not use e-cigarettes.  Get help right away if you have chest pain.  Restart your Eliquis on 03/07/2020  Please call our office for any questions or concerns.  [phone].  This information is not intended to replace advice given to you by your health care  provider. Make sure you discuss any questions you have with your health care provider. Document Revised: 03/27/2017 Document Reviewed: 05/02/2016 Elsevier Patient Education  2020 [employer].

## 2020-03-06 NOTE — Anesthesia Preprocedure Evaluation (Addendum)
Anesthesia Evaluation  Patient identified by MRN, date of birth, ID band Patient awake    Reviewed: Allergy & Precautions, NPO status , Patient's Chart, lab work & pertinent test results  History of Anesthesia Complications Negative for: history of anesthetic complications  Airway Mallampati: III  TM Distance: >3 FB Neck ROM: Full    Dental no notable dental hx. (+) Dental Advisory Given   Pulmonary former smoker,    Pulmonary exam normal breath sounds clear to auscultation       Cardiovascular hypertension, Pt. on medications + CAD, + Past MI and + CABG  Normal cardiovascular exam+ dysrhythmias Atrial Fibrillation + Valvular Problems/Murmurs (s/p AVR in 2016) AS  Rhythm:Regular Rate:Normal   1. Extensive 3 D rending done of AVR.  2. No LAA thormbus [place] x 1 200 J converted to NSR.  3. Left ventricular ejection fraction, by estimation, is 55%. The left  ventricle has normal function. The left ventricle has no regional wall  motion abnormalities. There is mild left ventricular hypertrophy. Left  ventricular diastolic function could not  be evaluated.  4. Right ventricular systolic function is moderately reduced. The right  ventricular size is moderately enlarged.  5. Left atrial size was severely dilated. No left atrial/left atrial  appendage thrombus was detected.  6. Right atrial size was severely dilated.  7. The mitral valve is normal in structure. Mild mitral valve  regurgitation.  8. Tricuspid valve regurgitation is moderate to severe.  9. Post AVR 2016 with 21 mm [facility] Ease stented pericardial tissue  valve Mean gradient 8 mmHg Moderate PVL 1:00 on BSA views may be related  to suture dehiscence as she had a bicuspid AV prior to implant . The  aortic valve has been  repaired/replaced. Aortic valve regurgitation is moderate.    Neuro/Psych Anxiety    GI/Hepatic Neg liver ROS, PUD,   Endo/Other   negative endocrine ROS  Renal/GU negative Renal ROS  negative genitourinary   Musculoskeletal   Abdominal   Peds  Hematology  (+) anemia ,   Anesthesia Other Findings   Reproductive/Obstetrics negative OB ROS                           Anesthesia Physical  Anesthesia Plan  ASA: III  Anesthesia Plan: General   Post-op Pain Management:    Induction: Intravenous  PONV Risk Score and Plan: 3 and TIVA, Propofol infusion and Treatment may vary due to age or medical condition  Airway Management Planned: Oral ETT  Additional Equipment:   Intra-op Plan:   Post-operative Plan:   Informed Consent: I have reviewed the patients History and Physical, chart, labs and discussed the procedure including the risks, benefits and alternatives for the proposed anesthesia with the patient or authorized representative who has indicated his/her understanding and acceptance.     Dental advisory given  Plan Discussed with: Anesthesiologist and CRNA  Anesthesia Plan Comments: (PAT note by Karoline Caldwell, PA-C: Follows with cardiology for hx of AS s/p bioprosthetic AVR 2016 (now with stable mil-mod perivalvular regurgitation), CAD s/p NSTEMI 01/2019 (total occlusion of distal LAD felt to be prob embolic event), Afib s/p TEE-DCCV 11/2019.  Last seen by Dr. Burt Knack 01/23/20. Per note, maintaining SR s/p cardioversion, overall stable from cardiac standpoint. Event monitor 02/14/20 showed no afib/flutter, baseline rhythm sinus bradycardia rate 49.  Her metoprolol was subsequently discontinued due to bradycardia on both amiodarone and bblocker.   Pt cleared to by cardiology  to hold Eliquis 2d preop.   We will need day of surgery labs and evaluation.  EKG 01/23/2020: Sinus bradycardia.  Rate 52.  Occasional PAC.  Event monitor 02/14/20: The basic rhythm is sinus bradycardia with an average HR of 49 bpm No atrial fibrillation or flutter No high-grade heart block or pathologic  pauses There are rare PVC's and occasional supraventricular beats without sustained arrhythmias.  PAC burden is 2.2%.  TTE 01/20/20: 1. Left ventricular ejection fraction, by estimation, is 55 to 60%. The  left ventricle has normal function. The left ventricle has no regional  wall motion abnormalities. There is mild left ventricular hypertrophy.  Left ventricular diastolic parameters  are consistent with Grade II diastolic dysfunction (pseudonormalization).  2. Right ventricular systolic function is mildly reduced. The right  ventricular size is moderately enlarged. There is mildly elevated  pulmonary artery systolic pressure. The estimated right ventricular  systolic pressure is 54.5 mmHg.  3. Left atrial size was moderately dilated.  4. Right atrial size was mildly dilated.  5. The mitral valve is normal in structure. No evidence of mitral valve  regurgitation. No evidence of mitral stenosis.  6. Bioprosthetic aortic valve. Mean gradient 11 mmHg, not significantly  elevated. There is at least mild paravalvular leakage, this may not be  fully visualized.  7. The inferior vena cava is dilated in size with >50% respiratory  variability, suggesting right atrial pressure of 8 mmHg.   LHC 02/04/2019: 1. Severe single-vessel coronary artery disease with occlusion of the distal LAD.  Otherwise, there is mild disease involving the proximal LAD and ostial diagonal branch.  Question if this represents acute plaque rupture versus coronary embolism. 2. No significant CAD involving the LCx and RCA.  Recommendations: 1. Medical therapy including dual antiplatelet therapy with aspirin and clopidogrel for up to 12 months.  Distal LAD at site of occlusion is very small and not well-suited for stent placement. 2. Aggressive secondary prevention. 3. Follow-up echocardiogram with particular attention to aortic valve prosthesis to exclude thrombus. )       Anesthesia Quick Evaluation

## 2020-03-06 NOTE — Interval H&P Note (Signed)
PCCM Interval Note  86 year old woman, history of breast cancer, cardiac disease as above, slowly enlarging groundglass right upper lobe pulmonary nodule.  She presents for further evaluation of the nodule via navigational bronchoscopy.  No new issues reported.  She has been off Eliquis for 2 days.  Activity level stable.  Vitals:   03/06/20 0924  BP: (!) 134/97  Pulse: 86  Resp: 17  Temp: 97.8 F (36.6 C)  TempSrc: Oral  SpO2: 99%   CBC Latest Ref Rng & Units 03/06/2020 12/30/2019 12/06/2019  WBC 4.0 - 10.5 K/uL 6.6 - 10.9(H)  Hemoglobin 12.0 - 15.0 g/dL 15.0 15.3(H) 13.6  Hematocrit 36 - 46 % 46.7(H) 45.0 41.8  Platelets 150 - 400 K/uL 145(L) - 176   BMP Latest Ref Rng & Units 03/06/2020 12/30/2019 12/19/2019  Glucose 70 - 99 mg/dL 98 91 129(H)  BUN 8 - 23 mg/dL 18 25(H) 16  Creatinine 0.44 - 1.00 mg/dL 0.97 1.00 0.95  BUN/Creat Ratio 12 - 28 - - 17  Sodium 135 - 145 mmol/L 138 138 139  Potassium 3.5 - 5.1 mmol/L 4.0 4.8 4.5  Chloride 98 - 111 mmol/L 104 102 103  CO2 22 - 32 mmol/L 25 - 20  Calcium 8.9 - 10.3 mg/dL 9.0 - 9.1   INR 1.0 today  Recommendations, plans: Navigational bronchoscopy to evaluate groundglass right upper lobe pulmonary nodule.  Procedure discussed with the patient in full, including risks, benefits.  She understands and agrees.  Baltazar Apo, MD, PhD 03/06/2020, 11:32 AM [facility] [phone] or if no answer [phone]

## 2020-03-06 NOTE — Anesthesia Postprocedure Evaluation (Signed)
Anesthesia Post Note  Patient: [name]  Procedure(s) Performed: VIDEO BRONCHOSCOPY WITH ENDOBRONCHIAL NAVIGATION (Right ) BRONCHIAL BRUSHINGS BRONCHIAL NEEDLE ASPIRATION BIOPSIES BRONCHIAL BIOPSIES BRONCHIAL WASHINGS FIDUCIAL MARKER PLACEMENT     Patient location during evaluation: PACU Anesthesia Type: General Level of consciousness: sedated Pain management: pain level controlled Vital Signs Assessment: post-procedure vital signs reviewed and stable Respiratory status: spontaneous breathing and respiratory function stable Cardiovascular status: stable Postop Assessment: no apparent nausea or vomiting Anesthetic complications: no   No complications documented.  Last Vitals:  Vitals:   03/06/20 1347 03/06/20 1357  BP: (!) 172/96 (!) 145/90  Pulse: (!) 113 (!) 114  Resp: 14 20  Temp:    SpO2: 95% 96%    Last Pain:  Vitals:   03/06/20 1357  TempSrc:   PainSc: 0-No pain                 Sonna Lipsky DANIEL

## 2020-03-06 NOTE — Transfer of Care (Signed)
Immediate Anesthesia Transfer of Care Note  Patient: Alexzandrea Normington Throgmorton  Procedure(s) Performed: VIDEO BRONCHOSCOPY WITH ENDOBRONCHIAL NAVIGATION (Right ) BRONCHIAL BRUSHINGS BRONCHIAL NEEDLE ASPIRATION BIOPSIES BRONCHIAL BIOPSIES BRONCHIAL WASHINGS FIDUCIAL MARKER PLACEMENT  Patient Location: Endoscopy Unit  Anesthesia Type:General  Level of Consciousness: awake, alert  and oriented  Airway & Oxygen Therapy: Patient Spontanous Breathing and Patient connected to nasal cannula oxygen  Post-op Assessment: Report given to RN and Post -op Vital signs reviewed and stable  Post vital signs: Reviewed and stable  Last Vitals:  Vitals Value Taken Time  BP 175/83 03/06/20 1327  Temp 36.4 C 03/06/20 1327  Pulse 112 03/06/20 1334  Resp 21 03/06/20 1334  SpO2 99 % 03/06/20 1334  Vitals shown include unvalidated device data.  Last Pain:  Vitals:   03/06/20 1327  TempSrc: Oral  PainSc:       Patients Stated Pain Goal:  (patient unable to decide) (65/78/46 9629)  Complications: No complications documented.

## 2020-03-07 ENCOUNTER — Encounter (HOSPITAL_COMMUNITY): Payer: Self-pay | Admitting: Emergency Medicine

## 2020-03-07 LAB — CYTOLOGY - NON PAP

## 2020-03-08 ENCOUNTER — Other Ambulatory Visit: Payer: Self-pay | Admitting: Internal Medicine

## 2020-03-08 DIAGNOSIS — Z0000 Encounter for general adult medical examination without abnormal findings: Secondary | ICD-10-CM

## 2020-03-09 ENCOUNTER — Telehealth: Payer: Self-pay | Admitting: Emergency Medicine

## 2020-03-09 DIAGNOSIS — R911 Solitary pulmonary nodule: Secondary | ICD-10-CM

## 2020-03-09 NOTE — Telephone Encounter (Signed)
Discussed results with the patient. NSCLCA. She would be a good candidate for XRT. Plan to refer to [facility] to discuss therapy. She agrees with this plan.

## 2020-03-13 ENCOUNTER — Ambulatory Visit: Payer: Medicare Other | Admitting: Emergency Medicine

## 2020-03-14 ENCOUNTER — Telehealth: Payer: Self-pay | Admitting: Emergency Medicine

## 2020-03-14 ENCOUNTER — Encounter: Payer: Self-pay | Admitting: Emergency Medicine

## 2020-03-14 ENCOUNTER — Ambulatory Visit (INDEPENDENT_AMBULATORY_CARE_PROVIDER_SITE_OTHER): Payer: Medicare Other | Admitting: Emergency Medicine

## 2020-03-14 ENCOUNTER — Encounter: Payer: Self-pay | Admitting: *Deleted

## 2020-03-14 ENCOUNTER — Other Ambulatory Visit: Payer: Self-pay

## 2020-03-14 VITALS — BP 116/72 | HR 65 | Temp 97.8°F | Ht 65.0 in | Wt 160.0 lb

## 2020-03-14 DIAGNOSIS — I2510 Atherosclerotic heart disease of native coronary artery without angina pectoris: Secondary | ICD-10-CM

## 2020-03-14 DIAGNOSIS — C3490 Malignant neoplasm of unspecified part of unspecified bronchus or lung: Secondary | ICD-10-CM

## 2020-03-14 DIAGNOSIS — C3411 Malignant neoplasm of upper lobe, right bronchus or lung: Secondary | ICD-10-CM

## 2020-03-14 DIAGNOSIS — C3491 Malignant neoplasm of unspecified part of right bronchus or lung: Secondary | ICD-10-CM | POA: Diagnosis not present

## 2020-03-14 NOTE — Assessment & Plan Note (Signed)
Right upper lobe nodule, suspect stage I disease.  I will arrange for a PET scan.  I referred her on 11/12 to [facility].  We will check on status of the referral today.  Please continue same medications as you have been taking them. We will confirm today that you have follow-up arranged with thoracic oncology.  I believe that you will be a good candidate for targeted radiation therapy to your adenocarcinoma. Follow with Dr Lamonte Sakai in 6 months or sooner if you have any problems

## 2020-03-14 NOTE — Progress Notes (Signed)
Subjective:    Patient ID: [name], female    DOB: 08-25-1932, 86 y.o.   MRN: [id number]  HPI  86 year old former smoker (10 pack years) with a history of breast cancer, AVR for severe aortic stenosis, CAD, A fib (amio, eliquis) with associated diastolic CHF,  allergies, GERD with hiatal hernia.  She has been undergoing serial CT scans of the chest to follow a rounded groundglass right upper lobe pulmonary nodule.  It looks like this was first discovered on a CT chest 09/25/2016 performed to evaluate a chest wall lipoma.  I have reviewed all of her CTs.  There has been slow increase in size in the pulmonary nodule and there is now a small solid component.  Most recent CT shows that the largest diameter is now 1.2 cm with a 4 mm more solid area.  She is referred today to discuss evaluation of the nodule.   She reports that she is doing well, denies SOB, CP, cough.   Pulmonary function testing done 11/14/2014 reviewed by me, shows mild obstruction without a bronchodilator response, normal lung volumes and a decreased diffusion capacity.  ROV 02/27/20 --this is a follow-up visit for 86 year old woman with a remote small tobacco history, breast cancer, AVR, CAD, A. fib with associated diastolic dysfunction.  She has been followed with serial CT scans for slowly enlarging rounded right upper lobe groundglass nodule.  Most recent CT 01/06/2020 showed that it was 1.2 cm with a 4 mm more solid area, consistent with a slow-growing adenocarcinoma.  We discussed the pros and cons of possible bronchoscopy for tissue diagnosis to plan therapeutics.  Currently managed on Eliquis.   ROV 03/14/20 --86 year old woman with some history of small tobacco exposure, breast cancer, AVR, CAD, A. fib with diastolic dysfunction. We performed navigational bronchoscopy 03/06/2020 to evaluate a slowly enlarging groundglass pulmonary nodule in the right upper lobe. She is now back on Eliquis. Her pathology showed in  [place], consistent with adenocarcinoma. She hasn't heard from Oncology  She is still having some intermittent cough, dry, no blood. Has been present since the FOB.    Review of Systems As per HPI     Objective:   Physical Exam Vitals:   03/14/20 1055  BP: 116/72  Pulse: 65  Temp: 97.8 F (36.6 C)  SpO2: 96%  Weight: 160 lb (72.6 kg)  Height: 5\' 5"  (1.651 m)   Gen: Pleasant, well-nourished, in no distress,  normal affect  ENT: No lesions,  mouth clear,  oropharynx clear, no postnasal drip  Neck: No JVD, no stridor  Lungs: No use of accessory muscles, no crackles or wheezing on normal respiration, no wheeze on forced expiration  Cardiovascular: RRR, heart sounds normal, no murmur or gallops, no peripheral edema  Musculoskeletal: No deformities, no cyanosis or clubbing  Neuro: alert, awake, non focal  Skin: Warm, no lesions or rash       Assessment & Plan:  Adenocarcinoma, lung, right (HCC) Right upper lobe nodule, suspect stage I disease.  I will arrange for a PET scan.  I referred her on 11/12 to [facility].  We will check on status of the referral today.  Please continue same medications as you have been taking them. We will confirm today that you have follow-up arranged with thoracic oncology.  I believe that you will be a good candidate for targeted radiation therapy to your adenocarcinoma. Follow with Dr Lamonte Sakai in 6 months or sooner if you have any problems  Baltazar Apo, MD,  PhD 03/14/2020, 1:28 PM [facility] [phone] or if no answer [phone]

## 2020-03-14 NOTE — Patient Instructions (Addendum)
Please continue same medications as you have been taking them. We will confirm today that you have follow-up arranged with thoracic oncology.  I believe that you will be a good candidate for targeted radiation therapy to your adenocarcinoma. Follow with Dr Lamonte Sakai in 6 months or sooner if you have any problems

## 2020-03-14 NOTE — Telephone Encounter (Signed)
Spoke with pt. She has an appointment with RB today at 1045. She had her COVID booster yesterday and is now running a low grad temp and has chills. Pt wants to know if it's okay to keep her appointment today.  RB - please advise. Thanks.

## 2020-03-14 NOTE — Telephone Encounter (Signed)
Per RB >> if low grade temp is her only symptom, she can keep her appointment.  Spoke with pt. She is aware of this information. Nothing further was needed.

## 2020-03-15 ENCOUNTER — Encounter: Payer: Self-pay | Admitting: Internal Medicine

## 2020-03-15 ENCOUNTER — Encounter: Payer: Self-pay | Admitting: Radiation Oncology

## 2020-03-17 MED ORDER — HYDROCODONE-HOMATROPINE 5-1.5 MG/5ML PO SYRP: 5.0000 mL | ORAL_SOLUTION | Freq: Three times a day (TID) | ORAL | 0 refills | Status: DC | PRN

## 2020-03-17 MED ORDER — HYDROCODONE-HOMATROPINE 5-1.5 MG/5ML PO SYRP: 5.0000 mL | ORAL_SOLUTION | Freq: Three times a day (TID) | ORAL | Status: DC | PRN

## 2020-03-17 NOTE — Addendum Note (Signed)
Addended by: [name] on: 03/17/2020 05:42 PM   Modules accepted: Orders

## 2020-03-18 MED ORDER — HYDROCODONE-HOMATROPINE 5-1.5 MG/5ML PO SYRP: 5.0000 mL | ORAL_SOLUTION | Freq: Three times a day (TID) | ORAL | 0 refills | Status: DC | PRN

## 2020-03-18 NOTE — Addendum Note (Signed)
Addended by: [name] on: 03/18/2020 03:22 PM   Modules accepted: Orders

## 2020-03-19 ENCOUNTER — Telehealth: Payer: Self-pay | Admitting: Internal Medicine

## 2020-03-19 NOTE — Telephone Encounter (Signed)
[name] with [facility]. They are unable to fill a Hycodan syrup rx. The medication has been on back order for 3 weeks. The rx cannot be transferred. The rx will have to be sent to a new pharmacy or a new medication will need to be called in.

## 2020-03-19 NOTE — Telephone Encounter (Signed)
rx sent yesterday to [facility], so she is all set

## 2020-03-20 ENCOUNTER — Ambulatory Visit (INDEPENDENT_AMBULATORY_CARE_PROVIDER_SITE_OTHER): Payer: Medicare Other | Admitting: Otolaryngology

## 2020-03-20 ENCOUNTER — Other Ambulatory Visit: Payer: Self-pay

## 2020-03-20 VITALS — Temp 97.5°F

## 2020-03-20 DIAGNOSIS — R040 Epistaxis: Secondary | ICD-10-CM

## 2020-03-20 DIAGNOSIS — I2510 Atherosclerotic heart disease of native coronary artery without angina pectoris: Secondary | ICD-10-CM | POA: Diagnosis not present

## 2020-03-20 NOTE — Progress Notes (Signed)
HPI: [name] is a 86 y.o. female who returns today for evaluation of right-sided epistaxis that occurred last week.  She was able to stop this using a cotton ball and Afrin.  She had a previous nosebleed over a month ago.  She is on Eliquis..  Past Medical History:  Diagnosis Date  . Allergy   . Anemia   . Anxiety   . Aortic valve stenosis, severe    s/p AVR // mild to mod paravalvular leakage // Echo 9/21: EF 55-60, no RWMA, mild LVH, GR 2 DD, mildly reduced RVSF, RVSP 38.7, moderate LAE, mild RAE, AVR with mean gradient 11 mmHg, mild paravalvular leakage  . Breast cancer ([place]) 2007   right  . Cataract   . Constipation   . Dysrhythmia    A-fib  . Hiatal hernia    pt denies  . Hypercholesteremia   . Hypertension   . Hypoglycemia   . Osteopenia   . Personal history of radiation therapy   . Tingling   . Ulcer 2012   bleeding gastric Ulcer   Past Surgical History:  Procedure Laterality Date  . AORTIC VALVE REPLACEMENT N/A 11/16/2014   Procedure: AORTIC VALVE REPLACEMENT (AVR);  Surgeon: Gaye Pollack, MD;  Location: [place];  Service: Open Heart Surgery;  Laterality: N/A;  . BIOPSY  12/15/2017   Procedure: BIOPSY;  Surgeon: Rush Landmark Telford Nab., MD;  Location: [place];  Service: Gastroenterology;;  . BREAST BIOPSY    . BREAST LUMPECTOMY Right    2005?  Marland Kitchen BREAST SURGERY    . BRONCHIAL BIOPSY  03/06/2020   Procedure: BRONCHIAL BIOPSIES;  Surgeon: Collene Gobble, MD;  Location: [facility] ENDOSCOPY;  Service: Pulmonary;;  . BRONCHIAL BRUSHINGS  03/06/2020   Procedure: BRONCHIAL BRUSHINGS;  Surgeon: Collene Gobble, MD;  Location: [facility] ENDOSCOPY;  Service: Pulmonary;;  . BRONCHIAL NEEDLE ASPIRATION BIOPSY  03/06/2020   Procedure: BRONCHIAL NEEDLE ASPIRATION BIOPSIES;  Surgeon: Collene Gobble, MD;  Location: [facility] ENDOSCOPY;  Service: Pulmonary;;  . BRONCHIAL WASHINGS  03/06/2020   Procedure: BRONCHIAL WASHINGS;  Surgeon: Collene Gobble, MD;  Location: [facility] ENDOSCOPY;  Service:  Pulmonary;;  . CARDIAC CATHETERIZATION N/A 10/18/2014   Procedure: Right/Left Heart Cath and Coronary Angiography;  Surgeon: [name], MD;  Location: [place] CV LAB;  Service: Cardiovascular;  Laterality: N/A;  . CARDIOVERSION N/A 12/09/2019   Procedure: CARDIOVERSION;  Surgeon: Josue Hector, MD;  Location: [facility] ENDOSCOPY;  Service: Cardiovascular;  Laterality: N/A;  . CARDIOVERSION N/A 12/30/2019   Procedure: CARDIOVERSION;  Surgeon: Josue Hector, MD;  Location: [facility] ENDOSCOPY;  Service: Cardiovascular;  Laterality: N/A;  . CORONARY ANGIOGRAPHY N/A 02/04/2019   Procedure: CORONARY ANGIOGRAPHY;  Surgeon: Nelva Bush, MD;  Location: [place] CV LAB;  Service: Cardiovascular;  Laterality: N/A;  . COSMETIC SURGERY     face  . ESOPHAGOGASTRODUODENOSCOPY (EGD) WITH PROPOFOL N/A 12/15/2017   Procedure: ESOPHAGOGASTRODUODENOSCOPY (EGD) WITH PROPOFOL;  Surgeon: Rush Landmark Telford Nab., MD;  Location: [place];  Service: Gastroenterology;  Laterality: N/A;  . FIDUCIAL MARKER PLACEMENT  03/06/2020   Procedure: FIDUCIAL MARKER PLACEMENT;  Surgeon: Collene Gobble, MD;  Location: [facility] ENDOSCOPY;  Service: Pulmonary;;  . PARATHYROIDECTOMY    . TEE WITHOUT CARDIOVERSION N/A 11/16/2014   Procedure: TRANSESOPHAGEAL ECHOCARDIOGRAM (TEE);  Surgeon: Gaye Pollack, MD;  Location: [place];  Service: Open Heart Surgery;  Laterality: N/A;  . TEE WITHOUT CARDIOVERSION N/A 12/09/2019   Procedure: TRANSESOPHAGEAL ECHOCARDIOGRAM (TEE);  Surgeon: Josue Hector,  MD;  Location: [facility] ENDOSCOPY;  Service: Cardiovascular;  Laterality: N/A;  . TONSILLECTOMY    . VIDEO BRONCHOSCOPY WITH ENDOBRONCHIAL NAVIGATION Right 03/06/2020   Procedure: VIDEO BRONCHOSCOPY WITH ENDOBRONCHIAL NAVIGATION;  Surgeon: Collene Gobble, MD;  Location: [facility] ENDOSCOPY;  Service: Pulmonary;  Laterality: Right;   Social History   Socioeconomic History  . Marital status: Unknown    Spouse name: Not on file  . Number of children: 3  .  Years of education: 16  . Highest education level: Bachelor's degree (e.g., BA, AB, BS)  Occupational History  . Occupation: Retired  Tobacco Use  . Smoking status: Former Smoker    Packs/day: 0.50    Years: 15.00    Pack years: 7.50    Types: Cigarettes    Start date: 20    Quit date: 1986    Years since quitting: 35.9  . Smokeless tobacco: Never Used  . Tobacco comment: quit smoking 1980  Vaping Use  . Vaping Use: Never used  Substance and Sexual Activity  . Alcohol use: Yes    Alcohol/week: 7.0 standard drinks    Types: 7 Glasses of wine per week    Comment: wine nightly with dinner  . Drug use: No  . Sexual activity: Not Currently  Other Topics Concern  . Not on file  Social History Narrative   ** Merged History Encounter **       Lives at [facility] with her husband. Right-handed. Caffeine use: 3-4 cups per day (some tea, mixes coffee with decaf).   Social Determinants of Health   Financial Resource Strain:   . Difficulty of Paying Living Expenses: Not on file  Food Insecurity:   . Worried About Charity fundraiser in the Last Year: Not on file  . Ran Out of Food in the Last Year: Not on file  Transportation Needs:   . Lack of Transportation (Medical): Not on file  . Lack of Transportation (Non-Medical): Not on file  Physical Activity:   . Days of Exercise per Week: Not on file  . Minutes of Exercise per Session: Not on file  Stress:   . Feeling of Stress : Not on file  Social Connections:   . Frequency of Communication with Friends and Family: Not on file  . Frequency of Social Gatherings with Friends and Family: Not on file  . Attends Religious Services: Not on file  . Active Member of Clubs or Organizations: Not on file  . Attends Archivist Meetings: Not on file  . Marital Status: Not on file   Family History  Problem Relation Age of Onset  . Hypertension Mother   . Rectal cancer Mother   . Colon cancer Mother   . Kidney disease  Father   . Cancer Cousin 63       breast and ovarian   . Esophageal cancer Neg Hx   . Stomach cancer Neg Hx   . Colon polyps Neg Hx    Allergies  Allergen Reactions  . Lactose Intolerance (Gi) Other (See Comments)    GI upset  . Amoxicillin-Pot Clavulanate Hives and Rash  . Penicillins Hives and Rash    30 years  . Sulfa Antibiotics Hives and Rash  . Sulfonamide Derivatives Hives and Rash   Prior to Admission medications   Medication Sig Start Date End Date Taking? Authorizing Provider  acetaminophen (TYLENOL) 500 MG tablet Take 500-1,000 mg by mouth every 6 (six) hours as needed for mild pain or  moderate pain.    Yes [provider]  ALPRAZolam Duanne Moron) 0.5 MG tablet Take 1 tablet (0.5 mg total) by mouth daily as needed for anxiety. 03/06/20  Yes Collene Gobble, MD  amiodarone (PACERONE) 200 MG tablet Take 0.5 tablets (100 mg total) by mouth daily. 01/31/20 01/25/21 Yes [name], MD  apixaban (ELIQUIS) 5 MG TABS tablet Take 1 tablet (5 mg total) by mouth 2 (two) times daily. Okay to restart this medication on 03/07/2020. 03/06/20  Yes Collene Gobble, MD  Calcium Carb-Cholecalciferol (CALCIUM 600+D3) 600-400 MG-UNIT TABS Take 1 tablet by mouth daily.   Yes [provider]  Chloral Hydrate CRYS Take 10 mLs by mouth at bedtime as needed (sleeping). 03/06/20  Yes Collene Gobble, MD  Cholecalciferol (VITAMIN D-3) 1000 UNITS CAPS Take 1,000 Units by mouth daily.    Yes [provider]  clindamycin (CLEOCIN) 300 MG capsule Take 600 mg by mouth See admin instructions. Take 600 mg 1 hour prior to dental work   Yes [provider]  ferrous sulfate 325 (65 FE) MG tablet Take 325 mg by mouth every Monday, Wednesday, and Friday.   Yes [provider]  fluticasone (FLONASE) 50 MCG/ACT nasal spray Place 2 sprays into both nostrils daily as needed for allergies. 03/06/20  Yes Collene Gobble, MD  furosemide (LASIX) 20 MG tablet Take 0.5 tablets (10 mg  total) by mouth daily. 03/06/20 03/01/21 Yes Byrum, Rose Fillers, MD  HYDROcodone-homatropine [facility]) 5-1.5 MG/5ML syrup Take 5 mLs by mouth every 8 (eight) hours as needed for cough. 03/18/20  Yes Burns, Claudina Lick, MD  levocetirizine (XYZAL) 5 MG tablet Take 5 mg by mouth at bedtime as needed for allergies.  10/11/18  Yes [provider]  montelukast (SINGULAIR) 10 MG tablet Take 1 tablet (10 mg total) by mouth daily as needed (allergies). 03/06/20  Yes Byrum, Rose Fillers, MD  nitroGLYCERIN (NITROSTAT) 0.4 MG SL tablet Place 1 tablet (0.4 mg total) under the tongue every 5 (five) minutes as needed for chest pain (CP or SOB). 02/05/19  Yes Duke, Tami Lin, PA  Ophthalmic Irrigation Solution (OCUSOFT EYE [place] OP) Apply 1 application to eye at bedtime.    Yes [provider]  pantoprazole (PROTONIX) 40 MG tablet TAKE 1 TABLET BY MOUTH TWICE DAILY. Patient taking differently: Take 40 mg by mouth 2 (two) times daily.  01/31/20  Yes Mansouraty, Telford Nab., MD  polyethylene glycol (MIRALAX / GLYCOLAX) 17 g packet Take 17 g by mouth every other day.   Yes [provider]  potassium chloride (KLOR-CON) 10 MEQ tablet Take 1 tablet (10 mEq total) by mouth daily. 12/23/19 12/17/20 Yes [name], MD  rosuvastatin (CRESTOR) 20 MG tablet Take 1 tablet (20 mg total) by mouth daily. 06/23/19  Yes [name], MD  tretinoin (RETIN-A) 0.1 % cream Apply 1 application topically every Monday, Wednesday, and Friday.  01/18/09  Yes [provider]  Wheat Dextrin (BENEFIBER DRINK MIX PO) Take 1 Dose by mouth every other day.    Yes [provider]     Positive ROS: Otherwise negative  All other systems have been reviewed and were otherwise negative with the exception of those mentioned in the HPI and as above.  Physical Exam: Constitutional: Alert, well-appearing, no acute distress Ears: External ears without lesions or tenderness. Ear canals are clear bilaterally with  intact, clear TMs.  Nasal: External nose without lesions. Septum midline.  She has a scab over a vessel  anteriorly inferiorly on the right side of the septum.  The scab was removed and she had bleeding that was controlled with silver nitrate cauterization.. Clear nasal passages otherwise. Oral: Lips and gums without lesions. Tongue and palate mucosa without lesions. Posterior oropharynx clear. Neck: No palpable adenopathy or masses Respiratory: Breathing comfortably  Skin: No facial/neck lesions or rash noted.  Control of epistaxis  Date/Time: 03/20/2020 6:32 PM Performed by: Rozetta Nunnery, MD Authorized by: Rozetta Nunnery, MD   Consent:    Consent obtained:  Verbal   Consent given by:  Patient   Risks discussed:  Bleeding and pain   Alternatives discussed:  No treatment and observation Anesthesia:    Anesthesia method:  None Procedure details:    Treatment site:  R anterior   Treatment method:  Silver nitrate   Treatment complexity:  Limited   Treatment episode: initial   Post-procedure details:    Patient tolerance of procedure:  Tolerated well, no immediate complications Comments:     Patient had a small vessel on the right anterior inferior septum that was cauterized using silver nitrate.    Assessment: Right-sided epistaxis that has been recurrent on patient on Eliquis.  Plan: This was cauterized using silver nitrate.  Reviewed with her concerning using cotton ball and Afrin nasal packing to control bleeding in the future if it recurs. She will follow-up as needed.   Radene Journey, MD

## 2020-03-28 ENCOUNTER — Ambulatory Visit (HOSPITAL_COMMUNITY): Payer: Medicare Other

## 2020-03-29 ENCOUNTER — Ambulatory Visit: Payer: Medicare Other

## 2020-03-29 NOTE — Chronic Care Management (AMB) (Unsigned)
Chronic Care Management Pharmacy  Name: [name]  MRN: [id number] DOB: 1933-04-03   Chief Complaint/ HPI  [name],  86 y.o. , female presents for her Initial CCM visit with the clinical pharmacist via telephone due to COVID-19 Pandemic.  PCP : [name], MD Patient Care Team: [name], MD as PCP - General (Internal Medicine) [name], MD as PCP - Cardiology (Cardiology) Pyrtle, Lajuan Lines, MD as Consulting Physician (Gastroenterology) [name], MD as Consulting Physician (Cardiology) Nicholas Lose, MD as Consulting Physician (Hematology and Oncology) Feliz Beam, MD as Referring Physician (Ophthalmology) Marcial Pacas, MD as Consulting Physician (Neurology) Mansouraty, Telford Nab., MD as Consulting Physician (Gastroenterology) Charlton Haws, [facility] as Pharmacist (Pharmacist)  Patient's chronic conditions include: Hypertension, Hyperlipidemia, Atrial Fibrillation, Coronary Artery Disease, Anxiety, Osteopenia and Allergic Rhinitis, hx breast cancer (2005), prediabetes, PUD, chronic constipation, anemia  Office Visits: 03/15/20 Dr Quay Burow pt message: reduce iron to BIW due to constipation  12/23/19 Dr Quay Burow OV: chronic f/u. New onset Afib this month at GI, follows with cardiology. Ordered chest CT to f/u lung nodule.  Consult Visit: 03/20/20 Dr Lucia Gaskins (ENT): R sided epistaxis, recurrent on Eliquis. Cauterized with silver nitrate. Use cotton ball and Afrin to control in the future.  03/14/20 Dr Lamonte Sakai (pulmonary): f/u for adenocarcinoma of lung, s/p bronchoscopy 11/9, showing NSCLC. Ordered PET scan. F/u with oncology. Good candidate for radiation.  02/22/20 Dr Lucia Gaskins (ENT): cerumen impaction removal  02/19/20 Dr Burt Knack pt message: stop aspirin and metoprolol (avg HR 49 on monitor).  01/23/20 Dr Burt Knack (cardiology): maintaining NSR after cardioversion on amiodarone (9/3). Suspect diastolic dysfunction and afib contributing to volume  overload and HF.   12/23/19 Dr Burt Knack (cardiology): f/u after cardioversion, back in Afib. Acute on chronic diastolic HF, increase Lasix to 40 mg daily, hold losartan, add KCl 10 meq.  12/06/19 Dr Marlou Porch (cardiology): new onset Afib - started metoprolol, Eliquis and scheduled TEE cardioversion for 8/13. Continue aspirin for now, may d/c if signs of bleeding and continue Eliquis monotherapy.  12/06/19 Dr Rush Landmark (GI): gastric ulcer f/u, doing well from GI perspective but c/o DOE and palpitations, consistent with new onset Afib. Set up cardiology visit that day.  Allergies  Allergen Reactions  . Lactose Intolerance (Gi) Other (See Comments)    GI upset  . Amoxicillin-Pot Clavulanate Hives and Rash  . Penicillins Hives and Rash    30 years  . Sulfa Antibiotics Hives and Rash  . Sulfonamide Derivatives Hives and Rash   Medications: Outpatient Encounter Medications as of 03/29/2020  Medication Sig Note  . acetaminophen (TYLENOL) 500 MG tablet Take 500-1,000 mg by mouth every 6 (six) hours as needed for mild pain or moderate pain.    Marland Kitchen ALPRAZolam (XANAX) 0.5 MG tablet Take 1 tablet (0.5 mg total) by mouth daily as needed for anxiety.   Marland Kitchen amiodarone (PACERONE) 200 MG tablet Take 0.5 tablets (100 mg total) by mouth daily.   Marland Kitchen apixaban (ELIQUIS) 5 MG TABS tablet Take 1 tablet (5 mg total) by mouth 2 (two) times daily. Okay to restart this medication on 03/07/2020.   . Calcium Carb-Cholecalciferol (CALCIUM 600+D3) 600-400 MG-UNIT TABS Take 1 tablet by mouth daily.   . Chloral Hydrate CRYS Take 10 mLs by mouth at bedtime as needed (sleeping).   . Cholecalciferol (VITAMIN D-3) 1000 UNITS CAPS Take 1,000 Units by mouth daily.    . clindamycin (CLEOCIN) 300 MG capsule Take 600 mg by mouth See admin instructions. Take  600 mg 1 hour prior to dental work   . ferrous sulfate 325 (65 FE) MG tablet Take 325 mg by mouth every Monday, Wednesday, and Friday.   . fluticasone (FLONASE) 50 MCG/ACT nasal spray  Place 2 sprays into both nostrils daily as needed for allergies.   . furosemide (LASIX) 20 MG tablet Take 0.5 tablets (10 mg total) by mouth daily.   Marland Kitchen HYDROcodone-homatropine (HYCODAN) 5-1.5 MG/5ML syrup Take 5 mLs by mouth every 8 (eight) hours as needed for cough.   . levocetirizine (XYZAL) 5 MG tablet Take 5 mg by mouth at bedtime as needed for allergies.    . montelukast (SINGULAIR) 10 MG tablet Take 1 tablet (10 mg total) by mouth daily as needed (allergies).   . nitroGLYCERIN (NITROSTAT) 0.4 MG SL tablet Place 1 tablet (0.4 mg total) under the tongue every 5 (five) minutes as needed for chest pain (CP or SOB).   Marland Kitchen Ophthalmic Irrigation Solution (OCUSOFT EYE [place] OP) Apply 1 application to eye at bedtime.    . pantoprazole (PROTONIX) 40 MG tablet TAKE 1 TABLET BY MOUTH TWICE DAILY. (Patient taking differently: Take 40 mg by mouth 2 (two) times daily. )   . polyethylene glycol (MIRALAX / GLYCOLAX) 17 g packet Take 17 g by mouth every other day. 03/02/2020: Alternates taking miralax one day and benefiber the next  . potassium chloride (KLOR-CON) 10 MEQ tablet Take 1 tablet (10 mEq total) by mouth daily.   . rosuvastatin (CRESTOR) 20 MG tablet Take 1 tablet (20 mg total) by mouth daily.   Marland Kitchen tretinoin (RETIN-A) 0.1 % cream Apply 1 application topically every Monday, Wednesday, and Friday.    . Wheat Dextrin (BENEFIBER DRINK MIX PO) Take 1 Dose by mouth every other day.  03/02/2020: Alternates taking miralax one day and benefiber the next   No facility-administered encounter medications on file as of 03/29/2020.    Wt Readings from Last 3 Encounters:  03/14/20 160 lb (72.6 kg)  02/27/20 158 lb 12.8 oz (72 kg)  02/07/20 160 lb 9.6 oz (72.8 kg)    Current Diagnosis/Assessment:    Goals Addressed   None     AFIB / Hypertension   Afib Onset 12/06/2019 Cardioversion 12/09/19, 12/30/19, amiodarone load  Patient is currently rhythm controlled. Office heart rates are  Pulse Readings from  Last 3 Encounters:  03/14/20 65  03/06/20 (!) 114  02/27/20 (!) 54   CHA2DS2-VASc Score = 5  The patient's score is based upon: CHF History: 0 HTN History: 1 Diabetes History: 0 Stroke History: 0 Vascular Disease History: 1 Age Score: 2 Gender Score: 1  BP goal is:  <130/80  Office blood pressures are  BP Readings from Last 3 Encounters:  03/14/20 116/72  03/06/20 (!) 145/90  02/27/20 (!) 150/68   Patient checks BP at home {CHL HP BP Monitoring Frequency:7093598368} Patient home BP readings are ranging: ***  Patient has failed these meds in past: *** Patient is currently {CHL Controlled/Uncontrolled:3106264540} on the following medications:  . Amiodarone 200 mg - 1/2 tab daily . Furosemide 20 mg - 1/2 tab daily . Eliquis 5 mg BID  We discussed:  {CHL HP Upstream Pharmacy discussion:[phone]}  Plan  Continue {CHL HP Upstream Pharmacy Plans:6397308146}   Hyperlipidemia / CAD   LDL goal < 70 Hx NSTEMI 01/2019. DAPT x 1 year  Last lipids Lab Results  Component Value Date   CHOL 230 (H) 06/20/2019   HDL 94.90 06/20/2019   LDLCALC 120 (H) 06/20/2019  LDLDIRECT 144.0 11/29/2012   TRIG 73.0 06/20/2019   CHOLHDL 2 06/20/2019   Hepatic Function Latest Ref Rng & Units 03/06/2020 06/20/2019 02/05/2019  Total Protein 6.5 - 8.1 g/dL 6.2(L) 6.7 5.7(L)  Albumin 3.5 - 5.0 g/dL 3.9 4.6 3.6  AST 15 - 41 U/L 39 23 64(H)  ALT 0 - 44 U/L 36 17 29  Alk Phosphatase 38 - 126 U/L 95 84 83  Total Bilirubin 0.3 - 1.2 mg/dL 1.0 0.8 0.8  Bilirubin, Direct 0.0 - 0.2 mg/dL - - 0.2    The ASCVD Risk score (Portal., et al., 2013) failed to calculate for the following reasons:   The 2013 ASCVD risk score is only valid for ages 44 to 22   The patient has a prior MI or stroke diagnosis   Patient has failed these meds in past: *** Patient is currently {CHL Controlled/Uncontrolled:[phone]} on the following medications:  . Rosuvastatin 20 mg daily . Nitroglycerin 0.4 mg SL  prn  We discussed:  {CHL HP Upstream Pharmacy discussion:[phone]}  Plan  Continue {CHL HP Upstream Pharmacy Plans:[phone]}  Anxiety / Insomnia   Patient has failed these meds in past: *** Patient is currently {CHL Controlled/Uncontrolled:[phone]} on the following medications:  . Alprazolam 0.5 mg PRN . Chloral hydrate 10 mL HS prn  We discussed:  ***  Plan  Continue {CHL HP Upstream Pharmacy Plans:[phone]}  GERD / PUD   Patient has failed these meds in past: *** Patient is currently {CHL Controlled/Uncontrolled:[phone]} on the following medications:  . Pantoprazole 40 mg BID  We discussed:  ***  Plan  Continue {CHL HP Upstream Pharmacy Plans:[phone]}  Chronic constipation   Patient has failed these meds in past: *** Patient is currently {CHL Controlled/Uncontrolled:[phone]} on the following medications:  Marland Kitchen Miralax QOD . Benefiber QOD  We discussed:  ***  Plan  Continue {CHL HP Upstream Pharmacy Plans:[phone]}  Anemia   CBC Latest Ref Rng & Units 03/06/2020 12/30/2019 12/06/2019  WBC 4.0 - 10.5 K/uL 6.6 - 10.9(H)  Hemoglobin 12.0 - 15.0 g/dL 15.0 15.3(H) 13.6  Hematocrit 36 - 46 % 46.7(H) 45.0 41.8  Platelets 150 - 400 K/uL 145(L) - 176   Iron/TIBC/Ferritin/ %Sat    Component Value Date/Time   IRON 51 12/20/2018 1024   TIBC 371 12/20/2018 1024   FERRITIN 13 (L) 12/20/2018 1024   IRONPCTSAT 14 (L) 12/20/2018 1024   Patient has failed these meds in past: *** Patient is currently {CHL Controlled/Uncontrolled:[phone]} on the following medications:  . Ferrous sulfate 325 mg twice a week  We discussed:  ***  Plan  Continue {CHL HP Upstream Pharmacy Plans:[phone]}  Osteopenia   Last DEXA Scan: 12/20/2018  T-Score femoral neck: -2.2  T-Score lumbar spine: -1.4  10-year probability of major osteoporotic fracture: 17.1%  10-year probability of hip fracture: 5.7%  Last vitamin D/Calcium Lab Results  Component Value Date    VD25OH 49.56 02/26/2015   CALCIUM 9.0 03/06/2020   Patient is a candidate for pharmacologic treatment due to T-Score -1.0 to -2.5 and 10-year risk of hip fracture > 3%  Patient has failed these meds in past: *** Patient is currently {CHL Controlled/Uncontrolled:[phone]} on the following medications:  . Calcium carbonate - Vitamin D 600-400 mg-IU daily . Vitamin D 1000 IU daily  We discussed:  {Osteoporosis Counseling:23892}  Plan  Continue {CHL HP Upstream Pharmacy Plans:[phone]}  Allergic rhinitis / intermittent cough   Patient has failed these meds in past: *** Patient is currently {CHL Controlled/Uncontrolled:[phone]} on the  following medications:  . Fluticasone nasal spray PRN . Levocetirizine 5 mg HS prn . Montelukast 10 mg HS prn . Hycodan cough syrup PRN  We discussed:  ***  Plan  Continue {CHL HP Upstream Pharmacy Plans:[phone]}  Health Maintenance   Patient is currently {CHL Controlled/Uncontrolled:[phone]} on the following medications:  . Klor-Con 10 mEq daily . Tretinoin 0.1% cream MWF . Tylenol 500 mg PRN  We discussed:  ***  Plan  Continue {CHL HP Upstream Pharmacy PXTGG:[id number]}  Medication Management   Patient's preferred pharmacy is:  [place], [place] [address] Phone: [phone] Fax: [phone]  Uses pill box? {Yes or If no, why not?:20788} Pt endorses ***% compliance  We discussed: {Pharmacy options:24294}  Plan  {US Pharmacy OFBP:10258}    Follow up: *** month phone visit  ***

## 2020-03-30 ENCOUNTER — Other Ambulatory Visit: Payer: Self-pay

## 2020-03-30 ENCOUNTER — Encounter (HOSPITAL_COMMUNITY)
Admission: RE | Admit: 2020-03-30 | Discharge: 2020-03-30 | Disposition: A | Discharge status: Home / Self Care | Payer: Medicare Other | Source: Ambulatory Visit | Attending: Emergency Medicine | Admitting: Emergency Medicine

## 2020-03-30 ENCOUNTER — Telehealth: Payer: Self-pay | Admitting: Emergency Medicine

## 2020-03-30 DIAGNOSIS — C3490 Malignant neoplasm of unspecified part of unspecified bronchus or lung: Secondary | ICD-10-CM

## 2020-03-30 DIAGNOSIS — I10 Essential (primary) hypertension: Secondary | ICD-10-CM

## 2020-03-30 DIAGNOSIS — C3411 Malignant neoplasm of upper lobe, right bronchus or lung: Secondary | ICD-10-CM | POA: Diagnosis not present

## 2020-03-30 DIAGNOSIS — Z951 Presence of aortocoronary bypass graft: Secondary | ICD-10-CM | POA: Diagnosis not present

## 2020-03-30 DIAGNOSIS — R911 Solitary pulmonary nodule: Secondary | ICD-10-CM | POA: Diagnosis not present

## 2020-03-30 LAB — GLUCOSE, CAPILLARY: Glucose-Capillary: 90 mg/dL (ref 70–99)

## 2020-03-30 MED ORDER — FLUDEOXYGLUCOSE F - 18 (FDG) INJECTION
7.6700 | Freq: Once | INTRAVENOUS | Status: AC
  Administered 2020-03-30: 7.67 via INTRAVENOUS

## 2020-03-30 NOTE — Telephone Encounter (Signed)
Called and spoke with pt and she stated that she had the PET scan done this morning and doesn't want to wait all weekend for the results. I advised the pt that the PET results were not released in her chart by the radiologist as of right now.  Pt understood but wanted a message sent to [place] in case he did get the results over the weekend and he wanted to call her with these.  She really did not want to wait until Monday for these results but like I explained to her, we don;t have these results yet.  Pt would like to be called on her cell number.  Will forward to RB to make him aware.

## 2020-04-02 ENCOUNTER — Telehealth: Payer: Self-pay | Admitting: Internal Medicine

## 2020-04-02 NOTE — Telephone Encounter (Signed)
I called her to discuss the PET findings. No answer but left some details as a message. She has appt with Dr Lisbeth Renshaw in [place] on 12/7. She should be a good candidate for SBRT - eval is consistent with a stage I lesion, probably adenoCA.

## 2020-04-02 NOTE — Progress Notes (Signed)
Thoracic Location of Tumor / Histology: RUL Lung  Patient presented for surveillance CT scan for RUL nodule initially seen on CT chest 09/25/2016.  PET 40/12/7351: Low metabolic activity associated with the RIGHT upper lobe pulmonary nodule. The findings are nonspecific but not inconsistent with low-grade adenocarcinoma. No evidence of metastatic adenopathy or distant metastatic disease  Bronchoscopy 03/06/2020:  CT Chest 01/06/2020: There is a redemonstrated ground-glass pulmonary nodule of the right upper lobe measuring approximately 1.2 cm unchanged in overall size, there is however a new solid component at the medial aspect measuring approximately 4 mm. This is concerning for developing adenocarcinoma.  Biopsies of RUL 03/06/2020   Tobacco/Marijuana/Snuff/ETOH use: Former Smoker  Past/Anticipated interventions by cardiothoracic surgery, if any:   Past/Anticipated interventions by Pulmonary, if any: Dr. Lamonte Sakai 02/27/2020 -Slowly enlarging groundglass pulmonary nodule now with a more solid component.  Suspect that this is a well-differentiated lung adenocarcinoma, very unlikely to be related to her history of breast cancer.   -We talked about the pros and cons of possible bronchoscopy and tissue diagnosis to help guide subsequent therapy, probably SBRT.  She does want to proceed if possible.  03/09/2020 -Results of bronchoscopy- NSCLCA.  She would be a good candidate for XRT. -Plan to refer to [place] to discuss therapy.   Past/Anticipated interventions by medical oncology, if any:     Signs/Symptoms  Weight changes, if any: None  Respiratory complaints, if any: none  Hemoptysis, if any: No  Pain issues, if any:  No  SAFETY ISSUES:  Prior radiation? Right Breast 15 years ago, in [place]  Pacemaker/ICD? No  Possible current pregnancy? Postmenopausal  Is the patient on methotrexate?  No  Current Complaints / other details:   -History of Breast Cancer

## 2020-04-02 NOTE — Progress Notes (Signed)
  Chronic Care Management   Note  04/02/2020 Name: [name] MRN: [id number] DOB: [birth year]  [name] is a 86 y.o. year old female who is a primary care patient of Burns, Claudina Lick, MD. I reached out to [place] by phone today in response to a referral sent by [name]'s PCP, Quay Burow, Claudina Lick, MD.   [name] was given information about Chronic Care Management services today including:  1. CCM service includes personalized support from designated clinical staff supervised by her physician, including individualized plan of care and coordination with other care providers 2. 24/7 contact phone numbers for assistance for urgent and routine care needs. 3. Service will only be billed when office clinical staff spend 20 minutes or more in a month to coordinate care. 4. Only one practitioner may furnish and bill the service in a calendar month. 5. The patient may stop CCM services at any time (effective at the end of the month) by phone call to the office staff.   Patient agreed to services and verbal consent obtained.   Follow up plan:   Carley Perdue [employer] Scheduler

## 2020-04-03 ENCOUNTER — Encounter: Payer: Self-pay | Admitting: Radiation Oncology

## 2020-04-03 ENCOUNTER — Other Ambulatory Visit: Payer: Self-pay

## 2020-04-03 ENCOUNTER — Ambulatory Visit
Admission: RE | Admit: 2020-04-03 | Discharge: 2020-04-03 | Disposition: A | Discharge status: Home / Self Care | Payer: Medicare Other | Source: Ambulatory Visit | Attending: Radiation Oncology | Admitting: Radiation Oncology

## 2020-04-03 VITALS — BP 151/52 | HR 59 | Temp 98.2°F | Resp 20 | Ht 65.0 in | Wt 158.4 lb

## 2020-04-03 DIAGNOSIS — C3411 Malignant neoplasm of upper lobe, right bronchus or lung: Secondary | ICD-10-CM

## 2020-04-03 DIAGNOSIS — I10 Essential (primary) hypertension: Secondary | ICD-10-CM | POA: Diagnosis not present

## 2020-04-03 DIAGNOSIS — M8580 Other specified disorders of bone density and structure, unspecified site: Secondary | ICD-10-CM | POA: Diagnosis not present

## 2020-04-03 DIAGNOSIS — Z923 Personal history of irradiation: Secondary | ICD-10-CM | POA: Insufficient documentation

## 2020-04-03 DIAGNOSIS — Z79899 Other long term (current) drug therapy: Secondary | ICD-10-CM | POA: Insufficient documentation

## 2020-04-03 DIAGNOSIS — I350 Nonrheumatic aortic (valve) stenosis: Secondary | ICD-10-CM | POA: Insufficient documentation

## 2020-04-03 DIAGNOSIS — F419 Anxiety disorder, unspecified: Secondary | ICD-10-CM | POA: Insufficient documentation

## 2020-04-03 DIAGNOSIS — I509 Heart failure, unspecified: Secondary | ICD-10-CM | POA: Insufficient documentation

## 2020-04-03 DIAGNOSIS — Z803 Family history of malignant neoplasm of breast: Secondary | ICD-10-CM | POA: Diagnosis not present

## 2020-04-03 DIAGNOSIS — Z87891 Personal history of nicotine dependence: Secondary | ICD-10-CM | POA: Insufficient documentation

## 2020-04-03 DIAGNOSIS — Z7901 Long term (current) use of anticoagulants: Secondary | ICD-10-CM | POA: Insufficient documentation

## 2020-04-03 DIAGNOSIS — E7800 Pure hypercholesterolemia, unspecified: Secondary | ICD-10-CM | POA: Diagnosis not present

## 2020-04-03 DIAGNOSIS — I4891 Unspecified atrial fibrillation: Secondary | ICD-10-CM | POA: Insufficient documentation

## 2020-04-03 DIAGNOSIS — K5900 Constipation, unspecified: Secondary | ICD-10-CM | POA: Diagnosis not present

## 2020-04-03 NOTE — Progress Notes (Signed)
Radiation Oncology         [phone] ________________________________  Name: [name]        MRN: [id number]  Date of Service: 04/03/2020 DOB: Jan 21, 1933  GH:WEXHB, Claudina Lick, MD  Collene Gobble, MD     REFERRING PHYSICIAN: Collene Gobble, MD   DIAGNOSIS: The encounter diagnosis was Malignant neoplasm of upper lobe of right lung ([place]).   HISTORY OF PRESENT ILLNESS: [name] is a 86 y.o. female seen at the request of Dr. 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  The patient has a history of right breast cancer treated in [place], [place] with lumpectomy and adjuvant radiotherapy about 15 years ago. She also has a history of valvular disease and underwent an aortic valve replacement in July 2016 by Dr. Nell Range, as well as atrial fibrillation and diastolic CHF. The patient has been followed by pulmonary medicine for a right upper lobe pulmonary nodule that was originally identified in May 2018, notes indicate that there had been a chest wall lipoma and this CT scan was performed due to this work-up.  There has been a slow increase in the size of the right upper lobe nodule, and her CT scan without contrast of the chest on 01/06/2020 measured this area at 1.2 cm and though it was unchanged in overall size there was new solid component in the medial aspect measuring up to 4 mm, no evidence of adenopathy was identified, and she was counseled on the rationale for bronchoscopy which was performed on 03/06/2020. Biopsies and cytology of the right upper lobe from fine-needle aspiration and brushing were consistent with malignant cells morphologically consistent with an adenocarcinoma.  Atypical cells were also seen in the right upper lobe lavage.  She did undergo pet imaging on 03/30/2020, the right upper lobe nodule seen previously by CT showed an SUV max of 1.9, associated fiducial markers were seen, and no additional hypermetabolic changes were  identified in the lung or within the lymph node regions.  No other hypermetabolic activity was noted throughout the body.  She has been counseled on the rationale to come and meet with [place] to discuss options of stereotactic body radiotherapy as it is felt that she would be high risk for lobectomy with her other medical conditions.     PREVIOUS RADIATION THERAPY: Yes   About 2006: The patient received adjuvant right breast radiotherapy. Details are not known at this point about her dosing.   PAST MEDICAL HISTORY:  Past Medical History:  Diagnosis Date  . Allergy   . Anemia   . Anxiety   . Aortic valve stenosis, severe    s/p AVR // mild to mod paravalvular leakage // Echo 9/21: EF 55-60, no RWMA, mild LVH, GR 2 DD, mildly reduced RVSF, RVSP 38.7, moderate LAE, mild RAE, AVR with mean gradient 11 mmHg, mild paravalvular leakage  . Breast cancer ([place]) 2007   right  . Cataract   . Constipation   . Dysrhythmia    A-fib  . Hiatal hernia    pt denies  . Hypercholesteremia   . Hypertension   . Hypoglycemia   . Osteopenia   . Personal history of radiation therapy   . Tingling   . Ulcer 2012   bleeding gastric Ulcer       PAST SURGICAL HISTORY: Past Surgical History:  Procedure Laterality Date  . AORTIC VALVE REPLACEMENT N/A 11/16/2014   Procedure: AORTIC VALVE REPLACEMENT (AVR);  Surgeon:  Gaye Pollack, MD;  Location: [place];  Service: Open Heart Surgery;  Laterality: N/A;  . BIOPSY  12/15/2017   Procedure: BIOPSY;  Surgeon: Rush Landmark Telford Nab., MD;  Location: [place];  Service: Gastroenterology;;  . BREAST BIOPSY    . BREAST LUMPECTOMY Right    2005?  Marland Kitchen BREAST SURGERY    . BRONCHIAL BIOPSY  03/06/2020   Procedure: BRONCHIAL BIOPSIES;  Surgeon: Collene Gobble, MD;  Location[facility] ENDOSCOPY;  Service: Pulmonary;;  . BRONCHIAL BRUSHINGS  03/06/2020   Procedure: BRONCHIAL BRUSHINGS;  Surgeon: Collene Gobble, MD;  [facility];  Service: Pulmonary;;  . BRONCHIAL  NEEDLE ASPIRATION BIOPSY  03/06/2020   Procedure: BRONCHIAL NEEDLE ASPIRATION BIOPSIES;  Surgeon: Collene Gobble, MD;  Location: [facility] ENDOSCOPY;  Service: Pulmonary;;  . BRONCHIAL WASHINGS  03/06/2020   Procedure: BRONCHIAL WASHINGS;  Surgeon: Collene Gobble, MD;  Location[facility] ENDOSCOPY;  Service: Pulmonary;;  . CARDIAC CATHETERIZATION N/A 10/18/2014   Procedure: Right/Left Heart Cath and Coronary Angiography;  Surgeon: [name], MD;  Location: [place] CV LAB;  Service: Cardiovascular;  Laterality: N/A;  . CARDIOVERSION N/A 12/09/2019   Procedure: CARDIOVERSION;  Surgeon: Josue Hector, MD;  Location: [facility] ENDOSCOPY;  Service: Cardiovascular;  Laterality: N/A;  . CARDIOVERSION N/A 12/30/2019   Procedure: CARDIOVERSION;  Surgeon: Josue Hector, MD;  Location: [facility] ENDOSCOPY;  Service: Cardiovascular;  Laterality: N/A;  . CORONARY ANGIOGRAPHY N/A 02/04/2019   Procedure: CORONARY ANGIOGRAPHY;  Surgeon: Nelva Bush, MD;  Location: [place] CV LAB;  Service: Cardiovascular;  Laterality: N/A;  . COSMETIC SURGERY     face  . ESOPHAGOGASTRODUODENOSCOPY (EGD) WITH PROPOFOL N/A 12/15/2017   Procedure: ESOPHAGOGASTRODUODENOSCOPY (EGD) WITH PROPOFOL;  Surgeon: Rush Landmark Telford Nab., MD;  Location: [place];  Service: Gastroenterology;  Laterality: N/A;  . FIDUCIAL MARKER PLACEMENT  03/06/2020   Procedure: FIDUCIAL MARKER PLACEMENT;  Surgeon: Collene Gobble, MD;  Location[facility] ENDOSCOPY;  Service: Pulmonary;;  . PARATHYROIDECTOMY    . TEE WITHOUT CARDIOVERSION N/A 11/16/2014   Procedure: TRANSESOPHAGEAL ECHOCARDIOGRAM (TEE);  Surgeon: Gaye Pollack, MD;  Location: [place];  Service: Open Heart Surgery;  Laterality: N/A;  . TEE WITHOUT CARDIOVERSION N/A 12/09/2019   Procedure: TRANSESOPHAGEAL ECHOCARDIOGRAM (TEE);  Surgeon: Josue Hector, MD;  Location: [facility] ENDOSCOPY;  Service: Cardiovascular;  Laterality: N/A;  . TONSILLECTOMY    . VIDEO BRONCHOSCOPY WITH ENDOBRONCHIAL NAVIGATION Right  03/06/2020   Procedure: VIDEO BRONCHOSCOPY WITH ENDOBRONCHIAL NAVIGATION;  Surgeon: Collene Gobble, MD;  Location: [facility] ENDOSCOPY;  Service: Pulmonary;  Laterality: Right;     FAMILY HISTORY:  Family History  Problem Relation Age of Onset  . Hypertension Mother   . Rectal cancer Mother   . Colon cancer Mother   . Kidney disease Father   . Cancer Cousin 63       breast and ovarian   . Esophageal cancer Neg Hx   . Stomach cancer Neg Hx   . Colon polyps Neg Hx      SOCIAL HISTORY:  reports that she quit smoking about 35 years ago. Her smoking use included cigarettes. She started smoking about 50 years ago. She has a 7.50 pack-year smoking history. She has never used smokeless tobacco. She reports current alcohol use of about 7.0 standard drinks of alcohol per week. She reports that she does not use drugs.  The patient is married and lives in [place] in independent living at [facility]. Their daughter [name] joins [place] by speakerphone.  ALLERGIES: Lactose intolerance (gi), Amoxicillin-pot clavulanate, Penicillins, Sulfa antibiotics, and Sulfonamide derivatives   MEDICATIONS:  Current Outpatient Medications  Medication Sig Dispense Refill  . acetaminophen (TYLENOL) 500 MG tablet Take 500-1,000 mg by mouth every 6 (six) hours as needed for mild pain or moderate pain.     Marland Kitchen ALPRAZolam (XANAX) 0.5 MG tablet Take 1 tablet (0.5 mg total) by mouth daily as needed for anxiety.    Marland Kitchen amiodarone (PACERONE) 200 MG tablet Take 0.5 tablets (100 mg total) by mouth daily. 45 tablet 3  . apixaban (ELIQUIS) 5 MG TABS tablet Take 1 tablet (5 mg total) by mouth 2 (two) times daily. Okay to restart this medication on 03/07/2020. 60 tablet 6  . Calcium Carb-Cholecalciferol (CALCIUM 600+D3) 600-400 MG-UNIT TABS Take 1 tablet by mouth daily.    . Chloral Hydrate CRYS Take 10 mLs by mouth at bedtime as needed (sleeping).    . Cholecalciferol (VITAMIN D-3) 1000 UNITS CAPS Take 1,000 Units by mouth daily.     .  clindamycin (CLEOCIN) 300 MG capsule Take 600 mg by mouth See admin instructions. Take 600 mg 1 hour prior to dental work    . ferrous sulfate 325 (65 FE) MG tablet Take 325 mg by mouth every Monday, Wednesday, and Friday.    . fluticasone (FLONASE) 50 MCG/ACT nasal spray Place 2 sprays into both nostrils daily as needed for allergies.    . furosemide (LASIX) 20 MG tablet Take 0.5 tablets (10 mg total) by mouth daily.    Marland Kitchen HYDROcodone-homatropine (HYCODAN) 5-1.5 MG/5ML syrup Take 5 mLs by mouth every 8 (eight) hours as needed for cough. 120 mL 0  . levocetirizine (XYZAL) 5 MG tablet Take 5 mg by mouth at bedtime as needed for allergies.     . montelukast (SINGULAIR) 10 MG tablet Take 1 tablet (10 mg total) by mouth daily as needed (allergies).    . nitroGLYCERIN (NITROSTAT) 0.4 MG SL tablet Place 1 tablet (0.4 mg total) under the tongue every 5 (five) minutes as needed for chest pain (CP or SOB). 25 tablet 3  . Ophthalmic Irrigation Solution (OCUSOFT EYE [place] OP) Apply 1 application to eye at bedtime.     . pantoprazole (PROTONIX) 40 MG tablet TAKE 1 TABLET BY MOUTH TWICE DAILY. (Patient taking differently: Take 40 mg by mouth 2 (two) times daily. ) 180 tablet 0  . polyethylene glycol (MIRALAX / GLYCOLAX) 17 g packet Take 17 g by mouth every other day.    . potassium chloride (KLOR-CON) 10 MEQ tablet Take 1 tablet (10 mEq total) by mouth daily. 90 tablet 3  . rosuvastatin (CRESTOR) 20 MG tablet Take 1 tablet (20 mg total) by mouth daily. 90 tablet 3  . tretinoin (RETIN-A) 0.1 % cream Apply 1 application topically every Monday, Wednesday, and Friday.     . Wheat Dextrin (BENEFIBER DRINK MIX PO) Take 1 Dose by mouth every other day.      No current facility-administered medications for this encounter.     REVIEW OF SYSTEMS: On review of systems, the patient reports that she is doing well overall. She is not experiencing  denies any chest pain, shortness of breath, cough, fevers, chills, night  sweats, unintended weight changes. She denies hemoptysis, and any new musculoskeletal or joint aches or pains. No other complaints are verbalized.     PHYSICAL EXAM:   Today's Vitals   04/03/20 1000  BP: (!) 151/52  Pulse: (!) 59  Resp: 20  Temp: 98.2 F (36.8  C)  SpO2: 98%  Weight: 158 lb 6.4 oz (71.8 kg)  Height: 5\' 5"  (1.651 m)  PainSc: 0-No pain    In general this is a well appearing caucasian female in no acute distress. She's alert and oriented x4 and appropriate throughout the examination. Cardiopulmonary assessment is negative for acute distress and she exhibits normal effort.     ECOG = 0  0 - Asymptomatic (Fully active, able to carry on all predisease activities without restriction)  1 - Symptomatic but completely ambulatory (Restricted in physically strenuous activity but ambulatory and able to carry out work of a light or sedentary nature. For example, light housework, office work)  2 - Symptomatic, <50% in bed during the day (Ambulatory and capable of all self care but unable to carry out any work activities. Up and about more than 50% of waking hours)  3 - Symptomatic, >50% in bed, but not bedbound (Capable of only limited self-care, confined to bed or chair 50% or more of waking hours)  4 - Bedbound (Completely disabled. Cannot carry on any self-care. Totally confined to bed or chair)  5 - Death   Eustace Pen MM, Creech RH, Tormey DC, et al. [phone]). "Toxicity and response criteria of the [facility]". [place] Oncol. 5 (6): 649-55    LABORATORY DATA:  Lab Results  Component Value Date   WBC 6.6 03/06/2020   HGB 15.0 03/06/2020   HCT 46.7 (H) 03/06/2020   MCV 95.1 03/06/2020   PLT 145 (L) 03/06/2020   Lab Results  Component Value Date   NA 138 03/06/2020   K 4.0 03/06/2020   CL 104 03/06/2020   CO2 25 03/06/2020   Lab Results  Component Value Date   ALT 36 03/06/2020   AST 39 03/06/2020   ALKPHOS 95 03/06/2020   BILITOT  1.0 03/06/2020      RADIOGRAPHY: NM PET Image Initial (PI) Skull Base To Thigh  Result Date: 03/30/2020 CLINICAL DATA:  Subsequent treatment strategy for lung carcinoma. RIGHT upper lobe biopsy. EXAM: NUCLEAR MEDICINE PET SKULL BASE TO THIGH TECHNIQUE: 7.6 mCi F-18 FDG was injected intravenously. Full-ring PET imaging was performed from the skull base to thigh after the radiotracer. CT data was obtained and used for attenuation correction and anatomic localization. Fasting blood glucose: 90 mg/dl COMPARISON:  CT 01/06/2019 FINDINGS: Mediastinal blood pool activity: SUV max 2.6 Liver activity: SUV max NA NECK: No hypermetabolic lymph nodes in the neck. Incidental CT findings: none CHEST: RIGHT upper lobe ground-glass nodule has mild radiotracer activity (SUV max equal 1.7). No additional enlarged or hypermetabolic nodules. Fiduciary markers noted in the RIGHT upper lobe. No hypermetabolic mediastinal lymph nodes. No supraclavicular nodes. Incidental CT findings: Midline sternotomy.  Post CABG ABDOMEN/PELVIS: No abnormal hypermetabolic activity within the liver, pancreas, adrenal glands, or spleen. No hypermetabolic lymph nodes in the abdomen or pelvis. Incidental CT findings: None SKELETON: No focal hypermetabolic activity to suggest skeletal metastasis. Incidental CT findings: none IMPRESSION: 1. Low metabolic activity associated with the RIGHT upper lobe pulmonary nodule. The findings are nonspecific but not inconsistent with low-grade adenocarcinoma. 2. No evidence of metastatic adenopathy or distant metastatic disease Electronically Signed   By: Suzy Bouchard M.D.   On: 03/30/2020 16:44   DG Chest Port 1 View  Result Date: 03/06/2020 CLINICAL DATA:  Status post bronchoscopy. EXAM: PORTABLE CHEST 1 VIEW COMPARISON:  February 03, 2019.  January 06, 2020. FINDINGS: Stable cardiomediastinal silhouette. Sternotomy wires are noted left lung is clear.  No pneumothorax or pleural effusion is noted. Increased  density is noted in the right upper lobe which most likely represents small contusion related to biopsy. Three metallic clips are noted in this area as well. Bony thorax is unremarkable. IMPRESSION: No pneumothorax status post right lung biopsy. Increased density is noted in right upper lobe which most likely represents small contusion related to biopsy. Electronically Signed   By: Marijo Conception M.D.   On: 03/06/2020 14:16   DG C-ARM BRONCHOSCOPY  Result Date: 03/06/2020 C-ARM BRONCHOSCOPY: Fluoroscopy was utilized by the requesting physician.  No radiographic interpretation.       IMPRESSION/PLAN: 1. Stage IA2, cT1bN0M0, NSCLC, adenocarcinoma of the RUL. Dr. Lisbeth Renshaw discusses the pathology findings and reviews the nature of early stage lung cancer.  He reviews that while surgery remains the standard of care, radiotherapy is an acceptable alternative in patients who are not candidates for surgery or for those who wish to forgo surgical resection. Dr. Lisbeth Renshaw discusses that he would offer a course of stereotactic body radiotherapy (SBRT) in her situation which she is more in favor of. We discussed the risks, benefits, short, and long term effects of radiotherapy, as well as the curative intent, and the patient is interested in proceeding. Dr. Lisbeth Renshaw discusses the delivery and logistics of radiotherapy and anticipates a course of 3-5 fractions of radiotherapy. Written consent is obtained and placed in the chart, a copy was provided to the patient. She will simulate on 04/11/20 at 9:30 am.   In a visit lasting 60 minutes, greater than 50% of the time was spent face to face discussing the patient's condition, in preparation for the discussion, and coordinating the patient's care.   The above documentation reflects my direct findings during this shared patient visit. Please see the separate note by Dr. Lisbeth Renshaw on this date for the remainder of the patient's plan of care.    Carola Rhine, PAC

## 2020-04-11 ENCOUNTER — Other Ambulatory Visit: Payer: Self-pay

## 2020-04-11 ENCOUNTER — Ambulatory Visit
Admission: RE | Admit: 2020-04-11 | Discharge: 2020-04-11 | Disposition: A | Discharge status: Home / Self Care | Payer: Medicare Other | Source: Ambulatory Visit | Attending: Radiation Oncology | Admitting: Radiation Oncology

## 2020-04-11 ENCOUNTER — Ambulatory Visit: Payer: Medicare Other | Admitting: Emergency Medicine

## 2020-04-11 DIAGNOSIS — Z510 Encounter for antineoplastic radiation therapy: Secondary | ICD-10-CM | POA: Insufficient documentation

## 2020-04-11 DIAGNOSIS — Z87891 Personal history of nicotine dependence: Secondary | ICD-10-CM | POA: Diagnosis not present

## 2020-04-11 DIAGNOSIS — C3411 Malignant neoplasm of upper lobe, right bronchus or lung: Secondary | ICD-10-CM | POA: Diagnosis not present

## 2020-04-17 DIAGNOSIS — Z510 Encounter for antineoplastic radiation therapy: Secondary | ICD-10-CM | POA: Diagnosis not present

## 2020-04-17 DIAGNOSIS — Z87891 Personal history of nicotine dependence: Secondary | ICD-10-CM | POA: Diagnosis not present

## 2020-04-17 DIAGNOSIS — C3411 Malignant neoplasm of upper lobe, right bronchus or lung: Secondary | ICD-10-CM | POA: Diagnosis not present

## 2020-04-18 ENCOUNTER — Other Ambulatory Visit: Payer: Self-pay

## 2020-04-18 ENCOUNTER — Ambulatory Visit
Admission: RE | Admit: 2020-04-18 | Discharge: 2020-04-18 | Disposition: A | Discharge status: Home / Self Care | Payer: Medicare Other | Source: Ambulatory Visit | Attending: Radiation Oncology | Admitting: Radiation Oncology

## 2020-04-18 DIAGNOSIS — Z87891 Personal history of nicotine dependence: Secondary | ICD-10-CM | POA: Diagnosis not present

## 2020-04-18 DIAGNOSIS — Z510 Encounter for antineoplastic radiation therapy: Secondary | ICD-10-CM | POA: Diagnosis not present

## 2020-04-18 DIAGNOSIS — C3411 Malignant neoplasm of upper lobe, right bronchus or lung: Secondary | ICD-10-CM | POA: Diagnosis not present

## 2020-04-19 ENCOUNTER — Ambulatory Visit: Payer: Medicare Other | Admitting: Radiation Oncology

## 2020-04-23 ENCOUNTER — Ambulatory Visit: Payer: Medicare Other

## 2020-04-23 ENCOUNTER — Other Ambulatory Visit: Payer: Self-pay | Admitting: Internal Medicine

## 2020-04-23 ENCOUNTER — Ambulatory Visit: Payer: Medicare Other | Admitting: Radiation Oncology

## 2020-04-24 ENCOUNTER — Ambulatory Visit
Admission: RE | Admit: 2020-04-24 | Discharge: 2020-04-24 | Disposition: A | Discharge status: Home / Self Care | Payer: Medicare Other | Source: Ambulatory Visit | Attending: Radiation Oncology | Admitting: Radiation Oncology

## 2020-04-24 ENCOUNTER — Other Ambulatory Visit: Payer: Self-pay

## 2020-04-24 DIAGNOSIS — Z510 Encounter for antineoplastic radiation therapy: Secondary | ICD-10-CM | POA: Diagnosis not present

## 2020-04-24 DIAGNOSIS — C3411 Malignant neoplasm of upper lobe, right bronchus or lung: Secondary | ICD-10-CM | POA: Diagnosis not present

## 2020-04-25 ENCOUNTER — Ambulatory Visit: Payer: Medicare Other | Admitting: Radiation Oncology

## 2020-04-26 ENCOUNTER — Encounter: Payer: Self-pay | Admitting: Radiation Oncology

## 2020-04-26 ENCOUNTER — Other Ambulatory Visit: Payer: Self-pay

## 2020-04-26 ENCOUNTER — Ambulatory Visit
Admission: RE | Admit: 2020-04-26 | Discharge: 2020-04-26 | Disposition: A | Discharge status: Home / Self Care | Payer: Medicare Other | Source: Ambulatory Visit | Attending: Radiation Oncology | Admitting: Radiation Oncology

## 2020-04-26 DIAGNOSIS — Z510 Encounter for antineoplastic radiation therapy: Secondary | ICD-10-CM | POA: Diagnosis not present

## 2020-04-26 DIAGNOSIS — C3411 Malignant neoplasm of upper lobe, right bronchus or lung: Secondary | ICD-10-CM | POA: Diagnosis not present

## 2020-05-02 ENCOUNTER — Other Ambulatory Visit: Payer: Self-pay | Admitting: Gastroenterology

## 2020-05-08 ENCOUNTER — Telehealth: Payer: Medicare Other

## 2020-05-08 DIAGNOSIS — D692 Other nonthrombocytopenic purpura: Secondary | ICD-10-CM | POA: Diagnosis not present

## 2020-05-08 DIAGNOSIS — D1801 Hemangioma of skin and subcutaneous tissue: Secondary | ICD-10-CM | POA: Diagnosis not present

## 2020-05-08 DIAGNOSIS — D225 Melanocytic nevi of trunk: Secondary | ICD-10-CM | POA: Diagnosis not present

## 2020-05-08 DIAGNOSIS — L821 Other seborrheic keratosis: Secondary | ICD-10-CM | POA: Diagnosis not present

## 2020-05-12 ENCOUNTER — Encounter: Payer: Self-pay | Admitting: Internal Medicine

## 2020-05-15 ENCOUNTER — Encounter: Payer: Self-pay | Admitting: Internal Medicine

## 2020-05-16 NOTE — Telephone Encounter (Signed)
Patient sent email regarding next steps after radiation  Dr. Lamonte Sakai please advise.   Dr. Rowe Clack have finished my cancer radiation treatments as of December 30th. What is the next procedure or cat scan to determine whether the radiation has cured the nodule?    Do you determine my next move or does the Radiologist? I assume it is you. So, I would like to know when the cat scan or whatever is to be scheduled and how often it is scheduled there after. I remember the radiologist saying in 6 months after the last treatment and then another six months after that. If all is clear it is yearly. w  Please let me know who schedules this and what it is.     Of course it is on my mind and I need to know who is taking charge of my treatment and what it is to be.   Thanks,     [employer]

## 2020-05-21 ENCOUNTER — Ambulatory Visit
Admission: RE | Admit: 2020-05-21 | Discharge: 2020-05-21 | Disposition: A | Discharge status: Home / Self Care | Payer: Medicare Other | Source: Ambulatory Visit | Attending: Radiation Oncology | Admitting: Radiation Oncology

## 2020-05-21 DIAGNOSIS — C3411 Malignant neoplasm of upper lobe, right bronchus or lung: Secondary | ICD-10-CM | POA: Insufficient documentation

## 2020-05-22 NOTE — Progress Notes (Signed)
  Radiation Oncology         [phone] ________________________________  Name: [name] MRN: [id number]  Date of Service: 05/21/2020  DOB: 1933/02/06  Post Treatment Telephone Note  Diagnosis:  Stage IA2, cT1bN0M0, NSCLC, adenocarcinoma of the RUL.  Interval Since Last Radiation:  4 weeks   04/18/20-04/26/20 SBRT Treatment: The RUL target was treated to 54 Gy in 3 fractions  About 2006: The patient received adjuvant right breast radiotherapy. Details are not known at this point about her dosing.  Narrative:  The patient was contacted today for routine follow-up. During treatment she did very well with radiotherapy and did not have significant desquamation. She reports she is doing well since radiotherapy but is curious about the plans for follow up.  Impression/Plan: 1. Stage IA2, cT1bN0M0, NSCLC, adenocarcinoma of the RUL. The patient has been doing well since completion of radiotherapy. We discussed that we would plan to proceed with a CT in about 2-4 weeks and subsequently at 6 month intervals per NCCN guidelines. She is in agreement and also knows to follow up with Dr. Lamonte Sakai this spring. 2. History of breast cancer. We discussed the importance of survivorship evaluation was reviewed and encouraged her to attend her upcoming visit with that clinic.     Carola Rhine, PAC

## 2020-05-23 ENCOUNTER — Other Ambulatory Visit: Payer: Self-pay | Admitting: Radiation Oncology

## 2020-05-23 ENCOUNTER — Other Ambulatory Visit: Payer: Self-pay

## 2020-05-23 ENCOUNTER — Other Ambulatory Visit: Payer: Self-pay | Admitting: Internal Medicine

## 2020-05-23 ENCOUNTER — Telehealth: Payer: Self-pay | Admitting: *Deleted

## 2020-05-23 DIAGNOSIS — C3411 Malignant neoplasm of upper lobe, right bronchus or lung: Secondary | ICD-10-CM

## 2020-05-23 NOTE — Telephone Encounter (Signed)
CALLED PATIENT TO INFORM OF STAT LABS FOR 06-20-20 @ 9:45 AM @ [facility] AND HER CT FOR 06-20-20- ARRIVAL TIME- 10:45 AM @ [facility], PATIENT TO HAVE WATER ONLY - 4 HRS. PRIOR TO TEST, PATIENT TO RECEIVE RESULTS FROM ALISON PERKINS ON 06-25-20 @ 2 PM VIA TELEPHONE, LVM FOR A RETURN CALL

## 2020-05-24 ENCOUNTER — Telehealth: Payer: Self-pay | Admitting: *Deleted

## 2020-05-24 NOTE — Telephone Encounter (Signed)
Called patient to inform that it is ok to have lab and scan on 06-06-20 per approval from PA Shona Simpson, patient verified understanding this

## 2020-05-24 NOTE — Progress Notes (Signed)
  Radiation Oncology         [phone] ________________________________  Name: [name] MRN: [id number]  Date: 04/26/2020  DOB: 1932-09-20  End of Treatment Note  Diagnosis:   stage I adenocarcinoma of the right upper lobe     Indication for treatment::  curative       Radiation treatment dates:   04/18/20 - 04/26/20  Site/dose:   The patient was treated to the right lung with a course of stereotactic body radiation treatment.  The patient received 54 Gray in 3 fractions using a IMRT/SBRT technique, with 3 fields.  Narrative: The patient tolerated radiation treatment relatively well.   No unexpected difficulties.  The patient's breathing did not significantly change during the course of the treatment.  Plan: The patient has completed radiation treatment. The patient will return to radiation oncology clinic for routine followup in one month. I advised the patient to call or return sooner if they have any questions or concerns related to their recovery or treatment. ________________________________  Jodelle Gross, M.D., Ph.D.

## 2020-05-28 ENCOUNTER — Other Ambulatory Visit: Payer: Self-pay | Admitting: Internal Medicine

## 2020-05-28 NOTE — Telephone Encounter (Signed)
Please let her know that Radiation Oncology usually arranges for a follow up CT scan at the 6 month mark to assess response to treatment. She will hear from them to arrange. If for some reason they do not arrange for it then I will order it. We will both be following the scan.

## 2020-05-30 ENCOUNTER — Other Ambulatory Visit: Payer: Self-pay

## 2020-05-30 ENCOUNTER — Ambulatory Visit
Admission: RE | Admit: 2020-05-30 | Discharge: 2020-05-30 | Disposition: A | Discharge status: Home / Self Care | Payer: Medicare Other | Source: Ambulatory Visit | Attending: Internal Medicine | Admitting: Internal Medicine

## 2020-05-30 DIAGNOSIS — Z0000 Encounter for general adult medical examination without abnormal findings: Secondary | ICD-10-CM

## 2020-05-30 DIAGNOSIS — Z1231 Encounter for screening mammogram for malignant neoplasm of breast: Secondary | ICD-10-CM | POA: Diagnosis not present

## 2020-06-06 ENCOUNTER — Other Ambulatory Visit: Payer: Self-pay

## 2020-06-06 ENCOUNTER — Ambulatory Visit (HOSPITAL_COMMUNITY)
Admission: RE | Admit: 2020-06-06 | Discharge: 2020-06-06 | Disposition: A | Discharge status: Home / Self Care | Payer: Medicare Other | Source: Ambulatory Visit | Attending: Radiation Oncology | Admitting: Radiation Oncology

## 2020-06-06 ENCOUNTER — Ambulatory Visit
Admission: RE | Admit: 2020-06-06 | Discharge: 2020-06-06 | Disposition: A | Discharge status: Home / Self Care | Payer: Medicare Other | Source: Ambulatory Visit | Attending: Radiation Oncology | Admitting: Radiation Oncology

## 2020-06-06 DIAGNOSIS — C3411 Malignant neoplasm of upper lobe, right bronchus or lung: Secondary | ICD-10-CM | POA: Diagnosis not present

## 2020-06-06 DIAGNOSIS — C3490 Malignant neoplasm of unspecified part of unspecified bronchus or lung: Secondary | ICD-10-CM | POA: Diagnosis not present

## 2020-06-06 LAB — BUN & CREATININE (CHCC)
BUN: 16 mg/dL (ref 8–23)
Creatinine: 0.9 mg/dL (ref 0.44–1.00)
GFR, Estimated: >60 mL/min (ref >60)

## 2020-06-06 MED ORDER — IOHEXOL 300 MG/ML  SOLN
75.0000 mL | Freq: Once | INTRAMUSCULAR | Status: AC | PRN
  Administered 2020-06-06: 75 mL via INTRAVENOUS

## 2020-06-06 NOTE — Progress Notes (Signed)
Goodness- she is not going to like the description in the impression. Do you think this is all just post radiation change from her recent SBRT? When I spoke with her a few weeks ago she didn't have symptoms of pneumonitis.

## 2020-06-07 ENCOUNTER — Telehealth: Payer: Self-pay | Admitting: Radiation Oncology

## 2020-06-07 ENCOUNTER — Encounter: Payer: Self-pay | Admitting: Radiation Oncology

## 2020-06-07 DIAGNOSIS — C3490 Malignant neoplasm of unspecified part of unspecified bronchus or lung: Secondary | ICD-10-CM

## 2020-06-07 NOTE — Telephone Encounter (Signed)
The patient had sent me a MyChart message about the results of her CT scan, I had been working with Dr. Lisbeth Renshaw to review her scan in comparison to her pretreatment imaging.  In summary she had an early stage I lung cancer that was treated with stereotactic radiation, and her scan posttreatment 6 weeks later was concerning for an increase in the right upper lobe nodule with what was previously groundglass and now more of a nodule with solid component.  I spoke with the reading radiologist as well who was not aware of her previous radiation treatment and felt that these changes could certainly represent more of an inflammatory and postradiation appearance.  Dr. Lisbeth Renshaw recommends repeating a scan 3 to 4 months to follow back up on any changes in a shorter interval than moving to 24-month intervals.  The patient is in agreement with this plan.  She requests that she have frequent future scans reviewed by Dr. Leonia Reeves and Dr. Lin Landsman who is also been reading previous scans of hers.

## 2020-06-07 NOTE — Telephone Encounter (Signed)
Patient sent email   Dr. Freda Munro,     This result seems to be bad. Seems like the nodule is larger than it was before the radiation. Can you please tell me the results and what do I do next. I thought the radiation would be helpful in reducing or eliminating the nodule.     Please answer my email and tell me what the results are specifically and what can I do next.         Thanks,           [name]  Dr. Lamonte Sakai please advise

## 2020-06-14 NOTE — Telephone Encounter (Signed)
This is almost certainly scar post-XRT. I se that she is discussing w Rad Onc.

## 2020-06-20 ENCOUNTER — Ambulatory Visit (HOSPITAL_COMMUNITY): Payer: Medicare Other

## 2020-06-20 ENCOUNTER — Ambulatory Visit: Payer: Medicare Other

## 2020-06-25 ENCOUNTER — Ambulatory Visit: Payer: Self-pay | Admitting: Radiation Oncology

## 2020-06-26 ENCOUNTER — Telehealth (INDEPENDENT_AMBULATORY_CARE_PROVIDER_SITE_OTHER): Payer: Medicare Other | Admitting: Family

## 2020-06-26 ENCOUNTER — Encounter: Payer: Self-pay | Admitting: Internal Medicine

## 2020-06-26 DIAGNOSIS — J209 Acute bronchitis, unspecified: Secondary | ICD-10-CM

## 2020-06-26 MED ORDER — DOXYCYCLINE HYCLATE 100 MG PO TABS: 100.0000 mg | ORAL_TABLET | Freq: Two times a day (BID) | ORAL | 0 refills | Status: DC

## 2020-06-26 NOTE — Progress Notes (Signed)
[name] is a 87 y.o. female with the following history as recorded in EpicCare:  Patient Active Problem List   Diagnosis Date Noted  . Aortic atherosclerosis ([place]) 01/06/2020  . Cough 12/22/2019  . Palpitations 12/07/2019  . Atrial fibrillation ([place]) 12/07/2019  . PUD (peptic ulcer disease) 12/07/2019  . Dyspnea on exertion 12/07/2019  . Eczema 07/27/2019  . CAD (coronary artery disease) 06/23/2019  . Epistaxis 04/19/2019  . NSTEMI (non-ST elevated myocardial infarction) ([place])   . Iron deficiency 12/26/2018  . Anemia 12/26/2018  . B12 deficiency 12/23/2018  . Discoloration of skin of finger 11/11/2018  . Chronic gastric ulcer without hemorrhage and without perforation 06/05/2018  . Constipation, chronic 06/05/2018  . Callus of foot 03/04/2018  . Hammertoes of both feet 03/04/2018  . History of peptic ulcer disease 12/21/2017  . Hypertension 12/21/2017  . Syncope 12/14/2017  . Macrocytic anemia 12/14/2017  . Near syncope 12/14/2017  . Left sided numbness 10/19/2017  . Small vessel disease, cerebrovascular 10/19/2017  . Numbness and tingling of left arm and leg 10/13/2017  . Anxiety 10/02/2017  . Macular pucker, right eye 07/24/2017  . Pseudophakia of both eyes 07/24/2017  . Prediabetes 09/29/2016  . Malignant neoplasm of upper lobe of right lung ([place]) 09/29/2016  . Leg cramps 01/29/2016  . Insomnia 09/12/2015  . Rectal bleed 09/12/2015  . S/P AVR 11/16/2014  . Branch retinal vein occlusion 10/12/2013  . Iris nevus, right 10/12/2013  . Aortic valve stenosis, severe   . Allergic rhinitis 09/10/2009  . Hyperlipidemia 11/30/2006  . Mitral valve disease 10/29/2006  . Osteopenia 10/29/2006  . History of right breast cancer 10/29/2006    Current Outpatient Medications  Medication Sig Dispense Refill  . doxycycline (VIBRA-TABS) 100 MG tablet Take 1 tablet (100 mg total) by mouth 2 (two) times daily. 20 tablet 0  . acetaminophen (TYLENOL) 500 MG tablet Take  500-1,000 mg by mouth every 6 (six) hours as needed for mild pain or moderate pain.     Marland Kitchen ALPRAZolam (XANAX) 0.5 MG tablet TAKE 1 TABLET ONCE DAILY AS NEEDED FOR AN ANXIETY, DO NOT TAKE AT NIGHT. 90 tablet 0  . amiodarone (PACERONE) 200 MG tablet Take 0.5 tablets (100 mg total) by mouth daily. 45 tablet 3  . apixaban (ELIQUIS) 5 MG TABS tablet Take 1 tablet (5 mg total) by mouth 2 (two) times daily. Okay to restart this medication on 03/07/2020. 60 tablet 6  . Calcium Carb-Cholecalciferol (CALCIUM 600+D3) 600-400 MG-UNIT TABS Take 1 tablet by mouth daily.    . Chloral Hydrate CRYS Take 10 mLs by mouth at bedtime as needed (sleeping).    . Cholecalciferol (VITAMIN D-3) 1000 UNITS CAPS Take 1,000 Units by mouth daily.     . clindamycin (CLEOCIN) 300 MG capsule Take 600 mg by mouth See admin instructions. Take 600 mg 1 hour prior to dental work    . ferrous sulfate 325 (65 FE) MG tablet Take 325 mg by mouth every Monday, Wednesday, and Friday.    . fluticasone (FLONASE) 50 MCG/ACT nasal spray Place 2 sprays into both nostrils daily as needed for allergies.    . furosemide (LASIX) 20 MG tablet Take 0.5 tablets (10 mg total) by mouth daily.    Marland Kitchen HYDROcodone-homatropine (HYCODAN) 5-1.5 MG/5ML syrup Take 5 mLs by mouth every 8 (eight) hours as needed for cough. 120 mL 0  . levocetirizine (XYZAL) 5 MG tablet Take 5 mg by mouth at bedtime as needed for allergies.     Marland Kitchen  montelukast (SINGULAIR) 10 MG tablet TAKE 1 TABLET EACH DAY. 90 tablet 1  . nitroGLYCERIN (NITROSTAT) 0.4 MG SL tablet Place 1 tablet (0.4 mg total) under the tongue every 5 (five) minutes as needed for chest pain (CP or SOB). 25 tablet 3  . Ophthalmic Irrigation Solution (OCUSOFT EYE [place] OP) Apply 1 application to eye at bedtime.     . pantoprazole (PROTONIX) 40 MG tablet TAKE 1 TABLET BY MOUTH TWICE DAILY. 180 tablet 0  . polyethylene glycol (MIRALAX / GLYCOLAX) 17 g packet Take 17 g by mouth every other day.    . potassium chloride  (KLOR-CON) 10 MEQ tablet Take 1 tablet (10 mEq total) by mouth daily. 90 tablet 3  . rosuvastatin (CRESTOR) 20 MG tablet Take 1 tablet (20 mg total) by mouth daily. 90 tablet 3  . tretinoin (RETIN-A) 0.1 % cream Apply 1 application topically every Monday, Wednesday, and Friday.     . Wheat Dextrin (BENEFIBER DRINK MIX PO) Take 1 Dose by mouth every other day.      No current facility-administered medications for this visit.    Allergies: Lactose intolerance (gi), Amoxicillin-pot clavulanate, Penicillins, Sulfa antibiotics, and Sulfonamide derivatives  Past Medical History:  Diagnosis Date  . Allergy   . Anemia   . Anxiety   . Aortic valve stenosis, severe    s/p AVR // mild to mod paravalvular leakage // Echo 9/21: EF 55-60, no RWMA, mild LVH, GR 2 DD, mildly reduced RVSF, RVSP 38.7, moderate LAE, mild RAE, AVR with mean gradient 11 mmHg, mild paravalvular leakage  . Breast cancer ([place]) 2007   right  . Cataract   . Constipation   . Dysrhythmia    A-fib  . Hiatal hernia    pt denies  . Hypercholesteremia   . Hypertension   . Hypoglycemia   . Osteopenia   . Personal history of radiation therapy   . Tingling   . Ulcer 2012   bleeding gastric Ulcer    Past Surgical History:  Procedure Laterality Date  . AORTIC VALVE REPLACEMENT N/A 11/16/2014   Procedure: AORTIC VALVE REPLACEMENT (AVR);  Surgeon: Gaye Pollack, MD;  Location: [place];  Service: Open Heart Surgery;  Laterality: N/A;  . BIOPSY  12/15/2017   Procedure: BIOPSY;  Surgeon: Rush Landmark Telford Nab., MD;  Location: [place];  Service: Gastroenterology;;  . BREAST BIOPSY    . BREAST LUMPECTOMY Right    2005?  Marland Kitchen BREAST SURGERY    . BRONCHIAL BIOPSY  03/06/2020   Procedure: BRONCHIAL BIOPSIES;  Surgeon: Collene Gobble, MD;  Location[facility] ENDOSCOPY;  Service: Pulmonary;;  . BRONCHIAL BRUSHINGS  03/06/2020   Procedure: BRONCHIAL BRUSHINGS;  Surgeon: Collene Gobble, MD;  Location: [facility] ENDOSCOPY;  Service: Pulmonary;;  .  BRONCHIAL NEEDLE ASPIRATION BIOPSY  03/06/2020   Procedure: BRONCHIAL NEEDLE ASPIRATION BIOPSIES;  Surgeon: Collene Gobble, MD;  Location[facility] ENDOSCOPY;  Service: Pulmonary;;  . BRONCHIAL WASHINGS  03/06/2020   Procedure: BRONCHIAL WASHINGS;  Surgeon: Collene Gobble, MD;  Location: [facility] ENDOSCOPY;  Service: Pulmonary;;  . CARDIAC CATHETERIZATION N/A 10/18/2014   Procedure: Right/Left Heart Cath and Coronary Angiography;  Surgeon: [name], MD;  Location: [place] CV LAB;  Service: Cardiovascular;  Laterality: N/A;  . CARDIOVERSION N/A 12/09/2019   Procedure: CARDIOVERSION;  Surgeon: Josue Hector, MD;  Location: [facility] ENDOSCOPY;  Service: Cardiovascular;  Laterality: N/A;  . CARDIOVERSION N/A 12/30/2019   Procedure: CARDIOVERSION;  Surgeon: Josue Hector, MD;  Location: [facility]  ENDOSCOPY;  Service: Cardiovascular;  Laterality: N/A;  . CORONARY ANGIOGRAPHY N/A 02/04/2019   Procedure: CORONARY ANGIOGRAPHY;  Surgeon: Nelva Bush, MD;  Location: [place] CV LAB;  Service: Cardiovascular;  Laterality: N/A;  . COSMETIC SURGERY     face  . ESOPHAGOGASTRODUODENOSCOPY (EGD) WITH PROPOFOL N/A 12/15/2017   Procedure: ESOPHAGOGASTRODUODENOSCOPY (EGD) WITH PROPOFOL;  Surgeon: Rush Landmark Telford Nab., MD;  Location: [place];  Service: Gastroenterology;  Laterality: N/A;  . FIDUCIAL MARKER PLACEMENT  03/06/2020   Procedure: FIDUCIAL MARKER PLACEMENT;  Surgeon: Collene Gobble, MD;  Location: [facility] ENDOSCOPY;  Service: Pulmonary;;  . PARATHYROIDECTOMY    . TEE WITHOUT CARDIOVERSION N/A 11/16/2014   Procedure: TRANSESOPHAGEAL ECHOCARDIOGRAM (TEE);  Surgeon: Gaye Pollack, MD;  Location: [place];  Service: Open Heart Surgery;  Laterality: N/A;  . TEE WITHOUT CARDIOVERSION N/A 12/09/2019   Procedure: TRANSESOPHAGEAL ECHOCARDIOGRAM (TEE);  Surgeon: Josue Hector, MD;  Location: [facility] ENDOSCOPY;  Service: Cardiovascular;  Laterality: N/A;  . TONSILLECTOMY    . VIDEO BRONCHOSCOPY WITH ENDOBRONCHIAL NAVIGATION  Right 03/06/2020   Procedure: VIDEO BRONCHOSCOPY WITH ENDOBRONCHIAL NAVIGATION;  Surgeon: Collene Gobble, MD;  Location: [facility] ENDOSCOPY;  Service: Pulmonary;  Laterality: Right;    Family History  Problem Relation Age of Onset  . Hypertension Mother   . Rectal cancer Mother   . Colon cancer Mother   . Kidney disease Father   . Cancer Cousin 63       breast and ovarian   . Esophageal cancer Neg Hx   . Stomach cancer Neg Hx   . Colon polyps Neg Hx     Social History   Tobacco Use  . Smoking status: Former Smoker    Packs/day: 0.50    Years: 15.00    Pack years: 7.50    Types: Cigarettes    Start date: 2    Quit date: 1986    Years since quitting: 36.1  . Smokeless tobacco: Never Used  . Tobacco comment: quit smoking 1980  Substance Use Topics  . Alcohol use: Yes    Alcohol/week: 7.0 standard drinks    Types: 7 Glasses of wine per week    Comment: wine nightly with dinner    Subjective:   I connected with Ladaysha Soutar Javed on 06/26/20 at  9:40 AM EST by a telephone call and verified that I am speaking with the correct person using two identifiers.   I discussed the limitations of evaluation and management by telemedicine and the availability of in person appointments. The patient expressed understanding and agreed to proceed. Provider in office/ patient is at home; provider and patient are only 2 people on telephone call.   2-3 days history of cough/ congestion; prone to bronchitis; requesting antibiotic; no concern for COVID exposure at this time; no fever or chest pain or shortness of breath; using cough syrup with some relief;    Objective:  There were no vitals filed for this visit.   Lungs: Respirations unlabored;  Neurologic: Alert and oriented; speech intact;  Assessment:  1. Acute bronchitis, unspecified organism     Plan:  Rx for Doxycycline 100 mg bid x 10 days; she will plan for COVID testing if symptoms not improving in 48 hours; increase fluids,  rest and follow-up worse, no better.   Time spent 10 minutes  No follow-ups on file.  No orders of the defined types were placed in this encounter.   Requested Prescriptions   Signed Prescriptions Disp Refills  . doxycycline (VIBRA-TABS)  100 MG tablet 20 tablet 0    Sig: Take 1 tablet (100 mg total) by mouth 2 (two) times daily.

## 2020-06-26 NOTE — Progress Notes (Signed)
LVM instructing pt that [name] will be meeting with her soon.

## 2020-06-27 ENCOUNTER — Telehealth: Payer: Self-pay | Admitting: Nurse Practitioner

## 2020-06-27 ENCOUNTER — Other Ambulatory Visit: Payer: Self-pay | Admitting: Family

## 2020-06-27 ENCOUNTER — Encounter: Payer: Self-pay | Admitting: Internal Medicine

## 2020-06-27 DIAGNOSIS — J069 Acute upper respiratory infection, unspecified: Secondary | ICD-10-CM

## 2020-06-27 NOTE — Telephone Encounter (Signed)
Moved upcoming appointment due to provider's template. Patient is aware of changes. 

## 2020-06-27 NOTE — Telephone Encounter (Signed)
   Please call patient to arrange COVID test

## 2020-06-28 DIAGNOSIS — J069 Acute upper respiratory infection, unspecified: Secondary | ICD-10-CM | POA: Diagnosis not present

## 2020-06-29 ENCOUNTER — Telehealth: Payer: Self-pay | Admitting: Internal Medicine

## 2020-06-29 ENCOUNTER — Other Ambulatory Visit: Payer: Self-pay | Admitting: Internal Medicine

## 2020-06-29 ENCOUNTER — Encounter: Payer: Self-pay | Admitting: Internal Medicine

## 2020-06-29 LAB — NOVEL CORONAVIRUS, NAA: SARS-CoV-2, NAA: NOT DETECTED

## 2020-06-29 LAB — SARS-COV-2, NAA 2 DAY TAT

## 2020-06-29 NOTE — Telephone Encounter (Signed)
[facility] [facility] calling stating they received the e-script but they cant get the e-signature to verify. They are asking for [place] to call and give it verbally or send in another e-script and see if it works.  [place], [place] Phone:  [phone]  Fax:  [phone]

## 2020-06-29 NOTE — Telephone Encounter (Signed)
Spoke with pharmacy today

## 2020-07-02 ENCOUNTER — Encounter: Payer: Self-pay | Admitting: Internal Medicine

## 2020-07-03 DIAGNOSIS — Z961 Presence of intraocular lens: Secondary | ICD-10-CM | POA: Diagnosis not present

## 2020-07-03 DIAGNOSIS — H35371 Puckering of macula, right eye: Secondary | ICD-10-CM | POA: Diagnosis not present

## 2020-07-03 DIAGNOSIS — D3141 Benign neoplasm of right ciliary body: Secondary | ICD-10-CM | POA: Diagnosis not present

## 2020-07-03 DIAGNOSIS — H02403 Unspecified ptosis of bilateral eyelids: Secondary | ICD-10-CM | POA: Diagnosis not present

## 2020-07-03 DIAGNOSIS — H04123 Dry eye syndrome of bilateral lacrimal glands: Secondary | ICD-10-CM | POA: Diagnosis not present

## 2020-07-03 DIAGNOSIS — H348312 Tributary (branch) retinal vein occlusion, right eye, stable: Secondary | ICD-10-CM | POA: Diagnosis not present

## 2020-07-06 ENCOUNTER — Other Ambulatory Visit: Payer: Self-pay | Admitting: *Deleted

## 2020-07-06 ENCOUNTER — Encounter: Payer: Self-pay | Admitting: Internal Medicine

## 2020-07-06 ENCOUNTER — Other Ambulatory Visit: Payer: Self-pay | Admitting: Family

## 2020-07-06 ENCOUNTER — Other Ambulatory Visit: Payer: Self-pay | Admitting: Internal Medicine

## 2020-07-06 MED ORDER — HYDROCOD POLST-CPM POLST ER 10-8 MG/5ML PO SUER: 5.0000 mL | Freq: Every evening | ORAL | 0 refills | Status: DC | PRN

## 2020-07-06 MED ORDER — APIXABAN 5 MG PO TABS
5.0000 mg | ORAL_TABLET | Freq: Two times a day (BID) | ORAL | 6 refills | Status: DC
Start: 2020-07-06 — End: 2021-02-07

## 2020-07-06 NOTE — Telephone Encounter (Signed)
Prescription refill request for Eliquis received. Indication:Afib Last office visit: 01/23/2020 Scr: 0.97, 03/06/2020 Age: 87 yo  Weight: 71.8 kg   Pt is on the correct dose of Eliquis per dosing criteria, prescription refill sent for Eliquis 5mg  bid.

## 2020-07-09 ENCOUNTER — Other Ambulatory Visit: Payer: Self-pay

## 2020-07-09 ENCOUNTER — Encounter: Payer: Self-pay | Admitting: Internal Medicine

## 2020-07-09 MED ORDER — AMIODARONE HCL 100 MG PO TABS: 100.0000 mg | ORAL_TABLET | Freq: Every day | ORAL | 3 refills | Status: DC

## 2020-07-12 NOTE — Progress Notes (Signed)
Subjective:    Patient ID: [name], female    DOB: 04-23-1933, 87 y.o.   MRN: [id number]  HPI The patient is here for follow up of their chronic medical problems, including htn, afib, CAD, hyperlipidemia, prediabetes, anxiety, insomnia, h/o PUD, B12 def  Cough x 3 weeks - it started after her sinus infection.  She does have a chronic cough, but this is an acute cough that is worse.  She took doxy and the sinus infection got better.  The cough has persisted.  The cough is dry, it occurs throughout the day.   She denies wheezing.  A little sob yesterday, which may be from fluid retention.   She thinks she went into atrial fibrillation a couple of weeks ago.  She started gaining weight-she weighs herself daily.  She was getting 1 pound a day.  She has never had palpitations with her atrial fibrillation-just feeling of not being able to walk and weight gain.  She increased her Lasix to 20 mg daily and has done that for almost 2 weeks.  She thinks her weight is starting to go down.  She does feel better.  Yesterday she did feel a little short of breath when she was walking.  She states she has been consuming more sodium-there has been multiple parties.  Medications and allergies reviewed with patient and updated if appropriate.  Patient Active Problem List   Diagnosis Date Noted  . Aortic atherosclerosis ([place]) 01/06/2020  . Cough 12/22/2019  . Palpitations 12/07/2019  . Atrial fibrillation ([place]) 12/07/2019  . PUD (peptic ulcer disease) 12/07/2019  . Dyspnea on exertion 12/07/2019  . Eczema 07/27/2019  . CAD (coronary artery disease) 06/23/2019  . Epistaxis 04/19/2019  . NSTEMI (non-ST elevated myocardial infarction) ([place])   . Iron deficiency 12/26/2018  . Anemia 12/26/2018  . B12 deficiency 12/23/2018  . Discoloration of skin of finger 11/11/2018  . Chronic gastric ulcer without hemorrhage and without perforation 06/05/2018  . Constipation, chronic 06/05/2018  . Callus  of foot 03/04/2018  . Hammertoes of both feet 03/04/2018  . History of peptic ulcer disease 12/21/2017  . Hypertension 12/21/2017  . Syncope 12/14/2017  . Macrocytic anemia 12/14/2017  . Near syncope 12/14/2017  . Left sided numbness 10/19/2017  . Small vessel disease, cerebrovascular 10/19/2017  . Numbness and tingling of left arm and leg 10/13/2017  . Anxiety 10/02/2017  . Macular pucker, right eye 07/24/2017  . Pseudophakia of both eyes 07/24/2017  . Prediabetes 09/29/2016  . Malignant neoplasm of upper lobe of right lung ([place]) 09/29/2016  . Leg cramps 01/29/2016  . Insomnia 09/12/2015  . Rectal bleed 09/12/2015  . S/P AVR 11/16/2014  . Branch retinal vein occlusion 10/12/2013  . Iris nevus, right 10/12/2013  . Aortic valve stenosis, severe   . Allergic rhinitis 09/10/2009  . Hyperlipidemia 11/30/2006  . Mitral valve disease 10/29/2006  . Osteopenia 10/29/2006  . History of right breast cancer 10/29/2006    Current Outpatient Medications on File Prior to Visit  Medication Sig Dispense Refill  . acetaminophen (TYLENOL) 500 MG tablet Take 500-1,000 mg by mouth every 6 (six) hours as needed for mild pain or moderate pain.     Marland Kitchen ALPRAZolam (XANAX) 0.5 MG tablet TAKE 1 TABLET ONCE DAILY AS NEEDED FOR AN ANXIETY, DO NOT TAKE AT NIGHT. 90 tablet 0  . amiodarone (PACERONE) 100 MG tablet Take 1 tablet (100 mg total) by mouth daily. (Patient taking differently: Take 100 mg by mouth daily.  Patient takes 1/2 tablet daily) 90 tablet 3  . apixaban (ELIQUIS) 5 MG TABS tablet Take 1 tablet (5 mg total) by mouth 2 (two) times daily. 60 tablet 6  . Calcium Carb-Cholecalciferol 600-400 MG-UNIT TABS Take 1 tablet by mouth daily.    . Chloral Hydrate CRYS Take 10 mLs by mouth at bedtime as needed (Call  liquid into Media). 900 g 0  . chlorpheniramine-HYDROcodone (TUSSIONEX PENNKINETIC ER) 10-8 MG/5ML SUER Take 5 mLs by mouth at bedtime as needed for cough. 115 mL 0  .  Cholecalciferol (VITAMIN D-3) 1000 UNITS CAPS Take 1,000 Units by mouth daily.     . clindamycin (CLEOCIN) 300 MG capsule Take 600 mg by mouth See admin instructions. Take 600 mg 1 hour prior to dental work    . ferrous sulfate 325 (65 FE) MG tablet Take 325 mg by mouth every Monday, Wednesday, and Friday.    . fluticasone (FLONASE) 50 MCG/ACT nasal spray Place 2 sprays into both nostrils daily as needed for allergies.    . furosemide (LASIX) 20 MG tablet Take 0.5 tablets (10 mg total) by mouth daily.    Marland Kitchen levocetirizine (XYZAL) 5 MG tablet Take 5 mg by mouth at bedtime as needed for allergies.     . montelukast (SINGULAIR) 10 MG tablet TAKE 1 TABLET EACH DAY. 90 tablet 1  . nitroGLYCERIN (NITROSTAT) 0.4 MG SL tablet Place 1 tablet (0.4 mg total) under the tongue every 5 (five) minutes as needed for chest pain (CP or SOB). 25 tablet 3  . Ophthalmic Irrigation Solution (OCUSOFT EYE [place] OP) Apply 1 application to eye at bedtime.    . pantoprazole (PROTONIX) 40 MG tablet TAKE 1 TABLET BY MOUTH TWICE DAILY. 180 tablet 0  . polyethylene glycol (MIRALAX / GLYCOLAX) 17 g packet Take 17 g by mouth every other day.    . potassium chloride (KLOR-CON) 10 MEQ tablet Take 1 tablet (10 mEq total) by mouth daily. 90 tablet 3  . rosuvastatin (CRESTOR) 20 MG tablet Take 1 tablet (20 mg total) by mouth daily. 90 tablet 3  . tretinoin (RETIN-A) 0.1 % cream Apply 1 application topically every Monday, Wednesday, and Friday.     . Wheat Dextrin (BENEFIBER DRINK MIX PO) Take 1 Dose by mouth every other day.      No current facility-administered medications on file prior to visit.    Past Medical History:  Diagnosis Date  . Allergy   . Anemia   . Anxiety   . Aortic valve stenosis, severe    s/p AVR // mild to mod paravalvular leakage // Echo 9/21: EF 55-60, no RWMA, mild LVH, GR 2 DD, mildly reduced RVSF, RVSP 38.7, moderate LAE, mild RAE, AVR with mean gradient 11 mmHg, mild paravalvular leakage  . Breast cancer  ([place]) 2007   right  . Cataract   . Constipation   . Dysrhythmia    A-fib  . Hiatal hernia    pt denies  . Hypercholesteremia   . Hypertension   . Hypoglycemia   . Osteopenia   . Personal history of radiation therapy   . Tingling   . Ulcer 2012   bleeding gastric Ulcer    Past Surgical History:  Procedure Laterality Date  . AORTIC VALVE REPLACEMENT N/A 11/16/2014   Procedure: AORTIC VALVE REPLACEMENT (AVR);  Surgeon: Gaye Pollack, MD;  Location: [place];  Service: Open Heart Surgery;  Laterality: N/A;  . BIOPSY  12/15/2017   Procedure: BIOPSY;  Surgeon: Irving Copas., MD;  Location: [place];  Service: Gastroenterology;;  . BREAST BIOPSY    . BREAST LUMPECTOMY Right    2005?  Marland Kitchen BREAST SURGERY    . BRONCHIAL BIOPSY  03/06/2020   Procedure: BRONCHIAL BIOPSIES;  Surgeon: Collene Gobble, MD;  Location: [facility] ENDOSCOPY;  Service: Pulmonary;;  . BRONCHIAL BRUSHINGS  03/06/2020   Procedure: BRONCHIAL BRUSHINGS;  Surgeon: Collene Gobble, MD;  Location: [facility] ENDOSCOPY;  Service: Pulmonary;;  . BRONCHIAL NEEDLE ASPIRATION BIOPSY  03/06/2020   Procedure: BRONCHIAL NEEDLE ASPIRATION BIOPSIES;  Surgeon: Collene Gobble, MD;  Location: [facility] ENDOSCOPY;  Service: Pulmonary;;  . BRONCHIAL WASHINGS  03/06/2020   Procedure: BRONCHIAL WASHINGS;  Surgeon: Collene Gobble, MD;  Location: [facility] ENDOSCOPY;  Service: Pulmonary;;  . CARDIAC CATHETERIZATION N/A 10/18/2014   Procedure: Right/Left Heart Cath and Coronary Angiography;  Surgeon: [name], MD;  Location: [place] CV LAB;  Service: Cardiovascular;  Laterality: N/A;  . CARDIOVERSION N/A 12/09/2019   Procedure: CARDIOVERSION;  Surgeon: Josue Hector, MD;  Location: [facility] ENDOSCOPY;  Service: Cardiovascular;  Laterality: N/A;  . CARDIOVERSION N/A 12/30/2019   Procedure: CARDIOVERSION;  Surgeon: Josue Hector, MD;  Location: [facility] ENDOSCOPY;  Service: Cardiovascular;  Laterality: N/A;  . CORONARY ANGIOGRAPHY N/A 02/04/2019   Procedure:  CORONARY ANGIOGRAPHY;  Surgeon: Nelva Bush, MD;  Location: [place] CV LAB;  Service: Cardiovascular;  Laterality: N/A;  . COSMETIC SURGERY     face  . ESOPHAGOGASTRODUODENOSCOPY (EGD) WITH PROPOFOL N/A 12/15/2017   Procedure: ESOPHAGOGASTRODUODENOSCOPY (EGD) WITH PROPOFOL;  Surgeon: Rush Landmark Telford Nab., MD;  Location: [place];  Service: Gastroenterology;  Laterality: N/A;  . FIDUCIAL MARKER PLACEMENT  03/06/2020   Procedure: FIDUCIAL MARKER PLACEMENT;  Surgeon: Collene Gobble, MD;  Location: [facility] ENDOSCOPY;  Service: Pulmonary;;  . PARATHYROIDECTOMY    . TEE WITHOUT CARDIOVERSION N/A 11/16/2014   Procedure: TRANSESOPHAGEAL ECHOCARDIOGRAM (TEE);  Surgeon: Gaye Pollack, MD;  Location: [place];  Service: Open Heart Surgery;  Laterality: N/A;  . TEE WITHOUT CARDIOVERSION N/A 12/09/2019   Procedure: TRANSESOPHAGEAL ECHOCARDIOGRAM (TEE);  Surgeon: Josue Hector, MD;  Location: [facility] ENDOSCOPY;  Service: Cardiovascular;  Laterality: N/A;  . TONSILLECTOMY    . VIDEO BRONCHOSCOPY WITH ENDOBRONCHIAL NAVIGATION Right 03/06/2020   Procedure: VIDEO BRONCHOSCOPY WITH ENDOBRONCHIAL NAVIGATION;  Surgeon: Collene Gobble, MD;  Location: [facility] ENDOSCOPY;  Service: Pulmonary;  Laterality: Right;    Social History   Socioeconomic History  . Marital status: Unknown    Spouse name: Not on file  . Number of children: 3  . Years of education: 16  . Highest education level: Bachelor's degree (e.g., BA, AB, BS)  Occupational History  . Occupation: Retired  Tobacco Use  . Smoking status: Former Smoker    Packs/day: 0.50    Years: 15.00    Pack years: 7.50    Types: Cigarettes    Start date: 89    Quit date: 1986    Years since quitting: 36.2  . Smokeless tobacco: Never Used  . Tobacco comment: quit smoking 1980  Vaping Use  . Vaping Use: Never used  Substance and Sexual Activity  . Alcohol use: Yes    Alcohol/week: 7.0 standard drinks    Types: 7 Glasses of wine per week    Comment: wine  nightly with dinner  . Drug use: No  . Sexual activity: Not Currently  Other Topics Concern  . Not on file  Social History Narrative   ** Merged  History Encounter **       Lives at [facility] with her husband. Right-handed. Caffeine use: 3-4 cups per day (some tea, mixes coffee with decaf).   Social Determinants of Health   Financial Resource Strain: Not on file  Food Insecurity: Not on file  Transportation Needs: Not on file  Physical Activity: Not on file  Stress: Not on file  Social Connections: Not on file    Family History  Problem Relation Age of Onset  . Hypertension Mother   . Rectal cancer Mother   . Colon cancer Mother   . Kidney disease Father   . Cancer Cousin 63       breast and ovarian   . Esophageal cancer Neg Hx   . Stomach cancer Neg Hx   . Colon polyps Neg Hx     Review of Systems  Constitutional: Negative for chills and fever.  HENT: Positive for congestion (chronic). Negative for postnasal drip.   Respiratory: Positive for cough and shortness of breath (a little yesterday with walking). Negative for wheezing.   Cardiovascular: Positive for leg swelling (yesterday). Negative for chest pain and palpitations.  Gastrointestinal: Negative for nausea.       No gerd  Neurological: Positive for light-headedness (a little sometimes). Negative for headaches.       Objective:   Vitals:   07/13/20 1104  BP: 114/72  Pulse: (!) 55  Temp: 97.9 F (36.6 C)  SpO2: 96%   BP Readings from Last 3 Encounters:  07/13/20 114/72  04/03/20 (!) 151/52  03/14/20 116/72   Wt Readings from Last 3 Encounters:  07/13/20 160 lb (72.6 kg)  04/03/20 158 lb 6.4 oz (71.8 kg)  03/30/20 153 lb (69.4 kg)   Body mass index is 26.63 kg/m.   Physical Exam    Constitutional: Appears well-developed and well-nourished. No distress.  HENT:  Head: Normocephalic and atraumatic.  Neck: Neck supple. No tracheal deviation present. No thyromegaly present.  No cervical  lymphadenopathy Cardiovascular: Normal rate, regular rhythm and normal heart sounds.   2/6 sys murmur more prominent at RSB. No carotid bruit .  No edema Pulmonary/Chest: Effort normal and breath sounds normal. No respiratory distress. No has no wheezes. No rales.  Skin: Skin is warm and dry. Not diaphoretic.  Psychiatric: Normal mood and affect. Behavior is normal.      Assessment & Plan:    See Problem List for Assessment and Plan of chronic medical problems.    This visit occurred during the SARS-CoV-2 public health emergency.  Safety protocols were in place, including screening questions prior to the visit, additional usage of staff PPE, and extensive cleaning of exam room while observing appropriate contact time as indicated for disinfecting solutions.

## 2020-07-12 NOTE — Patient Instructions (Addendum)
    Blood work was ordered.      Medications changes include :   none     Please followup in 6 months  

## 2020-07-13 ENCOUNTER — Other Ambulatory Visit: Payer: Self-pay

## 2020-07-13 ENCOUNTER — Ambulatory Visit (INDEPENDENT_AMBULATORY_CARE_PROVIDER_SITE_OTHER): Payer: Medicare Other | Admitting: Internal Medicine

## 2020-07-13 ENCOUNTER — Encounter: Payer: Self-pay | Admitting: Internal Medicine

## 2020-07-13 VITALS — BP 114/72 | HR 55 | Temp 97.9°F | Ht 65.0 in | Wt 160.0 lb

## 2020-07-13 DIAGNOSIS — E538 Deficiency of other specified B group vitamins: Secondary | ICD-10-CM

## 2020-07-13 DIAGNOSIS — F5101 Primary insomnia: Secondary | ICD-10-CM | POA: Diagnosis not present

## 2020-07-13 DIAGNOSIS — I5033 Acute on chronic diastolic (congestive) heart failure: Secondary | ICD-10-CM | POA: Insufficient documentation

## 2020-07-13 DIAGNOSIS — I4891 Unspecified atrial fibrillation: Secondary | ICD-10-CM

## 2020-07-13 DIAGNOSIS — I2510 Atherosclerotic heart disease of native coronary artery without angina pectoris: Secondary | ICD-10-CM

## 2020-07-13 DIAGNOSIS — R7303 Prediabetes: Secondary | ICD-10-CM

## 2020-07-13 DIAGNOSIS — R059 Cough, unspecified: Secondary | ICD-10-CM

## 2020-07-13 DIAGNOSIS — Z8711 Personal history of peptic ulcer disease: Secondary | ICD-10-CM | POA: Diagnosis not present

## 2020-07-13 DIAGNOSIS — E782 Mixed hyperlipidemia: Secondary | ICD-10-CM

## 2020-07-13 DIAGNOSIS — I10 Essential (primary) hypertension: Secondary | ICD-10-CM | POA: Diagnosis not present

## 2020-07-13 DIAGNOSIS — F419 Anxiety disorder, unspecified: Secondary | ICD-10-CM | POA: Diagnosis not present

## 2020-07-13 LAB — CBC WITH DIFFERENTIAL/PLATELET
Basophils Absolute: 0.1 10*3/uL (ref 0.0–0.1)
Basophils Relative: 0.8 % (ref 0.0–3.0)
Eosinophils Absolute: 0 10*3/uL (ref 0.0–0.7)
Eosinophils Relative: 0.5 % (ref 0.0–5.0)
HCT: 39 % (ref 36.0–46.0)
Hemoglobin: 13.1 g/dL (ref 12.0–15.0)
Lymphocytes Relative: 31 % (ref 12.0–46.0)
Lymphs Abs: 2 10*3/uL (ref 0.7–4.0)
MCHC: 33.6 g/dL (ref 30.0–36.0)
MCV: 95.9 fl (ref 78.0–100.0)
Monocytes Absolute: 0.5 10*3/uL (ref 0.1–1.0)
Monocytes Relative: 8.2 % (ref 3.0–12.0)
Neutro Abs: 3.9 10*3/uL (ref 1.4–7.7)
Neutrophils Relative %: 59.5 % (ref 43.0–77.0)
Platelets: 147 10*3/uL — ABNORMAL LOW (ref 150.0–400.0)
RBC: 4.07 Mil/uL (ref 3.87–5.11)
RDW: 13.3 % (ref 11.5–15.5)
WBC: 6.5 10*3/uL (ref 4.0–10.5)

## 2020-07-13 LAB — COMPREHENSIVE METABOLIC PANEL
ALT: 19 U/L (ref 0–35)
AST: 29 U/L (ref 0–37)
Albumin: 3.9 g/dL (ref 3.5–5.2)
Alkaline Phosphatase: 95 U/L (ref 39–117)
BUN: 16 mg/dL (ref 6–23)
CO2: 28 mEq/L (ref 19–32)
Calcium: 9 mg/dL (ref 8.4–10.5)
Chloride: 101 mEq/L (ref 96–112)
Creatinine, Ser: 0.87 mg/dL (ref 0.40–1.20)
GFR: 60.76 mL/min (ref >60.00)
Glucose, Bld: 90 mg/dL (ref 70–99)
Potassium: 4.5 mEq/L (ref 3.5–5.1)
Sodium: 136 mEq/L (ref 135–145)
Total Bilirubin: 0.5 mg/dL (ref 0.2–1.2)
Total Protein: 6.2 g/dL (ref 6.0–8.3)

## 2020-07-13 LAB — TSH: TSH: 2.19 u[IU]/mL (ref 0.35–4.50)

## 2020-07-13 LAB — LIPID PANEL
Cholesterol: 177 mg/dL (ref 0–200)
HDL: 83.5 mg/dL (ref >39.00)
LDL Cholesterol: 77 mg/dL (ref 0–99)
NonHDL: 93.81
Total CHOL/HDL Ratio: 2
Triglycerides: 82 mg/dL (ref 0.0–149.0)
VLDL: 16.4 mg/dL (ref 0.0–40.0)

## 2020-07-13 LAB — BRAIN NATRIURETIC PEPTIDE: Pro B Natriuretic peptide (BNP): 196 pg/mL — ABNORMAL HIGH (ref 0.0–100.0)

## 2020-07-13 LAB — T4, FREE: Free T4: 0.98 ng/dL (ref 0.60–1.60)

## 2020-07-13 LAB — HEMOGLOBIN A1C: Hgb A1c MFr Bld: 5.9 % (ref 4.6–6.5)

## 2020-07-13 NOTE — Assessment & Plan Note (Signed)
Chronic Controlled Continue Xanax 0.5 mg daily as needed

## 2020-07-13 NOTE — Assessment & Plan Note (Signed)
Chronic Denies any reflux Continue pantoprazole 40 mg twice daily

## 2020-07-13 NOTE — Assessment & Plan Note (Signed)
Chronic Denies chest pain.  Some shortness of breath which seems more related to diastolic heart failure Continue Lasix 20 mg daily Continue Crestor

## 2020-07-13 NOTE — Assessment & Plan Note (Signed)
Chronic BP well controlled Continue Lasix 20 mg daily for now-we will discuss dose with cardiology cmp

## 2020-07-13 NOTE — Assessment & Plan Note (Signed)
Acute About 2 weeks ago she noticed that she was gaining weight about 1 pound a week and thought this could have been related to atrial fibrillation She did have a little bit of swelling in her ankles and some shortness of breath She increase Lasix to 20 mg daily and think she is feeling better-advised her to continue 20 mg for now until she sees cardiology next week We will check BNP today along with her routine blood work Stressed limiting her sodium intake Continue daily weights

## 2020-07-13 NOTE — Assessment & Plan Note (Signed)
Chronic Controlled Continue chloral hydrate 10 mL at bedtime

## 2020-07-13 NOTE — Assessment & Plan Note (Signed)
Chronic Check lipid panel  Continue Crestor 20 mg daily Regular exercise and healthy diet encouraged

## 2020-07-13 NOTE — Assessment & Plan Note (Signed)
Acute on chronic Discussed this could be combination of postinfection cough from her sinus infection and somewhat related to acute on chronic diastolic heart failure Her weight is going down and she will continue Lasix 20 mg daily until she sees cardiology next week We will check BNP Discussed symptomatic treatment for the cough-cough syrup and cough suppressants.  Discussed possible inhalers.  We both agreed to avoid oral steroids since the cough is tolerable I would expect the cough to improve slowly and get back to her baseline cough

## 2020-07-13 NOTE — Assessment & Plan Note (Signed)
Chronic Check a1c Low sugar / carb diet Stressed regular exercise  

## 2020-07-13 NOTE — Assessment & Plan Note (Signed)
Currently in sinus rhythm She thinks she may have gone into A. fib recently, but this could have just been acute on chronic diastolic heart failure On amiodarone and Eliquis TSH, free T4 for cardiology CBC, CMP

## 2020-07-17 ENCOUNTER — Telehealth: Payer: Self-pay | Admitting: Cardiovascular Disease

## 2020-07-17 ENCOUNTER — Other Ambulatory Visit: Payer: Self-pay

## 2020-07-17 ENCOUNTER — Other Ambulatory Visit: Payer: Medicare Other | Admitting: *Deleted

## 2020-07-17 ENCOUNTER — Ambulatory Visit (HOSPITAL_COMMUNITY): Payer: Medicare Other | Attending: Cardiovascular Disease

## 2020-07-17 DIAGNOSIS — I480 Paroxysmal atrial fibrillation: Secondary | ICD-10-CM | POA: Insufficient documentation

## 2020-07-17 DIAGNOSIS — I5032 Chronic diastolic (congestive) heart failure: Secondary | ICD-10-CM | POA: Diagnosis not present

## 2020-07-17 DIAGNOSIS — D485 Neoplasm of uncertain behavior of skin: Secondary | ICD-10-CM | POA: Diagnosis not present

## 2020-07-17 DIAGNOSIS — C44722 Squamous cell carcinoma of skin of right lower limb, including hip: Secondary | ICD-10-CM | POA: Diagnosis not present

## 2020-07-17 LAB — ECHOCARDIOGRAM COMPLETE
AV Mean grad: 13.2 mmHg
AV Peak grad: 23.6 mmHg
Ao pk vel: 2.43 m/s
P 1/2 time: 770 msec
S' Lateral: 2.9 cm

## 2020-07-17 NOTE — Telephone Encounter (Signed)
[name] is calling stating she is returning a call from our office. I was unable to find documentation for the reason behind the call and advised it was most likely an appointment reminder for her appointment on Thursday. Patient requested a message be sent to confirm all the blood work needed for her appointment was taken today. She states Dr. Quay Burow added orders that she was wanting. Please advise.

## 2020-07-19 ENCOUNTER — Ambulatory Visit (INDEPENDENT_AMBULATORY_CARE_PROVIDER_SITE_OTHER): Payer: Medicare Other | Admitting: Cardiovascular Disease

## 2020-07-19 ENCOUNTER — Other Ambulatory Visit: Payer: Self-pay

## 2020-07-19 ENCOUNTER — Encounter: Payer: Self-pay | Admitting: Cardiovascular Disease

## 2020-07-19 VITALS — BP 124/70 | HR 58 | Ht 65.0 in | Wt 159.0 lb

## 2020-07-19 DIAGNOSIS — I5033 Acute on chronic diastolic (congestive) heart failure: Secondary | ICD-10-CM

## 2020-07-19 DIAGNOSIS — I480 Paroxysmal atrial fibrillation: Secondary | ICD-10-CM | POA: Diagnosis not present

## 2020-07-19 DIAGNOSIS — I2510 Atherosclerotic heart disease of native coronary artery without angina pectoris: Secondary | ICD-10-CM

## 2020-07-19 DIAGNOSIS — Z952 Presence of prosthetic heart valve: Secondary | ICD-10-CM | POA: Diagnosis not present

## 2020-07-19 MED ORDER — FUROSEMIDE 20 MG PO TABS
20.0000 mg | ORAL_TABLET | Freq: Every day | ORAL | 3 refills | Status: DC
Start: 2020-07-19 — End: 2020-10-11

## 2020-07-19 NOTE — Progress Notes (Signed)
Cardiology Office Note:    Date:  07/19/2020   ID:  [name], DOB 1932-08-15, MRN [id number]  PCP:  [name], MD   [place]  Cardiologist:  [name], MD  Advanced Practice Provider:  No care team member to display Electrophysiologist:  None       Referring MD: [name], MD   Chief Complaint  Patient presents with  . Shortness of Breath    History of Present Illness:    [name] is a 87 y.o. female with a hx of:  Persistent atrial fibrillation  ? S/p TEE-DCCV 11/2019 ? CHA2DS2-VASc=6 (age x 2, female, CAD, CHF, HTN) >> Apixaban  Bicuspid valve, aortic stenosis ? S/p bioprosthetic AVR in 2016 ? Paravalvular leak ? Normal coronary arteries prior to AVR  Coronary artery disease ? S/p NSTEMI in 01/2019 >> total occlusion of distal LAD felt to be prob embolic event ? Ischemic CM, EF 45-50 w ant-apical AK ? TEE 8/21: EF 55  Breast CA  Hypertension   Hyperlipidemia   Diastolic CHF  The patient is here alone today.  Her daughter, [name], is conferenced in over the telephone.  Since I have seen her last, she was diagnosed with stage I adenocarcinoma of the right lung and she is being treated with targeted radiotherapy. She began having more shortness of breath and leg swelling last week. She increased her furosemide from 10 mg once daily to 20 mg twice daily.  She is feeling much better.  Her leg heaviness fully resolved.  She denies chest pain, chest pressure, orthopnea, or PND.  No recent heart palpitations.  Past Medical History:  Diagnosis Date  . Allergy   . Anemia   . Anxiety   . Aortic valve stenosis, severe    s/p AVR // mild to mod paravalvular leakage // Echo 9/21: EF 55-60, no RWMA, mild LVH, GR 2 DD, mildly reduced RVSF, RVSP 38.7, moderate LAE, mild RAE, AVR with mean gradient 11 mmHg, mild paravalvular leakage  . Breast cancer ([place]) 2007   right  . Cataract   . Constipation   .  Dysrhythmia    A-fib  . Hiatal hernia    pt denies  . Hypercholesteremia   . Hypertension   . Hypoglycemia   . Osteopenia   . Personal history of radiation therapy   . Tingling   . Ulcer 2012   bleeding gastric Ulcer    Past Surgical History:  Procedure Laterality Date  . AORTIC VALVE REPLACEMENT N/A 11/16/2014   Procedure: AORTIC VALVE REPLACEMENT (AVR);  Surgeon: Gaye Pollack, MD;  Location: [place];  Service: Open Heart Surgery;  Laterality: N/A;  . BIOPSY  12/15/2017   Procedure: BIOPSY;  Surgeon: Rush Landmark Telford Nab., MD;  Location: [place];  Service: Gastroenterology;;  . BREAST BIOPSY    . BREAST LUMPECTOMY Right    2005?  Marland Kitchen BREAST SURGERY    . BRONCHIAL BIOPSY  03/06/2020   Procedure: BRONCHIAL BIOPSIES;  Surgeon: Collene Gobble, MD;  Location[facility] ENDOSCOPY;  Service: Pulmonary;;  . BRONCHIAL BRUSHINGS  03/06/2020   Procedure: BRONCHIAL BRUSHINGS;  Surgeon: Collene Gobble, MD;  Location: [facility] ENDOSCOPY;  Service: Pulmonary;;  . BRONCHIAL NEEDLE ASPIRATION BIOPSY  03/06/2020   Procedure: BRONCHIAL NEEDLE ASPIRATION BIOPSIES;  Surgeon: Collene Gobble, MD;  Location: [place];  Service: Pulmonary;;  . BRONCHIAL WASHINGS  03/06/2020   Procedure: BRONCHIAL WASHINGS;  Surgeon: Collene Gobble, MD;  Location: [place] ENDOSCOPY;  Service: Pulmonary;;  . CARDIAC CATHETERIZATION N/A 10/18/2014   Procedure: Right/Left Heart Cath and Coronary Angiography;  Surgeon: [name], MD;  Location: [place] CV LAB;  Service: Cardiovascular;  Laterality: N/A;  . CARDIOVERSION N/A 12/09/2019   Procedure: CARDIOVERSION;  Surgeon: Josue Hector, MD;  Location: [facility] ENDOSCOPY;  Service: Cardiovascular;  Laterality: N/A;  . CARDIOVERSION N/A 12/30/2019   Procedure: CARDIOVERSION;  Surgeon: Josue Hector, MD;  Location: [facility] ENDOSCOPY;  Service: Cardiovascular;  Laterality: N/A;  . CORONARY ANGIOGRAPHY N/A 02/04/2019   Procedure: CORONARY ANGIOGRAPHY;  Surgeon: Nelva Bush, MD;   Location: [place] CV LAB;  Service: Cardiovascular;  Laterality: N/A;  . COSMETIC SURGERY     face  . ESOPHAGOGASTRODUODENOSCOPY (EGD) WITH PROPOFOL N/A 12/15/2017   Procedure: ESOPHAGOGASTRODUODENOSCOPY (EGD) WITH PROPOFOL;  Surgeon: Rush Landmark Telford Nab., MD;  Location: [place];  Service: Gastroenterology;  Laterality: N/A;  . FIDUCIAL MARKER PLACEMENT  03/06/2020   Procedure: FIDUCIAL MARKER PLACEMENT;  Surgeon: Collene Gobble, MD;  Location: [facility] ENDOSCOPY;  Service: Pulmonary;;  . PARATHYROIDECTOMY    . TEE WITHOUT CARDIOVERSION N/A 11/16/2014   Procedure: TRANSESOPHAGEAL ECHOCARDIOGRAM (TEE);  Surgeon: Gaye Pollack, MD;  Location: [place];  Service: Open Heart Surgery;  Laterality: N/A;  . TEE WITHOUT CARDIOVERSION N/A 12/09/2019   Procedure: TRANSESOPHAGEAL ECHOCARDIOGRAM (TEE);  Surgeon: Josue Hector, MD;  Location: [facility] ENDOSCOPY;  Service: Cardiovascular;  Laterality: N/A;  . TONSILLECTOMY    . VIDEO BRONCHOSCOPY WITH ENDOBRONCHIAL NAVIGATION Right 03/06/2020   Procedure: VIDEO BRONCHOSCOPY WITH ENDOBRONCHIAL NAVIGATION;  Surgeon: Collene Gobble, MD;  Location: [facility] ENDOSCOPY;  Service: Pulmonary;  Laterality: Right;    Current Medications: Current Meds  Medication Sig  . acetaminophen (TYLENOL) 500 MG tablet Take 500-1,000 mg by mouth every 6 (six) hours as needed for mild pain or moderate pain.   Marland Kitchen ALPRAZolam (XANAX) 0.5 MG tablet TAKE 1 TABLET ONCE DAILY AS NEEDED FOR AN ANXIETY, DO NOT TAKE AT NIGHT.  Marland Kitchen amiodarone (PACERONE) 100 MG tablet Take 100 mg by mouth daily.  Marland Kitchen apixaban (ELIQUIS) 5 MG TABS tablet Take 1 tablet (5 mg total) by mouth 2 (two) times daily.  . Calcium Carb-Cholecalciferol 600-400 MG-UNIT TABS Take 1 tablet by mouth daily.  . Chloral Hydrate CRYS Take 10 mLs by mouth at bedtime as needed (Call  liquid into [place]).  . chlorpheniramine-HYDROcodone (TUSSIONEX PENNKINETIC ER) 10-8 MG/5ML SUER Take 5 mLs by mouth at bedtime as needed  for cough.  . Cholecalciferol (VITAMIN D-3) 1000 UNITS CAPS Take 1,000 Units by mouth daily.   . clindamycin (CLEOCIN) 300 MG capsule Take 600 mg by mouth See admin instructions. Take 600 mg 1 hour prior to dental work  . ferrous sulfate 325 (65 FE) MG tablet Take 325 mg by mouth every Monday, Wednesday, and Friday.  . fluticasone (FLONASE) 50 MCG/ACT nasal spray Place 2 sprays into both nostrils daily as needed for allergies.  . furosemide (LASIX) 20 MG tablet Take 0.5 tablets (10 mg total) by mouth daily.  Marland Kitchen levocetirizine (XYZAL) 5 MG tablet Take 5 mg by mouth at bedtime as needed for allergies.   . montelukast (SINGULAIR) 10 MG tablet TAKE 1 TABLET EACH DAY.  . nitroGLYCERIN (NITROSTAT) 0.4 MG SL tablet Place 1 tablet (0.4 mg total) under the tongue every 5 (five) minutes as needed for chest pain (CP or SOB).  Marland Kitchen Ophthalmic Irrigation Solution (OCUSOFT EYE [place] OP) Apply 1 application to eye at  bedtime.  . pantoprazole (PROTONIX) 40 MG tablet TAKE 1 TABLET BY MOUTH TWICE DAILY.  Marland Kitchen polyethylene glycol (MIRALAX / GLYCOLAX) 17 g packet Take 17 g by mouth every other day.  . potassium chloride (KLOR-CON) 10 MEQ tablet Take 1 tablet (10 mEq total) by mouth daily.  . rosuvastatin (CRESTOR) 20 MG tablet Take 1 tablet (20 mg total) by mouth daily.  Marland Kitchen tretinoin (RETIN-A) 0.1 % cream Apply 1 application topically every Monday, Wednesday, and Friday.   . Wheat Dextrin (BENEFIBER DRINK MIX PO) Take 1 Dose by mouth every other day.      Allergies:   Lactose intolerance (gi), Amoxicillin-pot clavulanate, Penicillins, Sulfa antibiotics, and Sulfonamide derivatives   Social History   Socioeconomic History  . Marital status: Unknown    Spouse name: Not on file  . Number of children: 3  . Years of education: 16  . Highest education level: Bachelor's degree (e.g., BA, AB, BS)  Occupational History  . Occupation: Retired  Tobacco Use  . Smoking status: Former Smoker    Packs/day: 0.50    Years: 15.00     Pack years: 7.50    Types: Cigarettes    Start date: 77    Quit date: 1986    Years since quitting: 36.2  . Smokeless tobacco: Never Used  . Tobacco comment: quit smoking 1980  Vaping Use  . Vaping Use: Never used  Substance and Sexual Activity  . Alcohol use: Yes    Alcohol/week: 7.0 standard drinks    Types: 7 Glasses of wine per week    Comment: wine nightly with dinner  . Drug use: No  . Sexual activity: Not Currently  Other Topics Concern  . Not on file  Social History Narrative   ** Merged History Encounter **       Lives at [facility] with her husband. Right-handed. Caffeine use: 3-4 cups per day (some tea, mixes coffee with decaf).   Social Determinants of Health   Financial Resource Strain: Not on file  Food Insecurity: Not on file  Transportation Needs: Not on file  Physical Activity: Not on file  Stress: Not on file  Social Connections: Not on file     Family History: The patient's family history includes Cancer (age of onset: 49) in her cousin; Colon cancer in her mother; Hypertension in her mother; Kidney disease in her father; Rectal cancer in her mother. There is no history of Esophageal cancer, Stomach cancer, or Colon polyps.  ROS:   Please see the history of present illness.    All other systems reviewed and are negative.  EKGs/Labs/Other Studies Reviewed:    The following studies were reviewed today: Echo 07/17/2020: FINDINGS  Left Ventricle: Left ventricular ejection fraction, by estimation, is 70  to 75%. The left ventricle has hyperdynamic function. The left ventricle  has no regional wall motion abnormalities. The left ventricular internal  cavity size was normal in size.  There is no left ventricular hypertrophy. Left ventricular diastolic  parameters are indeterminate.   Right Ventricle: The right ventricular size is normal. No increase in  right ventricular wall thickness. Right ventricular systolic function is  mildly reduced.  There is mildly elevated pulmonary artery systolic  pressure. The tricuspid regurgitant velocity  is 3.00 m/s, and with an assumed right atrial pressure of 3 mmHg, the  estimated right ventricular systolic pressure is 62.9 mmHg.   Left Atrium: Left atrial size was normal in size.   Right Atrium: Right atrial size  was moderately dilated.   Pericardium: There is no evidence of pericardial effusion.   Mitral Valve: The mitral valve is normal in structure. No evidence of  mitral valve regurgitation.   Tricuspid Valve: The tricuspid valve is grossly normal. Tricuspid valve  regurgitation is trivial.   Aortic Valve: The aortic valve has been repaired/replaced. Aortic valve  regurgitation is mild to moderate. Aortic regurgitation PHT measures 770  msec. Aortic valve mean gradient measures 13.2 mmHg. Aortic valve peak  gradient measures 23.6 mmHg. There is  a 21 mm Edwards bioprosthetic valve present in the aortic position.  Procedure Date: 11/16/14.   Pulmonic Valve: The pulmonic valve was grossly normal. Pulmonic valve  regurgitation is mild.   Aorta: The aortic root and ascending aorta are structurally normal, with  no evidence of dilitation.   IAS/Shunts: The atrial septum is grossly normal.   EKG:  EKG is ordered today.  The ekg ordered today demonstrates sinus bradycardia 58 bpm, nonspecific ST abnormality, baseline wander, otherwise within normal limits.  Recent Labs: 07/13/2020: ALT 19; BUN 16; Creatinine, Ser 0.87; Hemoglobin 13.1; Platelets 147.0; Potassium 4.5; Pro B Natriuretic peptide (BNP) 196.0; Sodium 136; TSH 2.19  Recent Lipid Panel    Component Value Date/Time   CHOL 177 07/13/2020 1158   TRIG 82.0 07/13/2020 1158   HDL 83.50 07/13/2020 1158   CHOLHDL 2 07/13/2020 1158   VLDL 16.4 07/13/2020 1158   LDLCALC 77 07/13/2020 1158   LDLDIRECT 144.0 11/29/2012 1601     Risk Assessment/Calculations:    CHA2DS2-VASc Score = 5  This indicates a 7.2% annual risk of  stroke. The patient's score is based upon: CHF History: No HTN History: Yes Diabetes History: No Stroke History: No Vascular Disease History: Yes Age Score: 2 Gender Score: 1      Physical Exam:    VS:  BP 124/70   Pulse (!) 58   Ht 5\' 5"  (1.651 m)   Wt 159 lb (72.1 kg)   SpO2 95%   BMI 26.46 kg/m     Wt Readings from Last 3 Encounters:  07/19/20 159 lb (72.1 kg)  07/13/20 160 lb (72.6 kg)  04/03/20 158 lb 6.4 oz (71.8 kg)     GEN:  Well nourished, well developed in no acute distress HEENT: Normal NECK: No JVD; No carotid bruits LYMPHATICS: No lymphadenopathy CARDIAC: RRR, 2/6 systolic murmur at the RUSB RESPIRATORY:  Clear to auscultation without rales, wheezing or rhonchi  ABDOMEN: Soft, non-tender, non-distended MUSCULOSKELETAL:  No edema; No deformity  SKIN: Warm and dry NEUROLOGIC:  Alert and oriented x 3 PSYCHIATRIC:  Normal affect   ASSESSMENT:    1. Paroxysmal atrial fibrillation (HCC)   2. S/P AVR (aortic valve replacement)   3. Coronary artery disease involving native coronary artery of native heart without angina pectoris   4. Acute on chronic diastolic CHF (congestive heart failure) ([place])    PLAN:    In order of problems listed above:  1. The patient is maintaining sinus rhythm on low-dose amiodarone 100 mg daily.  She just had amiodarone monitoring labs with normal liver function and thyroid function testing.  Overall seems to be doing very well.  Tolerating apixaban without bleeding problems.  No changes are made today.  Return for follow-up evaluation in 6 months with repeat LFTs and thyroid function studies. 2. The patient's recent echocardiogram is reviewed.  She continues to demonstrate findings of mild to moderate paravalvular regurgitation.  I cannot appreciate a diastolic murmur on her exam.  I think she is doing well overall.  LV function is well-preserved. 3. No anginal symptoms.  Continue apixaban. 4. The patient is now well compensated  after increasing her furosemide dose from 10 mg daily up to 20 mg twice daily.  I asked her to reduce her dose to 20 mg once daily (twice her previous maintenance dose).  She will increase to 20 mg twice daily if she experiences weight gain of 3 pounds or greater.  If she does not respond to this, she will notify me and we will evaluate        Medication Adjustments/Labs and Tests Ordered: Current medicines are reviewed at length with the patient today.  Concerns regarding medicines are outlined above.  No orders of the defined types were placed in this encounter.  No orders of the defined types were placed in this encounter.   There are no Patient Instructions on file for this visit.   Signed, [name], MD  07/19/2020 11:24 AM    [place]

## 2020-07-19 NOTE — Patient Instructions (Signed)
Medication Instructions:  1) DECREASE LASIX to 20 mg once daily. You may take an extra 20 mg once daily AS NEEDED for weight gain of 3 pounds. *If you need a refill on your cardiac medications before your next appointment, please call your pharmacy*  Lab Work: Your provider recommends that you return for lab work in [place] prior to your visit with Dr. Burt Knack.  Follow-Up: At [facility], you and your health needs are our priority.  As part of our continuing mission to provide you with exceptional heart care, we have created designated Provider Care Teams.  These Care Teams include your primary Cardiologist (physician) and Advanced Practice Providers (APPs -  Physician Assistants and Nurse Practitioners) who all work together to provide you with the care you need, when you need it. Your next appointment:   6 month(s) The format for your next appointment:   In Person Provider:   You may see [name], MD or one of the following Advanced Practice Providers on your designated Care Team:    Richardson Dopp, PA-C  [place], [place]

## 2020-07-26 ENCOUNTER — Encounter: Payer: Self-pay | Admitting: Internal Medicine

## 2020-07-28 ENCOUNTER — Other Ambulatory Visit: Payer: Self-pay | Admitting: Gastroenterology

## 2020-07-28 DIAGNOSIS — Z23 Encounter for immunization: Secondary | ICD-10-CM | POA: Diagnosis not present

## 2020-07-30 ENCOUNTER — Encounter: Payer: Self-pay | Admitting: Internal Medicine

## 2020-07-30 ENCOUNTER — Other Ambulatory Visit: Payer: Self-pay | Admitting: Internal Medicine

## 2020-07-30 MED ORDER — HYDROCODONE-HOMATROPINE 5-1.5 MG/5ML PO SYRP: 5.0000 mL | ORAL_SOLUTION | Freq: Four times a day (QID) | ORAL | 0 refills | Status: DC | PRN

## 2020-07-30 MED ORDER — HYDROCOD POLST-CPM POLST ER 10-8 MG/5ML PO SUER: 5.0000 mL | Freq: Every evening | ORAL | 0 refills | Status: DC | PRN

## 2020-08-02 ENCOUNTER — Telehealth: Payer: Self-pay | Admitting: *Deleted

## 2020-08-02 NOTE — Telephone Encounter (Signed)
Spoke with the patient to discuss her upcoming appointments.  I informed her that per her last conversation with the PA she is due for a scan in May/June.  I let her know that [name] would give her a call to set up her scan as well as her follow-up with the PA.  She verbalized understanding.   [name]. [name], BSN

## 2020-08-09 DIAGNOSIS — J309 Allergic rhinitis, unspecified: Secondary | ICD-10-CM | POA: Diagnosis not present

## 2020-08-10 ENCOUNTER — Encounter: Payer: Self-pay | Admitting: Internal Medicine

## 2020-08-12 ENCOUNTER — Other Ambulatory Visit: Payer: Self-pay | Admitting: Internal Medicine

## 2020-08-13 DIAGNOSIS — Z85828 Personal history of other malignant neoplasm of skin: Secondary | ICD-10-CM | POA: Diagnosis not present

## 2020-08-13 MED ORDER — ALPRAZOLAM 0.5 MG PO TABS: ORAL_TABLET | ORAL | 0 refills | Status: DC

## 2020-08-17 ENCOUNTER — Other Ambulatory Visit: Payer: Self-pay | Admitting: Internal Medicine

## 2020-09-07 ENCOUNTER — Telehealth: Payer: Self-pay | Admitting: Internal Medicine

## 2020-09-07 NOTE — Progress Notes (Signed)
  Chronic Care Management   Outreach Note  09/07/2020 Name: [name] MRN: [id number] DOB: 11/25/1932  Referred by: [name], MD Reason for referral : No chief complaint on file.   Third unsuccessful telephone outreach was attempted today. The patient was referred to the pharmacist for assistance with care management and care coordination.   Follow Up Plan:   Carley Perdue [employer] Scheduler

## 2020-09-11 ENCOUNTER — Telehealth: Payer: Self-pay | Admitting: Internal Medicine

## 2020-09-11 DIAGNOSIS — C44722 Squamous cell carcinoma of skin of right lower limb, including hip: Secondary | ICD-10-CM | POA: Diagnosis not present

## 2020-09-11 DIAGNOSIS — Z85828 Personal history of other malignant neoplasm of skin: Secondary | ICD-10-CM | POA: Diagnosis not present

## 2020-09-11 NOTE — Progress Notes (Signed)
  Chronic Care Management   Note  09/11/2020 Name: [name] MRN: [id number] DOB: May 02, 1932  [name] is a 87 y.o. year old female who is a primary care patient of Burns, Claudina Lick, MD. I reached out to [employer] by phone today in response to a referral sent by [name] PCP, Quay Burow, Claudina Lick, MD.   [name] was given information about Chronic Care Management services today including:  1. CCM service includes personalized support from designated clinical staff supervised by her physician, including individualized plan of care and coordination with other care providers 2. 24/7 contact phone numbers for assistance for urgent and routine care needs. 3. Service will only be billed when office clinical staff spend 20 minutes or more in a month to coordinate care. 4. Only one practitioner may furnish and bill the service in a calendar month. 5. The patient may stop CCM services at any time (effective at the end of the month) by phone call to the office staff.   Patient did not agree to enrollment in care management services and does not wish to consider at this time.  Follow up plan:   [name] [employer] Scheduler

## 2020-09-11 NOTE — Telephone Encounter (Signed)
[facility] FYI:  Caller states she has allergy and cold symptoms. Caller states she has a cough and runny nose. Caller states she has a wheezing sound out of her nose and her chest too. Caller states she has Afib and has on and off fluid problems.  Advised to see PCP within 4 hours; patient will be coming in 5.18.2022

## 2020-09-11 NOTE — Telephone Encounter (Signed)
Patient called and said that she is experiencing cough, congestion and wheezing. Transferred to team health

## 2020-09-11 NOTE — Progress Notes (Signed)
Subjective:    Patient ID: [name], female    DOB: 1932/12/07, 87 y.o.   MRN: [id number]  HPI She is here for an acute visit for cold symptoms or allergy symptoms.   Her symptoms started 2 weeks ago - it was either a bad cold or allergies.  She is unable to tell.  Some of her symptoms have gotten better, but some are persistent.  She coughs a lot at night and typically gets a dry cough.  She notices it when she is laying in bed and reading.  She states nasal congestion, clogged ear sensation and sinus pressure, but feels that those are somewhat chronic from her allergies.She has had some wheezing or feeling of mucus in her chest.  She is also noted some chest tightness, but only when she rushes around..   She has tried taking an anti-histamine, flonase.  Her symptoms do not seem to be improving much.  She did take a COVID test and it was negative.  She was also concerned about some of the wheezing and cough because she has noticed for the past 3-4 days she has been gaining weight for no reason.  She has been gaining about a pound a day.  She denies any leg swelling or shortness of breath but was concerned about her heart.  She does have a history of atrial fibrillation and that put her into heart failure in the past.  She denies any palpitations or chest pain.  She is still walking and her energy level is fine so she did not think she was in atrial fibrillation.  She was told in the past to take an extra Lasix if she gained weight and she was not sure if she should do that or not.     Medications and allergies reviewed with patient and updated if appropriate.  Patient Active Problem List   Diagnosis Date Noted  . Acute on chronic congestive heart failure with left ventricular diastolic dysfunction ([place]) 07/13/2020  . Aortic atherosclerosis ([place]) 01/06/2020  . Cough 12/22/2019  . Palpitations 12/07/2019  . Atrial fibrillation ([place]) 12/07/2019  . PUD (peptic ulcer  disease) 12/07/2019  . Dyspnea on exertion 12/07/2019  . Eczema 07/27/2019  . CAD (coronary artery disease) 06/23/2019  . Epistaxis 04/19/2019  . NSTEMI (non-ST elevated myocardial infarction) ([place])   . Iron deficiency 12/26/2018  . Anemia 12/26/2018  . B12 deficiency 12/23/2018  . Discoloration of skin of finger 11/11/2018  . Chronic gastric ulcer without hemorrhage and without perforation 06/05/2018  . Constipation, chronic 06/05/2018  . Callus of foot 03/04/2018  . Hammertoes of both feet 03/04/2018  . History of peptic ulcer disease 12/21/2017  . Hypertension 12/21/2017  . Syncope 12/14/2017  . Macrocytic anemia 12/14/2017  . Near syncope 12/14/2017  . Left sided numbness 10/19/2017  . Small vessel disease, cerebrovascular 10/19/2017  . Numbness and tingling of left arm and leg 10/13/2017  . Anxiety 10/02/2017  . Macular pucker, right eye 07/24/2017  . Pseudophakia of both eyes 07/24/2017  . Prediabetes 09/29/2016  . Malignant neoplasm of upper lobe of right lung ([place]) 09/29/2016  . Leg cramps 01/29/2016  . Insomnia 09/12/2015  . Rectal bleed 09/12/2015  . S/P AVR 11/16/2014  . Branch retinal vein occlusion 10/12/2013  . Iris nevus, right 10/12/2013  . Aortic valve stenosis, severe   . Allergic rhinitis 09/10/2009  . Hyperlipidemia 11/30/2006  . Mitral valve disease 10/29/2006  . Osteopenia 10/29/2006  . History of right  breast cancer 10/29/2006    Current Outpatient Medications on File Prior to Visit  Medication Sig Dispense Refill  . acetaminophen (TYLENOL) 500 MG tablet Take 500-1,000 mg by mouth every 6 (six) hours as needed for mild pain or moderate pain.     Marland Kitchen ALPRAZolam (XANAX) 0.5 MG tablet TAKE 1 TABLET ONCE DAILY AS NEEDED FOR AN ANXIETY, DO NOT TAKE AT NIGHT. 90 tablet 0  . amiodarone (PACERONE) 100 MG tablet Take 100 mg by mouth daily.    Marland Kitchen apixaban (ELIQUIS) 5 MG TABS tablet Take 1 tablet (5 mg total) by mouth 2 (two) times daily. 60 tablet 6  . Calcium  Carb-Cholecalciferol 600-400 MG-UNIT TABS Take 1 tablet by mouth daily.    . Chloral Hydrate CRYS Take 10 mLs by mouth at bedtime as needed (Call  liquid into [facility]). 900 g 0  . Cholecalciferol (VITAMIN D-3) 1000 UNITS CAPS Take 1,000 Units by mouth daily.     . clindamycin (CLEOCIN) 300 MG capsule Take 600 mg by mouth See admin instructions. Take 600 mg 1 hour prior to dental work    . ferrous sulfate 325 (65 FE) MG tablet Take 325 mg by mouth every Monday, Wednesday, and Friday.    . fluticasone (FLONASE) 50 MCG/ACT nasal spray Place 2 sprays into both nostrils daily as needed for allergies.    . furosemide (LASIX) 20 MG tablet Take 1 tablet (20 mg total) by mouth daily. You may take 1 extra tablet daily for weight gain of 3 pounds. 90 tablet 3  . HYDROcodone-homatropine (HYDROMET) 5-1.5 MG/5ML syrup Take 5 mLs by mouth every 6 (six) hours as needed for cough. 120 mL 0  . ipratropium (ATROVENT) 0.06 % nasal spray SMARTSIG:2 Spray(s) Both Nares 3 Times Daily PRN    . levocetirizine (XYZAL) 5 MG tablet Take 5 mg by mouth at bedtime as needed for allergies.     . montelukast (SINGULAIR) 10 MG tablet TAKE 1 TABLET EACH DAY. 90 tablet 1  . nitroGLYCERIN (NITROSTAT) 0.4 MG SL tablet Place 1 tablet (0.4 mg total) under the tongue every 5 (five) minutes as needed for chest pain (CP or SOB). 25 tablet 3  . Ophthalmic Irrigation Solution (OCUSOFT EYE [place] OP) Apply 1 application to eye at bedtime.    . pantoprazole (PROTONIX) 40 MG tablet TAKE 1 TABLET BY MOUTH TWICE DAILY. 180 tablet 0  . polyethylene glycol (MIRALAX / GLYCOLAX) 17 g packet Take 17 g by mouth every other day.    . potassium chloride (KLOR-CON) 10 MEQ tablet Take 1 tablet (10 mEq total) by mouth daily. 90 tablet 3  . rosuvastatin (CRESTOR) 20 MG tablet TAKE 1 TABLET ONCE DAILY. 90 tablet 3  . tretinoin (RETIN-A) 0.1 % cream Apply 1 application topically every Monday, Wednesday, and Friday.     . Wheat Dextrin  (BENEFIBER DRINK MIX PO) Take 1 Dose by mouth every other day.      No current facility-administered medications on file prior to visit.    Past Medical History:  Diagnosis Date  . Allergy   . Anemia   . Anxiety   . Aortic valve stenosis, severe    s/p AVR // mild to mod paravalvular leakage // Echo 9/21: EF 55-60, no RWMA, mild LVH, GR 2 DD, mildly reduced RVSF, RVSP 38.7, moderate LAE, mild RAE, AVR with mean gradient 11 mmHg, mild paravalvular leakage  . Breast cancer ([place]) 2007   right  . Cataract   . Constipation   .  Dysrhythmia    A-fib  . Hiatal hernia    pt denies  . Hypercholesteremia   . Hypertension   . Hypoglycemia   . Osteopenia   . Personal history of radiation therapy   . Tingling   . Ulcer 2012   bleeding gastric Ulcer    Past Surgical History:  Procedure Laterality Date  . AORTIC VALVE REPLACEMENT N/A 11/16/2014   Procedure: AORTIC VALVE REPLACEMENT (AVR);  Surgeon: Gaye Pollack, MD;  Location: [place];  Service: Open Heart Surgery;  Laterality: N/A;  . BIOPSY  12/15/2017   Procedure: BIOPSY;  Surgeon: Rush Landmark Telford Nab., MD;  Location: [place];  Service: Gastroenterology;;  . BREAST BIOPSY    . BREAST LUMPECTOMY Right    2005?  Marland Kitchen BREAST SURGERY    . BRONCHIAL BIOPSY  03/06/2020   Procedure: BRONCHIAL BIOPSIES;  Surgeon: Collene Gobble, MD;  Location: [facility] ENDOSCOPY;  Service: Pulmonary;;  . BRONCHIAL BRUSHINGS  03/06/2020   Procedure: BRONCHIAL BRUSHINGS;  Surgeon: Collene Gobble, MD;  Location: [facility] ENDOSCOPY;  Service: Pulmonary;;  . BRONCHIAL NEEDLE ASPIRATION BIOPSY  03/06/2020   Procedure: BRONCHIAL NEEDLE ASPIRATION BIOPSIES;  Surgeon: Collene Gobble, MD;  Location: [facility] ENDOSCOPY;  Service: Pulmonary;;  . BRONCHIAL WASHINGS  03/06/2020   Procedure: BRONCHIAL WASHINGS;  Surgeon: Collene Gobble, MD;  Location: [facility] ENDOSCOPY;  Service: Pulmonary;;  . CARDIAC CATHETERIZATION N/A 10/18/2014   Procedure: Right/Left Heart Cath and Coronary  Angiography;  Surgeon: [name], MD;  Location: [place] CV LAB;  Service: Cardiovascular;  Laterality: N/A;  . CARDIOVERSION N/A 12/09/2019   Procedure: CARDIOVERSION;  Surgeon: Josue Hector, MD;  Location: [facility] ENDOSCOPY;  Service: Cardiovascular;  Laterality: N/A;  . CARDIOVERSION N/A 12/30/2019   Procedure: CARDIOVERSION;  Surgeon: Josue Hector, MD;  Location: [facility] ENDOSCOPY;  Service: Cardiovascular;  Laterality: N/A;  . CORONARY ANGIOGRAPHY N/A 02/04/2019   Procedure: CORONARY ANGIOGRAPHY;  Surgeon: Nelva Bush, MD;  Location: [place] CV LAB;  Service: Cardiovascular;  Laterality: N/A;  . COSMETIC SURGERY     face  . ESOPHAGOGASTRODUODENOSCOPY (EGD) WITH PROPOFOL N/A 12/15/2017   Procedure: ESOPHAGOGASTRODUODENOSCOPY (EGD) WITH PROPOFOL;  Surgeon: Rush Landmark Telford Nab., MD;  Location: [place];  Service: Gastroenterology;  Laterality: N/A;  . FIDUCIAL MARKER PLACEMENT  03/06/2020   Procedure: FIDUCIAL MARKER PLACEMENT;  Surgeon: Collene Gobble, MD;  Location: [facility] ENDOSCOPY;  Service: Pulmonary;;  . PARATHYROIDECTOMY    . TEE WITHOUT CARDIOVERSION N/A 11/16/2014   Procedure: TRANSESOPHAGEAL ECHOCARDIOGRAM (TEE);  Surgeon: Gaye Pollack, MD;  Location: [place];  Service: Open Heart Surgery;  Laterality: N/A;  . TEE WITHOUT CARDIOVERSION N/A 12/09/2019   Procedure: TRANSESOPHAGEAL ECHOCARDIOGRAM (TEE);  Surgeon: Josue Hector, MD;  Location: [facility] ENDOSCOPY;  Service: Cardiovascular;  Laterality: N/A;  . TONSILLECTOMY    . VIDEO BRONCHOSCOPY WITH ENDOBRONCHIAL NAVIGATION Right 03/06/2020   Procedure: VIDEO BRONCHOSCOPY WITH ENDOBRONCHIAL NAVIGATION;  Surgeon: Collene Gobble, MD;  Location: [facility] ENDOSCOPY;  Service: Pulmonary;  Laterality: Right;    Social History   Socioeconomic History  . Marital status: Unknown    Spouse name: Not on file  . Number of children: 3  . Years of education: 16  . Highest education level: Bachelor's degree (e.g., BA, AB, BS)  Occupational  History  . Occupation: Retired  Tobacco Use  . Smoking status: Former Smoker    Packs/day: 0.50    Years: 15.00    Pack years: 7.50    Types: Cigarettes  Start date: 89    Quit date: 1986    Years since quitting: 36.4  . Smokeless tobacco: Never Used  . Tobacco comment: quit smoking 1980  Vaping Use  . Vaping Use: Never used  Substance and Sexual Activity  . Alcohol use: Yes    Alcohol/week: 7.0 standard drinks    Types: 7 Glasses of wine per week    Comment: wine nightly with dinner  . Drug use: No  . Sexual activity: Not Currently  Other Topics Concern  . Not on file  Social History Narrative   ** Merged History Encounter **       Lives at [facility] with her husband. Right-handed. Caffeine use: 3-4 cups per day (some tea, mixes coffee with decaf).   Social Determinants of Health   Financial Resource Strain: Not on file  Food Insecurity: Not on file  Transportation Needs: Not on file  Physical Activity: Not on file  Stress: Not on file  Social Connections: Not on file    Family History  Problem Relation Age of Onset  . Hypertension Mother   . Rectal cancer Mother   . Colon cancer Mother   . Kidney disease Father   . Cancer Cousin 63       breast and ovarian   . Esophageal cancer Neg Hx   . Stomach cancer Neg Hx   . Colon polyps Neg Hx     Review of Systems  Constitutional: Negative for chills and fever.  HENT: Positive for congestion (chronic - allergies) and sinus pressure (chronic - allergies). Negative for ear pain (mild cogged sensation) and sore throat.   Respiratory: Positive for cough (at night only - dry), chest tightness (only when she rushes) and wheezing (wheeze or mucus in chest). Negative for shortness of breath.   Cardiovascular: Negative for chest pain, palpitations and leg swelling.  Neurological: Negative for dizziness and headaches.       Objective:   Vitals:   09/12/20 0752  BP: 118/72  Pulse: 80  Temp: 98.2 F (36.8 C)   SpO2: 92%   BP Readings from Last 3 Encounters:  09/12/20 118/72  07/19/20 124/70  07/13/20 114/72   Wt Readings from Last 3 Encounters:  09/12/20 159 lb (72.1 kg)  07/19/20 159 lb (72.1 kg)  07/13/20 160 lb (72.6 kg)   Body mass index is 26.46 kg/m.   Physical Exam    GENERAL APPEARANCE: Appears stated age, well appearing, NAD, sounds congested EYES: conjunctiva clear, no icterus HENT: bilateral tympanic membranes and ear canals normal, oropharynx with no erythema or exudates, trachea midline, no cervical or supraclavicular lymphadenopathy LUNGS: Unlabored breathing, good air entry bilaterally, clear to auscultation without wheeze or crackles CARDIOVASCULAR: Normal S1,S2 , no edema SKIN: Warm, dry      Assessment & Plan:    See Problem List for Assessment and Plan of chronic medical problems.    This visit occurred during the SARS-CoV-2 public health emergency.  Safety protocols were in place, including screening questions prior to the visit, additional usage of staff PPE, and extensive cleaning of exam room while observing appropriate contact time as indicated for disinfecting solutions.

## 2020-09-11 NOTE — Progress Notes (Signed)
  Chronic Care Management   Note  09/11/2020 Name: [name] MRN: [id number] DOB: 21-Jun-1932  [name] is a 87 y.o. year old female who is a primary care patient of Burns, Claudina Lick, MD. I reached out to [facility] by phone today in response to a referral sent by Ms. Pamela Oz Forst's PCP, Quay Burow, Claudina Lick, MD.   [name] was given information about Chronic Care Management services today including:  1. CCM service includes personalized support from designated clinical staff supervised by her physician, including individualized plan of care and coordination with other care providers 2. 24/7 contact phone numbers for assistance for urgent and routine care needs. 3. Service will only be billed when office clinical staff spend 20 minutes or more in a month to coordinate care. 4. Only one practitioner may furnish and bill the service in a calendar month. 5. The patient may stop CCM services at any time (effective at the end of the month) by phone call to the office staff.   Patient agreed to services and verbal consent obtained.   Follow up plan:   Carley Perdue [employer] Scheduler

## 2020-09-12 ENCOUNTER — Encounter: Payer: Self-pay | Admitting: Internal Medicine

## 2020-09-12 ENCOUNTER — Other Ambulatory Visit: Payer: Self-pay

## 2020-09-12 ENCOUNTER — Ambulatory Visit (INDEPENDENT_AMBULATORY_CARE_PROVIDER_SITE_OTHER): Payer: Medicare Other | Admitting: Internal Medicine

## 2020-09-12 VITALS — BP 118/72 | HR 80 | Temp 98.2°F | Ht 65.0 in | Wt 159.0 lb

## 2020-09-12 DIAGNOSIS — I5033 Acute on chronic diastolic (congestive) heart failure: Secondary | ICD-10-CM

## 2020-09-12 DIAGNOSIS — J069 Acute upper respiratory infection, unspecified: Secondary | ICD-10-CM | POA: Diagnosis not present

## 2020-09-12 DIAGNOSIS — J209 Acute bronchitis, unspecified: Secondary | ICD-10-CM | POA: Insufficient documentation

## 2020-09-12 DIAGNOSIS — I4891 Unspecified atrial fibrillation: Secondary | ICD-10-CM | POA: Diagnosis not present

## 2020-09-12 DIAGNOSIS — I2510 Atherosclerotic heart disease of native coronary artery without angina pectoris: Secondary | ICD-10-CM

## 2020-09-12 MED ORDER — DOXYCYCLINE HYCLATE 100 MG PO TABS: 100.0000 mg | ORAL_TABLET | Freq: Two times a day (BID) | ORAL | 0 refills | Status: DC

## 2020-09-12 NOTE — Assessment & Plan Note (Addendum)
Paroxysmal In atrial fibrillation here today-heard irregularity on exam and EKG confirmed A. fib at 77 bpm May have some light decompensation of her chronic diastolic heart failure-has had weight gain, but no shortness of breath, fatigue or palpitations On amiodarone, Eliquis Advised her to take an extra Lasix today and possibly tomorrow based on her weight Will let cardiology know so they are aware She is currently doing well and not having the symptoms she had when she was first diagnosed  EKG: Atrial fibrillation at 77 bpm, nonspecific T wave abnormality.She was in sinus rhythm when she had her EKG in 06/2020, otherwise no significant changes

## 2020-09-12 NOTE — Patient Instructions (Addendum)
   You had an EKG today.    Take the antibiotic as prescribed.  Take an extra dose of the lasix.     Continue your allergy medications.   Please call if there is no improvement in your symptoms.

## 2020-09-12 NOTE — Assessment & Plan Note (Signed)
Acute on chronic diastolic heart failure For the past 3-4 days she has noticed increased weight gain-no increase in shortness of breath or leg swelling She does have a URI and is in A. fib today-both could be contributing Advised her to take an extra Lasix today and see how she responds and she may need to do that for another day as well Continue to weigh daily

## 2020-09-12 NOTE — Assessment & Plan Note (Signed)
Acute She does have her typical allergies, but also likely has bacterial URI on top of that given her symptoms and duration Start doxycycline 100 mg twice daily for Continue allergy medication She will call if there is no improvement

## 2020-09-14 ENCOUNTER — Telehealth: Payer: Self-pay | Admitting: *Deleted

## 2020-09-14 NOTE — Telephone Encounter (Signed)
Spoke with the patient to let her know that we have moved her upcoming appointment with the PA to 9:30 am 10/01/2020 and this appointment is a telephone call to discuss her CT results.  She verbalized understanding.  [name]. [name], BSN

## 2020-09-19 ENCOUNTER — Telehealth: Payer: Self-pay

## 2020-09-19 ENCOUNTER — Telehealth: Payer: Self-pay | Admitting: Internal Medicine

## 2020-09-19 ENCOUNTER — Telehealth: Payer: Self-pay | Admitting: Cardiovascular Disease

## 2020-09-19 DIAGNOSIS — I480 Paroxysmal atrial fibrillation: Secondary | ICD-10-CM

## 2020-09-19 NOTE — Telephone Encounter (Signed)
Dr cooper patient

## 2020-09-19 NOTE — Telephone Encounter (Signed)
[facility] heart care Triage has called concerning the patient.  The patient had an EKG done on 5.18.22 in our office and they are waiting for the results.  Please advise. Ask for Triage- [phone]

## 2020-09-19 NOTE — Telephone Encounter (Signed)
Left message to call back  

## 2020-09-19 NOTE — Telephone Encounter (Signed)
Pt c/o swelling: STAT is pt has developed SOB within 24 hours  1. How much weight have you gained and in what time span? 4 pounds  2. If swelling, where is the swelling located? ankles  3. Are you currently taking a fluid pill? yes  4. Are you currently SOB? No            5. Do you have a log of your daily weights (if so, list)? Patient states she has a scale  6. Have you gained 3 pounds in a day or 5 pounds in a week? Yes   7. Have you traveled recently? No      Patient would like to be seen as well   PT IS STATING THAT SHE IS OUT OF RHYTHM AND HAS BEEN WAITING ON A PHONE CALL SINCE 9:30, NOTE WASNT ROUTED,

## 2020-09-19 NOTE — Telephone Encounter (Deleted)
[facility] heart care Triage has called conerning the patient.  There was an EKG done here in the office on 5.18.22 and they are waiting to get that in to review it.  Please advise

## 2020-09-19 NOTE — Telephone Encounter (Signed)
Pt c/o swelling: STAT is pt has developed SOB within 24 hours  1) How much weight have you gained and in what time span? 4 pounds  2) If swelling, where is the swelling located? ankles  3) Are you currently taking a fluid pill? yes  4) Are you currently SOB? No   5) Do you have a log of your daily weights (if so, list)? Patient states she has a scale  6) Have you gained 3 pounds in a day or 5 pounds in a week? Yes   7) Have you traveled recently? No    Patient would like to be seen as well

## 2020-09-19 NOTE — Telephone Encounter (Signed)
Patient reports that she called into the office today at 9 am and has not received a call back.  She reports that she had weakness in her legs yesterday but feels okay today.  She expressed that her weight is up.  She was unable to give me her last 5 daily weights.  Only able to tell me that her weight ranges from 156 lbs-157lbs.  Two days ago her weight was 159 lbs.  So she has doubled up on her furosemide for the past 2 days and reports a 2 lb weight loss.  When asked if she has consumed an increased amount of salt in her diet through processed foods and take out.  She said not really, when asked further questions she only answered that she had fast food about a week ago. She reports that she takes all of her medicine as ordered and hasn't missed any doses.   She also reports that her PCP Dr. Quay Burow did an EKG on her at 09/12/20 OV and was told her heart was out of rhythm.  She said office was supposed to fax ekg over.  I explained to her that I don't see any record of EKG in our system.  She requested that I call PCP office to request a copy.  I called PCP office they were unable to locate ekg.  Expressed they would do some digging and call our office back.  I will route this note to RN to review.

## 2020-09-19 NOTE — Telephone Encounter (Signed)
See other phone note from today.

## 2020-09-20 NOTE — Telephone Encounter (Signed)
Spoke with Curt Bears today and EKG faxed directly to her. Fax conformation received.

## 2020-09-20 NOTE — Telephone Encounter (Signed)
Dr. Burt Knack reviewed EKG. Afib in 70s. Per Dr. Burt Knack, patient is to INCREASE AMIODARONE to 200 mg daily and establish with Afib Clinic to decide rate control vs DCCV. Will reiterate importance of taking Eliquis as directed.  Left message to call back.  AFIB CLINIC INFORMATION: The AFib Clinic is located in the [facility]ty Clin[facility] at [facility]. Parking instructions/directions: [address] (off [address]). When you pull in to Entrance C, there is an underground parking garage to your right. The code to enter the garage is 4233. Take the elevators to the first floor. Follow the signs to the Heart and Vascular Specialty Clinics. You will see registration at the end of the hallway.  Phone number: [phone]

## 2020-09-20 NOTE — Telephone Encounter (Signed)
Ms. Siess reports she has a severe sinus infection she is taking antibiotics to treat.  When she went to Dr. Quay Burow' office, she was instructed to double her Lasix for 2 days for increased swelling and weight gain of 4 pounds.  After day one, she lost two pounds. She has not weighed yet today but does feel "lighter." Overall, she feels better today. She is nervous about going in and out of afib since she didn't know she was out of rhythm at Dr. Quay Burow' appointment. Reiterated to her the importance of taking Eliquis as directed for protection from stroke. She is most concerned about her EKG at Dr. Quay Burow' office and is angry Cardiology has not reviewed yet. Reiterated to her that no EKG has been received and that it was requested yesterday evening. Informed her that if it is not received by lunch time I will call and request again.  She understands Dr. Burt Knack will review and she will be called with his recommendations.

## 2020-09-20 NOTE — Telephone Encounter (Addendum)
Will request Dr. Quay Burow' office to either upload EKG to Epic for viewing or fax to [phone] attn: Valetta Fuller, RN

## 2020-09-20 NOTE — Telephone Encounter (Signed)
Spoke with Angela Nevin from Dr. Quay Burow' office. She will refax EKG.

## 2020-09-21 MED ORDER — AMIODARONE HCL 200 MG PO TABS: 200.0000 mg | ORAL_TABLET | Freq: Every day | ORAL | 3 refills | Status: DC

## 2020-09-21 NOTE — Telephone Encounter (Signed)
Agree thank you 

## 2020-09-21 NOTE — Telephone Encounter (Signed)
Follow up:    Patient returning a call back from yesterday. Patient also states no one ever call her back. Please call patient back.

## 2020-09-21 NOTE — Telephone Encounter (Signed)
RN returned call to patient to update of Dr. York Cerise recommendations as well as review  AFIB clinic directions. Patient verbalized understanding and in agreement with plan. RN explained importance of taking eliquis as directed. Patient verbalized understanding. MAR updated. Referral entered for AFIB clinic. Encouraged patient to contact the office with any questions or concerns and advised of ED precautions if symptoms were to worsen. Patient verbalized understanding.

## 2020-09-21 NOTE — Telephone Encounter (Signed)
Spoke with pt and scheduled pt to see AF Clinic.  She preferred to wait until 6/9 for appt.

## 2020-09-25 ENCOUNTER — Encounter: Payer: Self-pay | Admitting: Radiation Oncology

## 2020-09-25 NOTE — Addendum Note (Signed)
Addended by: Marcina Millard on: 09/25/2020 11:23 AM   Modules accepted: Orders

## 2020-09-28 ENCOUNTER — Encounter: Payer: Self-pay | Admitting: Internal Medicine

## 2020-09-30 MED ORDER — CHLORAL HYDRATE CRYS: CRYSTALS | 0 refills | Status: DC

## 2020-10-01 ENCOUNTER — Ambulatory Visit: Payer: Medicare Other | Admitting: Radiation Oncology

## 2020-10-01 ENCOUNTER — Telehealth: Payer: Self-pay | Admitting: Nurse Practitioner

## 2020-10-01 NOTE — Telephone Encounter (Signed)
Scheduled appointment per 06/06 sch msg. Patient is aware.

## 2020-10-03 ENCOUNTER — Encounter: Payer: Medicare Other | Admitting: Nurse Practitioner

## 2020-10-03 NOTE — Progress Notes (Signed)
Primary Care Physician: [name], MD Primary Cardiologist: Dr Burt Knack Primary Electrophysiologist: none Referring Physician: Dr Earnestine Leys [name] is a 87 y.o. female with a history of bicuspid aortic valve s/p AVR 2016, CAD, HTN, HLD, chronic diastolic CHF, atrial fibrillation who presents for consultation in the [facility]. Patient has been maintained on amiodarone for rhythm control. She is on Eliquis for a CHADS2VASC score of 6. Patient was found to be back in afib at PCP office visit on 09/12/20. ECG reviewed by Dr Burt Knack who confirmed afib. Her amiodarone was increased. She also increased her Lasix for 2 days as she had noted 4 lbs weight gain. She is feeling better today and is back in SR.   Today, she denies symptoms of palpitations, chest pain, shortness of breath, orthopnea, PND, lower extremity edema, dizziness, presyncope, syncope, snoring, daytime somnolence, bleeding, or neurologic sequela. The patient is tolerating medications without difficulties and is otherwise without complaint today.    Atrial Fibrillation Risk Factors:  she does not have symptoms or diagnosis of sleep apnea. she does not have a history of rheumatic fever.   she has a BMI of Body mass index is 26.66 kg/m.Marland Kitchen Filed Weights   10/04/20 0958  Weight: 72.7 kg    Family History  Problem Relation Age of Onset   Hypertension Mother    Rectal cancer Mother    Colon cancer Mother    Kidney disease Father    Cancer Cousin 19       breast and ovarian    Esophageal cancer Neg Hx    Stomach cancer Neg Hx    Colon polyps Neg Hx      Atrial Fibrillation Management history:  Previous antiarrhythmic drugs: amiodarone  Previous cardioversions: 12/30/19 Previous ablations: none CHADS2VASC score: 6 Anticoagulation history: Eliquis   Past Medical History:  Diagnosis Date   Allergy    Anemia    Anxiety    Aortic valve stenosis, severe    s/p AVR // mild  to mod paravalvular leakage // Echo 9/21: EF 55-60, no RWMA, mild LVH, GR 2 DD, mildly reduced RVSF, RVSP 38.7, moderate LAE, mild RAE, AVR with mean gradient 11 mmHg, mild paravalvular leakage   Breast cancer ([place]) 2007   right   Cataract    Constipation    Dysrhythmia    A-fib   Hiatal hernia    pt denies   Hypercholesteremia    Hypertension    Hypoglycemia    Osteopenia    Personal history of radiation therapy    Tingling    Ulcer 2012   bleeding gastric Ulcer   Past Surgical History:  Procedure Laterality Date   AORTIC VALVE REPLACEMENT N/A 11/16/2014   Procedure: AORTIC VALVE REPLACEMENT (AVR);  Surgeon: Gaye Pollack, MD;  Location: [place];  Service: Open Heart Surgery;  Laterality: N/A;   BIOPSY  12/15/2017   Procedure: BIOPSY;  Surgeon: Rush Landmark Telford Nab., MD;  Location: [place];  Service: Gastroenterology;;   BREAST BIOPSY     BREAST LUMPECTOMY Right    2005?   BREAST SURGERY     BRONCHIAL BIOPSY  03/06/2020   Procedure: BRONCHIAL BIOPSIES;  Surgeon: Collene Gobble, MD;  Location: [facility] ENDOSCOPY;  Service: Pulmonary;;   BRONCHIAL BRUSHINGS  03/06/2020   Procedure: BRONCHIAL BRUSHINGS;  Surgeon: Collene Gobble, MD;  Location: [facility] ENDOSCOPY;  Service: Pulmonary;;   BRONCHIAL NEEDLE ASPIRATION BIOPSY  03/06/2020   Procedure: BRONCHIAL NEEDLE  ASPIRATION BIOPSIES;  Surgeon: Collene Gobble, MD;  Location: [facility] ENDOSCOPY;  Service: Pulmonary;;   BRONCHIAL WASHINGS  03/06/2020   Procedure: BRONCHIAL WASHINGS;  Surgeon: Collene Gobble, MD;  Location: [facility] ENDOSCOPY;  Service: Pulmonary;;   CARDIAC CATHETERIZATION N/A 10/18/2014   Procedure: Right/Left Heart Cath and Coronary Angiography;  Surgeon: [name], MD;  Location: [place] CV LAB;  Service: Cardiovascular;  Laterality: N/A;   CARDIOVERSION N/A 12/09/2019   Procedure: CARDIOVERSION;  Surgeon: Josue Hector, MD;  Location: [place];  Service: Cardiovascular;  Laterality: N/A;   CARDIOVERSION N/A 12/30/2019    Procedure: CARDIOVERSION;  Surgeon: Josue Hector, MD;  Location: [facility] ENDOSCOPY;  Service: Cardiovascular;  Laterality: N/A;   CORONARY ANGIOGRAPHY N/A 02/04/2019   Procedure: CORONARY ANGIOGRAPHY;  Surgeon: Nelva Bush, MD;  Location: [place] CV LAB;  Service: Cardiovascular;  Laterality: N/A;   COSMETIC SURGERY     face   ESOPHAGOGASTRODUODENOSCOPY (EGD) WITH PROPOFOL N/A 12/15/2017   Procedure: ESOPHAGOGASTRODUODENOSCOPY (EGD) WITH PROPOFOL;  Surgeon: Rush Landmark Telford Nab., MD;  Location: [place];  Service: Gastroenterology;  Laterality: N/A;   FIDUCIAL MARKER PLACEMENT  03/06/2020   Procedure: FIDUCIAL MARKER PLACEMENT;  Surgeon: Collene Gobble, MD;  Location: [facility] ENDOSCOPY;  Service: Pulmonary;;   PARATHYROIDECTOMY     TEE WITHOUT CARDIOVERSION N/A 11/16/2014   Procedure: TRANSESOPHAGEAL ECHOCARDIOGRAM (TEE);  Surgeon: Gaye Pollack, MD;  Location: [place];  Service: Open Heart Surgery;  Laterality: N/A;   TEE WITHOUT CARDIOVERSION N/A 12/09/2019   Procedure: TRANSESOPHAGEAL ECHOCARDIOGRAM (TEE);  Surgeon: Josue Hector, MD;  Location: [facility] ENDOSCOPY;  Service: Cardiovascular;  Laterality: N/A;   TONSILLECTOMY     VIDEO BRONCHOSCOPY WITH ENDOBRONCHIAL NAVIGATION Right 03/06/2020   Procedure: VIDEO BRONCHOSCOPY WITH ENDOBRONCHIAL NAVIGATION;  Surgeon: Collene Gobble, MD;  Location: [facility] ENDOSCOPY;  Service: Pulmonary;  Laterality: Right;    Current Outpatient Medications  Medication Sig Dispense Refill   acetaminophen (TYLENOL) 500 MG tablet Take 500-1,000 mg by mouth every 6 (six) hours as needed for mild pain or moderate pain.      ALPRAZolam (XANAX) 0.5 MG tablet TAKE 1 TABLET ONCE DAILY AS NEEDED FOR AN ANXIETY, DO NOT TAKE AT NIGHT. 90 tablet 0   apixaban (ELIQUIS) 5 MG TABS tablet Take 1 tablet (5 mg total) by mouth 2 (two) times daily. 60 tablet 6   Calcium Carb-Cholecalciferol 600-400 MG-UNIT TABS Take 1 tablet by mouth daily.     Chloral Hydrate CRYS Take 10 mLs by  mouth at bedtime as needed (Call  liquid into [place]). 900 g 0   Cholecalciferol (VITAMIN D-3) 1000 UNITS CAPS Take 1,000 Units by mouth daily.      clindamycin (CLEOCIN) 300 MG capsule Take 600 mg by mouth See admin instructions. Take 600 mg 1 hour prior to dental work     doxycycline (VIBRA-TABS) 100 MG tablet Take 1 tablet (100 mg total) by mouth 2 (two) times daily. 20 tablet 0   ferrous sulfate 325 (65 FE) MG tablet Take 325 mg by mouth every Monday, Wednesday, and Friday.     fluticasone (FLONASE) 50 MCG/ACT nasal spray Place 2 sprays into both nostrils daily as needed for allergies.     furosemide (LASIX) 20 MG tablet Take 1 tablet (20 mg total) by mouth daily. You may take 1 extra tablet daily for weight gain of 3 pounds. 90 tablet 3   HYDROcodone-homatropine (HYDROMET) 5-1.5 MG/5ML syrup Take 5 mLs by mouth every 6 (six)  hours as needed for cough. 120 mL 0   ipratropium (ATROVENT) 0.06 % nasal spray SMARTSIG:2 Spray(s) Both Nares 3 Times Daily PRN     levocetirizine (XYZAL) 5 MG tablet Take 5 mg by mouth at bedtime as needed for allergies.      montelukast (SINGULAIR) 10 MG tablet TAKE 1 TABLET EACH DAY. 90 tablet 1   nitroGLYCERIN (NITROSTAT) 0.4 MG SL tablet Place 1 tablet (0.4 mg total) under the tongue every 5 (five) minutes as needed for chest pain (CP or SOB). 25 tablet 3   Ophthalmic Irrigation Solution (OCUSOFT EYE [place] OP) Apply 1 application to eye at bedtime.     pantoprazole (PROTONIX) 40 MG tablet TAKE 1 TABLET BY MOUTH TWICE DAILY. 180 tablet 0   polyethylene glycol (MIRALAX / GLYCOLAX) 17 g packet Take 17 g by mouth every other day.     potassium chloride (KLOR-CON) 10 MEQ tablet Take 1 tablet (10 mEq total) by mouth daily. 90 tablet 3   rosuvastatin (CRESTOR) 20 MG tablet TAKE 1 TABLET ONCE DAILY. 90 tablet 3   tretinoin (RETIN-A) 0.1 % cream Apply 1 application topically every Monday, Wednesday, and Friday.      Wheat Dextrin (BENEFIBER DRINK MIX PO)  Take 1 Dose by mouth every other day.     amiodarone (PACERONE) 200 MG tablet Take 0.5 tablets (100 mg total) by mouth daily. 45 tablet 2   No current facility-administered medications for this encounter.    Allergies  Allergen Reactions   Lactose Intolerance (Gi) Other (See Comments)    GI upset   Amoxicillin-Pot Clavulanate Hives and Rash   Penicillins Hives and Rash    30 years   Sulfa Antibiotics Hives and Rash   Sulfonamide Derivatives Hives and Rash    Social History   Socioeconomic History   Marital status: Unknown    Spouse name: Not on file   Number of children: 3   Years of education: 16   Highest education level: Bachelor's degree (e.g., BA, AB, BS)  Occupational History   Occupation: Retired  Tobacco Use   Smoking status: Former    Packs/day: 0.50    Years: 15.00    Pack years: 7.50    Types: Cigarettes    Start date: 1971    Quit date: 1986    Years since quitting: 36.4   Smokeless tobacco: Never   Tobacco comments:    quit smoking 1980  Vaping Use   Vaping Use: Never used  Substance and Sexual Activity   Alcohol use: Yes    Alcohol/week: 7.0 standard drinks    Types: 7 Glasses of wine per week    Comment: wine nightly with dinner   Drug use: No   Sexual activity: Not Currently  Other Topics Concern   Not on file  Social History Narrative   ** Merged History Encounter **       Lives at [facility] with her husband. Right-handed. Caffeine use: 3-4 cups per day (some tea, mixes coffee with decaf).   Social Determinants of Health   Financial Resource Strain: Not on file  Food Insecurity: Not on file  Transportation Needs: Not on file  Physical Activity: Not on file  Stress: Not on file  Social Connections: Not on file  Intimate Partner Violence: Not on file     ROS- All systems are reviewed and negative except as per the HPI above.  Physical Exam: Vitals:   10/04/20 0958  BP: (!) 142/82  Pulse: 60  SpO2: 97%  Weight: 72.7 kg   Height: 5\' 5"  (1.651 m)    GEN- The patient is a well appearing elderly female, alert and oriented x 3 today.   Head- normocephalic, atraumatic Eyes-  Sclera clear, conjunctiva pink Ears- hearing intact Oropharynx- clear Neck- supple  Lungs- Clear to ausculation bilaterally, normal work of breathing Heart- slightly irregular rate and rhythm, no murmurs, rubs or gallops  GI- soft, NT, ND, + BS Extremities- no clubbing, cyanosis, or edema MS- no significant deformity or atrophy Skin- no rash or lesion Psych- euthymic mood, full affect Neuro- strength and sensation are intact  Wt Readings from Last 3 Encounters:  10/04/20 72.7 kg  09/12/20 72.1 kg  07/19/20 72.1 kg    EKG today demonstrates  SR with blocked PACs Vent. rate 60 BPM PR interval * ms QRS duration 96 ms QT/QTcB 510/510 ms  Echo 07/17/20 demonstrated  1. Left ventricular ejection fraction, by estimation, is 70 to 75%. The  left ventricle has hyperdynamic function. The left ventricle has no  regional wall motion abnormalities. Left ventricular diastolic parameters  are indeterminate.   2. Right ventricular systolic function is mildly reduced. The right  ventricular size is normal. There is mildly elevated pulmonary artery  systolic pressure.   3. Right atrial size was moderately dilated.   4. The mitral valve is normal in structure. No evidence of mitral valve  regurgitation.   5. The aortic valve has been repaired/replaced. Aortic valve  regurgitation is mild to moderate. There is a 21 mm Edwards bioprosthetic  valve present in the aortic position. Procedure Date: 11/16/14.   Comparison(s): 01/20/20 EF 55-60%. AV 60mmHg mean PG, 8mmHg peak PG.   Epic records are reviewed at length today  CHA2DS2-VASc Score = 6  The patient's score is based upon: CHF History: Yes HTN History: Yes Diabetes History: No Stroke History: No Vascular Disease History: Yes Age Score: 2 Gender Score: 1      ASSESSMENT AND  PLAN: 1. Persistent Atrial Fibrillation (ICD10:  I48.19) The patient's CHA2DS2-VASc score is 6, indicating a 9.7% annual risk of stroke.   Patient is back in SR today. Moving forward, she does have increased fatigue in afib but this does not limit her activity. She is not sure that she would want another DCCV.  Decrease amiodarone back to 100 mg daily Continue Eliquis 5 mg BID  2. Secondary Hypercoagulable State (ICD10:  D68.69) The patient is at significant risk for stroke/thromboembolism based upon her CHA2DS2-VASc Score of 6.  Continue Apixaban (Eliquis).   3. CAD No anginal symptoms.  4. Chronic diastolic CHF No signs or symptoms of fluid overload today.  5. Aortic stenosis Bicuspid aortic valve S/p AVR 2016   Follow up with Dr Burt Knack as scheduled. AF clinic in 6 months.    [facility] [address] [place], [place] [address] [phone] 10/04/2020 11:30 AM

## 2020-10-03 NOTE — Progress Notes (Signed)
Meaningful use is complete for appointment on 6/14 with [name]. Patient is aware that this is a phone visit.

## 2020-10-04 ENCOUNTER — Other Ambulatory Visit: Payer: Self-pay

## 2020-10-04 ENCOUNTER — Encounter (HOSPITAL_COMMUNITY): Payer: Self-pay | Admitting: Physician Assistant

## 2020-10-04 ENCOUNTER — Ambulatory Visit (HOSPITAL_COMMUNITY)
Admission: RE | Admit: 2020-10-04 | Discharge: 2020-10-04 | Disposition: A | Discharge status: Home / Self Care | Payer: Medicare Other | Source: Ambulatory Visit | Attending: Nurse Practitioner | Admitting: Nurse Practitioner

## 2020-10-04 ENCOUNTER — Telehealth: Payer: Self-pay | Admitting: *Deleted

## 2020-10-04 VITALS — BP 142/82 | HR 60 | Ht 65.0 in | Wt 160.2 lb

## 2020-10-04 DIAGNOSIS — I110 Hypertensive heart disease with heart failure: Secondary | ICD-10-CM | POA: Diagnosis not present

## 2020-10-04 DIAGNOSIS — K6389 Other specified diseases of intestine: Secondary | ICD-10-CM | POA: Diagnosis not present

## 2020-10-04 DIAGNOSIS — I2510 Atherosclerotic heart disease of native coronary artery without angina pectoris: Secondary | ICD-10-CM | POA: Insufficient documentation

## 2020-10-04 DIAGNOSIS — C3411 Malignant neoplasm of upper lobe, right bronchus or lung: Secondary | ICD-10-CM | POA: Insufficient documentation

## 2020-10-04 DIAGNOSIS — E7800 Pure hypercholesterolemia, unspecified: Secondary | ICD-10-CM | POA: Diagnosis not present

## 2020-10-04 DIAGNOSIS — Z923 Personal history of irradiation: Secondary | ICD-10-CM | POA: Insufficient documentation

## 2020-10-04 DIAGNOSIS — Z79899 Other long term (current) drug therapy: Secondary | ICD-10-CM | POA: Insufficient documentation

## 2020-10-04 DIAGNOSIS — Z952 Presence of prosthetic heart valve: Secondary | ICD-10-CM | POA: Diagnosis not present

## 2020-10-04 DIAGNOSIS — D6869 Other thrombophilia: Secondary | ICD-10-CM

## 2020-10-04 DIAGNOSIS — I350 Nonrheumatic aortic (valve) stenosis: Secondary | ICD-10-CM | POA: Diagnosis not present

## 2020-10-04 DIAGNOSIS — Z7901 Long term (current) use of anticoagulants: Secondary | ICD-10-CM | POA: Diagnosis not present

## 2020-10-04 DIAGNOSIS — Z87891 Personal history of nicotine dependence: Secondary | ICD-10-CM | POA: Insufficient documentation

## 2020-10-04 DIAGNOSIS — I5032 Chronic diastolic (congestive) heart failure: Secondary | ICD-10-CM | POA: Diagnosis not present

## 2020-10-04 DIAGNOSIS — I4819 Other persistent atrial fibrillation: Secondary | ICD-10-CM

## 2020-10-04 MED ORDER — AMIODARONE HCL 200 MG PO TABS: 100.0000 mg | ORAL_TABLET | Freq: Every day | ORAL | 2 refills | Status: DC

## 2020-10-04 NOTE — Patient Instructions (Signed)
Decrease amiodarone to 100mg  daily ( 1/2 of your 200mg  tablet)

## 2020-10-04 NOTE — Telephone Encounter (Signed)
CALLED PATIENT TO INFORM THAT [name] WILL CALL HER ON 10-08-20 @ 10:30 AM, SPOKE WITH PATIENT AND SHE IS AWARE OF THIS TELEPHONE FU

## 2020-10-05 ENCOUNTER — Telehealth: Payer: Self-pay | Admitting: *Deleted

## 2020-10-05 ENCOUNTER — Telehealth: Payer: Self-pay | Admitting: Radiation Oncology

## 2020-10-05 ENCOUNTER — Ambulatory Visit (HOSPITAL_COMMUNITY)
Admission: RE | Admit: 2020-10-05 | Discharge: 2020-10-05 | Disposition: A | Discharge status: Home / Self Care | Payer: Medicare Other | Source: Ambulatory Visit | Attending: Radiation Oncology | Admitting: Radiation Oncology

## 2020-10-05 DIAGNOSIS — K6389 Other specified diseases of intestine: Secondary | ICD-10-CM

## 2020-10-05 DIAGNOSIS — D6869 Other thrombophilia: Secondary | ICD-10-CM | POA: Diagnosis not present

## 2020-10-05 DIAGNOSIS — C3411 Malignant neoplasm of upper lobe, right bronchus or lung: Secondary | ICD-10-CM | POA: Diagnosis not present

## 2020-10-05 DIAGNOSIS — I4819 Other persistent atrial fibrillation: Secondary | ICD-10-CM | POA: Diagnosis not present

## 2020-10-05 DIAGNOSIS — I700 Atherosclerosis of aorta: Secondary | ICD-10-CM | POA: Diagnosis not present

## 2020-10-05 DIAGNOSIS — C3490 Malignant neoplasm of unspecified part of unspecified bronchus or lung: Secondary | ICD-10-CM

## 2020-10-05 DIAGNOSIS — Z7901 Long term (current) use of anticoagulants: Secondary | ICD-10-CM | POA: Diagnosis not present

## 2020-10-05 DIAGNOSIS — I2510 Atherosclerotic heart disease of native coronary artery without angina pectoris: Secondary | ICD-10-CM | POA: Diagnosis not present

## 2020-10-05 DIAGNOSIS — Z923 Personal history of irradiation: Secondary | ICD-10-CM | POA: Diagnosis not present

## 2020-10-05 DIAGNOSIS — J929 Pleural plaque without asbestos: Secondary | ICD-10-CM | POA: Diagnosis not present

## 2020-10-05 LAB — POCT I-STAT CREATININE: Creatinine, Ser: 0.9 mg/dL (ref 0.44–1.00)

## 2020-10-05 MED ORDER — SODIUM CHLORIDE (PF) 0.9 % IJ SOLN
INTRAMUSCULAR | Status: AC
  Filled 2020-10-05: qty 50

## 2020-10-05 MED ORDER — IOHEXOL 300 MG/ML  SOLN
75.0000 mL | Freq: Once | INTRAMUSCULAR | Status: AC | PRN
  Administered 2020-10-05: 75 mL via INTRAVENOUS

## 2020-10-05 NOTE — Telephone Encounter (Signed)
I called the patient today after receiving a call from radiology about pneumatosis of the splenic flexure of the colon. The patient's CT Chest w/ contrast was to follow up on her Stage IA2, [id number], NSCLC, adenocarcinoma of the RUL for which she had the following treatment.   04/18/20-04/26/20 SBRT Treatment: The RUL target was treated to 54 Gy in 3 fractions  She has been without disease since her original treatment.  She has atrial fibrillation, diastolic CHF, a history of gastric ulcer on PPI therapy. She was not able to take my call but her husband answered and states she has not had any recent pulmonary infections or coughing, difficulty with hiccups, abdominal pain, nausea, or vomiting. She has not had any difficulty with appetite. I encouraged her husband that if she developed any abdominal or GI symptoms she needs to go to the ED, but that Dr. Leonia Reeves recommended CT Abd/Pelvis. We will proceed with this and I've ordered it stat and will check in again today with the patient.      [name], PAC

## 2020-10-05 NOTE — Telephone Encounter (Signed)
Called patient to inform that she should pick -up contrast for test on 10-06-20, pt. to go  to [facility] ED, to pickup contrast, test will be on 10-08-20 - arrival time- 8:15 am @ [facility] Radiology, pt. to be NPO- 4 hrs. prior to test, lvm for a return call

## 2020-10-07 ENCOUNTER — Other Ambulatory Visit: Payer: Self-pay | Admitting: Radiation Oncology

## 2020-10-07 NOTE — Progress Notes (Signed)
Delayed entry I spoke with the patient by phone and we discussed her CT scan results. We reviewed that the air in the wall of her bowel was prominent on her CT scan of the chest and that radiology recommends a dedicated CT abdomen/pelvis. This was ordered stat. The patient is unable to go for a scan early on Monday morning, so this will be rescheduled. She is aware to go to the ED though if she has abdominal pain.   She has been having a lot of evening time coughing. She reports she was treated a few weeks ago with Doxycycline for a sinus infection but at the time of her lungs were clear. She has not had recent fevers, but has had frequent coughing spells at night when sitting on the couch or laying down. Her pulmonary symptoms are likely the source of her CT findings in the bowel. Since she' still having trouble with a productive cough, I think it's reasonable to retreat her and given her PCN allergy, I'd like to send in a new rx for Levaquin but given her amiodarone, this is contraindicated. She is aware of the need to go to the ED if she develops abdominal pain or a fever, or shortness of breath. I will call her pharmacy on Monday morning to determine best approach for antibiotic coverage.

## 2020-10-08 ENCOUNTER — Telehealth: Payer: Self-pay

## 2020-10-08 ENCOUNTER — Ambulatory Visit: Admission: RE | Admit: 2020-10-08 | Payer: Medicare Other | Source: Ambulatory Visit | Admitting: Radiation Oncology

## 2020-10-08 ENCOUNTER — Encounter: Payer: Self-pay | Admitting: Radiation Oncology

## 2020-10-08 ENCOUNTER — Ambulatory Visit (HOSPITAL_COMMUNITY)
Admission: RE | Admit: 2020-10-08 | Discharge: 2020-10-08 | Disposition: A | Discharge status: Home / Self Care | Payer: Medicare Other | Source: Ambulatory Visit | Attending: Radiation Oncology | Admitting: Radiation Oncology

## 2020-10-08 ENCOUNTER — Other Ambulatory Visit: Payer: Self-pay

## 2020-10-08 ENCOUNTER — Other Ambulatory Visit: Payer: Self-pay | Admitting: Radiation Oncology

## 2020-10-08 DIAGNOSIS — D259 Leiomyoma of uterus, unspecified: Secondary | ICD-10-CM | POA: Diagnosis not present

## 2020-10-08 DIAGNOSIS — Z85118 Personal history of other malignant neoplasm of bronchus and lung: Secondary | ICD-10-CM | POA: Diagnosis not present

## 2020-10-08 DIAGNOSIS — C3411 Malignant neoplasm of upper lobe, right bronchus or lung: Secondary | ICD-10-CM

## 2020-10-08 DIAGNOSIS — K6389 Other specified diseases of intestine: Secondary | ICD-10-CM | POA: Diagnosis not present

## 2020-10-08 DIAGNOSIS — R059 Cough, unspecified: Secondary | ICD-10-CM | POA: Diagnosis not present

## 2020-10-08 MED ORDER — DOXYCYCLINE HYCLATE 100 MG PO TABS: 100.0000 mg | ORAL_TABLET | Freq: Two times a day (BID) | ORAL | 0 refills | Status: DC

## 2020-10-08 MED ORDER — IOHEXOL 9 MG/ML PO SOLN
500.0000 mL | ORAL | Status: AC
  Administered 2020-10-08: 1000 mL via ORAL

## 2020-10-08 MED ORDER — SODIUM CHLORIDE (PF) 0.9 % IJ SOLN
INTRAMUSCULAR | Status: AC
  Filled 2020-10-08: qty 50

## 2020-10-08 MED ORDER — IOHEXOL 9 MG/ML PO SOLN
ORAL | Status: AC
  Filled 2020-10-08: qty 1000

## 2020-10-08 MED ORDER — IOHEXOL 300 MG/ML  SOLN
75.0000 mL | Freq: Once | INTRAMUSCULAR | Status: AC | PRN
  Administered 2020-10-08: 75 mL via INTRAVENOUS

## 2020-10-08 NOTE — Progress Notes (Signed)
I contacted the patient after her CT scan and left her voicemail.  I encouraged her to continue plans for doxycycline which had been tabled earlier in the day during conversation.  She had a left lower lobe nodule that was described as being inflammatory in nature, we will follow-up with her next CT scan of the chest in 6 months.  Regarding the findings of pneumatosis of the bowel, I will reach out again to Dr. Rush Landmark however this appears to be reassuring and most likely related to her recent cough.  When I spoke with her earlier, she stated that she was not having frank abdominal pain but stated that she does have some discomfort in the upper abdomen with indigestion.

## 2020-10-08 NOTE — Telephone Encounter (Signed)
Left patient a voicemail about antibiotic treatment. Phone number left for her to call me if she has any questions.

## 2020-10-08 NOTE — Progress Notes (Signed)
After speaking with her pharmacist [facility] and reviewing her drug interactions with her ongoing amiodarone, and penicillin-based allergies, it was felt best by myself and the pharmacist to retreat her with doxycycline, a new prescription was sent back into the pharmacy for a 10-day course.  Nursing will notify the patient as well.

## 2020-10-08 NOTE — Telephone Encounter (Signed)
Appt made for 10/18/20 at 230 pm with GM.  The pt has been advised.

## 2020-10-09 ENCOUNTER — Inpatient Hospital Stay: Payer: Medicare Other | Attending: Nurse Practitioner | Admitting: Nurse Practitioner

## 2020-10-09 ENCOUNTER — Inpatient Hospital Stay
Admission: RE | Admit: 2020-10-09 | Discharge: 2020-10-09 | Disposition: A | Discharge status: Home / Self Care | Payer: Medicare Other | Source: Ambulatory Visit | Attending: Radiation Oncology | Admitting: Radiation Oncology

## 2020-10-09 ENCOUNTER — Encounter: Payer: Self-pay | Admitting: Nurse Practitioner

## 2020-10-09 ENCOUNTER — Telehealth: Payer: Self-pay | Admitting: Nurse Practitioner

## 2020-10-09 VITALS — BP 166/68 | HR 62 | Temp 98.1°F | Resp 16 | Ht 65.0 in | Wt 159.1 lb

## 2020-10-09 DIAGNOSIS — Z853 Personal history of malignant neoplasm of breast: Secondary | ICD-10-CM | POA: Diagnosis not present

## 2020-10-09 DIAGNOSIS — Z7901 Long term (current) use of anticoagulants: Secondary | ICD-10-CM | POA: Insufficient documentation

## 2020-10-09 DIAGNOSIS — I10 Essential (primary) hypertension: Secondary | ICD-10-CM | POA: Diagnosis not present

## 2020-10-09 DIAGNOSIS — M8580 Other specified disorders of bone density and structure, unspecified site: Secondary | ICD-10-CM | POA: Insufficient documentation

## 2020-10-09 DIAGNOSIS — Z923 Personal history of irradiation: Secondary | ICD-10-CM | POA: Diagnosis not present

## 2020-10-09 DIAGNOSIS — Z8249 Family history of ischemic heart disease and other diseases of the circulatory system: Secondary | ICD-10-CM | POA: Insufficient documentation

## 2020-10-09 DIAGNOSIS — Z803 Family history of malignant neoplasm of breast: Secondary | ICD-10-CM | POA: Insufficient documentation

## 2020-10-09 DIAGNOSIS — E7800 Pure hypercholesterolemia, unspecified: Secondary | ICD-10-CM | POA: Insufficient documentation

## 2020-10-09 DIAGNOSIS — Z79899 Other long term (current) drug therapy: Secondary | ICD-10-CM | POA: Insufficient documentation

## 2020-10-09 DIAGNOSIS — I4891 Unspecified atrial fibrillation: Secondary | ICD-10-CM | POA: Insufficient documentation

## 2020-10-09 DIAGNOSIS — C3411 Malignant neoplasm of upper lobe, right bronchus or lung: Secondary | ICD-10-CM | POA: Diagnosis not present

## 2020-10-09 DIAGNOSIS — C3490 Malignant neoplasm of unspecified part of unspecified bronchus or lung: Secondary | ICD-10-CM

## 2020-10-09 DIAGNOSIS — Z85118 Personal history of other malignant neoplasm of bronchus and lung: Secondary | ICD-10-CM | POA: Insufficient documentation

## 2020-10-09 NOTE — Patient Instructions (Signed)
Begin taking tums at night with dinner, this may improve the cough you experience at night when you recline. You may also try over the counter reflux medication if Tums is not strong enough, such as OTC prilosec.

## 2020-10-09 NOTE — Telephone Encounter (Signed)
Scheduled appointment per 06/14 sch msg. Patient is aware. 

## 2020-10-09 NOTE — Progress Notes (Signed)
CLINIC:  Long Term Survivorship   REASON FOR VISIT:  F/up history of breast cancer.  BRIEF ONCOLOGIC HISTORY:  Oncology History  History of right breast cancer  02/16/2004 Initial Diagnosis   Right breast mass biopsy: Invasive mammary cancer, MRI revealed 1.3 cm mass upper inner quadrant right breast    03/05/2004 Surgery   Right lumpectomy: 0.9 cm grade 1 invasive lobular cancer, ER positive, PR negative, HER-2 negative, Ki-67 5%, sentinel lymph nodes negative    04/08/2004 - 05/09/2004 Radiation Therapy   Adjuvant radiation therapy    06/10/2004 - 06/09/2009 Anti-estrogen oral therapy   MA-27 trial and placed on the Aromasin arm.  She completed one year of Aromasin, and it was discontinued due to severe joint aches and pains, followed by Tamoxifen for 2-3 years, and then Letrozole to complete out a total of 5 years      INTERVAL HISTORY:  [name] presents to the [facility] today as scheduled. She was seen by me last year 10/04/2019. She was diagnosed with stage I adeno/NSCLC RUL in 02/2020 treated with SRS by Dr. Lisbeth Renshaw from 04/18/2020 - 04/26/2020, she received 54 Gy in 3 fractions.  Follow-up CT chest 06/06/2020 showed possible inflammatory findings versus radiation pneumonitis, follow-up CT chest 10/05/2020 showed parenchymal changes in the treatment field, slightly smaller nodule, and new colonic pneumatosis. She is scheduled to f/up with GI Dr. Rush Landmark 6/23.   Today, she presents alone. She is doing well from a breast cancer standpoint, no breast complaints. She had sinusitis with cough which resolved, but she still coughs at night when she reclines. Denies fever/chills. She is going back on doxy due to recent CT chest findings. Denies abdominal or other pain, change in bowel habits (baseline constipation), bleeding, or other new concerns. Her heart is "in rhythm."   ONCOLOGY TREATMENT TEAM:  1. Surgeon: in [place] 2. Medical Oncologist: Dr. Lindi Adie  3. Radiation  Oncologist: in [facility] for breast cancer/ now follows Shona Simpson, PA for lung RT    PAST MEDICAL/SURGICAL HISTORY:  Past Medical History:  Diagnosis Date   Allergy    Anemia    Anxiety    Aortic valve stenosis, severe    s/p AVR // mild to mod paravalvular leakage // Echo 9/21: EF 55-60, no RWMA, mild LVH, GR 2 DD, mildly reduced RVSF, RVSP 38.7, moderate LAE, mild RAE, AVR with mean gradient 11 mmHg, mild paravalvular leakage   Breast cancer ([place]) 2007   right   Cataract    Constipation    Dysrhythmia    A-fib   Hiatal hernia    pt denies   Hypercholesteremia    Hypertension    Hypoglycemia    Osteopenia    Personal history of radiation therapy    Tingling    Ulcer 2012   bleeding gastric Ulcer   Past Surgical History:  Procedure Laterality Date   AORTIC VALVE REPLACEMENT N/A 11/16/2014   Procedure: AORTIC VALVE REPLACEMENT (AVR);  Surgeon: Gaye Pollack, MD;  Location: [place];  Service: Open Heart Surgery;  Laterality: N/A;   BIOPSY  12/15/2017   Procedure: BIOPSY;  Surgeon: Rush Landmark Telford Nab., MD;  Location: [place];  Service: Gastroenterology;;   BREAST BIOPSY     BREAST LUMPECTOMY Right    2005?   BREAST SURGERY     BRONCHIAL BIOPSY  03/06/2020   Procedure: BRONCHIAL BIOPSIES;  Surgeon: Collene Gobble, MD;  Location[facility] ENDOSCOPY;  Service: Pulmonary;;   BRONCHIAL BRUSHINGS  03/06/2020  Procedure: BRONCHIAL BRUSHINGS;  Surgeon: Collene Gobble, MD;  Location: [facility] ENDOSCOPY;  Service: Pulmonary;;   BRONCHIAL NEEDLE ASPIRATION BIOPSY  03/06/2020   Procedure: BRONCHIAL NEEDLE ASPIRATION BIOPSIES;  Surgeon: Collene Gobble, MD;  Location: [facility] ENDOSCOPY;  Service: Pulmonary;;   BRONCHIAL WASHINGS  03/06/2020   Procedure: BRONCHIAL WASHINGS;  Surgeon: Collene Gobble, MD;  Location: [facility] ENDOSCOPY;  Service: Pulmonary;;   CARDIAC CATHETERIZATION N/A 10/18/2014   Procedure: Right/Left Heart Cath and Coronary Angiography;  Surgeon: [name], MD;  Location: [place] CV LAB;  Service: Cardiovascular;  Laterality: N/A;   CARDIOVERSION N/A 12/09/2019   Procedure: CARDIOVERSION;  Surgeon: Josue Hector, MD;  Location: [place];  Service: Cardiovascular;  Laterality: N/A;   CARDIOVERSION N/A 12/30/2019   Procedure: CARDIOVERSION;  Surgeon: Josue Hector, MD;  Location: [facility] ENDOSCOPY;  Service: Cardiovascular;  Laterality: N/A;   CORONARY ANGIOGRAPHY N/A 02/04/2019   Procedure: CORONARY ANGIOGRAPHY;  Surgeon: Nelva Bush, MD;  Location: [place] CV LAB;  Service: Cardiovascular;  Laterality: N/A;   COSMETIC SURGERY     face   ESOPHAGOGASTRODUODENOSCOPY (EGD) WITH PROPOFOL N/A 12/15/2017   Procedure: ESOPHAGOGASTRODUODENOSCOPY (EGD) WITH PROPOFOL;  Surgeon: Rush Landmark Telford Nab., MD;  Location: [place];  Service: Gastroenterology;  Laterality: N/A;   FIDUCIAL MARKER PLACEMENT  03/06/2020   Procedure: FIDUCIAL MARKER PLACEMENT;  Surgeon: Collene Gobble, MD;  Location: [facility] ENDOSCOPY;  Service: Pulmonary;;   PARATHYROIDECTOMY     TEE WITHOUT CARDIOVERSION N/A 11/16/2014   Procedure: TRANSESOPHAGEAL ECHOCARDIOGRAM (TEE);  Surgeon: Gaye Pollack, MD;  Location: [place];  Service: Open Heart Surgery;  Laterality: N/A;   TEE WITHOUT CARDIOVERSION N/A 12/09/2019   Procedure: TRANSESOPHAGEAL ECHOCARDIOGRAM (TEE);  Surgeon: Josue Hector, MD;  Location: [facility] ENDOSCOPY;  Service: Cardiovascular;  Laterality: N/A;   TONSILLECTOMY     VIDEO BRONCHOSCOPY WITH ENDOBRONCHIAL NAVIGATION Right 03/06/2020   Procedure: VIDEO BRONCHOSCOPY WITH ENDOBRONCHIAL NAVIGATION;  Surgeon: Collene Gobble, MD;  Location: [facility] ENDOSCOPY;  Service: Pulmonary;  Laterality: Right;     ALLERGIES:  Allergies  Allergen Reactions   Lactose Intolerance (Gi) Other (See Comments)    GI upset   Amoxicillin-Pot Clavulanate Hives and Rash   Penicillins Hives and Rash    30 years   Sulfa Antibiotics Hives and Rash   Sulfonamide Derivatives Hives and Rash     CURRENT  MEDICATIONS:  Outpatient Encounter Medications as of 10/09/2020  Medication Sig   acetaminophen (TYLENOL) 500 MG tablet Take 500-1,000 mg by mouth every 6 (six) hours as needed for mild pain or moderate pain.    ALPRAZolam (XANAX) 0.5 MG tablet TAKE 1 TABLET ONCE DAILY AS NEEDED FOR AN ANXIETY, DO NOT TAKE AT NIGHT.   amiodarone (PACERONE) 200 MG tablet Take 0.5 tablets (100 mg total) by mouth daily.   apixaban (ELIQUIS) 5 MG TABS tablet Take 1 tablet (5 mg total) by mouth 2 (two) times daily.   Calcium Carb-Cholecalciferol 600-400 MG-UNIT TABS Take 1 tablet by mouth daily.   Chloral Hydrate CRYS Take 10 mLs by mouth at bedtime as needed (Call  liquid into [place]).   Cholecalciferol (VITAMIN D-3) 1000 UNITS CAPS Take 1,000 Units by mouth daily.    clindamycin (CLEOCIN) 300 MG capsule Take 600 mg by mouth See admin instructions. Take 600 mg 1 hour prior to dental work   doxycycline (VIBRA-TABS) 100 MG tablet Take 1 tablet (100 mg total) by mouth 2 (two) times daily.   ferrous sulfate  325 (65 FE) MG tablet Take 325 mg by mouth every Monday, Wednesday, and Friday.   furosemide (LASIX) 20 MG tablet Take 1 tablet (20 mg total) by mouth daily. You may take 1 extra tablet daily for weight gain of 3 pounds.   HYDROcodone-homatropine (HYDROMET) 5-1.5 MG/5ML syrup Take 5 mLs by mouth every 6 (six) hours as needed for cough.   ipratropium (ATROVENT) 0.06 % nasal spray SMARTSIG:2 Spray(s) Both Nares 3 Times Daily PRN   levocetirizine (XYZAL) 5 MG tablet Take 5 mg by mouth at bedtime as needed for allergies.    montelukast (SINGULAIR) 10 MG tablet TAKE 1 TABLET EACH DAY.   nitroGLYCERIN (NITROSTAT) 0.4 MG SL tablet Place 1 tablet (0.4 mg total) under the tongue every 5 (five) minutes as needed for chest pain (CP or SOB).   Ophthalmic Irrigation Solution (OCUSOFT EYE [place] OP) Apply 1 application to eye at bedtime.   pantoprazole (PROTONIX) 40 MG tablet TAKE 1 TABLET BY MOUTH TWICE DAILY.    polyethylene glycol (MIRALAX / GLYCOLAX) 17 g packet Take 17 g by mouth every other day.   potassium chloride (KLOR-CON) 10 MEQ tablet Take 1 tablet (10 mEq total) by mouth daily.   rosuvastatin (CRESTOR) 20 MG tablet TAKE 1 TABLET ONCE DAILY.   tretinoin (RETIN-A) 0.1 % cream Apply 1 application topically every Monday, Wednesday, and Friday.    Wheat Dextrin (BENEFIBER DRINK MIX PO) Take 1 Dose by mouth every other day.   [DISCONTINUED] fluticasone (FLONASE) 50 MCG/ACT nasal spray Place 2 sprays into both nostrils daily as needed for allergies.   No facility-administered encounter medications on file as of 10/09/2020.     ONCOLOGIC FAMILY HISTORY:  Family History  Problem Relation Age of Onset   Hypertension Mother    Rectal cancer Mother    Colon cancer Mother    Kidney disease Father    Cancer Cousin 19       breast and ovarian    Esophageal cancer Neg Hx    Stomach cancer Neg Hx    Colon polyps Neg Hx      GENETIC COUNSELING/TESTING: Per Pt BRCA negative  SOCIAL HISTORY:  [name] is married and lives with her spouse at [facility] in [place], [place].  She has 3 daughters.     PHYSICAL EXAMINATION:  Vital Signs:   Vitals:   10/09/20 0947  BP: (!) 166/68  Pulse: 62  Resp: 16  Temp: 98.1 F (36.7 C)  SpO2: 97%   Filed Weights   10/09/20 0947  Weight: 159 lb 1.6 oz (72.2 kg)   General: Well-nourished, well-appearing female in no acute distress.   HEENT: Sclerae anicteric.  Lymph: No cervical, supraclavicular, or infraclavicular lymphadenopathy noted on palpation.  Cardiovascular: Regular rate and rhythm. Respiratory: Clear to auscultation bilaterally. breathing non-labored.  GI: Abdomen soft and round; non-tender, non-distended. Bowel sounds normoactive.  Neuro: No focal deficits. Steady gait.  Psych: Mood and affect normal and appropriate for situation.  Extremities: No edema. MSK: No focal spinal tenderness to palpation.  Full range  of motion in bilateral upper extremities Skin: Warm and dry. Breast exam: Breasts are symmetric without nipple discharge or inversion.  S/p right lumpectomy and radiation.  Incision completely healed, minimal scar tissue.  No palpable mass in either breast or axilla that I could appreciate.  LABORATORY DATA:  None for this visit.  DIAGNOSTIC IMAGING:  None for this visit.      ASSESSMENT AND PLAN:  [name] is a  pleasant 87 y.o. female with history of stage IA right breast invasive lobular carcinoma, ER+/PR+/HER2-, diagnosed in 2005, treated in [place] with lumpectomy, adjuvant radiation therapy, and anti-estrogen therapy x5 years completed in 2011.  She presents to the [facility] for surveillance and long-term follow-up.   1.  History of lobular breast cancer:  [name] is clinically doing well.  Breast exam is benign, CBC and CMP 06/2020 are stable.  Bilateral screening mammogram from 05/30/2020 is negative.  Overall there is no clinical concern for breast cancer recurrence.  She prefers to continue long-term follow-up in our survivorship clinic.  She will return in 09/2021.  She knows to contact the office sooner if she develops new concerns or questions.  2.  Right lung adenocarcinoma/NSCLC, [id number], stage I (02/2020) -She was not felt to be a good surgical candidate, Dr. Lamonte Sakai referred for RT -S/p SBRT per Dr. Lisbeth Renshaw and Shona Simpson, PA -CT 09/2020 shows treatment response  -Retreating with doxy for possibly inflammatory/infectious finding on CT -Consider Tums and/or prilosec for positional pm cough, ? GERD  3. Colon pneumatosis -seen on f/up CT chest 09/2020 for treated R lung cancer -felt to be benign, possibly from recent resp infection and coughing.  -F/up Dr. Rush Landmark 10/18/20  4. Bone health: Last DEXA 12/20/2018 showed osteopenia with high FRAX score, managed by PCP Dr. Quay Burow.  She takes daily calcium and vitamin D and walks frequently at her residence.   Eligible to repeat 11/2020 by PCP.  5. Cancer screening:  Due to Ms. Kerstetter's history and her age, she should use shared decision making with her providers before undergoing cancer screenings for colon and GYN malignancies.  Continue annual skin check and routine mammograms.  She has family history of breast and ovarian cancer, patient is reportedly BRCA negative via genetic testing over 15 years ago at an independent lab.  I do not have access to the report. Given her new lung cancer diagnosis, pt may be eligible for repeat testing. I will ask genetics to reach out.   6. Health maintenance and wellness promotion: Ms. Kochanowski was encouraged to consume 5-7 servings of fruits and vegetables per day. We reviewed the "Nutrition Rainbow" handout, as well as the handout "Take Control of Your Health and Reduce Your Cancer Risk" from the [place].  She was also encouraged to engage in moderate to vigorous exercise for 30 minutes per day most days of the week. We discussed the [employer] fitness program, which is designed for cancer survivors to help them become more physically fit after cancer treatments.  She was instructed to limit her alcohol consumption and continue to abstain from tobacco use. She is up to date on all (4) covid-19 vaccines.    7. Support services/counseling: no indication for SW referral at this time.    Dispo:   -Return to cancer center for long term survivorship in 1 year -Mammogram due in 05/2021 -Follow up with PCP, GI, RT, and GYN if needed -Refer to genetics to discuss indication for repeat/additional testing given new lung cancer 2021, h/o breast cancer, and family history  Orders Placed This Encounter  Procedures   Ambulatory referral to Genetics    Referral Priority:   Routine    Referral Type:   Consultation    Referral Reason:   Specialty Services Required    Number of Visits Requested:   1     A total of (40) minutes of face-to-face time was  spent with this patient with  greater than 50% of that time in counseling and care-coordination.   Cira Rue, NP Survivorship Program [facility] [phone]   Note: PRIMARY CARE PROVIDER [name], [place] [phone]

## 2020-10-11 MED ORDER — FUROSEMIDE 20 MG PO TABS: 10.0000 mg | ORAL_TABLET | Freq: Every day | ORAL | 3 refills | Status: DC

## 2020-10-18 ENCOUNTER — Ambulatory Visit (INDEPENDENT_AMBULATORY_CARE_PROVIDER_SITE_OTHER): Payer: Medicare Other | Admitting: Gastroenterology

## 2020-10-18 ENCOUNTER — Telehealth: Payer: Self-pay

## 2020-10-18 ENCOUNTER — Encounter: Payer: Self-pay | Admitting: Gastroenterology

## 2020-10-18 ENCOUNTER — Other Ambulatory Visit: Payer: Self-pay | Admitting: Internal Medicine

## 2020-10-18 ENCOUNTER — Encounter: Payer: Medicare Other | Admitting: Genetic Counselor

## 2020-10-18 VITALS — BP 120/60 | HR 62 | Ht 65.0 in | Wt 159.0 lb

## 2020-10-18 DIAGNOSIS — K6389 Other specified diseases of intestine: Secondary | ICD-10-CM | POA: Diagnosis not present

## 2020-10-18 DIAGNOSIS — Z1211 Encounter for screening for malignant neoplasm of colon: Secondary | ICD-10-CM

## 2020-10-18 DIAGNOSIS — K279 Peptic ulcer, site unspecified, unspecified as acute or chronic, without hemorrhage or perforation: Secondary | ICD-10-CM

## 2020-10-18 DIAGNOSIS — K257 Chronic gastric ulcer without hemorrhage or perforation: Secondary | ICD-10-CM | POA: Diagnosis not present

## 2020-10-18 DIAGNOSIS — Z800 Family history of malignant neoplasm of digestive organs: Secondary | ICD-10-CM

## 2020-10-18 DIAGNOSIS — Z8711 Personal history of peptic ulcer disease: Secondary | ICD-10-CM

## 2020-10-18 DIAGNOSIS — R935 Abnormal findings on diagnostic imaging of other abdominal regions, including retroperitoneum: Secondary | ICD-10-CM | POA: Diagnosis not present

## 2020-10-18 DIAGNOSIS — E611 Iron deficiency: Secondary | ICD-10-CM

## 2020-10-18 MED ORDER — SUCRALFATE 1 GM/10ML PO SUSP: 1.0000 g | Freq: Four times a day (QID) | ORAL | 1 refills | Status: DC

## 2020-10-18 NOTE — Progress Notes (Signed)
[place] VISIT   Primary Care Provider [name], MD [place] [address] [phone]  Patient Profile: [name] is a 87 y.o. female with a pmh significant for Chronic Gastric Ulceration, prior breast cancer, hypertension, hyperlipidemia, status post AVR (bioprosthetic) with mild valvular leak, osteopenia, anxiety, lung cancer (status post radiation), pneumatosis cystoides intestinalis, family history colon cancer (mother).  The patient presents to the [facility] for an evaluation and management of problem(s) noted below:  Problem List 1. Pneumatosis intestinalis   2. Abnormal CT of the abdomen   3. Family history of colon cancer   4. Colon cancer screening   5. Chronic gastric ulcer without hemorrhage and without perforation      History of Present Illness Please see prior notes for full details of HPI.    Interval History The patient returns for a previously unscheduled follow-up.  Her daughter is on the phone for today's visit.  The patient underwent CT chest imaging for follow-up of her lung cancer and was found incidentally to have abnormal findings in her colon.  These were then followed up by a CT abdomen/pelvis that suggested pneumatosis.  It is for this reason that the patient returns to clinic to discuss things further.  The patient has not had any alteration in her bowel habits.  She denies any blood in her stools.  She feels a "gas" and hears it in her ears and wonders if this area that is leaking and a result of her intestinal findings.  The patient is otherwise doing well.  She had a recent abdominal upset as a result of significant stress while playing bridge.  But other than this she has been doing well and eating well and not having any weight loss unintentionally.  She and family are strongly wanting further evaluation and with patient's family history of her mother dying from colon  cancer in her 15s with the patient living towards that goal, she would like to strongly undergo further colonoscopic evaluation if possible.  GI Review of Systems Positive as above Negative for dysphagia, odynophagia, nausea, vomiting, alteration of bowel habits, blood in stool   Review of Systems General: Denies fevers/chills/unintentional weight loss Cardiovascular: Denies chest pain and current palpitations Pulmonary: Denies shortness of breath Gastroenterological: See HPI Genitourinary: Denies darkened urine Hematological: Denies easy bruising Dermatological: Denies jaundice Psychological: Mood is concerned   Medications Current Outpatient Medications  Medication Sig Dispense Refill   acetaminophen (TYLENOL) 500 MG tablet Take 500-1,000 mg by mouth every 6 (six) hours as needed for mild pain or moderate pain.      ALPRAZolam (XANAX) 0.5 MG tablet TAKE 1 TABLET ONCE DAILY AS NEEDED FOR AN ANXIETY, DO NOT TAKE AT NIGHT. 90 tablet 0   amiodarone (PACERONE) 200 MG tablet Take 0.5 tablets (100 mg total) by mouth daily. 45 tablet 2   apixaban (ELIQUIS) 5 MG TABS tablet Take 1 tablet (5 mg total) by mouth 2 (two) times daily. 60 tablet 6   Calcium Carb-Cholecalciferol 600-400 MG-UNIT TABS Take 1 tablet by mouth daily.     Cholecalciferol (VITAMIN D-3) 1000 UNITS CAPS Take 1,000 Units by mouth daily.      clindamycin (CLEOCIN) 300 MG capsule Take 600 mg by mouth See admin instructions. Take 600 mg 1 hour prior to dental work     doxycycline (VIBRA-TABS) 100 MG tablet Take 1 tablet (100 mg total) by mouth 2 (two) times daily. 20 tablet 0  ferrous sulfate 325 (65 FE) MG tablet Take 325 mg by mouth. Every Monday and Wednesday     furosemide (LASIX) 20 MG tablet Take 0.5 tablets (10 mg total) by mouth daily. You may take 1 extra tablet daily for weight gain of 3 pounds. 45 tablet 3   HYDROcodone-homatropine (HYDROMET) 5-1.5 MG/5ML syrup Take 5 mLs by mouth every 6 (six) hours as needed for  cough. 120 mL 0   ipratropium (ATROVENT) 0.06 % nasal spray SMARTSIG:2 Spray(s) Both Nares 3 Times Daily PRN     levocetirizine (XYZAL) 5 MG tablet Take 5 mg by mouth at bedtime as needed for allergies.      montelukast (SINGULAIR) 10 MG tablet TAKE 1 TABLET EACH DAY. 90 tablet 1   nitroGLYCERIN (NITROSTAT) 0.4 MG SL tablet Place 1 tablet (0.4 mg total) under the tongue every 5 (five) minutes as needed for chest pain (CP or SOB). 25 tablet 3   Ophthalmic Irrigation Solution (OCUSOFT EYE [place] OP) Apply 1 application to eye at bedtime.     pantoprazole (PROTONIX) 40 MG tablet TAKE 1 TABLET BY MOUTH TWICE DAILY. 180 tablet 0   polyethylene glycol (MIRALAX / GLYCOLAX) 17 g packet Take 17 g by mouth every other day.     potassium chloride (KLOR-CON) 10 MEQ tablet Take 1 tablet (10 mEq total) by mouth daily. 90 tablet 3   rosuvastatin (CRESTOR) 20 MG tablet TAKE 1 TABLET ONCE DAILY. 90 tablet 3   sucralfate (CARAFATE) 1 GM/10ML suspension Take 10 mLs (1 g total) by mouth 4 (four) times daily. 420 mL 1   tretinoin (RETIN-A) 0.1 % cream Apply 1 application topically every Monday, Wednesday, and Friday.      Wheat Dextrin (BENEFIBER DRINK MIX PO) Take 1 Dose by mouth every other day.     Chloral Hydrate CRYS Take 10 mls by mouth at bedtime as needed. 900 g 5   No current facility-administered medications for this visit.    Allergies Allergies  Allergen Reactions   Lactose Intolerance (Gi) Other (See Comments)    GI upset   Amoxicillin-Pot Clavulanate Hives and Rash   Penicillins Hives and Rash    30 years   Sulfa Antibiotics Hives and Rash   Sulfonamide Derivatives Hives and Rash    Histories Past Medical History:  Diagnosis Date   Allergy    Anemia    Anxiety    Aortic valve stenosis, severe    s/p AVR // mild to mod paravalvular leakage // Echo 9/21: EF 55-60, no RWMA, mild LVH, GR 2 DD, mildly reduced RVSF, RVSP 38.7, moderate LAE, mild RAE, AVR with mean gradient 11 mmHg, mild  paravalvular leakage   Breast cancer ([place]) 2007   right   Cataract    Constipation    Dysrhythmia    A-fib   Hiatal hernia    pt denies   Hypercholesteremia    Hypertension    Hypoglycemia    Osteopenia    Personal history of radiation therapy    Tingling    Ulcer 2012   bleeding gastric Ulcer   Past Surgical History:  Procedure Laterality Date   AORTIC VALVE REPLACEMENT N/A 11/16/2014   Procedure: AORTIC VALVE REPLACEMENT (AVR);  Surgeon: Gaye Pollack, MD;  Location: [place];  Service: Open Heart Surgery;  Laterality: N/A;   BIOPSY  12/15/2017   Procedure: BIOPSY;  Surgeon: Rush Landmark Telford Nab., MD;  Location: [place];  Service: Gastroenterology;;   BREAST BIOPSY     BREAST  LUMPECTOMY Right    2005?   BREAST SURGERY     BRONCHIAL BIOPSY  03/06/2020   Procedure: BRONCHIAL BIOPSIES;  Surgeon: Collene Gobble, MD;  Location[facility] ENDOSCOPY;  Service: Pulmonary;;   BRONCHIAL BRUSHINGS  03/06/2020   Procedure: BRONCHIAL BRUSHINGS;  Surgeon: Collene Gobble, MD;  Location: [facility] ENDOSCOPY;  Service: Pulmonary;;   BRONCHIAL NEEDLE ASPIRATION BIOPSY  03/06/2020   Procedure: BRONCHIAL NEEDLE ASPIRATION BIOPSIES;  Surgeon: Collene Gobble, MD;  Location: [facility] ENDOSCOPY;  Service: Pulmonary;;   BRONCHIAL WASHINGS  03/06/2020   Procedure: BRONCHIAL WASHINGS;  Surgeon: Collene Gobble, MD;  Location: [facility] ENDOSCOPY;  Service: Pulmonary;;   CARDIAC CATHETERIZATION N/A 10/18/2014   Procedure: Right/Left Heart Cath and Coronary Angiography;  Surgeon: [name], MD;  Location: [place] CV LAB;  Service: Cardiovascular;  Laterality: N/A;   CARDIOVERSION N/A 12/09/2019   Procedure: CARDIOVERSION;  Surgeon: Josue Hector, MD;  Location: [place];  Service: Cardiovascular;  Laterality: N/A;   CARDIOVERSION N/A 12/30/2019   Procedure: CARDIOVERSION;  Surgeon: Josue Hector, MD;  Location: [facility] ENDOSCOPY;  Service: Cardiovascular;  Laterality: N/A;   CORONARY ANGIOGRAPHY N/A 02/04/2019    Procedure: CORONARY ANGIOGRAPHY;  Surgeon: Nelva Bush, MD;  Location: [place] CV LAB;  Service: Cardiovascular;  Laterality: N/A;   COSMETIC SURGERY     face   ESOPHAGOGASTRODUODENOSCOPY (EGD) WITH PROPOFOL N/A 12/15/2017   Procedure: ESOPHAGOGASTRODUODENOSCOPY (EGD) WITH PROPOFOL;  Surgeon: Rush Landmark Telford Nab., MD;  Location: [place];  Service: Gastroenterology;  Laterality: N/A;   FIDUCIAL MARKER PLACEMENT  03/06/2020   Procedure: FIDUCIAL MARKER PLACEMENT;  Surgeon: Collene Gobble, MD;  Location: [facility] ENDOSCOPY;  Service: Pulmonary;;   PARATHYROIDECTOMY     TEE WITHOUT CARDIOVERSION N/A 11/16/2014   Procedure: TRANSESOPHAGEAL ECHOCARDIOGRAM (TEE);  Surgeon: Gaye Pollack, MD;  Location: [place];  Service: Open Heart Surgery;  Laterality: N/A;   TEE WITHOUT CARDIOVERSION N/A 12/09/2019   Procedure: TRANSESOPHAGEAL ECHOCARDIOGRAM (TEE);  Surgeon: Josue Hector, MD;  Location: [facility] ENDOSCOPY;  Service: Cardiovascular;  Laterality: N/A;   TONSILLECTOMY     VIDEO BRONCHOSCOPY WITH ENDOBRONCHIAL NAVIGATION Right 03/06/2020   Procedure: VIDEO BRONCHOSCOPY WITH ENDOBRONCHIAL NAVIGATION;  Surgeon: Collene Gobble, MD;  Location: [facility] ENDOSCOPY;  Service: Pulmonary;  Laterality: Right;   Social History   Socioeconomic History   Marital status: Married    Spouse name: Not on file   Number of children: 3   Years of education: 16   Highest education level: Bachelor's degree (e.g., BA, AB, BS)  Occupational History   Occupation: Retired  Tobacco Use   Smoking status: Former    Packs/day: 0.50    Years: 15.00    Pack years: 7.50    Types: Cigarettes    Start date: 1971    Quit date: 1986    Years since quitting: 36.5   Smokeless tobacco: Never   Tobacco comments:    quit smoking 1980  Vaping Use   Vaping Use: Never used  Substance and Sexual Activity   Alcohol use: Yes    Alcohol/week: 7.0 standard drinks    Types: 7 Glasses of wine per week    Comment: wine nightly with  dinner   Drug use: No   Sexual activity: Not Currently  Other Topics Concern   Not on file  Social History Narrative   ** Merged History Encounter **       Lives at [facility] with her husband. Right-handed. Caffeine use: 3-4 cups  per day (some tea, mixes coffee with decaf).   Social Determinants of Health   Financial Resource Strain: Not on file  Food Insecurity: Not on file  Transportation Needs: Not on file  Physical Activity: Not on file  Stress: Not on file  Social Connections: Not on file  Intimate Partner Violence: Not At Risk   Fear of Current or Ex-Partner: No   Emotionally Abused: No   Physically Abused: No   Sexually Abused: No   Family History  Problem Relation Age of Onset   Hypertension Mother    Rectal cancer Mother    Colon cancer Mother    Kidney disease Father    Cancer Cousin 106       breast and ovarian    Esophageal cancer Neg Hx    Stomach cancer Neg Hx    Colon polyps Neg Hx    I have reviewed her medical, social, and family history in detail and updated the electronic medical record as necessary.    PHYSICAL EXAMINATION  BP 120/60   Pulse 62   Ht 5\' 5"  (1.651 m)   Wt 159 lb (72.1 kg)   BMI 26.46 kg/m  Wt Readings from Last 3 Encounters:  10/18/20 159 lb (72.1 kg)  10/09/20 159 lb 1.6 oz (72.2 kg)  10/04/20 160 lb 3.2 oz (72.7 kg)  GEN: NAD, appears stated age, doesn't appear chronically ill PSYCH: Cooperative, without pressured speech EYE: Conjunctivae pink, sclerae anicteric ENT: MMM CV: Nontachycardic RESP: No audible wheezing GI: NABS, soft, NT/ND, without rebound or guarding MSK/EXT: No significant lower extremity edema SKIN: No jaundice NEURO:  Alert & Oriented x 3, no focal deficits   REVIEW OF DATA  I reviewed the following data at the time of this encounter:  GI Procedures and Studies  2009 colonoscopy by Dr. Henrene Pastor Incidental pneumatosis intestinalis cystoides  Laboratory Studies  Reviewed in epic  Imaging  Studies  June 2022 CT abdomen pelvis IMPRESSION: 1. Prominent colonic pneumatosis throughout the splenic flexure of the colon, unchanged from 10/05/2020 chest CT, new since 06/06/2020 chest CT. Otherwise unremarkable colon with no additional sites of colonic pneumatosis. No pneumoperitoneum. No evidence of mesenteric or portal venous gas. No evidence of acute vascular abnormality. Findings favor benign colonic pneumatosis, potentially related to the patient's recent coughing episodes. Any need for follow-up CT imaging of the abdomen and pelvis should be based on clinical assessment. 2. No findings of metastatic disease in the abdomen or pelvis. 3. Indistinct subsolid 1.5 cm anterior left lower lobe pulmonary nodule, unchanged from 10/05/2020 chest CT, new from 06/06/2020 chest CT, potentially inflammatory. Suggest attention on follow-up chest CT in 3-6 months. 4. Aortic Atherosclerosis (ICD10-I70.0).   ASSESSMENT  Ms. Pendell is a 87 y.o. female with a pmh significant for Chronic Gastric Ulceration, prior breast cancer, hypertension, hyperlipidemia, status post AVR (bioprosthetic) with mild valvular leak, osteopenia, anxiety, lung cancer (status post radiation), pneumatosis cystoides intestinalis, family history colon cancer (mother). The patient is seen today for evaluation and management of:  1. Pneumatosis intestinalis   2. Abnormal CT of the abdomen   3. Family history of colon cancer   4. Colon cancer screening   5. Chronic gastric ulcer without hemorrhage and without perforation    The patient is hemodynamically stable.  The findings on her CT scan were initially concerning but after further review of the patient's prior colonoscopic evaluation, her last 1 being 13 years ago, she had pneumatosis cystoides intestinalis noted on that colonoscopy by my  partner Dr. Henrene Pastor.  Thus, the overt concern for an underlying mass or lesion that could be leading the patient to have intermittent  pneumatosis is felt to be less likely.  With that being said, the caveat is, that she has not had a colonoscopy for 13 years.  Her mother was diagnosed with colon cancer in her young 45s and this particular patient is heading towards hopefully another 5 to 79 years of life expectancy.  The patient is interested in colonoscopic evaluation.  From a diagnostic perspective and screening perspective, I think it is reasonable to consider a potential colonoscopy though it is a higher risk procedure overall.  With her having no overt anemia or iron deficiency at this time, we are unlikely to find a mass or lesion and her cross-sectional CT scan did not suggest that.  I would have to have further consideration and discussions with the patient's cardiologist and PCP as to whether they feel she remains a candidate for elective years and also training for holding anticoagulation if that were to be the case.  I will forward my note and questions to Dr. Quay Burow and Dr. Burt Knack and we will finalize a decision.  If we all feel that she can undergo anesthesia, then we would plan for a colonoscopy in the hospital-based outpatient setting to minimize risks as much as possible.  The patient and daughter understand that as we get older, procedures and anesthesia become increased risk but she has done well with anesthesia for EGDs within the last few years as I have perform them safely in the endoscopy center and in the hospital.  The risks and benefits of endoscopic evaluation were discussed with the patient; these include but are not limited to the risk of perforation, infection, bleeding, missed lesions, lack of diagnosis, severe illness requiring hospitalization, as well as anesthesia and sedation related illnesses.  The patient and/or family is agreeable to proceed if it is felt that she remains a candidate for anesthesia and anticoagulation hold.  I am going to send in a short course of Carafate to see if that will help with her  discomfort that she has had from her recent stressors.  If pain becomes significant or worsens point of severity then may require repeat cross-sectional imaging (unlikely).  All patient questions were answered to the best of my ability, and the patient agrees to the aforementioned plan of action with follow-up as indicated.   PLAN  Continue PPI twice daily and hopefully decrease soon Short course of Carafate twice daily Discussed with PCP/cardiology about candidacy for continued anesthesia and procedures Will consider colonoscopy for follow-up of pneumatosis and for high rescreening If not a candidate for procedures then will consider other means of evaluation of the colon via Cologuard (not usually done for patients who are considered higher risk) versus virtual colonoscopy at some point in the future    No orders of the defined types were placed in this encounter.   New Prescriptions   SUCRALFATE (CARAFATE) 1 GM/10ML SUSPENSION    Take 10 mLs (1 g total) by mouth 4 (four) times daily.   Modified Medications   Modified Medication Previous Medication   CHLORAL HYDRATE CRYS Chloral Hydrate CRYS      Take 10 mls by mouth at bedtime as needed.    Take 10 mLs by mouth at bedtime as needed (Call  liquid into [facility]).    Planned Follow Up: No follow-ups on file.   Total Time in Face-to-Face  and in Coordination of Care for patient including independent/personal interpretation/review of prior testing, medical history, examination, medication adjustment, communicating results with the patient directly, and documentation with the EHR is 25 minutes.      Justice Britain, MD [facility] Advanced Endoscopy Office # [phone]

## 2020-10-18 NOTE — Patient Instructions (Addendum)
We will send request to Dr. Burt Knack asking for clearance for you to proceed with endoscopies as well as cardiac clearance. Dr.Mansouraty will be in contact with you regarding this.   We have sent the following medications to your pharmacy for you to pick up at your convenience: Carafate  If you are age 85 or older, your body mass index should be between 23-30. Your Body mass index is 26.46 kg/m. If this is out of the aforementioned range listed, please consider follow up with your Primary Care Provider.  If you are age 62 or younger, your body mass index should be between 19-25. Your Body mass index is 26.46 kg/m. If this is out of the aformentioned range listed, please consider follow up with your Primary Care Provider.   __________________________________________________________  The [facility] providers would like to encourage you to use [facility] to communicate with providers for non-urgent requests or questions.  Due to long hold times on the telephone, sending your provider a message by [facility] may be a faster and more efficient way to get a response.  Please allow 48 business hours for a response.  Please remember that this is for non-urgent requests.   Thank you for choosing me and [facility].  Dr. Rush Landmark

## 2020-10-19 NOTE — Telephone Encounter (Signed)
See previous message

## 2020-10-20 ENCOUNTER — Encounter: Payer: Self-pay | Admitting: Gastroenterology

## 2020-10-20 DIAGNOSIS — Z1211 Encounter for screening for malignant neoplasm of colon: Secondary | ICD-10-CM | POA: Insufficient documentation

## 2020-10-20 DIAGNOSIS — R935 Abnormal findings on diagnostic imaging of other abdominal regions, including retroperitoneum: Secondary | ICD-10-CM | POA: Insufficient documentation

## 2020-10-20 DIAGNOSIS — Z800 Family history of malignant neoplasm of digestive organs: Secondary | ICD-10-CM | POA: Insufficient documentation

## 2020-10-20 DIAGNOSIS — K6389 Other specified diseases of intestine: Secondary | ICD-10-CM | POA: Insufficient documentation

## 2020-10-22 NOTE — Telephone Encounter (Addendum)
Good Morning [name], Sorry about that, see below. Thank you.   Request for surgical clearance:     Endoscopy Procedure  What type of surgery is being performed?     Colonoscopy  When is this surgery scheduled?     TBD  What type of clearance is required ? Pharmacy/Cardiac Clearance   Are there any medications that need to be held prior to surgery and how long? Eliquis x2 days/ Please also advise on cardiac clearance.   Practice name and name of physician performing surgery?      [facility]  What is your office phone and fax number?      Phone- [phone]  Fax564 [phone]  Anesthesia type (None, local, MAC, general) ?       MAC

## 2020-10-24 ENCOUNTER — Other Ambulatory Visit: Payer: Self-pay | Admitting: Internal Medicine

## 2020-10-24 DIAGNOSIS — L570 Actinic keratosis: Secondary | ICD-10-CM | POA: Diagnosis not present

## 2020-10-24 DIAGNOSIS — Z85828 Personal history of other malignant neoplasm of skin: Secondary | ICD-10-CM | POA: Diagnosis not present

## 2020-10-24 DIAGNOSIS — L738 Other specified follicular disorders: Secondary | ICD-10-CM | POA: Diagnosis not present

## 2020-10-24 MED ORDER — CHLORAL HYDRATE CRYS: CRYSTALS | 5 refills | Status: DC

## 2020-10-24 NOTE — Telephone Encounter (Signed)
Dr. Burt Knack We have been asked for medical clearance for a colonoscopy. Upon further questioning, Mrs. [name] would like your input on whether she should have the procedure since you asked her to avoid all elective procedures.  She is having no symptoms, no blood in stool, but has a family history of colorectal cancer in her mother diagnosed at age 85. This has prompted her to consider evaluation, but will only go forward with your blessing.  She sounded short of breath on while on the phone with me, but relayed that she can walk 25-30 min daily "at a good pace" without angina.  Dr. Burt Knack - can you please provide guidance on whether she should have a colonoscopy?  Note to self: Pt asked to return call to cell phone: [phone]

## 2020-10-24 NOTE — Telephone Encounter (Signed)
Patient with diagnosis of afib on Eliquis for anticoagulation.    Procedure: colonoscopy Date of procedure: TBD  CHA2DS2-VASc Score = 6  This indicates a 9.7% annual risk of stroke. The patient's score is based upon: CHF History: Yes HTN History: Yes Diabetes History: No Stroke History: No Vascular Disease History: Yes Age Score: 2 Gender Score: 1   CrCl 9mL/min Platelet count 147K  Per office protocol, patient can hold Eliquis for 2 days prior to procedure as requested.

## 2020-10-25 NOTE — Telephone Encounter (Signed)
Thanks Angie. Yes I think that sounds appropriate especially considering her family history.

## 2020-10-25 NOTE — Telephone Encounter (Signed)
I s/w the pt and she has been advised she is cleared for her colonoscopy and has been advised ok per Dr. Burt Knack to hold Eliquis x 2 days prior to procedure. Pt assured that notes with recommendations will be faxed to Dr. Donneta Romberg office. Pt thanked me for the call and the help.

## 2020-10-25 NOTE — Telephone Encounter (Signed)
Left message for the pt to call back so that I may let her know Dr. Burt Knack has cleared her and that I may go over her instructions with her.

## 2020-10-25 NOTE — Telephone Encounter (Signed)
   Name: [name]  DOB: 1932/05/19  MRN: [id number]   Primary Cardiologist: [name], MD  Chart reviewed as part of pre-operative protocol coverage. Patient was contacted 10/25/2020 in reference to pre-operative risk assessment for pending surgery as outlined below.  [name] was last seen on 10/04/20 by Malka So PAC.  Since that day, [name] has done well. She can complete more than 4.0 METS without angina. Dr. Burt Knack recommends proceeding with evaluation.   Per our clinical pharmacist: Per office protocol, patient can hold Eliquis for 2 days prior to procedure as requested.  Therefore, based on ACC/AHA guidelines, the patient would be at acceptable risk for the planned procedure without further cardiovascular testing.   The patient was advised that if she develops new symptoms prior to surgery to contact our office to arrange for a follow-up visit, and she verbalized understanding.  I will route this recommendation to the requesting party via Epic fax function and remove from pre-op pool. Please call with questions.  Tami Lin Makinzy Cleere, PA 10/25/2020, 8:35 AM

## 2020-10-30 NOTE — Telephone Encounter (Signed)
Thank you for the update. Rovonda, Please move forward with scheduling procedure when the patient is ready with Eliquis hold. Thanks. GM

## 2020-10-31 ENCOUNTER — Telehealth: Payer: Medicare Other

## 2020-11-01 ENCOUNTER — Telehealth: Payer: Self-pay | Admitting: Gastroenterology

## 2020-11-01 NOTE — Telephone Encounter (Signed)
Left message

## 2020-11-01 NOTE — Telephone Encounter (Signed)
Patient calling wants to know date/time for scheduled colon appt at the hosp.. Plz advise  Thank you

## 2020-11-02 NOTE — Telephone Encounter (Signed)
Spoke with patient this afternoon. She was not home and would like for me to give her a call back on Monday morning or she will call me back. We spoke briefly about the possibly of 8/15 or 8/18. Will call pt back on Monday or await her return call.

## 2020-11-05 ENCOUNTER — Other Ambulatory Visit: Payer: Self-pay

## 2020-11-05 ENCOUNTER — Telehealth: Payer: Self-pay | Admitting: Gastroenterology

## 2020-11-05 ENCOUNTER — Other Ambulatory Visit: Payer: Self-pay | Admitting: Internal Medicine

## 2020-11-05 ENCOUNTER — Other Ambulatory Visit: Payer: Self-pay | Admitting: Gastroenterology

## 2020-11-05 DIAGNOSIS — K279 Peptic ulcer, site unspecified, unspecified as acute or chronic, without hemorrhage or perforation: Secondary | ICD-10-CM

## 2020-11-05 DIAGNOSIS — Z800 Family history of malignant neoplasm of digestive organs: Secondary | ICD-10-CM

## 2020-11-05 DIAGNOSIS — Z1211 Encounter for screening for malignant neoplasm of colon: Secondary | ICD-10-CM

## 2020-11-05 MED ORDER — PLENVU 140 G PO SOLR: 1.0000 | ORAL | 0 refills | Status: DC

## 2020-11-05 NOTE — Telephone Encounter (Signed)
Pt is scheduled for 12/17/20 Colon [facility] - pt knows and voiced understanding to hold Elquis x2 days prior to procedure as per Cardiology. Instructions sent to pt via mychart as well as mailed out to patient.

## 2020-11-05 NOTE — Progress Notes (Signed)
as

## 2020-11-05 NOTE — Telephone Encounter (Signed)
Patient calling to inform she wants to change appt time at [facility] to 12/10/20 or 12/17/20 at 10:30am. Pt states he husband wants to come later in the morning/10:30am either day is fine with her.. Pt states to leave a vm message b/c she will not be available to speak with you.. Plz advise thank you

## 2020-11-05 NOTE — Addendum Note (Signed)
Addended byDebbe Mounts on: 11/05/2020 11:51 AM   Modules accepted: Orders

## 2020-11-05 NOTE — Telephone Encounter (Signed)
Patient calling back stating she can not do 7:30am and is requesting for alternate time.  Can be reached at [phone].

## 2020-11-06 ENCOUNTER — Telehealth: Payer: Self-pay

## 2020-11-06 NOTE — Telephone Encounter (Signed)
Left message on voicemail that procedure was rescheduled to 12-19-2020 at 1030am per patients request.  Advised patient to return call to office to verify if the date and time scheduled would work for the patient.

## 2020-11-06 NOTE — Telephone Encounter (Signed)
Patient calling again to resch appt.. Plz advise  thanks

## 2020-11-06 NOTE — Telephone Encounter (Signed)
Patient responded in [place] and was OK with date set for 12-19-2020 at 1030.  New instructions sent to patient in [place].

## 2020-11-06 NOTE — Telephone Encounter (Signed)
Per pt I have offered her an appt for 8/24 at 1030 am via My Chart.  Will await communication from the pt.

## 2020-11-06 NOTE — Telephone Encounter (Signed)
Blalock, Zohra Johney Frame, RN This is [facility]         Appointment with Dr Rush Landmark (Newest Message First) Danila, Eddie  You 9 minutes ago (11:54 AM)      This is [employer], Nikhita Mentzel  You 10 minutes ago (11:53 AM)      Chong Sicilian,    I am not comfortable working with [employer]. She does not call me back or respond to my message in my chart. I was scheduled for a 7:30 appointment for a colonoscopy with Dr. Allison Quarry on August 27th. My husband cannot do 7:30 as he will not sleep all night. Can you get me an appointment for my surgery either on a Monday at [facility] after 9am...and preferably at 10:30. Or if he is at [facility] at another day the same time would work.      Thanks        [employer], Renatha Rosen 5 days ago      You are welcome-if you do not hear from her in a few days message me and I will work on it for you.     [employer], Nicola Heinemann  You 5 days ago      Thanks for letting me know.     You  Turano, Kyli Sorter 5 days ago      You will be getting a call from Rovonda Dr Mansouraty's CMA to set up your procedure.       Purohit, Amos Gaber  You 5 days ago      [place],     From my understanding, I need to be scheduled for a colonoscopy with Dr. Allison Quarry. They said someone would call me to schedule it. Since Dr. Allison Quarry told me last week that he is 2 months out, I think I need to schedule an appointment for the surgery in the hospital.       Am I going to be called to schedule the appointment?           Aariya Samons     Wolff, Arwyn Lasky  You 4 weeks ago      [place] I will see you then    Thanks        [employer], Shakiyla Kook 4 weeks ago      That is the only appointment until August.  Sorry we are very busy right now.     [place], Avaeh Ewer  You 4 weeks ago      Heiley Shaikh,     Yes I need to see Dr. Allison Quarry.  I did not know I had an appointment at 2:30 the 23rd. Can we make it earlier that day or the 20th, 21st or 22 in the morning?      Look forward to hearing back from you         Thanks,            [employer]     You  Hinostroza, Laketha Leopard 4 weeks ago        You have a return appointment with Dr. Rush Landmark  on 10/18/20 at 2:30 pm.  Please remember to bring a complete list of the medicines you are taking, your insurance card and your co-pay.  If you have to cancel or reschedule this appointment, please call before 5:00 pm the evening before to avoid a cancellation  fee.  If you have any questions or concerns, please call [phone].   If your insurance requires a referral to see a specialist, you as the patient will need to ensure that this is arranged by your primary care provider prior to your scheduled appointment.

## 2020-11-08 ENCOUNTER — Telehealth: Payer: Self-pay | Admitting: Pharmacist

## 2020-11-08 NOTE — Telephone Encounter (Signed)
Pt sent in MyChart message reporting Eliquis is cost prohibitive. Jeani Hawking, would you be able to reach out to her regarding patient assistance to see if she qualifies?

## 2020-11-09 NOTE — Telephone Encounter (Signed)
**Note De-Identified  Obfuscation** The pt states that her Eliquis has gone up from $45 for a 90 day supply to $158 for a 30 day supply. She states that her ins provider told her that Warfarin is the generic for Eliquis and is less expensive. I advised her that Warfarin is the generic to Coumadin and I explained the requirements while taking Warfarin vs. Eliquis (monthly or more often finger sticks to monitor the thickness of her blood, Warfarin dose adjustments, and diet). She states that she is not opposed to taking Warfarin but prefers staying on Eliquis..  We discussed pt asst through [employer] for her Eliquis and I gave her their phone number so she can call them with questions concerning their Eliquis program and her eligibility to be approved for the program. If it appears that she would be eligible she is aware to request that they mail her an application to her home and that once she receives it to complete her part, obtain required documents per [employer], and to bring all  Dr York Cerise office at [facility] on [address] in [place] and that we will take care of the provider page and will fax all to [employer].  She states that she just purchased a 30 day supply of her Eliquis and is not in need of samples at this time.  She does have my name and the office phone number to call us if she has any further questions or concerns about applying for asst. for her Eliquis.

## 2020-11-29 ENCOUNTER — Other Ambulatory Visit: Payer: Self-pay

## 2020-11-29 DIAGNOSIS — Z1211 Encounter for screening for malignant neoplasm of colon: Secondary | ICD-10-CM

## 2020-11-29 NOTE — Telephone Encounter (Signed)
Patient has decided to cancel colonoscopy and do Cologuard testing for colon cancer screening.  Not unreasonable based on her age but she understands that if it returns positive then colonoscopy would be recommended. We had already received approval from her medical and cardiology team in regards to her safety to move forward with procedure but we will keep that for any future procedures if necessary.  I will have my team work on canceling the colonoscopy and moving forward with Cologuard being ordered.  Justice Britain, MD [facility] Office # [phone]

## 2020-12-05 DIAGNOSIS — L821 Other seborrheic keratosis: Secondary | ICD-10-CM | POA: Diagnosis not present

## 2020-12-05 DIAGNOSIS — Z85828 Personal history of other malignant neoplasm of skin: Secondary | ICD-10-CM | POA: Diagnosis not present

## 2020-12-05 DIAGNOSIS — L570 Actinic keratosis: Secondary | ICD-10-CM | POA: Diagnosis not present

## 2020-12-10 DIAGNOSIS — Z1211 Encounter for screening for malignant neoplasm of colon: Secondary | ICD-10-CM | POA: Diagnosis not present

## 2020-12-14 LAB — COLOGUARD: Cologuard: POSITIVE — AB

## 2020-12-19 ENCOUNTER — Other Ambulatory Visit: Payer: Self-pay

## 2020-12-19 ENCOUNTER — Ambulatory Visit (HOSPITAL_COMMUNITY): Admit: 2020-12-19 | Payer: Medicare Other | Admitting: Gastroenterology

## 2020-12-19 ENCOUNTER — Telehealth: Payer: Self-pay

## 2020-12-19 ENCOUNTER — Encounter (HOSPITAL_COMMUNITY): Payer: Self-pay

## 2020-12-19 DIAGNOSIS — R195 Other fecal abnormalities: Secondary | ICD-10-CM

## 2020-12-19 SURGERY — COLONOSCOPY WITH PROPOFOL
Anesthesia: Monitor Anesthesia Care

## 2020-12-19 MED ORDER — PEG 3350-KCL-NA BICARB-NACL 420 G PO SOLR: 4000.0000 mL | Freq: Once | ORAL | 0 refills | Status: AC

## 2020-12-19 NOTE — Telephone Encounter (Signed)
I have notified the pt that she can hold Eliquis 2 days prior to her procedure.

## 2020-12-19 NOTE — Telephone Encounter (Signed)
    Patient Name: [name]  DOB: [birth year] MRN: [id number]  Primary Cardiologist: [name], MD  Chart reviewed as part of pre-operative protocol coverage. Given past medical history and time since last visit, based on ACC/AHA guidelines, [name] would be at acceptable risk for the planned procedure without further cardiovascular testing.    Per our clinical pharmacist: Per office protocol, patient can hold Eliquis for 2 days prior to procedure as requested.   Therefore, based on ACC/AHA guidelines, the patient would be at acceptable risk for the planned procedure without further cardiovascular testing.    The patient was advised that if she develops new symptoms prior to surgery to contact our office to arrange for a follow-up visit, and she verbalized understanding.   I will route this recommendation to the requesting party via Epic fax function and remove from pre-op pool. Please call with questions.  Please call with questions.  Kathyrn Drown, NP 12/19/2020, 2:27 PM

## 2020-12-19 NOTE — Telephone Encounter (Signed)
[facility] Pre-operative Risk Assessment     Request for surgical clearance:     Endoscopy Procedure  What type of surgery is being performed?     Colon     When is this surgery scheduled?     9/15  What type of clearance is required ?   Pharmacy  Are there any medications that need to be held prior to surgery and how long? Eliquis  Practice name and name of physician performing surgery?      [facility]  What is your office phone and fax number?      Phone- [phone]  [phone]  Anesthesia type (None, local, MAC, general) ?       MAC

## 2020-12-19 NOTE — Telephone Encounter (Signed)
Pt cleared on 10/18/20 to hold Eliquis for 2 days prior to colonoscopy scheduled for 8/22, looks like procedure was canceled and r/s. Ok to provide same clearance rec noted in last clearance.

## 2020-12-20 ENCOUNTER — Telehealth: Payer: Self-pay

## 2020-12-20 NOTE — Telephone Encounter (Signed)
The pt has been changed to 12 noon on 9/15 at [facility].  I have notified the pt via My Chart.

## 2020-12-20 NOTE — Telephone Encounter (Signed)
-----   Message from Irving Copas., MD sent at 12/20/2020  3:17 AM EDT ----- Regarding: Follow-up [name], This patient is scheduled on a Thursday where I already and in the endoscopy center with the patient's.  Please work on rescheduling her to a date where I am available. Thanks. GM

## 2020-12-29 ENCOUNTER — Telehealth: Payer: Self-pay | Admitting: Internal Medicine

## 2020-12-29 NOTE — Telephone Encounter (Signed)
LVM for pt to schedule AWV with [facility]. Please schedule appt if pt calls the office

## 2021-01-05 MED ORDER — CHLORAL HYDRATE CRYS: CRYSTALS | 0 refills | Status: DC

## 2021-01-05 NOTE — Addendum Note (Signed)
Addended by: [name] on: 01/05/2021 10:50 AM   Modules accepted: Orders

## 2021-01-08 ENCOUNTER — Encounter (HOSPITAL_COMMUNITY): Payer: Self-pay | Admitting: Gastroenterology

## 2021-01-08 ENCOUNTER — Other Ambulatory Visit: Payer: Self-pay

## 2021-01-08 NOTE — Progress Notes (Addendum)
Spoke with pt for pre-op call. Pt has hx of A-fib and is on Eliquis. Dr. Burt Knack instructed her to hold Eliquis 2 days prior to the procedure. Last dose was 01/07/21 PM dose.   Pt's surgery is scheduled as ambulatory so no Covid test is required prior to surgery.   EKG - 10/04/20 in Epic Echo - 07/17/20 in Epic Cath - 02/04/19 in [place]

## 2021-01-09 ENCOUNTER — Telehealth: Payer: Self-pay | Admitting: Gastroenterology

## 2021-01-09 NOTE — Telephone Encounter (Signed)
The pt wanted to go over the colon instructions one last time in regards to medications she can take as well as her diet.  She wants to be sure she can continue broth until 4 hours prior to colon.  We discussed at length and all questions answered.  Nothing further at this time.

## 2021-01-09 NOTE — Telephone Encounter (Signed)
Inbound call from pt requesting a call back stating she has a question regarding her procedure tomorrow. Please advise. Thank you

## 2021-01-09 NOTE — Telephone Encounter (Signed)
See phone note dated 9/14 questions answered over the phone

## 2021-01-10 ENCOUNTER — Ambulatory Visit (HOSPITAL_COMMUNITY)
Admission: RE | Admit: 2021-01-10 | Discharge: 2021-01-10 | Disposition: A | Discharge status: Home / Self Care | Payer: Medicare Other | Attending: Gastroenterology | Admitting: Gastroenterology

## 2021-01-10 ENCOUNTER — Other Ambulatory Visit: Payer: Self-pay

## 2021-01-10 ENCOUNTER — Encounter (HOSPITAL_COMMUNITY)
Admission: RE | Disposition: A | Discharge status: Home / Self Care | Payer: Self-pay | Source: Home / Self Care | Attending: Gastroenterology

## 2021-01-10 ENCOUNTER — Ambulatory Visit (HOSPITAL_COMMUNITY): Payer: Medicare Other | Admitting: Anesthesiology

## 2021-01-10 ENCOUNTER — Encounter (HOSPITAL_COMMUNITY): Payer: Self-pay | Admitting: Gastroenterology

## 2021-01-10 DIAGNOSIS — Z882 Allergy status to sulfonamides status: Secondary | ICD-10-CM | POA: Diagnosis not present

## 2021-01-10 DIAGNOSIS — Z853 Personal history of malignant neoplasm of breast: Secondary | ICD-10-CM | POA: Diagnosis not present

## 2021-01-10 DIAGNOSIS — K5140 Inflammatory polyps of colon without complications: Secondary | ICD-10-CM | POA: Diagnosis not present

## 2021-01-10 DIAGNOSIS — D509 Iron deficiency anemia, unspecified: Secondary | ICD-10-CM | POA: Diagnosis not present

## 2021-01-10 DIAGNOSIS — K5730 Diverticulosis of large intestine without perforation or abscess without bleeding: Secondary | ICD-10-CM | POA: Insufficient documentation

## 2021-01-10 DIAGNOSIS — K644 Residual hemorrhoidal skin tags: Secondary | ICD-10-CM | POA: Diagnosis not present

## 2021-01-10 DIAGNOSIS — R195 Other fecal abnormalities: Secondary | ICD-10-CM | POA: Diagnosis not present

## 2021-01-10 DIAGNOSIS — K641 Second degree hemorrhoids: Secondary | ICD-10-CM | POA: Diagnosis not present

## 2021-01-10 DIAGNOSIS — K649 Unspecified hemorrhoids: Secondary | ICD-10-CM | POA: Diagnosis not present

## 2021-01-10 DIAGNOSIS — D123 Benign neoplasm of transverse colon: Secondary | ICD-10-CM | POA: Insufficient documentation

## 2021-01-10 DIAGNOSIS — Z800 Family history of malignant neoplasm of digestive organs: Secondary | ICD-10-CM | POA: Insufficient documentation

## 2021-01-10 DIAGNOSIS — K6389 Other specified diseases of intestine: Secondary | ICD-10-CM | POA: Diagnosis not present

## 2021-01-10 DIAGNOSIS — Z87891 Personal history of nicotine dependence: Secondary | ICD-10-CM | POA: Diagnosis not present

## 2021-01-10 DIAGNOSIS — Z880 Allergy status to penicillin: Secondary | ICD-10-CM | POA: Diagnosis not present

## 2021-01-10 HISTORY — PX: BIOPSY: SHX5522

## 2021-01-10 HISTORY — DX: Unspecified osteoarthritis, unspecified site: M19.90

## 2021-01-10 HISTORY — PX: COLONOSCOPY WITH PROPOFOL: SHX5780

## 2021-01-10 SURGERY — COLONOSCOPY WITH PROPOFOL
Anesthesia: Monitor Anesthesia Care

## 2021-01-10 MED ORDER — PROPOFOL 500 MG/50ML IV EMUL
INTRAVENOUS | Status: DC | PRN
  Administered 2021-01-10: 100 ug/kg/min via INTRAVENOUS

## 2021-01-10 MED ORDER — PROPOFOL 10 MG/ML IV BOLUS
INTRAVENOUS | Status: DC | PRN
  Administered 2021-01-10: 10 mg via INTRAVENOUS
  Administered 2021-01-10: 20 mg via INTRAVENOUS

## 2021-01-10 MED ORDER — SODIUM CHLORIDE 0.9 % IV SOLN: INTRAVENOUS | Status: DC

## 2021-01-10 MED ORDER — LACTATED RINGERS IV SOLN: INTRAVENOUS | Status: DC

## 2021-01-10 SURGICAL SUPPLY — 21 items

## 2021-01-10 NOTE — Anesthesia Procedure Notes (Signed)
Procedure Name: MAC Date/Time: 01/10/2021 12:05 PM Performed by: Moshe Salisbury, CRNA Pre-anesthesia Checklist: Patient identified, Emergency Drugs available, Suction available and Patient being monitored Patient Re-evaluated:Patient Re-evaluated prior to induction Oxygen Delivery Method: Nasal cannula Placement Confirmation: positive ETCO2 Dental Injury: Teeth and Oropharynx as per pre-operative assessment

## 2021-01-10 NOTE — Transfer of Care (Signed)
Immediate Anesthesia Transfer of Care Note  Patient: [name]  Procedure(s) Performed: COLONOSCOPY WITH PROPOFOL POLYPECTOMY BIOPSY  Patient Location: PACU  Anesthesia Type:MAC  Level of Consciousness: drowsy and patient cooperative  Airway & Oxygen Therapy: Patient Spontanous Breathing and Patient connected to nasal cannula oxygen  Post-op Assessment: Report given to RN and Post -op Vital signs reviewed and stable  Post vital signs: Reviewed and stable  Last Vitals:  Vitals Value Taken Time  BP 123/101 01/10/21 1243  Temp 36.2 C 01/10/21 1243  Pulse 76 01/10/21 1244  Resp 19 01/10/21 1244  SpO2 100 % 01/10/21 1244  Vitals shown include unvalidated device data.  Last Pain:  Vitals:   01/10/21 1243  TempSrc: Temporal  PainSc: 0-No pain         Complications: No notable events documented.

## 2021-01-10 NOTE — Anesthesia Preprocedure Evaluation (Addendum)
Anesthesia Evaluation  Patient identified by MRN, date of birth, ID band Patient awake    Reviewed: Allergy & Precautions, NPO status , Patient's Chart, lab work & pertinent test results  History of Anesthesia Complications Negative for: history of anesthetic complications  Airway Mallampati: III  TM Distance: >3 FB Neck ROM: Full    Dental no notable dental hx. (+) Dental Advisory Given   Pulmonary former smoker,    Pulmonary exam normal breath sounds clear to auscultation       Cardiovascular hypertension, Pt. on medications + CAD, + Past MI, + CABG and +CHF  Normal cardiovascular exam+ dysrhythmias Atrial Fibrillation + Valvular Problems/Murmurs (s/p AVR in 2016) AS  Rhythm:Regular Rate:Normal     Neuro/Psych Anxiety    GI/Hepatic Neg liver ROS, hiatal hernia, PUD,   Endo/Other  negative endocrine ROS  Renal/GU negative Renal ROS  negative genitourinary   Musculoskeletal  (+) Arthritis , Osteoarthritis,    Abdominal   Peds  Hematology  (+) Blood dyscrasia, anemia ,   Anesthesia Other Findings   Reproductive/Obstetrics negative OB ROS                           Anesthesia Physical  Anesthesia Plan  ASA: 4  Anesthesia Plan: MAC   Post-op Pain Management:    Induction: Intravenous  PONV Risk Score and Plan: 3 and TIVA and Treatment may vary due to age or medical condition  Airway Management Planned: Natural Airway and Nasal Cannula  Additional Equipment: None  Intra-op Plan:   Post-operative Plan:   Informed Consent: I have reviewed the patients History and Physical, chart, labs and discussed the procedure including the risks, benefits and alternatives for the proposed anesthesia with the patient or authorized representative who has indicated his/her understanding and acceptance.     Dental advisory given  Plan Discussed with: Anesthesiologist and CRNA  Anesthesia Plan  Comments: (PAT note by Karoline Caldwell, PA-C: Follows with cardiology for hx of AS s/p bioprosthetic AVR 2016 (now with stable mil-mod perivalvular regurgitation), CAD s/p NSTEMI 01/2019 (total occlusion of distal LAD felt to be prob embolic event), Afib s/p TEE-DCCV 11/2019.  EKG 01/23/2020: Sinus bradycardia.  Rate 52.  Occasional PAC.  Event monitor 02/14/20: The basic rhythm is sinus bradycardia with an average HR of 49 bpm No atrial fibrillation or flutter No high-grade heart block or pathologic pauses There are rare PVC's and occasional supraventricular beats without sustained arrhythmias.  PAC burden is 2.2%.  TTE 01/20/20: 1. Left ventricular ejection fraction, by estimation, is 55 to 60%. The  left ventricle has normal function. The left ventricle has no regional  wall motion abnormalities. There is mild left ventricular hypertrophy.  Left ventricular diastolic parameters  are consistent with Grade II diastolic dysfunction (pseudonormalization).  2. Right ventricular systolic function is mildly reduced. The right  ventricular size is moderately enlarged. There is mildly elevated  pulmonary artery systolic pressure. The estimated right ventricular  systolic pressure is 99.3 mmHg.  3. Left atrial size was moderately dilated.  4. Right atrial size was mildly dilated.  5. The mitral valve is normal in structure. No evidence of mitral valve  regurgitation. No evidence of mitral stenosis.  6. Bioprosthetic aortic valve. Mean gradient 11 mmHg, not significantly  elevated. There is at least mild paravalvular leakage, this may not be  fully visualized.  7. The inferior vena cava is dilated in size with >50% respiratory  variability, suggesting right atrial  pressure of 8 mmHg.   LHC 02/04/2019: 1. Severe single-vessel coronary artery disease with occlusion of the distal LAD.  Otherwise, there is mild disease involving the proximal LAD and ostial diagonal branch.  Question if this  represents acute plaque rupture versus coronary embolism. 2. No significant CAD involving the LCx and RCA.  )        Anesthesia Quick Evaluation

## 2021-01-10 NOTE — Op Note (Signed)
[facility] Patient Name: [name] Procedure Date : 01/10/2021 MRN: [id number] Attending MD: Justice Britain , MD Date of Birth: 28-Mar-1933 CSN: [id number] Age: 87 Admit Type: Outpatient Procedure:                Colonoscopy Indications:              Positive Cologuard test, Abnormal CT of the GI tract Providers:                Justice Britain, MD, Particia Nearing, RN, Burtis Junes, RN, Cherylynn Ridges, Technician, Claybon Jabs                            CRNA, CRNA Referring MD:             [name], MD Medicines:                Monitored Anesthesia Care Complications:            No immediate complications. Estimated Blood Loss:     Estimated blood loss was minimal. Procedure:                Pre-Anesthesia Assessment:                           - Prior to the procedure, a History and Physical                            was performed, and patient medications and                            allergies were reviewed. The patient's tolerance of                            previous anesthesia was also reviewed. The risks                            and benefits of the procedure and the sedation                            options and risks were discussed with the patient.                            All questions were answered, and informed consent                            was obtained. Prior Anticoagulants: The patient has                            taken Eliquis (apixaban), last dose was 2 days                            prior to procedure. ASA Grade Assessment: III - A  patient with severe systemic disease. After                            reviewing the risks and benefits, the patient was                            deemed in satisfactory condition to undergo the                            procedure.                           After obtaining informed consent, the colonoscope                            was passed  under direct vision. Throughout the                            procedure, the patient's blood pressure, pulse, and                            oxygen saturations were monitored continuously. The                            [id number]-HQ190L ([id number] Olympus colonoscope was                            introduced through the anus and advanced to the 5                            cm into the ileum. The colonoscopy was somewhat                            difficult due to significant looping. Successful                            completion of the procedure was aided by changing                            the patient's position, using manual pressure,                            withdrawing and reinserting the scope,                            straightening and shortening the scope to obtain                            bowel loop reduction and using scope torsion. The                            patient tolerated the procedure. The quality of the  bowel preparation was adequate. The terminal ileum,                            ileocecal valve, appendiceal orifice, and rectum                            were photographed. Scope In: 12:15:53 PM Scope Out: 12:36:05 PM Scope Withdrawal Time: 0 hours 12 minutes 49 seconds  Total Procedure Duration: 0 hours 20 minutes 12 seconds  Findings:      The digital rectal exam findings include hemorrhoids. Pertinent       negatives include no palpable rectal lesions.      The terminal ileum and ileocecal valve appeared normal.      Diffuse area of pneumatosis cystoides intestinales was found in the       descending colon (36-50 cm) and it was significantly erythematous and       inflammed. Biopsies were taken with a cold forceps for histology to rule       out chronic colitis.      A few small-mouthed diverticula were found in the recto-sigmoid colon       and sigmoid colon.      Normal mucosa was found in the entire colon otherwise.       Non-bleeding non-thrombosed external and internal hemorrhoids were found       during retroflexion, during perianal exam and during digital exam. The       hemorrhoids were Grade II (internal hemorrhoids that prolapse but reduce       spontaneously). Impression:               - Hemorrhoids found on digital rectal exam.                           - The examined portion of the ileum was normal.                           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 Biopsied.                           - Diverticulosis in the recto-sigmoid colon and in                            the sigmoid colon.                           - Normal mucosa in the entire examined colon                            otherwise.                           - Non-bleeding non-thrombosed external and internal  hemorrhoids. Recommendation:           - The patient will be observed post-procedure,                            until all discharge criteria are met.                           - Discharge patient to home.                           - Patient has a contact number available for                            emergencies. The signs and symptoms of potential                            delayed complications were discussed with the                            patient. Return to normal activities tomorrow.                            Written discharge instructions were provided to the                            patient.                           - High fiber diet.                           - May restart Eliquis in 36 hours (9/17 AM) to                            decrease risk of post-interventional bleeding.                           - Continue present medications otherwise.                           - Await pathology results.                           - Suspect that Cologuard is positive  as a result of                            the inflammed area of tissue in regards to                            pneumatosis cystoides but her previously noted                            gastric ulcer could be an etiology as well, but it  has not been relooked due to patient preference.                           - No repeat colonoscopy due to age.                           - The findings and recommendations were discussed                            with the patient.                           - The findings and recommendations were discussed                            with the patient's family. Procedure Code(s):        --- Professional ---                           [phone], Colonoscopy, flexible; with biopsy, single                            or multiple Diagnosis Code(s):        --- Professional ---                           K64.1, Second degree hemorrhoids                           K51.40, Inflammatory polyps of colon without                            complications                           R19.5, Other fecal abnormalities                           K57.30, Diverticulosis of large intestine without                            perforation or abscess without bleeding                           R93.3, Abnormal findings on diagnostic imaging of                            other parts of digestive tract CPT copyright 2019 [employer]. All rights reserved. The codes documented in this report are preliminary and upon coder review may  be revised to meet current compliance requirements. Justice Britain, MD 01/10/2021 12:55:47 PM Number of Addenda: 0

## 2021-01-10 NOTE — H&P (Signed)
GASTROENTEROLOGY PROCEDURE H&P NOTE   Primary Care Physician: [name], MD  HPI: [name] is a 87 y.o. female who presents for Colonoscopy for positive Cologuard and high risk screening due to family history of colon cancer.  Past Medical History:  Diagnosis Date   Allergy    Anemia    Anxiety    Aortic valve stenosis, severe    s/p AVR // mild to mod paravalvular leakage // Echo 9/21: EF 55-60, no RWMA, mild LVH, GR 2 DD, mildly reduced RVSF, RVSP 38.7, moderate LAE, mild RAE, AVR with mean gradient 11 mmHg, mild paravalvular leakage   Arthritis    Breast cancer ([place]) 2007   right   Cataract    Constipation    Dysrhythmia    A-fib   Hiatal hernia    pt denies   Hypercholesteremia    Hypertension    Hypoglycemia    Osteopenia    Personal history of radiation therapy    Tingling    Ulcer 2012   bleeding gastric Ulcer   Past Surgical History:  Procedure Laterality Date   AORTIC VALVE REPLACEMENT N/A 11/16/2014   Procedure: AORTIC VALVE REPLACEMENT (AVR);  Surgeon: Gaye Pollack, MD;  Location: [place];  Service: Open Heart Surgery;  Laterality: N/A;   BIOPSY  12/15/2017   Procedure: BIOPSY;  Surgeon: Rush Landmark Telford Nab., MD;  Location: [place];  Service: Gastroenterology;;   BREAST BIOPSY     BREAST LUMPECTOMY Right    2005?   BREAST SURGERY     BRONCHIAL BIOPSY  03/06/2020   Procedure: BRONCHIAL BIOPSIES;  Surgeon: Collene Gobble, MD;  Location: [facility] ENDOSCOPY;  Service: Pulmonary;;   BRONCHIAL BRUSHINGS  03/06/2020   Procedure: BRONCHIAL BRUSHINGS;  Surgeon: Collene Gobble, MD;  Location: [facility] ENDOSCOPY;  Service: Pulmonary;;   BRONCHIAL NEEDLE ASPIRATION BIOPSY  03/06/2020   Procedure: BRONCHIAL NEEDLE ASPIRATION BIOPSIES;  Surgeon: Collene Gobble, MD;  Location: [facility] ENDOSCOPY;  Service: Pulmonary;;   BRONCHIAL WASHINGS  03/06/2020   Procedure: BRONCHIAL WASHINGS;  Surgeon: Collene Gobble, MD;  Location: [facility] ENDOSCOPY;  Service: Pulmonary;;    CARDIAC CATHETERIZATION N/A 10/18/2014   Procedure: Right/Left Heart Cath and Coronary Angiography;  Surgeon: [name], MD;  Location: [place] [facility];  Service: Cardiovascular;  Laterality: N/A;   CARDIOVERSION N/A 12/09/2019   Procedure: CARDIOVERSION;  Surgeon: Josue Hector, MD;  Location: [place];  Service: Cardiovascular;  Laterality: N/A;   CARDIOVERSION N/A 12/30/2019   Procedure: CARDIOVERSION;  Surgeon: Josue Hector, MD;  Location: [facility] ENDOSCOPY;  Service: Cardiovascular;  Laterality: N/A;   CORONARY ANGIOGRAPHY N/A 02/04/2019   Procedure: CORONARY ANGIOGRAPHY;  Surgeon: Nelva Bush, MD;  Location: [place] [facility];  Service: Cardiovascular;  Laterality: N/A;   COSMETIC SURGERY     face   ESOPHAGOGASTRODUODENOSCOPY (EGD) WITH PROPOFOL N/A 12/15/2017   Procedure: ESOPHAGOGASTRODUODENOSCOPY (EGD) WITH PROPOFOL;  Surgeon: Rush Landmark Telford Nab., MD;  Location: [place];  Service: Gastroenterology;  Laterality: N/A;   FIDUCIAL MARKER PLACEMENT  03/06/2020   Procedure: FIDUCIAL MARKER PLACEMENT;  Surgeon: Collene Gobble, MD;  Location: [facility] ENDOSCOPY;  Service: Pulmonary;;   PARATHYROIDECTOMY     TEE WITHOUT CARDIOVERSION N/A 11/16/2014   Procedure: TRANSESOPHAGEAL ECHOCARDIOGRAM (TEE);  Surgeon: Gaye Pollack, MD;  Location: [place];  Service: Open Heart Surgery;  Laterality: N/A;   TEE WITHOUT CARDIOVERSION N/A 12/09/2019   Procedure: TRANSESOPHAGEAL ECHOCARDIOGRAM (TEE);  Surgeon: Josue Hector, MD;  Location:  [facility] ENDOSCOPY;  Service: Cardiovascular;  Laterality: N/A;   TONSILLECTOMY     VIDEO BRONCHOSCOPY WITH ENDOBRONCHIAL NAVIGATION Right 03/06/2020   Procedure: VIDEO BRONCHOSCOPY WITH ENDOBRONCHIAL NAVIGATION;  Surgeon: Collene Gobble, MD;  Location: [place] ENDOSCOPY;  Service: Pulmonary;  Laterality: Right;   Current Facility-Administered Medications  Medication Dose Route Frequency Provider Last Rate Last Admin   0.9 %  sodium chloride infusion   Intravenous  Continuous Mansouraty, Telford Nab., MD       lactated ringers infusion   Intravenous Continuous Mansouraty, Telford Nab., MD        Current Facility-Administered Medications:    0.9 %  sodium chloride infusion, , Intravenous, Continuous, Mansouraty, Telford Nab., MD   lactated ringers infusion, , Intravenous, Continuous, Mansouraty, Telford Nab., MD Allergies  Allergen Reactions   Lactose Intolerance (Gi) Other (See Comments)    GI upset   Amoxicillin-Pot Clavulanate Hives and Rash   Penicillins Hives and Rash    30 years   Sulfa Antibiotics Hives and Rash   Sulfonamide Derivatives Hives and Rash   Family History  Problem Relation Age of Onset   Hypertension Mother    Rectal cancer Mother    Colon cancer Mother    Kidney disease Father    Cancer Cousin 52       breast and ovarian    Esophageal cancer Neg Hx    Stomach cancer Neg Hx    Colon polyps Neg Hx    Social History   Socioeconomic History   Marital status: Married    Spouse name: Not on file   Number of children: 3   Years of education: 16   Highest education level: Bachelor's degree (e.g., BA, AB, BS)  Occupational History   Occupation: Retired  Tobacco Use   Smoking status: Former    Packs/day: 0.50    Years: 15.00    Pack years: 7.50    Types: Cigarettes    Start date: 1971    Quit date: 1986    Years since quitting: 36.7   Smokeless tobacco: Never   Tobacco comments:    quit smoking 1980  Vaping Use   Vaping Use: Never used  Substance and Sexual Activity   Alcohol use: Yes    Alcohol/week: 7.0 standard drinks    Types: 7 Glasses of wine per week    Comment: wine nightly with dinner   Drug use: No   Sexual activity: Not Currently  Other Topics Concern   Not on file  Social History Narrative   ** Merged History Encounter **       Lives at [facility] with her husband. Right-handed. Caffeine use: 3-4 cups per day (some tea, mixes coffee with decaf).   Social Determinants of Health    Financial Resource Strain: Not on file  Food Insecurity: Not on file  Transportation Needs: Not on file  Physical Activity: Not on file  Stress: Not on file  Social Connections: Not on file  Intimate Partner Violence: Not At Risk   Fear of Current or Ex-Partner: No   Emotionally Abused: No   Physically Abused: No   Sexually Abused: No    Physical Exam: Vital signs in last 24 hours:     GEN: NAD EYE: Sclerae anicteric ENT: MMM CV: Non-tachycardic GI: Soft, NT/ND NEURO:  Alert & Oriented x 3  Lab Results: No results for input(s): WBC, HGB, HCT, PLT in the last 72 hours. BMET No results for input(s): NA, K, CL, CO2, GLUCOSE,  BUN, CREATININE, CALCIUM in the last 72 hours. LFT No results for input(s): PROT, ALBUMIN, AST, ALT, ALKPHOS, BILITOT, BILIDIR, IBILI in the last 72 hours. PT/INR No results for input(s): LABPROT, INR in the last 72 hours.   Impression / Plan: This is a 87 y.o.female who presents for Colonoscopy for positive Cologuard and high risk screening due to family history of colon cancer.  The risks and benefits of endoscopic evaluation/treatment were discussed with the patient and/or family; these include but are not limited to the risk of perforation, infection, bleeding, missed lesions, lack of diagnosis, severe illness requiring hospitalization, as well as anesthesia and sedation related illnesses.  The patient's history has been reviewed, patient examined, no change in status, and deemed stable for procedure.  The patient and/or family is agreeable to proceed.    Justice Britain, MD [facility] Office # [phone]

## 2021-01-10 NOTE — Anesthesia Postprocedure Evaluation (Signed)
Anesthesia Post Note  Patient: [name]  Procedure(s) Performed: COLONOSCOPY WITH PROPOFOL POLYPECTOMY BIOPSY     Patient location during evaluation: PACU Anesthesia Type: MAC Level of consciousness: awake Pain management: pain level controlled Vital Signs Assessment: post-procedure vital signs reviewed and stable Respiratory status: spontaneous breathing Cardiovascular status: stable Postop Assessment: no apparent nausea or vomiting Anesthetic complications: no   No notable events documented.  Last Vitals:  Vitals:   01/10/21 1253 01/10/21 1303  BP: 126/70 (!) 145/78  Pulse: 80 70  Resp: (!) 24 19  Temp:    SpO2: 98% 100%    Last Pain:  Vitals:   01/10/21 1303  TempSrc:   PainSc: 0-No pain                 Angelik Walls

## 2021-01-14 ENCOUNTER — Encounter: Payer: Self-pay | Admitting: Gastroenterology

## 2021-01-14 LAB — SURGICAL PATHOLOGY

## 2021-01-15 ENCOUNTER — Ambulatory Visit: Payer: Medicare Other

## 2021-01-17 ENCOUNTER — Other Ambulatory Visit: Payer: Medicare Other | Admitting: *Deleted

## 2021-01-17 ENCOUNTER — Other Ambulatory Visit: Payer: Self-pay

## 2021-01-17 DIAGNOSIS — I480 Paroxysmal atrial fibrillation: Secondary | ICD-10-CM

## 2021-01-17 LAB — HEPATIC FUNCTION PANEL
ALT: 24 IU/L (ref 0–32)
AST: 31 IU/L (ref 0–40)
Albumin: 4.6 g/dL (ref 3.6–4.6)
Alkaline Phosphatase: 107 IU/L (ref 44–121)
Bilirubin Total: 0.6 mg/dL (ref 0.0–1.2)
Bilirubin, Direct: 0.2 mg/dL (ref 0.00–0.40)
Total Protein: 6.1 g/dL (ref 6.0–8.5)

## 2021-01-17 LAB — TSH: TSH: 2.37 u[IU]/mL (ref 0.450–4.500)

## 2021-01-17 LAB — T4, FREE: Free T4: 1.48 ng/dL (ref 0.82–1.77)

## 2021-01-21 ENCOUNTER — Ambulatory Visit: Payer: Medicare Other | Admitting: Cardiovascular Disease

## 2021-01-25 ENCOUNTER — Other Ambulatory Visit: Payer: Self-pay | Admitting: Cardiovascular Disease

## 2021-01-27 ENCOUNTER — Encounter: Payer: Self-pay | Admitting: Internal Medicine

## 2021-01-28 ENCOUNTER — Other Ambulatory Visit: Payer: Self-pay | Admitting: Cardiovascular Disease

## 2021-01-28 ENCOUNTER — Ambulatory Visit: Payer: Medicare Other

## 2021-01-29 ENCOUNTER — Other Ambulatory Visit: Payer: Self-pay | Admitting: Cardiovascular Disease

## 2021-01-29 ENCOUNTER — Telehealth: Payer: Self-pay

## 2021-01-29 ENCOUNTER — Other Ambulatory Visit: Payer: Self-pay | Admitting: Internal Medicine

## 2021-01-29 ENCOUNTER — Telehealth: Payer: Self-pay | Admitting: Cardiovascular Disease

## 2021-01-29 ENCOUNTER — Encounter: Payer: Self-pay | Admitting: Internal Medicine

## 2021-01-29 MED ORDER — POTASSIUM CHLORIDE ER 10 MEQ PO TBCR: 10.0000 meq | EXTENDED_RELEASE_TABLET | Freq: Every day | ORAL | 3 refills | Status: DC

## 2021-01-29 NOTE — Telephone Encounter (Signed)
40 mins appt

## 2021-01-29 NOTE — Telephone Encounter (Signed)
Potassium was Dc/d by pharmacy tech d/t pt preference.  I called pt and she expresses that she is still taking medication and never told tech this information.  I called pharmacy to inform them that order would be placed.  Order replaced for potassium 10 mEq PO QD.

## 2021-01-29 NOTE — Telephone Encounter (Signed)
Pt c/o medication issue:  1. Name of Medication:  Potassium Chloride 10 MEQ   2. How are you currently taking this medication (dosage and times per day)? N/A  3. Are you having a reaction (difficulty breathing--STAT)? N/A  4. What is your medication issue? Pharmacy is calling due to prescription being denied all three times it was requested. She is requesting a callback to confirm the reason for denial. Please advise.

## 2021-01-31 ENCOUNTER — Ambulatory Visit: Payer: Medicare Other | Admitting: Internal Medicine

## 2021-01-31 ENCOUNTER — Encounter: Payer: Self-pay | Admitting: Internal Medicine

## 2021-02-01 ENCOUNTER — Other Ambulatory Visit: Payer: Self-pay

## 2021-02-01 ENCOUNTER — Encounter: Payer: Self-pay | Admitting: Cardiovascular Disease

## 2021-02-01 ENCOUNTER — Ambulatory Visit (INDEPENDENT_AMBULATORY_CARE_PROVIDER_SITE_OTHER): Payer: Medicare Other | Admitting: Cardiovascular Disease

## 2021-02-01 VITALS — BP 136/74 | HR 94 | Ht 65.0 in | Wt 157.8 lb

## 2021-02-01 DIAGNOSIS — T8203XD Leakage of heart valve prosthesis, subsequent encounter: Secondary | ICD-10-CM

## 2021-02-01 DIAGNOSIS — I5032 Chronic diastolic (congestive) heart failure: Secondary | ICD-10-CM | POA: Diagnosis not present

## 2021-02-01 DIAGNOSIS — I2510 Atherosclerotic heart disease of native coronary artery without angina pectoris: Secondary | ICD-10-CM

## 2021-02-01 DIAGNOSIS — I4819 Other persistent atrial fibrillation: Secondary | ICD-10-CM

## 2021-02-01 MED ORDER — METOPROLOL SUCCINATE ER 25 MG PO TB24: 25.0000 mg | ORAL_TABLET | Freq: Every day | ORAL | 3 refills | Status: DC

## 2021-02-01 NOTE — Progress Notes (Signed)
Cardiology Office Note:    Date:  02/01/2021   ID:  [name], DOB [birth year], MRN [id number]  PCP:  [name], MD   [facility] Providers Cardiologist:  [name], MD     Referring MD: [name], MD   Chief Complaint  Patient presents with   Atrial Fibrillation    History of Present Illness:    [name] is a 87 y.o. female with a hx of  Persistent atrial fibrillation  S/p TEE-DCCV 11/2019 CHA2DS2-VASc=6 (age x 2, female, CAD, CHF, HTN) >> Apixaban   Bicuspid valve, aortic stenosis S/p bioprosthetic AVR in 2016 Paravalvular leak Normal coronary arteries prior to AVR Coronary artery disease S/p NSTEMI in 01/2019 >> total occlusion of distal LAD felt to be prob embolic event Ischemic CM, EF 45-50 w ant-apical AK TEE 8/21: EF 55 Breast CA Hypertension  Hyperlipidemia  Diastolic CHF   The patient is here alone today.  Her daughter, [name], is conferenced in via telephone.  The patient feels like she is back out of rhythm again.  This is predominantly from noticing some irregularity on her pulse checks.  She otherwise is feeling fairly well.  Her mild exertional dyspnea is stable.  She is able to walk for 20 to 25 minutes when she walks for exercise.  She denies orthopnea, PND, or leg edema.  She has no chest pain or pressure.  Past Medical History:  Diagnosis Date   Allergy    Anemia    Anxiety    Aortic valve stenosis, severe    s/p AVR // mild to mod paravalvular leakage // Echo 9/21: EF 55-60, no RWMA, mild LVH, GR 2 DD, mildly reduced RVSF, RVSP 38.7, moderate LAE, mild RAE, AVR with mean gradient 11 mmHg, mild paravalvular leakage   Arthritis    Breast cancer ([place]) 2007   right   Cataract    Constipation    Dysrhythmia    A-fib   Hiatal hernia    pt denies   Hypercholesteremia    Hypertension    Hypoglycemia    Osteopenia    Personal history of radiation therapy    Tingling    Ulcer 2012   bleeding gastric Ulcer     Past Surgical History:  Procedure Laterality Date   AORTIC VALVE REPLACEMENT N/A 11/16/2014   Procedure: AORTIC VALVE REPLACEMENT (AVR);  Surgeon: Gaye Pollack, MD;  Location: [place];  Service: Open Heart Surgery;  Laterality: N/A;   BIOPSY  12/15/2017   Procedure: BIOPSY;  Surgeon: Rush Landmark Telford Nab., MD;  Location: [place];  Service: Gastroenterology;;   BIOPSY  01/10/2021   Procedure: BIOPSY;  Surgeon: Irving Copas., MD;  Location: [place];  Service: Gastroenterology;;   BREAST BIOPSY     BREAST LUMPECTOMY Right    2005?   BREAST SURGERY     BRONCHIAL BIOPSY  03/06/2020   Procedure: BRONCHIAL BIOPSIES;  Surgeon: Collene Gobble, MD;  Location: [facility] ENDOSCOPY;  Service: Pulmonary;;   BRONCHIAL BRUSHINGS  03/06/2020   Procedure: BRONCHIAL BRUSHINGS;  Surgeon: Collene Gobble, MD;  Location: [facility] ENDOSCOPY;  Service: Pulmonary;;   BRONCHIAL NEEDLE ASPIRATION BIOPSY  03/06/2020   Procedure: BRONCHIAL NEEDLE ASPIRATION BIOPSIES;  Surgeon: Collene Gobble, MD;  Location: [place];  Service: Pulmonary;;   BRONCHIAL WASHINGS  03/06/2020   Procedure: BRONCHIAL WASHINGS;  Surgeon: Collene Gobble, MD;  Location: [facility] ENDOSCOPY;  Service: Pulmonary;;   CARDIAC CATHETERIZATION N/A 10/18/2014   Procedure:  Right/Left Heart Cath and Coronary Angiography;  Surgeon: [name], MD;  Location: [place] CV LAB;  Service: Cardiovascular;  Laterality: N/A;   CARDIOVERSION N/A 12/09/2019   Procedure: CARDIOVERSION;  Surgeon: Josue Hector, MD;  Location: [place];  Service: Cardiovascular;  Laterality: N/A;   CARDIOVERSION N/A 12/30/2019   Procedure: CARDIOVERSION;  Surgeon: Josue Hector, MD;  Location: [facility] ENDOSCOPY;  Service: Cardiovascular;  Laterality: N/A;   COLONOSCOPY WITH PROPOFOL N/A 01/10/2021   Procedure: COLONOSCOPY WITH PROPOFOL;  Surgeon: Irving Copas., MD;  Location: [place];  Service: Gastroenterology;  Laterality: N/A;   CORONARY ANGIOGRAPHY  N/A 02/04/2019   Procedure: CORONARY ANGIOGRAPHY;  Surgeon: Nelva Bush, MD;  Location: [place] CV LAB;  Service: Cardiovascular;  Laterality: N/A;   COSMETIC SURGERY     face   ESOPHAGOGASTRODUODENOSCOPY (EGD) WITH PROPOFOL N/A 12/15/2017   Procedure: ESOPHAGOGASTRODUODENOSCOPY (EGD) WITH PROPOFOL;  Surgeon: Rush Landmark Telford Nab., MD;  Location: [place];  Service: Gastroenterology;  Laterality: N/A;   FIDUCIAL MARKER PLACEMENT  03/06/2020   Procedure: FIDUCIAL MARKER PLACEMENT;  Surgeon: Collene Gobble, MD;  Location: [facility] ENDOSCOPY;  Service: Pulmonary;;   PARATHYROIDECTOMY     TEE WITHOUT CARDIOVERSION N/A 11/16/2014   Procedure: TRANSESOPHAGEAL ECHOCARDIOGRAM (TEE);  Surgeon: Gaye Pollack, MD;  Location: [place];  Service: Open Heart Surgery;  Laterality: N/A;   TEE WITHOUT CARDIOVERSION N/A 12/09/2019   Procedure: TRANSESOPHAGEAL ECHOCARDIOGRAM (TEE);  Surgeon: Josue Hector, MD;  Location: [facility] ENDOSCOPY;  Service: Cardiovascular;  Laterality: N/A;   TONSILLECTOMY     VIDEO BRONCHOSCOPY WITH ENDOBRONCHIAL NAVIGATION Right 03/06/2020   Procedure: VIDEO BRONCHOSCOPY WITH ENDOBRONCHIAL NAVIGATION;  Surgeon: Collene Gobble, MD;  Location: [facility] ENDOSCOPY;  Service: Pulmonary;  Laterality: Right;    Current Medications: Current Meds  Medication Sig   acetaminophen (TYLENOL) 500 MG tablet Take 500-1,000 mg by mouth every 6 (six) hours as needed for mild pain or moderate pain.    ALPRAZolam (XANAX) 0.5 MG tablet TAKE 1 TABLET ONCE DAILY AS NEEDED FOR AN ANXIETY, DO NOT TAKE AT NIGHT.   apixaban (ELIQUIS) 5 MG TABS tablet Take 1 tablet (5 mg total) by mouth 2 (two) times daily.   Calcium Carb-Cholecalciferol 600-400 MG-UNIT TABS Take 1 tablet by mouth daily.   Chloral Hydrate CRYS Liquid 100 mg/ ml.    Take 10 mls PO HS prn   Cholecalciferol (VITAMIN D-3) 1000 UNITS CAPS Take 1,000 Units by mouth daily.    clindamycin (CLEOCIN) 300 MG capsule Take 600 mg by mouth See admin  instructions. Take 600 mg 1 hour prior to dental work   doxycycline (VIBRA-TABS) 100 MG tablet Take 1 tablet (100 mg total) by mouth 2 (two) times daily.   ferrous sulfate 325 (65 FE) MG tablet Take 325 mg by mouth 2 (two) times a week. Every Monday and Wednesday   furosemide (LASIX) 20 MG tablet Take 0.5 tablets (10 mg total) by mouth daily. You may take 1 extra tablet daily for weight gain of 3 pounds.   ipratropium (ATROVENT) 0.06 % nasal spray Place 2 sprays into both nostrils in the morning and at bedtime.   levocetirizine (XYZAL) 5 MG tablet Take 5 mg by mouth at bedtime as needed for allergies.    metoprolol succinate (TOPROL XL) 25 MG 24 hr tablet Take 1 tablet (25 mg total) by mouth daily.   montelukast (SINGULAIR) 10 MG tablet TAKE 1 TABLET EACH DAY.   nitroGLYCERIN (NITROSTAT) 0.4 MG SL tablet Place 1  tablet (0.4 mg total) under the tongue every 5 (five) minutes as needed for chest pain (CP or SOB).   Ophthalmic Irrigation Solution (OCUSOFT EYE [place] OP) Place 1 application into both eyes at bedtime.   OVER THE COUNTER MEDICATION Apply 1 application topically daily as needed (pain). Hemp Cream   pantoprazole (PROTONIX) 40 MG tablet TAKE 1 TABLET BY MOUTH TWICE DAILY.   Polyethyl Glycol-Propyl Glycol (SYSTANE ULTRA) 0.4-0.3 % SOLN Place 1 drop into both eyes daily as needed (dry eyes).   polyethylene glycol (MIRALAX / GLYCOLAX) 17 g packet Take 17 g by mouth daily.   potassium chloride (KLOR-CON) 10 MEQ tablet TAKE 1 TABLET BY MOUTH DAILY.   rosuvastatin (CRESTOR) 20 MG tablet TAKE 1 TABLET ONCE DAILY.   tretinoin (RETIN-A) 0.1 % cream Apply 1 application topically every Monday, Wednesday, and Friday.    [DISCONTINUED] amiodarone (PACERONE) 200 MG tablet Take 0.5 tablets (100 mg total) by mouth daily.     Allergies:   Lactose intolerance (gi), Amoxicillin-pot clavulanate, Penicillins, Sulfa antibiotics, and Sulfonamide derivatives   Social History   Socioeconomic History   Marital  status: Married    Spouse name: Not on file   Number of children: 3   Years of education: 16   Highest education level: Bachelor's degree (e.g., BA, AB, BS)  Occupational History   Occupation: Retired  Tobacco Use   Smoking status: Former    Packs/day: 0.50    Years: 15.00    Pack years: 7.50    Types: Cigarettes    Start date: 1971    Quit date: 1986    Years since quitting: 36.7   Smokeless tobacco: Never   Tobacco comments:    quit smoking 1980  Vaping Use   Vaping Use: Never used  Substance and Sexual Activity   Alcohol use: Yes    Alcohol/week: 7.0 standard drinks    Types: 7 Glasses of wine per week    Comment: wine nightly with dinner   Drug use: No   Sexual activity: Not Currently  Other Topics Concern   Not on file  Social History Narrative   ** Merged History Encounter **       Lives at [facility] with her husband. Right-handed. Caffeine use: 3-4 cups per day (some tea, mixes coffee with decaf).   Social Determinants of Health   Financial Resource Strain: Not on file  Food Insecurity: Not on file  Transportation Needs: Not on file  Physical Activity: Not on file  Stress: Not on file  Social Connections: Not on file     Family History: The patient's family history includes Cancer (age of onset: 83) in her cousin; Colon cancer in her mother; Hypertension in her mother; Kidney disease in her father; Rectal cancer in her mother. There is no history of Esophageal cancer, Stomach cancer, or Colon polyps.  ROS:   Please see the history of present illness.    All other systems reviewed and are negative.  EKGs/Labs/Other Studies Reviewed:    The following studies were reviewed today: Echo 07/17/2020: IMPRESSIONS     1. Left ventricular ejection fraction, by estimation, is 70 to 75%. The  left ventricle has hyperdynamic function. The left ventricle has no  regional wall motion abnormalities. Left ventricular diastolic parameters  are indeterminate.    2. Right ventricular systolic function is mildly reduced. The right  ventricular size is normal. There is mildly elevated pulmonary artery  systolic pressure.   3. Right atrial size was  moderately dilated.   4. The mitral valve is normal in structure. No evidence of mitral valve  regurgitation.   5. The aortic valve has been repaired/replaced. Aortic valve  regurgitation is mild to moderate. There is a 21 mm Edwards bioprosthetic  valve present in the aortic position. Procedure Date: 11/16/14.   Comparison(s): 01/20/20 EF 55-60%. AV 89mmHg mean PG, 73mmHg peak PG.   EKG:  EKG is ordered today.  The ekg ordered today demonstrates atrial fibrillation 75 bpm, otherwise normal.  Recent Labs: 07/13/2020: BUN 16; Hemoglobin 13.1; Platelets 147.0; Potassium 4.5; Pro B Natriuretic peptide (BNP) 196.0; Sodium 136 10/05/2020: Creatinine, Ser 0.90 01/17/2021: ALT 24; TSH 2.370  Recent Lipid Panel    Component Value Date/Time   CHOL 177 07/13/2020 1158   TRIG 82.0 07/13/2020 1158   HDL 83.50 07/13/2020 1158   CHOLHDL 2 07/13/2020 1158   VLDL 16.4 07/13/2020 1158   LDLCALC 77 07/13/2020 1158   LDLDIRECT 144.0 11/29/2012 1601     Risk Assessment/Calculations:    CHA2DS2-VASc Score = 6   This indicates a 9.7% annual risk of stroke. The patient's score is based upon: CHF History: 1 HTN History: 1 Diabetes History: 0 Stroke History: 0 Vascular Disease History: 1 Age Score: 2 Gender Score: 1          Physical Exam:    VS:  BP 136/74   Pulse 94   Ht 5\' 5"  (1.651 m)   Wt 157 lb 12.8 oz (71.6 kg)   SpO2 96%   BMI 26.26 kg/m     Wt Readings from Last 3 Encounters:  02/01/21 157 lb 12.8 oz (71.6 kg)  01/10/21 158 lb (71.7 kg)  10/18/20 159 lb (72.1 kg)     GEN:  Well nourished, well developed in no acute distress HEENT: Normal NECK: No JVD; No carotid bruits LYMPHATICS: No lymphadenopathy CARDIAC: Irregularly irregular, 2/6 diastolic decrescendo murmur at the left lower  sternal border RESPIRATORY:  Clear to auscultation without rales, wheezing or rhonchi  ABDOMEN: Soft, non-tender, non-distended MUSCULOSKELETAL:  No edema; No deformity  SKIN: Warm and dry NEUROLOGIC:  Alert and oriented x 3 PSYCHIATRIC:  Normal affect   ASSESSMENT:    1. Persistent atrial fibrillation ([place])   2. Chronic diastolic heart failure ([place])   3. Coronary artery disease involving native coronary artery of native heart without angina pectoris   4. Paravalvular leak of prosthetic heart valve, subsequent encounter    PLAN:    In order of problems listed above:  The patient is back in atrial fibrillation today.  I think it is clear that she is not going to maintain sinus rhythm as she is on low-dose amiodarone and has undergone cardioversions in the past.  She is not very symptomatic at all.  I think it is best to manage her with a rate control strategy.  We will discontinue amiodarone and keep her anticoagulated with apixaban.  We will add metoprolol succinate 25 mg daily to her medical regimen for rate control since amiodarone has been discontinued.  This plan is discussed with the patient and her daughter, both of whom are in agreement. Stable volume status.  Reviewed recent labs with preserved kidney function and electrolytes.  Advised she could take an additional 20 mg of furosemide daily as needed for weight gain. No angina at present.  Continue current medical therapy. Most recent echocardiogram reviewed.  Mild paravalvular regurgitation is audible on her physical examination.  We will plan to continue with surveillance echo  studies as indicated.  Medication Adjustments/Labs and Tests Ordered: Current medicines are reviewed at length with the patient today.  Concerns regarding medicines are outlined above.  Orders Placed This Encounter  Procedures   EKG 12-Lead   Meds ordered this encounter  Medications   metoprolol succinate (TOPROL XL) 25 MG 24 hr tablet    Sig: Take 1  tablet (25 mg total) by mouth daily.    Dispense:  90 tablet    Refill:  3    D/c Amiodarone    Patient Instructions  Medication Instructions:  1) DISCONTINUE Amiodarone  2) START Metoprolol Succinate 25mg  once daily  *If you need a refill on your cardiac medications before your next appointment, please call your pharmacy*   Lab Work: None If you have labs (blood work) drawn today and your tests are completely normal, you will receive your results only by: [place] (if you have MyChart) OR A paper copy in the mail If you have any lab test that is abnormal or we need to change your treatment, we will call you to review the results.   Testing/Procedures: None   Follow-Up: At [facility]: [facility], you and your health needs are our priority.  As part of our continuing mission to provide you with exceptional heart care, we have created designated Provider Care Teams.  These Care Teams include your primary Cardiologist (physician) and Advanced Practice Providers (APPs -  Physician Assistants and Nurse Practitioners) who all work together to provide you with the care you need, when you need it.  We recommend signing up for the patient portal called "MyChart".  Sign up information is provided on this After Visit Summary.  MyChart is used to connect with patients for Virtual Visits (Telemedicine).  Patients are able to view lab/test results, encounter notes, upcoming appointments, etc.  Non-urgent messages can be sent to your provider as well.   To learn more about what you can do with MyChart, go to [email or web address].    Your next appointment:   6 month(s)  The format for your next appointment:   In Person  Provider:   You may see [name], MD or one of the following Advanced Practice Providers on your designated Care Team:   Richardson Dopp, PA-C Robbie Lis, [place]   Other Instructions     Signed, [name], MD  02/01/2021 5:43 PM    [place]

## 2021-02-01 NOTE — Patient Instructions (Signed)
Medication Instructions:  1) DISCONTINUE Amiodarone  2) START Metoprolol Succinate 25mg  once daily  *If you need a refill on your cardiac medications before your next appointment, please call your pharmacy*   Lab Work: None If you have labs (blood work) drawn today and your tests are completely normal, you will receive your results only by: [place] (if you have MyChart) OR A paper copy in the mail If you have any lab test that is abnormal or we need to change your treatment, we will call you to review the results.   Testing/Procedures: None   Follow-Up: At [facility], you and your health needs are our priority.  As part of our continuing mission to provide you with exceptional heart care, we have created designated Provider Care Teams.  These Care Teams include your primary Cardiologist (physician) and Advanced Practice Providers (APPs -  Physician Assistants and Nurse Practitioners) who all work together to provide you with the care you need, when you need it.  We recommend signing up for the patient portal called "MyChart".  Sign up information is provided on this After Visit Summary.  MyChart is used to connect with patients for Virtual Visits (Telemedicine).  Patients are able to view lab/test results, encounter notes, upcoming appointments, etc.  Non-urgent messages can be sent to your provider as well.   To learn more about what you can do with MyChart, go to [email or web address].    Your next appointment:   6 month(s)  The format for your next appointment:   In Person  Provider:   You may see [name], MD or one of the following Advanced Practice Providers on your designated Care Team:   Richardson Dopp, PA-C Robbie Lis, [place]   Other Instructions

## 2021-02-06 ENCOUNTER — Encounter: Payer: Self-pay | Admitting: Internal Medicine

## 2021-02-06 NOTE — Progress Notes (Signed)
Subjective:    Patient ID: [name], female    DOB: 1933/01/18, 87 y.o.   MRN: [id number]  This visit occurred during the SARS-CoV-2 public health emergency.  Safety protocols were in place, including screening questions prior to the visit, additional usage of staff PPE, and extensive cleaning of exam room while observing appropriate contact time as indicated for disinfecting solutions.     HPI The patient is here for follow up of their chronic medical problems, including htn, Afib, CAD, hld, prediabetes, anxiety, insomnia, h/o PUD, B12 def  Still in afib.  Has some fluid overload now and will double her lasix  She has increased stress due to her husband's medical issues.   Medications and allergies reviewed with patient and updated if appropriate.  Patient Active Problem List   Diagnosis Date Noted   Colon cancer screening 10/20/2020   Family history of colon cancer 10/20/2020   Abnormal CT of the abdomen 10/20/2020   Pneumatosis intestinalis 10/20/2020   Secondary hypercoagulable state ([place]) 10/04/2020   URI (upper respiratory infection) 09/12/2020   Acute on chronic congestive heart failure with left ventricular diastolic dysfunction ([place]) 07/13/2020   Aortic atherosclerosis ([place]) 01/06/2020   Cough 12/22/2019   Palpitations 12/07/2019   Atrial fibrillation ([place]) 12/07/2019   PUD (peptic ulcer disease) 12/07/2019   Dyspnea on exertion 12/07/2019   Eczema 07/27/2019   CAD (coronary artery disease) 06/23/2019   Epistaxis 04/19/2019   NSTEMI (non-ST elevated myocardial infarction) ([place])    Iron deficiency 12/26/2018   Anemia 12/26/2018   B12 deficiency 12/23/2018   Discoloration of skin of finger 11/11/2018   Chronic gastric ulcer without hemorrhage and without perforation 06/05/2018   Constipation, chronic 06/05/2018   Callus of foot 03/04/2018   Hammertoes of both feet 03/04/2018   History of peptic ulcer disease 12/21/2017   Hypertension 12/21/2017    Syncope 12/14/2017   Macrocytic anemia 12/14/2017   Near syncope 12/14/2017   Left sided numbness 10/19/2017   Small vessel disease, cerebrovascular 10/19/2017   Numbness and tingling of left arm and leg 10/13/2017   Anxiety 10/02/2017   Macular pucker, right eye 07/24/2017   Pseudophakia of both eyes 07/24/2017   Prediabetes 09/29/2016   Malignant neoplasm of upper lobe of right lung (HCC) 09/29/2016   Leg cramps 01/29/2016   Insomnia 09/12/2015   Rectal bleed 09/12/2015   S/P AVR 11/16/2014   Branch retinal vein occlusion 10/12/2013   Iris nevus, right 10/12/2013   Aortic valve stenosis, severe    Allergic rhinitis 09/10/2009   Hyperlipidemia 11/30/2006   Mitral valve disease 10/29/2006   Osteopenia 10/29/2006   History of right breast cancer 10/29/2006    Current Outpatient Medications on File Prior to Visit  Medication Sig Dispense Refill   acetaminophen (TYLENOL) 500 MG tablet Take 500-1,000 mg by mouth every 6 (six) hours as needed for mild pain or moderate pain.      ALPRAZolam (XANAX) 0.5 MG tablet TAKE 1 TABLET ONCE DAILY AS NEEDED FOR AN ANXIETY, DO NOT TAKE AT NIGHT. 90 tablet 0   Calcium Carb-Cholecalciferol 600-400 MG-UNIT TABS Take 1 tablet by mouth daily.     Chloral Hydrate CRYS Liquid 100 mg/ ml.    Take 10 mls PO HS prn 900 g 0   Cholecalciferol (VITAMIN D-3) 1000 UNITS CAPS Take 1,000 Units by mouth daily.      clindamycin (CLEOCIN) 300 MG capsule Take 600 mg by mouth See admin instructions. Take 600 mg  1 hour prior to dental work     ferrous sulfate 325 (65 FE) MG tablet Take 325 mg by mouth 2 (two) times a week. Every Monday and Wednesday     furosemide (LASIX) 20 MG tablet Take 0.5 tablets (10 mg total) by mouth daily. You may take 1 extra tablet daily for weight gain of 3 pounds. 45 tablet 3   ipratropium (ATROVENT) 0.06 % nasal spray Place 2 sprays into both nostrils in the morning and at bedtime.     levocetirizine (XYZAL) 5 MG tablet Take 5 mg by mouth  at bedtime as needed for allergies.      metoprolol succinate (TOPROL XL) 25 MG 24 hr tablet Take 1 tablet (25 mg total) by mouth daily. 90 tablet 3   montelukast (SINGULAIR) 10 MG tablet TAKE 1 TABLET EACH DAY. 90 tablet 1   nitroGLYCERIN (NITROSTAT) 0.4 MG SL tablet Place 1 tablet (0.4 mg total) under the tongue every 5 (five) minutes as needed for chest pain (CP or SOB). 25 tablet 3   Ophthalmic Irrigation Solution (OCUSOFT EYE [place] OP) Place 1 application into both eyes at bedtime.     OVER THE COUNTER MEDICATION Apply 1 application topically daily as needed (pain). Hemp Cream     pantoprazole (PROTONIX) 40 MG tablet TAKE 1 TABLET BY MOUTH TWICE DAILY. 180 tablet 3   Polyethyl Glycol-Propyl Glycol (SYSTANE ULTRA) 0.4-0.3 % SOLN Place 1 drop into both eyes daily as needed (dry eyes).     polyethylene glycol (MIRALAX / GLYCOLAX) 17 g packet Take 17 g by mouth daily.     potassium chloride (KLOR-CON) 10 MEQ tablet TAKE 1 TABLET BY MOUTH DAILY. 90 tablet 3   rosuvastatin (CRESTOR) 20 MG tablet TAKE 1 TABLET ONCE DAILY. 90 tablet 3   tretinoin (RETIN-A) 0.1 % cream Apply 1 application topically every Monday, Wednesday, and Friday.      No current facility-administered medications on file prior to visit.    Past Medical History:  Diagnosis Date   Allergy    Anemia    Anxiety    Aortic valve stenosis, severe    s/p AVR // mild to mod paravalvular leakage // Echo 9/21: EF 55-60, no RWMA, mild LVH, GR 2 DD, mildly reduced RVSF, RVSP 38.7, moderate LAE, mild RAE, AVR with mean gradient 11 mmHg, mild paravalvular leakage   Arthritis    Breast cancer ([place]) 2007   right   Cataract    Constipation    Dysrhythmia    A-fib   Hiatal hernia    pt denies   Hypercholesteremia    Hypertension    Hypoglycemia    Osteopenia    Personal history of radiation therapy    Tingling    Ulcer 2012   bleeding gastric Ulcer    Past Surgical History:  Procedure Laterality Date   AORTIC VALVE  REPLACEMENT N/A 11/16/2014   Procedure: AORTIC VALVE REPLACEMENT (AVR);  Surgeon: Gaye Pollack, MD;  Location: [place];  Service: Open Heart Surgery;  Laterality: N/A;   BIOPSY  12/15/2017   Procedure: BIOPSY;  Surgeon: Rush Landmark Telford Nab., MD;  Location: [place];  Service: Gastroenterology;;   BIOPSY  01/10/2021   Procedure: BIOPSY;  Surgeon: Irving Copas., MD;  Location: [place];  Service: Gastroenterology;;   BREAST BIOPSY     BREAST LUMPECTOMY Right    2005?   BREAST SURGERY     BRONCHIAL BIOPSY  03/06/2020   Procedure: BRONCHIAL BIOPSIES;  Surgeon: Lamonte Sakai,  Rose Fillers, MD;  Location: [facility] ENDOSCOPY;  Service: Pulmonary;;   BRONCHIAL BRUSHINGS  03/06/2020   Procedure: BRONCHIAL BRUSHINGS;  Surgeon: Collene Gobble, MD;  Location: [facility] ENDOSCOPY;  Service: Pulmonary;;   BRONCHIAL NEEDLE ASPIRATION BIOPSY  03/06/2020   Procedure: BRONCHIAL NEEDLE ASPIRATION BIOPSIES;  Surgeon: Collene Gobble, MD;  Location: [facility] ENDOSCOPY;  Service: Pulmonary;;   BRONCHIAL WASHINGS  03/06/2020   Procedure: BRONCHIAL WASHINGS;  Surgeon: Collene Gobble, MD;  Location: [facility] ENDOSCOPY;  Service: Pulmonary;;   CARDIAC CATHETERIZATION N/A 10/18/2014   Procedure: Right/Left Heart Cath and Coronary Angiography;  Surgeon: [name], MD;  Location: [place] CV LAB;  Service: Cardiovascular;  Laterality: N/A;   CARDIOVERSION N/A 12/09/2019   Procedure: CARDIOVERSION;  Surgeon: Josue Hector, MD;  Location: [place];  Service: Cardiovascular;  Laterality: N/A;   CARDIOVERSION N/A 12/30/2019   Procedure: CARDIOVERSION;  Surgeon: Josue Hector, MD;  Location: [facility] ENDOSCOPY;  Service: Cardiovascular;  Laterality: N/A;   COLONOSCOPY WITH PROPOFOL N/A 01/10/2021   Procedure: COLONOSCOPY WITH PROPOFOL;  Surgeon: Irving Copas., MD;  Location: [place];  Service: Gastroenterology;  Laterality: N/A;   CORONARY ANGIOGRAPHY N/A 02/04/2019   Procedure: CORONARY ANGIOGRAPHY;  Surgeon: Nelva Bush, MD;  Location: [place] CV LAB;  Service: Cardiovascular;  Laterality: N/A;   COSMETIC SURGERY     face   ESOPHAGOGASTRODUODENOSCOPY (EGD) WITH PROPOFOL N/A 12/15/2017   Procedure: ESOPHAGOGASTRODUODENOSCOPY (EGD) WITH PROPOFOL;  Surgeon: Rush Landmark Telford Nab., MD;  Location: [place];  Service: Gastroenterology;  Laterality: N/A;   FIDUCIAL MARKER PLACEMENT  03/06/2020   Procedure: FIDUCIAL MARKER PLACEMENT;  Surgeon: Collene Gobble, MD;  Location: [facility] ENDOSCOPY;  Service: Pulmonary;;   PARATHYROIDECTOMY     TEE WITHOUT CARDIOVERSION N/A 11/16/2014   Procedure: TRANSESOPHAGEAL ECHOCARDIOGRAM (TEE);  Surgeon: Gaye Pollack, MD;  Location: [place];  Service: Open Heart Surgery;  Laterality: N/A;   TEE WITHOUT CARDIOVERSION N/A 12/09/2019   Procedure: TRANSESOPHAGEAL ECHOCARDIOGRAM (TEE);  Surgeon: Josue Hector, MD;  Location: [facility] ENDOSCOPY;  Service: Cardiovascular;  Laterality: N/A;   TONSILLECTOMY     VIDEO BRONCHOSCOPY WITH ENDOBRONCHIAL NAVIGATION Right 03/06/2020   Procedure: VIDEO BRONCHOSCOPY WITH ENDOBRONCHIAL NAVIGATION;  Surgeon: Collene Gobble, MD;  Location: [facility] ENDOSCOPY;  Service: Pulmonary;  Laterality: Right;    Social History   Socioeconomic History   Marital status: Married    Spouse name: Not on file   Number of children: 3   Years of education: 16   Highest education level: Bachelor's degree (e.g., BA, AB, BS)  Occupational History   Occupation: Retired  Tobacco Use   Smoking status: Former    Packs/day: 0.50    Years: 15.00    Pack years: 7.50    Types: Cigarettes    Start date: 1971    Quit date: 1986    Years since quitting: 36.8   Smokeless tobacco: Never   Tobacco comments:    quit smoking 1980  Vaping Use   Vaping Use: Never used  Substance and Sexual Activity   Alcohol use: Yes    Alcohol/week: 7.0 standard drinks    Types: 7 Glasses of wine per week    Comment: wine nightly with dinner   Drug use: No   Sexual activity: Not  Currently  Other Topics Concern   Not on file  Social History Narrative   ** Merged History Encounter **       Lives at [facility] with her husband. Right-handed.  Caffeine use: 3-4 cups per day (some tea, mixes coffee with decaf).   Social Determinants of Health   Financial Resource Strain: Not on file  Food Insecurity: Not on file  Transportation Needs: Not on file  Physical Activity: Not on file  Stress: Not on file  Social Connections: Not on file    Family History  Problem Relation Age of Onset   Hypertension Mother    Rectal cancer Mother    Colon cancer Mother    Kidney disease Father    Cancer Cousin 29       breast and ovarian    Esophageal cancer Neg Hx    Stomach cancer Neg Hx    Colon polyps Neg Hx     Review of Systems  Constitutional:  Negative for fever.  Respiratory:  Positive for shortness of breath (with certain activities when in afib). Negative for cough and wheezing.   Cardiovascular:  Positive for leg swelling (mild). Negative for chest pain and palpitations.  Genitourinary:  Negative for dysuria.  Neurological:  Negative for light-headedness and headaches.  Psychiatric/Behavioral:  Negative for dysphoric mood. The patient is nervous/anxious.       Objective:   Vitals:   02/07/21 1017  BP: 124/78  Pulse: 88  Temp: 98 F (36.7 C)  SpO2: 97%   BP Readings from Last 3 Encounters:  02/07/21 124/78  02/01/21 136/74  01/10/21 (!) 145/78   Wt Readings from Last 3 Encounters:  02/07/21 162 lb 6.4 oz (73.7 kg)  02/01/21 157 lb 12.8 oz (71.6 kg)  01/10/21 158 lb (71.7 kg)   Body mass index is 27.02 kg/m.   Physical Exam    Constitutional: Appears well-developed and well-nourished. No distress.  HENT:  Head: Normocephalic and atraumatic.  Neck: Neck supple. No tracheal deviation present. No thyromegaly present.  No cervical lymphadenopathy Cardiovascular: Normal rate, irregular rhythm and normal heart sounds.   No murmur heard. No  carotid bruit .  Trace b/l LE edema Pulmonary/Chest: Effort normal and breath sounds normal. No respiratory distress. No has no wheezes. No rales.  Skin: Skin is warm and dry. Not diaphoretic.  Psychiatric: Normal mood and affect. Behavior is normal.      Assessment & Plan:    See Problem List for Assessment and Plan of chronic medical problems.

## 2021-02-06 NOTE — Patient Instructions (Addendum)
  Blood work was ordered.     Medications changes include :   sertraline 25 mg daily  Your prescription(s) have been submitted to your pharmacy. Please take as directed and contact our office if you believe you are having problem(s) with the medication(s).     Please followup in 6 months

## 2021-02-07 ENCOUNTER — Other Ambulatory Visit: Payer: Self-pay

## 2021-02-07 ENCOUNTER — Other Ambulatory Visit: Payer: Self-pay | Admitting: Pharmacist

## 2021-02-07 ENCOUNTER — Ambulatory Visit (INDEPENDENT_AMBULATORY_CARE_PROVIDER_SITE_OTHER): Payer: Medicare Other | Admitting: Internal Medicine

## 2021-02-07 VITALS — BP 124/78 | HR 88 | Temp 98.0°F | Ht 65.0 in | Wt 162.4 lb

## 2021-02-07 DIAGNOSIS — I4891 Unspecified atrial fibrillation: Secondary | ICD-10-CM | POA: Diagnosis not present

## 2021-02-07 DIAGNOSIS — I2510 Atherosclerotic heart disease of native coronary artery without angina pectoris: Secondary | ICD-10-CM

## 2021-02-07 DIAGNOSIS — R7303 Prediabetes: Secondary | ICD-10-CM

## 2021-02-07 DIAGNOSIS — Z23 Encounter for immunization: Secondary | ICD-10-CM | POA: Diagnosis not present

## 2021-02-07 DIAGNOSIS — K279 Peptic ulcer, site unspecified, unspecified as acute or chronic, without hemorrhage or perforation: Secondary | ICD-10-CM | POA: Diagnosis not present

## 2021-02-07 DIAGNOSIS — F419 Anxiety disorder, unspecified: Secondary | ICD-10-CM

## 2021-02-07 DIAGNOSIS — E538 Deficiency of other specified B group vitamins: Secondary | ICD-10-CM

## 2021-02-07 DIAGNOSIS — F5101 Primary insomnia: Secondary | ICD-10-CM | POA: Diagnosis not present

## 2021-02-07 DIAGNOSIS — M8589 Other specified disorders of bone density and structure, multiple sites: Secondary | ICD-10-CM

## 2021-02-07 DIAGNOSIS — E782 Mixed hyperlipidemia: Secondary | ICD-10-CM | POA: Diagnosis not present

## 2021-02-07 DIAGNOSIS — I10 Essential (primary) hypertension: Secondary | ICD-10-CM | POA: Diagnosis not present

## 2021-02-07 MED ORDER — APIXABAN 5 MG PO TABS: 5.0000 mg | ORAL_TABLET | Freq: Two times a day (BID) | ORAL | 2 refills | Status: DC

## 2021-02-07 MED ORDER — SERTRALINE HCL 25 MG PO TABS: 25.0000 mg | ORAL_TABLET | Freq: Every day | ORAL | 3 refills | Status: DC

## 2021-02-07 NOTE — Assessment & Plan Note (Signed)
Chronic Check a1c Low sugar / carb diet Stressed regular exercise  

## 2021-02-07 NOTE — Assessment & Plan Note (Signed)
Chronic Controlled, stable Continue chloral hydrate

## 2021-02-07 NOTE — Assessment & Plan Note (Signed)
Chronic dexa due - ordered She is able to exercise some, but limited by afib Continue calcium and vitamin d

## 2021-02-07 NOTE — Assessment & Plan Note (Signed)
Chronic Not ideally controlled Start sertraline 25 mg daily Continue alprazolam 0.5 mg daily prn

## 2021-02-07 NOTE — Assessment & Plan Note (Signed)
Chronic BP well controlled Continue metoprolol 25 mg qd cmp

## 2021-02-07 NOTE — Assessment & Plan Note (Signed)
Chronic ?Check B12 level ?

## 2021-02-07 NOTE — Assessment & Plan Note (Signed)
Chronic Management per cardiology SOB related to Afib, no chest pain or symptoms c/w angina Continue current medications

## 2021-02-07 NOTE — Assessment & Plan Note (Signed)
H/o PUD Denies gerd Continue protonix 40 mg daily

## 2021-02-07 NOTE — Assessment & Plan Note (Signed)
Chronic Management per cardiology On eliquis, metoprolol

## 2021-02-07 NOTE — Assessment & Plan Note (Signed)
Chronic Check lipid panel  Continue crestor 20 mg daily Regular exercise and healthy diet encouraged  

## 2021-02-08 NOTE — Addendum Note (Signed)
Addended by: Marcina Millard on: 02/08/2021 10:22 AM   Modules accepted: Orders

## 2021-02-11 ENCOUNTER — Ambulatory Visit: Payer: Medicare Other

## 2021-02-19 ENCOUNTER — Encounter: Payer: Self-pay | Admitting: Internal Medicine

## 2021-02-27 ENCOUNTER — Encounter: Payer: Self-pay | Admitting: Internal Medicine

## 2021-02-28 MED ORDER — HYDROCODONE BIT-HOMATROP MBR 5-1.5 MG/5ML PO SOLN: 5.0000 mL | Freq: Three times a day (TID) | ORAL | 0 refills | Status: DC | PRN

## 2021-03-02 ENCOUNTER — Other Ambulatory Visit: Payer: Self-pay

## 2021-03-02 ENCOUNTER — Emergency Department (HOSPITAL_BASED_OUTPATIENT_CLINIC_OR_DEPARTMENT_OTHER): Payer: Medicare Other | Admitting: Radiology

## 2021-03-02 ENCOUNTER — Emergency Department (HOSPITAL_BASED_OUTPATIENT_CLINIC_OR_DEPARTMENT_OTHER)
Admission: EM | Admit: 2021-03-02 | Discharge: 2021-03-02 | Disposition: A | Discharge status: Home / Self Care | Payer: Medicare Other | Attending: Emergency Medicine | Admitting: Emergency Medicine

## 2021-03-02 ENCOUNTER — Encounter (HOSPITAL_BASED_OUTPATIENT_CLINIC_OR_DEPARTMENT_OTHER): Payer: Self-pay

## 2021-03-02 DIAGNOSIS — J029 Acute pharyngitis, unspecified: Secondary | ICD-10-CM | POA: Diagnosis not present

## 2021-03-02 DIAGNOSIS — Z85118 Personal history of other malignant neoplasm of bronchus and lung: Secondary | ICD-10-CM | POA: Insufficient documentation

## 2021-03-02 DIAGNOSIS — Z7901 Long term (current) use of anticoagulants: Secondary | ICD-10-CM | POA: Insufficient documentation

## 2021-03-02 DIAGNOSIS — Z79899 Other long term (current) drug therapy: Secondary | ICD-10-CM | POA: Diagnosis not present

## 2021-03-02 DIAGNOSIS — J069 Acute upper respiratory infection, unspecified: Secondary | ICD-10-CM

## 2021-03-02 DIAGNOSIS — Z853 Personal history of malignant neoplasm of breast: Secondary | ICD-10-CM | POA: Insufficient documentation

## 2021-03-02 DIAGNOSIS — R509 Fever, unspecified: Secondary | ICD-10-CM | POA: Insufficient documentation

## 2021-03-02 DIAGNOSIS — R059 Cough, unspecified: Secondary | ICD-10-CM | POA: Insufficient documentation

## 2021-03-02 DIAGNOSIS — I4891 Unspecified atrial fibrillation: Secondary | ICD-10-CM | POA: Diagnosis not present

## 2021-03-02 DIAGNOSIS — I517 Cardiomegaly: Secondary | ICD-10-CM | POA: Diagnosis not present

## 2021-03-02 DIAGNOSIS — Z87891 Personal history of nicotine dependence: Secondary | ICD-10-CM | POA: Diagnosis not present

## 2021-03-02 DIAGNOSIS — R0981 Nasal congestion: Secondary | ICD-10-CM | POA: Diagnosis not present

## 2021-03-02 DIAGNOSIS — I5033 Acute on chronic diastolic (congestive) heart failure: Secondary | ICD-10-CM | POA: Diagnosis not present

## 2021-03-02 DIAGNOSIS — I2510 Atherosclerotic heart disease of native coronary artery without angina pectoris: Secondary | ICD-10-CM | POA: Insufficient documentation

## 2021-03-02 DIAGNOSIS — B9789 Other viral agents as the cause of diseases classified elsewhere: Secondary | ICD-10-CM | POA: Diagnosis not present

## 2021-03-02 DIAGNOSIS — I110 Hypertensive heart disease with heart failure: Secondary | ICD-10-CM | POA: Insufficient documentation

## 2021-03-02 DIAGNOSIS — Z20822 Contact with and (suspected) exposure to covid-19: Secondary | ICD-10-CM | POA: Diagnosis not present

## 2021-03-02 LAB — RESP PANEL BY RT-PCR (FLU A&B, COVID) ARPGX2
Influenza A by PCR: NEGATIVE
Influenza B by PCR: NEGATIVE
SARS Coronavirus 2 by RT PCR: NEGATIVE

## 2021-03-02 NOTE — Discharge Instructions (Addendum)
He does not negative for pneumonia, flu, COVID.  Continue taking your supportive medicine at home, follow-up with your primary care doctor in a week if no improvement.  Return to the ED if things change or worsen.

## 2021-03-02 NOTE — ED Notes (Signed)
ED Provider at bedside. 

## 2021-03-02 NOTE — ED Notes (Signed)
Patient transported to X-ray 

## 2021-03-02 NOTE — ED Provider Notes (Signed)
[facility] EMERG[facility] DEPT Provider Note   CSN: [id number] Arrival date & time: 03/02/21  1138     History Chief Complaint  Patient presents with   Cough    [name] is a 87 y.o. female.   Cough Associated symptoms: fever and sore throat   Associated symptoms: no chest pain, no headaches, no myalgias and no shortness of breath    Patient presents with cough.  Happened acutely 2 days ago, its been intermittent since then.  It tends to be worse at night, she has tried Tussionex at night which is provided some relief.  Denies any productive cough, no hemoptysis or chest pain.  She developed a fever yesterday that was 100.2 orally.  Was given Tylenol for it.  Denies any aggravating factors for the symptoms, she does have a history of a cancerous lung nodule which has been treated with radiation.  Denies any pulmonary disease such as COPD or asthma.  Patient had a sore throat that lasted for an hour, denies any other symptoms.  Past Medical History:  Diagnosis Date   Allergy    Anemia    Anxiety    Aortic valve stenosis, severe    s/p AVR // mild to mod paravalvular leakage // Echo 9/21: EF 55-60, no RWMA, mild LVH, GR 2 DD, mildly reduced RVSF, RVSP 38.7, moderate LAE, mild RAE, AVR with mean gradient 11 mmHg, mild paravalvular leakage   Arthritis    Breast cancer ([place]) 2007   right   Cataract    Constipation    Dysrhythmia    A-fib   Hiatal hernia    pt denies   Hypercholesteremia    Hypertension    Hypoglycemia    Osteopenia    Personal history of radiation therapy    Tingling    Ulcer 2012   bleeding gastric Ulcer    Patient Active Problem List   Diagnosis Date Noted   Family history of colon cancer 10/20/2020   Abnormal CT of the abdomen 10/20/2020   Pneumatosis intestinalis 10/20/2020   Secondary hypercoagulable state ([place]) 10/04/2020   URI (upper respiratory infection) 09/12/2020   Acute on chronic congestive heart failure with left  ventricular diastolic dysfunction ([place]) 07/13/2020   Aortic atherosclerosis ([place]) 01/06/2020   Cough 12/22/2019   Palpitations 12/07/2019   Atrial fibrillation ([place]) 12/07/2019   PUD (peptic ulcer disease) 12/07/2019   Dyspnea on exertion 12/07/2019   Eczema 07/27/2019   CAD (coronary artery disease) 06/23/2019   NSTEMI (non-ST elevated myocardial infarction) ([place])    Iron deficiency 12/26/2018   Anemia 12/26/2018   B12 deficiency 12/23/2018   Discoloration of skin of finger 11/11/2018   Chronic gastric ulcer without hemorrhage and without perforation 06/05/2018   Constipation, chronic 06/05/2018   Callus of foot 03/04/2018   Hammertoes of both feet 03/04/2018   History of peptic ulcer disease 12/21/2017   Hypertension 12/21/2017   Syncope 12/14/2017   Macrocytic anemia 12/14/2017   Near syncope 12/14/2017   Left sided numbness 10/19/2017   Small vessel disease, cerebrovascular 10/19/2017   Numbness and tingling of left arm and leg 10/13/2017   Anxiety 10/02/2017   Macular pucker, right eye 07/24/2017   Pseudophakia of both eyes 07/24/2017   Prediabetes 09/29/2016   Malignant neoplasm of upper lobe of right lung (HCC) 09/29/2016   Leg cramps 01/29/2016   Insomnia 09/12/2015   Rectal bleed 09/12/2015   S/P AVR 11/16/2014   Branch retinal vein occlusion 10/12/2013   Iris nevus,  right 10/12/2013   Aortic valve stenosis, severe    Allergic rhinitis 09/10/2009   Hyperlipidemia 11/30/2006   Mitral valve disease 10/29/2006   Osteopenia 10/29/2006   History of right breast cancer 10/29/2006    Past Surgical History:  Procedure Laterality Date   AORTIC VALVE REPLACEMENT N/A 11/16/2014   Procedure: AORTIC VALVE REPLACEMENT (AVR);  Surgeon: Gaye Pollack, MD;  Location: [place];  Service: Open Heart Surgery;  Laterality: N/A;   BIOPSY  12/15/2017   Procedure: BIOPSY;  Surgeon: Rush Landmark Telford Nab., MD;  Location: [place];  Service: Gastroenterology;;   BIOPSY  01/10/2021    Procedure: BIOPSY;  Surgeon: Irving Copas., MD;  Location: [place];  Service: Gastroenterology;;   BREAST BIOPSY     BREAST LUMPECTOMY Right    2005?   BREAST SURGERY     BRONCHIAL BIOPSY  03/06/2020   Procedure: BRONCHIAL BIOPSIES;  Surgeon: Collene Gobble, MD;  Location: [facility] ENDOSCOPY;  Service: Pulmonary;;   BRONCHIAL BRUSHINGS  03/06/2020   Procedure: BRONCHIAL BRUSHINGS;  Surgeon: Collene Gobble, MD;  Location: [facility] ENDOSCOPY;  Service: Pulmonary;;   BRONCHIAL NEEDLE ASPIRATION BIOPSY  03/06/2020   Procedure: BRONCHIAL NEEDLE ASPIRATION BIOPSIES;  Surgeon: Collene Gobble, MD;  Location: [facility] ENDOSCOPY;  Service: Pulmonary;;   BRONCHIAL WASHINGS  03/06/2020   Procedure: BRONCHIAL WASHINGS;  Surgeon: Collene Gobble, MD;  Location: [facility] ENDOSCOPY;  Service: Pulmonary;;   CARDIAC CATHETERIZATION N/A 10/18/2014   Procedure: Right/Left Heart Cath and Coronary Angiography;  Surgeon: [name], MD;  Location: [place] CV LAB;  Service: Cardiovascular;  Laterality: N/A;   CARDIOVERSION N/A 12/09/2019   Procedure: CARDIOVERSION;  Surgeon: Josue Hector, MD;  Location: [place];  Service: Cardiovascular;  Laterality: N/A;   CARDIOVERSION N/A 12/30/2019   Procedure: CARDIOVERSION;  Surgeon: Josue Hector, MD;  Location: [facility] ENDOSCOPY;  Service: Cardiovascular;  Laterality: N/A;   COLONOSCOPY WITH PROPOFOL N/A 01/10/2021   Procedure: COLONOSCOPY WITH PROPOFOL;  Surgeon: Irving Copas., MD;  Location: [place];  Service: Gastroenterology;  Laterality: N/A;   CORONARY ANGIOGRAPHY N/A 02/04/2019   Procedure: CORONARY ANGIOGRAPHY;  Surgeon: Nelva Bush, MD;  Location: [place] CV LAB;  Service: Cardiovascular;  Laterality: N/A;   COSMETIC SURGERY     face   ESOPHAGOGASTRODUODENOSCOPY (EGD) WITH PROPOFOL N/A 12/15/2017   Procedure: ESOPHAGOGASTRODUODENOSCOPY (EGD) WITH PROPOFOL;  Surgeon: Rush Landmark Telford Nab., MD;  Location: [place];  Service:  Gastroenterology;  Laterality: N/A;   FIDUCIAL MARKER PLACEMENT  03/06/2020   Procedure: FIDUCIAL MARKER PLACEMENT;  Surgeon: Collene Gobble, MD;  Location[facility] ENDOSCOPY;  Service: Pulmonary;;   PARATHYROIDECTOMY     TEE WITHOUT CARDIOVERSION N/A 11/16/2014   Procedure: TRANSESOPHAGEAL ECHOCARDIOGRAM (TEE);  Surgeon: Gaye Pollack, MD;  Location: [place];  Service: Open Heart Surgery;  Laterality: N/A;   TEE WITHOUT CARDIOVERSION N/A 12/09/2019   Procedure: TRANSESOPHAGEAL ECHOCARDIOGRAM (TEE);  Surgeon: Josue Hector, MD;  Location: [facility] ENDOSCOPY;  Service: Cardiovascular;  Laterality: N/A;   TONSILLECTOMY     VIDEO BRONCHOSCOPY WITH ENDOBRONCHIAL NAVIGATION Right 03/06/2020   Procedure: VIDEO BRONCHOSCOPY WITH ENDOBRONCHIAL NAVIGATION;  Surgeon: Collene Gobble, MD;  Location: [facility] ENDOSCOPY;  Service: Pulmonary;  Laterality: Right;     OB History   No obstetric history on file.     Family History  Problem Relation Age of Onset   Hypertension Mother    Rectal cancer Mother    Colon cancer Mother    Kidney disease Father  Cancer Cousin 62       breast and ovarian    Esophageal cancer Neg Hx    Stomach cancer Neg Hx    Colon polyps Neg Hx     Social History   Tobacco Use   Smoking status: Former    Packs/day: 0.50    Years: 15.00    Pack years: 7.50    Types: Cigarettes    Start date: 43    Quit date: 1986    Years since quitting: 36.8   Smokeless tobacco: Never   Tobacco comments:    quit smoking 1980  Vaping Use   Vaping Use: Never used  Substance Use Topics   Alcohol use: Yes    Alcohol/week: 7.0 standard drinks    Types: 7 Glasses of wine per week    Comment: wine nightly with dinner   Drug use: No    Home Medications Prior to Admission medications   Medication Sig Start Date End Date Taking? Authorizing Provider  acetaminophen (TYLENOL) 500 MG tablet Take 500-1,000 mg by mouth every 6 (six) hours as needed for mild pain or moderate pain.     [provider]  ALPRAZolam (XANAX) 0.5 MG tablet TAKE 1 TABLET ONCE DAILY AS NEEDED FOR AN ANXIETY, DO NOT TAKE AT NIGHT. 01/29/21   [name], MD  apixaban (ELIQUIS) 5 MG TABS tablet Take 1 tablet (5 mg total) by mouth 2 (two) times daily. 02/07/21   [name], MD  Calcium Carb-Cholecalciferol 600-400 MG-UNIT TABS Take 1 tablet by mouth daily.    [provider]  Chloral Hydrate CRYS Liquid 100 mg/ ml.    Take 10 mls PO HS prn 01/05/21   [name], MD  Cholecalciferol (VITAMIN D-3) 1000 UNITS CAPS Take 1,000 Units by mouth daily.     [provider]  clindamycin (CLEOCIN) 300 MG capsule Take 600 mg by mouth See admin instructions. Take 600 mg 1 hour prior to dental work    [provider]  ferrous sulfate 325 (65 FE) MG tablet Take 325 mg by mouth 2 (two) times a week. Every Monday and Wednesday    [provider]  furosemide (LASIX) 20 MG tablet Take 0.5 tablets (10 mg total) by mouth daily. You may take 1 extra tablet daily for weight gain of 3 pounds. 10/11/20 10/06/21  [name], MD  HYDROcodone bit-homatropine (HYCODAN) 5-1.5 MG/5ML syrup Take 5 mLs by mouth every 8 (eight) hours as needed for cough. 02/28/21   [name], MD  ipratropium (ATROVENT) 0.06 % nasal spray Place 2 sprays into both nostrils in the morning and at bedtime. 08/09/20   [provider]  levocetirizine (XYZAL) 5 MG tablet Take 5 mg by mouth at bedtime as needed for allergies.  10/11/18   [provider]  metoprolol succinate (TOPROL XL) 25 MG 24 hr tablet Take 1 tablet (25 mg total) by mouth daily. 02/01/21   [name], MD  montelukast (SINGULAIR) 10 MG tablet TAKE 1 TABLET EACH DAY. 05/28/20   Burns, Claudina Lick, MD  nitroGLYCERIN (NITROSTAT) 0.4 MG SL tablet Place 1 tablet (0.4 mg total) under the tongue every 5 (five) minutes as needed for chest pain (CP or SOB). 02/05/19   Duke, Tami Lin, PA  Ophthalmic Irrigation Solution (OCUSOFT EYE [place]  OP) Place 1 application into both eyes at bedtime.    [provider]  OVER THE COUNTER MEDICATION Apply 1 application topically daily as needed (pain). Hemp Cream  [provider]  pantoprazole (PROTONIX) 40 MG tablet TAKE 1 TABLET BY MOUTH TWICE DAILY. 11/06/20   Mansouraty, Telford Nab., MD  Polyethyl Glycol-Propyl Glycol (SYSTANE ULTRA) 0.4-0.3 % SOLN Place 1 drop into both eyes daily as needed (dry eyes).    [provider]  polyethylene glycol (MIRALAX / GLYCOLAX) 17 g packet Take 17 g by mouth daily.    [provider]  potassium chloride (KLOR-CON) 10 MEQ tablet TAKE 1 TABLET BY MOUTH DAILY. 01/29/21   [name], MD  rosuvastatin (CRESTOR) 20 MG tablet TAKE 1 TABLET ONCE DAILY. 08/17/20   [name], MD  sertraline (ZOLOFT) 25 MG tablet Take 1 tablet (25 mg total) by mouth daily. 02/07/21   [name], MD  tretinoin (RETIN-A) 0.1 % cream Apply 1 application topically every Monday, Wednesday, and Friday.  01/18/09   [provider]    Allergies    Lactose intolerance (gi), Amoxicillin-pot clavulanate, Penicillins, Sulfa antibiotics, and Sulfonamide derivatives  Review of Systems   Review of Systems  Constitutional:  Positive for fever.  HENT:  Positive for congestion and sore throat.   Respiratory:  Positive for cough. Negative for shortness of breath.   Cardiovascular:  Negative for chest pain.  Musculoskeletal:  Negative for myalgias.  Neurological:  Negative for headaches.   Physical Exam Updated Vital Signs BP 121/79 (BP Location: Right Arm)   Pulse 80   Temp 97.9 F (36.6 C) (Oral)   Resp 16   SpO2 96%   Physical Exam Vitals and nursing note reviewed. Exam conducted with a chaperone present.  Constitutional:      Appearance: Normal appearance.  HENT:     Head: Normocephalic.     Nose: Congestion present.     Mouth/Throat:     Pharynx: No posterior oropharyngeal erythema.  Eyes:     Extraocular Movements:  Extraocular movements intact.     Pupils: Pupils are equal, round, and reactive to light.  Cardiovascular:     Rate and Rhythm: Normal rate and regular rhythm.     Pulses: Normal pulses.  Pulmonary:     Effort: Pulmonary effort is normal.     Comments: Lung sounds are somewhat diminished to the left, no accessory muscle use. Speaking in complete sentences.  No adventitious lung sounds. Abdominal:     General: Abdomen is flat.     Palpations: Abdomen is soft.  Musculoskeletal:     Cervical back: Normal range of motion.  Neurological:     Mental Status: She is alert.  Psychiatric:        Mood and Affect: Mood normal.    ED Results / Procedures / Treatments   Labs (all labs ordered are listed, but only abnormal results are displayed) Labs Reviewed  RESP PANEL BY RT-PCR (FLU A&B, COVID) [id number]    EKG None  Radiology No results found.  Procedures Procedures   Medications Ordered in ED Medications - No data to display  ED Course  I have reviewed the triage vital signs and the nursing notes.  Pertinent labs & imaging results that were available during my care of the patient were reviewed by me and considered in my medical decision making (see chart for details).    MDM Rules/Calculators/A&P                           Patient vitals are stable, she is nontoxic-appearing.  No hypoxia or tachycardia in the room.  No history of COPD or asthma, not in exacerbation.  She does have viral symptoms that have been going on for 2 days, will get COVID and flu test as well as a chest x-ray given patient is concerned about pneumonia.  Patient vitals are stable, she is well-appearing.  Chest x-ray is negative for pneumonia, viral panel negative for flu or COVID.  Discussed the results with the patient, advised supportive treatment and to follow-up with her PCP in a week if no provement.  Do not think she warrants additional evaluation at this time based on her stable vitals, lack of  additional symptoms, picture suggestive of URI.  She is good to follow-up outpatient, discharged in stable position.  Final Clinical Impression(s) / ED Diagnoses Final diagnoses:  None    Rx / DC Orders ED Discharge Orders     None        Sherrill Raring, [place] 03/02/21 1313    Lacretia Leigh, MD 03/05/21 1557

## 2021-03-02 NOTE — ED Triage Notes (Addendum)
Pt presents POV for ongoing cough and congestion x2 days. Pt is a resident of [facility] and had a negative covid test last night. Pt also reports having a fever, taking OTC cough syrup w/no relief. RN sent pt here for CXR to rule out PNA or Flu

## 2021-03-04 ENCOUNTER — Encounter: Payer: Self-pay | Admitting: Internal Medicine

## 2021-03-05 ENCOUNTER — Encounter: Payer: Self-pay | Admitting: Nurse Practitioner

## 2021-03-05 ENCOUNTER — Ambulatory Visit (INDEPENDENT_AMBULATORY_CARE_PROVIDER_SITE_OTHER): Payer: Medicare Other | Admitting: Nurse Practitioner

## 2021-03-05 ENCOUNTER — Other Ambulatory Visit: Payer: Self-pay

## 2021-03-05 VITALS — BP 100/68 | HR 69 | Temp 97.5°F | Resp 20 | Ht 65.0 in | Wt 163.4 lb

## 2021-03-05 DIAGNOSIS — J069 Acute upper respiratory infection, unspecified: Secondary | ICD-10-CM | POA: Diagnosis not present

## 2021-03-05 DIAGNOSIS — R509 Fever, unspecified: Secondary | ICD-10-CM | POA: Insufficient documentation

## 2021-03-05 LAB — CBC WITH DIFFERENTIAL/PLATELET
Basophils Absolute: 0.1 10*3/uL (ref 0.0–0.1)
Basophils Relative: 0.9 % (ref 0.0–3.0)
Eosinophils Absolute: 0.1 10*3/uL (ref 0.0–0.7)
Eosinophils Relative: 1.1 % (ref 0.0–5.0)
HCT: 37.6 % (ref 36.0–46.0)
Hemoglobin: 12.4 g/dL (ref 12.0–15.0)
Lymphocytes Relative: 19.3 % (ref 12.0–46.0)
Lymphs Abs: 1.7 10*3/uL (ref 0.7–4.0)
MCHC: 33 g/dL (ref 30.0–36.0)
MCV: 97.2 fl (ref 78.0–100.0)
Monocytes Absolute: 1 10*3/uL (ref 0.1–1.0)
Monocytes Relative: 12 % (ref 3.0–12.0)
Neutro Abs: 5.7 10*3/uL (ref 1.4–7.7)
Neutrophils Relative %: 66.7 % (ref 43.0–77.0)
Platelets: 140 10*3/uL — ABNORMAL LOW (ref 150.0–400.0)
RBC: 3.87 Mil/uL (ref 3.87–5.11)
RDW: 13.6 % (ref 11.5–15.5)
WBC: 8.6 10*3/uL (ref 4.0–10.5)

## 2021-03-05 NOTE — Patient Instructions (Signed)
Keep a check on your symptoms and temp. Message me by the end of the week Will check you white blood cells today

## 2021-03-05 NOTE — Progress Notes (Signed)
Acute Office Visit  Subjective:    Patient ID: [name], female    DOB: 08/09/1932, 87 y.o.   MRN: [id number]  Chief Complaint  Patient presents with   Fever    On 03/04/21 temp was 102-the highest it has been. Symptoms started on 03/01/21-had sneezing, nasal congestion, cough-more dry. Went to Urgent Care on 03/02/21-flu and covid test was negative. Chest xray was done. Cough has persisted but sneezing and congestion gotten better.     Patient is in today for fevers  States it is in the evening Symptoms started on 02/28/2021 continued. States that she went to ED and they flu and covid tested her, which came back negative. They also did a chest xray and it was negative for acute infectious process  Symptoms have improved a some, per patient report Hydrocodone cough syrup at night, delsym during the day. and taking tylenol 2 Tablets every 6 hours as needed.  Past Medical History:  Diagnosis Date   Allergy    Anemia    Anxiety    Aortic valve stenosis, severe    s/p AVR // mild to mod paravalvular leakage // Echo 9/21: EF 55-60, no RWMA, mild LVH, GR 2 DD, mildly reduced RVSF, RVSP 38.7, moderate LAE, mild RAE, AVR with mean gradient 11 mmHg, mild paravalvular leakage   Arthritis    Breast cancer ([place]) 2007   right   Cataract    Constipation    Dysrhythmia    A-fib   Hiatal hernia    pt denies   Hypercholesteremia    Hypertension    Hypoglycemia    Osteopenia    Personal history of radiation therapy    Tingling    Ulcer 2012   bleeding gastric Ulcer    Past Surgical History:  Procedure Laterality Date   AORTIC VALVE REPLACEMENT N/A 11/16/2014   Procedure: AORTIC VALVE REPLACEMENT (AVR);  Surgeon: Gaye Pollack, MD;  Location: [place];  Service: Open Heart Surgery;  Laterality: N/A;   BIOPSY  12/15/2017   Procedure: BIOPSY;  Surgeon: Rush Landmark Telford Nab., MD;  Location: [place];  Service: Gastroenterology;;   BIOPSY  01/10/2021   Procedure: BIOPSY;   Surgeon: Irving Copas., MD;  Location: [place];  Service: Gastroenterology;;   BREAST BIOPSY     BREAST LUMPECTOMY Right    2005?   BREAST SURGERY     BRONCHIAL BIOPSY  03/06/2020   Procedure: BRONCHIAL BIOPSIES;  Surgeon: Collene Gobble, MD;  Location: [facility] ENDOSCOPY;  Service: Pulmonary;;   BRONCHIAL BRUSHINGS  03/06/2020   Procedure: BRONCHIAL BRUSHINGS;  Surgeon: Collene Gobble, MD;  Location: [facility] ENDOSCOPY;  Service: Pulmonary;;   BRONCHIAL NEEDLE ASPIRATION BIOPSY  03/06/2020   Procedure: BRONCHIAL NEEDLE ASPIRATION BIOPSIES;  Surgeon: Collene Gobble, MD;  Location: [facility] ENDOSCOPY;  Service: Pulmonary;;   BRONCHIAL WASHINGS  03/06/2020   Procedure: BRONCHIAL WASHINGS;  Surgeon: Collene Gobble, MD;  Location: [facility] ENDOSCOPY;  Service: Pulmonary;;   CARDIAC CATHETERIZATION N/A 10/18/2014   Procedure: Right/Left Heart Cath and Coronary Angiography;  Surgeon: [name], MD;  Location: [place] [facility];  Service: Cardiovascular;  Laterality: N/A;   CARDIOVERSION N/A 12/09/2019   Procedure: CARDIOVERSION;  Surgeon: Josue Hector, MD;  Location: [place];  Service: Cardiovascular;  Laterality: N/A;   CARDIOVERSION N/A 12/30/2019   Procedure: CARDIOVERSION;  Surgeon: Josue Hector, MD;  Location: [facility] ENDOSCOPY;  Service: Cardiovascular;  Laterality: N/A;   COLONOSCOPY WITH PROPOFOL N/A 01/10/2021  Procedure: COLONOSCOPY WITH PROPOFOL;  Surgeon: Mansouraty, Telford Nab., MD;  Location: [place];  Service: Gastroenterology;  Laterality: N/A;   CORONARY ANGIOGRAPHY N/A 02/04/2019   Procedure: CORONARY ANGIOGRAPHY;  Surgeon: Nelva Bush, MD;  Location: [place] [facility];  Service: Cardiovascular;  Laterality: N/A;   COSMETIC SURGERY     face   ESOPHAGOGASTRODUODENOSCOPY (EGD) WITH PROPOFOL N/A 12/15/2017   Procedure: ESOPHAGOGASTRODUODENOSCOPY (EGD) WITH PROPOFOL;  Surgeon: Rush Landmark Telford Nab., MD;  Location: [place];  Service: Gastroenterology;  Laterality: N/A;    FIDUCIAL MARKER PLACEMENT  03/06/2020   Procedure: FIDUCIAL MARKER PLACEMENT;  Surgeon: Collene Gobble, MD;  Location: [facility] ENDOSCOPY;  Service: Pulmonary;;   PARATHYROIDECTOMY     TEE WITHOUT CARDIOVERSION N/A 11/16/2014   Procedure: TRANSESOPHAGEAL ECHOCARDIOGRAM (TEE);  Surgeon: Gaye Pollack, MD;  Location: [place];  Service: Open Heart Surgery;  Laterality: N/A;   TEE WITHOUT CARDIOVERSION N/A 12/09/2019   Procedure: TRANSESOPHAGEAL ECHOCARDIOGRAM (TEE);  Surgeon: Josue Hector, MD;  Location: [facility] ENDOSCOPY;  Service: Cardiovascular;  Laterality: N/A;   TONSILLECTOMY     VIDEO BRONCHOSCOPY WITH ENDOBRONCHIAL NAVIGATION Right 03/06/2020   Procedure: VIDEO BRONCHOSCOPY WITH ENDOBRONCHIAL NAVIGATION;  Surgeon: Collene Gobble, MD;  Location: [facility] ENDOSCOPY;  Service: Pulmonary;  Laterality: Right;    Family History  Problem Relation Age of Onset   Hypertension Mother    Rectal cancer Mother    Colon cancer Mother    Kidney disease Father    Cancer Cousin 55       breast and ovarian    Esophageal cancer Neg Hx    Stomach cancer Neg Hx    Colon polyps Neg Hx     Social History   Socioeconomic History   Marital status: Married    Spouse name: Not on file   Number of children: 3   Years of education: 16   Highest education level: Bachelor's degree (e.g., BA, AB, BS)  Occupational History   Occupation: Retired  Tobacco Use   Smoking status: Former    Packs/day: 0.50    Years: 15.00    Pack years: 7.50    Types: Cigarettes    Start date: 1971    Quit date: 1986    Years since quitting: 36.8   Smokeless tobacco: Never   Tobacco comments:    quit smoking 1980  Vaping Use   Vaping Use: Never used  Substance and Sexual Activity   Alcohol use: Yes    Alcohol/week: 7.0 standard drinks    Types: 7 Glasses of wine per week    Comment: wine nightly with dinner   Drug use: No   Sexual activity: Not Currently  Other Topics Concern   Not on file  Social History Narrative   **  Merged History Encounter **       Lives at [facility] with her husband. Right-handed. Caffeine use: 3-4 cups per day (some tea, mixes coffee with decaf).   Social Determinants of Health   Financial Resource Strain: Not on file  Food Insecurity: Not on file  Transportation Needs: Not on file  Physical Activity: Not on file  Stress: Not on file  Social Connections: Not on file  Intimate Partner Violence: Not At Risk   Fear of Current or Ex-Partner: No   Emotionally Abused: No   Physically Abused: No   Sexually Abused: No    Outpatient Medications Prior to Visit  Medication Sig Dispense Refill   acetaminophen (TYLENOL) 500 MG tablet Take 500-1,000  mg by mouth every 6 (six) hours as needed for mild pain or moderate pain.      ALPRAZolam (XANAX) 0.5 MG tablet TAKE 1 TABLET ONCE DAILY AS NEEDED FOR AN ANXIETY, DO NOT TAKE AT NIGHT. 90 tablet 0   apixaban (ELIQUIS) 5 MG TABS tablet Take 1 tablet (5 mg total) by mouth 2 (two) times daily. 120 tablet 2   Calcium Carb-Cholecalciferol 600-400 MG-UNIT TABS Take 1 tablet by mouth daily.     Chloral Hydrate CRYS Liquid 100 mg/ ml.    Take 10 mls PO HS prn 900 g 0   Cholecalciferol (VITAMIN D-3) 1000 UNITS CAPS Take 1,000 Units by mouth daily.      clindamycin (CLEOCIN) 300 MG capsule Take 600 mg by mouth See admin instructions. Take 600 mg 1 hour prior to dental work     ferrous sulfate 325 (65 FE) MG tablet Take 325 mg by mouth 2 (two) times a week. Every Monday and Wednesday     furosemide (LASIX) 20 MG tablet Take 0.5 tablets (10 mg total) by mouth daily. You may take 1 extra tablet daily for weight gain of 3 pounds. 45 tablet 3   HYDROcodone bit-homatropine (HYCODAN) 5-1.5 MG/5ML syrup Take 5 mLs by mouth every 8 (eight) hours as needed for cough. 120 mL 0   ipratropium (ATROVENT) 0.06 % nasal spray Place 2 sprays into both nostrils in the morning and at bedtime.     levocetirizine (XYZAL) 5 MG tablet Take 5 mg by mouth at bedtime as needed  for allergies.      metoprolol succinate (TOPROL XL) 25 MG 24 hr tablet Take 1 tablet (25 mg total) by mouth daily. 90 tablet 3   montelukast (SINGULAIR) 10 MG tablet TAKE 1 TABLET EACH DAY. 90 tablet 1   nitroGLYCERIN (NITROSTAT) 0.4 MG SL tablet Place 1 tablet (0.4 mg total) under the tongue every 5 (five) minutes as needed for chest pain (CP or SOB). 25 tablet 3   Ophthalmic Irrigation Solution (OCUSOFT EYE [place] OP) Place 1 application into both eyes at bedtime.     OVER THE COUNTER MEDICATION Apply 1 application topically daily as needed (pain). Hemp Cream     pantoprazole (PROTONIX) 40 MG tablet TAKE 1 TABLET BY MOUTH TWICE DAILY. 180 tablet 3   Polyethyl Glycol-Propyl Glycol (SYSTANE ULTRA) 0.4-0.3 % SOLN Place 1 drop into both eyes daily as needed (dry eyes).     polyethylene glycol (MIRALAX / GLYCOLAX) 17 g packet Take 17 g by mouth daily.     potassium chloride (KLOR-CON) 10 MEQ tablet TAKE 1 TABLET BY MOUTH DAILY. 90 tablet 3   rosuvastatin (CRESTOR) 20 MG tablet TAKE 1 TABLET ONCE DAILY. 90 tablet 3   sertraline (ZOLOFT) 25 MG tablet Take 1 tablet (25 mg total) by mouth daily. 30 tablet 3   tretinoin (RETIN-A) 0.1 % cream Apply 1 application topically every Monday, Wednesday, and Friday.      No facility-administered medications prior to visit.    Allergies  Allergen Reactions   Lactose Intolerance (Gi) Other (See Comments)    GI upset   Amoxicillin-Pot Clavulanate Hives and Rash   Penicillins Hives and Rash    30 years   Sulfa Antibiotics Hives and Rash   Sulfonamide Derivatives Hives and Rash    Review of Systems  Constitutional:  Positive for fever. Negative for chills.  HENT:  Positive for rhinorrhea. Negative for congestion and postnasal drip.   Respiratory:  Positive for cough. Negative  for shortness of breath.   Cardiovascular:  Negative for chest pain.  Gastrointestinal:  Negative for diarrhea, nausea and vomiting.  Genitourinary:  Negative for dysuria and  frequency.      Objective:    Physical Exam Vitals and nursing note reviewed.  Constitutional:      Appearance: Normal appearance.  HENT:     Head:     Comments: Wears hearing aids     Right Ear: Tympanic membrane, ear canal and external ear normal. There is no impacted cerumen.     Left Ear: Tympanic membrane, ear canal and external ear normal. There is no impacted cerumen.     Nose:     Right Sinus: No maxillary sinus tenderness or frontal sinus tenderness.     Left Sinus: No maxillary sinus tenderness or frontal sinus tenderness.     Mouth/Throat:     Mouth: Mucous membranes are moist.     Pharynx: Oropharynx is clear.  Eyes:     Extraocular Movements: Extraocular movements intact.  Cardiovascular:     Rate and Rhythm: Normal rate. Rhythm irregular.  Pulmonary:     Effort: Pulmonary effort is normal.     Breath sounds: Normal breath sounds.  Abdominal:     General: Bowel sounds are normal. There is no distension.     Palpations: There is no mass.     Tenderness: There is no abdominal tenderness.  Musculoskeletal:     Right lower leg: Edema present.     Left lower leg: Edema present.  Skin:    General: Skin is warm.  Neurological:     Mental Status: She is alert.  Psychiatric:        Mood and Affect: Mood normal.        Behavior: Behavior normal.        Thought Content: Thought content normal.        Judgment: Judgment normal.    BP 100/68   Pulse 69   Temp (!) 97.5 F (36.4 C)   Resp 20   Ht 5' 5"  (1.651 m)   Wt 163 lb 6 oz (74.1 kg)   SpO2 95%   BMI 27.19 kg/m  Wt Readings from Last 3 Encounters:  03/05/21 163 lb 6 oz (74.1 kg)  02/07/21 162 lb 6.4 oz (73.7 kg)  02/01/21 157 lb 12.8 oz (71.6 kg)    Health Maintenance Due  Topic Date Due   COVID-19 Vaccine (4 - Booster for Moderna series) 05/09/2020   DEXA SCAN  12/19/2020    There are no preventive care reminders to display for this patient.   Lab Results  Component Value Date   TSH 2.370  01/17/2021   Lab Results  Component Value Date   WBC 6.5 07/13/2020   HGB 13.1 07/13/2020   HCT 39.0 07/13/2020   MCV 95.9 07/13/2020   PLT 147.0 (L) 07/13/2020   Lab Results  Component Value Date   NA 136 07/13/2020   K 4.5 07/13/2020   CHLORIDE 101 10/31/2014   CO2 28 07/13/2020   GLUCOSE 90 07/13/2020   BUN 16 07/13/2020   CREATININE 0.90 10/05/2020   BILITOT 0.6 01/17/2021   ALKPHOS 107 01/17/2021   AST 31 01/17/2021   ALT 24 01/17/2021   PROT 6.1 01/17/2021   ALBUMIN 4.6 01/17/2021   CALCIUM 9.0 07/13/2020   ANIONGAP 9 03/06/2020   EGFR 70 (L) 10/31/2014   GFR 60.76 07/13/2020   Lab Results  Component Value Date   CHOL 177  07/13/2020   Lab Results  Component Value Date   HDL 83.50 07/13/2020   Lab Results  Component Value Date   LDLCALC 77 07/13/2020   Lab Results  Component Value Date   TRIG 82.0 07/13/2020   Lab Results  Component Value Date   CHOLHDL 2 07/13/2020   Lab Results  Component Value Date   HGBA1C 5.9 07/13/2020       Assessment & Plan:   Problem List Items Addressed This Visit       Respiratory   URI (upper respiratory infection)    Patient has been evaluated by ED they did a respiratory panel which was negative for flu and COVID and chest x-ray which came back negative patient is having intermittent fevers that is managed with over-the-counter treatments.  She is going get a new thermometer we did talk about appropriate technique about waiting at least 5 minutes after having needing to eat or drink to check her oral temperature.  Patient acknowledged.  She will continue to monitor her symptoms over the next couple days and send me information via MyChart towards of the week if she is not improving we can always consider antibiotic.  She normally requires doxycycline continue to monitor        Other   Fever - Primary   Relevant Orders   CBC with Differential/Platelet     No orders of the defined types were placed in this  encounter.  This visit occurred during the SARS-CoV-2 public health emergency.  Safety protocols were in place, including screening questions prior to the visit, additional usage of staff PPE, and extensive cleaning of exam room while observing appropriate contact time as indicated for disinfecting solutions.   Romilda Garret, NP

## 2021-03-05 NOTE — Assessment & Plan Note (Signed)
Patient has been evaluated by ED they did a respiratory panel which was negative for flu and COVID and chest x-ray which came back negative patient is having intermittent fevers that is managed with over-the-counter treatments.  She is going get a new thermometer we did talk about appropriate technique about waiting at least 5 minutes after having needing to eat or drink to check her oral temperature.  Patient acknowledged.  She will continue to monitor her symptoms over the next couple days and send me information via MyChart towards of the week if she is not improving we can always consider antibiotic.  She normally requires doxycycline continue to monitor

## 2021-03-07 ENCOUNTER — Telehealth: Payer: Self-pay | Admitting: Nurse Practitioner

## 2021-03-07 ENCOUNTER — Encounter: Payer: Self-pay | Admitting: Internal Medicine

## 2021-03-07 ENCOUNTER — Other Ambulatory Visit: Payer: Self-pay

## 2021-03-07 ENCOUNTER — Ambulatory Visit (INDEPENDENT_AMBULATORY_CARE_PROVIDER_SITE_OTHER): Payer: Medicare Other | Admitting: Internal Medicine

## 2021-03-07 DIAGNOSIS — J209 Acute bronchitis, unspecified: Secondary | ICD-10-CM

## 2021-03-07 DIAGNOSIS — I2510 Atherosclerotic heart disease of native coronary artery without angina pectoris: Secondary | ICD-10-CM | POA: Diagnosis not present

## 2021-03-07 MED ORDER — DOXYCYCLINE HYCLATE 100 MG PO TABS: 100.0000 mg | ORAL_TABLET | Freq: Two times a day (BID) | ORAL | 0 refills | Status: DC

## 2021-03-07 NOTE — Telephone Encounter (Signed)
Pt called stating  that there is no sign of the urine results. Pt states she would like a call back to discuss her visit.

## 2021-03-07 NOTE — Assessment & Plan Note (Signed)
Acute Not improving - cough is getting worse and there is mucus in her chest W/u to date with neg covid/flu tests, cxr neg Since symptoms are progressing will start abx- doxycycline 100 mg bid x 10 days Continue cough syrups Rest, fluids She will update me with her symptoms

## 2021-03-07 NOTE — Telephone Encounter (Signed)
Advised patient that we collected urine sample just in case but we did not need to check it. Patient verbalized understanding.

## 2021-03-07 NOTE — Progress Notes (Signed)
Subjective:    Patient ID: [name], female    DOB: 1932/10/31, 87 y.o.   MRN: [id number]  This visit occurred during the SARS-CoV-2 public health emergency.  Safety protocols were in place, including screening questions prior to the visit, additional usage of staff PPE, and extensive cleaning of exam room while observing appropriate contact time as indicated for disinfecting solutions.    HPI The patient is here for an acute visit.  Her symptoms started about one week ago.  She had a dry cough and nasal congestion.  She had a low grade temp at times.  Monday night her temp was 102.  Her cough has developed into a productive cough.  She feels horrible.     She is flu, covid negative.    She has been taking delsym which helps during the day and the hycodan at night   Medications and allergies reviewed with patient and updated if appropriate.  Patient Active Problem List   Diagnosis Date Noted   Fever 03/05/2021   Family history of colon cancer 10/20/2020   Abnormal CT of the abdomen 10/20/2020   Pneumatosis intestinalis 10/20/2020   Secondary hypercoagulable state ([place]) 10/04/2020   URI (upper respiratory infection) 09/12/2020   Acute on chronic congestive heart failure with left ventricular diastolic dysfunction ([place]) 07/13/2020   Aortic atherosclerosis ([place]) 01/06/2020   Cough 12/22/2019   Palpitations 12/07/2019   Atrial fibrillation ([place]) 12/07/2019   PUD (peptic ulcer disease) 12/07/2019   Dyspnea on exertion 12/07/2019   Eczema 07/27/2019   CAD (coronary artery disease) 06/23/2019   NSTEMI (non-ST elevated myocardial infarction) ([place])    Iron deficiency 12/26/2018   Anemia 12/26/2018   B12 deficiency 12/23/2018   Discoloration of skin of finger 11/11/2018   Chronic gastric ulcer without hemorrhage and without perforation 06/05/2018   Constipation, chronic 06/05/2018   Callus of foot 03/04/2018   Hammertoes of both feet 03/04/2018   History of  peptic ulcer disease 12/21/2017   Hypertension 12/21/2017   Syncope 12/14/2017   Macrocytic anemia 12/14/2017   Near syncope 12/14/2017   Left sided numbness 10/19/2017   Small vessel disease, cerebrovascular 10/19/2017   Numbness and tingling of left arm and leg 10/13/2017   Anxiety 10/02/2017   Macular pucker, right eye 07/24/2017   Pseudophakia of both eyes 07/24/2017   Prediabetes 09/29/2016   Malignant neoplasm of upper lobe of right lung (HCC) 09/29/2016   Leg cramps 01/29/2016   Insomnia 09/12/2015   Rectal bleed 09/12/2015   S/P AVR 11/16/2014   Branch retinal vein occlusion 10/12/2013   Iris nevus, right 10/12/2013   Aortic valve stenosis, severe    Allergic rhinitis 09/10/2009   Hyperlipidemia 11/30/2006   Mitral valve disease 10/29/2006   Osteopenia 10/29/2006   History of right breast cancer 10/29/2006    Current Outpatient Medications on File Prior to Visit  Medication Sig Dispense Refill   acetaminophen (TYLENOL) 500 MG tablet Take 500-1,000 mg by mouth every 6 (six) hours as needed for mild pain or moderate pain.      ALPRAZolam (XANAX) 0.5 MG tablet TAKE 1 TABLET ONCE DAILY AS NEEDED FOR AN ANXIETY, DO NOT TAKE AT NIGHT. 90 tablet 0   apixaban (ELIQUIS) 5 MG TABS tablet Take 1 tablet (5 mg total) by mouth 2 (two) times daily. 120 tablet 2   Calcium Carb-Cholecalciferol 600-400 MG-UNIT TABS Take 1 tablet by mouth daily.     Chloral Hydrate CRYS Liquid 100 mg/ ml.  Take 10 mls PO HS prn 900 g 0   Cholecalciferol (VITAMIN D-3) 1000 UNITS CAPS Take 1,000 Units by mouth daily.      clindamycin (CLEOCIN) 300 MG capsule Take 600 mg by mouth See admin instructions. Take 600 mg 1 hour prior to dental work     ferrous sulfate 325 (65 FE) MG tablet Take 325 mg by mouth 2 (two) times a week. Every Monday and Wednesday     furosemide (LASIX) 20 MG tablet Take 0.5 tablets (10 mg total) by mouth daily. You may take 1 extra tablet daily for weight gain of 3 pounds. 45 tablet 3    HYDROcodone bit-homatropine (HYCODAN) 5-1.5 MG/5ML syrup Take 5 mLs by mouth every 8 (eight) hours as needed for cough. 120 mL 0   ipratropium (ATROVENT) 0.06 % nasal spray Place 2 sprays into both nostrils in the morning and at bedtime.     levocetirizine (XYZAL) 5 MG tablet Take 5 mg by mouth at bedtime as needed for allergies.      metoprolol succinate (TOPROL XL) 25 MG 24 hr tablet Take 1 tablet (25 mg total) by mouth daily. 90 tablet 3   montelukast (SINGULAIR) 10 MG tablet TAKE 1 TABLET EACH DAY. 90 tablet 1   nitroGLYCERIN (NITROSTAT) 0.4 MG SL tablet Place 1 tablet (0.4 mg total) under the tongue every 5 (five) minutes as needed for chest pain (CP or SOB). 25 tablet 3   Ophthalmic Irrigation Solution (OCUSOFT EYE [place] OP) Place 1 application into both eyes at bedtime.     OVER THE COUNTER MEDICATION Apply 1 application topically daily as needed (pain). Hemp Cream     pantoprazole (PROTONIX) 40 MG tablet TAKE 1 TABLET BY MOUTH TWICE DAILY. 180 tablet 3   Polyethyl Glycol-Propyl Glycol (SYSTANE ULTRA) 0.4-0.3 % SOLN Place 1 drop into both eyes daily as needed (dry eyes).     polyethylene glycol (MIRALAX / GLYCOLAX) 17 g packet Take 17 g by mouth daily.     potassium chloride (KLOR-CON) 10 MEQ tablet TAKE 1 TABLET BY MOUTH DAILY. 90 tablet 3   rosuvastatin (CRESTOR) 20 MG tablet TAKE 1 TABLET ONCE DAILY. 90 tablet 3   sertraline (ZOLOFT) 25 MG tablet Take 1 tablet (25 mg total) by mouth daily. 30 tablet 3   tretinoin (RETIN-A) 0.1 % cream Apply 1 application topically every Monday, Wednesday, and Friday.      No current facility-administered medications on file prior to visit.    Past Medical History:  Diagnosis Date   Allergy    Anemia    Anxiety    Aortic valve stenosis, severe    s/p AVR // mild to mod paravalvular leakage // Echo 9/21: EF 55-60, no RWMA, mild LVH, GR 2 DD, mildly reduced RVSF, RVSP 38.7, moderate LAE, mild RAE, AVR with mean gradient 11 mmHg, mild paravalvular  leakage   Arthritis    Breast cancer ([place]) 2007   right   Cataract    Constipation    Dysrhythmia    A-fib   Hiatal hernia    pt denies   Hypercholesteremia    Hypertension    Hypoglycemia    Osteopenia    Personal history of radiation therapy    Tingling    Ulcer 2012   bleeding gastric Ulcer    Past Surgical History:  Procedure Laterality Date   AORTIC VALVE REPLACEMENT N/A 11/16/2014   Procedure: AORTIC VALVE REPLACEMENT (AVR);  Surgeon: Gaye Pollack, MD;  Location: [place];  Service: Open Heart Surgery;  Laterality: N/A;   BIOPSY  12/15/2017   Procedure: BIOPSY;  Surgeon: Rush Landmark Telford Nab., MD;  Location: [place];  Service: Gastroenterology;;   BIOPSY  01/10/2021   Procedure: BIOPSY;  Surgeon: Irving Copas., MD;  Location: [place];  Service: Gastroenterology;;   BREAST BIOPSY     BREAST LUMPECTOMY Right    2005?   BREAST SURGERY     BRONCHIAL BIOPSY  03/06/2020   Procedure: BRONCHIAL BIOPSIES;  Surgeon: Collene Gobble, MD;  Location: [facility] ENDOSCOPY;  Service: Pulmonary;;   BRONCHIAL BRUSHINGS  03/06/2020   Procedure: BRONCHIAL BRUSHINGS;  Surgeon: Collene Gobble, MD;  Location: [facility]OSCOPY;  Service: Pulmonary;;   BRONCHIAL NEEDLE ASPIRATION BIOPSY  03/06/2020   Procedure: BRONCHIAL NEEDLE ASPIRATION BIOPSIES;  Surgeon: Collene Gobble, MD;  Location: [facility] ENDOSCOPY;  Service: Pulmonary;;   BRONCHIAL WASHINGS  03/06/2020   Procedure: BRONCHIAL WASHINGS;  Surgeon: Collene Gobble, MD;  Location: [facility] ENDOSCOPY;  Service: Pulmonary;;   CARDIAC CATHETERIZATION N/A 10/18/2014   Procedure: Right/Left Heart Cath and Coronary Angiography;  Surgeon: [name], MD;  Location: [facility];  Service: Cardiovascular;  Laterality: N/A;   CARDIOVERSION N/A 12/09/2019   Procedure: CARDIOVERSION;  Surgeon: Josue Hector, MD;  Location: [place];  Service: Cardiovascular;  Laterality: N/A;   CARDIOVERSION N/A 12/30/2019   Procedure: CARDIOVERSION;   Surgeon: Josue Hector, MD;  Location: [facility] ENDOSCOPY;  Service: Cardiovascular;  Laterality: N/A;   COLONOSCOPY WITH PROPOFOL N/A 01/10/2021   Procedure: COLONOSCOPY WITH PROPOFOL;  Surgeon: Irving Copas., MD;  Location: [place];  Service: Gastroenterology;  Laterality: N/A;   CORONARY ANGIOGRAPHY N/A 02/04/2019   Procedure: CORONARY ANGIOGRAPHY;  Surgeon: Nelva Bush, MD;  Location: [place] [facility];  Service: Cardiovascular;  Laterality: N/A;   COSMETIC SURGERY     face   ESOPHAGOGASTRODUODENOSCOPY (EGD) WITH PROPOFOL N/A 12/15/2017   Procedure: ESOPHAGOGASTRODUODENOSCOPY (EGD) WITH PROPOFOL;  Surgeon: Rush Landmark Telford Nab., MD;  Location: [place];  Service: Gastroenterology;  Laterality: N/A;   FIDUCIAL MARKER PLACEMENT  03/06/2020   Procedure: FIDUCIAL MARKER PLACEMENT;  Surgeon: Collene Gobble, MD;  Location: [facility] ENDOSCOPY;  Service: Pulmonary;;   PARATHYROIDECTOMY     TEE WITHOUT CARDIOVERSION N/A 11/16/2014   Procedure: TRANSESOPHAGEAL ECHOCARDIOGRAM (TEE);  Surgeon: Gaye Pollack, MD;  Location: [place];  Service: Open Heart Surgery;  Laterality: N/A;   TEE WITHOUT CARDIOVERSION N/A 12/09/2019   Procedure: TRANSESOPHAGEAL ECHOCARDIOGRAM (TEE);  Surgeon: Josue Hector, MD;  Location: [facility] ENDOSCOPY;  Service: Cardiovascular;  Laterality: N/A;   TONSILLECTOMY     VIDEO BRONCHOSCOPY WITH ENDOBRONCHIAL NAVIGATION Right 03/06/2020   Procedure: VIDEO BRONCHOSCOPY WITH ENDOBRONCHIAL NAVIGATION;  Surgeon: Collene Gobble, MD;  Location: [facility] ENDOSCOPY;  Service: Pulmonary;  Laterality: Right;    Social History   Socioeconomic History   Marital status: Married    Spouse name: Not on file   Number of children: 3   Years of education: 16   Highest education level: Bachelor's degree (e.g., BA, AB, BS)  Occupational History   Occupation: Retired  Tobacco Use   Smoking status: Former    Packs/day: 0.50    Years: 15.00    Pack years: 7.50    Types: Cigarettes     Start date: 1971    Quit date: 1986    Years since quitting: 36.8   Smokeless tobacco: Never   Tobacco comments:    quit smoking 1980  Vaping Use  Vaping Use: Never used  Substance and Sexual Activity   Alcohol use: Yes    Alcohol/week: 7.0 standard drinks    Types: 7 Glasses of wine per week    Comment: wine nightly with dinner   Drug use: No   Sexual activity: Not Currently  Other Topics Concern   Not on file  Social History Narrative   ** Merged History Encounter **       L[facility] with her husband. Right-handed. Caffeine use: 3-4 cups per day (some tea, mixes coffee with decaf).   Social Determinants of Health   Financial Resource Strain: Not on file  Food Insecurity: Not on file  Transportation Needs: Not on file  Physical Activity: Not on file  Stress: Not on file  Social Connections: Not on file    Family History  Problem Relation Age of Onset   Hypertension Mother    Rectal cancer Mother    Colon cancer Mother    Kidney disease Father    Cancer Cousin 70       breast and ovarian    Esophageal cancer Neg Hx    Stomach cancer Neg Hx    Colon polyps Neg Hx     Review of Systems  Constitutional:  Positive for appetite change, fatigue and fever.  HENT:  Positive for congestion. Negative for ear pain, sinus pain and sore throat.   Respiratory:  Positive for cough, shortness of breath and wheezing.   Cardiovascular:  Negative for chest pain.  Gastrointestinal:  Negative for abdominal pain, diarrhea and nausea.  Musculoskeletal:  Positive for myalgias (legs ache when walking).  Neurological:  Positive for dizziness and light-headedness. Negative for headaches.      Objective:   Vitals:   03/07/21 1110  BP: 128/70  Pulse: 95  Temp: 97.9 F (36.6 C)  SpO2: 99%   BP Readings from Last 3 Encounters:  03/07/21 128/70  03/05/21 100/68  03/02/21 118/70   Wt Readings from Last 3 Encounters:  03/07/21 162 lb (73.5 kg)  03/05/21 163 lb 6 oz  (74.1 kg)  02/07/21 162 lb 6.4 oz (73.7 kg)   Body mass index is 26.96 kg/m.   Physical Exam    GENERAL APPEARANCE: Appears stated age, mildly illl appearing, NAD EYES: conjunctiva clear, no icterus HENT: bilateral tympanic membranes and ear canals normal, oropharynx with no erythema or exudates, trachea midline, no cervical or supraclavicular lymphadenopathy LUNGS: Unlabored breathing, good air entry bilaterally, clear to auscultation without wheeze or crackles CARDIOVASCULAR: Normal S1,S2 , no edema SKIN: Warm, dry      Assessment & Plan:    See Problem List for Assessment and Plan of chronic medical problems.

## 2021-03-07 NOTE — Patient Instructions (Addendum)
    Medications changes include :   doxycycline 100 mg twice daily    Your prescription(s) have been submitted to your pharmacy. Please take as directed and contact our office if you believe you are having problem(s) with the medication(s).

## 2021-03-18 ENCOUNTER — Other Ambulatory Visit: Payer: Self-pay | Admitting: Internal Medicine

## 2021-03-29 ENCOUNTER — Encounter: Payer: Self-pay | Admitting: Internal Medicine

## 2021-03-30 ENCOUNTER — Emergency Department (HOSPITAL_BASED_OUTPATIENT_CLINIC_OR_DEPARTMENT_OTHER): Payer: Medicare Other | Admitting: Radiology

## 2021-03-30 ENCOUNTER — Encounter (HOSPITAL_BASED_OUTPATIENT_CLINIC_OR_DEPARTMENT_OTHER): Payer: Self-pay

## 2021-03-30 ENCOUNTER — Encounter: Payer: Self-pay | Admitting: Internal Medicine

## 2021-03-30 ENCOUNTER — Emergency Department (HOSPITAL_BASED_OUTPATIENT_CLINIC_OR_DEPARTMENT_OTHER)
Admission: EM | Admit: 2021-03-30 | Discharge: 2021-03-30 | Disposition: A | Discharge status: Home / Self Care | Payer: Medicare Other | Attending: Emergency Medicine | Admitting: Emergency Medicine

## 2021-03-30 ENCOUNTER — Other Ambulatory Visit: Payer: Self-pay

## 2021-03-30 DIAGNOSIS — Z79899 Other long term (current) drug therapy: Secondary | ICD-10-CM | POA: Insufficient documentation

## 2021-03-30 DIAGNOSIS — I110 Hypertensive heart disease with heart failure: Secondary | ICD-10-CM | POA: Diagnosis not present

## 2021-03-30 DIAGNOSIS — J40 Bronchitis, not specified as acute or chronic: Secondary | ICD-10-CM | POA: Diagnosis not present

## 2021-03-30 DIAGNOSIS — Z853 Personal history of malignant neoplasm of breast: Secondary | ICD-10-CM | POA: Diagnosis not present

## 2021-03-30 DIAGNOSIS — Z87891 Personal history of nicotine dependence: Secondary | ICD-10-CM | POA: Insufficient documentation

## 2021-03-30 DIAGNOSIS — Z20822 Contact with and (suspected) exposure to covid-19: Secondary | ICD-10-CM | POA: Insufficient documentation

## 2021-03-30 DIAGNOSIS — I2510 Atherosclerotic heart disease of native coronary artery without angina pectoris: Secondary | ICD-10-CM | POA: Diagnosis not present

## 2021-03-30 DIAGNOSIS — I509 Heart failure, unspecified: Secondary | ICD-10-CM | POA: Diagnosis not present

## 2021-03-30 DIAGNOSIS — R059 Cough, unspecified: Secondary | ICD-10-CM | POA: Diagnosis not present

## 2021-03-30 DIAGNOSIS — Z7901 Long term (current) use of anticoagulants: Secondary | ICD-10-CM | POA: Diagnosis not present

## 2021-03-30 DIAGNOSIS — I517 Cardiomegaly: Secondary | ICD-10-CM | POA: Diagnosis not present

## 2021-03-30 LAB — RESP PANEL BY RT-PCR (FLU A&B, COVID) ARPGX2
Influenza A by PCR: NEGATIVE
Influenza B by PCR: NEGATIVE
SARS Coronavirus 2 by RT PCR: NEGATIVE

## 2021-03-30 MED ORDER — LEVOFLOXACIN 750 MG PO TABS: 750.0000 mg | ORAL_TABLET | Freq: Every day | ORAL | 0 refills | Status: AC

## 2021-03-30 MED ORDER — BENZONATATE 100 MG PO CAPS: 100.0000 mg | ORAL_CAPSULE | Freq: Three times a day (TID) | ORAL | 0 refills | Status: DC

## 2021-03-30 NOTE — Discharge Instructions (Addendum)
You were seen in the emergency department today for cough after viral upper respiratory infection.  We are sending you home on an antibiotic called Levaquin.  You will take this once a day for the next 7 days.  Have also prescribed you a cough medication called Tessalon that you can use every 8 hours for your symptoms.  Like we discussed at bedside you can continue to use Mucinex.  Please follow-up with your primary care provider at the conclusion of your antibiotic regimen.  You may also return to the emergency department should you have increasing shortness of breath.

## 2021-03-30 NOTE — ED Notes (Signed)
Pt refused vital signs, states she is ready to go.Dc instructions reviewed with patient. Patient voiced understanding. Dc with belongings.

## 2021-03-30 NOTE — ED Triage Notes (Signed)
Patient here POV from Home with Cough.  Mildly Productive and began approximately 3 days PTA. No Fevers.   Ambulatory. A&Ox4. GCS 15. NAD Noted during Triage.

## 2021-03-30 NOTE — ED Notes (Signed)
RT Note: Pt. ambulated around dept. on room air, results are as follows: Start: Pulse-88  O2  Sat- 96% During: Pulse-96  O2  Sat- 97% Completion: Pulse-116  Sat- 97% Pt. tolerated well with no c/o.

## 2021-03-30 NOTE — ED Provider Notes (Signed)
[facility] EMERGENCY DEPT Provider Note   CSN: [id number] Arrival date & time: 03/30/21  1146     History Chief Complaint  Patient presents with   Cough    [name] is a 87 y.o. female.  With past medical history of breast cancer, hypertension, atrial fibrillation, coronary artery disease, right upper lobe lung cancer who presents to the emergency department with cough.  Patient states that for 6 weeks she has had intermittent cough.  She states that she initially had cough without fever.  She was seen in the emergency department on 03/02/2021 and diagnosed with viral upper respiratory infection.  At the time she was negative for COVID and flu.  She had a chest x-ray which was negative for pneumonia.  It appears that she followed up with her primary care provider on 03/07/2021 for bronchitis and ongoing cough.  At that time they gave her a course of doxycycline.  She states that her symptoms went away for 2 weeks and are back now.  She states that her cough feels "mucousy."  She states that she started taking Mucinex yesterday and states that it "works pretty well."  She denies any fevers, nausea, vomiting, diarrhea, sore throat, sinus pain or pressure, chest pain or palpitations, lower extremity swelling.  She states that she has atrial fibrillation which is chronic and has a baseline shortness of breath with exertion.  She states is not increased from normal.  She denies any hemoptysis.   Cough Associated symptoms: no chest pain, no fever, no headaches and no shortness of breath       Past Medical History:  Diagnosis Date   Allergy    Anemia    Anxiety    Aortic valve stenosis, severe    s/p AVR // mild to mod paravalvular leakage // Echo 9/21: EF 55-60, no RWMA, mild LVH, GR 2 DD, mildly reduced RVSF, RVSP 38.7, moderate LAE, mild RAE, AVR with mean gradient 11 mmHg, mild paravalvular leakage   Arthritis    Breast cancer ([place]) 2007   right   Cataract     Constipation    Dysrhythmia    A-fib   Hiatal hernia    pt denies   Hypercholesteremia    Hypertension    Hypoglycemia    Osteopenia    Personal history of radiation therapy    Tingling    Ulcer 2012   bleeding gastric Ulcer    Patient Active Problem List   Diagnosis Date Noted   Fever 03/05/2021   Family history of colon cancer 10/20/2020   Abnormal CT of the abdomen 10/20/2020   Pneumatosis intestinalis 10/20/2020   Secondary hypercoagulable state ([place]) 10/04/2020   Acute bronchitis 09/12/2020   Acute on chronic congestive heart failure with left ventricular diastolic dysfunction ([place]) 07/13/2020   Aortic atherosclerosis ([place]) 01/06/2020   Cough 12/22/2019   Palpitations 12/07/2019   Atrial fibrillation ([place]) 12/07/2019   PUD (peptic ulcer disease) 12/07/2019   Dyspnea on exertion 12/07/2019   Eczema 07/27/2019   CAD (coronary artery disease) 06/23/2019   NSTEMI (non-ST elevated myocardial infarction) ([place])    Iron deficiency 12/26/2018   Anemia 12/26/2018   B12 deficiency 12/23/2018   Discoloration of skin of finger 11/11/2018   Chronic gastric ulcer without hemorrhage and without perforation 06/05/2018   Constipation, chronic 06/05/2018   Callus of foot 03/04/2018   Hammertoes of both feet 03/04/2018   History of peptic ulcer disease 12/21/2017   Hypertension 12/21/2017   Syncope 12/14/2017  Macrocytic anemia 12/14/2017   Near syncope 12/14/2017   Left sided numbness 10/19/2017   Small vessel disease, cerebrovascular 10/19/2017   Numbness and tingling of left arm and leg 10/13/2017   Anxiety 10/02/2017   Macular pucker, right eye 07/24/2017   Pseudophakia of both eyes 07/24/2017   Prediabetes 09/29/2016   Malignant neoplasm of upper lobe of right lung ([place]) 09/29/2016   Leg cramps 01/29/2016   Insomnia 09/12/2015   Rectal bleed 09/12/2015   S/P AVR 11/16/2014   Branch retinal vein occlusion 10/12/2013   Iris nevus, right 10/12/2013   Aortic valve  stenosis, severe    Allergic rhinitis 09/10/2009   Hyperlipidemia 11/30/2006   Mitral valve disease 10/29/2006   Osteopenia 10/29/2006   History of right breast cancer 10/29/2006    Past Surgical History:  Procedure Laterality Date   AORTIC VALVE REPLACEMENT N/A 11/16/2014   Procedure: AORTIC VALVE REPLACEMENT (AVR);  Surgeon: Gaye Pollack, MD;  Location: [facility];  Service: Open Heart Surgery;  Laterality: N/A;   BIOPSY  12/15/2017   Procedure: BIOPSY;  Surgeon: Rush Landmark Telford Nab., MD;  Location: [place];  Service: Gastroenterology;;   BIOPSY  01/10/2021   Procedure: BIOPSY;  Surgeon: Irving Copas., MD;  Location: [facility];  Service: Gastroenterology;;   BREAST BIOPSY     BREAST LUMPECTOMY Right    2005?   BREAST SURGERY     BRONCHIAL BIOPSY  03/06/2020   Procedure: BRONCHIAL BIOPSIES;  Surgeon: Collene Gobble, MD;  Location: [facility] ENDOSCOPY;  Service: Pulmonary;;   BRONCHIAL BRUSHINGS  03/06/2020   Procedure: BRONCHIAL BRUSHINGS;  Surgeon: Collene Gobble, MD;  Location: [facility] ENDOSCOPY;  Service: Pulmonary;;   BRONCHIAL NEEDLE ASPIRATION BIOPSY  03/06/2020   Procedure: BRONCHIAL NEEDLE ASPIRATION BIOPSIES;  Surgeon: Collene Gobble, MD;  Location: [facility] ENDOSCOPY;  Service: Pulmonary;;   BRONCHIAL WASHINGS  03/06/2020   Procedure: BRONCHIAL WASHINGS;  Surgeon: Collene Gobble, MD;  Location: [facility] ENDOSCOPY;  Service: Pulmonary;;   CARDIAC CATHETERIZATION N/A 10/18/2014   Procedure: Right/Left Heart Cath and Coronary Angiography;  Surgeon: [name], MD;  Location: [facility] CV LAB;  Service: Cardiovascular;  Laterality: N/A;   CARDIOVERSION N/A 12/09/2019   Procedure: CARDIOVERSION;  Surgeon: Josue Hector, MD;  Location: [place];  Service: Cardiovascular;  Laterality: N/A;   CARDIOVERSION N/A 12/30/2019   Procedure: CARDIOVERSION;  Surgeon: Josue Hector, MD;  Location: [facility] ENDOSCOPY;  Service: Cardiovascular;  Laterality: N/A;   COLONOSCOPY WITH PROPOFOL N/A  01/10/2021   Procedure: COLONOSCOPY WITH PROPOFOL;  Surgeon: Irving Copas., MD;  Location: [place];  Service: Gastroenterology;  Laterality: N/A;   CORONARY ANGIOGRAPHY N/A 02/04/2019   Procedure: CORONARY ANGIOGRAPHY;  Surgeon: Nelva Bush, MD;  Location: [place] CV LAB;  Service: Cardiovascular;  Laterality: N/A;   COSMETIC SURGERY     face   ESOPHAGOGASTRODUODENOSCOPY (EGD) WITH PROPOFOL N/A 12/15/2017   Procedure: ESOPHAGOGASTRODUODENOSCOPY (EGD) WITH PROPOFOL;  Surgeon: Rush Landmark Telford Nab., MD;  Location: [place];  Service: Gastroenterology;  Laterality: N/A;   FIDUCIAL MARKER PLACEMENT  03/06/2020   Procedure: FIDUCIAL MARKER PLACEMENT;  Surgeon: Collene Gobble, MD;  Location: [facility] ENDOSCOPY;  Service: Pulmonary;;   PARATHYROIDECTOMY     TEE WITHOUT CARDIOVERSION N/A 11/16/2014   Procedure: TRANSESOPHAGEAL ECHOCARDIOGRAM (TEE);  Surgeon: Gaye Pollack, MD;  Location: [place];  Service: Open Heart Surgery;  Laterality: N/A;   TEE WITHOUT CARDIOVERSION N/A 12/09/2019   Procedure: TRANSESOPHAGEAL ECHOCARDIOGRAM (TEE);  Surgeon: Josue Hector,  MD;  Location: [place];  Service: Cardiovascular;  Laterality: N/A;   TONSILLECTOMY     VIDEO BRONCHOSCOPY WITH ENDOBRONCHIAL NAVIGATION Right 03/06/2020   Procedure: VIDEO BRONCHOSCOPY WITH ENDOBRONCHIAL NAVIGATION;  Surgeon: Collene Gobble, MD;  Location: [facility] ENDOSCOPY;  Service: Pulmonary;  Laterality: Right;     OB History   No obstetric history on file.     Family History  Problem Relation Age of Onset   Hypertension Mother    Rectal cancer Mother    Colon cancer Mother    Kidney disease Father    Cancer Cousin 41       breast and ovarian    Esophageal cancer Neg Hx    Stomach cancer Neg Hx    Colon polyps Neg Hx     Social History   Tobacco Use   Smoking status: Former    Packs/day: 0.50    Years: 15.00    Pack years: 7.50    Types: Cigarettes    Start date: 68    Quit date: 1986    Years  since quitting: 36.9   Smokeless tobacco: Never   Tobacco comments:    quit smoking 1980  Vaping Use   Vaping Use: Never used  Substance Use Topics   Alcohol use: Yes    Alcohol/week: 7.0 standard drinks    Types: 7 Glasses of wine per week    Comment: wine nightly with dinner   Drug use: No    Home Medications Prior to Admission medications   Medication Sig Start Date End Date Taking? Authorizing Provider  acetaminophen (TYLENOL) 500 MG tablet Take 500-1,000 mg by mouth every 6 (six) hours as needed for mild pain or moderate pain.     [provider]  ALPRAZolam (XANAX) 0.5 MG tablet TAKE 1 TABLET ONCE DAILY AS NEEDED FOR AN ANXIETY, DO NOT TAKE AT NIGHT. 01/29/21   [name], MD  apixaban (ELIQUIS) 5 MG TABS tablet Take 1 tablet (5 mg total) by mouth 2 (two) times daily. 02/07/21   [name], MD  Calcium Carb-Cholecalciferol 600-400 MG-UNIT TABS Take 1 tablet by mouth daily.    [provider]  Chloral Hydrate CRYS Liquid 100 mg/ ml.    Take 10 mls PO HS prn 01/05/21   [name], MD  Cholecalciferol (VITAMIN D-3) 1000 UNITS CAPS Take 1,000 Units by mouth daily.     [provider]  clindamycin (CLEOCIN) 300 MG capsule Take 600 mg by mouth See admin instructions. Take 600 mg 1 hour prior to dental work    [provider]  doxycycline (VIBRA-TABS) 100 MG tablet Take 1 tablet (100 mg total) by mouth 2 (two) times daily. 03/07/21   [name], MD  ferrous sulfate 325 (65 FE) MG tablet Take 325 mg by mouth 2 (two) times a week. Every Monday and Wednesday    [provider]  furosemide (LASIX) 20 MG tablet Take 0.5 tablets (10 mg total) by mouth daily. You may take 1 extra tablet daily for weight gain of 3 pounds. 10/11/20 10/06/21  [name], MD  HYDROcodone bit-homatropine (HYCODAN) 5-1.5 MG/5ML syrup Take 5 mLs by mouth every 8 (eight) hours as needed for cough. 03/18/21   [name], MD  ipratropium (ATROVENT) 0.06 %  nasal spray Place 2 sprays into both nostrils in the morning and at bedtime. 08/09/20   [provider]  levocetirizine (XYZAL) 5 MG tablet Take 5 mg by mouth at bedtime as needed  for allergies.  10/11/18   [provider]  metoprolol succinate (TOPROL XL) 25 MG 24 hr tablet Take 1 tablet (25 mg total) by mouth daily. 02/01/21   [name], MD  montelukast (SINGULAIR) 10 MG tablet TAKE 1 TABLET EACH DAY. 05/28/20   Burns, Claudina Lick, MD  nitroGLYCERIN (NITROSTAT) 0.4 MG SL tablet Place 1 tablet (0.4 mg total) under the tongue every 5 (five) minutes as needed for chest pain (CP or SOB). 02/05/19   Duke, Tami Lin, PA  Ophthalmic Irrigation Solution (OCUSOFT EYE [place] OP) Place 1 application into both eyes at bedtime.    [provider]  OVER THE COUNTER MEDICATION Apply 1 application topically daily as needed (pain). Hemp Cream    [provider]  pantoprazole (PROTONIX) 40 MG tablet TAKE 1 TABLET BY MOUTH TWICE DAILY. 11/06/20   Mansouraty, Telford Nab., MD  Polyethyl Glycol-Propyl Glycol (SYSTANE ULTRA) 0.4-0.3 % SOLN Place 1 drop into both eyes daily as needed (dry eyes).    [provider]  polyethylene glycol (MIRALAX / GLYCOLAX) 17 g packet Take 17 g by mouth daily.    [provider]  potassium chloride (KLOR-CON) 10 MEQ tablet TAKE 1 TABLET BY MOUTH DAILY. 01/29/21   [name], MD  rosuvastatin (CRESTOR) 20 MG tablet TAKE 1 TABLET ONCE DAILY. 08/17/20   [name], MD  sertraline (ZOLOFT) 25 MG tablet Take 1 tablet (25 mg total) by mouth daily. 02/07/21   [name], MD  tretinoin (RETIN-A) 0.1 % cream Apply 1 application topically every Monday, Wednesday, and Friday.  01/18/09   [provider]    Allergies    Lactose intolerance (gi), Amoxicillin-pot clavulanate, Penicillins, Sulfa antibiotics, and Sulfonamide derivatives  Review of Systems   Review of Systems  Constitutional:  Negative for fatigue and fever.   HENT:  Positive for postnasal drip.   Respiratory:  Positive for cough. Negative for shortness of breath.   Cardiovascular:  Negative for chest pain, palpitations and leg swelling.  Gastrointestinal:  Negative for nausea and vomiting.  Neurological:  Negative for dizziness and headaches.  All other systems reviewed and are negative.  Physical Exam Updated Vital Signs BP 127/77   Pulse 80   Temp (!) 96.8 F (36 C)   Resp 20   Ht 5\' 5"  (1.651 m)   Wt 70.8 kg   SpO2 100%   BMI 25.96 kg/m   Physical Exam Vitals and nursing note reviewed.  Constitutional:      General: She is not in acute distress.    Appearance: Normal appearance. She is normal weight. She is not ill-appearing or toxic-appearing.  HENT:     Head: Normocephalic and atraumatic.     Nose: Rhinorrhea present.     Mouth/Throat:     Lips: Pink.     Mouth: Mucous membranes are moist.     Pharynx: Oropharynx is clear.     Tonsils: No tonsillar exudate or tonsillar abscesses. 0 on the right. 0 on the left.  Eyes:     General: No scleral icterus.    Extraocular Movements: Extraocular movements intact.     Pupils: Pupils are equal, round, and reactive to light.  Cardiovascular:     Rate and Rhythm: Normal rate and regular rhythm.     Pulses: Normal pulses.     Heart sounds: No murmur heard. Pulmonary:     Effort: Pulmonary effort is normal. No respiratory distress.     Breath sounds: Normal breath sounds.  No wheezing, rhonchi or rales.  Abdominal:     General: Bowel sounds are normal. There is no distension.     Palpations: Abdomen is soft.     Tenderness: There is no abdominal tenderness.  Musculoskeletal:        General: Normal range of motion.     Cervical back: Normal range of motion.  Skin:    General: Skin is warm and dry.     Capillary Refill: Capillary refill takes less than 2 seconds.  Neurological:     General: No focal deficit present.     Mental Status: She is alert and oriented to person,  place, and time. Mental status is at baseline.  Psychiatric:        Mood and Affect: Mood normal.        Behavior: Behavior normal.        Thought Content: Thought content normal.        Judgment: Judgment normal.    ED Results / Procedures / Treatments   Labs (all labs ordered are listed, but only abnormal results are displayed) Labs Reviewed  RESP PANEL BY RT-PCR (FLU A&B, COVID) [id number]   EKG None  Radiology DG Chest Port 1 View  Result Date: 03/30/2021 CLINICAL DATA:  Cough, rule out pneumonia EXAM: PORTABLE CHEST 1 VIEW COMPARISON:  03/02/2021 FINDINGS: Cardiomegaly status post median sternotomy and CABG. Bandlike scarring and consolidation of the right upper lobe with fiducial markers. No acute airspace opacity. Osseous structures are unremarkable. IMPRESSION: 1. No acute abnormality of the lungs. 2. Cardiomegaly. 3. Bandlike scarring and consolidation of the right upper lobe with fiducial markers, consistent with sequelae of radiation therapy. Electronically Signed   By: Delanna Ahmadi M.D.   On: 03/30/2021 12:53    Procedures Procedures   Medications Ordered in ED Medications - No data to display  ED Course  I have reviewed the triage vital signs and the nursing notes.  Pertinent labs & imaging results that were available during my care of the patient were reviewed by me and considered in my medical decision making (see chart for details).    MDM Rules/Calculators/A&P 87 year old female who presents emergency department with cough.  Her presentation is consistent with bronchitis.  Based on her history and physical exam I doubt sinusitis.  COVID and flu negative.  I do not suspect underlying cardiopulmonary process.  Considered but think unlikely, dangerous causes of this patient symptoms to include ACS, CHF or COPD exacerbation. Chest x-ray without evidence of pneumonia or pneumothorax.  Patient is nontoxic-appearing and not need of emergent medical intervention at this  time. She has failed doxycycline outpatient Will prescribe her Levaquin for 7-day course to cover for bacterial infection Also giving prescription for Tessalon.  She is instructed to follow-up with primary care at the end of her Levaquin course.  She is also instructed to return to the emergency department should she have new high fever or worsening shortness of breath.  She verbalizes understanding.  Safe for discharge. Final Clinical Impression(s) / ED Diagnoses Final diagnoses:  Bronchitis    Rx / DC Orders ED Discharge Orders          Ordered    levofloxacin (LEVAQUIN) 750 MG tablet  Daily        03/30/21 1413    benzonatate (TESSALON) 100 MG capsule  Every 8 hours        03/30/21 1413             Mickie Hillier,  PA-C 03/30/21 1814    Pattricia Boss, MD 04/02/21 1207

## 2021-04-04 ENCOUNTER — Ambulatory Visit (HOSPITAL_COMMUNITY)
Admission: RE | Admit: 2021-04-04 | Discharge: 2021-04-04 | Disposition: A | Discharge status: Home / Self Care | Payer: Medicare Other | Source: Ambulatory Visit | Attending: Physician Assistant | Admitting: Physician Assistant

## 2021-04-04 ENCOUNTER — Encounter (HOSPITAL_COMMUNITY): Payer: Self-pay | Admitting: Physician Assistant

## 2021-04-04 ENCOUNTER — Other Ambulatory Visit: Payer: Self-pay

## 2021-04-04 VITALS — BP 104/74 | HR 84 | Ht 65.0 in | Wt 159.4 lb

## 2021-04-04 DIAGNOSIS — I2510 Atherosclerotic heart disease of native coronary artery without angina pectoris: Secondary | ICD-10-CM | POA: Insufficient documentation

## 2021-04-04 DIAGNOSIS — D6869 Other thrombophilia: Secondary | ICD-10-CM | POA: Insufficient documentation

## 2021-04-04 DIAGNOSIS — I110 Hypertensive heart disease with heart failure: Secondary | ICD-10-CM | POA: Insufficient documentation

## 2021-04-04 DIAGNOSIS — Z79899 Other long term (current) drug therapy: Secondary | ICD-10-CM | POA: Diagnosis not present

## 2021-04-04 DIAGNOSIS — E785 Hyperlipidemia, unspecified: Secondary | ICD-10-CM | POA: Insufficient documentation

## 2021-04-04 DIAGNOSIS — Z7901 Long term (current) use of anticoagulants: Secondary | ICD-10-CM | POA: Insufficient documentation

## 2021-04-04 DIAGNOSIS — I4821 Permanent atrial fibrillation: Secondary | ICD-10-CM | POA: Insufficient documentation

## 2021-04-04 DIAGNOSIS — Q231 Congenital insufficiency of aortic valve: Secondary | ICD-10-CM | POA: Diagnosis not present

## 2021-04-04 DIAGNOSIS — I5032 Chronic diastolic (congestive) heart failure: Secondary | ICD-10-CM | POA: Diagnosis not present

## 2021-04-04 MED ORDER — APIXABAN 5 MG PO TABS: 5.0000 mg | ORAL_TABLET | Freq: Two times a day (BID) | ORAL | 3 refills | Status: DC

## 2021-04-04 NOTE — Progress Notes (Signed)
Primary Care Physician: [name], MD Primary Cardiologist: Dr Burt Knack Primary Electrophysiologist: none Referring Physician: Dr Earnestine Leys [name] is a 87 y.o. female with a history of bicuspid aortic valve s/p AVR 2016, CAD, HTN, HLD, chronic diastolic CHF, atrial fibrillation who presents for follow up in the [facility]. Patient has been maintained on amiodarone for rhythm control. She is on Eliquis for a CHADS2VASC score of 6. Patient was found to be back in afib at PCP office visit on 09/12/20. ECG reviewed by Dr Burt Knack who confirmed afib. Her amiodarone was increased and she converted to SR. She also increased her Lasix for 2 days as she had noted 4 lbs weight gain.   On follow up today, patient seen by Dr Burt Knack on 02/01/21 and found to be back in afib. She was minimally symptomatic and the decision was made to pursue rate control. Patient reports that she occasionally gets SOB when walking up an incline but otherwise feels well. She denies any bleeding issues on anticoagulation.   Today, she denies symptoms of palpitations, chest pain, shortness of breath, orthopnea, PND, lower extremity edema, dizziness, presyncope, syncope, snoring, daytime somnolence, bleeding, or neurologic sequela. The patient is tolerating medications without difficulties and is otherwise without complaint today.    Atrial Fibrillation Risk Factors:  she does not have symptoms or diagnosis of sleep apnea. she does not have a history of rheumatic fever.   she has a BMI of Body mass index is 26.53 kg/m.Marland Kitchen Filed Weights   04/04/21 1014  Weight: 72.3 kg     Family History  Problem Relation Age of Onset   Hypertension Mother    Rectal cancer Mother    Colon cancer Mother    Kidney disease Father    Cancer Cousin 20       breast and ovarian    Esophageal cancer Neg Hx    Stomach cancer Neg Hx    Colon polyps Neg Hx      Atrial Fibrillation Management  history:  Previous antiarrhythmic drugs: amiodarone  Previous cardioversions: 12/30/19 Previous ablations: none CHADS2VASC score: 6 Anticoagulation history: Eliquis   Past Medical History:  Diagnosis Date   Allergy    Anemia    Anxiety    Aortic valve stenosis, severe    s/p AVR // mild to mod paravalvular leakage // Echo 9/21: EF 55-60, no RWMA, mild LVH, GR 2 DD, mildly reduced RVSF, RVSP 38.7, moderate LAE, mild RAE, AVR with mean gradient 11 mmHg, mild paravalvular leakage   Arthritis    Breast cancer ([place]) 2007   right   Cataract    Constipation    Dysrhythmia    A-fib   Hiatal hernia    pt denies   Hypercholesteremia    Hypertension    Hypoglycemia    Osteopenia    Personal history of radiation therapy    Tingling    Ulcer 2012   bleeding gastric Ulcer   Past Surgical History:  Procedure Laterality Date   AORTIC VALVE REPLACEMENT N/A 11/16/2014   Procedure: AORTIC VALVE REPLACEMENT (AVR);  Surgeon: Gaye Pollack, MD;  Location: [place];  Service: Open Heart Surgery;  Laterality: N/A;   BIOPSY  12/15/2017   Procedure: BIOPSY;  Surgeon: Rush Landmark Telford Nab., MD;  Location: [place];  Service: Gastroenterology;;   BIOPSY  01/10/2021   Procedure: BIOPSY;  Surgeon: Irving Copas., MD;  Location: [place];  Service: Gastroenterology;;   BREAST  BIOPSY     BREAST LUMPECTOMY Right    2005?   BREAST SURGERY     BRONCHIAL BIOPSY  03/06/2020   Procedure: BRONCHIAL BIOPSIES;  Surgeon: Collene Gobble, MD;  Location[facility]COPY;  Service: Pulmonary;;   BRONCHIAL BRUSHINGS  03/06/2020   Procedure: BRONCHIAL BRUSHINGS;  Surgeon: Collene Gobble, MD;  Location: [facility] ENDOSCOPY;  Service: Pulmonary;;   BRONCHIAL NEEDLE ASPIRATION BIOPSY  03/06/2020   Procedure: BRONCHIAL NEEDLE ASPIRATION BIOPSIES;  Surgeon: Collene Gobble, MD;  Location: [facility] ENDOSCOPY;  Service: Pulmonary;;   BRONCHIAL WASHINGS  03/06/2020   Procedure: BRONCHIAL WASHINGS;  Surgeon: Collene Gobble,  MD;  Location: [facility] ENDOSCOPY;  Service: Pulmonary;;   CARDIAC CATHETERIZATION N/A 10/18/2014   Procedure: Right/Left Heart Cath and Coronary Angiography;  Surgeon: [name], MD;  Location: [place] CV LAB;  Service: Cardiovascular;  Laterality: N/A;   CARDIOVERSION N/A 12/09/2019   Procedure: CARDIOVERSION;  Surgeon: Josue Hector, MD;  Location: [place];  Service: Cardiovascular;  Laterality: N/A;   CARDIOVERSION N/A 12/30/2019   Procedure: CARDIOVERSION;  Surgeon: Josue Hector, MD;  Location: [facility] ENDOSCOPY;  Service: Cardiovascular;  Laterality: N/A;   COLONOSCOPY WITH PROPOFOL N/A 01/10/2021   Procedure: COLONOSCOPY WITH PROPOFOL;  Surgeon: Irving Copas., MD;  Location: [place];  Service: Gastroenterology;  Laterality: N/A;   CORONARY ANGIOGRAPHY N/A 02/04/2019   Procedure: CORONARY ANGIOGRAPHY;  Surgeon: Nelva Bush, MD;  Location: [place] CV LAB;  Service: Cardiovascular;  Laterality: N/A;   COSMETIC SURGERY     face   ESOPHAGOGASTRODUODENOSCOPY (EGD) WITH PROPOFOL N/A 12/15/2017   Procedure: ESOPHAGOGASTRODUODENOSCOPY (EGD) WITH PROPOFOL;  Surgeon: Rush Landmark Telford Nab., MD;  Location: [place];  Service: Gastroenterology;  Laterality: N/A;   FIDUCIAL MARKER PLACEMENT  03/06/2020   Procedure: FIDUCIAL MARKER PLACEMENT;  Surgeon: Collene Gobble, MD;  Location: [facility] ENDOSCOPY;  Service: Pulmonary;;   PARATHYROIDECTOMY     TEE WITHOUT CARDIOVERSION N/A 11/16/2014   Procedure: TRANSESOPHAGEAL ECHOCARDIOGRAM (TEE);  Surgeon: Gaye Pollack, MD;  Location: [place];  Service: Open Heart Surgery;  Laterality: N/A;   TEE WITHOUT CARDIOVERSION N/A 12/09/2019   Procedure: TRANSESOPHAGEAL ECHOCARDIOGRAM (TEE);  Surgeon: Josue Hector, MD;  Location: [facility] ENDOSCOPY;  Service: Cardiovascular;  Laterality: N/A;   TONSILLECTOMY     VIDEO BRONCHOSCOPY WITH ENDOBRONCHIAL NAVIGATION Right 03/06/2020   Procedure: VIDEO BRONCHOSCOPY WITH ENDOBRONCHIAL NAVIGATION;  Surgeon:  Collene Gobble, MD;  Location: [facility] ENDOSCOPY;  Service: Pulmonary;  Laterality: Right;    Current Outpatient Medications  Medication Sig Dispense Refill   acetaminophen (TYLENOL) 500 MG tablet Take 500-1,000 mg by mouth every 6 (six) hours as needed for mild pain or moderate pain.      ALPRAZolam (XANAX) 0.5 MG tablet TAKE 1 TABLET ONCE DAILY AS NEEDED FOR AN ANXIETY, DO NOT TAKE AT NIGHT. 90 tablet 0   benzonatate (TESSALON) 100 MG capsule Take 1 capsule (100 mg total) by mouth every 8 (eight) hours. 21 capsule 0   Calcium Carb-Cholecalciferol 600-400 MG-UNIT TABS Take 1 tablet by mouth daily.     Chloral Hydrate CRYS Liquid 100 mg/ ml.    Take 10 mls PO HS prn 900 g 0   Cholecalciferol (VITAMIN D-3) 1000 UNITS CAPS Take 1,000 Units by mouth daily.      ferrous sulfate 325 (65 FE) MG tablet Take 325 mg by mouth 2 (two) times a week. Every Monday and Wednesday     furosemide (LASIX) 20 MG tablet Take 0.5  tablets (10 mg total) by mouth daily. You may take 1 extra tablet daily for weight gain of 3 pounds. 45 tablet 3   HYDROcodone bit-homatropine (HYCODAN) 5-1.5 MG/5ML syrup Take 5 mLs by mouth every 8 (eight) hours as needed for cough. 120 mL 0   ipratropium (ATROVENT) 0.06 % nasal spray Place 2 sprays into both nostrils in the morning and at bedtime.     levocetirizine (XYZAL) 5 MG tablet Take 5 mg by mouth at bedtime as needed for allergies.      levofloxacin (LEVAQUIN) 750 MG tablet Take 1 tablet (750 mg total) by mouth daily for 7 days. 7 tablet 0   metoprolol succinate (TOPROL XL) 25 MG 24 hr tablet Take 1 tablet (25 mg total) by mouth daily. 90 tablet 3   montelukast (SINGULAIR) 10 MG tablet TAKE 1 TABLET EACH DAY. 90 tablet 1   nitroGLYCERIN (NITROSTAT) 0.4 MG SL tablet Place 1 tablet (0.4 mg total) under the tongue every 5 (five) minutes as needed for chest pain (CP or SOB). 25 tablet 3   Ophthalmic Irrigation Solution (OCUSOFT EYE Simpson OP) Place 1 application into both eyes at bedtime.      OVER THE COUNTER MEDICATION Apply 1 application topically daily as needed (pain). Hemp Cream     pantoprazole (PROTONIX) 40 MG tablet TAKE 1 TABLET BY MOUTH TWICE DAILY. 180 tablet 3   Polyethyl Glycol-Propyl Glycol (SYSTANE ULTRA) 0.4-0.3 % SOLN Place 1 drop into both eyes daily as needed (dry eyes).     polyethylene glycol (MIRALAX / GLYCOLAX) 17 g packet Take 17 g by mouth daily.     potassium chloride (KLOR-CON) 10 MEQ tablet TAKE 1 TABLET BY MOUTH DAILY. 90 tablet 3   rosuvastatin (CRESTOR) 20 MG tablet TAKE 1 TABLET ONCE DAILY. 90 tablet 3   sertraline (ZOLOFT) 25 MG tablet Take 1 tablet (25 mg total) by mouth daily. 30 tablet 3   tretinoin (RETIN-A) 0.1 % cream Apply 1 application topically every Monday, Wednesday, and Friday.      apixaban (ELIQUIS) 5 MG TABS tablet Take 1 tablet (5 mg total) by mouth 2 (two) times daily. 180 tablet 3   No current facility-administered medications for this encounter.    Allergies  Allergen Reactions   Lactose Intolerance (Gi) Other (See Comments)    GI upset   Amoxicillin-Pot Clavulanate Hives and Rash   Penicillins Hives and Rash    30 years   Sulfa Antibiotics Hives and Rash   Sulfonamide Derivatives Hives and Rash    Social History   Socioeconomic History   Marital status: Married    Spouse name: Not on file   Number of children: 3   Years of education: 16   Highest education level: Bachelor's degree (e.g., BA, AB, BS)  Occupational History   Occupation: Retired  Tobacco Use   Smoking status: Former    Packs/day: 0.50    Years: 15.00    Pack years: 7.50    Types: Cigarettes    Start date: 1971    Quit date: 1986    Years since quitting: 36.9   Smokeless tobacco: Never   Tobacco comments:    quit smoking 1980  Vaping Use   Vaping Use: Never used  Substance and Sexual Activity   Alcohol use: Yes    Alcohol/week: 7.0 standard drinks    Types: 7 Glasses of wine per week    Comment: wine nightly with dinner   Drug use: No    Sexual  activity: Not Currently  Other Topics Concern   Not on file  Social History Narrative   ** Merged History Encounter **       Lives at [facility] with her husband. Right-handed. Caffeine use: 3-4 cups per day (some tea, mixes coffee with decaf).   Social Determinants of Health   Financial Resource Strain: Not on file  Food Insecurity: Not on file  Transportation Needs: Not on file  Physical Activity: Not on file  Stress: Not on file  Social Connections: Not on file  Intimate Partner Violence: Not At Risk   Fear of Current or Ex-Partner: No   Emotionally Abused: No   Physically Abused: No   Sexually Abused: No     ROS- All systems are reviewed and negative except as per the HPI above.  Physical Exam: Vitals:   04/04/21 1014  BP: 104/74  Pulse: 84  Weight: 72.3 kg  Height: 5\' 5"  (1.651 m)    GEN- The patient is a well appearing elderly female, alert and oriented x 3 today.   HEENT-head normocephalic, atraumatic, sclera clear, conjunctiva pink, hearing intact, trachea midline. Lungs- Clear to ausculation bilaterally, normal work of breathing Heart- irregular rate and rhythm, no rubs or gallops, 2/6 diastolic murmur   GI- soft, NT, ND, + BS Extremities- no clubbing, cyanosis, or edema MS- no significant deformity or atrophy Skin- no rash or lesion Psych- euthymic mood, full affect Neuro- strength and sensation are intact   Wt Readings from Last 3 Encounters:  04/04/21 72.3 kg  03/30/21 70.8 kg  03/07/21 73.5 kg    EKG today demonstrates  Afib Vent. rate 84 BPM PR interval * ms QRS duration 88 ms QT/QTcB 408/482 ms  Echo 07/17/20 demonstrated  1. Left ventricular ejection fraction, by estimation, is 70 to 75%. The  left ventricle has hyperdynamic function. The left ventricle has no  regional wall motion abnormalities. Left ventricular diastolic parameters are indeterminate.   2. Right ventricular systolic function is mildly reduced. The right   ventricular size is normal. There is mildly elevated pulmonary artery  systolic pressure.   3. Right atrial size was moderately dilated.   4. The mitral valve is normal in structure. No evidence of mitral valve  regurgitation.   5. The aortic valve has been repaired/replaced. Aortic valve  regurgitation is mild to moderate. There is a 21 mm Edwards bioprosthetic  valve present in the aortic position. Procedure Date: 11/16/14.   Comparison(s): 01/20/20 EF 55-60%. AV 81mmHg mean PG, 65mmHg peak PG.   Epic records are reviewed at length today  CHA2DS2-VASc Score = 6  The patient's score is based upon: CHF History: 1 HTN History: 1 Diabetes History: 0 Stroke History: 0 Vascular Disease History: 1 Age Score: 2 Gender Score: 1      ASSESSMENT AND PLAN: 1. Permanent atrial fibrillation  The patient's CHA2DS2-VASc score is 6, indicating a 9.7% annual risk of stroke.   Patient in rate controlled afib today, minimal if any symptoms.  Continue Toprol 25 mg daily Continue Eliquis 5 mg BID (weight > 60 kg, Cr < 1.5)  2. Secondary Hypercoagulable State (ICD10:  D68.69) The patient is at significant risk for stroke/thromboembolism based upon her CHA2DS2-VASc Score of 6.  Continue Apixaban (Eliquis).   3. CAD No anginal symptoms.  4. Chronic diastolic CHF No signs or symptoms of fluid overload today.  5. Aortic stenosis Bicuspid aortic valve S/p AVR 2016   Follow up with Dr Burt Knack per recall. AF clinic  as needed.    [facility] [address] [place], [place] [address] [phone] 04/04/2021 11:59 AM

## 2021-04-04 NOTE — Addendum Note (Signed)
Encounter addended by: Hinda Kehr, CMA on: 04/04/2021 12:10 PM  Actions taken: Medication long-term status modified, Order list changed

## 2021-04-11 ENCOUNTER — Ambulatory Visit (HOSPITAL_COMMUNITY)
Admission: RE | Admit: 2021-04-11 | Discharge: 2021-04-11 | Disposition: A | Discharge status: Home / Self Care | Payer: Medicare Other | Source: Ambulatory Visit | Attending: Radiation Oncology | Admitting: Radiation Oncology

## 2021-04-11 DIAGNOSIS — C3411 Malignant neoplasm of upper lobe, right bronchus or lung: Secondary | ICD-10-CM | POA: Diagnosis not present

## 2021-04-11 DIAGNOSIS — I700 Atherosclerosis of aorta: Secondary | ICD-10-CM | POA: Diagnosis not present

## 2021-04-11 LAB — POCT I-STAT CREATININE: Creatinine, Ser: 1 mg/dL (ref 0.44–1.00)

## 2021-04-11 MED ORDER — SODIUM CHLORIDE (PF) 0.9 % IJ SOLN
INTRAMUSCULAR | Status: AC
  Filled 2021-04-11: qty 50

## 2021-04-11 MED ORDER — IOHEXOL 350 MG/ML SOLN
60.0000 mL | Freq: Once | INTRAVENOUS | Status: AC | PRN
  Administered 2021-04-11: 60 mL via INTRAVENOUS

## 2021-04-12 ENCOUNTER — Telehealth: Payer: Self-pay

## 2021-04-12 NOTE — Telephone Encounter (Signed)
Left message in reference to completing the "nursing portion" of patient's 1:00pm-04/15/21 TELEPHONE only appointment w/ Shona Simpson PA-C. I left my extension [phone] for patient to return call.

## 2021-04-15 ENCOUNTER — Ambulatory Visit
Admission: RE | Admit: 2021-04-15 | Discharge: 2021-04-15 | Disposition: A | Discharge status: Home / Self Care | Payer: Medicare Other | Source: Ambulatory Visit | Attending: Radiation Oncology | Admitting: Radiation Oncology

## 2021-04-15 ENCOUNTER — Encounter: Payer: Self-pay | Admitting: Radiation Oncology

## 2021-04-15 DIAGNOSIS — C3411 Malignant neoplasm of upper lobe, right bronchus or lung: Secondary | ICD-10-CM

## 2021-04-15 DIAGNOSIS — K6389 Other specified diseases of intestine: Secondary | ICD-10-CM

## 2021-04-15 NOTE — Progress Notes (Signed)
Radiation Oncology         ([phone]) [phone] ________________________________  Outpatient Follow Up - Conducted via telephone due to current COVID-19 concerns for limiting patient exposure  I spoke with the patient to conduct this consult visit via telephone to spare the patient unnecessary potential exposure in the healthcare setting during the current COVID-19 pandemic. The patient was notified in advance and was offered a [place] meeting to allow for face to face communication but unfortunately reported that they did not have the appropriate resources/technology to support such a visit and instead preferred to proceed with a telephone visit.  Name: [name]        MRN: [id number]  Date of Service: 04/15/2021 DOB: 1932-09-20  JZ:PHXTA, Claudina Lick, MD  [name], MD     REFERRING PHYSICIAN: [name], MD   DIAGNOSIS: The primary encounter diagnosis was Malignant neoplasm of upper lobe of right lung [facility]). A diagnosis of Pneumatosis intestinalis was also pertinent to this visit.   HISTORY OF PRESENT ILLNESS: [name] is a 87 y.o. female with a history of tage IA2, cT1bN0M0, NSCLC, adenocarcinoma of the RUL.  The patient has a history of right breast cancer treated in [place], [place] with lumpectomy and adjuvant radiotherapy about 15 years ago. She also has a history of valvular disease and underwent an aortic valve replacement in July 2016 by Dr. Nell Range, as well as atrial fibrillation and diastolic CHF. The patient has been followed by pulmonary medicine for a right upper lobe pulmonary nodule that was originally identified in May 2018, notes indicate that there had been a chest wall lipoma and this CT scan was performed due to this work-up. Over time, though it was unchanged in overall size there was new solid component in the medial aspect measuring up to 4 mm, no evidence of adenopathy was identified, and she was counseled on the rationale for bronchoscopy which was  performed on 03/06/2020. Biopsies and cytology of the right upper lobe from fine-needle aspiration and brushing were consistent with malignant cells morphologically consistent with an adenocarcinoma.  Atypical cells were also seen in the right upper lobe lavage.  She did undergo PET imaging on 03/30/2020, the right upper lobe nodule seen previously by CT showed an SUV max of 1.9, associated fiducial markers were seen, and no additional hypermetabolic changes were identified in the lung or within the lymph node regions.  No other hypermetabolic activity was noted throughout the body.    She proceeded with stereotactic body radiotherapy (SBRT) in 3 fractions which she completed to the RUL target in December 2021. She's been followed in surveillance since. Interestingly her surveilance scans have identified pneumatosis of the colon. She underwent work up with additional imaging and met with Dr. Rush Landmark regarding these findings. A Cologuard was positive but at the time of colonoscopy in September 2022 she had focal changes of the bowel wall consistent with her pneumatosis and a transverse polyp that was consistent with tubular adenoma. Dr. Rush Landmark does not recommend repeat endoscopy. She has been followed without new or active disease in the lungs. She had her most recent CT chest on 04/11/21 and this showed post radiaiton changes in the RUL. She has bilateral areas of nodularity throughout the lungs consistent with recent infection. She in fact had been seen earlier this month in the ED with these symptoms and was treated with Levaquin for Bronchitis. She had been seen as well in November in the ED and treated with Doxycycline.  She had negative chest X rays in both November and December. She's contacted today to discuss these results.    PREVIOUS RADIATION THERAPY:    04/18/20-04/26/20 SBRT Treatment: The RUL target was treated to 54 Gy in 3 fractions  About 2006: The patient received adjuvant right  breast radiotherapy. Details are not known at this point about her dosing.   PAST MEDICAL HISTORY:  Past Medical History:  Diagnosis Date   Allergy    Anemia    Anxiety    Aortic valve stenosis, severe    s/p AVR // mild to mod paravalvular leakage // Echo 9/21: EF 55-60, no RWMA, mild LVH, GR 2 DD, mildly reduced RVSF, RVSP 38.7, moderate LAE, mild RAE, AVR with mean gradient 11 mmHg, mild paravalvular leakage   Arthritis    Breast cancer ([place]) 2007   right   Cataract    Constipation    Dysrhythmia    A-fib   Hiatal hernia    pt denies   Hypercholesteremia    Hypertension    Hypoglycemia    Osteopenia    Personal history of radiation therapy    Tingling    Ulcer 2012   bleeding gastric Ulcer       PAST SURGICAL HISTORY: Past Surgical History:  Procedure Laterality Date   AORTIC VALVE REPLACEMENT N/A 11/16/2014   Procedure: AORTIC VALVE REPLACEMENT (AVR);  Surgeon: Gaye Pollack, MD;  Location: [place];  Service: Open Heart Surgery;  Laterality: N/A;   BIOPSY  12/15/2017   Procedure: BIOPSY;  Surgeon: Rush Landmark Telford Nab., MD;  Location: [place];  Service: Gastroenterology;;   BIOPSY  01/10/2021   Procedure: BIOPSY;  Surgeon: Irving Copas., MD;  Location: [place];  Service: Gastroenterology;;   BREAST BIOPSY     BREAST LUMPECTOMY Right    2005?   BREAST SURGERY     BRONCHIAL BIOPSY  03/06/2020   Procedure: BRONCHIAL BIOPSIES;  Surgeon: Collene Gobble, MD;  Location: [facility] ENDOSCOPY;  Service: Pulmonary;;   BRONCHIAL BRUSHINGS  03/06/2020   Procedure: BRONCHIAL BRUSHINGS;  Surgeon: Collene Gobble, MD;  Location: [facility] ENDOSCOPY;  Service: Pulmonary;;   BRONCHIAL NEEDLE ASPIRATION BIOPSY  03/06/2020   Procedure: BRONCHIAL NEEDLE ASPIRATION BIOPSIES;  Surgeon: Collene Gobble, MD;  Location: [facility] ENDOSCOPY;  Service: Pulmonary;;   BRONCHIAL WASHINGS  03/06/2020   Procedure: BRONCHIAL WASHINGS;  Surgeon: Collene Gobble, MD;  Location: [facility] ENDOSCOPY;  Service:  Pulmonary;;   CARDIAC CATHETERIZATION N/A 10/18/2014   Procedure: Right/Left Heart Cath and Coronary Angiography;  Surgeon: [name], MD;  Location: [place] CV LAB;  Service: Cardiovascular;  Laterality: N/A;   CARDIOVERSION N/A 12/09/2019   Procedure: CARDIOVERSION;  Surgeon: Josue Hector, MD;  Location: [place];  Service: Cardiovascular;  Laterality: N/A;   CARDIOVERSION N/A 12/30/2019   Procedure: CARDIOVERSION;  Surgeon: Josue Hector, MD;  Location: [facility] ENDOSCOPY;  Service: Cardiovascular;  Laterality: N/A;   COLONOSCOPY WITH PROPOFOL N/A 01/10/2021   Procedure: COLONOSCOPY WITH PROPOFOL;  Surgeon: Irving Copas., MD;  Location: [place];  Service: Gastroenterology;  Laterality: N/A;   CORONARY ANGIOGRAPHY N/A 02/04/2019   Procedure: CORONARY ANGIOGRAPHY;  Surgeon: Nelva Bush, MD;  Location: [place] CV LAB;  Service: Cardiovascular;  Laterality: N/A;   COSMETIC SURGERY     face   ESOPHAGOGASTRODUODENOSCOPY (EGD) WITH PROPOFOL N/A 12/15/2017   Procedure: ESOPHAGOGASTRODUODENOSCOPY (EGD) WITH PROPOFOL;  Surgeon: Rush Landmark Telford Nab., MD;  Location: [place];  Service: Gastroenterology;  Laterality: N/A;  FIDUCIAL MARKER PLACEMENT  03/06/2020   Procedure: FIDUCIAL MARKER PLACEMENT;  Surgeon: Collene Gobble, MD;  Location: [facility] ENDOSCOPY;  Service: Pulmonary;;   PARATHYROIDECTOMY     TEE WITHOUT CARDIOVERSION N/A 11/16/2014   Procedure: TRANSESOPHAGEAL ECHOCARDIOGRAM (TEE);  Surgeon: Gaye Pollack, MD;  Location: [place];  Service: Open Heart Surgery;  Laterality: N/A;   TEE WITHOUT CARDIOVERSION N/A 12/09/2019   Procedure: TRANSESOPHAGEAL ECHOCARDIOGRAM (TEE);  Surgeon: Josue Hector, MD;  Location: [facility] ENDOSCOPY;  Service: Cardiovascular;  Laterality: N/A;   TONSILLECTOMY     VIDEO BRONCHOSCOPY WITH ENDOBRONCHIAL NAVIGATION Right 03/06/2020   Procedure: VIDEO BRONCHOSCOPY WITH ENDOBRONCHIAL NAVIGATION;  Surgeon: Collene Gobble, MD;  Location: [facility]  ENDOSCOPY;  Service: Pulmonary;  Laterality: Right;     FAMILY HISTORY:  Family History  Problem Relation Age of Onset   Hypertension Mother    Rectal cancer Mother    Colon cancer Mother    Kidney disease Father    Cancer Cousin 60       breast and ovarian    Esophageal cancer Neg Hx    Stomach cancer Neg Hx    Colon polyps Neg Hx      SOCIAL HISTORY:  reports that she quit smoking about 36 years ago. Her smoking use included cigarettes. She started smoking about 52 years ago. She has a 7.50 pack-year smoking history. She has never used smokeless tobacco. She reports current alcohol use of about 7.0 standard drinks per week. She reports that she does not use drugs.  The patient is married and lives in [place] in independent living at [place].     ALLERGIES: Lactose intolerance (gi), Amoxicillin-pot clavulanate, Penicillins, Sulfa antibiotics, and Sulfonamide derivatives   MEDICATIONS:  Current Outpatient Medications  Medication Sig Dispense Refill   acetaminophen (TYLENOL) 500 MG tablet Take 500-1,000 mg by mouth every 6 (six) hours as needed for mild pain or moderate pain.      ALPRAZolam (XANAX) 0.5 MG tablet TAKE 1 TABLET ONCE DAILY AS NEEDED FOR AN ANXIETY, DO NOT TAKE AT NIGHT. 90 tablet 0   apixaban (ELIQUIS) 5 MG TABS tablet Take 1 tablet (5 mg total) by mouth 2 (two) times daily. 180 tablet 3   benzonatate (TESSALON) 100 MG capsule Take 1 capsule (100 mg total) by mouth every 8 (eight) hours. 21 capsule 0   Calcium Carb-Cholecalciferol 600-400 MG-UNIT TABS Take 1 tablet by mouth daily.     Chloral Hydrate CRYS Liquid 100 mg/ ml.    Take 10 mls PO HS prn 900 g 0   Cholecalciferol (VITAMIN D-3) 1000 UNITS CAPS Take 1,000 Units by mouth daily.      ferrous sulfate 325 (65 FE) MG tablet Take 325 mg by mouth 2 (two) times a week. Every Monday and Wednesday     furosemide (LASIX) 20 MG tablet Take 0.5 tablets (10 mg total) by mouth daily. You may take 1 extra tablet daily  for weight gain of 3 pounds. 45 tablet 3   HYDROcodone bit-homatropine (HYCODAN) 5-1.5 MG/5ML syrup Take 5 mLs by mouth every 8 (eight) hours as needed for cough. 120 mL 0   ipratropium (ATROVENT) 0.06 % nasal spray Place 2 sprays into both nostrils in the morning and at bedtime.     levocetirizine (XYZAL) 5 MG tablet Take 5 mg by mouth at bedtime as needed for allergies.      metoprolol succinate (TOPROL XL) 25 MG 24 hr tablet Take 1 tablet (25 mg total) by mouth daily.  90 tablet 3   montelukast (SINGULAIR) 10 MG tablet TAKE 1 TABLET EACH DAY. 90 tablet 1   nitroGLYCERIN (NITROSTAT) 0.4 MG SL tablet Place 1 tablet (0.4 mg total) under the tongue every 5 (five) minutes as needed for chest pain (CP or SOB). 25 tablet 3   Ophthalmic Irrigation Solution (OCUSOFT EYE [place] OP) Place 1 application into both eyes at bedtime.     OVER THE COUNTER MEDICATION Apply 1 application topically daily as needed (pain). Hemp Cream     pantoprazole (PROTONIX) 40 MG tablet TAKE 1 TABLET BY MOUTH TWICE DAILY. 180 tablet 3   Polyethyl Glycol-Propyl Glycol (SYSTANE ULTRA) 0.4-0.3 % SOLN Place 1 drop into both eyes daily as needed (dry eyes).     polyethylene glycol (MIRALAX / GLYCOLAX) 17 g packet Take 17 g by mouth daily.     rosuvastatin (CRESTOR) 20 MG tablet TAKE 1 TABLET ONCE DAILY. 90 tablet 3   sertraline (ZOLOFT) 25 MG tablet Take 1 tablet (25 mg total) by mouth daily. 30 tablet 3   tretinoin (RETIN-A) 0.1 % cream Apply 1 application topically every Monday, Wednesday, and Friday.      No current facility-administered medications for this encounter.     REVIEW OF SYSTEMS: On review of systems, the patient reports that she is feeling much better since completing her second course of antibiotics. Her cough has lessened and less productive. No fevers are currently noted. No complaints of abdominal pain are noted. No other concerns are verbalized.   PHYSICAL EXAM:   There were no vitals filed for this  visit.  Unable to assess due to encounter type.     ECOG = 1  0 - Asymptomatic (Fully active, able to carry on all predisease activities without restriction)  1 - Symptomatic but completely ambulatory (Restricted in physically strenuous activity but ambulatory and able to carry out work of a light or sedentary nature. For example, light housework, office work)  2 - Symptomatic, <50% in bed during the day (Ambulatory and capable of all self care but unable to carry out any work activities. Up and about more than 50% of waking hours)  3 - Symptomatic, >50% in bed, but not bedbound (Capable of only limited self-care, confined to bed or chair 50% or more of waking hours)  4 - Bedbound (Completely disabled. Cannot carry on any self-care. Totally confined to bed or chair)  5 - Death   Eustace Pen MM, Creech RH, Tormey DC, et al. [phone]). "Toxicity and response criteria of the [facility]". [place] Oncol. 5 (6): 649-55    LABORATORY DATA:  Lab Results  Component Value Date   WBC 8.6 03/05/2021   HGB 12.4 03/05/2021   HCT 37.6 03/05/2021   MCV 97.2 03/05/2021   PLT 140.0 (L) 03/05/2021   Lab Results  Component Value Date   NA 136 07/13/2020   K 4.5 07/13/2020   CL 101 07/13/2020   CO2 28 07/13/2020   Lab Results  Component Value Date   ALT 24 01/17/2021   AST 31 01/17/2021   ALKPHOS 107 01/17/2021   BILITOT 0.6 01/17/2021      RADIOGRAPHY: CT CHEST W CONTRAST  Result Date: 04/12/2021 CLINICAL DATA:  87 year old female with history of non-small cell lung cancer, status post SB RT. EXAM: CT CHEST WITH CONTRAST TECHNIQUE: Multidetector CT imaging of the chest was performed during intravenous contrast administration. CONTRAST:  43m OMNIPAQUE IOHEXOL 350 MG/ML SOLN COMPARISON:  Chest CT 10/05/2020. FINDINGS:  Cardiovascular: Heart size is mildly enlarged with biatrial dilatation. There is no significant pericardial fluid, thickening or pericardial  calcification. Aortic atherosclerosis. No definite coronary artery calcifications. Status post median sternotomy for aortic valve replacement with a stented bioprosthesis. Mediastinum/Nodes: No pathologically enlarged mediastinal or hilar lymph nodes. Esophagus is unremarkable in appearance. No axillary lymphadenopathy. Lungs/Pleura: Previously noted right upper lobe pulmonary nodule is now completely obscured by surrounding mass-like architectural distortion, most compatible with evolving postradiation fibrosis. Fiducial markers in this region are incidentally noted. New nodularity in the anterior aspect of the left lower lobe adjacent to the left major fissure measuring 2.7 x 1.1 x 1.7 cm (axial image 111 of series 7 and sagittal image 119 of series 6). Some other scattered areas of septal thickening, thickening of the peribronchovascular interstitium, and peribronchovascular micro and macronodularity are noted elsewhere in the lungs, most evident in the right middle and lower lobes, where the largest of these nodules measure 1.2 x 0.6 cm (axial image 87 of series 7 in the right lower lobe) and 1.0 x 0.5 cm (axial image 80 of series 7 in the right middle lobe). No pleural effusions. Upper Abdomen: Severe pneumatosis of the splenic flexure of the colon again noted. Musculoskeletal: Median sternotomy wires. There are no aggressive appearing lytic or blastic lesions noted in the visualized portions of the skeleton. IMPRESSION: 1. Continued evolution of postradiation changes in the right upper lobe with what appears to be a developing area of postradiation mass-like fibrosis. New areas of nodularity scattered throughout the lungs bilaterally, favored to be of infectious or inflammatory etiology, but close attention on follow-up studies is strongly recommended. 2. Severe pneumatosis of the splenic flexure of the colon is again noted, and of uncertain clinical significance. 3. Cardiomegaly with biatrial dilatation. 4.  Aortic atherosclerosis. Aortic Atherosclerosis (ICD10-I70.0). Electronically Signed   By: Vinnie Langton M.D.   On: 04/12/2021 07:29   DG Chest Port 1 View  Result Date: 03/30/2021 CLINICAL DATA:  Cough, rule out pneumonia EXAM: PORTABLE CHEST 1 VIEW COMPARISON:  03/02/2021 FINDINGS: Cardiomegaly status post median sternotomy and CABG. Bandlike scarring and consolidation of the right upper lobe with fiducial markers. No acute airspace opacity. Osseous structures are unremarkable. IMPRESSION: 1. No acute abnormality of the lungs. 2. Cardiomegaly. 3. Bandlike scarring and consolidation of the right upper lobe with fiducial markers, consistent with sequelae of radiation therapy. Electronically Signed   By: Delanna Ahmadi M.D.   On: 03/30/2021 12:53        IMPRESSION/PLAN: 1. Stage IA2, cT1bN0M0, NSCLC, adenocarcinoma of the RUL. The patient appears to be stable and we will continue surveillance per NCCN Guidelines. Given the recent infections she's been treated for, I would favor a repeat CT in 3 months, followed by returning to CT chest 6 months thereafter if these findings have resolved. She is in agreement.  2. Bilateral lung nodularity favor infectious process. This will be followed expectantly but we will repeat her CT as above to ensure these findings have resolved. If her symptoms worsen, and symptoms of pneumonia return, she should contact her PCP and Pulmonologist Dr. Lamonte Sakai.  3. Pneumatosis of the splenic flexure. This will be followed expectantly but she understands the need to proceed with evaluation if she developed acute symptoms of pain, fever, or abrupt GI symptoms.     Given current concerns for patient exposure during the COVID-19 pandemic, this encounter was conducted via telephone.  The patient has provided two factor identification and has given verbal consent for  this type of encounter and has been advised to only accept a meeting of this type in a secure network environment. The  time spent during this encounter was 45 minutes including preparation, discussion, and coordination of the patient's care. The attendants for this meeting include  Hayden Pedro  and Bruce and Reubin Milan During the encounter,  Hayden Pedro was located at [facility] Radiation Oncology Department.  [name] was located at home. Her daughter [name] joined [place] while driving.     The above documentation reflects my direct findings during this shared patient visit. Please see the separate note by Dr. Lisbeth Renshaw on this date for the remainder of the patient's plan of care.    Carola Rhine, PAC

## 2021-04-15 NOTE — Progress Notes (Signed)
Patient states doing well. No symptoms reported at this time. Meaningful use complete.  Patient notified of 1:00pm 04/15/21 telephone only appointment w/ Shona Simpson PA-C and verbalized understanding.   Patient preferred contact. [phone].

## 2021-04-16 ENCOUNTER — Other Ambulatory Visit: Payer: Self-pay | Admitting: Internal Medicine

## 2021-04-22 ENCOUNTER — Encounter: Payer: Self-pay | Admitting: Cardiovascular Disease

## 2021-04-29 ENCOUNTER — Other Ambulatory Visit: Payer: Self-pay | Admitting: Internal Medicine

## 2021-04-30 ENCOUNTER — Other Ambulatory Visit: Payer: Self-pay | Admitting: Internal Medicine

## 2021-04-30 DIAGNOSIS — Z1231 Encounter for screening mammogram for malignant neoplasm of breast: Secondary | ICD-10-CM

## 2021-05-17 ENCOUNTER — Encounter: Payer: Self-pay | Admitting: Cardiovascular Disease

## 2021-05-17 ENCOUNTER — Ambulatory Visit (INDEPENDENT_AMBULATORY_CARE_PROVIDER_SITE_OTHER): Payer: PPO | Admitting: Cardiovascular Disease

## 2021-05-17 ENCOUNTER — Other Ambulatory Visit: Payer: Self-pay

## 2021-05-17 VITALS — BP 112/70 | HR 72 | Ht 65.0 in | Wt 161.6 lb

## 2021-05-17 DIAGNOSIS — Z952 Presence of prosthetic heart valve: Secondary | ICD-10-CM | POA: Diagnosis not present

## 2021-05-17 DIAGNOSIS — I5032 Chronic diastolic (congestive) heart failure: Secondary | ICD-10-CM

## 2021-05-17 DIAGNOSIS — D6869 Other thrombophilia: Secondary | ICD-10-CM | POA: Diagnosis not present

## 2021-05-17 DIAGNOSIS — I4821 Permanent atrial fibrillation: Secondary | ICD-10-CM

## 2021-05-17 NOTE — Progress Notes (Signed)
Ortho Cardiology Office Note:    Date:  05/19/2021   ID:  [name], [name] 12/23/1932, MRN [id number]  PCP:  [name], MD   [facility] Providers Cardiologist:  [name], MD     Referring MD: [name], MD   Chief Complaint  Patient presents with   Atrial Fibrillation    History of Present Illness:    Loryn Haacke is a 88 y.o. female with a hx of: Persistent atrial fibrillation  S/p TEE-DCCV 11/2019 CHA2DS2-VASc=6 (age x 2, female, CAD, CHF, HTN) >> Apixaban   Bicuspid valve, aortic stenosis S/p bioprosthetic AVR in 2016 Paravalvular leak Normal coronary arteries prior to AVR Coronary artery disease S/p NSTEMI in 01/2019 >> total occlusion of distal LAD felt to be prob embolic event Ischemic CM, EF 45-50 w ant-apical AK TEE 8/21: EF 55 Breast CA Hypertension  Hyperlipidemia  Diastolic CHF   The patient is here alone today.  Her daughter, [name], is conferenced in over the telephone.  The patient reports stable exertional dyspnea.  She has to stop and rest when she walks up a hill.  She otherwise does well on level ground.  She denies any recent problems with apnea, PND, or leg swelling.  Her weights have been stable and she weighs herself on a daily basis.  She reports no bleeding problems on chronic apixaban.  She denies symptoms of chest pain or pressure.  Past Medical History:  Diagnosis Date   Allergy    Anemia    Anxiety    Aortic valve stenosis, severe    s/p AVR // mild to mod paravalvular leakage // Echo 9/21: EF 55-60, no RWMA, mild LVH, GR 2 DD, mildly reduced RVSF, RVSP 38.7, moderate LAE, mild RAE, AVR with mean gradient 11 mmHg, mild paravalvular leakage   Arthritis    Breast cancer ([place]) 2007   right   Cataract    Constipation    Dysrhythmia    A-fib   Hiatal hernia    pt denies   Hypercholesteremia    Hypertension    Hypoglycemia    Osteopenia    Personal history of radiation therapy    Tingling    Ulcer  2012   bleeding gastric Ulcer    Past Surgical History:  Procedure Laterality Date   AORTIC VALVE REPLACEMENT N/A 11/16/2014   Procedure: AORTIC VALVE REPLACEMENT (AVR);  Surgeon: Gaye Pollack, MD;  Location: [place];  Service: Open Heart Surgery;  Laterality: N/A;   BIOPSY  12/15/2017   Procedure: BIOPSY;  Surgeon: Rush Landmark Telford Nab., MD;  Location: [place];  Service: Gastroenterology;;   BIOPSY  01/10/2021   Procedure: BIOPSY;  Surgeon: Irving Copas., MD;  Location: [place];  Service: Gastroenterology;;   BREAST BIOPSY     BREAST LUMPECTOMY Right    2005?   BREAST SURGERY     BRONCHIAL BIOPSY  03/06/2020   Procedure: BRONCHIAL BIOPSIES;  Surgeon: Collene Gobble, MD;  Location: [facility] ENDOSCOPY;  Service: Pulmonary;;   BRONCHIAL BRUSHINGS  03/06/2020   Procedure: BRONCHIAL BRUSHINGS;  Surgeon: Collene Gobble, MD;  Location: [facility] ENDOSCOPY;  Service: Pulmonary;;   BRONCHIAL NEEDLE ASPIRATION BIOPSY  03/06/2020   Procedure: BRONCHIAL NEEDLE ASPIRATION BIOPSIES;  Surgeon: Collene Gobble, MD;  Location: [place];  Service: Pulmonary;;   BRONCHIAL WASHINGS  03/06/2020   Procedure: BRONCHIAL WASHINGS;  Surgeon: Collene Gobble, MD;  Location: [facility] ENDOSCOPY;  Service: Pulmonary;;   CARDIAC CATHETERIZATION N/A 10/18/2014  Procedure: Right/Left Heart Cath and Coronary Angiography;  Surgeon: [name], MD;  Location: [place] CV LAB;  Service: Cardiovascular;  Laterality: N/A;   CARDIOVERSION N/A 12/09/2019   Procedure: CARDIOVERSION;  Surgeon: Josue Hector, MD;  Location: [place];  Service: Cardiovascular;  Laterality: N/A;   CARDIOVERSION N/A 12/30/2019   Procedure: CARDIOVERSION;  Surgeon: Josue Hector, MD;  Location: [facility] ENDOSCOPY;  Service: Cardiovascular;  Laterality: N/A;   COLONOSCOPY WITH PROPOFOL N/A 01/10/2021   Procedure: COLONOSCOPY WITH PROPOFOL;  Surgeon: Irving Copas., MD;  Location: [place];  Service: Gastroenterology;  Laterality:  N/A;   CORONARY ANGIOGRAPHY N/A 02/04/2019   Procedure: CORONARY ANGIOGRAPHY;  Surgeon: Nelva Bush, MD;  Location: [place] CV LAB;  Service: Cardiovascular;  Laterality: N/A;   COSMETIC SURGERY     face   ESOPHAGOGASTRODUODENOSCOPY (EGD) WITH PROPOFOL N/A 12/15/2017   Procedure: ESOPHAGOGASTRODUODENOSCOPY (EGD) WITH PROPOFOL;  Surgeon: Rush Landmark Telford Nab., MD;  Location: [place];  Service: Gastroenterology;  Laterality: N/A;   FIDUCIAL MARKER PLACEMENT  03/06/2020   Procedure: FIDUCIAL MARKER PLACEMENT;  Surgeon: Collene Gobble, MD;  Location: [facility] ENDOSCOPY;  Service: Pulmonary;;   PARATHYROIDECTOMY     TEE WITHOUT CARDIOVERSION N/A 11/16/2014   Procedure: TRANSESOPHAGEAL ECHOCARDIOGRAM (TEE);  Surgeon: Gaye Pollack, MD;  Location: [place];  Service: Open Heart Surgery;  Laterality: N/A;   TEE WITHOUT CARDIOVERSION N/A 12/09/2019   Procedure: TRANSESOPHAGEAL ECHOCARDIOGRAM (TEE);  Surgeon: Josue Hector, MD;  Location: [facility] ENDOSCOPY;  Service: Cardiovascular;  Laterality: N/A;   TONSILLECTOMY     VIDEO BRONCHOSCOPY WITH ENDOBRONCHIAL NAVIGATION Right 03/06/2020   Procedure: VIDEO BRONCHOSCOPY WITH ENDOBRONCHIAL NAVIGATION;  Surgeon: Collene Gobble, MD;  Location: [facility] ENDOSCOPY;  Service: Pulmonary;  Laterality: Right;    Current Medications: Current Meds  Medication Sig   acetaminophen (TYLENOL) 500 MG tablet Take 500-1,000 mg by mouth every 6 (six) hours as needed for mild pain or moderate pain.    ALPRAZolam (XANAX) 0.5 MG tablet TAKE 1 TABLET TWICE DAILY AS NEEDED FOR AN ANXIETY, DO NOT TAKE AT NIGHT.   apixaban (ELIQUIS) 5 MG TABS tablet Take 1 tablet (5 mg total) by mouth 2 (two) times daily.   Calcium Carb-Cholecalciferol 600-400 MG-UNIT TABS Take 1 tablet by mouth daily.   Chloral Hydrate CRYS Liquid 100 mg/ ml.    Take 10 mls PO HS prn   Cholecalciferol (VITAMIN D-3) 1000 UNITS CAPS Take 1,000 Units by mouth daily.    ferrous sulfate 325 (65 FE) MG tablet Take 325 mg  by mouth 2 (two) times a week. Every Monday and Wednesday   furosemide (LASIX) 20 MG tablet Take 0.5 tablets (10 mg total) by mouth daily. You may take 1 extra tablet daily for weight gain of 3 pounds.   HYDROcodone bit-homatropine (HYCODAN) 5-1.5 MG/5ML syrup Take 5 mLs by mouth every 8 (eight) hours as needed for cough.   ipratropium (ATROVENT) 0.06 % nasal spray Place 2 sprays into both nostrils in the morning and at bedtime.   levocetirizine (XYZAL) 5 MG tablet Take 5 mg by mouth at bedtime as needed for allergies.    metoprolol succinate (TOPROL XL) 25 MG 24 hr tablet Take 1 tablet (25 mg total) by mouth daily.   montelukast (SINGULAIR) 10 MG tablet TAKE 1 TABLET EACH DAY.   nitroGLYCERIN (NITROSTAT) 0.4 MG SL tablet Place 1 tablet (0.4 mg total) under the tongue every 5 (five) minutes as needed for chest pain (CP or SOB).  Ophthalmic Irrigation Solution (OCUSOFT EYE [place] OP) Place 1 application into both eyes at bedtime.   OVER THE COUNTER MEDICATION Apply 1 application topically daily as needed (pain). Hemp Cream   pantoprazole (PROTONIX) 40 MG tablet TAKE 1 TABLET BY MOUTH TWICE DAILY.   Polyethyl Glycol-Propyl Glycol (SYSTANE ULTRA) 0.4-0.3 % SOLN Place 1 drop into both eyes daily as needed (dry eyes).   polyethylene glycol (MIRALAX / GLYCOLAX) 17 g packet Take 17 g by mouth daily.   potassium chloride (KLOR-CON) 10 MEQ tablet Take 10 mEq by mouth daily.   rosuvastatin (CRESTOR) 20 MG tablet TAKE 1 TABLET ONCE DAILY.   tretinoin (RETIN-A) 0.1 % cream Apply 1 application topically every Monday, Wednesday, and Friday.      Allergies:   Lactose intolerance (gi), Amoxicillin-pot clavulanate, Penicillins, Sulfa antibiotics, and Sulfonamide derivatives   Social History   Socioeconomic History   Marital status: Married    Spouse name: Not on file   Number of children: 3   Years of education: 16   Highest education level: Bachelor's degree (e.g., BA, AB, BS)  Occupational History    Occupation: Retired  Tobacco Use   Smoking status: Former    Packs/day: 0.50    Years: 15.00    Pack years: 7.50    Types: Cigarettes    Start date: 1971    Quit date: 1986    Years since quitting: 37.0   Smokeless tobacco: Never   Tobacco comments:    quit smoking 1980  Vaping Use   Vaping Use: Never used  Substance and Sexual Activity   Alcohol use: Yes    Alcohol/week: 7.0 standard drinks    Types: 7 Glasses of wine per week    Comment: wine nightly with dinner   Drug use: No   Sexual activity: Not Currently  Other Topics Concern   Not on file  Social History Narrative   ** Merged History Encounter **       Lives at [facility] with her husband. Right-handed. Caffeine use: 3-4 cups per day (some tea, mixes coffee with decaf).   Social Determinants of Health   Financial Resource Strain: Not on file  Food Insecurity: Not on file  Transportation Needs: Not on file  Physical Activity: Not on file  Stress: Not on file  Social Connections: Not on file     Family History: The patient's family history includes Cancer (age of onset: 3) in her cousin; Colon cancer in her mother; Hypertension in her mother; Kidney disease in her father; Rectal cancer in her mother. There is no history of Esophageal cancer, Stomach cancer, or Colon polyps.  ROS:   Please see the history of present illness.    All other systems reviewed and are negative.  EKGs/Labs/Other Studies Reviewed:    The following studies were reviewed today: 2d Echo 07/17/2020:  1. Left ventricular ejection fraction, by estimation, is 70 to 75%. The  left ventricle has hyperdynamic function. The left ventricle has no  regional wall motion abnormalities. Left ventricular diastolic parameters  are indeterminate.   2. Right ventricular systolic function is mildly reduced. The right  ventricular size is normal. There is mildly elevated pulmonary artery  systolic pressure.   3. Right atrial size was moderately  dilated.   4. The mitral valve is normal in structure. No evidence of mitral valve  regurgitation.   5. The aortic valve has been repaired/replaced. Aortic valve  regurgitation is mild to moderate. There is a 21 mm  Edwards bioprosthetic  valve present in the aortic position. Procedure Date: 11/16/14.   Comparison(s): 01/20/20 EF 55-60%. AV 32mmHg mean PG, 78mmHg peak PG.   EKG:  EKG is ordered today.  The ekg ordered today demonstrates atrial fibrillation 72 bpm, otherwise normal  Recent Labs: 07/13/2020: BUN 16; Potassium 4.5; Pro B Natriuretic peptide (BNP) 196.0; Sodium 136 01/17/2021: ALT 24; TSH 2.370 03/05/2021: Hemoglobin 12.4; Platelets 140.0 04/11/2021: Creatinine, Ser 1.00  Recent Lipid Panel    Component Value Date/Time   CHOL 177 07/13/2020 1158   TRIG 82.0 07/13/2020 1158   HDL 83.50 07/13/2020 1158   CHOLHDL 2 07/13/2020 1158   VLDL 16.4 07/13/2020 1158   LDLCALC 77 07/13/2020 1158   LDLDIRECT 144.0 11/29/2012 1601     Risk Assessment/Calculations:    CHA2DS2-VASc Score = 6   This indicates a 9.7% annual risk of stroke. The patient's score is based upon: CHF History: 1 HTN History: 1 Diabetes History: 0 Stroke History: 0 Vascular Disease History: 1 Age Score: 2 Gender Score: 1          Physical Exam:    VS:  BP 112/70    Pulse 72    Ht 5\' 5"  (1.651 m)    Wt 161 lb 9.6 oz (73.3 kg)    SpO2 96%    BMI 26.89 kg/m     Wt Readings from Last 3 Encounters:  05/17/21 161 lb 9.6 oz (73.3 kg)  04/04/21 159 lb 6.4 oz (72.3 kg)  03/30/21 156 lb (70.8 kg)     GEN:  Well nourished, well developed in no acute distress HEENT: Normal NECK: No JVD; No carotid bruits LYMPHATICS: No lymphadenopathy CARDIAC: irregularly irregular, 2/6 diastolic decrescendo murmur at the right upper sternal border unchanged from prior exams RESPIRATORY:  Clear to auscultation without rales, wheezing or rhonchi  ABDOMEN: Soft, non-tender, non-distended MUSCULOSKELETAL:  No edema; No  deformity  SKIN: Warm and dry NEUROLOGIC:  Alert and oriented x 3 PSYCHIATRIC:  Normal affect   ASSESSMENT:    1. Permanent atrial fibrillation ([place])   2. Secondary hypercoagulable state ([place])   3. S/P AVR (aortic valve replacement)   4. Chronic diastolic heart failure (HCC)    PLAN:    In order of problems listed above:  Stable with well-controlled heart rate, New York Heart Association functional class II symptoms.  Continue metoprolol succinate 25 mg daily. Continue apixaban for prevention of embolic complications of atrial fibrillation.  Patient appears to be tolerating this well.  Based on her age, weight, and creatinine clearance, she is on appropriate dosing of 5 mg twice daily. The patient has mild to moderate paravalvular regurgitation which has been stable.  I am going to update an echocardiogram.  Her physical exam is unchanged. Stable with New York Heart Association functional class II symptoms.  Continue current medical management.  Update 2D echocardiogram.           Medication Adjustments/Labs and Tests Ordered: Current medicines are reviewed at length with the patient today.  Concerns regarding medicines are outlined above.  Orders Placed This Encounter  Procedures   EKG 12-Lead   ECHOCARDIOGRAM COMPLETE   No orders of the defined types were placed in this encounter.   Patient Instructions  Medication Instructions:  Your physician recommends that you continue on your current medications as directed. Please refer to the Current Medication list given to you today.  *If you need a refill on your cardiac medications before your next appointment, please call your  pharmacy*   Lab Work: NONE If you have labs (blood work) drawn today and your tests are completely normal, you will receive your results only by: [place] (if you have MyChart) OR A paper copy in the mail If you have any lab test that is abnormal or we need to change your treatment, we will  call you to review the results.   Testing/Procedures: ECHO    Follow-Up: At [facility], you and your health needs are our priority.  As part of our continuing mission to provide you with exceptional heart care, we have created designated Provider Care Teams.  These Care Teams include your primary Cardiologist (physician) and Advanced Practice Providers (APPs -  Physician Assistants and Nurse Practitioners) who all work together to provide you with the care you need, when you need it.  Your next appointment:   6 month(s)  The format for your next appointment:   In Person  Provider:   [name], MD     Other Instructions Your physician has requested that you have an echocardiogram. Echocardiography is a painless test that uses sound waves to create images of your heart. It provides your doctor with information about the size and shape of your heart and how well your hearts chambers and valves are working. This procedure takes approximately one hour. There are no restrictions for this procedure.     Signed, [name], MD  05/19/2021 10:05 AM    [place]

## 2021-05-17 NOTE — Patient Instructions (Signed)
Medication Instructions:  Your physician recommends that you continue on your current medications as directed. Please refer to the Current Medication list given to you today.  *If you need a refill on your cardiac medications before your next appointment, please call your pharmacy*   Lab Work: NONE If you have labs (blood work) drawn today and your tests are completely normal, you will receive your results only by: [place] (if you have MyChart) OR A paper copy in the mail If you have any lab test that is abnormal or we need to change your treatment, we will call you to review the results.   Testing/Procedures: ECHO    Follow-Up: At [facility], you and your health needs are our priority.  As part of our continuing mission to provide you with exceptional heart care, we have created designated Provider Care Teams.  These Care Teams include your primary Cardiologist (physician) and Advanced Practice Providers (APPs -  Physician Assistants and Nurse Practitioners) who all work together to provide you with the care you need, when you need it.  Your next appointment:   6 month(s)  The format for your next appointment:   In Person  Provider:   [name], MD     Other Instructions Your physician has requested that you have an echocardiogram. Echocardiography is a painless test that uses sound waves to create images of your heart. It provides your doctor with information about the size and shape of your heart and how well your hearts chambers and valves are working. This procedure takes approximately one hour. There are no restrictions for this procedure.

## 2021-06-03 ENCOUNTER — Ambulatory Visit: Payer: Medicare Other

## 2021-06-04 ENCOUNTER — Other Ambulatory Visit (HOSPITAL_COMMUNITY): Payer: PPO

## 2021-06-05 ENCOUNTER — Ambulatory Visit
Admission: RE | Admit: 2021-06-05 | Discharge: 2021-06-05 | Disposition: A | Discharge status: Home / Self Care | Payer: PPO | Source: Ambulatory Visit | Attending: Internal Medicine | Admitting: Internal Medicine

## 2021-06-05 ENCOUNTER — Other Ambulatory Visit: Payer: Self-pay

## 2021-06-05 DIAGNOSIS — Z1231 Encounter for screening mammogram for malignant neoplasm of breast: Secondary | ICD-10-CM

## 2021-06-07 ENCOUNTER — Other Ambulatory Visit: Payer: Self-pay

## 2021-06-07 ENCOUNTER — Other Ambulatory Visit: Payer: Self-pay | Admitting: Internal Medicine

## 2021-06-07 ENCOUNTER — Ambulatory Visit (HOSPITAL_COMMUNITY): Payer: PPO | Attending: Cardiology

## 2021-06-07 DIAGNOSIS — I5032 Chronic diastolic (congestive) heart failure: Secondary | ICD-10-CM | POA: Diagnosis not present

## 2021-06-07 DIAGNOSIS — I4821 Permanent atrial fibrillation: Secondary | ICD-10-CM

## 2021-06-07 LAB — ECHOCARDIOGRAM COMPLETE
AR max vel: 0.97 cm2
AV Area VTI: 0.97 cm2
AV Area mean vel: 0.95 cm2
AV Mean grad: 10.4 mmHg
AV Peak grad: 18.7 mmHg
Ao pk vel: 2.16 m/s
Area-P 1/2: 4.67 cm2
S' Lateral: 2.8 cm

## 2021-06-10 ENCOUNTER — Encounter: Payer: Self-pay | Admitting: Internal Medicine

## 2021-06-10 ENCOUNTER — Encounter: Payer: Self-pay | Admitting: Cardiovascular Disease

## 2021-06-11 ENCOUNTER — Telehealth: Payer: Self-pay | Admitting: Cardiovascular Disease

## 2021-06-11 NOTE — Telephone Encounter (Signed)
Pt reaching out for echo results please advise

## 2021-06-11 NOTE — Telephone Encounter (Signed)
Thx Sarah - see result note. I think echo findings are stable with a few minor changes.

## 2021-06-17 ENCOUNTER — Telehealth: Payer: Self-pay | Admitting: *Deleted

## 2021-06-17 NOTE — Telephone Encounter (Signed)
RETURNED PATIENT'S PHONE CALL, SPOKE WITH PATIENT. ?

## 2021-06-28 ENCOUNTER — Encounter: Payer: Self-pay | Admitting: Internal Medicine

## 2021-07-09 ENCOUNTER — Telehealth: Payer: Self-pay | Admitting: Internal Medicine

## 2021-07-09 NOTE — Telephone Encounter (Signed)
Spoke with pt about scheduling Medicare Annual Wellness Visit and pt asked if I could call back at a later time she was busy. ? ?Last AWV  11/23/18 ? ?Please schedule at anytime with [facility] if patient calls the office back.   ? ?40 Minutes appointment  ? ?Any questions, please call me at [phone]  ?

## 2021-07-16 ENCOUNTER — Other Ambulatory Visit: Payer: Self-pay

## 2021-07-16 ENCOUNTER — Ambulatory Visit: Payer: Self-pay | Admitting: Radiation Oncology

## 2021-07-16 ENCOUNTER — Ambulatory Visit (HOSPITAL_BASED_OUTPATIENT_CLINIC_OR_DEPARTMENT_OTHER)
Admission: RE | Admit: 2021-07-16 | Discharge: 2021-07-16 | Disposition: A | Discharge status: Home / Self Care | Payer: PPO | Source: Ambulatory Visit | Attending: Radiation Oncology | Admitting: Radiation Oncology

## 2021-07-16 ENCOUNTER — Encounter (HOSPITAL_BASED_OUTPATIENT_CLINIC_OR_DEPARTMENT_OTHER): Payer: Self-pay

## 2021-07-16 DIAGNOSIS — C3411 Malignant neoplasm of upper lobe, right bronchus or lung: Secondary | ICD-10-CM | POA: Insufficient documentation

## 2021-07-16 LAB — POCT I-STAT CREATININE: Creatinine, Ser: 0.9 mg/dL (ref 0.44–1.00)

## 2021-07-16 MED ORDER — IOHEXOL 300 MG/ML  SOLN
100.0000 mL | Freq: Once | INTRAMUSCULAR | Status: AC | PRN
  Administered 2021-07-16: 75 mL via INTRAVENOUS

## 2021-07-17 ENCOUNTER — Other Ambulatory Visit: Payer: Self-pay | Admitting: Radiation Oncology

## 2021-07-17 DIAGNOSIS — K6389 Other specified diseases of intestine: Secondary | ICD-10-CM

## 2021-07-17 DIAGNOSIS — C3411 Malignant neoplasm of upper lobe, right bronchus or lung: Secondary | ICD-10-CM

## 2021-07-17 NOTE — Progress Notes (Signed)
I called and spoke with the patient about her recent CT scan from 07/16/2021.  This was a shorter interval follow-up given that her scan in December showed bilateral lung nodularity.  This was felt to represent inflammatory or postinfectious changes as the patient had also had recent upper respiratory infection and had been treated with antibiotics.  She states her breathing is stable and only has shortness of breath if she is going up on an incline when walking.  She is still very active and gets out daily for her bridge club.  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  No concerns for active disease is present.  The previously noted nodularity commented on her CT scan in December 2022 has resolved.  No new nodules, adenopathy or concerns for active disease are present in the lungs.  She does have stigmata of atherosclerotic disease cardiomegaly, and persistent pneumatosis of the bowel which has been formally worked up by Dr. Rush Landmark.  I recommended routine CT scan in 6 months time, or sooner if she has questions or concerns prior to that visit. ?

## 2021-08-08 ENCOUNTER — Encounter: Payer: Self-pay | Admitting: Internal Medicine

## 2021-08-08 NOTE — Patient Instructions (Addendum)
? ? ? ?  Blood work was ordered.   ? ? ? ?Medications changes include :   none ? ? ? ? ?Return in about 6 months (around 02/08/2022) for follow up, Schedule DEXA-[place] ( ordered last fall). ? ?

## 2021-08-08 NOTE — Progress Notes (Signed)
? ? ? ? ?Subjective:  ? ? Patient ID: [name], female    DOB: 08-03-1932, 88 y.o.   MRN: [id number] ? ?This visit occurred during the SARS-CoV-2 public health emergency.  Safety protocols were in place, including screening questions prior to the visit, additional usage of staff PPE, and extensive cleaning of exam room while observing appropriate contact time as indicated for disinfecting solutions.   ? ? ?HPI ?[name] is here for follow up of her chronic medical problems, including htn, afib, CAD, hld, prediabetes, anxiety, insomnia, h/o PUD, B12 def ? ?DEXA ordered, but not scheduled-we will schedule at checkout ? ?Wants to lose 10 lbs. she is watching what she is eating and trying to eat less. ? ?She is retaining fluid a little - she knows she can take 2 lasix 2 days in a row and that helps.  She does that periodically.  She denies any leg swelling. ? ?Plays bridge, walks ? ?Medications and allergies reviewed with patient and updated if appropriate. ? ?Current Outpatient Medications on File Prior to Visit  ?Medication Sig Dispense Refill  ? acetaminophen (TYLENOL) 500 MG tablet Take 500-1,000 mg by mouth every 6 (six) hours as needed for mild pain or moderate pain.     ? ALPRAZolam (XANAX) 0.5 MG tablet TAKE 1 TABLET TWICE DAILY AS NEEDED FOR AN ANXIETY, DO NOT TAKE AT NIGHT. 180 tablet 0  ? amiodarone (PACERONE) 200 MG tablet Take 200 mg by mouth daily.    ? apixaban (ELIQUIS) 5 MG TABS tablet Take 1 tablet (5 mg total) by mouth 2 (two) times daily. 180 tablet 3  ? betamethasone dipropionate 0.05 % cream Apply topically.    ? Calcium Carb-Cholecalciferol 600-400 MG-UNIT TABS Take 1 tablet by mouth daily.    ? Chloral Hydrate CRYS Liquid 100 mg/ ml.    Take 10 mls PO HS prn 900 g 0  ? Cholecalciferol (VITAMIN D-3) 1000 UNITS CAPS Take 1,000 Units by mouth daily.     ? clindamycin (CLEOCIN) 300 MG capsule SMARTSIG:2 Capsule(s) By Mouth    ? COQ10 MAXIMUM STRENGTH 400 MG CAPS SMARTSIG:1 By Mouth    ?  desonide (DESOWEN) 0.05 % cream     ? ferrous sulfate 325 (65 FE) MG tablet Take 325 mg by mouth 2 (two) times a week. Every Monday and Wednesday    ? furosemide (LASIX) 20 MG tablet Take 0.5 tablets (10 mg total) by mouth daily. You may take 1 extra tablet daily for weight gain of 3 pounds. 45 tablet 3  ? HYDROcodone bit-homatropine (HYCODAN) 5-1.5 MG/5ML syrup Take 5 mLs by mouth every 8 (eight) hours as needed for cough. 120 mL 0  ? ipratropium (ATROVENT) 0.06 % nasal spray Place 2 sprays into both nostrils in the morning and at bedtime.    ? levocetirizine (XYZAL) 5 MG tablet Take 5 mg by mouth at bedtime as needed for allergies.     ? Magnesium Oxide 500 MG CAPS     ? metoprolol succinate (TOPROL XL) 25 MG 24 hr tablet Take 1 tablet (25 mg total) by mouth daily. 90 tablet 3  ? montelukast (SINGULAIR) 10 MG tablet TAKE 1 TABLET EACH DAY. 90 tablet 1  ? nitroGLYCERIN (NITROSTAT) 0.4 MG SL tablet Place 1 tablet (0.4 mg total) under the tongue every 5 (five) minutes as needed for chest pain (CP or SOB). 25 tablet 3  ? Ophthalmic Irrigation Solution (OCUSOFT EYE [place] OP) Place 1 application into both eyes at bedtime.    ?  OVER THE COUNTER MEDICATION Apply 1 application topically daily as needed (pain). Hemp Cream    ? pantoprazole (PROTONIX) 40 MG tablet TAKE 1 TABLET BY MOUTH TWICE DAILY. 180 tablet 3  ? Polyethyl Glycol-Propyl Glycol (SYSTANE ULTRA) 0.4-0.3 % SOLN Place 1 drop into both eyes daily as needed (dry eyes).    ? polyethylene glycol (MIRALAX / GLYCOLAX) 17 g packet Take 17 g by mouth daily.    ? potassium chloride (KLOR-CON) 10 MEQ tablet Take 10 mEq by mouth daily.    ? rosuvastatin (CRESTOR) 20 MG tablet TAKE 1 TABLET ONCE DAILY. 90 tablet 3  ? tretinoin (RETIN-A) 0.05 % cream Apply topically.    ? tretinoin (RETIN-A) 0.1 % cream Apply 1 application topically every Monday, Wednesday, and Friday.     ? ?No current facility-administered medications on file prior to visit.  ? ? ? ?Review of Systems   ?Constitutional:  Negative for fever.  ?Respiratory:  Positive for shortness of breath (going up a hill only). Negative for cough and wheezing.   ?Cardiovascular:  Positive for leg swelling. Negative for chest pain and palpitations.  ?Gastrointestinal:  Negative for abdominal pain.  ?     No gerd  ?Neurological:  Negative for light-headedness and headaches.  ?Psychiatric/Behavioral:  Positive for sleep disturbance. The patient is nervous/anxious.   ? ?   ?Objective:  ? ?Vitals:  ? 08/09/21 1044  ?BP: 114/78  ?Pulse: 62  ?Temp: 97.9 ?F (36.6 ?C)  ?SpO2: 98%  ? ?BP Readings from Last 3 Encounters:  ?08/09/21 114/78  ?05/17/21 112/70  ?04/04/21 104/74  ? ?Wt Readings from Last 3 Encounters:  ?08/09/21 155 lb (70.3 kg)  ?05/17/21 161 lb 9.6 oz (73.3 kg)  ?04/04/21 159 lb 6.4 oz (72.3 kg)  ? ?Body mass index is 25.79 kg/m?. ? ?  ?Physical Exam ?Constitutional:   ?   General: She is not in acute distress. ?   Appearance: Normal appearance.  ?HENT:  ?   Head: Normocephalic and atraumatic.  ?Eyes:  ?   Conjunctiva/sclera: Conjunctivae normal.  ?Cardiovascular:  ?   Rate and Rhythm: Normal rate. Rhythm irregular.  ?   Heart sounds: Murmur (2/6) heard.  ?Pulmonary:  ?   Effort: Pulmonary effort is normal. No respiratory distress.  ?   Breath sounds: Normal breath sounds. No wheezing.  ?Musculoskeletal:  ?   Cervical back: Neck supple.  ?   Right lower leg: No edema.  ?   Left lower leg: No edema.  ?Lymphadenopathy:  ?   Cervical: No cervical adenopathy.  ?Skin: ?   General: Skin is warm and dry.  ?   Findings: No rash.  ?Neurological:  ?   Mental Status: She is alert. Mental status is at baseline.  ?Psychiatric:     ?   Mood and Affect: Mood normal.     ?   Behavior: Behavior normal.  ? ?   ? ?Lab Results  ?Component Value Date  ? WBC 8.6 03/05/2021  ? HGB 12.4 03/05/2021  ? HCT 37.6 03/05/2021  ? PLT 140.0 (L) 03/05/2021  ? GLUCOSE 90 07/13/2020  ? CHOL 177 07/13/2020  ? TRIG 82.0 07/13/2020  ? HDL 83.50 07/13/2020  ?  LDLDIRECT 144.0 11/29/2012  ? [place] 77 07/13/2020  ? ALT 24 01/17/2021  ? AST 31 01/17/2021  ? NA 136 07/13/2020  ? K 4.5 07/13/2020  ? CL 101 07/13/2020  ? CREATININE 0.90 07/16/2021  ? BUN 16 07/13/2020  ? CO2 28 07/13/2020  ?  TSH 2.370 01/17/2021  ? INR 1.0 03/06/2020  ? HGBA1C 5.9 07/13/2020  ? ? ? ?Assessment & Plan:  ? ? ?See Problem List for Assessment and Plan of chronic medical problems.  ? ? ?

## 2021-08-09 ENCOUNTER — Ambulatory Visit (INDEPENDENT_AMBULATORY_CARE_PROVIDER_SITE_OTHER): Payer: PPO | Admitting: Internal Medicine

## 2021-08-09 ENCOUNTER — Encounter: Payer: Self-pay | Admitting: Internal Medicine

## 2021-08-09 VITALS — BP 114/78 | HR 62 | Temp 97.9°F | Ht 65.0 in | Wt 155.0 lb

## 2021-08-09 DIAGNOSIS — E538 Deficiency of other specified B group vitamins: Secondary | ICD-10-CM

## 2021-08-09 DIAGNOSIS — I2510 Atherosclerotic heart disease of native coronary artery without angina pectoris: Secondary | ICD-10-CM

## 2021-08-09 DIAGNOSIS — I4821 Permanent atrial fibrillation: Secondary | ICD-10-CM

## 2021-08-09 DIAGNOSIS — Z8711 Personal history of peptic ulcer disease: Secondary | ICD-10-CM

## 2021-08-09 DIAGNOSIS — I700 Atherosclerosis of aorta: Secondary | ICD-10-CM

## 2021-08-09 DIAGNOSIS — E782 Mixed hyperlipidemia: Secondary | ICD-10-CM | POA: Diagnosis not present

## 2021-08-09 DIAGNOSIS — F419 Anxiety disorder, unspecified: Secondary | ICD-10-CM

## 2021-08-09 DIAGNOSIS — M8589 Other specified disorders of bone density and structure, multiple sites: Secondary | ICD-10-CM | POA: Diagnosis not present

## 2021-08-09 DIAGNOSIS — R7303 Prediabetes: Secondary | ICD-10-CM

## 2021-08-09 DIAGNOSIS — F5101 Primary insomnia: Secondary | ICD-10-CM

## 2021-08-09 DIAGNOSIS — I10 Essential (primary) hypertension: Secondary | ICD-10-CM | POA: Diagnosis not present

## 2021-08-09 LAB — COMPREHENSIVE METABOLIC PANEL
ALT: 23 U/L (ref 0–35)
AST: 37 U/L (ref 0–37)
Albumin: 4.5 g/dL (ref 3.5–5.2)
Alkaline Phosphatase: 90 U/L (ref 39–117)
BUN: 20 mg/dL (ref 6–23)
CO2: 29 mEq/L (ref 19–32)
Calcium: 9.4 mg/dL (ref 8.4–10.5)
Chloride: 102 mEq/L (ref 96–112)
Creatinine, Ser: 0.94 mg/dL (ref 0.40–1.20)
GFR: 54.96 mL/min — ABNORMAL LOW (ref >60.00)
Glucose, Bld: 82 mg/dL (ref 70–99)
Potassium: 4.3 mEq/L (ref 3.5–5.1)
Sodium: 138 mEq/L (ref 135–145)
Total Bilirubin: 0.7 mg/dL (ref 0.2–1.2)
Total Protein: 6.5 g/dL (ref 6.0–8.3)

## 2021-08-09 LAB — CBC WITH DIFFERENTIAL/PLATELET
Basophils Absolute: 0 10*3/uL (ref 0.0–0.1)
Basophils Relative: 0.5 % (ref 0.0–3.0)
Eosinophils Absolute: 0.1 10*3/uL (ref 0.0–0.7)
Eosinophils Relative: 1 % (ref 0.0–5.0)
HCT: 42 % (ref 36.0–46.0)
Hemoglobin: 13.9 g/dL (ref 12.0–15.0)
Lymphocytes Relative: 44.3 % (ref 12.0–46.0)
Lymphs Abs: 2.8 10*3/uL (ref 0.7–4.0)
MCHC: 33 g/dL (ref 30.0–36.0)
MCV: 95.2 fl (ref 78.0–100.0)
Monocytes Absolute: 0.6 10*3/uL (ref 0.1–1.0)
Monocytes Relative: 8.9 % (ref 3.0–12.0)
Neutro Abs: 2.9 10*3/uL (ref 1.4–7.7)
Neutrophils Relative %: 45.3 % (ref 43.0–77.0)
Platelets: 124 10*3/uL — ABNORMAL LOW (ref 150.0–400.0)
RBC: 4.41 Mil/uL (ref 3.87–5.11)
RDW: 14.9 % (ref 11.5–15.5)
WBC: 6.4 10*3/uL (ref 4.0–10.5)

## 2021-08-09 LAB — LIPID PANEL
Cholesterol: 199 mg/dL (ref 0–200)
HDL: 100.4 mg/dL (ref >39.00)
LDL Cholesterol: 82 mg/dL (ref 0–99)
NonHDL: 98.52
Total CHOL/HDL Ratio: 2
Triglycerides: 81 mg/dL (ref 0.0–149.0)
VLDL: 16.2 mg/dL (ref 0.0–40.0)

## 2021-08-09 LAB — VITAMIN B12: Vitamin B-12: 797 pg/mL (ref 211–911)

## 2021-08-09 LAB — VITAMIN D 25 HYDROXY (VIT D DEFICIENCY, FRACTURES): VITD: 46.5 ng/mL (ref 30.00–100.00)

## 2021-08-09 LAB — HEMOGLOBIN A1C: Hgb A1c MFr Bld: 6.1 % (ref 4.6–6.5)

## 2021-08-09 NOTE — Assessment & Plan Note (Signed)
Chronic ?Check B12 level ?

## 2021-08-09 NOTE — Assessment & Plan Note (Signed)
Chronic ?Following with cardiology ?Continue rosuvastatin 20 mg daily, metoprolol XL 25 mg daily ?

## 2021-08-09 NOTE — Assessment & Plan Note (Signed)
Chronic Check a1c Low sugar / carb diet Stressed regular exercise  

## 2021-08-09 NOTE — Assessment & Plan Note (Signed)
Chronic ?Osteopenia with high FRAX ?Stressed regular exercise ?Continue calcium and vitamin D daily ?Check vitamin D level ?Discussed treatment for osteoporosis ?

## 2021-08-09 NOTE — Assessment & Plan Note (Signed)
Chronic ?Continue Crestor 20 mg daily ?Continue continue heart healthy diet and regular exercise ?

## 2021-08-09 NOTE — Assessment & Plan Note (Signed)
Chronic ?Controlled, Stable ?Continue Xanax 0.5 mg twice daily as needed ?

## 2021-08-09 NOTE — Assessment & Plan Note (Signed)
Chronic ?Appointment ?Following with cardiology ?On Eliquis 5 mg twice daily, metoprolol XL 25 mg daily ?

## 2021-08-09 NOTE — Assessment & Plan Note (Signed)
Chronic ?Blood pressure well controlled ?CMP ?Continue metoprolol XL 25 mg daily ?

## 2021-08-09 NOTE — Assessment & Plan Note (Signed)
Chronic ?Controlled, Stable ?Continue chloral hydrate 10 mL nightly ?

## 2021-08-09 NOTE — Assessment & Plan Note (Signed)
History of peptic ulcer disease ?Denies reflux ?Continue pantoprazole 40 mg twice daily ?

## 2021-08-09 NOTE — Assessment & Plan Note (Signed)
Chronic Regular exercise and healthy diet encouraged Check lipid panel  Continue Crestor 20 mg daily 

## 2021-08-15 ENCOUNTER — Telehealth: Payer: Self-pay

## 2021-08-15 NOTE — Telephone Encounter (Signed)
Patient calling in as she had questions about a prescription for doxycycline ordered by Dr. Manuella Ghazi [facility]  ? ?Per notes - appears she was ordered doxycycline 50mg  BID #60 with 5 refills  ? ?Patient reports that she has been dealing with blepharitis and was prescribed doxycycline, systane, and white petrolatum- mineral oil ointment  ? ?-Reviewed with patient doxycycline, and possible drug interactions, advised to separate from Calcium, Zinc, and Mg dose by at least 2 hours from doxycycline dose ? ?Recommended for patient to limit sun expose while taking medication as well ? ?Patient will call office / Dr. Trena Platt office with any additional questions  ? ?Tomasa Blase, PharmD ?Clinical Pharmacist, [place]  ? ?

## 2021-08-19 ENCOUNTER — Encounter: Payer: Self-pay | Admitting: Cardiovascular Disease

## 2021-08-22 MED ORDER — FUROSEMIDE 20 MG PO TABS: 20.0000 mg | ORAL_TABLET | Freq: Every day | ORAL | 3 refills | Status: DC

## 2021-08-22 NOTE — Telephone Encounter (Signed)
Called and spoke to [place] who understands to keep on the 20mg  dose and will let us know if she has any fluid issues. Updated MAR to reflect correct daily dose. ?

## 2021-08-22 NOTE — Telephone Encounter (Signed)
Reviewed notes. Fine to stay on the furosemide 20 mg daily dose. thanks ?

## 2021-08-28 ENCOUNTER — Other Ambulatory Visit: Payer: Self-pay | Admitting: *Deleted

## 2021-08-28 ENCOUNTER — Ambulatory Visit (INDEPENDENT_AMBULATORY_CARE_PROVIDER_SITE_OTHER)
Admission: RE | Admit: 2021-08-28 | Discharge: 2021-08-28 | Disposition: A | Discharge status: Home / Self Care | Payer: PPO | Source: Ambulatory Visit | Attending: Internal Medicine | Admitting: Internal Medicine

## 2021-08-28 DIAGNOSIS — M8589 Other specified disorders of bone density and structure, multiple sites: Secondary | ICD-10-CM | POA: Diagnosis not present

## 2021-08-28 DIAGNOSIS — I4821 Permanent atrial fibrillation: Secondary | ICD-10-CM

## 2021-08-28 MED ORDER — APIXABAN 5 MG PO TABS: 5.0000 mg | ORAL_TABLET | Freq: Two times a day (BID) | ORAL | 2 refills | Status: DC

## 2021-08-28 NOTE — Telephone Encounter (Signed)
Eliquis 5mg  paper refill request received. Patient is 88 years old, weight-70.3kg, Crea-0.94 on 08/09/2021, Diagnosis-Afib, and last seen by Dr. Burt Knack on 05/17/2021. Dose is appropriate based on dosing criteria. Will send in refill to requested pharmacy.   ? ?Spoke with [place] at [facility] and they stated the RX will be filled via [place] then sent to them at [facility] and [facility] will get the medication to the pt. The local pharmacy only allows for monthly supply even when 90 day supply is sent and the mail order allows for 90 day at one time.  ?

## 2021-08-29 ENCOUNTER — Encounter: Payer: Self-pay | Admitting: Internal Medicine

## 2021-09-05 NOTE — Progress Notes (Signed)
? ? ? ? ?Subjective:  ? ? Patient ID: [name], female    DOB: 07/06/1932, 88 y.o.   MRN: [id number] ? ? ? ? ?HPI ?[name] is here for follow up of her chronic medical problems, including osteoporosis ?Her daughter, [name] was on the phone for part of the visit. ? ?Most recent bone density ?Results: ?  Lumbar spine L1-L4 Femoral neck (FN)  ?T-score  -1.0 RFN: -2.3 ?LFN: -2.1  ?Change in BMD from previous DXA test (%) Down 1.2% Down 4.6%   ?(*) statistically significant ?  ?Assessment: By the [facility] Criteria for diagnosis based on bone density, this patient has Low Bone Density  ?  ?  ?FRAX 10-year fracture risk calculator: 17.0 % for any major fracture and 6.0 % for hip fracture. ?Pharmacologic therapy is recommended if 10 year fracture risk is >20% for any major osteoporotic fracture or >3% for hip fracture.  ? ? ?Last vitamin D ?Lab Results  ?Component Value Date  ? VD25OH 46.50 08/09/2021  ? ? ?She is taking calcium and vitamin D daily.  She is exercising.  There was a time.  Last year and that she was not able to exercise secondary to medical problems. ? ? ? ? ?Medications and allergies reviewed with patient and updated if appropriate. ? ?Current Outpatient Medications on File Prior to Visit  ?Medication Sig Dispense Refill  ? acetaminophen (TYLENOL) 500 MG tablet Take 500-1,000 mg by mouth every 6 (six) hours as needed for mild pain or moderate pain.     ? ALPRAZolam (XANAX) 0.5 MG tablet TAKE 1 TABLET TWICE DAILY AS NEEDED FOR AN ANXIETY, DO NOT TAKE AT NIGHT. 180 tablet 0  ? amiodarone (PACERONE) 200 MG tablet Take 200 mg by mouth daily.    ? apixaban (ELIQUIS) 5 MG TABS tablet Take 1 tablet (5 mg total) by mouth 2 (two) times daily. 180 tablet 2  ? betamethasone dipropionate 0.05 % cream Apply topically.    ? Calcium Carb-Cholecalciferol 600-400 MG-UNIT TABS Take 1 tablet by mouth daily.    ? Chloral Hydrate CRYS Liquid 100 mg/ ml.    Take 10 mls PO HS prn 900 g 0  ? Cholecalciferol (VITAMIN D-3) 1000  UNITS CAPS Take 1,000 Units by mouth daily.     ? clindamycin (CLEOCIN) 300 MG capsule SMARTSIG:2 Capsule(s) By Mouth    ? COQ10 MAXIMUM STRENGTH 400 MG CAPS SMARTSIG:1 By Mouth    ? desonide (DESOWEN) 0.05 % cream     ? ferrous sulfate 325 (65 FE) MG tablet Take 325 mg by mouth 2 (two) times a week. Every Monday and Wednesday    ? furosemide (LASIX) 20 MG tablet Take 1 tablet (20 mg total) by mouth daily. 90 tablet 3  ? HYDROcodone bit-homatropine (HYCODAN) 5-1.5 MG/5ML syrup Take 5 mLs by mouth every 8 (eight) hours as needed for cough. 120 mL 0  ? ipratropium (ATROVENT) 0.06 % nasal spray Place 2 sprays into both nostrils in the morning and at bedtime.    ? levocetirizine (XYZAL) 5 MG tablet Take 5 mg by mouth at bedtime as needed for allergies.     ? Magnesium Oxide 500 MG CAPS     ? metoprolol succinate (TOPROL XL) 25 MG 24 hr tablet Take 1 tablet (25 mg total) by mouth daily. 90 tablet 3  ? montelukast (SINGULAIR) 10 MG tablet TAKE 1 TABLET EACH DAY. 90 tablet 1  ? nitroGLYCERIN (NITROSTAT) 0.4 MG SL tablet Place 1 tablet (0.4 mg  total) under the tongue every 5 (five) minutes as needed for chest pain (CP or SOB). 25 tablet 3  ? Ophthalmic Irrigation Solution (OCUSOFT EYE [place] OP) Place 1 application into both eyes at bedtime.    ? OVER THE COUNTER MEDICATION Apply 1 application topically daily as needed (pain). Hemp Cream    ? pantoprazole (PROTONIX) 40 MG tablet TAKE 1 TABLET BY MOUTH TWICE DAILY. 180 tablet 3  ? Polyethyl Glycol-Propyl Glycol (SYSTANE ULTRA) 0.4-0.3 % SOLN Place 1 drop into both eyes daily as needed (dry eyes).    ? polyethylene glycol (MIRALAX / GLYCOLAX) 17 g packet Take 17 g by mouth daily.    ? potassium chloride (KLOR-CON) 10 MEQ tablet Take 10 mEq by mouth daily.    ? rosuvastatin (CRESTOR) 20 MG tablet TAKE 1 TABLET ONCE DAILY. 90 tablet 3  ? tretinoin (RETIN-A) 0.05 % cream Apply topically.    ? tretinoin (RETIN-A) 0.1 % cream Apply 1 application topically every Monday, Wednesday,  and Friday.     ? ?No current facility-administered medications on file prior to visit.  ? ? ? ?Review of Systems ? ?   ?Objective:  ? ?Vitals:  ? 09/06/21 1300  ?BP: 118/70  ?Pulse: 68  ?Temp: 98.1 ?F (36.7 ?C)  ?SpO2: 94%  ? ?BP Readings from Last 3 Encounters:  ?09/06/21 118/70  ?08/09/21 114/78  ?05/17/21 112/70  ? ?Wt Readings from Last 3 Encounters:  ?09/06/21 161 lb (73 kg)  ?08/09/21 155 lb (70.3 kg)  ?05/17/21 161 lb 9.6 oz (73.3 kg)  ? ?Body mass index is 26.79 kg/m?. ? ?  ?Physical Exam ?   ? ?Lab Results  ?Component Value Date  ? WBC 6.4 08/09/2021  ? HGB 13.9 08/09/2021  ? HCT 42.0 08/09/2021  ? PLT 124.0 (L) 08/09/2021  ? GLUCOSE 82 08/09/2021  ? CHOL 199 08/09/2021  ? TRIG 81.0 08/09/2021  ? HDL 100.40 08/09/2021  ? LDLDIRECT 144.0 11/29/2012  ? [place] 82 08/09/2021  ? ALT 23 08/09/2021  ? AST 37 08/09/2021  ? NA 138 08/09/2021  ? K 4.3 08/09/2021  ? CL 102 08/09/2021  ? CREATININE 0.94 08/09/2021  ? BUN 20 08/09/2021  ? CO2 29 08/09/2021  ? TSH 2.370 01/17/2021  ? INR 1.0 03/06/2020  ? HGBA1C 6.1 08/09/2021  ? ? ? ?Assessment & Plan:  ? ? ?See Problem List for Assessment and Plan of chronic medical problems.  ? ? ?

## 2021-09-06 ENCOUNTER — Encounter: Payer: Self-pay | Admitting: Internal Medicine

## 2021-09-06 ENCOUNTER — Ambulatory Visit (INDEPENDENT_AMBULATORY_CARE_PROVIDER_SITE_OTHER): Payer: PPO | Admitting: Internal Medicine

## 2021-09-06 VITALS — BP 118/70 | HR 68 | Temp 98.1°F | Ht 65.0 in | Wt 161.0 lb

## 2021-09-06 DIAGNOSIS — M810 Age-related osteoporosis without current pathological fracture: Secondary | ICD-10-CM

## 2021-09-06 MED ORDER — ALENDRONATE SODIUM 70 MG PO TABS: 70.0000 mg | ORAL_TABLET | ORAL | 3 refills | Status: DC

## 2021-09-06 MED ORDER — CHLORPHENIRAMINE MALEATE 4 MG PO TABS: 4.0000 mg | ORAL_TABLET | ORAL | 1 refills | Status: DC | PRN

## 2021-09-06 NOTE — Patient Instructions (Addendum)
? ?Consider fosamax ( pill once a week) , reclast (infusion once a year) and prolia ( injection into the skin every 6 months in our office)     ? ? ?Medications changes include :   chlorpheniramine 4 mg every 4 hrs as needed for allergies ? ? ?Your prescription(s) have been sent to your pharmacy.  ? ? ? ? ? ?Osteoporosis ? ?Osteoporosis happens when the bones become thin and less dense than normal. Osteoporosis makes bones more brittle and fragile and more likely to break (fracture). ?Over time, osteoporosis can cause your bones to become so weak that they fracture after a minor fall. Bones in the hip, wrist, and spine are most likely to fracture due to osteoporosis. ?What are the causes? ?The exact cause of this condition is not known. ?What increases the risk? ?You are more likely to develop this condition if you: ?Have family members with this condition. ?Have poor nutrition. ?Use the following: ?Steroid medicines, such as prednisone. ?Anti-seizure medicines. ?Nicotine or tobacco, such as cigarettes, e-cigarettes, and chewing tobacco. ?Are female. ?Are age 30 or older. ?Are not physically active (are sedentary). ?Are of European or Asian descent. ?Have a small body frame. ?What are the signs or symptoms? ?A fracture might be the first sign of osteoporosis, especially if the fracture results from a fall or injury that usually would not cause a bone to break. Other signs and symptoms include: ?Pain in the neck or low back. ?Stooped posture. ?Loss of height. ?How is this diagnosed? ?This condition may be diagnosed based on: ?Your medical history. ?A physical exam. ?A bone mineral density test, also called a DXA or DEXA test (dual-energy X-ray absorptiometry test). This test uses X-rays to measure the amount of minerals in your bones. ?How is this treated? ?This condition may be treated by: ?Making lifestyle changes, such as: ?Including foods with more calcium and vitamin D in your diet. ?Doing weight-bearing and  muscle-strengthening exercises. ?Stopping tobacco use. ?Limiting alcohol intake. ?Taking medicine to slow the process of bone loss or to increase bone density. ?Taking daily supplements of calcium and vitamin D. ?Taking hormone replacement medicines, such as estrogen for women and testosterone for men. ?Monitoring your levels of calcium and vitamin D. ?The goal of treatment is to strengthen your bones and lower your risk for a fracture. ?Follow these instructions at home: ?Eating and drinking ?Include calcium and vitamin D in your diet. Calcium is important for bone health, and vitamin D helps your body absorb calcium. Good sources of calcium and vitamin D include: ?Certain fatty fish, such as salmon and tuna. ?Products that have calcium and vitamin D added to them (are fortified), such as fortified cereals. ?Egg yolks. ?Cheese. ?Liver. ? ?Activity ?Do exercises as told by your health care provider. Ask your health care provider what exercises and activities are safe for you. You should do: ?Exercises that make you work against gravity (weight-bearing exercises), such as tai chi, yoga, or walking. ?Exercises to strengthen muscles, such as lifting weights. ?Lifestyle ?Do not drink alcohol if: ?Your health care provider tells you not to drink. ?You are pregnant, may be pregnant, or are planning to become pregnant. ?If you drink alcohol: ?Limit how much you use to: ?0-1 drink a day for women. ?0-2 drinks a day for men. ?Know how much alcohol is in your drink. In the [place] one drink equals one 12 oz bottle of beer (355 mL), one 5 oz glass of wine (148 mL), or one 1? oz glass  of hard liquor (44 mL). ?Do not use any products that contain nicotine or tobacco, such as cigarettes, e-cigarettes, and chewing tobacco. If you need help quitting, ask your health care provider. ?Preventing falls ?Use devices to help you move around (mobility aids) as needed, such as canes, walkers, scooters, or crutches. ?Keep rooms well-lit and  clutter-free. ?Remove tripping hazards from walkways, including cords and throw rugs. ?Install grab bars in bathrooms and safety rails on stairs. ?Use rubber mats in the bathroom and other areas that are often wet or slippery. ?Wear closed-toe shoes that fit well and support your feet. Wear shoes that have rubber soles or low heels. ?Review your medicines with your health care provider. Some medicines can cause dizziness or changes in blood pressure, which can increase your risk of falling. ?General instructions ?Take over-the-counter and prescription medicines only as told by your health care provider. ?Keep all follow-up visits. This is important. ?Contact a health care provider if: ?You have never been screened for osteoporosis and you are: ?A woman who is age 10 or older. ?A man who is age 43 or older. ?Get help right away if: ?You fall or injure yourself. ?Summary ?Osteoporosis is thinning and loss of density in your bones. This makes bones more brittle and fragile and more likely to break (fracture),even with minor falls. ?The goal of treatment is to strengthen your bones and lower your risk for a fracture. ?Include calcium and vitamin D in your diet. Calcium is important for bone health, and vitamin D helps your body absorb calcium. ?Talk with your health care provider about screening for osteoporosis if you are a woman who is age 50 or older, or a man who is age 51 or older. ?This information is not intended to replace advice given to you by your health care provider. Make sure you discuss any questions you have with your health care provider. ?Document Revised: 09/29/2019 Document Reviewed: 09/29/2019 ?Elsevier Patient Education ? [employer]. ? ? ?

## 2021-09-07 NOTE — Assessment & Plan Note (Signed)
Chronic ?Most recent bone density shows continued osteoporosis that is worse compared to her prior DEXA scan.  She has severe osteopenia with high FRAX ?Reviewed most recent bone density and how compared to her previous 1 ?Discussed the importance of calcium and vitamin D daily.  Advised that she can also start vitamin K daily ?Stressed regular exercise-she will work on increasing her exercise ?Reviewed some of the medications, especially Fosamax, Reclast, Prolia and Forteo ?Discussed the mechanisms of action and side effects of the above ?She has decided to try Fosamax 70 mg weekly-discussed that if this does worsen her reflux we will stop it and consider a different option ?Prescription sent to pharmacy ?Can repeat DEXA in 1 year if you want to see how the Fosamax is working or 2 years ?

## 2021-09-19 ENCOUNTER — Ambulatory Visit: Payer: PPO | Admitting: Cardiovascular Disease

## 2021-09-20 ENCOUNTER — Ambulatory Visit: Payer: PPO | Admitting: Cardiovascular Disease

## 2021-09-29 ENCOUNTER — Encounter: Payer: Self-pay | Admitting: Internal Medicine

## 2021-09-30 ENCOUNTER — Ambulatory Visit (INDEPENDENT_AMBULATORY_CARE_PROVIDER_SITE_OTHER): Payer: PPO | Admitting: Cardiovascular Disease

## 2021-09-30 ENCOUNTER — Other Ambulatory Visit: Payer: Self-pay | Admitting: Internal Medicine

## 2021-09-30 ENCOUNTER — Encounter: Payer: Self-pay | Admitting: Cardiovascular Disease

## 2021-09-30 VITALS — BP 130/80 | HR 89 | Ht 65.0 in | Wt 160.8 lb

## 2021-09-30 DIAGNOSIS — I5032 Chronic diastolic (congestive) heart failure: Secondary | ICD-10-CM

## 2021-09-30 DIAGNOSIS — I4821 Permanent atrial fibrillation: Secondary | ICD-10-CM | POA: Diagnosis not present

## 2021-09-30 DIAGNOSIS — Z952 Presence of prosthetic heart valve: Secondary | ICD-10-CM | POA: Diagnosis not present

## 2021-09-30 DIAGNOSIS — T8203XD Leakage of heart valve prosthesis, subsequent encounter: Secondary | ICD-10-CM

## 2021-09-30 MED ORDER — METOPROLOL SUCCINATE ER 25 MG PO TB24: 25.0000 mg | ORAL_TABLET | Freq: Every day | ORAL | 3 refills | Status: DC

## 2021-09-30 MED ORDER — FUROSEMIDE 20 MG PO TABS: ORAL_TABLET | ORAL | 3 refills | Status: DC

## 2021-09-30 NOTE — Patient Instructions (Addendum)
Medication Instructions:  STOP Amiodarone if you are taking TAKE Metoprolol Succinate (Toprol XL) 25mg  daily **PLEASE double your Furosemide IF you have a 3lb or greater weight gain in 24 hours and continue this daily until back to baseline *If you need a refill on your cardiac medications before your next appointment, please call your pharmacy*  Lab Work: NONE If you have labs (blood work) drawn today and your tests are completely normal, you will receive your results only by: [place] (if you have MyChart) OR A paper copy in the mail If you have any lab test that is abnormal or we need to change your treatment, we will call you to review the results.  Testing/Procedures: NONE  Follow-Up: At [facility], you and your health needs are our priority.  As part of our continuing mission to provide you with exceptional heart care, we have created designated Provider Care Teams.  These Care Teams include your primary Cardiologist (physician) and Advanced Practice Providers (APPs -  Physician Assistants and Nurse Practitioners) who all work together to provide you with the care you need, when you need it.  Your next appointment:   6 month(s)  The format for your next appointment:   In Person  Provider:   [name], MD    Other Instructions   For your  leg edema you  should do  the following 1. Leg elevation - I recommend the Lounge Dr. Leg rest.  See below for details  2. Salt restriction  -  Use potassium chloride instead of regular salt as a salt substitute. 3. Walk regularly 4. Compression hose - Medical [employer] store  5. Weight loss    Available on [email or web address] Or  Go to [email or web address]      Important Information About Sugar

## 2021-09-30 NOTE — Progress Notes (Unsigned)
Cardiology Office Note:    Date:  09/30/2021   ID:  [name], DOB 10/21/1932, MRN [id number]  PCP:  [name], MD   [facility] Providers Cardiologist:  [name], MD     Referring MD: [name], MD   Chief Complaint  Patient presents with   Shortness of Breath    History of Present Illness:    [name] is a 88 y.o. female with a hx of: Persistent atrial fibrillation  S/p TEE-DCCV 11/2019 CHA2DS2-VASc=6 (age x 2, female, CAD, CHF, HTN) >> Apixaban   Failed cardioversion with amiodarone in 2022, now with persistent atrial fibrillation Bicuspid valve, aortic stenosis S/p bioprosthetic AVR in 2016 Paravalvular leak Normal coronary arteries prior to AVR Coronary artery disease S/p NSTEMI in 01/2019 >> total occlusion of distal LAD felt to be prob embolic event Ischemic CM, EF 45-50 w ant-apical AK TEE 8/21: EF 55 Breast CA Hypertension  Hyperlipidemia  Diastolic CHF   The patient is here alone today.  She reports stable symptoms of exertional dyspnea when walking up a hill.  She occasionally has swelling in her legs and hands and takes extra Lasix.  She has questions about taking another COVID-19 booster today.  She denies orthopnea, PND, chest pain, chest pressure, lightheadedness, or syncope.  There appears to be some confusion about her medications.  I went back and reviewed my notes from October 2022 when amiodarone was discontinued and she was started on metoprolol succinate 25 mg daily.  She thinks she is still taking amiodarone and she is not taking metoprolol succinate but she will confirm this when she is able to check her medications at home.  Past Medical History:  Diagnosis Date   Allergy    Anemia    Anxiety    Aortic valve stenosis, severe    s/p AVR // mild to mod paravalvular leakage // Echo 9/21: EF 55-60, no RWMA, mild LVH, GR 2 DD, mildly reduced RVSF, RVSP 38.7, moderate LAE, mild RAE, AVR with mean gradient 11 mmHg, mild  paravalvular leakage   Arthritis    Breast cancer ([place]) 2007   right   Cataract    Constipation    Dysrhythmia    A-fib   Hiatal hernia    pt denies   Hypercholesteremia    Hypertension    Hypoglycemia    Osteopenia    Personal history of radiation therapy    Tingling    Ulcer 2012   bleeding gastric Ulcer    Past Surgical History:  Procedure Laterality Date   AORTIC VALVE REPLACEMENT N/A 11/16/2014   Procedure: AORTIC VALVE REPLACEMENT (AVR);  Surgeon: Gaye Pollack, MD;  Location: [place];  Service: Open Heart Surgery;  Laterality: N/A;   BIOPSY  12/15/2017   Procedure: BIOPSY;  Surgeon: Rush Landmark Telford Nab., MD;  Location: [place];  Service: Gastroenterology;;   BIOPSY  01/10/2021   Procedure: BIOPSY;  Surgeon: Irving Copas., MD;  Location: [place];  Service: Gastroenterology;;   BREAST BIOPSY     BREAST LUMPECTOMY Right    2005?   BREAST SURGERY     BRONCHIAL BIOPSY  03/06/2020   Procedure: BRONCHIAL BIOPSIES;  Surgeon: Collene Gobble, MD;  Location: [facility] ENDOSCOPY;  Service: Pulmonary;;   BRONCHIAL BRUSHINGS  03/06/2020   Procedure: BRONCHIAL BRUSHINGS;  Surgeon: Collene Gobble, MD;  Location: [facility] ENDOSCOPY;  Service: Pulmonary;;   BRONCHIAL NEEDLE ASPIRATION BIOPSY  03/06/2020   Procedure: BRONCHIAL NEEDLE ASPIRATION BIOPSIES;  Surgeon: Collene Gobble, MD;  Location: [facility] ENDOSCOPY;  Service: Pulmonary;;   BRONCHIAL WASHINGS  03/06/2020   Procedure: BRONCHIAL WASHINGS;  Surgeon: Collene Gobble, MD;  Location: [facility] ENDOSCOPY;  Service: Pulmonary;;   CARDIAC CATHETERIZATION N/A 10/18/2014   Procedure: Right/Left Heart Cath and Coronary Angiography;  Surgeon: [name], MD;  Location: [place] CV LAB;  Service: Cardiovascular;  Laterality: N/A;   CARDIOVERSION N/A 12/09/2019   Procedure: CARDIOVERSION;  Surgeon: Josue Hector, MD;  Location: [place];  Service: Cardiovascular;  Laterality: N/A;   CARDIOVERSION N/A 12/30/2019   Procedure:  CARDIOVERSION;  Surgeon: Josue Hector, MD;  Location: [facility] ENDOSCOPY;  Service: Cardiovascular;  Laterality: N/A;   COLONOSCOPY WITH PROPOFOL N/A 01/10/2021   Procedure: COLONOSCOPY WITH PROPOFOL;  Surgeon: Irving Copas., MD;  Location: [place];  Service: Gastroenterology;  Laterality: N/A;   CORONARY ANGIOGRAPHY N/A 02/04/2019   Procedure: CORONARY ANGIOGRAPHY;  Surgeon: Nelva Bush, MD;  Location: [place] CV LAB;  Service: Cardiovascular;  Laterality: N/A;   COSMETIC SURGERY     face   ESOPHAGOGASTRODUODENOSCOPY (EGD) WITH PROPOFOL N/A 12/15/2017   Procedure: ESOPHAGOGASTRODUODENOSCOPY (EGD) WITH PROPOFOL;  Surgeon: Rush Landmark Telford Nab., MD;  Location: [place];  Service: Gastroenterology;  Laterality: N/A;   FIDUCIAL MARKER PLACEMENT  03/06/2020   Procedure: FIDUCIAL MARKER PLACEMENT;  Surgeon: Collene Gobble, MD;  Location: [facility] ENDOSCOPY;  Service: Pulmonary;;   PARATHYROIDECTOMY     TEE WITHOUT CARDIOVERSION N/A 11/16/2014   Procedure: TRANSESOPHAGEAL ECHOCARDIOGRAM (TEE);  Surgeon: Gaye Pollack, MD;  Location: [place];  Service: Open Heart Surgery;  Laterality: N/A;   TEE WITHOUT CARDIOVERSION N/A 12/09/2019   Procedure: TRANSESOPHAGEAL ECHOCARDIOGRAM (TEE);  Surgeon: Josue Hector, MD;  Location: [facility] ENDOSCOPY;  Service: Cardiovascular;  Laterality: N/A;   TONSILLECTOMY     VIDEO BRONCHOSCOPY WITH ENDOBRONCHIAL NAVIGATION Right 03/06/2020   Procedure: VIDEO BRONCHOSCOPY WITH ENDOBRONCHIAL NAVIGATION;  Surgeon: Collene Gobble, MD;  Location: [facility] ENDOSCOPY;  Service: Pulmonary;  Laterality: Right;    Current Medications: Current Meds  Medication Sig   acetaminophen (TYLENOL) 500 MG tablet Take 500-1,000 mg by mouth every 6 (six) hours as needed for mild pain or moderate pain.    alendronate (FOSAMAX) 70 MG tablet Take 1 tablet (70 mg total) by mouth every 7 (seven) days. Take with a full glass of water on an empty stomach.   ALPRAZolam (XANAX) 0.5 MG tablet  TAKE 1 TABLET TWICE DAILY AS NEEDED FOR AN ANXIETY, DO NOT TAKE AT NIGHT.   amiodarone (PACERONE) 200 MG tablet Take 200 mg by mouth daily. Per patient cutting tablet in half taking 100 mg.   apixaban (ELIQUIS) 5 MG TABS tablet Take 1 tablet (5 mg total) by mouth 2 (two) times daily.   Azelastine HCl 0.15 % SOLN 2 sprays twice a day   betamethasone dipropionate 0.05 % cream Apply topically.   Calcium Carb-Cholecalciferol 600-400 MG-UNIT TABS Take 1 tablet by mouth daily.   Chloral Hydrate CRYS Liquid 100 mg/ ml.    Take 10 mls PO HS prn   chlorpheniramine (CHLOR-TRIMETON) 4 MG tablet Take 1 tablet (4 mg total) by mouth every 4 (four) hours as needed for allergies.   Cholecalciferol (VITAMIN D-3) 1000 UNITS CAPS Take 1,000 Units by mouth daily.    clindamycin (CLEOCIN) 300 MG capsule Per patient taking prior to dental appointment   desonide (DESOWEN) 0.05 % cream    ferrous sulfate 325 (65 FE) MG tablet Take 325 mg by  mouth 2 (two) times a week. Every Monday and Wednesday   furosemide (LASIX) 20 MG tablet Take 1 tablet (20 mg total) by mouth daily.   HYDROcodone bit-homatropine (HYCODAN) 5-1.5 MG/5ML syrup Take 5 mLs by mouth every 8 (eight) hours as needed for cough.   ipratropium (ATROVENT) 0.06 % nasal spray Place 2 sprays into both nostrils in the morning and at bedtime.   levocetirizine (XYZAL) 5 MG tablet Take 5 mg by mouth at bedtime as needed for allergies.    Magnesium Oxide 500 MG CAPS Per patient 500 mg tablets cutting tablet in half taking 250 mg daily   montelukast (SINGULAIR) 10 MG tablet TAKE 1 TABLET EACH DAY.   nitroGLYCERIN (NITROSTAT) 0.4 MG SL tablet Place 1 tablet (0.4 mg total) under the tongue every 5 (five) minutes as needed for chest pain (CP or SOB).   Ophthalmic Irrigation Solution (OCUSOFT EYE [place] OP) Place 1 application into both eyes at bedtime.   OVER THE COUNTER MEDICATION Apply 1 application topically daily as needed (pain). Hemp Cream   pantoprazole (PROTONIX)  40 MG tablet TAKE 1 TABLET BY MOUTH TWICE DAILY.   Polyethyl Glycol-Propyl Glycol (SYSTANE ULTRA) 0.4-0.3 % SOLN Place 1 drop into both eyes daily as needed (dry eyes).   polyethylene glycol (MIRALAX / GLYCOLAX) 17 g packet Take 17 g by mouth daily.   potassium chloride (KLOR-CON) 10 MEQ tablet Take 10 mEq by mouth daily.   rosuvastatin (CRESTOR) 20 MG tablet TAKE 1 TABLET ONCE DAILY.   tretinoin (RETIN-A) 0.1 % cream Apply 1 application. topically 2 (two) times a week.   White Petrolatum-Mineral Oil (EYE LUBRICANT) OINT Place 1 application. into both eyes as needed.     Allergies:   Lactose intolerance (gi), Amoxicillin-pot clavulanate, Penicillins, Sulfa antibiotics, and Sulfonamide derivatives   Social History   Socioeconomic History   Marital status: Married    Spouse name: Not on file   Number of children: 3   Years of education: 16   Highest education level: Bachelor's degree (e.g., BA, AB, BS)  Occupational History   Occupation: Retired  Tobacco Use   Smoking status: Former    Packs/day: 0.50    Years: 15.00    Pack years: 7.50    Types: Cigarettes    Start date: 1971    Quit date: 1986    Years since quitting: 37.4   Smokeless tobacco: Never   Tobacco comments:    quit smoking 1980  Vaping Use   Vaping Use: Never used  Substance and Sexual Activity   Alcohol use: Yes    Alcohol/week: 7.0 standard drinks    Types: 7 Glasses of wine per week    Comment: wine nightly with dinner   Drug use: No   Sexual activity: Not Currently  Other Topics Concern   Not on file  Social History Narrative   ** Merged History Encounter **       Lives at [facility] with her husband. Right-handed. Caffeine use: 3-4 cups per day (some tea, mixes coffee with decaf).   Social Determinants of Health   Financial Resource Strain: Not on file  Food Insecurity: Not on file  Transportation Needs: Not on file  Physical Activity: Not on file  Stress: Not on file  Social Connections:  Not on file     Family History: The patient's family history includes Cancer (age of onset: 31) in her cousin; Colon cancer in her mother; Hypertension in her mother; Kidney disease in her father;  Rectal cancer in her mother. There is no history of Esophageal cancer, Stomach cancer, or Colon polyps.  ROS:   Please see the history of present illness.    All other systems reviewed and are negative.  EKGs/Labs/Other Studies Reviewed:    The following studies were reviewed today: Echocardiogram 06/07/2021: 1. In Afib wtih RVR during study. Left ventricular ejection fraction, by  estimation, is 60 to 65%      The left ventricle has normal function. The left ventricle has no  regional wall motion abnormalities. There is moderate asymmetric left  ventricular hypertrophy of the basal-septal segment. Left ventricular  diastolic parameters are indeterminate.   2. Right ventricular systolic function is mildly reduced. The right  ventricular size is mildly enlarged. There is moderately elevated  pulmonary artery systolic pressure. The estimated right ventricular  systolic pressure is 00.9 mmHg.   3. Right atrial size was moderately dilated.   4. The mitral valve is normal in structure. Mild mitral valve  regurgitation. No evidence of mitral stenosis.   5. Tricuspid valve regurgitation is mild to moderate.   6. There is a 21 mm Edwards Magna-Ease valve present in the aortic  position.      Paravalvular leak is not well visualized, but appears mild to  moderate. MG 14 mmHg, stable from prior echo. Vmax 2.5 m/s, MG 14 mmHg,  EOA 0.8 cm^2, DI 0.31   7. The inferior vena cava is dilated in size with >50% respiratory  variability, suggesting right atrial pressure of 8 mmHg.   EKG:  EKG is ordered today.  The ekg ordered today demonstrates atrial fibrillation 89 bpm, nonspecific T wave abnormality.  Recent Labs: 01/17/2021: TSH 2.370 08/09/2021: ALT 23; BUN 20; Creatinine, Ser 0.94; Hemoglobin  13.9; Platelets 124.0; Potassium 4.3; Sodium 138  Recent Lipid Panel    Component Value Date/Time   CHOL 199 08/09/2021 1133   TRIG 81.0 08/09/2021 1133   HDL 100.40 08/09/2021 1133   CHOLHDL 2 08/09/2021 1133   VLDL 16.2 08/09/2021 1133   LDLCALC 82 08/09/2021 1133   LDLDIRECT 144.0 11/29/2012 1601     Risk Assessment/Calculations:    CHA2DS2-VASc Score = 6  {Confirm score is correct.  If not, click here to update score.  REFRESH note.  :1} This indicates a 9.7% annual risk of stroke. The patient's score is based upon: CHF History: 1 HTN History: 1 Diabetes History: 0 Stroke History: 0 Vascular Disease History: 1 Age Score: 2 Gender Score: 1   {This patient has a significant risk of stroke if diagnosed with atrial fibrillation.  Please consider VKA or DOAC agent for anticoagulation if the bleeding risk is acceptable.   You can also use the SmartPhrase .Roma for documentation.   :[id number]}       Physical Exam:    VS:  BP 130/80   Pulse 89   Ht 5\' 5"  (1.651 m)   Wt 160 lb 12.8 oz (72.9 kg)   SpO2 97%   BMI 26.76 kg/m     Wt Readings from Last 3 Encounters:  09/30/21 160 lb 12.8 oz (72.9 kg)  09/06/21 161 lb (73 kg)  08/09/21 155 lb (70.3 kg)     GEN:  Well nourished, well developed in no acute distress HEENT: Normal NECK: No JVD; No carotid bruits LYMPHATICS: No lymphadenopathy CARDIAC: Irregularly irregular with grade 2/6 diastolic decrescendo murmur at the right upper sternal border and soft systolic ejection murmur also at the right upper sternal border RESPIRATORY:  Clear  to auscultation without rales, wheezing or rhonchi  ABDOMEN: Soft, non-tender, non-distended MUSCULOSKELETAL:  No edema; No deformity  SKIN: Warm and dry NEUROLOGIC:  Alert and oriented x 3 PSYCHIATRIC:  Normal affect   ASSESSMENT:    1. Permanent atrial fibrillation ([place])   2. S/P AVR (aortic valve replacement)   3. Paravalvular leak of prosthetic heart valve, subsequent  encounter   4. Chronic diastolic heart failure (HCC)    PLAN:    In order of problems listed above:  The patient will continue with a strategy of rate control and anticoagulation.  I have asked her to completely discontinue amiodarone and confirm that she is taking metoprolol succinate 25 mg daily.  I reviewed her most recent echocardiogram as above.  She has NYHA functional class II symptoms. Patient with history of surgical aortic valve replacement and stable paravalvular regurgitation.  Most recent echo from February reviewed.  We will repeat again in about 1 year. As above We discussed a low-sodium diet.  She is eating soup and will make sure that this is low-sodium.  I talked to her about a sliding scale where if she sees 3 pounds of weight gain she will double her Lasix until her weight is back to baseline.  Otherwise continue current medical therapy.           Medication Adjustments/Labs and Tests Ordered: Current medicines are reviewed at length with the patient today.  Concerns regarding medicines are outlined above.  No orders of the defined types were placed in this encounter.  No orders of the defined types were placed in this encounter.   There are no Patient Instructions on file for this visit.   Signed, [name], MD  09/30/2021 11:38 AM    [place]

## 2021-10-01 ENCOUNTER — Encounter: Payer: Self-pay | Admitting: Internal Medicine

## 2021-10-01 ENCOUNTER — Other Ambulatory Visit: Payer: Self-pay | Admitting: Cardiovascular Disease

## 2021-10-01 MED ORDER — CHLORAL HYDRATE CRYS: CRYSTALS | 0 refills | Status: DC

## 2021-10-01 NOTE — Telephone Encounter (Signed)
We never received only the alprazolam. I this ok to refill.Marland KitchenJohny Chess

## 2021-10-01 NOTE — Progress Notes (Signed)
Pt no longer taking.

## 2021-10-08 ENCOUNTER — Telehealth: Payer: Self-pay

## 2021-10-08 NOTE — Telephone Encounter (Signed)
This nurse reached out to patient related to survivorship appointment on 6/14.  Attempted to complete survivorship assessment and the patient refused.  Stated that she does not have time to do it at this time but she will be at the appointment on tomorrow.  No further questions or concerns at this time.

## 2021-10-09 ENCOUNTER — Inpatient Hospital Stay: Payer: PPO | Attending: Nurse Practitioner | Admitting: Nurse Practitioner

## 2021-10-09 ENCOUNTER — Encounter: Payer: Self-pay | Admitting: Nurse Practitioner

## 2021-10-09 ENCOUNTER — Other Ambulatory Visit: Payer: Self-pay

## 2021-10-09 ENCOUNTER — Other Ambulatory Visit: Payer: Self-pay | Admitting: Internal Medicine

## 2021-10-09 VITALS — BP 146/101 | HR 91 | Temp 97.4°F | Resp 16 | Wt 165.0 lb

## 2021-10-09 DIAGNOSIS — Z853 Personal history of malignant neoplasm of breast: Secondary | ICD-10-CM | POA: Diagnosis not present

## 2021-10-09 DIAGNOSIS — I509 Heart failure, unspecified: Secondary | ICD-10-CM | POA: Diagnosis not present

## 2021-10-09 DIAGNOSIS — Z85118 Personal history of other malignant neoplasm of bronchus and lung: Secondary | ICD-10-CM | POA: Diagnosis not present

## 2021-10-09 DIAGNOSIS — C3411 Malignant neoplasm of upper lobe, right bronchus or lung: Secondary | ICD-10-CM | POA: Diagnosis not present

## 2021-10-09 DIAGNOSIS — K6389 Other specified diseases of intestine: Secondary | ICD-10-CM | POA: Insufficient documentation

## 2021-10-09 DIAGNOSIS — I110 Hypertensive heart disease with heart failure: Secondary | ICD-10-CM | POA: Diagnosis not present

## 2021-10-09 DIAGNOSIS — M8580 Other specified disorders of bone density and structure, unspecified site: Secondary | ICD-10-CM | POA: Insufficient documentation

## 2021-10-09 NOTE — Progress Notes (Signed)
[facility]   [phone] [phone]   Clinic Follow up Note   Patient Care Team: Burns, Stacy J, MD as PCP - General (Internal Medicine) Cooper, Michael, MD as PCP - Cardiology (Cardiology) Pyrtle, Jay M, MD as Consulting Physician (Gastroenterology) Cooper, Michael, MD as Consulting Physician (Cardiology) Gudena, Vinay, MD as Consulting Physician (Hematology and Oncology) Shah, Rajiv E, MD as Referring Physician (Ophthalmology) Yan, Yijun, MD as Consulting Physician (Neurology) Mansouraty, Gabriel Jr., MD as Consulting Physician (Gastroenterology) Foltanski, Lindsey N, RPH as Pharmacist (Pharmacist) 10/09/2021  CHIEF COMPLAINT: Long term survivorship for history of right breast cancer  SUMMARY OF ONCOLOGIC HISTORY: Oncology History  History of right breast cancer  02/16/2004 Initial Diagnosis   Right breast mass biopsy: Invasive mammary cancer, MRI revealed 1.3 cm mass upper inner quadrant right breast   03/05/2004 Surgery   Right lumpectomy: 0.9 cm grade 1 invasive lobular cancer, ER positive, PR negative, HER-2 negative, Ki-67 5%, sentinel lymph nodes negative   04/08/2004 - 05/09/2004 Radiation Therapy   Adjuvant radiation therapy   06/10/2004 - 06/09/2009 Anti-estrogen oral therapy   MA-27 trial and placed on the Aromasin arm.  She completed one year of Aromasin, and it was discontinued due to severe joint aches and pains, followed by Tamoxifen for 2-3 years, and then Letrozole to complete out a total of 5 years     CURRENT THERAPY: Surveillance  INTERVAL HISTORY: [name] returns for follow-up as scheduled, last seen by me 10/09/2020.  Chest CT 06/2021 she has no evidence of active disease, followed by Allison Perkins, PA and rad onc.  Her main issue today is A-fib which she has had for about 3 years, uncontrolled.  Recently she has exertional dyspnea on flat and inclined surfaces with associated leg discomfort and 4 pounds weight gain in  a week.  She takes Lasix once a day, today she will start taking it twice a day to see if this helps.  She recently saw Dr. Cooper cardiology who took her off amiodarone and restarted metoprolol, she has not seen an improvement in the past week since this change.  Denies breast concerns such as new lump/mass, nipple discharge or inversion, or skin change.  Denies GI complaints.  Denies signs of infection, unintentional weight loss, fatigue, or bone pain.   MEDICAL HISTORY:  Past Medical History:  Diagnosis Date   Allergy    Anemia    Anxiety    Aortic valve stenosis, severe    s/p AVR // mild to mod paravalvular leakage // Echo 9/21: EF 55-60, no RWMA, mild LVH, GR 2 DD, mildly reduced RVSF, RVSP 38.7, moderate LAE, mild RAE, AVR with mean gradient 11 mmHg, mild paravalvular leakage   Arthritis    Breast cancer (HCC) 2007   right   Cataract    Constipation    Dysrhythmia    A-fib   Hiatal hernia    pt denies   Hypercholesteremia    Hypertension    Hypoglycemia    Osteopenia    Personal history of radiation therapy    Tingling    Ulcer 2012   bleeding gastric Ulcer    SURGICAL HISTORY: Past Surgical History:  Procedure Laterality Date   AORTIC VALVE REPLACEMENT N/A 11/16/2014   Procedure: AORTIC VALVE REPLACEMENT (AVR);  Surgeon: Bryan K Bartle, MD;  Location: [facility] OR;  Service: Open Heart Surgery;  Laterality: N/A;   BIOPSY  12/15/2017   Procedure: BIOPSY;  Surgeon: Mansouraty, Gabriel Jr., MD;  Location:   [place] [facility];  Service: Gastroenterology;;   BIOPSY  01/10/2021   Procedure: BIOPSY;  Surgeon: Rush Landmark Telford Nab., MD;  Location: [place];  Service: Gastroenterology;;   BREAST BIOPSY     BREAST LUMPECTOMY Right    2005?   BREAST SURGERY     BRONCHIAL BIOPSY  03/06/2020   Procedure: BRONCHIAL BIOPSIES;  Surgeon: Collene Gobble, MD;  Location: [facility] ENDOSCOPY;  Service: Pulmonary;;   BRONCHIAL BRUSHINGS  03/06/2020   Procedure: BRONCHIAL BRUSHINGS;  Surgeon: Collene Gobble, MD;  Location: [facility] ENDOSCOPY;  Service: Pulmonary;;   BRONCHIAL NEEDLE ASPIRATION BIOPSY  03/06/2020   Procedure: BRONCHIAL NEEDLE ASPIRATION BIOPSIES;  Surgeon: Collene Gobble, MD;  Location: [facility] ENDOSCOPY;  Service: Pulmonary;;   BRONCHIAL WASHINGS  03/06/2020   Procedure: BRONCHIAL WASHINGS;  Surgeon: Collene Gobble, MD;  Location: [facility] ENDOSCOPY;  Service: Pulmonary;;   CARDIAC CATHETERIZATION N/A 10/18/2014   Procedure: Right/Left Heart Cath and Coronary Angiography;  Surgeon: [name], MD;  Location: [place] [facility];  Service: Cardiovascular;  Laterality: N/A;   CARDIOVERSION N/A 12/09/2019   Procedure: CARDIOVERSION;  Surgeon: Josue Hector, MD;  Location: [place];  Service: Cardiovascular;  Laterality: N/A;   CARDIOVERSION N/A 12/30/2019   Procedure: CARDIOVERSION;  Surgeon: Josue Hector, MD;  Location: [facility] ENDOSCOPY;  Service: Cardiovascular;  Laterality: N/A;   COLONOSCOPY WITH PROPOFOL N/A 01/10/2021   Procedure: COLONOSCOPY WITH PROPOFOL;  Surgeon: Irving Copas., MD;  Location: [place];  Service: Gastroenterology;  Laterality: N/A;   CORONARY ANGIOGRAPHY N/A 02/04/2019   Procedure: CORONARY ANGIOGRAPHY;  Surgeon: Nelva Bush, MD;  Location: [place] [facility];  Service: Cardiovascular;  Laterality: N/A;   COSMETIC SURGERY     face   ESOPHAGOGASTRODUODENOSCOPY (EGD) WITH PROPOFOL N/A 12/15/2017   Procedure: ESOPHAGOGASTRODUODENOSCOPY (EGD) WITH PROPOFOL;  Surgeon: Rush Landmark Telford Nab., MD;  Location: [place];  Service: Gastroenterology;  Laterality: N/A;   FIDUCIAL MARKER PLACEMENT  03/06/2020   Procedure: FIDUCIAL MARKER PLACEMENT;  Surgeon: Collene Gobble, MD;  Location: [facility] ENDOSCOPY;  Service: Pulmonary;;   PARATHYROIDECTOMY     TEE WITHOUT CARDIOVERSION N/A 11/16/2014   Procedure: TRANSESOPHAGEAL ECHOCARDIOGRAM (TEE);  Surgeon: Gaye Pollack, MD;  Location: [place];  Service: Open Heart Surgery;  Laterality: N/A;   TEE WITHOUT  CARDIOVERSION N/A 12/09/2019   Procedure: TRANSESOPHAGEAL ECHOCARDIOGRAM (TEE);  Surgeon: Josue Hector, MD;  Location: [facility] ENDOSCOPY;  Service: Cardiovascular;  Laterality: N/A;   TONSILLECTOMY     VIDEO BRONCHOSCOPY WITH ENDOBRONCHIAL NAVIGATION Right 03/06/2020   Procedure: VIDEO BRONCHOSCOPY WITH ENDOBRONCHIAL NAVIGATION;  Surgeon: Collene Gobble, MD;  Location: [facility] ENDOSCOPY;  Service: Pulmonary;  Laterality: Right;    I have reviewed the social history and family history with the patient and they are unchanged from previous note.  ALLERGIES:  is allergic to lactose intolerance (gi), amoxicillin-pot clavulanate, penicillins, sulfa antibiotics, and sulfonamide derivatives.  MEDICATIONS:  Current Outpatient Medications  Medication Sig Dispense Refill   acetaminophen (TYLENOL) 500 MG tablet Take 500-1,000 mg by mouth every 6 (six) hours as needed for mild pain or moderate pain.      alendronate (FOSAMAX) 70 MG tablet Take 1 tablet (70 mg total) by mouth every 7 (seven) days. Take with a full glass of water on an empty stomach. 12 tablet 3   ALPRAZolam (XANAX) 0.5 MG tablet TAKE 1 TABLET TWICE DAILY AS NEEDED FOR AN ANXIETY, DO NOT TAKE AT NIGHT. 180 tablet 0   apixaban (ELIQUIS) 5 MG TABS  tablet Take 1 tablet (5 mg total) by mouth 2 (two) times daily. 180 tablet 2   Azelastine HCl 0.15 % SOLN 2 sprays twice a day     betamethasone dipropionate 0.05 % cream Apply topically.     Calcium Carb-Cholecalciferol 600-400 MG-UNIT TABS Take 1 tablet by mouth daily.     Chloral Hydrate CRYS Liquid 100 mg/ ml.    Take 10 mls PO HS prn 900 g 0   chlorpheniramine (CHLOR-TRIMETON) 4 MG tablet Take 1 tablet (4 mg total) by mouth every 4 (four) hours as needed for allergies. 90 tablet 1   Cholecalciferol (VITAMIN D-3) 1000 UNITS CAPS Take 1,000 Units by mouth daily.      clindamycin (CLEOCIN) 300 MG capsule Per patient taking prior to dental appointment     desonide (DESOWEN) 0.05 % cream      ferrous  sulfate 325 (65 FE) MG tablet Take 325 mg by mouth 2 (two) times a week. Every Monday and Wednesday     furosemide (LASIX) 20 MG tablet Take 1 tablet by mouth daily. If greater than 3 pound gain in 24 hours, increase to 2 tablets daily until weight returns to normal 90 tablet 3   HYDROcodone bit-homatropine (HYCODAN) 5-1.5 MG/5ML syrup Take 5 mLs by mouth every 8 (eight) hours as needed for cough. 120 mL 0   ipratropium (ATROVENT) 0.06 % nasal spray Place 2 sprays into both nostrils in the morning and at bedtime.     levocetirizine (XYZAL) 5 MG tablet Take 5 mg by mouth at bedtime as needed for allergies.      levocetirizine (XYZAL) 5 MG tablet 1 tablet in the evening (Patient not taking: Reported on 09/30/2021)     LORazepam (ATIVAN) 0.5 MG tablet as needed.     Magnesium Oxide 500 MG CAPS Per patient 500 mg tablets cutting tablet in half taking 250 mg daily     metoprolol succinate (TOPROL XL) 25 MG 24 hr tablet Take 1 tablet (25 mg total) by mouth daily. 90 tablet 3   montelukast (SINGULAIR) 10 MG tablet TAKE 1 TABLET EACH DAY. 90 tablet 1   nitroGLYCERIN (NITROSTAT) 0.4 MG SL tablet Place 1 tablet (0.4 mg total) under the tongue every 5 (five) minutes as needed for chest pain (CP or SOB). 25 tablet 3   Ophthalmic Irrigation Solution (OCUSOFT EYE WASH OP) Place 1 application into both eyes at bedtime.     OVER THE COUNTER MEDICATION Apply 1 application topically daily as needed (pain). Hemp Cream     pantoprazole (PROTONIX) 40 MG tablet TAKE 1 TABLET BY MOUTH TWICE DAILY. 180 tablet 3   Polyethyl Glycol-Propyl Glycol (SYSTANE ULTRA) 0.4-0.3 % SOLN Place 1 drop into both eyes daily as needed (dry eyes).     polyethylene glycol (MIRALAX / GLYCOLAX) 17 g packet Take 17 g by mouth daily.     potassium chloride (KLOR-CON) 10 MEQ tablet Take 10 mEq by mouth daily.     rosuvastatin (CRESTOR) 20 MG tablet TAKE 1 TABLET ONCE DAILY. 90 tablet 3   tretinoin (RETIN-A) 0.05 % cream Apply topically. (Patient  not taking: Reported on 09/30/2021)     tretinoin (RETIN-A) 0.1 % cream Apply 1 application. topically 2 (two) times a week.     White Petrolatum-Mineral Oil (EYE LUBRICANT) OINT Place 1 application. into both eyes as needed.     No current facility-administered medications for this visit.    PHYSICAL EXAMINATION: ECOG PERFORMANCE STATUS: 1 - Symptomatic but completely ambulatory    Vitals:   10/09/21 1024  BP: (!) 146/101  Pulse: 91  Resp: 16  Temp: (!) 97.4 F (36.3 C)  SpO2: 97%   Repeat BP in clinic 139/87  Filed Weights   10/09/21 1024  Weight: 165 lb (74.8 kg)    GENERAL:alert, no distress and comfortable SKIN: No rash EYES: sclera clear LYMPH:  no palpable cervical or supraclavicular lymphadenopathy LUNGS: clear with normal breathing effort HEART: A-fib. no lower extremity edema ABDOMEN:abdomen soft, non-tender and normal bowel sounds NEURO: alert & oriented x 3 with fluent speech, no focal motor/sensory deficits Breast exam: Breasts are symmetrical without nipple discharge or inversion.  S/p right lumpectomy, incisions completely healed.  No palpable mass or nodularity in either breast or axilla that I could appreciate.  LABORATORY DATA:  I have reviewed the data as listed    Latest Ref Rng & Units 08/09/2021   11:33 AM 03/05/2021   12:58 PM 07/13/2020   11:58 AM  CBC  WBC 4.0 - 10.5 K/uL 6.4  8.6  6.5   Hemoglobin 12.0 - 15.0 g/dL 13.9  12.4  13.1   Hematocrit 36.0 - 46.0 % 42.0  37.6  39.0   Platelets 150.0 - 400.0 K/uL 124.0  140.0  147.0         Latest Ref Rng & Units 08/09/2021   11:33 AM 07/16/2021   10:46 AM 04/11/2021   11:05 AM  CMP  Glucose 70 - 99 mg/dL 82     BUN 6 - 23 mg/dL 20     Creatinine 0.40 - 1.20 mg/dL 0.94  0.90  1.00   Sodium 135 - 145 mEq/L 138     Potassium 3.5 - 5.1 mEq/L 4.3     Chloride 96 - 112 mEq/L 102     CO2 19 - 32 mEq/L 29     Calcium 8.4 - 10.5 mg/dL 9.4     Total Protein 6.0 - 8.3 g/dL 6.5     Total Bilirubin 0.2 -  1.2 mg/dL 0.7     Alkaline Phos 39 - 117 U/L 90     AST 0 - 37 U/L 37     ALT 0 - 35 U/L 23         RADIOGRAPHIC STUDIES: I have personally reviewed the radiological images as listed and agreed with the findings in the report. No results found.   ASSESSMENT & PLAN: Ms.. Mcclaine is a pleasant 88 y.o. female with history of stage IA right breast invasive lobular carcinoma, ER+/PR+/HER2-, diagnosed in 2005, treated in [place] with lumpectomy, adjuvant radiation therapy, and anti-estrogen therapy x5 years completed in 2011.    1.  History of lobular breast cancer:  Ms. Waln is clinically doing well.  Breast exam is benign, CBC and CMP 07/2021 are stable except mild thrombocytopenia.  Bilateral screening mammogram from 06/05/2021 is negative.  Overall there is no clinical concern for breast cancer recurrence.  She prefers to continue long-term follow-up in our survivorship clinic.  She will return in 1 year.  She knows to contact the office sooner if she develops new concerns or questions.   2.  Right lung adenocarcinoma/NSCLC, [id number], stage I (02/2020) -She was not felt to be a good surgical candidate, Dr. Lamonte Sakai referred for RT -S/p SBRT per Dr. Lisbeth Renshaw and Shona Simpson, PA -CT chest CT q. 3-6 months per Worthy Flank, PA, last scan 3/23 showed no evidence of recurrent/active disease   3.  A-fib, ?  CHF  -She reports 3-year history  of A-fib, her main presentation is irregular rhythm and leg discomfort.  She is not aware of a heart failure diagnosis. -She reports a 4 pound weight gain in 1 week, which is consistent with CHF -Last echo 05/2021 with stable -Recent med adjustments per cardiologist Dr. Cooper; stopped Amio and restarted metoprolol.  Continues Lasix p.o. once daily -Due to recent weight gain reasonable to increase Lasix to 1 tab twice daily for 3-5 days then back to once per day -I encouraged her to reach out to cardiologist  4. Colon pneumatosis -seen on f/up CT chest 09/2020  for treated R lung cancer and confirmed on colonoscopy 01/10/2021 by Dr. Mansouraty -No plans to repeat colonoscopy due to age   5. Bone health: Last DEXA 08/28/2021 showed osteopenia with high FRAX score, she began Fosamax.  Continue calcium, vitamin D, and weightbearing exercise   6. Cancer screening:  She has family history of breast and ovarian cancer, patient is reportedly BRCA negative via genetic testing over 15 years ago at an independent lab.  I do not have access to the report.  She continues breast cancer screening, lung cancer surveillance CTs, and skin checks.  She aged out of colonoscopy with her last screening in 12/2020.    7. Health maintenance and wellness promotion: Ms. Okuda was encouraged to consume 5-7 servings of fruits and vegetables per day and engage in exercise.  She was instructed to limit her alcohol consumption and continue to abstain from tobacco use   8. Support services/counseling: no indication for SW referral at this time.    Plan: -Continue breast and lung cancer surveillance -Next CT chest 12/2021 and mammogram 05/2022 -Continue PCP and cardiology follow-up -Long-term survivorship visit in 1 year, or sooner if needed   All questions were answered. The patient knows to call the clinic with any problems, questions or concerns. No barriers to learning was detected. I spent 20 minutes counseling the patient face to face. The total time spent in the appointment was 30 minutes and more than 50% was on counseling and review of test results      K , NP 10/09/21     

## 2021-10-23 ENCOUNTER — Encounter: Payer: Self-pay | Admitting: Internal Medicine

## 2021-10-30 NOTE — Telephone Encounter (Signed)
Patient is scheduled for AWV

## 2021-11-02 ENCOUNTER — Other Ambulatory Visit: Payer: Self-pay | Admitting: Internal Medicine

## 2021-11-05 ENCOUNTER — Other Ambulatory Visit: Payer: Self-pay | Admitting: Gastroenterology

## 2021-11-06 ENCOUNTER — Ambulatory Visit (INDEPENDENT_AMBULATORY_CARE_PROVIDER_SITE_OTHER): Payer: PPO

## 2021-11-06 DIAGNOSIS — Z0000 Encounter for general adult medical examination without abnormal findings: Secondary | ICD-10-CM

## 2021-11-06 NOTE — Patient Instructions (Signed)
It was great speaking with you today   Please follow up in a year

## 2021-11-06 NOTE — Progress Notes (Addendum)
Subjective:   [name] is a 88 y.o. female who presents for Medicare Annual (Subsequent) preventive examination.  Review of Systems           Objective:    There were no vitals filed for this visit. There is no height or weight on file to calculate BMI.     04/15/2021    8:24 AM 03/30/2021   11:58 AM 03/02/2021   12:07 PM 01/10/2021   11:14 AM 10/09/2020    9:52 AM 04/03/2020   10:18 AM 03/06/2020    9:33 AM  Advanced Directives  Does Patient Have a Medical Advance Directive? Yes No No No Yes Yes Yes  Type of Advance Directive Living will;Healthcare Power of [place];Living will [place];Living will [place];Living will  Does patient want to make changes to medical advance directive?     No - Patient declined No - Patient declined   Copy of [place] in Chart?     Yes - validated most recent copy scanned in chart (See row information) No - copy requested   Would patient like information on creating a medical advance directive?  No - Patient declined No - Patient declined  No - Patient declined No - Patient declined     Current Medications (verified) Outpatient Encounter Medications as of 11/06/2021  Medication Sig   acetaminophen (TYLENOL) 500 MG tablet Take 500-1,000 mg by mouth every 6 (six) hours as needed for mild pain or moderate pain.    alendronate (FOSAMAX) 70 MG tablet Take 1 tablet (70 mg total) by mouth every 7 (seven) days. Take with a full glass of water on an empty stomach.   ALPRAZolam (XANAX) 0.5 MG tablet TAKE 1 TABLET TWICE DAILY AS NEEDED FOR AN ANXIETY, DO NOT TAKE AT NIGHT.   apixaban (ELIQUIS) 5 MG TABS tablet Take 1 tablet (5 mg total) by mouth 2 (two) times daily.   Azelastine HCl 0.15 % SOLN 2 sprays twice a day   betamethasone dipropionate 0.05 % cream Apply topically.   Calcium Carb-Cholecalciferol 600-400 MG-UNIT TABS Take 1 tablet by mouth daily.    Chloral Hydrate CRYS Liquid 100 mg/ ml.    Take 10 mls PO HS prn   chlorpheniramine (CHLOR-TRIMETON) 4 MG tablet Take 1 tablet (4 mg total) by mouth every 4 (four) hours as needed for allergies.   Cholecalciferol (VITAMIN D-3) 1000 UNITS CAPS Take 1,000 Units by mouth daily.    clindamycin (CLEOCIN) 300 MG capsule Per patient taking prior to dental appointment   desonide (DESOWEN) 0.05 % cream    ferrous sulfate 325 (65 FE) MG tablet Take 325 mg by mouth 2 (two) times a week. Every Monday and Wednesday   furosemide (LASIX) 20 MG tablet Take 1 tablet by mouth daily. If greater than 3 pound gain in 24 hours, increase to 2 tablets daily until weight returns to normal   HYDROcodone bit-homatropine (HYCODAN) 5-1.5 MG/5ML syrup Take 5 mLs by mouth every 8 (eight) hours as needed for cough.   ipratropium (ATROVENT) 0.06 % nasal spray Place 2 sprays into both nostrils in the morning and at bedtime.   levocetirizine (XYZAL) 5 MG tablet Take 5 mg by mouth at bedtime as needed for allergies.    levocetirizine (XYZAL) 5 MG tablet    LORazepam (ATIVAN) 0.5 MG tablet as needed.   Magnesium Oxide 500 MG CAPS Per patient 500 mg tablets cutting tablet  in half taking 250 mg daily   metoprolol succinate (TOPROL XL) 25 MG 24 hr tablet Take 1 tablet (25 mg total) by mouth daily.   montelukast (SINGULAIR) 10 MG tablet TAKE 1 TABLET EACH DAY.   nitroGLYCERIN (NITROSTAT) 0.4 MG SL tablet Place 1 tablet (0.4 mg total) under the tongue every 5 (five) minutes as needed for chest pain (CP or SOB).   Ophthalmic Irrigation Solution (OCUSOFT EYE [place] OP) Place 1 application into both eyes at bedtime.   OVER THE COUNTER MEDICATION Apply 1 application topically daily as needed (pain). Hemp Cream   pantoprazole (PROTONIX) 40 MG tablet TAKE 1 TABLET BY MOUTH TWICE DAILY.   Polyethyl Glycol-Propyl Glycol (SYSTANE ULTRA) 0.4-0.3 % SOLN Place 1 drop into both eyes daily as needed (dry eyes).   polyethylene glycol (MIRALAX / GLYCOLAX)  17 g packet Take 17 g by mouth daily.   potassium chloride (KLOR-CON) 10 MEQ tablet Take 10 mEq by mouth daily.   rosuvastatin (CRESTOR) 20 MG tablet TAKE 1 TABLET ONCE DAILY.   tretinoin (RETIN-A) 0.05 % cream Apply topically.   tretinoin (RETIN-A) 0.1 % cream Apply 1 application. topically 2 (two) times a week.   White Petrolatum-Mineral Oil (EYE LUBRICANT) OINT Place 1 application. into both eyes as needed.   No facility-administered encounter medications on file as of 11/06/2021.    Allergies (verified) Lactose intolerance (gi), Amoxicillin-pot clavulanate, Penicillins, Sulfa antibiotics, and Sulfonamide derivatives   History: Past Medical History:  Diagnosis Date   Allergy    Anemia    Anxiety    Aortic valve stenosis, severe    s/p AVR // mild to mod paravalvular leakage // Echo 9/21: EF 55-60, no RWMA, mild LVH, GR 2 DD, mildly reduced RVSF, RVSP 38.7, moderate LAE, mild RAE, AVR with mean gradient 11 mmHg, mild paravalvular leakage   Arthritis    Breast cancer ([place]) 2007   right   Cataract    Constipation    Dysrhythmia    A-fib   Hiatal hernia    pt denies   Hypercholesteremia    Hypertension    Hypoglycemia    Osteopenia    Personal history of radiation therapy    Tingling    Ulcer 2012   bleeding gastric Ulcer   Past Surgical History:  Procedure Laterality Date   AORTIC VALVE REPLACEMENT N/A 11/16/2014   Procedure: AORTIC VALVE REPLACEMENT (AVR);  Surgeon: Gaye Pollack, MD;  Location: [place];  Service: Open Heart Surgery;  Laterality: N/A;   BIOPSY  12/15/2017   Procedure: BIOPSY;  Surgeon: Rush Landmark Telford Nab., MD;  Location: [place];  Service: Gastroenterology;;   BIOPSY  01/10/2021   Procedure: BIOPSY;  Surgeon: Irving Copas., MD;  Location: [place];  Service: Gastroenterology;;   BREAST BIOPSY     BREAST LUMPECTOMY Right    2005?   BREAST SURGERY     BRONCHIAL BIOPSY  03/06/2020   Procedure: BRONCHIAL BIOPSIES;  Surgeon: Collene Gobble, MD;  Location: [facility] ENDOSCOPY;  Service: Pulmonary;;   BRONCHIAL BRUSHINGS  03/06/2020   Procedure: BRONCHIAL BRUSHINGS;  Surgeon: Collene Gobble, MD;  Location: [facility] ENDOSCOPY;  Service: Pulmonary;;   BRONCHIAL NEEDLE ASPIRATION BIOPSY  03/06/2020   Procedure: BRONCHIAL NEEDLE ASPIRATION BIOPSIES;  Surgeon: Collene Gobble, MD;  Location: [place];  Service: Pulmonary;;   BRONCHIAL WASHINGS  03/06/2020   Procedure: BRONCHIAL WASHINGS;  Surgeon: Collene Gobble, MD;  Location: [facility] ENDOSCOPY;  Service: Pulmonary;;   CARDIAC CATHETERIZATION N/A  10/18/2014   Procedure: Right/Left Heart Cath and Coronary Angiography;  Surgeon: [name], MD;  Location: [place] CV LAB;  Service: Cardiovascular;  Laterality: N/A;   CARDIOVERSION N/A 12/09/2019   Procedure: CARDIOVERSION;  Surgeon: Josue Hector, MD;  Location: [place];  Service: Cardiovascular;  Laterality: N/A;   CARDIOVERSION N/A 12/30/2019   Procedure: CARDIOVERSION;  Surgeon: Josue Hector, MD;  Location: [facility] ENDOSCOPY;  Service: Cardiovascular;  Laterality: N/A;   COLONOSCOPY WITH PROPOFOL N/A 01/10/2021   Procedure: COLONOSCOPY WITH PROPOFOL;  Surgeon: Irving Copas., MD;  Location: [place];  Service: Gastroenterology;  Laterality: N/A;   CORONARY ANGIOGRAPHY N/A 02/04/2019   Procedure: CORONARY ANGIOGRAPHY;  Surgeon: Nelva Bush, MD;  Location: [place] CV LAB;  Service: Cardiovascular;  Laterality: N/A;   COSMETIC SURGERY     face   ESOPHAGOGASTRODUODENOSCOPY (EGD) WITH PROPOFOL N/A 12/15/2017   Procedure: ESOPHAGOGASTRODUODENOSCOPY (EGD) WITH PROPOFOL;  Surgeon: Rush Landmark Telford Nab., MD;  Location: [place];  Service: Gastroenterology;  Laterality: N/A;   FIDUCIAL MARKER PLACEMENT  03/06/2020   Procedure: FIDUCIAL MARKER PLACEMENT;  Surgeon: Collene Gobble, MD;  Location: [facility] ENDOSCOPY;  Service: Pulmonary;;   PARATHYROIDECTOMY     TEE WITHOUT CARDIOVERSION N/A 11/16/2014   Procedure:  TRANSESOPHAGEAL ECHOCARDIOGRAM (TEE);  Surgeon: Gaye Pollack, MD;  Location: [place];  Service: Open Heart Surgery;  Laterality: N/A;   TEE WITHOUT CARDIOVERSION N/A 12/09/2019   Procedure: TRANSESOPHAGEAL ECHOCARDIOGRAM (TEE);  Surgeon: Josue Hector, MD;  Location: [facility] ENDOSCOPY;  Service: Cardiovascular;  Laterality: N/A;   TONSILLECTOMY     VIDEO BRONCHOSCOPY WITH ENDOBRONCHIAL NAVIGATION Right 03/06/2020   Procedure: VIDEO BRONCHOSCOPY WITH ENDOBRONCHIAL NAVIGATION;  Surgeon: Collene Gobble, MD;  Location: [facility] ENDOSCOPY;  Service: Pulmonary;  Laterality: Right;   Family History  Problem Relation Age of Onset   Hypertension Mother    Rectal cancer Mother    Colon cancer Mother    Kidney disease Father    Cancer Cousin 81       breast and ovarian    Esophageal cancer Neg Hx    Stomach cancer Neg Hx    Colon polyps Neg Hx    Social History   Socioeconomic History   Marital status: Married    Spouse name: Not on file   Number of children: 3   Years of education: 16   Highest education level: Bachelor's degree (e.g., BA, AB, BS)  Occupational History   Occupation: Retired  Tobacco Use   Smoking status: Former    Packs/day: 0.50    Years: 15.00    Total pack years: 7.50    Types: Cigarettes    Start date: 54    Quit date: 1986    Years since quitting: 37.5   Smokeless tobacco: Never   Tobacco comments:    quit smoking 1980  Vaping Use   Vaping Use: Never used  Substance and Sexual Activity   Alcohol use: Yes    Alcohol/week: 7.0 standard drinks of alcohol    Types: 7 Glasses of wine per week    Comment: wine nightly with dinner   Drug use: No   Sexual activity: Not Currently  Other Topics Concern   Not on file  Social History Narrative   ** Merged History Encounter **       Lives at [facility] with her husband. Right-handed. Caffeine use: 3-4 cups per day (some tea, mixes coffee with decaf).   Social Determinants of Health   Financial  Resource Strain:  Low Risk  (11/06/2021)   Overall Financial Resource Strain (CARDIA)    Difficulty of Paying Living Expenses: Not hard at all  Food Insecurity: No Food Insecurity (11/06/2021)   Hunger Vital Sign    Worried About Running Out of Food in the Last Year: Never true    Ran Out of Food in the Last Year: Never true  Transportation Needs: No Transportation Needs (11/06/2021)   PRAPARE - Hydrologist (Medical): No    Lack of Transportation (Non-Medical): No  Physical Activity: Sufficiently Active (11/23/2018)   Exercise Vital Sign    Days of Exercise per Week: 6 days    Minutes of Exercise per Session: 30 min  Stress: No Stress Concern Present (11/06/2021)   [place]    Feeling of Stress : Not at all  Social Connections: [place] (11/06/2021)   Social Connection and Isolation Panel [NHANES]    Frequency of Communication with Friends and Family: More than three times a week    Frequency of Social Gatherings with Friends and Family: More than three times a week    Attends Religious Services: 1 to 4 times per year    Active Member of [employer] or Organizations: Yes    Attends Archivist Meetings: Not on file    Marital Status: Married    Tobacco Counseling Counseling given: Not Answered Tobacco comments: quit smoking 1980   Clinical Intake:                 Diabetic?No         Activities of Daily Living    11/06/2021    3:17 PM  In your present state of health, do you have any difficulty performing the following activities:  Hearing? 0  Vision? 0  Difficulty concentrating or making decisions? 0  Walking or climbing stairs? 0  Dressing or bathing? 0  Doing errands, shopping? 0    Patient Care Team: [name], MD as PCP - General (Internal Medicine) [name], MD as PCP - Cardiology (Cardiology) Pyrtle, Lajuan Lines, MD as Consulting Physician  (Gastroenterology) [name], MD as Consulting Physician (Cardiology) Nicholas Lose, MD as Consulting Physician (Hematology and Oncology) Feliz Beam, MD as Referring Physician (Ophthalmology) Marcial Pacas, MD as Consulting Physician (Neurology) Mansouraty, Telford Nab., MD as Consulting Physician (Gastroenterology) Charlton Haws, [facility] as Pharmacist (Pharmacist) [name], MD as Consulting Physician (Internal Medicine)  Indicate any recent Medical Services you may have received from other than [facility] providers in the past year (date may be approximate).     Assessment:   This is a routine wellness examination for [place].  Hearing/Vision screen No results found.  Dietary issues and exercise activities discussed:     Goals Addressed   None   Depression Screen    11/06/2021    3:19 PM 11/06/2021    3:18 PM 12/23/2019   10:43 AM 11/23/2018    9:36 AM 10/02/2017    2:09 PM 09/29/2016   10:31 AM 09/29/2016   10:30 AM  PHQ 2/9 Scores  PHQ - 2 Score 0 0 0 1 1 0 0  PHQ- 9 Score 0 0   3      Fall Risk    11/06/2021    3:18 PM 02/07/2021   10:25 AM 12/23/2019   10:43 AM 11/23/2018    9:36 AM 10/02/2017    2:09 PM  Fall Risk  Falls in the past year? 0 0 0 0 No  Number falls in past yr: 0 0  0   Injury with Fall? 0 0     Risk for fall due to :  No Fall Risks  Impaired balance/gait   Follow up  Falls evaluation completed  Falls prevention discussed     FALL RISK PREVENTION PERTAINING TO THE HOME:  Any stairs in or around the home? No  If so, are there any without handrails? No  Home free of loose throw rugs in walkways, pet beds, electrical cords, etc? No  Adequate lighting in your home to reduce risk of falls? Yes   ASSISTIVE DEVICES UTILIZED TO PREVENT FALLS:  Life alert? No  Use of a cane, walker or w/c? No  Grab bars in the bathroom? No  Shower chair or bench in shower? No  Elevated toilet seat or a handicapped toilet? No   TIMED UP AND GO:  Was the test  performed? No .  Length of time to ambulate 10 feet:   sec.   Gait steady and fast without use of assistive device  Cognitive Function:    10/02/2017    2:09 PM  MMSE - Mini Mental State Exam  Orientation to time 5  Orientation to Place 5  Registration 3  Attention/ Calculation 5  Recall 2  Language- name 2 objects 2  Language- repeat 1  Language- follow 3 step command 3  Language- read & follow direction 1  Write a sentence 1  Copy design 1  Total score 29        11/06/2021    3:17 PM  6CIT Screen  What Year? 0 points  What month? 0 points  What time? 0 points  Count back from 20 0 points  Months in reverse 0 points  Repeat phrase 0 points  Total Score 0 points    Immunizations Immunization History  Administered Date(s) Administered   Fluad Quad(high Dose 65+) 12/23/2018, 02/29/2020, 02/07/2021   Influenza Split 01/29/2011, 02/10/2012   Influenza Whole 01/27/2004, 02/02/2007, 02/08/2008, 02/09/2009, 01/16/2010   Influenza, High Dose Seasonal PF 01/29/2016, 01/27/2017, 01/15/2018, 08/08/2019, 02/26/2020   Influenza,inj,Quad PF,6+ Mos 01/18/2013, 01/26/2014, 01/23/2015   Moderna SARS-COV2 Booster Vaccination 03/14/2020   Moderna Sars-Covid-2 Vaccination 05/09/2019, 06/07/2019, 08/08/2019   Pneumococcal Conjugate-13 02/21/2015   Pneumococcal Polysaccharide-23 04/28/2004   Td 04/28/2005   Tdap 09/29/2016   Zoster Recombinat (Shingrix) 10/30/2017, 03/22/2018   Zoster, Live 04/28/2009    TDAP status: Up to date  Flu Vaccine status: Up to date  Pneumococcal vaccine status: Up to date  Covid-19 vaccine status: Completed vaccines  Qualifies for Shingles Vaccine? Yes   Zostavax completed Yes   Shingrix Completed?: Yes  Screening Tests Health Maintenance  Topic Date Due   COVID-19 Vaccine (4 - Booster for Moderna series) 05/09/2020   INFLUENZA VACCINE  11/26/2021   MAMMOGRAM  06/05/2022   DEXA SCAN  08/29/2023   TETANUS/TDAP  09/30/2026   Pneumonia  Vaccine 76+ Years old  Completed   Zoster Vaccines- Shingrix  Completed   HPV VACCINES  Aged Out   COLONOSCOPY (Pts 45-5yrs Insurance coverage will need to be confirmed)  Discontinued    Health Maintenance  Health Maintenance Due  Topic Date Due   COVID-19 Vaccine (4 - Booster for Moderna series) 05/09/2020    Colorectal cancer screening: Type of screening: Colonoscopy. Completed 01/10/2021. Repeat every 10 years  Mammogram status: Completed 06/05/2021. Repeat every year  Bone Density status: Completed 08/28/2021.  Results reflect: Bone density results: OSTEOPENIA. Repeat every 2 years.  Lung Cancer Screening: (Low Dose CT Chest recommended if Age 6-80 years, 30 pack-year currently smoking OR have quit w/in 15years.) does not qualify.   Lung Cancer Screening Referral:   Additional Screening:  Hepatitis C Screening: does qualify; Completed Needs to be completed   Vision Screening: Recommended annual ophthalmology exams for early detection of glaucoma and other disorders of the eye. Is the patient up to date with their annual eye exam?  Yes  Who is the provider or what is the name of the office in which the patient attends annual eye exams? Yes If pt is not established with a provider, would they like to be referred to a provider to establish care? No .   Dental Screening: Recommended annual dental exams for proper oral hygiene  Community Resource Referral / Chronic Care Management: CRR required this visit?  No   CCM required this visit?  No      Plan:     I have personally reviewed and noted the following in the patient's chart:   Medical and social history Use of alcohol, tobacco or illicit drugs  Current medications and supplements including opioid prescriptions.  Functional ability and status Nutritional status Physical activity Advanced directives List of other physicians Hospitalizations, surgeries, and ER visits in previous 12 months Vitals Screenings to  include cognitive, depression, and falls Referrals and appointments  In addition, I have reviewed and discussed with patient certain preventive protocols, quality metrics, and best practice recommendations. A written personalized care plan for preventive services as well as general preventive health recommendations were provided to patient.     Thomes Cake, [place]   11/06/2021   Nurse Notes: No concerns. Patient was advised that if any concerns come up to give our office a call.

## 2021-12-23 ENCOUNTER — Telehealth: Payer: Self-pay | Admitting: *Deleted

## 2021-12-23 NOTE — Telephone Encounter (Signed)
CALLED PATIENT TO INFORM OF STAT LABS ON 01-17-22 PRIOR TO CT ON 01-17-22- ARRIVAL TIME- 11:30 AM @ [facility], PATIENT TO HAVE WATER ONLY- 4 HRS. PRIOR TO TEST, PATIENT TO RECEIVE RESULTS FROM ALISON PERKINS ON 01-21-22 @ 8:30 AM VIA TELEPHONE , LVM FOR A RETURN CALL

## 2021-12-23 NOTE — Telephone Encounter (Signed)
RETURNED PATIENT'S PHONE CALL, SPOKE WITH PATIENT. ?

## 2021-12-24 ENCOUNTER — Telehealth: Payer: Self-pay | Admitting: *Deleted

## 2021-12-24 NOTE — Telephone Encounter (Signed)
Returned patient's phone call, test has been rescheduled per patient request, labs and CT will be done on 01-13-22- arrival time- 9:30 am @ [facility], patient to have water only- 4 hrs. prior to test, patient to receive results from Shona Simpson via telephone on 01-21-22 @ 8:30 am, spoke with patient and she is aware of these appts. and the instructions

## 2021-12-30 ENCOUNTER — Encounter: Payer: Self-pay | Admitting: Internal Medicine

## 2021-12-31 MED ORDER — CHLORAL HYDRATE CRYS
CRYSTALS | 5 refills | Status: DC
Start: 2021-12-31 — End: 2022-09-30

## 2021-12-31 NOTE — Telephone Encounter (Signed)
Is this ok for refill...l,mb

## 2022-01-01 ENCOUNTER — Telehealth: Payer: Self-pay

## 2022-01-01 NOTE — Telephone Encounter (Signed)
[facility] to clarify if [place] is the Rx that was needed as they normally fill chloral hydrate 100 MG/ML SUSP oral solution.  Please call Pharmacy back to clarify.

## 2022-01-01 NOTE — Telephone Encounter (Signed)
Yes, please switch.  We cannot order chloral hydrate any other way

## 2022-01-02 NOTE — Telephone Encounter (Signed)
Spoke with pharmacy today and script updated.

## 2022-01-07 ENCOUNTER — Encounter: Payer: Self-pay | Admitting: Internal Medicine

## 2022-01-13 ENCOUNTER — Ambulatory Visit (HOSPITAL_COMMUNITY)
Admission: RE | Admit: 2022-01-13 | Discharge: 2022-01-13 | Disposition: A | Discharge status: Home / Self Care | Payer: PPO | Source: Ambulatory Visit | Attending: Radiation Oncology | Admitting: Radiation Oncology

## 2022-01-13 DIAGNOSIS — M8000XA Age-related osteoporosis with current pathological fracture, unspecified site, initial encounter for fracture: Secondary | ICD-10-CM | POA: Diagnosis present

## 2022-01-13 DIAGNOSIS — C3411 Malignant neoplasm of upper lobe, right bronchus or lung: Secondary | ICD-10-CM | POA: Diagnosis not present

## 2022-01-13 LAB — POCT I-STAT CREATININE: Creatinine, Ser: 0.8 mg/dL (ref 0.44–1.00)

## 2022-01-13 MED ORDER — IOHEXOL 300 MG/ML  SOLN
75.0000 mL | Freq: Once | INTRAMUSCULAR | Status: AC | PRN
  Administered 2022-01-13: 75 mL via INTRAVENOUS

## 2022-01-13 MED ORDER — SODIUM CHLORIDE (PF) 0.9 % IJ SOLN
INTRAMUSCULAR | Status: AC
  Filled 2022-01-13: qty 50

## 2022-01-14 ENCOUNTER — Encounter: Payer: Self-pay | Admitting: Cardiovascular Disease

## 2022-01-17 ENCOUNTER — Other Ambulatory Visit (HOSPITAL_COMMUNITY): Payer: PPO

## 2022-01-20 ENCOUNTER — Telehealth: Payer: Self-pay

## 2022-01-20 ENCOUNTER — Encounter: Payer: Self-pay | Admitting: Radiation Oncology

## 2022-01-20 ENCOUNTER — Ambulatory Visit: Payer: Self-pay | Admitting: Radiation Oncology

## 2022-01-20 NOTE — Progress Notes (Signed)
Telephone appointment. I verified patient's identity and began nursing interview. Patient reports a high stress level 9/10, in reference to caring for her husband's healthcare needs. No other issues reported at this time.  Meaningful use complete.  Patient aware of her 8:30am-01/21/22 telephone appointment w/ Shona Simpson PA-C. I left my extension [phone] in case patient needs anything. Patient verbalized understanding.  Patient contact [phone]

## 2022-01-20 NOTE — Telephone Encounter (Signed)
Voicemail left for pt. in reference to 01/21/22's telephone appointment. I left my extension [phone] and requested that pt. return my call in reference to completing the nursing portion.

## 2022-01-21 ENCOUNTER — Ambulatory Visit
Admission: RE | Admit: 2022-01-21 | Discharge: 2022-01-21 | Disposition: A | Discharge status: Home / Self Care | Payer: PPO | Source: Ambulatory Visit | Attending: Radiation Oncology | Admitting: Radiation Oncology

## 2022-01-21 DIAGNOSIS — C3411 Malignant neoplasm of upper lobe, right bronchus or lung: Secondary | ICD-10-CM

## 2022-01-21 NOTE — Progress Notes (Signed)
Radiation Oncology         ([phone]) [phone] ________________________________   Name: [name]        MRN: [id number]  Date of Service: 01/21/2022 DOB: [birth year]  [id number], Claudina Lick, MD  [name], MD     REFERRING PHYSICIAN: [name], MD   DIAGNOSIS: The encounter diagnosis was Malignant neoplasm of upper lobe of right lung ([place]).   HISTORY OF PRESENT ILLNESS: [name] is a 88 y.o. female with a history of Stage IA2, cT1bN0M0, NSCLC, adenocarcinoma of the RUL.  The patient has a history of right breast cancer treated in [place], [place] with lumpectomy and adjuvant radiotherapy about 15 years ago. She also has a history of valvular disease and underwent an aortic valve replacement in July 2016 by Dr. Nell Range, as well as atrial fibrillation and diastolic CHF. The patient has been followed by pulmonary medicine for a right upper lobe pulmonary nodule that was originally identified in May 2018, notes indicate that there had been a chest wall lipoma and this CT scan was performed due to this work-up. Over time, though it was unchanged in overall size there was new solid component in the medial aspect measuring up to 4 mm, no evidence of adenopathy was identified, and she was counseled on the rationale for bronchoscopy which was performed on 03/06/2020. Biopsies and cytology of the right upper lobe from fine-needle aspiration and brushing were consistent with malignant cells morphologically consistent with an adenocarcinoma.  Atypical cells were also seen in the right upper lobe lavage.  She did undergo PET imaging on 03/30/2020, the right upper lobe nodule seen previously by CT showed an SUV max of 1.9, associated fiducial markers were seen, and no additional hypermetabolic changes were identified in the lung or within the lymph node regions.  No other hypermetabolic activity was noted throughout the body.    She proceeded with stereotactic body radiotherapy (SBRT) in 3 fractions  which she completed to the RUL target in December 2021. She's been followed in surveillance since. Interestingly her surveilance scans have identified pneumatosis of the colon. She underwent work up with additional imaging and met with Dr. Rush Landmark regarding these findings. A Cologuard was positive but at the time of colonoscopy in September 2022 she had focal changes of the bowel wall consistent with her pneumatosis and a transverse polyp that was consistent with tubular adenoma. Dr. Rush Landmark does not recommend repeat endoscopy. She has been followed without new or active disease in the lungs.  She had short interval scan in March 2023 due to inflammatory changes following respiratory illness.  This did resolve.  She is contacted to review her most recent scan which was performed on 01/13/2022 and shows expected postradiation changes in the area of the right upper lobe with no other suspicious pulmonary nodules or masses.  Cardiomegaly with biatrial dilatation and atherosclerotic changes were seen of the aorta.  She continues to have chronic pneumatosis in the splenic flexure of the colon which is stable in comparison to prior imaging.  She is contacted today by phone to review these results.  PREVIOUS RADIATION THERAPY:    04/18/20-04/26/20 SBRT Treatment: The RUL target was treated to 54 Gy in 3 fractions  About 2006: The patient received adjuvant right breast radiotherapy. Details are not known at this point about her dosing.   PAST MEDICAL HISTORY:  Past Medical History:  Diagnosis Date   Allergy    Anemia    Anxiety  Aortic valve stenosis, severe    s/p AVR // mild to mod paravalvular leakage // Echo 9/21: EF 55-60, no RWMA, mild LVH, GR 2 DD, mildly reduced RVSF, RVSP 38.7, moderate LAE, mild RAE, AVR with mean gradient 11 mmHg, mild paravalvular leakage   Arthritis    Breast cancer ([place]) 2007   right   Cataract    Constipation    Dysrhythmia    A-fib   Hiatal hernia    pt  denies   Hypercholesteremia    Hypertension    Hypoglycemia    Osteopenia    Personal history of radiation therapy    Tingling    Ulcer 2012   bleeding gastric Ulcer       PAST SURGICAL HISTORY: Past Surgical History:  Procedure Laterality Date   AORTIC VALVE REPLACEMENT N/A 11/16/2014   Procedure: AORTIC VALVE REPLACEMENT (AVR);  Surgeon: Gaye Pollack, MD;  Location: [place];  Service: Open Heart Surgery;  Laterality: N/A;   BIOPSY  12/15/2017   Procedure: BIOPSY;  Surgeon: Rush Landmark Telford Nab., MD;  Location: [place];  Service: Gastroenterology;;   BIOPSY  01/10/2021   Procedure: BIOPSY;  Surgeon: Irving Copas., MD;  Location: [place];  Service: Gastroenterology;;   BREAST BIOPSY     BREAST LUMPECTOMY Right    2005?   BREAST SURGERY     BRONCHIAL BIOPSY  03/06/2020   Procedure: BRONCHIAL BIOPSIES;  Surgeon: Collene Gobble, MD;  Location[facility] END[facility]COPY;  Service: Pulmonary;;   BRONCHIAL BRUSHINGS  03/06/2020   Procedure: BRONCHIAL BRUSHINGS;  Surgeon: Collene Gobble, MD;  Location: [facility] ENDOSCOPY;  Service: Pulmonary;;   BRONCHIAL NEEDLE ASPIRATION BIOPSY  03/06/2020   Procedure: BRONCHIAL NEEDLE ASPIRATION BIOPSIES;  Surgeon: Collene Gobble, MD;  Location: [facility] ENDOSCOPY;  Service: Pulmonary;;   BRONCHIAL WASHINGS  03/06/2020   Procedure: BRONCHIAL WASHINGS;  Surgeon: Collene Gobble, MD;  Location: [facility] ENDOSCOPY;  Service: Pulmonary;;   CARDIAC CATHETERIZATION N/A 10/18/2014   Procedure: Right/Left Heart Cath and Coronary Angiography;  Surgeon: [name], MD;  Location: [place] CV LAB;  Service: Cardiovascular;  Laterality: N/A;   CARDIOVERSION N/A 12/09/2019   Procedure: CARDIOVERSION;  Surgeon: Josue Hector, MD;  Location: [place];  Service: Cardiovascular;  Laterality: N/A;   CARDIOVERSION N/A 12/30/2019   Procedure: CARDIOVERSION;  Surgeon: Josue Hector, MD;  Location: [facility] ENDOSCOPY;  Service: Cardiovascular;  Laterality: N/A;   COLONOSCOPY  WITH PROPOFOL N/A 01/10/2021   Procedure: COLONOSCOPY WITH PROPOFOL;  Surgeon: Irving Copas., MD;  Location: [place];  Service: Gastroenterology;  Laterality: N/A;   CORONARY ANGIOGRAPHY N/A 02/04/2019   Procedure: CORONARY ANGIOGRAPHY;  Surgeon: Nelva Bush, MD;  Location: [place] CV LAB;  Service: Cardiovascular;  Laterality: N/A;   COSMETIC SURGERY     face   ESOPHAGOGASTRODUODENOSCOPY (EGD) WITH PROPOFOL N/A 12/15/2017   Procedure: ESOPHAGOGASTRODUODENOSCOPY (EGD) WITH PROPOFOL;  Surgeon: Rush Landmark Telford Nab., MD;  Location: [place];  Service: Gastroenterology;  Laterality: N/A;   FIDUCIAL MARKER PLACEMENT  03/06/2020   Procedure: FIDUCIAL MARKER PLACEMENT;  Surgeon: Collene Gobble, MD;  Location: [facility] ENDOSCOPY;  Service: Pulmonary;;   PARATHYROIDECTOMY     TEE WITHOUT CARDIOVERSION N/A 11/16/2014   Procedure: TRANSESOPHAGEAL ECHOCARDIOGRAM (TEE);  Surgeon: Gaye Pollack, MD;  Location: [place];  Service: Open Heart Surgery;  Laterality: N/A;   TEE WITHOUT CARDIOVERSION N/A 12/09/2019   Procedure: TRANSESOPHAGEAL ECHOCARDIOGRAM (TEE);  Surgeon: Josue Hector, MD;  Location: [facility] ENDOSCOPY;  Service:  Cardiovascular;  Laterality: N/A;   TONSILLECTOMY     VIDEO BRONCHOSCOPY WITH ENDOBRONCHIAL NAVIGATION Right 03/06/2020   Procedure: VIDEO BRONCHOSCOPY WITH ENDOBRONCHIAL NAVIGATION;  Surgeon: Collene Gobble, MD;  Location: [facility] ENDOSCOPY;  Service: Pulmonary;  Laterality: Right;     FAMILY HISTORY:  Family History  Problem Relation Age of Onset   Hypertension Mother    Rectal cancer Mother    Colon cancer Mother    Kidney disease Father    Cancer Cousin 78       breast and ovarian    Esophageal cancer Neg Hx    Stomach cancer Neg Hx    Colon polyps Neg Hx      SOCIAL HISTORY:  reports that she quit smoking about 37 years ago. Her smoking use included cigarettes. She started smoking about 52 years ago. She has a 7.50 pack-year smoking history. She has never  used smokeless tobacco. She reports current alcohol use of about 7.0 standard drinks of alcohol per week. She reports that she does not use drugs.  The patient is married and lives in [place] in independent living at [facility].     ALLERGIES: Lactose intolerance (gi), Amoxicillin-pot clavulanate, Penicillins, Sulfa antibiotics, and Sulfonamide derivatives   MEDICATIONS:  Current Outpatient Medications  Medication Sig Dispense Refill   acetaminophen (TYLENOL) 500 MG tablet Take 500-1,000 mg by mouth every 6 (six) hours as needed for mild pain or moderate pain.      alendronate (FOSAMAX) 70 MG tablet Take 1 tablet (70 mg total) by mouth every 7 (seven) days. Take with a full glass of water on an empty stomach. 12 tablet 3   ALPRAZolam (XANAX) 0.5 MG tablet TAKE 1 TABLET TWICE DAILY AS NEEDED FOR AN ANXIETY, DO NOT TAKE AT NIGHT. 180 tablet 0   apixaban (ELIQUIS) 5 MG TABS tablet Take 1 tablet (5 mg total) by mouth 2 (two) times daily. 180 tablet 2   Azelastine HCl 0.15 % SOLN 2 sprays twice a day     betamethasone dipropionate 0.05 % cream Apply topically.     Calcium Carb-Cholecalciferol 600-400 MG-UNIT TABS Take 1 tablet by mouth daily.     Chloral Hydrate CRYS Take 10 mls by mouth at bedtime as needed 900 g 5   chlorpheniramine (CHLOR-TRIMETON) 4 MG tablet Take 1 tablet (4 mg total) by mouth every 4 (four) hours as needed for allergies. 90 tablet 1   Cholecalciferol (VITAMIN D-3) 1000 UNITS CAPS Take 1,000 Units by mouth daily.      clindamycin (CLEOCIN) 300 MG capsule Per patient taking prior to dental appointment     desonide (DESOWEN) 0.05 % cream      ferrous sulfate 325 (65 FE) MG tablet Take 325 mg by mouth 2 (two) times a week. Every Monday and Wednesday     furosemide (LASIX) 20 MG tablet Take 1 tablet by mouth daily. If greater than 3 pound gain in 24 hours, increase to 2 tablets daily until weight returns to normal 90 tablet 3   HYDROcodone bit-homatropine (HYCODAN) 5-1.5 MG/5ML  syrup Take 5 mLs by mouth every 8 (eight) hours as needed for cough. 120 mL 0   ipratropium (ATROVENT) 0.06 % nasal spray Place 2 sprays into both nostrils in the morning and at bedtime.     levocetirizine (XYZAL) 5 MG tablet Take 5 mg by mouth at bedtime as needed for allergies.      levocetirizine (XYZAL) 5 MG tablet      LORazepam (ATIVAN) 0.5 MG  tablet as needed.     Magnesium Oxide 500 MG CAPS Per patient 500 mg tablets cutting tablet in half taking 250 mg daily     metoprolol succinate (TOPROL XL) 25 MG 24 hr tablet Take 1 tablet (25 mg total) by mouth daily. (Patient not taking: Reported on 01/20/2022) 90 tablet 3   montelukast (SINGULAIR) 10 MG tablet TAKE 1 TABLET EACH DAY. 90 tablet 1   nitroGLYCERIN (NITROSTAT) 0.4 MG SL tablet Place 1 tablet (0.4 mg total) under the tongue every 5 (five) minutes as needed for chest pain (CP or SOB). 25 tablet 3   Ophthalmic Irrigation Solution (OCUSOFT EYE [place] OP) Place 1 application into both eyes at bedtime.     OVER THE COUNTER MEDICATION Apply 1 application topically daily as needed (pain). Hemp Cream     pantoprazole (PROTONIX) 40 MG tablet TAKE 1 TABLET BY MOUTH TWICE DAILY. 180 tablet 3   Polyethyl Glycol-Propyl Glycol (SYSTANE ULTRA) 0.4-0.3 % SOLN Place 1 drop into both eyes daily as needed (dry eyes).     polyethylene glycol (MIRALAX / GLYCOLAX) 17 g packet Take 17 g by mouth daily.     potassium chloride (KLOR-CON) 10 MEQ tablet Take 10 mEq by mouth daily.     rosuvastatin (CRESTOR) 20 MG tablet TAKE 1 TABLET ONCE DAILY. 90 tablet 3   tretinoin (RETIN-A) 0.05 % cream Apply topically.     tretinoin (RETIN-A) 0.1 % cream Apply 1 application. topically 2 (two) times a week.     White Petrolatum-Mineral Oil (EYE LUBRICANT) OINT Place 1 application. into both eyes as needed.     No current facility-administered medications for this encounter.     REVIEW OF SYSTEMS: On review of systems, the patient is doing pretty well. She's had ongoing  chronic cough as long as I've known her but no new reported symptoms. She's concerned about her husband's health at this point as well. No other complaints are verbalized about her own health.  PHYSICAL EXAM:    Unable to assess due to encounter type.     ECOG = 1  0 - Asymptomatic (Fully active, able to carry on all predisease activities without restriction)  1 - Symptomatic but completely ambulatory (Restricted in physically strenuous activity but ambulatory and able to carry out work of a light or sedentary nature. For example, light housework, office work)  2 - Symptomatic, <50% in bed during the day (Ambulatory and capable of all self care but unable to carry out any work activities. Up and about more than 50% of waking hours)  3 - Symptomatic, >50% in bed, but not bedbound (Capable of only limited self-care, confined to bed or chair 50% or more of waking hours)  4 - Bedbound (Completely disabled. Cannot carry on any self-care. Totally confined to bed or chair)  5 - Death   Eustace Pen MM, Creech RH, Tormey DC, et al. [phone]). "Toxicity and response criteria of the [facility]". [place] Oncol. 5 (6): 649-55    LABORATORY DATA:  Lab Results  Component Value Date   WBC 6.4 08/09/2021   HGB 13.9 08/09/2021   HCT 42.0 08/09/2021   MCV 95.2 08/09/2021   PLT 124.0 (L) 08/09/2021   Lab Results  Component Value Date   NA 138 08/09/2021   K 4.3 08/09/2021   CL 102 08/09/2021   CO2 29 08/09/2021   Lab Results  Component Value Date   ALT 23 08/09/2021   AST 37 08/09/2021  ALKPHOS 90 08/09/2021   BILITOT 0.7 08/09/2021      RADIOGRAPHY: CT CHEST W CONTRAST  Result Date: 01/14/2022 CLINICAL DATA:  88 year old female with history of non-small cell lung cancer status post SB RT. Follow-up study. * Tracking Code: BO * EXAM: CT CHEST WITH CONTRAST TECHNIQUE: Multidetector CT imaging of the chest was performed during intravenous contrast administration.  RADIATION DOSE REDUCTION: This exam was performed according to the departmental dose-optimization program which includes automated exposure control, adjustment of the mA and/or kV according to patient size and/or use of iterative reconstruction technique. CONTRAST:  23m OMNIPAQUE IOHEXOL 300 MG/ML  SOLN COMPARISON:  Chest CT 07/16/2021. FINDINGS: Cardiovascular: Heart size is mildly enlarged with biatrial dilatation. There is no significant pericardial fluid, thickening or pericardial calcification. Atherosclerotic calcifications in the thoracic aorta. No definite coronary artery calcifications. Status post median sternotomy for aortic valve replacement (a stented bioprosthesis is noted). Mediastinum/Nodes: No pathologically enlarged mediastinal or hilar lymph nodes. Esophagus is unremarkable in appearance. No axillary lymphadenopathy. Lungs/Pleura: Fiducial markers are again noted in the right upper lobe, surrounded by mass-like areas of architectural distortion which appears similar to prior examinations, most compatible with areas of chronic postradiation mass-like fibrosis. No other definite suspicious appearing pulmonary nodules or masses are noted. No acute consolidative airspace disease. No pleural effusions. Upper Abdomen: Subcentimeter low-attenuation lesion in the periphery of segment 8 of the liver, too small to characterize, but similar to the prior study and statistically likely a small cysts (no imaging follow-up is recommended). Chronic pneumatosis again noted in the region of the splenic flexure of the colon, similar to prior examinations. Musculoskeletal: There are no aggressive appearing lytic or blastic lesions noted in the visualized portions of the skeleton. Median sternotomy wires. IMPRESSION: 1. Stable examination demonstrating chronic postradiation mass-like fibrosis in the right upper lobe with no definitive findings to suggest locally recurrent disease or definite metastatic disease in the  thorax. 2. Cardiomegaly with biatrial dilatation. 3. Aortic atherosclerosis. 4. Chronic pneumatosis in the splenic flexure of the colon, stable compared to prior examinations, presumably benign but of uncertain etiology. Aortic Atherosclerosis (ICD10-I70.0). Electronically Signed   By: DVinnie LangtonM.D.   On: 01/14/2022 07:29        IMPRESSION/PLAN: 1. Stage IA2, cT1bN0M0, NSCLC, adenocarcinoma of the RUL. The patient appears to be stable and we will continue surveillance per NCCN Guidelines. We will continue with surveillance and CT chest 6 months. She is in agreement with this plan. 2.  Pneumatosis of the splenic flexure. This will be followed expectantly but she understands the need to proceed with evaluation if she developed acute symptoms of pain, fever, or abrupt GI symptoms.  3. Biatrial enlargement on CT. I will copy Dr. CBurt Knack her cardiologist but we will defer any further management if this poses a clinical concern to him. I suspect this would be followed given her history of atrial fibrillation, aortic stenosis, CAD, and Diastolic CHF.    This encounter was conducted via telephone.  The patient has provided two factor identification and has given verbal consent for this type of encounter and has been advised to only accept a meeting of this type in a secure network environment. The time spent during this encounter was 35 minutes including preparation, discussion, and coordination of the patient's care. The attendants for this meeting include  AHayden Pedro and SClint Guyand WReubin MilanDuring the encounter,  [facility]as [facility].  Abrea L Giddings was located at home.     The above documentation reflects my direct findings during this shared patient visit. Please see the separate note by Dr. Lisbeth Renshaw on this date for the remainder of the patient's plan of care.    Carola Rhine, PAC

## 2022-01-26 ENCOUNTER — Encounter: Payer: Self-pay | Admitting: Internal Medicine

## 2022-01-28 ENCOUNTER — Other Ambulatory Visit (HOSPITAL_BASED_OUTPATIENT_CLINIC_OR_DEPARTMENT_OTHER): Payer: Self-pay

## 2022-01-28 MED ORDER — FLUAD QUADRIVALENT 0.5 ML IM PRSY
PREFILLED_SYRINGE | INTRAMUSCULAR | 0 refills | Status: DC
  Filled 2022-01-28: qty 0.5, 1d supply, fill #0

## 2022-01-28 NOTE — Telephone Encounter (Signed)
Pt and wife declines a sooner appt from 10/20 for the [facility] FU as he is currently scheduled for 6 mo fu.   I will change the visit type to a HOS FU but leave date and time the same.

## 2022-02-07 ENCOUNTER — Encounter: Payer: Self-pay | Admitting: Internal Medicine

## 2022-02-17 ENCOUNTER — Encounter: Payer: Self-pay | Admitting: Internal Medicine

## 2022-02-17 NOTE — Progress Notes (Signed)
Subjective:    Patient ID: [name], female    DOB: 1932-12-15, 88 y.o.   MRN: [id number]     HPI [name] is here for follow up of her chronic medical problems, including htn, afib, CAD, hld, prediabetes, anxiety, insomnia, h/o PUD, B12 def  Not walking as much.  She has been very busy.   Her Afib comes and goes - she can feel it when she is walking.  She has ot noticed it lately.    Medications and allergies reviewed with patient and updated if appropriate.  Current Outpatient Medications on File Prior to Visit  Medication Sig Dispense Refill   acetaminophen (TYLENOL) 500 MG tablet Take 500-1,000 mg by mouth every 6 (six) hours as needed for mild pain or moderate pain.      alendronate (FOSAMAX) 70 MG tablet Take 1 tablet (70 mg total) by mouth every 7 (seven) days. Take with a full glass of water on an empty stomach. 12 tablet 3   ALPRAZolam (XANAX) 0.5 MG tablet TAKE 1 TABLET TWICE DAILY AS NEEDED FOR AN ANXIETY, DO NOT TAKE AT NIGHT. 180 tablet 0   apixaban (ELIQUIS) 5 MG TABS tablet Take 1 tablet (5 mg total) by mouth 2 (two) times daily. 180 tablet 2   Azelastine HCl 0.15 % SOLN 2 sprays twice a day     betamethasone dipropionate 0.05 % cream Apply topically.     Calcium Carb-Cholecalciferol 600-400 MG-UNIT TABS Take 1 tablet by mouth daily.     Chloral Hydrate CRYS Take 10 mls by mouth at bedtime as needed 900 g 5   chlorpheniramine (CHLOR-TRIMETON) 4 MG tablet Take 1 tablet (4 mg total) by mouth every 4 (four) hours as needed for allergies. 90 tablet 1   Cholecalciferol (VITAMIN D-3) 1000 UNITS CAPS Take 1,000 Units by mouth daily.      clindamycin (CLEOCIN) 300 MG capsule Per patient taking prior to dental appointment     desonide (DESOWEN) 0.05 % cream      ferrous sulfate 325 (65 FE) MG tablet Take 325 mg by mouth 2 (two) times a week. Every Monday and Wednesday     furosemide (LASIX) 20 MG tablet Take 1 tablet by mouth daily. If greater than 3 pound gain in  24 hours, increase to 2 tablets daily until weight returns to normal 90 tablet 3   HYDROcodone bit-homatropine (HYCODAN) 5-1.5 MG/5ML syrup Take 5 mLs by mouth every 8 (eight) hours as needed for cough. 120 mL 0   ipratropium (ATROVENT) 0.06 % nasal spray Place 2 sprays into both nostrils in the morning and at bedtime.     levocetirizine (XYZAL) 5 MG tablet Take 5 mg by mouth at bedtime as needed for allergies.      LORazepam (ATIVAN) 0.5 MG tablet as needed.     Magnesium Oxide 500 MG CAPS Per patient 500 mg tablets cutting tablet in half taking 250 mg daily     nitroGLYCERIN (NITROSTAT) 0.4 MG SL tablet Place 1 tablet (0.4 mg total) under the tongue every 5 (five) minutes as needed for chest pain (CP or SOB). 25 tablet 3   Ophthalmic Irrigation Solution (OCUSOFT EYE [place] OP) Place 1 application into both eyes at bedtime.     OVER THE COUNTER MEDICATION Apply 1 application topically daily as needed (pain). Hemp Cream     pantoprazole (PROTONIX) 40 MG tablet TAKE 1 TABLET BY MOUTH TWICE DAILY. 180 tablet 3   Polyethyl Glycol-Propyl Glycol (  SYSTANE ULTRA) 0.4-0.3 % SOLN Place 1 drop into both eyes daily as needed (dry eyes).     polyethylene glycol (MIRALAX / GLYCOLAX) 17 g packet Take 17 g by mouth daily.     potassium chloride (KLOR-CON) 10 MEQ tablet Take 10 mEq by mouth daily.     rosuvastatin (CRESTOR) 20 MG tablet TAKE 1 TABLET ONCE DAILY. 90 tablet 3   tretinoin (RETIN-A) 0.05 % cream Apply topically.     tretinoin (RETIN-A) 0.1 % cream Apply 1 application. topically 2 (two) times a week.     metoprolol succinate (TOPROL XL) 25 MG 24 hr tablet Take 1 tablet (25 mg total) by mouth daily. (Patient not taking: Reported on 02/18/2022) 90 tablet 3   No current facility-administered medications on file prior to visit.     Review of Systems  Constitutional:  Negative for fever.  Respiratory:  Negative for cough, shortness of breath and wheezing.   Cardiovascular:  Positive for palpitations  (occ with anxiety). Negative for chest pain and leg swelling.  Neurological:  Positive for light-headedness (sometimes). Negative for headaches.       Objective:   Vitals:   02/18/22 1026  BP: 138/68  Pulse: 80  Temp: 97.9 F (36.6 C)  SpO2: 97%   BP Readings from Last 3 Encounters:  02/18/22 138/68  10/09/21 (!) 146/101  09/30/21 130/80   Wt Readings from Last 3 Encounters:  02/18/22 160 lb (72.6 kg)  10/09/21 165 lb (74.8 kg)  09/30/21 160 lb 12.8 oz (72.9 kg)   Body mass index is 26.63 kg/m.    Physical Exam Constitutional:      General: She is not in acute distress.    Appearance: Normal appearance.  HENT:     Head: Normocephalic and atraumatic.  Eyes:     Conjunctiva/sclera: Conjunctivae normal.  Cardiovascular:     Rate and Rhythm: Normal rate. Rhythm irregular.     Heart sounds: Normal heart sounds. No murmur heard. Pulmonary:     Effort: Pulmonary effort is normal. No respiratory distress.     Breath sounds: Normal breath sounds. No wheezing.  Musculoskeletal:     Cervical back: Neck supple.     Right lower leg: No edema.     Left lower leg: No edema.  Lymphadenopathy:     Cervical: No cervical adenopathy.  Skin:    General: Skin is warm and dry.     Findings: No rash.  Neurological:     Mental Status: She is alert. Mental status is at baseline.  Psychiatric:        Mood and Affect: Mood normal.        Behavior: Behavior normal.   Consistent with angina     Lab Results  Component Value Date   WBC 6.4 08/09/2021   HGB 13.9 08/09/2021   HCT 42.0 08/09/2021   PLT 124.0 (L) 08/09/2021   GLUCOSE 82 08/09/2021   CHOL 199 08/09/2021   TRIG 81.0 08/09/2021   HDL 100.40 08/09/2021   LDLDIRECT 144.0 11/29/2012   LDLCALC 82 08/09/2021   ALT 23 08/09/2021   AST 37 08/09/2021   NA 138 08/09/2021   K 4.3 08/09/2021   CL 102 08/09/2021   CREATININE 0.80 01/13/2022   BUN 20 08/09/2021   CO2 29 08/09/2021   TSH 2.370 01/17/2021   INR 1.0  03/06/2020   HGBA1C 6.1 08/09/2021     Assessment & Plan:    See Problem List for Assessment and Plan of chronic  medical problems.

## 2022-02-17 NOTE — Patient Instructions (Addendum)
      Blood work was ordered.   The lab is on the first floor.    Medications changes include :   none      Return in about 6 months (around 08/20/2022) for Physical Exam.

## 2022-02-18 ENCOUNTER — Ambulatory Visit (INDEPENDENT_AMBULATORY_CARE_PROVIDER_SITE_OTHER): Payer: PPO | Admitting: Internal Medicine

## 2022-02-18 VITALS — BP 138/68 | HR 80 | Temp 97.9°F | Ht 65.0 in | Wt 160.0 lb

## 2022-02-18 DIAGNOSIS — E782 Mixed hyperlipidemia: Secondary | ICD-10-CM | POA: Diagnosis not present

## 2022-02-18 DIAGNOSIS — I4821 Permanent atrial fibrillation: Secondary | ICD-10-CM | POA: Diagnosis not present

## 2022-02-18 DIAGNOSIS — Z8711 Personal history of peptic ulcer disease: Secondary | ICD-10-CM

## 2022-02-18 DIAGNOSIS — R053 Chronic cough: Secondary | ICD-10-CM

## 2022-02-18 DIAGNOSIS — I2510 Atherosclerotic heart disease of native coronary artery without angina pectoris: Secondary | ICD-10-CM | POA: Diagnosis not present

## 2022-02-18 DIAGNOSIS — F5101 Primary insomnia: Secondary | ICD-10-CM

## 2022-02-18 DIAGNOSIS — R7303 Prediabetes: Secondary | ICD-10-CM

## 2022-02-18 DIAGNOSIS — E538 Deficiency of other specified B group vitamins: Secondary | ICD-10-CM

## 2022-02-18 DIAGNOSIS — I10 Essential (primary) hypertension: Secondary | ICD-10-CM

## 2022-02-18 DIAGNOSIS — F419 Anxiety disorder, unspecified: Secondary | ICD-10-CM

## 2022-02-18 DIAGNOSIS — M810 Age-related osteoporosis without current pathological fracture: Secondary | ICD-10-CM | POA: Diagnosis not present

## 2022-02-18 LAB — COMPREHENSIVE METABOLIC PANEL
ALT: 25 U/L (ref 0–35)
AST: 31 U/L (ref 0–37)
Albumin: 4.4 g/dL (ref 3.5–5.2)
Alkaline Phosphatase: 78 U/L (ref 39–117)
BUN: 18 mg/dL (ref 6–23)
CO2: 30 mEq/L (ref 19–32)
Calcium: 9.2 mg/dL (ref 8.4–10.5)
Chloride: 103 mEq/L (ref 96–112)
Creatinine, Ser: 0.89 mg/dL (ref 0.40–1.20)
GFR: 58.46 mL/min — ABNORMAL LOW (ref >60.00)
Glucose, Bld: 78 mg/dL (ref 70–99)
Potassium: 3.9 mEq/L (ref 3.5–5.1)
Sodium: 140 mEq/L (ref 135–145)
Total Bilirubin: 0.8 mg/dL (ref 0.2–1.2)
Total Protein: 6.6 g/dL (ref 6.0–8.3)

## 2022-02-18 LAB — CBC WITH DIFFERENTIAL/PLATELET
Basophils Absolute: 0.1 10*3/uL (ref 0.0–0.1)
Basophils Relative: 1 % (ref 0.0–3.0)
Eosinophils Absolute: 0.1 10*3/uL (ref 0.0–0.7)
Eosinophils Relative: 1 % (ref 0.0–5.0)
HCT: 42.8 % (ref 36.0–46.0)
Hemoglobin: 14.1 g/dL (ref 12.0–15.0)
Lymphocytes Relative: 38 % (ref 12.0–46.0)
Lymphs Abs: 2.3 10*3/uL (ref 0.7–4.0)
MCHC: 32.8 g/dL (ref 30.0–36.0)
MCV: 98.2 fl (ref 78.0–100.0)
Monocytes Absolute: 0.6 10*3/uL (ref 0.1–1.0)
Monocytes Relative: 10 % (ref 3.0–12.0)
Neutro Abs: 3 10*3/uL (ref 1.4–7.7)
Neutrophils Relative %: 50 % (ref 43.0–77.0)
Platelets: 128 10*3/uL — ABNORMAL LOW (ref 150.0–400.0)
RBC: 4.36 Mil/uL (ref 3.87–5.11)
RDW: 13.9 % (ref 11.5–15.5)
WBC: 6 10*3/uL (ref 4.0–10.5)

## 2022-02-18 LAB — LIPID PANEL
Cholesterol: 189 mg/dL (ref 0–200)
HDL: 93.4 mg/dL (ref >39.00)
LDL Cholesterol: 82 mg/dL (ref 0–99)
NonHDL: 95.64
Total CHOL/HDL Ratio: 2
Triglycerides: 67 mg/dL (ref 0.0–149.0)
VLDL: 13.4 mg/dL (ref 0.0–40.0)

## 2022-02-18 LAB — VITAMIN D 25 HYDROXY (VIT D DEFICIENCY, FRACTURES): VITD: 45.12 ng/mL (ref 30.00–100.00)

## 2022-02-18 LAB — HEMOGLOBIN A1C: Hgb A1c MFr Bld: 6.1 % (ref 4.6–6.5)

## 2022-02-18 NOTE — Assessment & Plan Note (Signed)
Chronic GERD controlled Continue pantoprazole 40 mg twice daily

## 2022-02-18 NOTE — Assessment & Plan Note (Signed)
Chronic Controlled, Stable Continue chloral hydrate 10 mL nightly

## 2022-02-18 NOTE — Assessment & Plan Note (Signed)
Chronic Follow-up with cardiology On Eliquis 5 mg twice daily CBC, CMP

## 2022-02-18 NOTE — Assessment & Plan Note (Signed)
Chronic Regular exercise and healthy diet encouraged Check lipid panel  Continue Crestor 20 mg daily 

## 2022-02-18 NOTE — Assessment & Plan Note (Signed)
Chronic DEXA up-to-date Continue Fosamax 70 mg weekly-started 08/2021 Stressed regular exercise Continue calcium and vitamin D supplementation Check vitamin D level

## 2022-02-18 NOTE — Assessment & Plan Note (Signed)
Chronic Check a1c Low sugar / carb diet Stressed regular exercise  

## 2022-02-18 NOTE — Assessment & Plan Note (Signed)
Chronic Controlled, Stable Continue alprazolam 0.5 mg twice daily as needed

## 2022-02-18 NOTE — Assessment & Plan Note (Signed)
Chronic Following with cardiology No symptoms consistent with angina Continue current medications

## 2022-02-18 NOTE — Assessment & Plan Note (Signed)
Chronic Blood pressure well controlled CMP Continue-currently only on furosemide 20 mg daily-no longer on metoprolol

## 2022-02-18 NOTE — Assessment & Plan Note (Signed)
Chronic Continue B12 supplementation 

## 2022-02-18 NOTE — Assessment & Plan Note (Signed)
Chronic Has intermittent, chronic cough Continue Hycodan cough syrup 5 mils every 8 hours as needed-use medication appropriately

## 2022-02-20 ENCOUNTER — Encounter: Payer: Self-pay | Admitting: Internal Medicine

## 2022-02-22 MED ORDER — ROSUVASTATIN CALCIUM 40 MG PO TABS: 40.0000 mg | ORAL_TABLET | Freq: Every day | ORAL | 3 refills | Status: DC

## 2022-02-28 ENCOUNTER — Encounter: Payer: Self-pay | Admitting: Internal Medicine

## 2022-03-06 ENCOUNTER — Other Ambulatory Visit: Payer: Self-pay | Admitting: Cardiovascular Disease

## 2022-03-15 ENCOUNTER — Other Ambulatory Visit: Payer: Self-pay | Admitting: Internal Medicine

## 2022-04-02 ENCOUNTER — Telehealth: Payer: Self-pay | Admitting: Internal Medicine

## 2022-04-02 NOTE — Telephone Encounter (Signed)
Patient wants to thank you for calling the [facility].

## 2022-04-13 ENCOUNTER — Encounter: Payer: Self-pay | Admitting: Internal Medicine

## 2022-04-14 ENCOUNTER — Emergency Department (HOSPITAL_BASED_OUTPATIENT_CLINIC_OR_DEPARTMENT_OTHER): Payer: PPO | Admitting: Radiology

## 2022-04-14 ENCOUNTER — Ambulatory Visit: Payer: PPO | Admitting: Cardiovascular Disease

## 2022-04-14 ENCOUNTER — Encounter (HOSPITAL_BASED_OUTPATIENT_CLINIC_OR_DEPARTMENT_OTHER): Payer: Self-pay

## 2022-04-14 ENCOUNTER — Other Ambulatory Visit: Payer: Self-pay

## 2022-04-14 ENCOUNTER — Other Ambulatory Visit: Payer: Self-pay | Admitting: Internal Medicine

## 2022-04-14 ENCOUNTER — Emergency Department (HOSPITAL_BASED_OUTPATIENT_CLINIC_OR_DEPARTMENT_OTHER)
Admission: EM | Admit: 2022-04-14 | Discharge: 2022-04-14 | Disposition: A | Discharge status: Home / Self Care | Payer: PPO | Attending: Emergency Medicine | Admitting: Emergency Medicine

## 2022-04-14 DIAGNOSIS — Z20822 Contact with and (suspected) exposure to covid-19: Secondary | ICD-10-CM | POA: Diagnosis not present

## 2022-04-14 DIAGNOSIS — I499 Cardiac arrhythmia, unspecified: Secondary | ICD-10-CM | POA: Diagnosis not present

## 2022-04-14 DIAGNOSIS — J101 Influenza due to other identified influenza virus with other respiratory manifestations: Secondary | ICD-10-CM | POA: Diagnosis not present

## 2022-04-14 DIAGNOSIS — Z853 Personal history of malignant neoplasm of breast: Secondary | ICD-10-CM | POA: Diagnosis not present

## 2022-04-14 DIAGNOSIS — Z7901 Long term (current) use of anticoagulants: Secondary | ICD-10-CM | POA: Diagnosis not present

## 2022-04-14 DIAGNOSIS — R059 Cough, unspecified: Secondary | ICD-10-CM | POA: Diagnosis present

## 2022-04-14 LAB — RESP PANEL BY RT-PCR (RSV, FLU A&B, COVID)  RVPGX2
Influenza A by PCR: POSITIVE — AB
Influenza B by PCR: NEGATIVE
Resp Syncytial Virus by PCR: NEGATIVE
SARS Coronavirus 2 by RT PCR: NEGATIVE

## 2022-04-14 MED ORDER — HYDROCODONE BIT-HOMATROP MBR 5-1.5 MG/5ML PO SOLN: 5.0000 mL | Freq: Three times a day (TID) | ORAL | 0 refills | Status: DC | PRN

## 2022-04-14 NOTE — ED Provider Notes (Signed)
[facility] EMERGENC[facility] DEPT Provider Note   CSN: [id number] Arrival date & time: 04/14/22  0900     History  Chief Complaint  Patient presents with   Cough    [name] is a 88 y.o. female.  HPI 88 year old female presents with fever.  She has been feeling ill since 12/15.  She has had fever, cough, congestion.  No vomiting or chest pain.  She has a history of A-fib and is on Eliquis.  She also has a history of breast cancer status post radiation.  States the only thing that helps the cough is Hycodan at night but she has run out.  She is asking for some more currently.  Denies dyspnea.  Home Medications Prior to Admission medications   Medication Sig Start Date End Date Taking? Authorizing Provider  HYDROcodone bit-homatropine (HYCODAN) 5-1.5 MG/5ML syrup Take 5 mLs by mouth every 8 (eight) hours as needed for cough. 04/14/22  Yes Sherwood Gambler, MD  acetaminophen (TYLENOL) 500 MG tablet Take 500-1,000 mg by mouth every 6 (six) hours as needed for mild pain or moderate pain.     [provider]  alendronate (FOSAMAX) 70 MG tablet Take 1 tablet (70 mg total) by mouth every 7 (seven) days. Take with a full glass of water on an empty stomach. 09/06/21   Burns, Claudina Lick, MD  ALPRAZolam (XANAX) 0.5 MG tablet TAKE 1 TABLET TWICE DAILY AS NEEDED FOR AN ANXIETY, DO NOT TAKE AT NIGHT. 03/17/22   [name], MD  apixaban (ELIQUIS) 5 MG TABS tablet Take 1 tablet (5 mg total) by mouth 2 (two) times daily. 08/28/21   [name], MD  Azelastine HCl 0.15 % SOLN 2 sprays twice a day 08/13/21   [provider]  betamethasone dipropionate 0.05 % cream Apply topically. 06/04/21   [provider]  Calcium Carb-Cholecalciferol 600-400 MG-UNIT TABS Take 1 tablet by mouth daily.    [provider]  Chloral Hydrate CRYS Take 10 mls by mouth at bedtime as needed 12/31/21   [name], MD  chlorpheniramine (CHLOR-TRIMETON) 4 MG tablet Take 1  tablet (4 mg total) by mouth every 4 (four) hours as needed for allergies. 09/06/21   [name], MD  Cholecalciferol (VITAMIN D-3) 1000 UNITS CAPS Take 1,000 Units by mouth daily.     [provider]  clindamycin (CLEOCIN) 300 MG capsule Per patient taking prior to dental appointment 07/10/21   [provider]  desonide (DESOWEN) 0.05 % cream  06/04/21   [provider]  ferrous sulfate 325 (65 FE) MG tablet Take 325 mg by mouth 2 (two) times a week. Every Monday and Wednesday    [provider]  furosemide (LASIX) 20 MG tablet Take 1 tablet by mouth daily. If greater than 3 pound gain in 24 hours, increase to 2 tablets daily until weight returns to normal 09/30/21   [name], MD  HYDROcodone bit-homatropine [facility]) 5-1.5 MG/5ML syrup Take 5 mLs by mouth every 8 (eight) hours as needed for cough. 06/07/21   [name], MD  ipratropium (ATROVENT) 0.06 % nasal spray Place 2 sprays into both nostrils in the morning and at bedtime. 08/09/20   [provider]  levocetirizine (XYZAL) 5 MG tablet Take 5 mg by mouth at bedtime as needed for allergies.  10/11/18   [provider]  Magnesium Oxide 500 MG CAPS Per patient 500 mg tablets cutting tablet in half taking 250 mg daily 06/04/21  [provider]  nitroGLYCERIN (NITROSTAT) 0.4 MG SL tablet Place 1 tablet (0.4 mg total) under the tongue every 5 (five) minutes as needed for chest pain (CP or SOB). 02/05/19   Duke, Tami Lin, PA  Ophthalmic Irrigation Solution (OCUSOFT EYE [place] OP) Place 1 application into both eyes at bedtime.    [provider]  OVER THE COUNTER MEDICATION Apply 1 application topically daily as needed (pain). Hemp Cream    [provider]  pantoprazole (PROTONIX) 40 MG tablet TAKE 1 TABLET BY MOUTH TWICE DAILY. 11/05/21   Mansouraty, Telford Nab., MD  Polyethyl Glycol-Propyl Glycol (SYSTANE ULTRA) 0.4-0.3 % SOLN Place 1 drop into both eyes daily as  needed (dry eyes).    [provider]  polyethylene glycol (MIRALAX / GLYCOLAX) 17 g packet Take 17 g by mouth daily.    [provider]  potassium chloride (KLOR-CON) 10 MEQ tablet Take 1 tablet (10 mEq total) by mouth daily. 03/07/22   [name], MD  rosuvastatin (CRESTOR) 40 MG tablet Take 1 tablet (40 mg total) by mouth daily. 02/22/22   [name], MD  tretinoin (RETIN-A) 0.05 % cream Apply topically. 06/04/21   [provider]  tretinoin (RETIN-A) 0.1 % cream Apply 1 application. topically 2 (two) times a week. 01/18/09   [provider]      Allergies    Lactose intolerance (gi), Amoxicillin-pot clavulanate, Penicillins, Sulfa antibiotics, and Sulfonamide derivatives    Review of Systems   Review of Systems  Constitutional:  Positive for fever.  HENT:  Positive for congestion.   Respiratory:  Positive for cough. Negative for shortness of breath.   Cardiovascular:  Negative for chest pain.  Gastrointestinal:  Negative for vomiting.    Physical Exam Updated Vital Signs BP (!) 143/97 (BP Location: Right Arm)   Pulse 100   Temp 98.3 F (36.8 C) (Oral)   Resp 19   Ht 5\' 5"  (1.651 m)   Wt 72.1 kg   SpO2 94%   BMI 26.46 kg/m  Physical Exam Vitals and nursing note reviewed.  Constitutional:      General: She is not in acute distress.    Appearance: She is well-developed. She is not ill-appearing or diaphoretic.  HENT:     Head: Normocephalic and atraumatic.  Cardiovascular:     Rate and Rhythm: Normal rate. Rhythm irregular.     Heart sounds: Normal heart sounds.  Pulmonary:     Effort: Pulmonary effort is normal.     Breath sounds: Rhonchi present.  Abdominal:     General: There is no distension.  Skin:    General: Skin is warm and dry.  Neurological:     Mental Status: She is alert.     ED Results / Procedures / Treatments   Labs (all labs ordered are listed, but only abnormal results are displayed) Labs Reviewed   RESP PANEL BY RT-PCR (RSV, FLU A&B, COVID)  [id number] - Abnormal; Notable for the following components:      Result Value   Influenza A by PCR POSITIVE (*)    All other components within normal limits    EKG None  Radiology DG Chest 2 View  Result Date: 04/14/2022 CLINICAL DATA:  Cough, congestion, fever and chills. EXAM: CHEST - 2 VIEW COMPARISON:  Radiographs 03/30/2021 and 03/02/2021.  CT 01/13/2022. FINDINGS: Stable mild cardiomegaly post median sternotomy and aortic valve replacement. Chronic radiation changes in the right perihilar region are stable. There is no confluent airspace  opacity, edema, pleural effusion or pneumothorax. Degenerative changes are present throughout the spine. IMPRESSION: No evidence of acute cardiopulmonary process. Chronic radiation changes in the right perihilar region. Electronically Signed   By: Richardean Sale M.D.   On: 04/14/2022 10:42    Procedures Procedures    Medications Ordered in ED Medications - No data to display  ED Course/ Medical Decision Making/ A&P                           Medical Decision Making Amount and/or Complexity of Data Reviewed External Data Reviewed: notes. Labs:     Details: Flu A positive Radiology: ordered and independent interpretation performed.    Details: No pneumonia  Risk Prescription drug management.   Patient's flu test is positive.  X-ray images viewed/interpreted by myself and there is no pneumonia but chronic scarring.  Discussed treatment options with patient and how antibiotics will not be helpful as this is a viral process.  Unfortunately she is over 48 hours as far as for Tamiflu and given this she declines Tamiflu which I think is reasonable.  Otherwise, we will give a course of Hycodan to help with her cough.  Will also have her follow-up with PCP.  She is not hypoxic or in distress.  Will discharge home with return precautions.        Final Clinical Impression(s) / ED Diagnoses Final  diagnoses:  Influenza A    Rx / DC Orders ED Discharge Orders          Ordered    HYDROcodone bit-homatropine (HYCODAN) 5-1.5 MG/5ML syrup  Every 8 hours PRN        04/14/22 1205              Sherwood Gambler, MD 04/14/22 1215

## 2022-04-14 NOTE — ED Triage Notes (Signed)
Pt states having a cough and congestion. States having fever and chills with congestion. Has been taking tylenol for fever and aches.

## 2022-04-14 NOTE — Discharge Instructions (Addendum)
Your Flu test is positive today. Continue to take over the counter medicines such as Tylenol, flonase, saline sprays, etc. You are being prescribed a cough medicine as well.   If you develop severe headache, neck pain/stiffness, vomiting, trouble breathing, or any other new/concerning symptoms then return to the ER or call 911.

## 2022-04-16 ENCOUNTER — Encounter: Payer: Self-pay | Admitting: Cardiovascular Disease

## 2022-04-16 NOTE — Telephone Encounter (Signed)
Called and spoke with patient. Scheduled for 04/18/22 @4pm . States she has enough Furosemide to last until then.

## 2022-04-17 ENCOUNTER — Telehealth: Payer: Self-pay

## 2022-04-17 NOTE — Telephone Encounter (Signed)
Transition Care Management Unsuccessful Follow-up Telephone Call  Date of discharge and from where:  April 14, 2022 [facility] ED  Attempts:  1st Attempt  Reason for unsuccessful TCM follow-up call:  Left voice message

## 2022-04-17 NOTE — Telephone Encounter (Signed)
If patient returns call to clinic please schedule her for a ED follow up appointment with any available provider next week.

## 2022-04-18 ENCOUNTER — Ambulatory Visit: Payer: PPO | Attending: Cardiovascular Disease | Admitting: Cardiovascular Disease

## 2022-04-18 ENCOUNTER — Encounter: Payer: Self-pay | Admitting: Cardiovascular Disease

## 2022-04-18 VITALS — BP 138/82 | HR 109 | Ht 65.0 in | Wt 160.4 lb

## 2022-04-18 DIAGNOSIS — I4821 Permanent atrial fibrillation: Secondary | ICD-10-CM

## 2022-04-18 DIAGNOSIS — T8203XD Leakage of heart valve prosthesis, subsequent encounter: Secondary | ICD-10-CM | POA: Diagnosis not present

## 2022-04-18 DIAGNOSIS — I5032 Chronic diastolic (congestive) heart failure: Secondary | ICD-10-CM | POA: Diagnosis not present

## 2022-04-18 DIAGNOSIS — I2510 Atherosclerotic heart disease of native coronary artery without angina pectoris: Secondary | ICD-10-CM | POA: Diagnosis not present

## 2022-04-18 MED ORDER — METOPROLOL SUCCINATE ER 25 MG PO TB24: 25.0000 mg | ORAL_TABLET | Freq: Every day | ORAL | 3 refills | Status: DC

## 2022-04-18 NOTE — Patient Instructions (Signed)
Medication Instructions:  START Metoprolol Succinate 25mg  daily *If you need a refill on your cardiac medications before your next appointment, please call your pharmacy*   Lab Work: NONE If you have labs (blood work) drawn today and your tests are completely normal, you will receive your results only by: [place] (if you have MyChart) OR A paper copy in the mail If you have any lab test that is abnormal or we need to change your treatment, we will call you to review the results.   Testing/Procedures: ECHO (prior to 69m appt) Your physician has requested that you have an echocardiogram. Echocardiography is a painless test that uses sound waves to create images of your heart. It provides your doctor with information about the size and shape of your heart and how well your heart's chambers and valves are working. This procedure takes approximately one hour. There are no restrictions for this procedure. Please do NOT wear cologne, perfume, aftershave, or lotions (deodorant is allowed). Please arrive 15 minutes prior to your appointment time.  Nurse visit in 1 month w/EKG to verify HR control  Follow-Up: At  [facility], you and your health needs are our priority.  As part of our continuing mission to provide you with exceptional heart care, we have created designated Provider Care Teams.  These Care Teams include your primary Cardiologist (physician) and Advanced Practice Providers (APPs -  Physician Assistants and Nurse Practitioners) who all work together to provide you with the care you need, when you need it.  Your next appointment:   6 month(s)  The format for your next appointment:   In Person  Provider:   [name], MD       Important Information About Sugar

## 2022-04-18 NOTE — Progress Notes (Unsigned)
Cardiology Office Note:    Date:  04/21/2022   ID:  [name], DOB 18-Aug-1932, MRN [id number]  PCP:  [name], MD   [place] Prov[facility] Cardiologist:  [name], MD     Referring MD: [name], MD   Chief Complaint  Patient presents with   Shortness of Breath   History of Present Illness:    [name] is a 88 y.o. female with a hx of: Persistent atrial fibrillation  S/p TEE-DCCV 11/2019 CHA2DS2-VASc=6 (age x 2, female, CAD, CHF, HTN) >> Apixaban   Failed cardioversion with amiodarone in 2022, now with persistent atrial fibrillation Bicuspid valve, aortic stenosis S/p bioprosthetic AVR in 2016 Paravalvular leak Normal coronary arteries prior to AVR Coronary artery disease S/p NSTEMI in 01/2019 >> total occlusion of distal LAD felt to be prob embolic event Ischemic CM, EF 45-50 w ant-apical AK TEE 8/21: EF 55 Breast CA Hypertension  Hyperlipidemia  Diastolic CHF   The patient is here alone today.  She had flulike symptoms in the past week.  She also noticed weight gain and had more trouble with her breathing.  She increased furosemide with doubling of her dose and lost 3-1/2 pounds overnight.  She has felt much better since then.  States breathing is back to baseline.  No leg edema, orthopnea, or PND.  No chest pain or pressure.  Still having a cough related to her recent viral syndrome.  No other complaints at this time.  Past Medical History:  Diagnosis Date   Allergy    Anemia    Anxiety    Aortic valve stenosis, severe    s/p AVR // mild to mod paravalvular leakage // Echo 9/21: EF 55-60, no RWMA, mild LVH, GR 2 DD, mildly reduced RVSF, RVSP 38.7, moderate LAE, mild RAE, AVR with mean gradient 11 mmHg, mild paravalvular leakage   Arthritis    Breast cancer ([place]) 2007   right   Cataract    Constipation    Dysrhythmia    A-fib   Hiatal hernia    pt denies   Hypercholesteremia    Hypertension    Hypoglycemia     Osteopenia    Personal history of radiation therapy    Tingling    Ulcer 2012   bleeding gastric Ulcer    Past Surgical History:  Procedure Laterality Date   AORTIC VALVE REPLACEMENT N/A 11/16/2014   Procedure: AORTIC VALVE REPLACEMENT (AVR);  Surgeon: Gaye Pollack, MD;  Location: [place];  Service: Open Heart Surgery;  Laterality: N/A;   BIOPSY  12/15/2017   Procedure: BIOPSY;  Surgeon: Rush Landmark Telford Nab., MD;  Location: [place];  Service: Gastroenterology;;   BIOPSY  01/10/2021   Procedure: BIOPSY;  Surgeon: Irving Copas., MD;  Location: [place];  Service: Gastroenterology;;   BREAST BIOPSY     BREAST LUMPECTOMY Right    2005?   BREAST SURGERY     BRONCHIAL BIOPSY  03/06/2020   Procedure: BRONCHIAL BIOPSIES;  Surgeon: Collene Gobble, MD;  Location: [facility] ENDOSCOPY;  Service: Pulmonary;;   BRONCHIAL BRUSHINGS  03/06/2020   Procedure: BRONCHIAL BRUSHINGS;  Surgeon: Collene Gobble, MD;  Location: [facility] ENDOSCOPY;  Service: Pulmonary;;   BRONCHIAL NEEDLE ASPIRATION BIOPSY  03/06/2020   Procedure: BRONCHIAL NEEDLE ASPIRATION BIOPSIES;  Surgeon: Collene Gobble, MD;  Location: [facility] ENDOSCOPY;  Service: Pulmonary;;   BRONCHIAL WASHINGS  03/06/2020   Procedure: BRONCHIAL WASHINGS;  Surgeon: Collene Gobble, MD;  Location:  [place] ENDOSCOPY;  Service: Pulmonary;;   CARDIAC CATHETERIZATION N/A 10/18/2014   Procedure: Right/Left Heart Cath and Coronary Angiography;  Surgeon: [name], MD;  Location: [place] CV LAB;  Service: Cardiovascular;  Laterality: N/A;   CARDIOVERSION N/A 12/09/2019   Procedure: CARDIOVERSION;  Surgeon: Josue Hector, MD;  Location: [place];  Service: Cardiovascular;  Laterality: N/A;   CARDIOVERSION N/A 12/30/2019   Procedure: CARDIOVERSION;  Surgeon: Josue Hector, MD;  Location: [facility] ENDOSCOPY;  Service: Cardiovascular;  Laterality: N/A;   COLONOSCOPY WITH PROPOFOL N/A 01/10/2021   Procedure: COLONOSCOPY WITH PROPOFOL;  Surgeon: Irving Copas., MD;  Location: [place];  Service: Gastroenterology;  Laterality: N/A;   CORONARY ANGIOGRAPHY N/A 02/04/2019   Procedure: CORONARY ANGIOGRAPHY;  Surgeon: Nelva Bush, MD;  Location: [place] CV LAB;  Service: Cardiovascular;  Laterality: N/A;   COSMETIC SURGERY     face   ESOPHAGOGASTRODUODENOSCOPY (EGD) WITH PROPOFOL N/A 12/15/2017   Procedure: ESOPHAGOGASTRODUODENOSCOPY (EGD) WITH PROPOFOL;  Surgeon: Rush Landmark Telford Nab., MD;  Location: [place];  Service: Gastroenterology;  Laterality: N/A;   FIDUCIAL MARKER PLACEMENT  03/06/2020   Procedure: FIDUCIAL MARKER PLACEMENT;  Surgeon: Collene Gobble, MD;  Location: [facility] ENDOSCOPY;  Service: Pulmonary;;   PARATHYROIDECTOMY     TEE WITHOUT CARDIOVERSION N/A 11/16/2014   Procedure: TRANSESOPHAGEAL ECHOCARDIOGRAM (TEE);  Surgeon: Gaye Pollack, MD;  Location: [place];  Service: Open Heart Surgery;  Laterality: N/A;   TEE WITHOUT CARDIOVERSION N/A 12/09/2019   Procedure: TRANSESOPHAGEAL ECHOCARDIOGRAM (TEE);  Surgeon: Josue Hector, MD;  Location: [facility] ENDOSCOPY;  Service: Cardiovascular;  Laterality: N/A;   TONSILLECTOMY     VIDEO BRONCHOSCOPY WITH ENDOBRONCHIAL NAVIGATION Right 03/06/2020   Procedure: VIDEO BRONCHOSCOPY WITH ENDOBRONCHIAL NAVIGATION;  Surgeon: Collene Gobble, MD;  Location: [facility] ENDOSCOPY;  Service: Pulmonary;  Laterality: Right;    Current Medications: Current Meds  Medication Sig   acetaminophen (TYLENOL) 500 MG tablet Take 500-1,000 mg by mouth every 6 (six) hours as needed for mild pain or moderate pain.    alendronate (FOSAMAX) 70 MG tablet Take 1 tablet (70 mg total) by mouth every 7 (seven) days. Take with a full glass of water on an empty stomach.   ALPRAZolam (XANAX) 0.5 MG tablet TAKE 1 TABLET TWICE DAILY AS NEEDED FOR AN ANXIETY, DO NOT TAKE AT NIGHT.   apixaban (ELIQUIS) 5 MG TABS tablet Take 1 tablet (5 mg total) by mouth 2 (two) times daily.   Azelastine HCl 0.15 % SOLN 2 sprays twice a day    Calcium Carb-Cholecalciferol 600-400 MG-UNIT TABS Take 1 tablet by mouth daily.   Chloral Hydrate CRYS Take 10 mls by mouth at bedtime as needed   chlorpheniramine (CHLOR-TRIMETON) 4 MG tablet Take 1 tablet (4 mg total) by mouth every 4 (four) hours as needed for allergies.   Cholecalciferol (VITAMIN D-3) 1000 UNITS CAPS Take 1,000 Units by mouth daily.    clindamycin (CLEOCIN) 300 MG capsule Per patient taking prior to dental appointment   desonide (DESOWEN) 0.05 % cream    ferrous sulfate 325 (65 FE) MG tablet Take 325 mg by mouth 2 (two) times a week. Every Monday and Wednesday   furosemide (LASIX) 20 MG tablet Take 1 tablet by mouth daily. If greater than 3 pound gain in 24 hours, increase to 2 tablets daily until weight returns to normal   HYDROcodone bit-homatropine (HYCODAN) 5-1.5 MG/5ML syrup Take 5 mLs by mouth every 8 (eight) hours as needed for cough.   ipratropium (  ATROVENT) 0.06 % nasal spray Place 2 sprays into both nostrils in the morning and at bedtime.   levocetirizine (XYZAL) 5 MG tablet Take 5 mg by mouth at bedtime as needed for allergies.    Magnesium Oxide 500 MG CAPS Per patient 500 mg tablets cutting tablet in half taking 250 mg daily   metoprolol succinate (TOPROL XL) 25 MG 24 hr tablet Take 1 tablet (25 mg total) by mouth daily.   nitroGLYCERIN (NITROSTAT) 0.4 MG SL tablet Place 1 tablet (0.4 mg total) under the tongue every 5 (five) minutes as needed for chest pain (CP or SOB).   Ophthalmic Irrigation Solution (OCUSOFT EYE [place] OP) Place 1 application into both eyes at bedtime.   OVER THE COUNTER MEDICATION Apply 1 application topically daily as needed (pain). Hemp Cream   pantoprazole (PROTONIX) 40 MG tablet TAKE 1 TABLET BY MOUTH TWICE DAILY.   Polyethyl Glycol-Propyl Glycol (SYSTANE ULTRA) 0.4-0.3 % SOLN Place 1 drop into both eyes daily as needed (dry eyes).   polyethylene glycol (MIRALAX / GLYCOLAX) 17 g packet Take 17 g by mouth daily.   potassium chloride  (KLOR-CON) 10 MEQ tablet Take 1 tablet (10 mEq total) by mouth daily.   rosuvastatin (CRESTOR) 40 MG tablet Take 1 tablet (40 mg total) by mouth daily.   tretinoin (RETIN-A) 0.05 % cream Apply topically.   [DISCONTINUED] HYDROcodone bit-homatropine (HYCODAN) 5-1.5 MG/5ML syrup take 74mls BY MOUTH EVERY 8 HOURS AS NEEDED FOR COUGH     Allergies:   Lactose intolerance (gi), Amoxicillin-pot clavulanate, Penicillins, Sulfa antibiotics, and Sulfonamide derivatives   Social History   Socioeconomic History   Marital status: Married    Spouse name: Not on file   Number of children: 3   Years of education: 16   Highest education level: Bachelor's degree (e.g., BA, AB, BS)  Occupational History   Occupation: Retired  Tobacco Use   Smoking status: Former    Packs/day: 0.50    Years: 15.00    Total pack years: 7.50    Types: Cigarettes    Start date: 8    Quit date: 1986    Years since quitting: 38.0   Smokeless tobacco: Never   Tobacco comments:    quit smoking 1980  Vaping Use   Vaping Use: Never used  Substance and Sexual Activity   Alcohol use: Yes    Alcohol/week: 7.0 standard drinks of alcohol    Types: 7 Glasses of wine per week    Comment: wine nightly with dinner   Drug use: No   Sexual activity: Not Currently  Other Topics Concern   Not on file  Social History Narrative   ** Merged History Encounter **       Lives at [facility] with her husband. Right-handed. Caffeine use: 3-4 cups per day (some tea, mixes coffee with decaf).   Social Determinants of Health   Financial Resource Strain: Low Risk  (11/06/2021)   Overall Financial Resource Strain (CARDIA)    Difficulty of Paying Living Expenses: Not hard at all  Food Insecurity: No Food Insecurity (11/06/2021)   Hunger Vital Sign    Worried About Running Out of Food in the Last Year: Never true    Ran Out of Food in the Last Year: Never true  Transportation Needs: No Transportation Needs (11/06/2021)   PRAPARE -  Hydrologist (Medical): No    Lack of Transportation (Non-Medical): No  Physical Activity: Sufficiently Active (11/23/2018)   Exercise  Vital Sign    Days of Exercise per Week: 6 days    Minutes of Exercise per Session: 30 min  Stress: No Stress Concern Present (11/06/2021)   [place]    Feeling of Stress : Not at all  Social Connections: Center (11/06/2021)   Social Connection and Isolation Panel [NHANES]    Frequency of Communication with Friends and Family: More than three times a week    Frequency of Social Gatherings with Friends and Family: More than three times a week    Attends Religious Services: 1 to 4 times per year    Active Member of [employer] or Organizations: Yes    Attends Music therapist: Not on file    Marital Status: Married     Family History: The patient's family history includes Cancer (age of onset: 66) in her cousin; Colon cancer in her mother; Hypertension in her mother; Kidney disease in her father; Rectal cancer in her mother. There is no history of Esophageal cancer, Stomach cancer, or Colon polyps.  ROS:   Please see the history of present illness.    All other systems reviewed and are negative.  EKGs/Labs/Other Studies Reviewed:    The following studies were reviewed today: Echo 06/07/2021: 1. In Afib wtih RVR during study. Left ventricular ejection fraction, by  estimation, is 60 to 65%      The left ventricle has normal function. The left ventricle has no  regional wall motion abnormalities. There is moderate asymmetric left  ventricular hypertrophy of the basal-septal segment. Left ventricular  diastolic parameters are indeterminate.   2. Right ventricular systolic function is mildly reduced. The right  ventricular size is mildly enlarged. There is moderately elevated  pulmonary artery systolic pressure. The estimated right  ventricular  systolic pressure is 50.9 mmHg.   3. Right atrial size was moderately dilated.   4. The mitral valve is normal in structure. Mild mitral valve  regurgitation. No evidence of mitral stenosis.   5. Tricuspid valve regurgitation is mild to moderate.   6. There is a 21 mm Edwards Magna-Ease valve present in the aortic  position.     Paravalvular leak is not well visualized, but appears mild to  moderate. MG 14 mmHg, stable from prior echo. Vmax 2.5 m/s, MG 14 mmHg,  EOA 0.8 cm^2, DI 0.31   7. The inferior vena cava is dilated in size with >50% respiratory  variability, suggesting right atrial pressure of 8 mmHg.   EKG:  EKG is ordered today.  The ekg ordered today demonstrates atrial fibrillation with ventricular rate 109 bpm, nonspecific ST and T wave abnormality.  Recent Labs: 02/18/2022: ALT 25; BUN 18; Creatinine, Ser 0.89; Hemoglobin 14.1; Platelets 128.0; Potassium 3.9; Sodium 140  Recent Lipid Panel    Component Value Date/Time   CHOL 189 02/18/2022 1108   TRIG 67.0 02/18/2022 1108   HDL 93.40 02/18/2022 1108   CHOLHDL 2 02/18/2022 1108   VLDL 13.4 02/18/2022 1108   LDLCALC 82 02/18/2022 1108   LDLDIRECT 144.0 11/29/2012 1601     Risk Assessment/Calculations:    CHA2DS2-VASc Score = 6   This indicates a 9.7% annual risk of stroke. The patient's score is based upon: CHF History: 1 HTN History: 1 Diabetes History: 0 Stroke History: 0 Vascular Disease History: 1 Age Score: 2 Gender Score: 1           Physical Exam:    VS:  BP 138/82 (  BP Location: Left Arm, Patient Position: Sitting, Cuff Size: Normal)   Pulse (!) 109   Ht 5\' 5"  (1.651 m)   Wt 160 lb 6.4 oz (72.8 kg)   SpO2 96%   BMI 26.69 kg/m     Wt Readings from Last 3 Encounters:  04/18/22 160 lb 6.4 oz (72.8 kg)  04/14/22 159 lb (72.1 kg)  02/18/22 160 lb (72.6 kg)     GEN:  Well nourished, well developed in no acute distress HEENT: Normal NECK: No JVD; No carotid bruits LYMPHATICS:  No lymphadenopathy CARDIAC: Irregularly irregular, 2/6 diastolic decrescendo murmur at the left lower sternal border RESPIRATORY:  Clear to auscultation without rales, wheezing or rhonchi  ABDOMEN: Soft, non-tender, non-distended MUSCULOSKELETAL:  No edema; No deformity  SKIN: Warm and dry NEUROLOGIC:  Alert and oriented x 3 PSYCHIATRIC:  Normal affect   ASSESSMENT:    1. Permanent atrial fibrillation ([place])   2. Paravalvular leak of prosthetic heart valve, subsequent encounter   3. Chronic diastolic heart failure ([place])   4. Coronary artery disease involving native coronary artery of native heart without angina pectoris    PLAN:    In order of problems listed above:  The patient's heart rate is elevated.  She has recently had flu symptoms and this may be partly related to that.  However, she has been off of amiodarone now since last year and was supposed to be taking metoprolol succinate but this is not on her medicine list.  I have recommended metoprolol succinate 25 mg daily.  Continue apixaban for oral anticoagulation.  Will bring her back in for a nurse visit in about a month to make sure her heart rate is better controlled. Appears stable.  Repeat echo prior to her next office visit in 6 months. Recent exacerbation, now improved after increasing furosemide.  May impart be due to the stress of her viral illness or to poorly controlled atrial fibrillation.  She will remain on furosemide 20 mg daily.  She can increase to 40 mg as she has done based on a sliding scale with her weight. No anginal symptoms on her current medical program.  Continue the same.           Medication Adjustments/Labs and Tests Ordered: Current medicines are reviewed at length with the patient today.  Concerns regarding medicines are outlined above.  Orders Placed This Encounter  Procedures   ECHOCARDIOGRAM COMPLETE   Meds ordered this encounter  Medications   metoprolol succinate (TOPROL XL) 25 MG 24 hr  tablet    Sig: Take 1 tablet (25 mg total) by mouth daily.    Dispense:  90 tablet    Refill:  3    Pt will pick up tomorrow    Patient Instructions  Medication Instructions:  START Metoprolol Succinate 25mg  daily *If you need a refill on your cardiac medications before your next appointment, please call your pharmacy*   Lab Work: NONE If you have labs (blood work) drawn today and your tests are completely normal, you will receive your results only by: [place] (if you have MyChart) OR A paper copy in the mail If you have any lab test that is abnormal or we need to change your treatment, we will call you to review the results.   Testing/Procedures: ECHO (prior to 28m appt) Your physician has requested that you have an echocardiogram. Echocardiography is a painless test that uses sound waves to create images of your heart. It provides your doctor with  information about the size and shape of your heart and how well your heart's chambers and valves are working. This procedure takes approximately one hour. There are no restrictions for this procedure. Please do NOT wear cologne, perfume, aftershave, or lotions (deodorant is allowed). Please arrive 15 minutes prior to your appointment time.  Nurse visit in 1 month w/EKG to verify HR control  Follow-Up: At [facility], you and your health needs are our priority.  As part of our continuing mission to provide you with exceptional heart care, we have created designated Provider Care Teams.  These Care Teams include your primary Cardiologist (physician) and Advanced Practice Providers (APPs -  Physician Assistants and Nurse Practitioners) who all work together to provide you with the care you need, when you need it.  Your next appointment:   6 month(s)  The format for your next appointment:   In Person  Provider:   [name], MD       Important Information About Sugar         Signed, [name], MD   04/21/2022 5:43 PM    [place]

## 2022-04-21 ENCOUNTER — Encounter: Payer: Self-pay | Admitting: Cardiovascular Disease

## 2022-04-22 NOTE — Addendum Note (Signed)
Addended by: Janan Halter F on: 04/22/2022 12:52 PM   Modules accepted: Orders

## 2022-04-28 NOTE — Progress Notes (Unsigned)
Subjective:    Patient ID: [name], female    DOB: 1932/05/29, 89 y.o.   MRN: [id number]     HPI [name] is here for follow up from the ED   ED for cough 12/18 - tested positive for the flu.  Covid, rsv negative.  Cxr negative.  Here for follow-up.  She admits the flu was very severe.  I did call in some cough syrup for her and she said that really helped, but the cough is nonstop.  Her symptoms have improved significantly and she denies any residual symptoms at this time.  She is bothered by chronic allergies-she has been year-round.  Several over-the-counter medications that she has tried in the past has not helped.  She is wondering if there is anything that would help.  She has tried over-the-counter antihistamines, nasal sprays and Singulair in the past.  SOB, persistent Afib - had weight gain and increased SOB with flu.  Increased lasix and lost 3/5 lbs overnight.  SOB back to baseline. She was restarted on metoprolol.  To continue lasix 20 mg daily - will take 40 mg if  needed.  She states now she is doing well on her current medications denies shortness of breath and has no leg swelling.  She is weighing herself daily.   Medications and allergies reviewed with patient and updated if appropriate.  Current Outpatient Medications on File Prior to Visit  Medication Sig Dispense Refill   acetaminophen (TYLENOL) 500 MG tablet Take 500-1,000 mg by mouth every 6 (six) hours as needed for mild pain or moderate pain.      alendronate (FOSAMAX) 70 MG tablet Take 1 tablet (70 mg total) by mouth every 7 (seven) days. Take with a full glass of water on an empty stomach. 12 tablet 3   ALPRAZolam (XANAX) 0.5 MG tablet TAKE 1 TABLET TWICE DAILY AS NEEDED FOR AN ANXIETY, DO NOT TAKE AT NIGHT. 180 tablet 0   apixaban (ELIQUIS) 5 MG TABS tablet Take 1 tablet (5 mg total) by mouth 2 (two) times daily. 180 tablet 2   Azelastine HCl 0.15 % SOLN 2 sprays twice a day     betamethasone  dipropionate 0.05 % cream Apply topically.     Calcium Carb-Cholecalciferol 600-400 MG-UNIT TABS Take 1 tablet by mouth daily.     Chloral Hydrate CRYS Take 10 mls by mouth at bedtime as needed 900 g 5   chlorpheniramine (CHLOR-TRIMETON) 4 MG tablet Take 1 tablet (4 mg total) by mouth every 4 (four) hours as needed for allergies. 90 tablet 1   Cholecalciferol (VITAMIN D-3) 1000 UNITS CAPS Take 1,000 Units by mouth daily.      clindamycin (CLEOCIN) 300 MG capsule Per patient taking prior to dental appointment     desonide (DESOWEN) 0.05 % cream      ferrous sulfate 325 (65 FE) MG tablet Take 325 mg by mouth 2 (two) times a week. Every Monday and Wednesday     furosemide (LASIX) 20 MG tablet Take 1 tablet by mouth daily. If greater than 3 pound gain in 24 hours, increase to 2 tablets daily until weight returns to normal 90 tablet 3   HYDROcodone bit-homatropine (HYCODAN) 5-1.5 MG/5ML syrup Take 5 mLs by mouth every 8 (eight) hours as needed for cough. 120 mL 0   ipratropium (ATROVENT) 0.06 % nasal spray Place 2 sprays into both nostrils in the morning and at bedtime.     levocetirizine (XYZAL) 5 MG  tablet Take 5 mg by mouth at bedtime as needed for allergies.      Magnesium Oxide 250 MG TABS Take by mouth.     metoprolol succinate (TOPROL XL) 25 MG 24 hr tablet Take 1 tablet (25 mg total) by mouth daily. 90 tablet 3   nitroGLYCERIN (NITROSTAT) 0.4 MG SL tablet Place 1 tablet (0.4 mg total) under the tongue every 5 (five) minutes as needed for chest pain (CP or SOB). 25 tablet 3   Ophthalmic Irrigation Solution (OCUSOFT EYE [place] OP) Place 1 application into both eyes at bedtime.     OVER THE COUNTER MEDICATION Apply 1 application topically daily as needed (pain). Hemp Cream     pantoprazole (PROTONIX) 40 MG tablet TAKE 1 TABLET BY MOUTH TWICE DAILY. 180 tablet 3   Polyethyl Glycol-Propyl Glycol (SYSTANE ULTRA) 0.4-0.3 % SOLN Place 1 drop into both eyes daily as needed (dry eyes).     polyethylene  glycol (MIRALAX / GLYCOLAX) 17 g packet Take 17 g by mouth daily.     potassium chloride (KLOR-CON) 10 MEQ tablet Take 1 tablet (10 mEq total) by mouth daily. 90 tablet 2   rosuvastatin (CRESTOR) 40 MG tablet Take 1 tablet (40 mg total) by mouth daily. 90 tablet 3   tretinoin (RETIN-A) 0.05 % cream Apply topically.     No current facility-administered medications on file prior to visit.     Review of Systems  Constitutional:  Negative for appetite change, chills, fatigue and fever.  HENT:  Negative for congestion, ear pain, postnasal drip, sinus pain and sore throat.   Respiratory:  Negative for cough, shortness of breath and wheezing.   Cardiovascular:  Negative for leg swelling.  Neurological:  Negative for light-headedness and headaches.       Objective:   Vitals:   04/30/22 1044  BP: 126/78  Pulse: 60  Temp: 98.1 F (36.7 C)  SpO2: 96%   BP Readings from Last 3 Encounters:  04/30/22 126/78  04/18/22 138/82  04/14/22 (!) 143/97   Wt Readings from Last 3 Encounters:  04/30/22 155 lb (70.3 kg)  04/18/22 160 lb 6.4 oz (72.8 kg)  04/14/22 159 lb (72.1 kg)   Body mass index is 25.79 kg/m.    Physical Exam Constitutional:      General: She is not in acute distress.    Appearance: Normal appearance.  HENT:     Head: Normocephalic and atraumatic.  Eyes:     Conjunctiva/sclera: Conjunctivae normal.  Cardiovascular:     Rate and Rhythm: Normal rate and regular rhythm.     Heart sounds: Murmur (2/6) heard.  Pulmonary:     Effort: Pulmonary effort is normal. No respiratory distress.     Breath sounds: Normal breath sounds. No wheezing.  Musculoskeletal:     Cervical back: Neck supple.     Right lower leg: No edema.     Left lower leg: No edema.  Lymphadenopathy:     Cervical: No cervical adenopathy.  Skin:    General: Skin is warm and dry.     Findings: No rash.  Neurological:     Mental Status: She is alert. Mental status is at baseline.  Psychiatric:         Mood and Affect: Mood normal.        Behavior: Behavior normal.        Lab Results  Component Value Date   WBC 6.0 02/18/2022   HGB 14.1 02/18/2022   HCT 42.8 02/18/2022  PLT 128.0 (L) 02/18/2022   GLUCOSE 78 02/18/2022   CHOL 189 02/18/2022   TRIG 67.0 02/18/2022   HDL 93.40 02/18/2022   LDLDIRECT 144.0 11/29/2012   LDLCALC 82 02/18/2022   ALT 25 02/18/2022   AST 31 02/18/2022   NA 140 02/18/2022   K 3.9 02/18/2022   CL 103 02/18/2022   CREATININE 0.89 02/18/2022   BUN 18 02/18/2022   CO2 30 02/18/2022   TSH 2.370 01/17/2021   INR 1.0 03/06/2020   HGBA1C 6.1 02/18/2022   DG Chest 2 View CLINICAL DATA:  Cough, congestion, fever and chills.  EXAM: CHEST - 2 VIEW  COMPARISON:  Radiographs 03/30/2021 and 03/02/2021.  CT 01/13/2022.  FINDINGS: Stable mild cardiomegaly post median sternotomy and aortic valve replacement. Chronic radiation changes in the right perihilar region are stable. There is no confluent airspace opacity, edema, pleural effusion or pneumothorax. Degenerative changes are present throughout the spine.  IMPRESSION: No evidence of acute cardiopulmonary process. Chronic radiation changes in the right perihilar region.  Electronically Signed   By: Richardean Sale M.D.   On: 04/14/2022 10:42    Assessment & Plan:    See Problem List for Assessment and Plan of chronic medical problems.

## 2022-04-30 ENCOUNTER — Ambulatory Visit (INDEPENDENT_AMBULATORY_CARE_PROVIDER_SITE_OTHER): Payer: PPO | Admitting: Internal Medicine

## 2022-04-30 ENCOUNTER — Encounter: Payer: Self-pay | Admitting: Internal Medicine

## 2022-04-30 ENCOUNTER — Other Ambulatory Visit (HOSPITAL_BASED_OUTPATIENT_CLINIC_OR_DEPARTMENT_OTHER): Payer: Self-pay

## 2022-04-30 VITALS — BP 126/78 | HR 60 | Temp 98.1°F | Ht 65.0 in | Wt 155.0 lb

## 2022-04-30 DIAGNOSIS — J3089 Other allergic rhinitis: Secondary | ICD-10-CM | POA: Diagnosis not present

## 2022-04-30 DIAGNOSIS — I5032 Chronic diastolic (congestive) heart failure: Secondary | ICD-10-CM

## 2022-04-30 DIAGNOSIS — I10 Essential (primary) hypertension: Secondary | ICD-10-CM | POA: Diagnosis not present

## 2022-04-30 DIAGNOSIS — I4821 Permanent atrial fibrillation: Secondary | ICD-10-CM | POA: Diagnosis not present

## 2022-04-30 DIAGNOSIS — I509 Heart failure, unspecified: Secondary | ICD-10-CM | POA: Insufficient documentation

## 2022-04-30 DIAGNOSIS — J101 Influenza due to other identified influenza virus with other respiratory manifestations: Secondary | ICD-10-CM

## 2022-04-30 MED ORDER — AREXVY 120 MCG/0.5ML IM SUSR
INTRAMUSCULAR | 0 refills | Status: DC
  Filled 2022-04-30: qty 0.5, 1d supply, fill #0

## 2022-04-30 NOTE — Patient Instructions (Addendum)
         Medications changes include :   none    A referral was ordered for [facility] - Dr Neldon Mc.     Someone will call you to schedule an appointment.

## 2022-04-30 NOTE — Assessment & Plan Note (Signed)
Chronic Blood pressure is well-controlled On metoprolol XL 25 mg daily

## 2022-04-30 NOTE — Assessment & Plan Note (Addendum)
Year round Otc allergy medications help minimally, has tried Singulair in the past time and did not feel that that helped Has tried several nasal sprays Would like to see a new allergist to see if there is anything else that may help-referral ordered Continue chlorphentermine 4 mg every 4 hours as needed and Astelin nasal spray

## 2022-04-30 NOTE — Assessment & Plan Note (Signed)
Recent episode of influenza A-here for follow-up Has completely recovered and all symptoms have resolved No further treatment

## 2022-04-30 NOTE — Assessment & Plan Note (Signed)
Chronic Euvolemic Following with cardiology Weighing herself daily and weight has been stable On Lasix 20 mg daily, metoprolol XL 25 mg daily Doing well-continue above

## 2022-04-30 NOTE — Assessment & Plan Note (Signed)
Chronic Following with cardiology Doing better since on metoprolol XL 25 mg daily again On Eliquis Euvolemic

## 2022-05-05 ENCOUNTER — Other Ambulatory Visit: Payer: Self-pay

## 2022-05-05 DIAGNOSIS — I4821 Permanent atrial fibrillation: Secondary | ICD-10-CM

## 2022-05-05 MED ORDER — APIXABAN 5 MG PO TABS: 5.0000 mg | ORAL_TABLET | Freq: Two times a day (BID) | ORAL | 2 refills | Status: DC

## 2022-05-05 NOTE — Telephone Encounter (Signed)
Prescription refill request for Eliquis received. Indication:afib Last office visit:12/23 Scr:0.8 Age: 89 Weight:70.3  kg  Prescription refilled

## 2022-05-06 ENCOUNTER — Other Ambulatory Visit: Payer: Self-pay | Admitting: Internal Medicine

## 2022-05-06 DIAGNOSIS — Z1231 Encounter for screening mammogram for malignant neoplasm of breast: Secondary | ICD-10-CM

## 2022-05-13 ENCOUNTER — Encounter: Payer: Self-pay | Admitting: Internal Medicine

## 2022-05-14 MED ORDER — HYDROCODONE BIT-HOMATROP MBR 5-1.5 MG/5ML PO SOLN: 5.0000 mL | Freq: Three times a day (TID) | ORAL | 0 refills | Status: DC | PRN

## 2022-05-15 ENCOUNTER — Ambulatory Visit: Payer: PPO | Attending: Cardiology | Admitting: Cardiology

## 2022-05-15 VITALS — BP 112/78 | HR 113 | Ht 65.0 in | Wt 158.4 lb

## 2022-05-15 DIAGNOSIS — I4819 Other persistent atrial fibrillation: Secondary | ICD-10-CM

## 2022-05-15 NOTE — Progress Notes (Signed)
   Nurse Visit   Date of Encounter: 05/15/2022 ID: [name], DOB 1932/10/17, MRN [id number]  PCP:  [name], MD   [facility] Cardiologist:  [name], MD {    Visit Details   VS:  BP 112/78 (BP Location: Left Arm, Patient Position: Sitting)   Pulse (!) 113   Ht 5\' 5"  (1.651 m)   Wt 158 lb 6.4 oz (71.8 kg)   SpO2 96%   BMI 26.36 kg/m  , BMI Body mass index is 26.36 kg/m.  Wt Readings from Last 3 Encounters:  05/15/22 158 lb 6.4 oz (71.8 kg)  04/30/22 155 lb (70.3 kg)  04/18/22 160 lb 6.4 oz (72.8 kg)     Reason for visit: Follow up EKG for Toprol XL 25mg  start Performed today:   , Vitals, EKG, Provider consulted:[place], and Education Changes (medications, testing, etc.) : none Length of Visit: 25 minutes 5.   Reviewed by Dr Johney Frame, no new orders received.   Medications Adjustments/Labs and Tests Ordered: No orders of the defined types were placed in this encounter.    Signed, Freada Bergeron, MD  05/15/2022 12:55 PM

## 2022-05-15 NOTE — Patient Instructions (Signed)
Medication Instructions:  Your physician recommends that you continue on your current medications as directed. Please refer to the Current Medication list given to you today.  *If you need a refill on your cardiac medications before your next appointment, please call your pharmacy   Follow-Up: At [facility], you and your health needs are our priority.  As part of our continuing mission to provide you with exceptional heart care, we have created designated Provider Care Teams.  These Care Teams include your primary Cardiologist (physician) and Advanced Practice Providers (APPs -  Physician Assistants and Nurse Practitioners) who all work together to provide you with the care you need, when you need it.  Your next appointment:   As scheduled

## 2022-05-16 NOTE — Addendum Note (Signed)
Addended by: Dennis Bast F on: 05/16/2022 10:09 AM   Modules accepted: Orders

## 2022-05-27 ENCOUNTER — Other Ambulatory Visit: Payer: Self-pay

## 2022-05-27 ENCOUNTER — Telehealth: Payer: Self-pay | Admitting: Allergy and Immunology

## 2022-05-27 ENCOUNTER — Ambulatory Visit (INDEPENDENT_AMBULATORY_CARE_PROVIDER_SITE_OTHER): Payer: PPO | Admitting: Allergy and Immunology

## 2022-05-27 VITALS — BP 112/80 | HR 100 | Temp 97.3°F | Resp 16 | Ht 65.0 in | Wt 159.5 lb

## 2022-05-27 DIAGNOSIS — J310 Chronic rhinitis: Secondary | ICD-10-CM

## 2022-05-27 DIAGNOSIS — J3089 Other allergic rhinitis: Secondary | ICD-10-CM | POA: Diagnosis not present

## 2022-05-27 DIAGNOSIS — E739 Lactose intolerance, unspecified: Secondary | ICD-10-CM

## 2022-05-27 MED ORDER — LEVOCETIRIZINE DIHYDROCHLORIDE 5 MG PO TABS: 5.0000 mg | ORAL_TABLET | Freq: Every evening | ORAL | 1 refills | Status: DC

## 2022-05-27 MED ORDER — RYALTRIS 665-25 MCG/ACT NA SUSP: NASAL | 5 refills | Status: DC

## 2022-05-27 MED ORDER — IPRATROPIUM BROMIDE 0.06 % NA SOLN: NASAL | 1 refills | Status: DC

## 2022-05-27 NOTE — Patient Instructions (Addendum)
  1. START Ryaltris - 2 sprays each nostril 1-2 times per day (specialty pharmacy)  2. IF NEEDED:   A. Ipratropium 0.06% - 2 sprays each nostril every 6 hours to dry nose  B. Levocetirizine 5 mg - 1 tablet 1 time per day  3. Return to clinic in 4 weeks  4. Cado ice cream (Chocolate, Mint Chocolate chip) is lactose free.

## 2022-05-27 NOTE — Progress Notes (Unsigned)
[place] - [place] - [place] - [place] - [place]   Dear Dr. Quay Burow,  Thank you for referring [name] to the [facility] on 05/27/2022.   Below is a summation of this patient's evaluation and recommendations.  Thank you for your referral. I will keep you informed about this patient's response to treatment.   If you have any questions please do not hesitate to contact me.   Sincerely,  Jiles Prows, MD Allergy / Immunology [place]   ______________________________________________________________________    NEW PATIENT NOTE  Referring Provider: [name], MD Primary Provider: [name], MD Date of office visit: 05/27/2022    Subjective:   Chief Complaint:  [name] (DOB: 03/21/1933) is a 89 y.o. female who presents to the clinic on 05/27/2022 with a chief complaint of Allergic Rhinitis  (Says she is allergic to grasses and trees. Was tested about fifty years ago. Rejected immunotherapy at that time. ) .     HPI: [name] presents to this clinic in evaluation of nasal issues.  She has a long history of allergic rhinitis and has been under the care of a local allergist but her symptoms changed significantly over the course of the past several seasons.  She now has drippy nose all the time.  She still has some sneezing and nasal congestion.  This occurs even though she has been consistently using Flonase.  She was recently tried on montelukast and Claritin and Allegra which did not help this issue.  She does think that the use of levo cetirizine may help this issue somewhat.  She has no associated anosmia or headaches or other respiratory tract symptoms.  Apparently she has been deemed to be allergic to multiple allergens including trees and grasses and weeds and dust mite.  She is also lactose intolerant and has difficulty eating ice cream that is  located at her living center and she likes ice cream.  She has received a flu vaccine, RSV vaccine, and COVID-vaccine.  Past Medical History:  Diagnosis Date   Allergy    Anemia    Anxiety    Aortic valve stenosis, severe    s/p AVR // mild to mod paravalvular leakage // Echo 9/21: EF 55-60, no RWMA, mild LVH, GR 2 DD, mildly reduced RVSF, RVSP 38.7, moderate LAE, mild RAE, AVR with mean gradient 11 mmHg, mild paravalvular leakage   Arthritis    Breast cancer ([place]) 2007   right   Cataract    Constipation    Dysrhythmia    A-fib   Hiatal hernia    pt denies   Hypercholesteremia    Hypertension    Hypoglycemia    Osteopenia    Personal history of radiation therapy    Tingling    Ulcer 2012   bleeding gastric Ulcer    Past Surgical History:  Procedure Laterality Date   AORTIC VALVE REPLACEMENT N/A 11/16/2014   Procedure: AORTIC VALVE REPLACEMENT (AVR);  Surgeon: Gaye Pollack, MD;  Location: [facility];  Service: Open Heart Surgery;  Laterality: N/A;   BIOPSY  12/15/2017   Procedure: BIOPSY;  Surgeon: Rush Landmark Telford Nab., MD;  Location: [facility];  Service: Gastroenterology;;   BIOPSY  01/10/2021   Procedure: BIOPSY;  Surgeon: Irving Copas., MD;  Location: [facility];  Service: Gastroenterology;;   BREAST BIOPSY     BREAST LUMPECTOMY Right    2005?  BREAST SURGERY     BRONCHIAL BIOPSY  03/06/2020   Procedure: BRONCHIAL BIOPSIES;  Surgeon: Collene Gobble, MD;  Location: [facility] ENDOSCOPY;  Service: Pulmonary;;   BRONCHIAL BRUSHINGS  03/06/2020   Procedure: BRONCHIAL BRUSHINGS;  Surgeon: Collene Gobble, MD;  Location: [facility] ENDOSCOPY;  Service: Pulmonary;;   BRONCHIAL NEEDLE ASPIRATION BIOPSY  03/06/2020   Procedure: BRONCHIAL NEEDLE ASPIRATION BIOPSIES;  Surgeon: Collene Gobble, MD;  Location: [facility] ENDOSCOPY;  Service: Pulmonary;;   BRONCHIAL WASHINGS  03/06/2020   Procedure: BRONCHIAL WASHINGS;  Surgeon: Collene Gobble, MD;  Location: [facility] ENDOSCOPY;  Service:  Pulmonary;;   CARDIAC CATHETERIZATION N/A 10/18/2014   Procedure: Right/Left Heart Cath and Coronary Angiography;  Surgeon: [name], MD;  Location: [place] CV LAB;  Service: Cardiovascular;  Laterality: N/A;   CARDIOVERSION N/A 12/09/2019   Procedure: CARDIOVERSION;  Surgeon: Josue Hector, MD;  Location: [facility];  Service: Cardiovascular;  Laterality: N/A;   CARDIOVERSION N/A 12/30/2019   Procedure: CARDIOVERSION;  Surgeon: Josue Hector, MD;  Location: [facility] ENDOSCOPY;  Service: Cardiovascular;  Laterality: N/A;   COLONOSCOPY WITH PROPOFOL N/A 01/10/2021   Procedure: COLONOSCOPY WITH PROPOFOL;  Surgeon: Irving Copas., MD;  Location: [facility];  Service: Gastroenterology;  Laterality: N/A;   CORONARY ANGIOGRAPHY N/A 02/04/2019   Procedure: CORONARY ANGIOGRAPHY;  Surgeon: Nelva Bush, MD;  Location: [place] CV LAB;  Service: Cardiovascular;  Laterality: N/A;   COSMETIC SURGERY     face   ESOPHAGOGASTRODUODENOSCOPY (EGD) WITH PROPOFOL N/A 12/15/2017   Procedure: ESOPHAGOGASTRODUODENOSCOPY (EGD) WITH PROPOFOL;  Surgeon: Rush Landmark Telford Nab., MD;  Location: [place];  Service: Gastroenterology;  Laterality: N/A;   FIDUCIAL MARKER PLACEMENT  03/06/2020   Procedure: FIDUCIAL MARKER PLACEMENT;  Surgeon: Collene Gobble, MD;  Location: [facility] ENDOSCOPY;  Service: Pulmonary;;   PARATHYROIDECTOMY     TEE WITHOUT CARDIOVERSION N/A 11/16/2014   Procedure: TRANSESOPHAGEAL ECHOCARDIOGRAM (TEE);  Surgeon: Gaye Pollack, MD;  Location: [place];  Service: Open Heart Surgery;  Laterality: N/A;   TEE WITHOUT CARDIOVERSION N/A 12/09/2019   Procedure: TRANSESOPHAGEAL ECHOCARDIOGRAM (TEE);  Surgeon: Josue Hector, MD;  Location[facility] END[facility]COPY;  Service: Cardiovascular;  Laterality: N/A;   TONSILLECTOMY     VIDEO BRONCHOSCOPY WITH ENDOBRONCHIAL NAVIGATION Right 03/06/2020   Procedure: VIDEO BRONCHOSCOPY WITH ENDOBRONCHIAL NAVIGATION;  Surgeon: Collene Gobble, MD;  Location: [facility]  ENDOSCOPY;  Service: Pulmonary;  Laterality: Right;    Allergies as of 05/27/2022       Reactions   Lactose Intolerance (gi) Diarrhea, Nausea Only   GI upset   Amoxicillin-pot Clavulanate Hives, Rash   Amoxicillin-pot Clavulanate Rash   Penicillin G Benzathine Rash   Penicillins Hives, Rash   30 years   Sulfa Antibiotics Hives, Rash   Sulfonamide Derivatives Hives, Rash        Medication List    acetaminophen 500 MG tablet Commonly known as: TYLENOL Take 500-1,000 mg by mouth every 6 (six) hours as needed for mild pain or moderate pain.   alendronate 70 MG tablet Commonly known as: FOSAMAX Take 1 tablet (70 mg total) by mouth every 7 (seven) days. Take with a full glass of water on an empty stomach.   ALPRAZolam 0.5 MG tablet Commonly known as: XANAX TAKE 1 TABLET TWICE DAILY AS NEEDED FOR AN ANXIETY, DO NOT TAKE AT NIGHT.   apixaban 5 MG Tabs tablet Commonly known as: ELIQUIS Take 1 tablet (5 mg total) by mouth 2 (two) times daily.   Azelastine HCl  0.15 % Soln 2 sprays twice a day   Calcium Carb-Cholecalciferol 600-400 MG-UNIT Tabs Take 1 tablet by mouth daily.   Chloral Hydrate Crys Take 10 mls by mouth at bedtime as needed   clindamycin 300 MG capsule Commonly known as: CLEOCIN Per patient taking prior to dental appointment   desonide 0.05 % cream Commonly known as: DESOWEN   ferrous sulfate 325 (65 FE) MG tablet Take 325 mg by mouth 2 (two) times a week. Every Monday and Wednesday   furosemide 20 MG tablet Commonly known as: LASIX Take 1 tablet by mouth daily. If greater than 3 pound gain in 24 hours, increase to 2 tablets daily until weight returns to normal   HYDROcodone bit-homatropine 5-1.5 MG/5ML syrup Commonly known as: HYCODAN Take 5 mLs by mouth every 8 (eight) hours as needed for cough.   levocetirizine 5 MG tablet Commonly known as: XYZAL Take 5 mg by mouth at bedtime as needed for allergies.   Magnesium Oxide 250 MG Tabs Take by  mouth.   nitroGLYCERIN 0.4 MG SL tablet Commonly known as: NITROSTAT Place 1 tablet (0.4 mg total) under the tongue every 5 (five) minutes as needed for chest pain (CP or SOB).   pantoprazole 40 MG tablet Commonly known as: PROTONIX TAKE 1 TABLET BY MOUTH TWICE DAILY.   polyethylene glycol 17 g packet Commonly known as: MIRALAX / GLYCOLAX Take 17 g by mouth daily.   potassium chloride 10 MEQ tablet Commonly known as: KLOR-CON Take 1 tablet (10 mEq total) by mouth daily.   rosuvastatin 40 MG tablet Commonly known as: CRESTOR Take 1 tablet (40 mg total) by mouth daily.   Systane Ultra 0.4-0.3 % Soln Generic drug: Polyethyl Glycol-Propyl Glycol Place 1 drop into both eyes daily as needed (dry eyes).   tretinoin 0.05 % cream Commonly known as: RETIN-A Apply topically.   Vitamin D-3 25 MCG (1000 UT) Caps Take 1,000 Units by mouth daily.    Review of systems negative except as noted in HPI / PMHx or noted below:  Review of Systems  Constitutional: Negative.   HENT: Negative.    Eyes: Negative.   Respiratory: Negative.    Cardiovascular: Negative.   Gastrointestinal: Negative.   Genitourinary: Negative.   Musculoskeletal: Negative.   Skin: Negative.   Neurological: Negative.   Endo/Heme/Allergies: Negative.   Psychiatric/Behavioral: Negative.      Family History  Problem Relation Age of Onset   Hypertension Mother    Rectal cancer Mother    Colon cancer Mother    Kidney disease Father    Cancer Cousin 2       breast and ovarian    Esophageal cancer Neg Hx    Stomach cancer Neg Hx    Colon polyps Neg Hx     Social History   Socioeconomic History   Marital status: Married    Spouse name: Not on file   Number of children: 3   Years of education: 16   Highest education level: Bachelor's degree (e.g., BA, AB, BS)  Occupational History   Occupation: Retired  Tobacco Use   Smoking status: Former    Packs/day: 0.50    Years: 15.00    Total pack years:  7.50    Types: Cigarettes    Start date: 64    Quit date: 1986    Years since quitting: 38.1   Smokeless tobacco: Never   Tobacco comments:    quit smoking 1980  Vaping Use   Vaping Use: Never  used  Substance and Sexual Activity   Alcohol use: Yes    Alcohol/week: 7.0 standard drinks of alcohol    Types: 7 Glasses of wine per week    Comment: wine nightly with dinner   Drug use: No   Sexual activity: Not Currently  Other Topics Concern   Not on file  Social History Narrative   ** Merged History Encounter **       Lives at [facility] with her husband. Right-handed. Caffeine use: 3-4 cups per day (some tea, mixes coffee with decaf).   Social Determinants of Health   Financial Resource Strain: Low Risk  (11/06/2021)   Overall Financial Resource Strain (CARDIA)    Difficulty of Paying Living Expenses: Not hard at all  Food Insecurity: No Food Insecurity (11/06/2021)   Hunger Vital Sign    Worried About Running Out of Food in the Last Year: Never true    Ran Out of Food in the Last Year: Never true  Transportation Needs: No Transportation Needs (11/06/2021)   PRAPARE - Hydrologist (Medical): No    Lack of Transportation (Non-Medical): No  Physical Activity: Sufficiently Active (11/23/2018)   Exercise Vital Sign    Days of Exercise per Week: 6 days    Minutes of Exercise per Session: 30 min  Stress: No Stress Concern Present (11/06/2021)   [place]    Feeling of Stress : Not at all  Social Connections: [place] (11/06/2021)   Social Connection and Isolation Panel [NHANES]    Frequency of Communication with Friends and Family: More than three times a week    Frequency of Social Gatherings with Friends and Family: More than three times a week    Attends Religious Services: 1 to 4 times per year    Active Member of [employer] or Organizations: Yes    Attends Theatre manager Meetings: Not on file    Marital Status: Married  Intimate Partner Violence: Not At Risk (11/06/2021)   Humiliation, Afraid, Rape, and Kick questionnaire    Fear of Current or Ex-Partner: No    Emotionally Abused: No    Physically Abused: No    Sexually Abused: No    Environmental and Social history  Lives at friend's home without any animals, carpet in the bedroom, no plastic on the bed, no plastic on the pillow, no smoking ongoing with inside the household.  Objective:   Vitals:   05/27/22 1010  BP: 112/80  Pulse: 100  Resp: 16  Temp: (!) 97.3 F (36.3 C)  SpO2: 94%   Height: 5\' 5"  (165.1 cm) Weight: 159 lb 8 oz (72.3 kg)  Physical Exam Constitutional:      Appearance: She is not diaphoretic.  HENT:     Head: Normocephalic.     Right Ear: External ear normal.     Left Ear: External ear normal.     Ears:     Comments: Hearing aids    Nose: Nose normal. No mucosal edema or rhinorrhea.     Mouth/Throat:     Pharynx: Uvula midline. No oropharyngeal exudate.  Eyes:     Conjunctiva/sclera: Conjunctivae normal.  Neck:     Thyroid: No thyromegaly.     Trachea: Trachea normal. No tracheal tenderness or tracheal deviation.  Cardiovascular:     Rate and Rhythm: Normal rate and regular rhythm.     Heart sounds: Normal heart sounds, S1 normal and S2  normal. No murmur heard. Pulmonary:     Effort: No respiratory distress.     Breath sounds: Normal breath sounds. No stridor. No wheezing or rales.  Lymphadenopathy:     Head:     Right side of head: No tonsillar adenopathy.     Left side of head: No tonsillar adenopathy.     Cervical: No cervical adenopathy.  Skin:    Findings: No erythema or rash.     Nails: There is no clubbing.  Neurological:     Mental Status: She is alert.     Diagnostics: Allergy skin tests were not performed.   Results of blood tests obtained 18 February 2022 identifies WBC 6.0, absolute eosinophil 100, absolute basophil 100,  absolute lymphocyte 2300, hemoglobin 14.1, platelet 128  Results of a chest CT scan obtained 13 January 2022 identified the following:  Lungs/Pleura: Fiducial markers are again noted in the right upper lobe, surrounded by mass-like areas of architectural distortion which appears similar to prior examinations, most compatible with areas of chronic postradiation mass-like fibrosis. No other definite suspicious appearing pulmonary nodules or masses are noted. No acute consolidative airspace disease. No pleural effusions.  Results of a brain MRI obtained 14 October 2017 identified the following:  Sinuses/Orbits: The paranasal sinuses and mastoid air cells are clear. Globes and orbits are within normal limits.  Assessment and Plan:    1. Perennial allergic rhinitis   2. Nonallergic rhinitis   3. Lactose intolerance    1. START Ryaltris - 2 sprays each nostril 1-2 times per day (specialty pharmacy)  2. IF NEEDED:   A. Ipratropium 0.06% - 2 sprays each nostril every 6 hours to dry nose  B. Levocetirizine 5 mg - 1 tablet 1 time per day  3. Return to clinic in 4 weeks  4. Cado ice cream (Chocolate, Mint Chocolate chip) is lactose free.  Lacrisha will use a combination of a nasal steroid, nasal antihistamine, nasal anticholinergic agent and I suspect that she will do well with this plan and we will see her back in this clinic in 4 weeks to assess her response.  As well, I have given her the name of a lactose-free avocado based ice cream that she can consume in the setting of her lactose intolerance.  Jiles Prows, MD Allergy / Immunology [facility] [place]

## 2022-05-27 NOTE — Telephone Encounter (Signed)
Patient is requesting a call back from a nurse regarding her Ryaltris. She is wanting more information on this medication and how to follow through with [facility]. I informed her that [facility] is the pharmacy we send Ryaltris to and they will need confirmation via phone. Patient wants clarification and a phone number to call because the one she was provided is not correct.

## 2022-05-28 ENCOUNTER — Encounter: Payer: Self-pay | Admitting: Allergy and Immunology

## 2022-05-28 NOTE — Telephone Encounter (Signed)
Called and left a voicemail asking for patient to return call to discuss.  °

## 2022-05-30 NOTE — Telephone Encounter (Signed)
I called patient and she said she was on her way out and was unable to talk. Patient said to call her Monday morning around 9am.

## 2022-06-02 NOTE — Telephone Encounter (Signed)
I tried calling patient back but no answer. I left a vm to call the office back

## 2022-06-04 NOTE — Telephone Encounter (Signed)
I called patient to discuss ryaltris but patient said that she did not have any questions at the moment.

## 2022-06-18 ENCOUNTER — Telehealth: Payer: Self-pay | Admitting: *Deleted

## 2022-06-18 NOTE — Telephone Encounter (Signed)
XXXX 

## 2022-06-18 NOTE — Telephone Encounter (Signed)
RETURNED PATIENT'S PHONE CALL, SPOKE WITH PATIENT. ?

## 2022-06-24 ENCOUNTER — Ambulatory Visit
Admission: RE | Admit: 2022-06-24 | Discharge: 2022-06-24 | Disposition: A | Discharge status: Home / Self Care | Payer: PPO | Source: Ambulatory Visit | Attending: Internal Medicine | Admitting: Internal Medicine

## 2022-06-24 ENCOUNTER — Other Ambulatory Visit: Payer: Self-pay

## 2022-06-24 ENCOUNTER — Ambulatory Visit (INDEPENDENT_AMBULATORY_CARE_PROVIDER_SITE_OTHER): Payer: PPO | Admitting: Allergy and Immunology

## 2022-06-24 VITALS — BP 110/60 | HR 90 | Temp 97.7°F | Resp 16 | Ht 64.17 in | Wt 160.4 lb

## 2022-06-24 DIAGNOSIS — J3089 Other allergic rhinitis: Secondary | ICD-10-CM | POA: Diagnosis not present

## 2022-06-24 DIAGNOSIS — Z1231 Encounter for screening mammogram for malignant neoplasm of breast: Secondary | ICD-10-CM

## 2022-06-24 DIAGNOSIS — J310 Chronic rhinitis: Secondary | ICD-10-CM

## 2022-06-24 MED ORDER — IPRATROPIUM BROMIDE 0.06 % NA SOLN: NASAL | 5 refills | Status: DC

## 2022-06-24 MED ORDER — LEVOCETIRIZINE DIHYDROCHLORIDE 5 MG PO TABS: 5.0000 mg | ORAL_TABLET | Freq: Every evening | ORAL | 1 refills | Status: DC

## 2022-06-24 MED ORDER — AZELASTINE-FLUTICASONE 137-50 MCG/ACT NA SUSP: NASAL | 1 refills | Status: DC

## 2022-06-24 NOTE — Patient Instructions (Addendum)
  1. Can use one of the following:  A.  Ryaltris - 2 sprays each nostril 1-2 times per day (specialty pharmacy) B.  Dymista - 1 spray each nostril 1-2 times per day  2. IF NEEDED:   A. Ipratropium 0.06% - 2 sprays each nostril every 6 hours to dry nose  B. Levocetirizine 5 mg - 1 tablet 1 time per day  3. Return to clinic in 12 weeks

## 2022-06-24 NOTE — Progress Notes (Unsigned)
[place] [facility] [place] [facility] [place]   Follow-up Note  Referring Provider: [name], MD Primary Provider: [name], MD Date of Office Visit: 06/24/2022  Subjective:   [name] (DOB: 08/04/1932) is a 89 y.o. female who returns to the [facility] on 06/24/2022 in re-evaluation of the following:  HPI: [name] returns to this clinic in evaluation of allergic and nonallergic rhinitis.  I last saw her in this clinic during her initial evaluation of 27 May 2022.  She has been having some logistical problems regarding acquiring her combination nasal steroid/antihistamine from specialty pharmacy.  She wonders if there is a product that she can obtain at a [facility].  After 2 weeks of use of her combination nasal steroid/antihistamine she did think that she had less stuffiness of her nose.  In addition, she does think that the nasal ipratropium does dry up her nose somewhat.  Allergies as of 06/24/2022       Reactions   Lactose Intolerance (gi) Diarrhea, Nausea Only   GI upset   Amoxicillin-pot Clavulanate Hives, Rash   Amoxicillin-pot Clavulanate Rash   Penicillin G Benzathine Rash   Penicillins Hives, Rash   30 years   Sulfa Antibiotics Hives, Rash   Sulfonamide Derivatives Hives, Rash        Medication List    acetaminophen 500 MG tablet Commonly known as: TYLENOL Take 500-1,000 mg by mouth every 6 (six) hours as needed for mild pain or moderate pain.   alendronate 70 MG tablet Commonly known as: FOSAMAX Take 1 tablet (70 mg total) by mouth every 7 (seven) days. Take with a full glass of water on an empty stomach.   ALPRAZolam 0.5 MG tablet Commonly known as: XANAX TAKE 1 TABLET TWICE DAILY AS NEEDED FOR AN ANXIETY, DO NOT TAKE AT NIGHT.   apixaban 5 MG Tabs tablet Commonly known as: ELIQUIS Take 1 tablet (5 mg total) by mouth 2 (two) times daily.   Azelastine HCl 0.15 % Soln 2 sprays twice a  day   Calcium Carb-Cholecalciferol 600-400 MG-UNIT Tabs Take 1 tablet by mouth daily.   Chloral Hydrate Crys Take 10 mls by mouth at bedtime as needed   clindamycin 300 MG capsule Commonly known as: CLEOCIN Per patient taking prior to dental appointment   desonide 0.05 % cream Commonly known as: DESOWEN   ferrous sulfate 325 (65 FE) MG tablet Take 325 mg by mouth 2 (two) times a week. Every Monday and Wednesday   furosemide 20 MG tablet Commonly known as: LASIX Take 1 tablet by mouth daily. If greater than 3 pound gain in 24 hours, increase to 2 tablets daily until weight returns to normal   ipratropium 0.06 % nasal spray Commonly known as: ATROVENT Place 2 sprays into both nostrils in the morning and at bedtime.   levocetirizine 5 MG tablet Commonly known as: XYZAL Take 1 tablet (5 mg total) by mouth every evening.   Magnesium Oxide 250 MG Tabs Take by mouth.   nitroGLYCERIN 0.4 MG SL tablet Commonly known as: NITROSTAT Place 1 tablet (0.4 mg total) under the tongue every 5 (five) minutes as needed for chest pain (CP or SOB).   pantoprazole 40 MG tablet Commonly known as: PROTONIX TAKE 1 TABLET BY MOUTH TWICE DAILY.   polyethylene glycol 17 g packet Commonly known as: MIRALAX / GLYCOLAX Take 17 g by mouth daily.   potassium chloride 10 MEQ tablet Commonly known as:  KLOR-CON Take 1 tablet (10 mEq total) by mouth daily.   rosuvastatin 40 MG tablet Commonly known as: CRESTOR Take 1 tablet (40 mg total) by mouth daily.   Ryaltris [id number] MCG/ACT Susp Generic drug: Olopatadine-Mometasone 2 sprays per nostril 1-2 times daily   Systane Ultra 0.4-0.3 % Soln Generic drug: Polyethyl Glycol-Propyl Glycol Place 1 drop into both eyes daily as needed (dry eyes).   tretinoin 0.05 % cream Commonly known as: RETIN-A Apply topically.   Vitamin D-3 25 MCG (1000 UT) Caps Take 1,000 Units by mouth daily.    Past Medical History:  Diagnosis Date   Allergy    Anemia     Anxiety    Aortic valve stenosis, severe    s/p AVR // mild to mod paravalvular leakage // [facility] 9/21: EF 55-60, no RWMA, mild LVH, GR 2 DD, mildly reduced RVSF, RVSP 38.7, moderate LAE, mild RAE, AVR with mean gradient 11 mmHg, mild paravalvular leakage   Arthritis    Breast cancer ([place]) 2007   right   Cataract    Constipation    Dysrhythmia    A-fib   Hiatal hernia    pt denies   Hypercholesteremia    Hypertension    Hypoglycemia    Osteopenia    Personal history of radiation therapy    Tingling    Ulcer 2012   bleeding gastric Ulcer    Past Surgical History:  Procedure Laterality Date   AORTIC VALVE REPLACEMENT N/A 11/16/2014   Procedure: AORTIC VALVE REPLACEMENT (AVR);  Surgeon: Gaye Pollack, MD;  Location: [place];  Service: Open Heart Surgery;  Laterality: N/A;   BIOPSY  12/15/2017   Procedure: BIOPSY;  Surgeon: Rush Landmark Telford Nab., MD;  Location: [place];  Service: Gastroenterology;;   BIOPSY  01/10/2021   Procedure: BIOPSY;  Surgeon: Irving Copas., MD;  Location: [place];  Service: Gastroenterology;;   BREAST BIOPSY     BREAST LUMPECTOMY Right    2005?   BREAST SURGERY     BRONCHIAL BIOPSY  03/06/2020   Procedure: BRONCHIAL BIOPSIES;  Surgeon: Collene Gobble, MD;  Location: [facility] ENDOSCOPY;  Service: Pulmonary;;   BRONCHIAL BRUSHINGS  03/06/2020   Procedure: BRONCHIAL BRUSHINGS;  Surgeon: Collene Gobble, MD;  Location: [facility] ENDOSCOPY;  Service: Pulmonary;;   BRONCHIAL NEEDLE ASPIRATION BIOPSY  03/06/2020   Procedure: BRONCHIAL NEEDLE ASPIRATION BIOPSIES;  Surgeon: Collene Gobble, MD;  Location: [facility] ENDOSCOPY;  Service: Pulmonary;;   BRONCHIAL WASHINGS  03/06/2020   Procedure: BRONCHIAL WASHINGS;  Surgeon: Collene Gobble, MD;  Location: [facility] ENDOSCOPY;  Service: Pulmonary;;   CARDIAC CATHETERIZATION N/A 10/18/2014   Procedure: Right/Left Heart Cath and Coronary Angiography;  Surgeon: [name], MD;  Location: [place] CV LAB;  Service:  Cardiovascular;  Laterality: N/A;   CARDIOVERSION N/A 12/09/2019   Procedure: CARDIOVERSION;  Surgeon: Josue Hector, MD;  Location: [place];  Service: Cardiovascular;  Laterality: N/A;   CARDIOVERSION N/A 12/30/2019   Procedure: CARDIOVERSION;  Surgeon: Josue Hector, MD;  Location: [facility] ENDOSCOPY;  Service: Cardiovascular;  Laterality: N/A;   COLONOSCOPY WITH PROPOFOL N/A 01/10/2021   Procedure: COLONOSCOPY WITH PROPOFOL;  Surgeon: Irving Copas., MD;  Location: [place];  Service: Gastroenterology;  Laterality: N/A;   CORONARY ANGIOGRAPHY N/A 02/04/2019   Procedure: CORONARY ANGIOGRAPHY;  Surgeon: Nelva Bush, MD;  Location: [place] CV LAB;  Service: Cardiovascular;  Laterality: N/A;   COSMETIC SURGERY     face   ESOPHAGOGASTRODUODENOSCOPY (EGD) WITH PROPOFOL  N/A 12/15/2017   Procedure: ESOPHAGOGASTRODUODENOSCOPY (EGD) WITH PROPOFOL;  Surgeon: Rush Landmark Telford Nab., MD;  Location: [place];  Service: Gastroenterology;  Laterality: N/A;   FIDUCIAL MARKER PLACEMENT  03/06/2020   Procedure: FIDUCIAL MARKER PLACEMENT;  Surgeon: Collene Gobble, MD;  Location: [facility] ENDOSCOPY;  Service: Pulmonary;;   PARATHYROIDECTOMY     TEE WITHOUT CARDIOVERSION N/A 11/16/2014   Procedure: TRANSESOPHAGEAL ECHOCARDIOGRAM (TEE);  Surgeon: Gaye Pollack, MD;  Location: [place];  Service: Open Heart Surgery;  Laterality: N/A;   TEE WITHOUT CARDIOVERSION N/A 12/09/2019   Procedure: TRANSESOPHAGEAL ECHOCARDIOGRAM (TEE);  Surgeon: Josue Hector, MD;  Location: [facility] ENDOSCOPY;  Service: Cardiovascular;  Laterality: N/A;   TONSILLECTOMY     VIDEO BRONCHOSCOPY WITH ENDOBRONCHIAL NAVIGATION Right 03/06/2020   Procedure: VIDEO BRONCHOSCOPY WITH ENDOBRONCHIAL NAVIGATION;  Surgeon: Collene Gobble, MD;  Location[facility] ENDOSCOPY;  Service: Pulmonary;  Laterality: Right;    Review of systems negative except as noted in HPI / PMHx or noted below:  Review of Systems  Constitutional: Negative.   HENT:  Negative.    Eyes: Negative.   Respiratory: Negative.    Cardiovascular: Negative.   Gastrointestinal: Negative.   Genitourinary: Negative.   Musculoskeletal: Negative.   Skin: Negative.   Neurological: Negative.   Endo/Heme/Allergies: Negative.   Psychiatric/Behavioral: Negative.       Objective:   Vitals:   06/24/22 0839  BP: 110/60  Pulse: 90  Resp: 16  Temp: 97.7 F (36.5 C)  SpO2: 95%   Height: 5' 4.17" (163 cm)  Weight: 160 lb 6.4 oz (72.8 kg)   Physical Exam HENT:     Nose: No nasal deformity, mucosal edema or rhinorrhea.     Diagnostics: none  Assessment and Plan:   1. Perennial allergic rhinitis   2. Nonallergic rhinitis    1. Can use one of the following:  A.  Ryaltris - 2 sprays each nostril 1-2 times per day (specialty pharmacy) B.  Dymista - 1 spray each nostril 1-2 times per day  2. IF NEEDED:   A. Ipratropium 0.06% - 2 sprays each nostril every 6 hours to dry nose  B. Levocetirizine 5 mg - 1 tablet 1 time per day  3. Return to clinic in 12 weeks  Adrina will use some form of combination nasal steroid/antihistamine on a consistent basis and she can use her nasal ipratropium as needed and we will see what happens over the course of the next 12 weeks with this approach.  Allena Katz, MD Allergy / Immunology [place]

## 2022-06-25 ENCOUNTER — Encounter: Payer: Self-pay | Admitting: Allergy and Immunology

## 2022-06-26 ENCOUNTER — Encounter: Payer: Self-pay | Admitting: Internal Medicine

## 2022-06-26 ENCOUNTER — Telehealth: Payer: Self-pay | Admitting: Cardiovascular Disease

## 2022-06-26 NOTE — Telephone Encounter (Signed)
Per OV note on 04/18/22:  Recent exacerbation, now improved after increasing furosemide. May impart be due to the stress of her viral illness or to poorly controlled atrial fibrillation. She will remain on furosemide 20 mg daily. She can increase to 40 mg as she has done based on a sliding scale with her weight.   Returned call to patient who states that she is only short of breath if climbing stairs or going up an incline. She states she knows this is due to excess fluid. Wanted to be sure she can take '40mg'$  of Furosemide to get the fluid off. Advised patient of above comments by Dr Burt Knack and that she can do this for up to 4-5 days, but if not resolved by then, she should let us know. She confirmed she's not having any other symptoms. States she will call back on Monday if her weight is not back to baseline or still having symptoms.

## 2022-06-26 NOTE — Telephone Encounter (Signed)
Pt c/o swelling: STAT is pt has developed SOB within 24 hours  If swelling, where is the swelling located? She can feel it in her legs. She states she is wearing compression stocking.   How much weight have you gained and in what time span? 5lbs in 5 days.   Have you gained 3 pounds in a day or 5 pounds in a week? No   Do you have a log of your daily weights (if so, list)? no  Are you currently taking a fluid pill? Yes.   Are you currently SOB? SOB started about a week ago, only when she is walking.  Have you traveled recently? no   She wants to know if she can double up on her lasix for the next 2 to 3 days.    Patient c/o Palpitations:  High priority if patient c/o lightheadedness, shortness of breath, or chest pain  How long have you had palpitations/irregular HR/ Afib? States she has been in A-fib a week or two.  Are you having the symptoms now? yes  Are you currently experiencing lightheadedness, SOB or CP? Has some SOB that started a week ago, only when when she is walking.   Do you have a history of afib (atrial fibrillation) or irregular heart rhythm? Yes   Have you checked your BP or HR? (document readings if available): no  Are you experiencing any other symptoms? No other symptoms

## 2022-07-10 ENCOUNTER — Telehealth: Payer: Self-pay | Admitting: *Deleted

## 2022-07-10 NOTE — Telephone Encounter (Signed)
CALLED PATIENT TO ASK ABOUT RESCHEDULING APPTS., PATIENT WANTS TO KEEP HER CT ON 07-23-22 BUT SHE WANTS HER TELEPHONE FU RESCHEDULED FOR 07-29-22 @ 8:30 AM, APPT. CHANGED BEEN CHANGED TO 07-29-22 @ 8:30 AM PER PATIENT REQUEST

## 2022-07-23 ENCOUNTER — Ambulatory Visit (HOSPITAL_COMMUNITY)
Admission: RE | Admit: 2022-07-23 | Discharge: 2022-07-23 | Disposition: A | Discharge status: Home / Self Care | Payer: PPO | Source: Ambulatory Visit | Attending: Radiation Oncology | Admitting: Radiation Oncology

## 2022-07-23 DIAGNOSIS — C3411 Malignant neoplasm of upper lobe, right bronchus or lung: Secondary | ICD-10-CM

## 2022-07-23 DIAGNOSIS — I5032 Chronic diastolic (congestive) heart failure: Secondary | ICD-10-CM

## 2022-07-23 LAB — POCT I-STAT CREATININE: Creatinine, Ser: 0.9 mg/dL (ref 0.44–1.00)

## 2022-07-23 MED ORDER — IOHEXOL 300 MG/ML  SOLN
75.0000 mL | Freq: Once | INTRAMUSCULAR | Status: AC | PRN
  Administered 2022-07-23: 75 mL via INTRAVENOUS

## 2022-07-23 MED ORDER — SODIUM CHLORIDE (PF) 0.9 % IJ SOLN
INTRAMUSCULAR | Status: AC
  Filled 2022-07-23: qty 50

## 2022-07-26 ENCOUNTER — Other Ambulatory Visit: Payer: Self-pay | Admitting: Internal Medicine

## 2022-07-28 ENCOUNTER — Ambulatory Visit: Payer: Self-pay | Admitting: Radiation Oncology

## 2022-07-28 NOTE — Progress Notes (Signed)
Radiation Oncology         [phone] ________________________________  Initial Outpatient Consultation - Conducted via telephone at patient request.  I spoke with the patient to conduct this  visit via telephone. The patient was notified in advance and was offered an in person or telemedicine meeting to allow for face to face communication but instead preferred to proceed with a telephone visit. e.     Name: [name]        MRN: [id number]  Date of Service: 07/29/2022 DOB: 11/21/1932  [id number], Claudina Lick, MD  [name], MD     REFERRING PHYSICIAN: [name], MD   DIAGNOSIS: There were no encounter diagnoses.   HISTORY OF PRESENT ILLNESS: [name] is a 89 y.o. female with a history of Stage IA2, cT1bN0M0, NSCLC, adenocarcinoma of the RUL.  The patient has a history of right breast cancer treated in [place], [place] with lumpectomy and adjuvant radiotherapy in 2006. She also has a history of valvular disease and underwent an aortic valve replacement in July 2016 by Dr. Nell Range, as well as atrial fibrillation and diastolic CHF. The patient has been followed by pulmonary medicine for a right upper lobe pulmonary nodule that was originally identified in May 2018, notes indicate that there had been a chest wall lipoma and this CT scan was performed due to this work-up. Over time, though it was unchanged in overall size there was new solid component in the medial aspect measuring up to 4 mm, no evidence of adenopathy was identified, and she was counseled on the rationale for bronchoscopy which was performed on 03/06/2020. Biopsies and cytology of the right upper lobe from fine-needle aspiration and brushing were consistent with malignant cells morphologically consistent with an adenocarcinoma.  Atypical cells were also seen in the right upper lobe lavage.  She did undergo PET imaging on 03/30/2020, the right upper lobe nodule seen previously by CT showed an SUV max of 1.9,  associated fiducial markers were seen, and no additional hypermetabolic changes were identified in the lung or within the lymph node regions.  No other hypermetabolic activity was noted throughout the body.    She proceeded with stereotactic body radiotherapy (SBRT) in 3 fractions which she completed to the RUL target in December 2021. She's been followed in surveillance since. Interestingly her surveilance scans have identified pneumatosis of the colon. She underwent work up with additional imaging and met with Dr. Rush Landmark regarding these findings. A Cologuard was positive but at the time of colonoscopy in September 2022 she had focal changes of the bowel wall consistent with her pneumatosis and a transverse polyp that was consistent with tubular adenoma. Dr. Rush Landmark does not recommend repeat endoscopy.   While she has had some inflammatory findings at or after infections in the lungs, she has been stable from an oncologic perspective without recurrences. She does have chronic  pneumatosis in the splenic flexure of the colon. Her most recent scan on 07/23/22 showed borderline cardiomegaly, evidence of atherosclerotic disease, and prior atrial valve replacement. She had two stable subcentimeter right lung nodules, 3 mm and 7 mm, and new tree in bud changes in the LLL and a tiny stable cyst in the lateral aspect of the liver. She's contacted today to review these results.    PREVIOUS RADIATION THERAPY:    04/18/20-04/26/20 SBRT Treatment: The RUL target was treated to 54 Gy in 3 fractions  About 2006: The patient received adjuvant right breast radiotherapy. Details are  not known at this point about her dosing.   PAST MEDICAL HISTORY:  Past Medical History:  Diagnosis Date   Allergy    Anemia    Anxiety    Aortic valve stenosis, severe    s/p AVR // mild to mod paravalvular leakage // Echo 9/21: EF 55-60, no RWMA, mild LVH, GR 2 DD, mildly reduced RVSF, RVSP 38.7, moderate LAE, mild RAE,  AVR with mean gradient 11 mmHg, mild paravalvular leakage   Arthritis    Breast cancer ([place]) 2007   right   Cataract    Constipation    Dysrhythmia    A-fib   Hiatal hernia    pt denies   Hypercholesteremia    Hypertension    Hypoglycemia    Osteopenia    Personal history of radiation therapy    Tingling    Ulcer 2012   bleeding gastric Ulcer       PAST SURGICAL HISTORY: Past Surgical History:  Procedure Laterality Date   AORTIC VALVE REPLACEMENT N/A 11/16/2014   Procedure: AORTIC VALVE REPLACEMENT (AVR);  Surgeon: Gaye Pollack, MD;  Location: [place];  Service: Open Heart Surgery;  Laterality: N/A;   BIOPSY  12/15/2017   Procedure: BIOPSY;  Surgeon: Rush Landmark Telford Nab., MD;  Location: [place];  Service: Gastroenterology;;   BIOPSY  01/10/2021   Procedure: BIOPSY;  Surgeon: Irving Copas., MD;  Location: [place];  Service: Gastroenterology;;   BREAST BIOPSY     BREAST LUMPECTOMY Right    2005?   BREAST SURGERY     BRONCHIAL BIOPSY  03/06/2020   Procedure: BRONCHIAL BIOPSIES;  Surgeon: Collene Gobble, MD;  Location[facility] ENDOSCOPY;  Service: Pulmonary;;   BRONCHIAL BRUSHINGS  03/06/2020   Procedure: BRONCHIAL BRUSHINGS;  Surgeon: Collene Gobble, MD;  Location: [facility] ENDOSCOPY;  Service: Pulmonary;;   BRONCHIAL NEEDLE ASPIRATION BIOPSY  03/06/2020   Procedure: BRONCHIAL NEEDLE ASPIRATION BIOPSIES;  Surgeon: Collene Gobble, MD;  Location: [facility] ENDOSCOPY;  Service: Pulmonary;;   BRONCHIAL WASHINGS  03/06/2020   Procedure: BRONCHIAL WASHINGS;  Surgeon: Collene Gobble, MD;  Location: [facility] ENDOSCOPY;  Service: Pulmonary;;   CARDIAC CATHETERIZATION N/A 10/18/2014   Procedure: Right/Left Heart Cath and Coronary Angiography;  Surgeon: [name], MD;  Location: [place] CV LAB;  Service: Cardiovascular;  Laterality: N/A;   CARDIOVERSION N/A 12/09/2019   Procedure: CARDIOVERSION;  Surgeon: Josue Hector, MD;  Location: [place];  Service: Cardiovascular;   Laterality: N/A;   CARDIOVERSION N/A 12/30/2019   Procedure: CARDIOVERSION;  Surgeon: Josue Hector, MD;  Location: [facility] ENDOSCOPY;  Service: Cardiovascular;  Laterality: N/A;   COLONOSCOPY WITH PROPOFOL N/A 01/10/2021   Procedure: COLONOSCOPY WITH PROPOFOL;  Surgeon: Irving Copas., MD;  Location: [place];  Service: Gastroenterology;  Laterality: N/A;   CORONARY ANGIOGRAPHY N/A 02/04/2019   Procedure: CORONARY ANGIOGRAPHY;  Surgeon: Nelva Bush, MD;  Location: [place] CV LAB;  Service: Cardiovascular;  Laterality: N/A;   COSMETIC SURGERY     face   ESOPHAGOGASTRODUODENOSCOPY (EGD) WITH PROPOFOL N/A 12/15/2017   Procedure: ESOPHAGOGASTRODUODENOSCOPY (EGD) WITH PROPOFOL;  Surgeon: Rush Landmark Telford Nab., MD;  Location: [place];  Service: Gastroenterology;  Laterality: N/A;   FIDUCIAL MARKER PLACEMENT  03/06/2020   Procedure: FIDUCIAL MARKER PLACEMENT;  Surgeon: Collene Gobble, MD;  Location: [facility] ENDOSCOPY;  Service: Pulmonary;;   PARATHYROIDECTOMY     TEE WITHOUT CARDIOVERSION N/A 11/16/2014   Procedure: TRANSESOPHAGEAL ECHOCARDIOGRAM (TEE);  Surgeon: Gaye Pollack, MD;  Location: [facility]  OR;  Service: Open Heart Surgery;  Laterality: N/A;   TEE WITHOUT CARDIOVERSION N/A 12/09/2019   Procedure: TRANSESOPHAGEAL ECHOCARDIOGRAM (TEE);  Surgeon: Josue Hector, MD;  Location: [facility] ENDOSCOPY;  Service: Cardiovascular;  Laterality: N/A;   TONSILLECTOMY     VIDEO BRONCHOSCOPY WITH ENDOBRONCHIAL NAVIGATION Right 03/06/2020   Procedure: VIDEO BRONCHOSCOPY WITH ENDOBRONCHIAL NAVIGATION;  Surgeon: Collene Gobble, MD;  Location: [facility] ENDOSCOPY;  Service: Pulmonary;  Laterality: Right;     FAMILY HISTORY:  Family History  Problem Relation Age of Onset   Hypertension Mother    Rectal cancer Mother    Colon cancer Mother    Kidney disease Father    Cancer Cousin 79       breast and ovarian    Esophageal cancer Neg Hx    Stomach cancer Neg Hx    Colon polyps Neg Hx      SOCIAL  HISTORY:  reports that she quit smoking about 38 years ago. Her smoking use included cigarettes. She started smoking about 53 years ago. She has a 7.50 pack-year smoking history. She has never used smokeless tobacco. She reports current alcohol use of about 7.0 standard drinks of alcohol per week. She reports that she does not use drugs.  The patient is married and lives in [place] in independent living at [facility].     ALLERGIES: Lactose intolerance (gi), Amoxicillin-pot clavulanate, Amoxicillin-pot clavulanate, Penicillin g benzathine, Penicillins, Sulfa antibiotics, and Sulfonamide derivatives   MEDICATIONS:  Current Outpatient Medications  Medication Sig Dispense Refill   acetaminophen (TYLENOL) 500 MG tablet Take 500-1,000 mg by mouth every 6 (six) hours as needed for mild pain or moderate pain.      alendronate (FOSAMAX) 70 MG tablet Take 1 tablet (70 mg total) by mouth every 7 (seven) days. Take with a full glass of water on an empty stomach. 12 tablet 3   ALPRAZolam (XANAX) 0.5 MG tablet TAKE 1 TABLET TWICE DAILY AS NEEDED FOR AN ANXIETY, DO NOT TAKE AT NIGHT. 180 tablet 0   apixaban (ELIQUIS) 5 MG TABS tablet Take 1 tablet (5 mg total) by mouth 2 (two) times daily. 180 tablet 2   Azelastine HCl 0.15 % SOLN 2 sprays twice a day     Azelastine-Fluticasone (DYMISTA) 137-50 MCG/ACT SUSP 1 spray each nostril 1-2 times per day 23 g 1   Calcium Carb-Cholecalciferol 600-400 MG-UNIT TABS Take 1 tablet by mouth daily.     Chloral Hydrate CRYS Take 10 mls by mouth at bedtime as needed 900 g 5   Cholecalciferol (VITAMIN D-3) 1000 UNITS CAPS Take 1,000 Units by mouth daily.      clindamycin (CLEOCIN) 300 MG capsule Per patient taking prior to dental appointment     desonide (DESOWEN) 0.05 % cream      ferrous sulfate 325 (65 FE) MG tablet Take 325 mg by mouth 2 (two) times a week. Every Monday and Wednesday     furosemide (LASIX) 20 MG tablet Take 1 tablet by mouth daily. If greater than 3  pound gain in 24 hours, increase to 2 tablets daily until weight returns to normal 90 tablet 3   ipratropium (ATROVENT) 0.06 % nasal spray 2 sprays each nostril every 6 hours to dry nose 15 mL 5   levocetirizine (XYZAL) 5 MG tablet Take 1 tablet (5 mg total) by mouth every evening. 90 tablet 1   Magnesium Oxide 250 MG TABS Take by mouth.     nitroGLYCERIN (NITROSTAT) 0.4 MG SL tablet Place 1  tablet (0.4 mg total) under the tongue every 5 (five) minutes as needed for chest pain (CP or SOB). 25 tablet 3   pantoprazole (PROTONIX) 40 MG tablet TAKE 1 TABLET BY MOUTH TWICE DAILY. 180 tablet 3   Polyethyl Glycol-Propyl Glycol (SYSTANE ULTRA) 0.4-0.3 % SOLN Place 1 drop into both eyes daily as needed (dry eyes).     polyethylene glycol (MIRALAX / GLYCOLAX) 17 g packet Take 17 g by mouth daily.     potassium chloride (KLOR-CON) 10 MEQ tablet Take 1 tablet (10 mEq total) by mouth daily. 90 tablet 2   rosuvastatin (CRESTOR) 40 MG tablet Take 1 tablet (40 mg total) by mouth daily. 90 tablet 3   RYALTRIS 665-25 MCG/ACT SUSP 2 sprays per nostril 1-2 times daily 29 g 5   tretinoin (RETIN-A) 0.05 % cream Apply topically.     No current facility-administered medications for this visit.     REVIEW OF SYSTEMS: On review of systems, the patient is doing pretty well. She's had ongoing chronic cough as long as I've known her but no new reported symptoms. ***  PHYSICAL EXAM:    Unable to assess due to encounter type.     ECOG = 1  0 - Asymptomatic (Fully active, able to carry on all predisease activities without restriction)  1 - Symptomatic but completely ambulatory (Restricted in physically strenuous activity but ambulatory and able to carry out work of a light or sedentary nature. For example, light housework, office work)  2 - Symptomatic, <50% in bed during the day (Ambulatory and capable of all self care but unable to carry out any work activities. Up and about more than 50% of waking hours)  3 -  Symptomatic, >50% in bed, but not bedbound (Capable of only limited self-care, confined to bed or chair 50% or more of waking hours)  4 - Bedbound (Completely disabled. Cannot carry on any self-care. Totally confined to bed or chair)  5 - Death   Eustace Pen MM, Creech RH, Tormey DC, et al. [id number]). "Toxicity and response criteria of the [facility]". [place] Oncol. 5 (6): 649-55    LABORATORY DATA:  Lab Results  Component Value Date   WBC 6.0 02/18/2022   HGB 14.1 02/18/2022   HCT 42.8 02/18/2022   MCV 98.2 02/18/2022   PLT 128.0 (L) 02/18/2022   Lab Results  Component Value Date   NA 140 02/18/2022   K 3.9 02/18/2022   CL 103 02/18/2022   CO2 30 02/18/2022   Lab Results  Component Value Date   ALT 25 02/18/2022   AST 31 02/18/2022   ALKPHOS 78 02/18/2022   BILITOT 0.8 02/18/2022      RADIOGRAPHY: CT CHEST W CONTRAST  Result Date: 07/24/2022 CLINICAL DATA:  Non-small-cell carcinoma of the right upper lobe. Restaging. * Tracking Code: BO * EXAM: CT CHEST WITH CONTRAST TECHNIQUE: Multidetector CT imaging of the chest was performed during intravenous contrast administration. RADIATION DOSE REDUCTION: This exam was performed according to the departmental dose-optimization program which includes automated exposure control, adjustment of the mA and/or kV according to patient size and/or use of iterative reconstruction technique. CONTRAST:  9mL OMNIPAQUE IOHEXOL 300 MG/ML  SOLN COMPARISON:  1923 FINDINGS: Cardiovascular: Heart size upper normal. No pericardial effusion. Coronary artery calcification is evident. Mild atherosclerotic calcification is noted in the wall of the thoracic aorta. Status post aortic valve replacement. Mediastinum/Nodes: No mediastinal lymphadenopathy. There is no hilar lymphadenopathy. The esophagus has normal imaging features.  There is no axillary lymphadenopathy. Lungs/Pleura: Post treatment scarring noted parahilar right upper lung.  Fiducial marker has become incorporated into the fibrosis. Stable volume loss right hemithorax secondary to the scarring. 3 mm right lower lobe nodule on 55/5 is stable. 7 mm anterior right lung nodule on 69/5 is stable. Stable scarring inferior lingula. New subtle focus of tree-in-bud nodularity noted posterior left lower lobe (81/5) suggesting atypical infection. No pleural effusion. Upper Abdomen: Tiny cyst in the lateral liver is unchanged. No followup imaging is recommended. Pneumatosis in the splenic flexure of the colon is stable. No edema or inflammation in this region on today's study. Musculoskeletal: No worrisome lytic or sclerotic osseous abnormality. IMPRESSION: 1. No findings to suggest recurrent or metastatic disease. 2. Post treatment scarring parahilar right upper lung. 3. Small cluster of tree-in-bud nodularity in the posterior left lower lobe. Imaging features suggest atypical infection. 4. Continued persistence of pneumatosis in the splenic flexure of the colon. Given persistence, this is likely benign although etiology is not evident on this chest CT. 5.  Aortic Atherosclerosis (ICD10-I70.0). Electronically Signed   By: Misty Stanley M.D.   On: 07/24/2022 10:57        IMPRESSION/PLAN: 1. Stage IA2, cT1bN0M0, NSCLC, adenocarcinoma of the RUL. We discussed the reassuring findings on her most recent surveillance CT scan. We also discussed the plan for repeat scan at 6 months per NCCN Guidelines. She is in agreement with this plan and will contact me sooner if she has questions or concerns prior to her next visit. 2.  Pneumatosis of the splenic flexure. This will be followed expectantly but she understands the need to proceed with evaluation if she developed acute symptoms of pain, fever, or abrupt GI symptoms.  3. Tree in Bud changes in the LLL.    This encounter was conducted via telephone.  The patient has provided two factor identification and has given verbal consent for this type of  encounter and has been advised to only accept a meeting of this type in a secure network environment. The time spent during this encounter was *** minutes including preparation, discussion, and coordination of the patient's care. The attendants for this meeting include  Hayden Pedro  and [place].  During the encounter,  Hayden Pedro was located at [facility] Radiation Oncology Department.  Catrinia L Nahm was located at home    The above documentation reflects my direct findings during this shared patient visit. Please see the separate note by Dr. Lisbeth Renshaw on this date for the remainder of the patient's plan of care.    Carola Rhine, PAC

## 2022-07-29 ENCOUNTER — Encounter: Payer: Self-pay | Admitting: Radiation Oncology

## 2022-07-29 ENCOUNTER — Ambulatory Visit
Admission: RE | Admit: 2022-07-29 | Discharge: 2022-07-29 | Disposition: A | Discharge status: Home / Self Care | Payer: PPO | Source: Ambulatory Visit | Attending: Radiation Oncology | Admitting: Radiation Oncology

## 2022-07-29 DIAGNOSIS — K6389 Other specified diseases of intestine: Secondary | ICD-10-CM

## 2022-07-29 DIAGNOSIS — C3411 Malignant neoplasm of upper lobe, right bronchus or lung: Secondary | ICD-10-CM

## 2022-07-29 NOTE — Progress Notes (Signed)
Telephone nursing appointment for patient to receive most recent scan results from 07/24/22. I verified patient's identity and began nursing interview. Patient reports doing well. No other issus conveyed at this time.   Meaningful use complete.   Patient aware of their 8:30am-07/29/22 telephone appointment w/ Shona Simpson PA-C. I left my extension [phone] in case patient needs anything. Patient verbalized understanding. This concludes the nursing interview.   Patient contact [phone]     Leandra Kern, LPN

## 2022-08-10 ENCOUNTER — Other Ambulatory Visit: Payer: Self-pay

## 2022-08-10 ENCOUNTER — Emergency Department (HOSPITAL_BASED_OUTPATIENT_CLINIC_OR_DEPARTMENT_OTHER)
Admission: EM | Admit: 2022-08-10 | Discharge: 2022-08-10 | Disposition: A | Discharge status: Home / Self Care | Payer: PPO | Attending: Emergency Medicine | Admitting: Emergency Medicine

## 2022-08-10 ENCOUNTER — Encounter (HOSPITAL_BASED_OUTPATIENT_CLINIC_OR_DEPARTMENT_OTHER): Payer: Self-pay

## 2022-08-10 ENCOUNTER — Encounter: Payer: Self-pay | Admitting: Internal Medicine

## 2022-08-10 ENCOUNTER — Emergency Department (HOSPITAL_BASED_OUTPATIENT_CLINIC_OR_DEPARTMENT_OTHER): Payer: PPO

## 2022-08-10 DIAGNOSIS — R1031 Right lower quadrant pain: Secondary | ICD-10-CM | POA: Diagnosis not present

## 2022-08-10 DIAGNOSIS — Z7901 Long term (current) use of anticoagulants: Secondary | ICD-10-CM | POA: Insufficient documentation

## 2022-08-10 DIAGNOSIS — I480 Paroxysmal atrial fibrillation: Secondary | ICD-10-CM | POA: Insufficient documentation

## 2022-08-10 LAB — CBC
HCT: 41.2 % (ref 36.0–46.0)
Hemoglobin: 13.5 g/dL (ref 12.0–15.0)
MCH: 32 pg (ref 26.0–34.0)
MCHC: 32.8 g/dL (ref 30.0–36.0)
MCV: 97.6 fL (ref 80.0–100.0)
Platelets: 120 10*3/uL — ABNORMAL LOW (ref 150–400)
RBC: 4.22 MIL/uL (ref 3.87–5.11)
RDW: 13.6 % (ref 11.5–15.5)
WBC: 7.1 10*3/uL (ref 4.0–10.5)
nRBC: 0 % (ref 0.0–0.2)

## 2022-08-10 LAB — COMPREHENSIVE METABOLIC PANEL
ALT: 30 U/L (ref 0–44)
AST: 33 U/L (ref 15–41)
Albumin: 4.4 g/dL (ref 3.5–5.0)
Alkaline Phosphatase: 83 U/L (ref 38–126)
Anion gap: 9 (ref 5–15)
BUN: 19 mg/dL (ref 8–23)
CO2: 22 mmol/L (ref 22–32)
Calcium: 9.2 mg/dL (ref 8.9–10.3)
Chloride: 107 mmol/L (ref 98–111)
Creatinine, Ser: 0.84 mg/dL (ref 0.44–1.00)
GFR, Estimated: >60 mL/min (ref >60)
Glucose, Bld: 117 mg/dL — ABNORMAL HIGH (ref 70–99)
Potassium: 4.2 mmol/L (ref 3.5–5.1)
Sodium: 138 mmol/L (ref 135–145)
Total Bilirubin: 0.9 mg/dL (ref 0.3–1.2)
Total Protein: 6.1 g/dL — ABNORMAL LOW (ref 6.5–8.1)

## 2022-08-10 LAB — URINALYSIS, W/ REFLEX TO CULTURE (INFECTION SUSPECTED)
Bacteria, UA: NONE SEEN
Bilirubin Urine: NEGATIVE
Glucose, UA: NEGATIVE mg/dL
Ketones, ur: NEGATIVE mg/dL
Leukocytes,Ua: NEGATIVE
Nitrite: NEGATIVE
Protein, ur: NEGATIVE mg/dL
Specific Gravity, Urine: 1.009 (ref 1.005–1.030)
pH: 5.5 (ref 5.0–8.0)

## 2022-08-10 LAB — LIPASE, BLOOD: Lipase: 22 U/L (ref 11–51)

## 2022-08-10 MED ORDER — IOHEXOL 300 MG/ML  SOLN
100.0000 mL | Freq: Once | INTRAMUSCULAR | Status: AC | PRN
  Administered 2022-08-10: 80 mL via INTRAVENOUS

## 2022-08-10 NOTE — ED Triage Notes (Signed)
Patient here POV from [facility].  Endorses ABD Pain mainly to Right AD that radiates to Mid ABD. Began a few days ago.   History of Ulcers. No N/V/D. No Changes in BM.  NAD Noted During Triage. A&Ox4. GCS 15. Ambulatory.

## 2022-08-10 NOTE — Discharge Instructions (Addendum)
Your scans and blood work today are normal.  May be something in your muscles.  Use Tylenol as needed for the discomfort.  However if you start having fever, nausea vomiting, inability to eat, no further bowel movements you need to return to the emergency room.

## 2022-08-10 NOTE — ED Notes (Signed)
Dc instructions reviewed with patient. Patient voiced understanding. Dc with belongings.  °

## 2022-08-10 NOTE — ED Provider Notes (Signed)
[place] EMERGENC[facility] DEPARTMENT AT [facility] Provider Note   CSN: [id number] Arrival date & time: 08/10/22  1052     History  Chief Complaint  Patient presents with   Abdominal Pain    [name] is a 89 y.o. female.  Patient is an 89 year old female with a history of aortic valve replacement due to aortic stenosis, anemia, peptic ulcers with a bleeding gastric ulcer in 2012 and colon polyps as well as paroxysmal atrial fibrillation on Eliquis who is presenting today with complaint of right-sided abdominal pain.  She reports that she noticed this pain a few days ago and it has been persistent.  This morning when she sat down to have breakfast she noticed it was still there and it is not resolving and now starting to move slightly toward the middle of her abdomen.  Does not seem to be affected by eating and she is still hide a hearty appetite, no radiation into the back or urinary changes.  She denies any nausea or vomiting.  Last stool was this morning and was at her baseline.  She does take MiraLAX daily due to chronic constipation but has not had any changes.  No prior abdominal surgeries and no history of abdominal pain in the past.  The history is provided by the patient and medical records.  Abdominal Pain      Home Medications Prior to Admission medications   Medication Sig Start Date End Date Taking? Authorizing Provider  acetaminophen (TYLENOL) 500 MG tablet Take 500-1,000 mg by mouth every 6 (six) hours as needed for mild pain or moderate pain.     [provider]  alendronate (FOSAMAX) 70 MG tablet Take 1 tablet (70 mg total) by mouth every 7 (seven) days. Take with a full glass of water on an empty stomach. 07/28/22   Burns, Bobette Mo, MD  ALPRAZolam (XANAX) 0.5 MG tablet TAKE 1 TABLET TWICE DAILY AS NEEDED FOR AN ANXIETY, DO NOT TAKE AT NIGHT. 03/17/22   Pincus Sanes, MD  apixaban (ELIQUIS) 5 MG TABS tablet Take 1 tablet (5 mg total) by mouth 2  (two) times daily. 05/05/22   Tonny Bollman, MD  Azelastine HCl 0.15 % SOLN 2 sprays twice a day 08/13/21   [provider]  Azelastine-Fluticasone [facility]) 137-50 MCG/ACT SUSP 1 spray each nostril 1-2 times per day 06/24/22   Jessica Priest, MD  Calcium Carb-Cholecalciferol 600-400 MG-UNIT TABS Take 1 tablet by mouth daily.    [provider]  Chloral Hydrate CRYS Take 10 mls by mouth at bedtime as needed 12/31/21   Pincus Sanes, MD  Cholecalciferol (VITAMIN D-3) 1000 UNITS CAPS Take 1,000 Units by mouth daily.     [provider]  clindamycin (CLEOCIN) 300 MG capsule Per patient taking prior to dental appointment 07/10/21   [provider]  desonide (DESOWEN) 0.05 % cream  06/04/21   [provider]  ferrous sulfate 325 (65 FE) MG tablet Take 325 mg by mouth 2 (two) times a week. Every Monday and Wednesday    [provider]  furosemide (LASIX) 20 MG tablet Take 1 tablet by mouth daily. If greater than 3 pound gain in 24 hours, increase to 2 tablets daily until weight returns to normal 09/30/21   Tonny Bollman, MD  ipratropium (ATROVENT) 0.06 % nasal spray 2 sprays each nostril every 6 hours to dry nose 06/24/22   Kozlow, Alvira Philips, MD  levocetirizine (XYZAL) 5 MG tablet Take 1 tablet (  5 mg total) by mouth every evening. 06/24/22   Kozlow, Alvira Philips, MD  Magnesium Oxide 250 MG TABS Take by mouth.    [provider]  nitroGLYCERIN (NITROSTAT) 0.4 MG SL tablet Place 1 tablet (0.4 mg total) under the tongue every 5 (five) minutes as needed for chest pain (CP or SOB). 02/05/19   [facility], Roe Rutherford, PA  pantoprazole (PROTONIX) 40 MG tablet TAKE 1 TABLET BY MOUTH TWICE DAILY. 11/05/21   Mansouraty, Netty Starring., MD  Polyethyl Glycol-Propyl Glycol (SYSTANE ULTRA) 0.4-0.3 % SOLN Place 1 drop into both eyes daily as needed (dry eyes).    [provider]  polyethylene glycol (MIRALAX / GLYCOLAX) 17 g packet Take 17 g by mouth daily.    [provider]  potassium chloride (KLOR-CON) 10 MEQ tablet Take 1 tablet (10 mEq total) by mouth daily. 03/07/22   Tonny Bollman, MD  rosuvastatin (CRESTOR) 40 MG tablet Take 1 tablet (40 mg total) by mouth daily. 02/22/22   Pincus Sanes, MD  RYALTRIS [phone] MCG/ACT SUSP 2 sprays per nostril 1-2 times daily 05/27/22   Kozlow, Alvira Philips, MD  tretinoin (RETIN-A) 0.05 % cream Apply topically. 06/04/21   [provider]      Allergies    Lactose intolerance (gi), Amoxicillin-pot clavulanate, Amoxicillin-pot clavulanate, Penicillin g benzathine, Penicillins, Sulfa antibiotics, and Sulfonamide derivatives    Review of Systems   Review of Systems  Gastrointestinal:  Positive for abdominal pain.    Physical Exam Updated Vital Signs BP (!) 137/94 (BP Location: Left Arm)   Pulse 93   Temp (!) 97.4 F (36.3 C) (Oral)   Resp 18   Ht 5' 4.17" (1.63 m)   Wt 72.8 kg   SpO2 97%   BMI 27.40 kg/m  Physical Exam Vitals and nursing note reviewed.  Constitutional:      General: She is not in acute distress.    Appearance: She is well-developed.  HENT:     Head: Normocephalic and atraumatic.  Eyes:     Pupils: Pupils are equal, round, and reactive to light.  Cardiovascular:     Rate and Rhythm: Normal rate and regular rhythm.     Heart sounds: Normal heart sounds. No murmur heard.    No friction rub.  Pulmonary:     Effort: Pulmonary effort is normal.     Breath sounds: Normal breath sounds. No wheezing or rales.  Abdominal:     General: Bowel sounds are normal. There is no distension.     Palpations: Abdomen is soft. There is no hepatomegaly or splenomegaly.     Tenderness: There is no abdominal tenderness. There is no right CVA tenderness, guarding or rebound.     Comments: Minimal tenderness in the right abd  Musculoskeletal:        General: No tenderness. Normal range of motion.     Comments: No edema  Skin:    General: Skin is warm and dry.     Findings: No rash.   Neurological:     Mental Status: She is alert and oriented to person, place, and time. Mental status is at baseline.     Cranial Nerves: No cranial nerve deficit.  Psychiatric:        Mood and Affect: Mood normal.        Behavior: Behavior normal.     ED Results / Procedures / Treatments   Labs (all labs ordered are listed, but only abnormal results are displayed) Labs Reviewed  COMPREHENSIVE METABOLIC PANEL - Abnormal; Notable for the following components:      Result Value   Glucose, Bld 117 (*)    Total Protein 6.1 (*)    All other components within normal limits  CBC - Abnormal; Notable for the following components:   Platelets 120 (*)    All other components within normal limits  URINALYSIS, W/ REFLEX TO CULTURE (INFECTION SUSPECTED) - Abnormal; Notable for the following components:   Color, Urine COLORLESS (*)    Hgb urine dipstick TRACE (*)    All other components within normal limits  LIPASE, BLOOD    EKG None  Radiology CT ABDOMEN PELVIS W CONTRAST  Result Date: 08/10/2022 CLINICAL DATA:  Right lower quadrant pain EXAM: CT ABDOMEN AND PELVIS WITH CONTRAST TECHNIQUE: Multidetector CT imaging of the abdomen and pelvis was performed using the standard protocol following bolus administration of intravenous contrast. RADIATION DOSE REDUCTION: This exam was performed according to the departmental dose-optimization program which includes automated exposure control, adjustment of the mA and/or kV according to patient size and/or use of iterative reconstruction technique. CONTRAST:  80mL OMNIPAQUE IOHEXOL 300 MG/ML  SOLN COMPARISON:  10/08/2020 FINDINGS: Lower chest: Included lung bases are clear.  Cardiomegaly. Hepatobiliary: Subcentimeter low-density lesion in the right hepatic lobe remains too small to characterize. No new focal liver abnormality. Unremarkable gallbladder. No hyperdense gallstone. No biliary dilatation. Pancreas: Unremarkable. No pancreatic ductal dilatation or  surrounding inflammatory changes. Spleen: Normal in size without focal abnormality. Adrenals/Urinary Tract: Unremarkable adrenal glands. Kidneys enhance symmetrically without focal lesion, stone, or hydronephrosis. Ureters are nondilated. Urinary bladder appears unremarkable for the degree of distention. Stomach/Bowel: Stomach is within normal limits. No dilated loops of bowel. Appendix not definitively visualized. No pericecal inflammatory changes to suggest appendicitis. Prominent colonic pneumatosis centered at the splenic flexure is unchanged in appearance and distribution compared to the previous CT from 2022 compatible with a benign etiology. No focal bowel wall thickening or inflammatory changes. Vascular/Lymphatic: Scattered aortoiliac atherosclerotic calcifications without aneurysm. No abdominopelvic lymphadenopathy. Reproductive: Multifibroid uterus.  No adnexal masses. Other: No free fluid. No abdominopelvic fluid collection. No pneumoperitoneum. No abdominal wall hernia. Musculoskeletal: No acute osseous abnormality. IMPRESSION: 1. No acute abdominopelvic findings. 2. Prominent colonic pneumatosis centered at the splenic flexure is unchanged in appearance and distribution compared to the previous CT from 2022 compatible with a benign etiology. 3. Multifibroid uterus. 4. Aortic atherosclerosis (ICD10-I70.0). Electronically Signed   By: Duanne Guess D.O.   On: 08/10/2022 12:37    Procedures Procedures    Medications Ordered in ED Medications  iohexol (OMNIPAQUE) 300 MG/ML solution 100 mL (80 mLs Intravenous Contrast Given 08/10/22 1206)    ED Course/ Medical Decision Making/ A&P                             Medical Decision Making Amount and/or Complexity of Data Reviewed Labs: ordered. Decision-making details documented in ED Course. Radiology: ordered and independent interpretation performed. Decision-making details documented in ED Course.  Risk Prescription drug  management.   Pt with multiple medical problems and comorbidities and presenting today with a complaint that caries a high risk for morbidity and mortality.  Patient here today with abdominal pain.  On exam patient is well-appearing and has very minimal pain with palpation.  Seems to be localized to the right side but no Murphy sign, significant right upper quadrant tenderness or hepatomegaly.  No obvious hernias present.  Patient is denying  any urinary symptoms.  Lower suspicion at this time for kidney stone, pyelonephritis, UTI, cholecystitis, hepatitis or pancreatitis.  Discussed exam and possibilities with the patient.  Did do shared decision making about watchful waiting with Tylenol as this could be something muscular as she does do a lot of lifting and helping with her husband versus blood work and imaging.  Patient would prefer to do the blood work and imaging now to ensure everything is okay.  Husband is present with her and feels that this would be the best path for her. I independently interpreted patient's labs and CBC, CMP, lipase, UA all within normal limits today. I have independently visualized and interpreted pt's images today.  CT abdomen pelvis without evidence of renal stones, obstruction or other acute findings today.  Radiology reports that patient does have prominent colonic pneumatosis at the splenic flexure which is unchanged from 2022 and a fibroid uterus.  Findings discussed with the patient.  At this time she appears well and stable for discharge.  She was given return precautions and encouraged to follow-up if symptoms do not improve.  Suspect possible muscular etiology.          Final Clinical Impression(s) / ED Diagnoses Final diagnoses:  Right lower quadrant abdominal pain    Rx / DC Orders ED Discharge Orders     None         Gwyneth Sprout, MD 08/10/22 1302

## 2022-08-11 ENCOUNTER — Ambulatory Visit: Payer: PPO | Admitting: Cardiovascular Disease

## 2022-08-11 ENCOUNTER — Telehealth: Payer: Self-pay

## 2022-08-11 NOTE — Transitions of Care (Post Inpatient/ED Visit) (Unsigned)
   08/11/2022  Name: [name] MRN: [id number] DOB: 05/03/1932  Today's TOC FU Call Status: Today's TOC FU Call Status:: Unsuccessul Call (1st Attempt) Unsuccessful Call (1st Attempt) Date: 08/11/22  Attempted to reach the patient regarding the most recent Inpatient/ED visit.  Follow Up Plan: Additional outreach attempts will be made to reach the patient to complete the Transitions of Care (Post Inpatient/ED visit) call.   Signature Karena Addison, LPN [facility] Nurse Health Advisor Direct Dial [phone]

## 2022-08-12 NOTE — Transitions of Care (Post Inpatient/ED Visit) (Unsigned)
   08/12/2022  Name: [name] MRN: [id number] DOB: July 23, 1932  Today's TOC FU Call Status: Today's TOC FU Call Status:: Unsuccessful Call (2nd Attempt) Unsuccessful Call (1st Attempt) Date: 08/11/22 Unsuccessful Call (2nd Attempt) Date: 08/12/22  Attempted to reach the patient regarding the most recent Inpatient/ED visit.  Follow Up Plan: Additional outreach attempts will be made to reach the patient to complete the Transitions of Care (Post Inpatient/ED visit) call.   Signature Karena Addison, LPN [facility] Nurse Health Advisor Direct Dial [phone]

## 2022-08-13 NOTE — Transitions of Care (Post Inpatient/ED Visit) (Signed)
   08/13/2022  Name: [name] MRN: [id number] DOB: 1932-09-25  Today's TOC FU Call Status: Today's TOC FU Call Status:: Unsuccessful Call (3rd Attempt) Unsuccessful Call (1st Attempt) Date: 08/11/22 Unsuccessful Call (2nd Attempt) Date: 08/12/22 Unsuccessful Call (3rd Attempt) Date: 08/13/22  Attempted to reach the patient regarding the most recent Inpatient/ED visit.  Follow Up Plan: No further outreach attempts will be made at this time. We have been unable to contact the patient.  Signature Karena Addison, LPN [facility] Nurse Health Advisor Direct Dial [phone]

## 2022-08-20 ENCOUNTER — Other Ambulatory Visit: Payer: Self-pay | Admitting: Internal Medicine

## 2022-08-20 NOTE — Patient Instructions (Addendum)
Blood work was ordered.   The lab is on the first floor.    Medications changes include :   none     Return in about 6 months (around 02/20/2023) for follow up.    Health Maintenance, Female Adopting a healthy lifestyle and getting preventive care are important in promoting health and wellness. Ask your health care provider about: The right schedule for you to have regular tests and exams. Things you can do on your own to prevent diseases and keep yourself healthy. What should I know about diet, weight, and exercise? Eat a healthy diet  Eat a diet that includes plenty of vegetables, fruits, low-fat dairy products, and lean protein. Do not eat a lot of foods that are high in solid fats, added sugars, or sodium. Maintain a healthy weight Body mass index (BMI) is used to identify weight problems. It estimates body fat based on height and weight. Your health care provider can help determine your BMI and help you achieve or maintain a healthy weight. Get regular exercise Get regular exercise. This is one of the most important things you can do for your health. Most adults should: Exercise for at least 150 minutes each week. The exercise should increase your heart rate and make you sweat (moderate-intensity exercise). Do strengthening exercises at least twice a week. This is in addition to the moderate-intensity exercise. Spend less time sitting. Even light physical activity can be beneficial. Watch cholesterol and blood lipids Have your blood tested for lipids and cholesterol at 89 years of age, then have this test every 5 years. Have your cholesterol levels checked more often if: Your lipid or cholesterol levels are high. You are older than 87 years of age. You are at high risk for heart disease. What should I know about cancer screening? Depending on your health history and family history, you may need to have cancer screening at various ages. This may include screening  for: Breast cancer. Cervical cancer. Colorectal cancer. Skin cancer. Lung cancer. What should I know about heart disease, diabetes, and high blood pressure? Blood pressure and heart disease High blood pressure causes heart disease and increases the risk of stroke. This is more likely to develop in people who have high blood pressure readings or are overweight. Have your blood pressure checked: Every 3-5 years if you are 50-69 years of age. Every year if you are 75 years old or older. Diabetes Have regular diabetes screenings. This checks your fasting blood sugar level. Have the screening done: Once every three years after age 68 if you are at a normal weight and have a low risk for diabetes. More often and at a younger age if you are overweight or have a high risk for diabetes. What should I know about preventing infection? Hepatitis B If you have a higher risk for hepatitis B, you should be screened for this virus. Talk with your health care provider to find out if you are at risk for hepatitis B infection. Hepatitis C Testing is recommended for: Everyone born from [birth year] through 1963. Anyone with known risk factors for hepatitis C. Sexually transmitted infections (STIs) Get screened for STIs, including gonorrhea and chlamydia, if: You are sexually active and are younger than 87 years of age. You are older than 87 years of age and your health care provider tells you that you are at risk for this type of infection. Your sexual activity has changed since you were last screened, and you are  at increased risk for chlamydia or gonorrhea. Ask your health care provider if you are at risk. Ask your health care provider about whether you are at high risk for HIV. Your health care provider may recommend a prescription medicine to help prevent HIV infection. If you choose to take medicine to prevent HIV, you should first get tested for HIV. You should then be tested every 3 months for as long as you  are taking the medicine. Pregnancy If you are about to stop having your period (premenopausal) and you may become pregnant, seek counseling before you get pregnant. Take 400 to 800 micrograms (mcg) of folic acid every day if you become pregnant. Ask for birth control (contraception) if you want to prevent pregnancy. Osteoporosis and menopause Osteoporosis is a disease in which the bones lose minerals and strength with aging. This can result in bone fractures. If you are 71 years old or older, or if you are at risk for osteoporosis and fractures, ask your health care provider if you should: Be screened for bone loss. Take a calcium or vitamin D supplement to lower your risk of fractures. Be given hormone replacement therapy (HRT) to treat symptoms of menopause. Follow these instructions at home: Alcohol use Do not drink alcohol if: Your health care provider tells you not to drink. You are pregnant, may be pregnant, or are planning to become pregnant. If you drink alcohol: Limit how much you have to: 0-1 drink a day. Know how much alcohol is in your drink. In the [place] one drink equals one 12 oz bottle of beer (355 mL), one 5 oz glass of wine (148 mL), or one 1 oz glass of hard liquor (44 mL). Lifestyle Do not use any products that contain nicotine or tobacco. These products include cigarettes, chewing tobacco, and vaping devices, such as e-cigarettes. If you need help quitting, ask your health care provider. Do not use street drugs. Do not share needles. Ask your health care provider for help if you need support or information about quitting drugs. General instructions Schedule regular health, dental, and eye exams. Stay current with your vaccines. Tell your health care provider if: You often feel depressed. You have ever been abused or do not feel safe at home. Summary Adopting a healthy lifestyle and getting preventive care are important in promoting health and wellness. Follow your  health care provider's instructions about healthy diet, exercising, and getting tested or screened for diseases. Follow your health care provider's instructions on monitoring your cholesterol and blood pressure. This information is not intended to replace advice given to you by your health care provider. Make sure you discuss any questions you have with your health care provider. Document Revised: 09/03/2020 Document Reviewed: 09/03/2020 Elsevier Patient Education  [place].

## 2022-08-20 NOTE — Progress Notes (Unsigned)
Subjective:    Patient ID: [name], female    DOB: 09/02/1932, 89 y.o.   MRN: [id number]      HPI [name] is here for a Physical exam and her chronic medical problems.   on metoprolol - was not on her list, but she is taking it.   She can not walk the way she used to - she gets SOB with elevation.  She thinks this is related to the afib.  Her energy level is a little less - wonders if that is from the afib     Medications and allergies reviewed with patient and updated if appropriate.  Current Outpatient Medications on File Prior to Visit  Medication Sig Dispense Refill   acetaminophen (TYLENOL) 500 MG tablet Take 500-1,000 mg by mouth every 6 (six) hours as needed for mild pain or moderate pain.      alendronate (FOSAMAX) 70 MG tablet Take 1 tablet (70 mg total) by mouth every 7 (seven) days. Take with a full glass of water on an empty stomach. 12 tablet 3   ALPRAZolam (XANAX) 0.5 MG tablet TAKE 1 TABLET TWICE DAILY AS NEEDED FOR AN ANXIETY, DO NOT TAKE AT NIGHT. 180 tablet 0   apixaban (ELIQUIS) 5 MG TABS tablet Take 1 tablet (5 mg total) by mouth 2 (two) times daily. 180 tablet 2   Azelastine HCl 0.15 % SOLN 2 sprays twice a day     Azelastine-Fluticasone (DYMISTA) 137-50 MCG/ACT SUSP 1 spray each nostril 1-2 times per day 23 g 1   Calcium Carb-Cholecalciferol 600-400 MG-UNIT TABS Take 1 tablet by mouth daily.     Chloral Hydrate CRYS Take 10 mls by mouth at bedtime as needed 900 g 5   Cholecalciferol (VITAMIN D-3) 1000 UNITS CAPS Take 1,000 Units by mouth daily.      clindamycin (CLEOCIN) 300 MG capsule Per patient taking prior to dental appointment     desonide (DESOWEN) 0.05 % cream      ferrous sulfate 325 (65 FE) MG tablet Take 325 mg by mouth 2 (two) times a week. Every Monday and Wednesday     furosemide (LASIX) 20 MG tablet Take 1 tablet by mouth daily. If greater than 3 pound gain in 24 hours, increase to 2 tablets daily until weight returns to normal 90  tablet 3   ipratropium (ATROVENT) 0.06 % nasal spray 2 sprays each nostril every 6 hours to dry nose 15 mL 5   levocetirizine (XYZAL) 5 MG tablet Take 1 tablet (5 mg total) by mouth every evening. 90 tablet 1   Magnesium Oxide 250 MG TABS Take by mouth.     nitroGLYCERIN (NITROSTAT) 0.4 MG SL tablet Place 1 tablet (0.4 mg total) under the tongue every 5 (five) minutes as needed for chest pain (CP or SOB). 25 tablet 3   pantoprazole (PROTONIX) 40 MG tablet TAKE 1 TABLET BY MOUTH TWICE DAILY. 180 tablet 3   Polyethyl Glycol-Propyl Glycol (SYSTANE ULTRA) 0.4-0.3 % SOLN Place 1 drop into both eyes daily as needed (dry eyes).     polyethylene glycol (MIRALAX / GLYCOLAX) 17 g packet Take 17 g by mouth daily.     potassium chloride (KLOR-CON) 10 MEQ tablet Take 1 tablet (10 mEq total) by mouth daily. 90 tablet 2   rosuvastatin (CRESTOR) 40 MG tablet Take 1 tablet (40 mg total) by mouth daily. 90 tablet 3   RYALTRIS 665-25 MCG/ACT SUSP 2 sprays per nostril 1-2 times daily 29 g 5  tretinoin (RETIN-A) 0.05 % cream Apply topically.     No current facility-administered medications on file prior to visit.    Review of Systems  Constitutional:  Negative for fever.  Eyes:  Negative for visual disturbance.  Respiratory:  Positive for shortness of breath (with walking an incline). Negative for cough and wheezing.   Cardiovascular:  Positive for leg swelling (intermittent). Negative for chest pain and palpitations.  Gastrointestinal:  Positive for constipation. Negative for abdominal pain, blood in stool and nausea.       No gerd  Genitourinary:  Negative for dysuria.  Musculoskeletal:  Negative for back pain.       Pain in left leg  Skin:  Negative for rash.  Neurological:  Positive for light-headedness (little) and headaches. Negative for dizziness.  Psychiatric/Behavioral:  Negative for dysphoric mood. The patient is nervous/anxious.        Objective:   Vitals:   08/21/22 1010  BP: 118/78   Pulse: 77  Temp: 98.1 F (36.7 C)  SpO2: 98%   Filed Weights   08/21/22 1010  Weight: 160 lb (72.6 kg)   Body mass index is 27.32 kg/m.  BP Readings from Last 3 Encounters:  08/21/22 118/78  08/10/22 (!) 137/94  06/24/22 110/60    Wt Readings from Last 3 Encounters:  08/21/22 160 lb (72.6 kg)  08/10/22 160 lb 7.9 oz (72.8 kg)  06/24/22 160 lb 6.4 oz (72.8 kg)       Physical Exam Constitutional: She appears well-developed and well-nourished. No distress.  HENT:  Head: Normocephalic and atraumatic.  Right Ear: External ear normal. Normal ear canal and TM Left Ear: External ear normal.  Normal ear canal and TM Mouth/Throat: Oropharynx is clear and moist.  Eyes: Conjunctivae normal.  Neck: Neck supple. No tracheal deviation present. No thyromegaly present.  No carotid bruit  Cardiovascular: Normal rate, regular rhythm and normal heart sounds.   No murmur heard.  No edema. Pulmonary/Chest: Effort normal and breath sounds normal. No respiratory distress. She has no wheezes. She has no rales.  Breast: deferred   Abdominal: Soft. She exhibits no distension. There is no tenderness.  Lymphadenopathy: She has no cervical adenopathy.  Skin: Skin is warm and dry. She is not diaphoretic.  Psychiatric: She has a normal mood and affect. Her behavior is normal.     Lab Results  Component Value Date   WBC 7.1 08/10/2022   HGB 13.5 08/10/2022   HCT 41.2 08/10/2022   PLT 120 (L) 08/10/2022   GLUCOSE 117 (H) 08/10/2022   CHOL 189 02/18/2022   TRIG 67.0 02/18/2022   HDL 93.40 02/18/2022   LDLDIRECT 144.0 11/29/2012   LDLCALC 82 02/18/2022   ALT 30 08/10/2022   AST 33 08/10/2022   NA 138 08/10/2022   K 4.2 08/10/2022   CL 107 08/10/2022   CREATININE 0.84 08/10/2022   BUN 19 08/10/2022   CO2 22 08/10/2022   TSH 2.370 01/17/2021   INR 1.0 03/06/2020   HGBA1C 6.1 02/18/2022         Assessment & Plan:   Physical exam: Screening blood work  ordered Exercise   active, not regular exercise - stressed walking again for exercise Weight  ok for age Substance abuse  none   Reviewed recommended immunizations.   Health Maintenance  Topic Date Due   COVID-19 Vaccine (4 - 2023-24 season) 12/27/2021   Medicare Annual Wellness (AWV)  11/07/2022   INFLUENZA VACCINE  11/27/2022   MAMMOGRAM  06/25/2023  DEXA SCAN  08/29/2023   DTaP/Tdap/Td (3 - Td or Tdap) 09/30/2026   Pneumonia Vaccine 44+ Years old  Completed   Zoster Vaccines- Shingrix  Completed   HPV VACCINES  Aged Out   COLONOSCOPY (Pts 45-50yrs Insurance coverage will need to be confirmed)  Discontinued          See Problem List for Assessment and Plan of chronic medical problems.

## 2022-08-21 ENCOUNTER — Encounter: Payer: Self-pay | Admitting: Internal Medicine

## 2022-08-21 ENCOUNTER — Ambulatory Visit (INDEPENDENT_AMBULATORY_CARE_PROVIDER_SITE_OTHER): Payer: PPO | Admitting: Internal Medicine

## 2022-08-21 VITALS — BP 118/78 | HR 77 | Temp 98.1°F | Ht 64.17 in | Wt 160.0 lb

## 2022-08-21 DIAGNOSIS — R7303 Prediabetes: Secondary | ICD-10-CM | POA: Diagnosis not present

## 2022-08-21 DIAGNOSIS — I2510 Atherosclerotic heart disease of native coronary artery without angina pectoris: Secondary | ICD-10-CM

## 2022-08-21 DIAGNOSIS — F5101 Primary insomnia: Secondary | ICD-10-CM

## 2022-08-21 DIAGNOSIS — I5032 Chronic diastolic (congestive) heart failure: Secondary | ICD-10-CM

## 2022-08-21 DIAGNOSIS — Z0000 Encounter for general adult medical examination without abnormal findings: Secondary | ICD-10-CM | POA: Diagnosis not present

## 2022-08-21 DIAGNOSIS — E782 Mixed hyperlipidemia: Secondary | ICD-10-CM

## 2022-08-21 DIAGNOSIS — M810 Age-related osteoporosis without current pathological fracture: Secondary | ICD-10-CM

## 2022-08-21 DIAGNOSIS — F419 Anxiety disorder, unspecified: Secondary | ICD-10-CM

## 2022-08-21 DIAGNOSIS — I10 Essential (primary) hypertension: Secondary | ICD-10-CM

## 2022-08-21 DIAGNOSIS — T8203XD Leakage of heart valve prosthesis, subsequent encounter: Secondary | ICD-10-CM

## 2022-08-21 DIAGNOSIS — E538 Deficiency of other specified B group vitamins: Secondary | ICD-10-CM

## 2022-08-21 DIAGNOSIS — I4821 Permanent atrial fibrillation: Secondary | ICD-10-CM

## 2022-08-21 DIAGNOSIS — D6869 Other thrombophilia: Secondary | ICD-10-CM

## 2022-08-21 DIAGNOSIS — D696 Thrombocytopenia, unspecified: Secondary | ICD-10-CM

## 2022-08-21 MED ORDER — METOPROLOL SUCCINATE ER 25 MG PO TB24: 25.0000 mg | ORAL_TABLET | Freq: Every day | ORAL | 3 refills | Status: DC

## 2022-08-21 NOTE — Assessment & Plan Note (Signed)
Chronic Following with cardiology No symptoms consistent with angina Continue current medications 

## 2022-08-21 NOTE — Assessment & Plan Note (Signed)
Chronic ?Controlled, Stable ?Continue chloral hydrate 10 mL nightly ?

## 2022-08-21 NOTE — Assessment & Plan Note (Signed)
Chronic Mild, stable 

## 2022-08-21 NOTE — Assessment & Plan Note (Signed)
Chronic DEXA up-to-date Continue Fosamax 70 mg weekly-started 08/2021 Stressed regular exercise Continue calcium and vitamin D supplementation Check vitamin D level 

## 2022-08-21 NOTE — Assessment & Plan Note (Signed)
Chronic Check a1c Low sugar / carb diet Stressed regular exercise  

## 2022-08-21 NOTE — Assessment & Plan Note (Signed)
Chronic Blood pressure is well-controlled On metoprolol XL 25 mg daily 

## 2022-08-21 NOTE — Assessment & Plan Note (Signed)
Chronic secondary to atrial fibrillation Continue Eliquis 5 mg twice daily

## 2022-08-21 NOTE — Assessment & Plan Note (Addendum)
Chronic Euvolemic Following with cardiology Weighing herself daily and weight has been stable - occ takes extra dose if weight goes up On Lasix 20 mg daily

## 2022-08-21 NOTE — Assessment & Plan Note (Addendum)
Chronic Continue B12 supplementation B12 level

## 2022-08-21 NOTE — Assessment & Plan Note (Signed)
Chronic Controlled, Stable Continue alprazolam 0.5 mg twice daily as needed 

## 2022-08-21 NOTE — Assessment & Plan Note (Addendum)
Chronic Following with cardiology On Eliquis 5 mg twice daily, metoprolol xl 25 mg daily Euvolemic tsh

## 2022-08-21 NOTE — Assessment & Plan Note (Signed)
Chronic Regular exercise and healthy diet encouraged Check lipid panel  Continue Crestor 40 mg daily 

## 2022-08-27 ENCOUNTER — Other Ambulatory Visit (INDEPENDENT_AMBULATORY_CARE_PROVIDER_SITE_OTHER): Payer: PPO

## 2022-08-27 ENCOUNTER — Encounter: Payer: Self-pay | Admitting: Internal Medicine

## 2022-08-27 DIAGNOSIS — M810 Age-related osteoporosis without current pathological fracture: Secondary | ICD-10-CM

## 2022-08-27 DIAGNOSIS — E782 Mixed hyperlipidemia: Secondary | ICD-10-CM

## 2022-08-27 DIAGNOSIS — R7303 Prediabetes: Secondary | ICD-10-CM

## 2022-08-27 DIAGNOSIS — I4821 Permanent atrial fibrillation: Secondary | ICD-10-CM

## 2022-08-27 DIAGNOSIS — E538 Deficiency of other specified B group vitamins: Secondary | ICD-10-CM | POA: Diagnosis not present

## 2022-08-27 LAB — VITAMIN D 25 HYDROXY (VIT D DEFICIENCY, FRACTURES): VITD: 55.84 ng/mL (ref 30.00–100.00)

## 2022-08-27 LAB — LIPID PANEL
Cholesterol: 152 mg/dL (ref 0–200)
HDL: 70.8 mg/dL (ref >39.00)
LDL Cholesterol: 68 mg/dL (ref 0–99)
NonHDL: 81.57
Total CHOL/HDL Ratio: 2
Triglycerides: 69 mg/dL (ref 0.0–149.0)
VLDL: 13.8 mg/dL (ref 0.0–40.0)

## 2022-08-27 LAB — HEMOGLOBIN A1C: Hgb A1c MFr Bld: 6.2 % (ref 4.6–6.5)

## 2022-08-27 LAB — VITAMIN B12: Vitamin B-12: 417 pg/mL (ref 211–911)

## 2022-08-27 LAB — TSH: TSH: 1 u[IU]/mL (ref 0.35–5.50)

## 2022-08-28 ENCOUNTER — Encounter: Payer: Self-pay | Admitting: Cardiovascular Disease

## 2022-08-28 ENCOUNTER — Other Ambulatory Visit: Payer: Self-pay | Admitting: Cardiovascular Disease

## 2022-09-04 ENCOUNTER — Encounter: Payer: Self-pay | Admitting: Internal Medicine

## 2022-09-04 ENCOUNTER — Telehealth: Payer: Self-pay | Admitting: *Deleted

## 2022-09-04 MED ORDER — ROSUVASTATIN CALCIUM 40 MG PO TABS: 20.0000 mg | ORAL_TABLET | Freq: Every day | ORAL | 3 refills | Status: DC

## 2022-09-04 NOTE — Telephone Encounter (Signed)
CALLED PATIENT TO INFORM OF CT FOR 11-28-22- ARRIVAL TIME- 11 AM @ [facility], NO RESTRICTIONS TO TEST, PATIENT TO BE CALLED BY ALISON PERKINS ON 12-01-22 @ 2:30 PM WITH RESULTS, SPOKE WITH PATIENT AND SHE IS AWARE OF THESE APPTS. AND THE INSTRUCTIONS.

## 2022-09-16 ENCOUNTER — Ambulatory Visit (HOSPITAL_COMMUNITY): Payer: PPO | Attending: Cardiology

## 2022-09-16 ENCOUNTER — Ambulatory Visit: Payer: PPO | Admitting: Allergy and Immunology

## 2022-09-16 DIAGNOSIS — I4821 Permanent atrial fibrillation: Secondary | ICD-10-CM | POA: Insufficient documentation

## 2022-09-16 DIAGNOSIS — I2510 Atherosclerotic heart disease of native coronary artery without angina pectoris: Secondary | ICD-10-CM | POA: Insufficient documentation

## 2022-09-16 DIAGNOSIS — I5032 Chronic diastolic (congestive) heart failure: Secondary | ICD-10-CM

## 2022-09-16 DIAGNOSIS — T8203XD Leakage of heart valve prosthesis, subsequent encounter: Secondary | ICD-10-CM | POA: Diagnosis present

## 2022-09-16 LAB — ECHOCARDIOGRAM COMPLETE
AR max vel: 0.71 cm2
AV Area VTI: 0.77 cm2
AV Area mean vel: 0.79 cm2
AV Mean grad: 7.8 mmHg
AV Peak grad: 15.4 mmHg
Ao pk vel: 1.96 m/s
Area-P 1/2: 3.84 cm2
P 1/2 time: 395 msec
S' Lateral: 2.7 cm

## 2022-09-19 ENCOUNTER — Ambulatory Visit: Payer: PPO | Attending: Cardiovascular Disease | Admitting: Cardiovascular Disease

## 2022-09-19 ENCOUNTER — Encounter: Payer: Self-pay | Admitting: Cardiovascular Disease

## 2022-09-19 VITALS — BP 102/70 | HR 88 | Ht 65.0 in | Wt 161.0 lb

## 2022-09-19 DIAGNOSIS — I5032 Chronic diastolic (congestive) heart failure: Secondary | ICD-10-CM

## 2022-09-19 DIAGNOSIS — T8203XS Leakage of heart valve prosthesis, sequela: Secondary | ICD-10-CM

## 2022-09-19 DIAGNOSIS — T8203XD Leakage of heart valve prosthesis, subsequent encounter: Secondary | ICD-10-CM

## 2022-09-19 DIAGNOSIS — I4819 Other persistent atrial fibrillation: Secondary | ICD-10-CM | POA: Diagnosis not present

## 2022-09-19 DIAGNOSIS — Z952 Presence of prosthetic heart valve: Secondary | ICD-10-CM

## 2022-09-19 MED ORDER — EMPAGLIFLOZIN 10 MG PO TABS: 10.0000 mg | ORAL_TABLET | Freq: Every day | ORAL | 6 refills | Status: DC

## 2022-09-19 MED ORDER — EMPAGLIFLOZIN 10 MG PO TABS: 10.0000 mg | ORAL_TABLET | Freq: Every day | ORAL | 0 refills | Status: DC

## 2022-09-19 NOTE — Patient Instructions (Signed)
Medication Instructions:  Your physician has recommended you make the following change in your medication:  1-START Jardiance 10 mg by mouth daily.  *If you need a refill on your cardiac medications before your next appointment, please call your pharmacy*   Lab Work: Your physician recommends that you return for lab work in: 2 weeks for BMET  If you have labs (blood work) drawn today and your tests are completely normal, you will receive your results only by: MyChart Message (if you have MyChart) OR A paper copy in the mail If you have any lab test that is abnormal or we need to change your treatment, we will call you to review the results.  Follow-Up: At [facility], you and your health needs are our priority.  As part of our continuing mission to provide you with exceptional heart care, we have created designated Provider Care Teams.  These Care Teams include your primary Cardiologist (physician) and Advanced Practice Providers (APPs -  Physician Assistants and Nurse Practitioners) who all work together to provide you with the care you need, when you need it.  We recommend signing up for the patient portal called "MyChart".  Sign up information is provided on this After Visit Summary.  MyChart is used to connect with patients for Virtual Visits (Telemedicine).  Patients are able to view lab/test results, encounter notes, upcoming appointments, etc.  Non-urgent messages can be sent to your provider as well.   To learn more about what you can do with MyChart, go to [email or web address].    Your next appointment:   6 month(s)  Provider:   Tonny Bollman, MD     Other Instructions You have been referred to Pharmacist Clinic in 3 weeks.

## 2022-09-19 NOTE — Progress Notes (Signed)
Cardiology Office Note:    Date:  09/19/2022   ID:  [name], DOB 04/20/1933, MRN [id number]  PCP:  Pincus Sanes, MD   [facility] Providers Cardiologist:  Pamela Bollman, MD     Referring MD: Pincus Sanes, MD   Chief Complaint  Patient presents with   Follow-up    Afib   Atrial Fibrillation    History of Present Illness:    [name] is a 89 y.o. female presenting for follow-up evaluation.  The patient is here alone today.  Her daughter, [name], is conferenced in over the telephone.  Her cardiovascular problem list is outlined below:  Persistent atrial fibrillation  S/p TEE-DCCV 11/2019 CHA2DS2-VASc=6 (age x 2, female, CAD, CHF, HTN) >> Apixaban   Failed cardioversion with amiodarone in 2022, now with persistent atrial fibrillation Bicuspid valve, aortic stenosis S/p bioprosthetic AVR in 2016 Paravalvular leak Normal coronary arteries prior to AVR Coronary artery disease S/p NSTEMI in 01/2019 >> total occlusion of distal LAD felt to be prob embolic event Ischemic CM, EF 45-50 w ant-apical AK TEE 8/21: EF 55 Breast CA Hypertension  Hyperlipidemia  Diastolic CHF   The patient reports that she has been retaining more fluid lately.  She has had to take extra furosemide more frequently.  She has noted more episodes of weight gain and has to control this with increased Lasix.  She has not changed her diet or sodium intake.  She denies chest pain or pressure.  She is short of breath with activity, but able to walk on level ground without trouble.  She denies orthopnea or PND.  Reports that her appetite has been diminished.   Past Medical History:  Diagnosis Date   Allergy    Anemia    Anxiety    Aortic valve stenosis, severe    s/p AVR // mild to mod paravalvular leakage // Echo 9/21: EF 55-60, no RWMA, mild LVH, GR 2 DD, mildly reduced RVSF, RVSP 38.7, moderate LAE, mild RAE, AVR with mean gradient 11 mmHg, mild paravalvular leakage    Arthritis    Breast cancer (HCC) 2007   right   Cataract    Constipation    Dysrhythmia    A-fib   Hiatal hernia    pt denies   Hypercholesteremia    Hypertension    Hypoglycemia    Osteopenia    Personal history of radiation therapy    Tingling    Ulcer 2012   bleeding gastric Ulcer    Past Surgical History:  Procedure Laterality Date   AORTIC VALVE REPLACEMENT N/A 11/16/2014   Procedure: AORTIC VALVE REPLACEMENT (AVR);  Surgeon: Alleen Borne, MD;  Location: [facility] OR;  Service: Open Heart Surgery;  Laterality: N/A;   BIOPSY  12/15/2017   Procedure: BIOPSY;  Surgeon: Meridee Score Netty Starring., MD;  Location: [facility] ENDOSCOPY;  Service: Gastroenterology;;   BIOPSY  01/10/2021   Procedure: BIOPSY;  Surgeon: Lemar Lofty., MD;  Location: [facility] ENDOSCOPY;  Service: Gastroenterology;;   BREAST BIOPSY     BREAST LUMPECTOMY Right    2005?   BREAST SURGERY     BRONCHIAL BIOPSY  03/06/2020   Procedure: BRONCHIAL BIOPSIES;  Surgeon: Leslye Peer, MD;  Location: [facility] ENDOSCOPY;  Service: Pulmonary;;   BRONCHIAL BRUSHINGS  03/06/2020   Procedure: BRONCHIAL BRUSHINGS;  Surgeon: Leslye Peer, MD;  Location: [facility] ENDOSCOPY;  Service: Pulmonary;;   BRONCHIAL NEEDLE ASPIRATION BIOPSY  03/06/2020   Procedure: BRONCHIAL NEEDLE ASPIRATION BIOPSIES;  Surgeon: Leslye Peer, MD;  Location: [facility] ENDOSCOPY;  Service: Pulmonary;;   BRONCHIAL WASHINGS  03/06/2020   Procedure: BRONCHIAL WASHINGS;  Surgeon: Leslye Peer, MD;  Location: [facility] ENDOSCOPY;  Service: Pulmonary;;   CARDIAC CATHETERIZATION N/A 10/18/2014   Procedure: Right/Left Heart Cath and Coronary Angiography;  Surgeon: Pamela Bollman, MD;  Location: [facility] INVASIVE CV LAB;  Service: Cardiovascular;  Laterality: N/A;   CARDIOVERSION N/A 12/09/2019   Procedure: CARDIOVERSION;  Surgeon: Wendall Stade, MD;  Location: [facility] ENDOSCOPY;  Service: Cardiovascular;  Laterality: N/A;   CARDIOVERSION N/A 12/30/2019   Procedure: CARDIOVERSION;  Surgeon: Wendall Stade, MD;  Location: [facility] ENDOSCOPY;  Service: Cardiovascular;  Laterality: N/A;   COLONOSCOPY WITH PROPOFOL N/A 01/10/2021   Procedure: COLONOSCOPY WITH PROPOFOL;  Surgeon: Lemar Lofty., MD;  Location: [facility] ENDOSCOPY;  Service: Gastroenterology;  Laterality: N/A;   CORONARY ANGIOGRAPHY N/A 02/04/2019   Procedure: CORONARY ANGIOGRAPHY;  Surgeon: Yvonne Kendall, MD;  Location: [facility] INVASIVE CV LAB;  Service: Cardiovascular;  Laterality: N/A;   COSMETIC SURGERY     face   ESOPHAGOGASTRODUODENOSCOPY (EGD) WITH PROPOFOL N/A 12/15/2017   Procedure: ESOPHAGOGASTRODUODENOSCOPY (EGD) WITH PROPOFOL;  Surgeon: Meridee Score Netty Starring., MD;  Location: [facility] ENDOSCOPY;  Service: Gastroenterology;  Laterality: N/A;   FIDUCIAL MARKER PLACEMENT  03/06/2020   Procedure: FIDUCIAL MARKER PLACEMENT;  Surgeon: Leslye Peer, MD;  Location: [facility] ENDOSCOPY;  Service: Pulmonary;;   PARATHYROIDECTOMY     TEE WITHOUT CARDIOVERSION N/A 11/16/2014   Procedure: TRANSESOPHAGEAL ECHOCARDIOGRAM (TEE);  Surgeon: Alleen Borne, MD;  Location: [facility] OR;  Service: Open Heart Surgery;  Laterality: N/A;   TEE WITHOUT CARDIOVERSION N/A 12/09/2019   Procedure: TRANSESOPHAGEAL ECHOCARDIOGRAM (TEE);  Surgeon: Wendall Stade, MD;  Location: [facility] ENDOSCOPY;  Service: Cardiovascular;  Laterality: N/A;   TONSILLECTOMY     VIDEO BRONCHOSCOPY WITH ENDOBRONCHIAL NAVIGATION Right 03/06/2020   Procedure: VIDEO BRONCHOSCOPY WITH ENDOBRONCHIAL NAVIGATION;  Surgeon: Leslye Peer, MD;  Location: [facility] ENDOSCOPY;  Service: Pulmonary;  Laterality: Right;    Current Medications: Current Meds  Medication Sig   acetaminophen (TYLENOL) 500 MG tablet Take 500-1,000 mg by mouth every 6 (six) hours as needed for mild pain or moderate pain.    alendronate (FOSAMAX) 70 MG tablet Take 1 tablet (70 mg total) by mouth every 7 (seven) days. Take with a full glass of water on an empty stomach.   ALPRAZolam (XANAX) 0.5 MG tablet TAKE 1 TABLET TWICE DAILY AS NEEDED  FOR AN ANXIETY, DO NOT TAKE AT NIGHT.   apixaban (ELIQUIS) 5 MG TABS tablet Take 1 tablet (5 mg total) by mouth 2 (two) times daily.   Azelastine HCl 0.15 % SOLN 2 sprays twice a day   Azelastine-Fluticasone (DYMISTA) 137-50 MCG/ACT SUSP 1 spray each nostril 1-2 times per day   Calcium Carb-Cholecalciferol 600-400 MG-UNIT TABS Take 1 tablet by mouth daily.   Chloral Hydrate CRYS Take 10 mls by mouth at bedtime as needed   Cholecalciferol (VITAMIN D-3) 1000 UNITS CAPS Take 1,000 Units by mouth daily.    clindamycin (CLEOCIN) 300 MG capsule Per patient taking prior to dental appointment   desonide (DESOWEN) 0.05 % cream    empagliflozin (JARDIANCE) 10 MG TABS tablet Take 1 tablet (10 mg total) by mouth daily before breakfast.   empagliflozin (JARDIANCE) 10 MG TABS tablet Take 1 tablet (10 mg total) by mouth daily.   ferrous sulfate 325 (65 FE) MG tablet Take 325 mg by mouth 2 (two) times a week. Every  Monday and Wednesday   furosemide (LASIX) 20 MG tablet Take 1 tablet by mouth daily. If greater than 3 pound gain in 24 hours, increase to 2 tablets daily until weight returns to normal   ipratropium (ATROVENT) 0.06 % nasal spray 2 sprays each nostril every 6 hours to dry nose   levocetirizine (XYZAL) 5 MG tablet Take 1 tablet (5 mg total) by mouth every evening.   Magnesium Oxide 250 MG TABS Take by mouth.   metoprolol succinate (TOPROL XL) 25 MG 24 hr tablet Take 1 tablet (25 mg total) by mouth daily.   nitroGLYCERIN (NITROSTAT) 0.4 MG SL tablet Place 1 tablet (0.4 mg total) under the tongue every 5 (five) minutes as needed for chest pain (CP or SOB).   pantoprazole (PROTONIX) 40 MG tablet TAKE 1 TABLET BY MOUTH TWICE DAILY.   Polyethyl Glycol-Propyl Glycol (SYSTANE ULTRA) 0.4-0.3 % SOLN Place 1 drop into both eyes daily as needed (dry eyes).   polyethylene glycol (MIRALAX / GLYCOLAX) 17 g packet Take 17 g by mouth daily.   potassium chloride (KLOR-CON) 10 MEQ tablet Take 1 tablet (10 mEq total) by  mouth daily.   rosuvastatin (CRESTOR) 40 MG tablet Take 0.5 tablets (20 mg total) by mouth daily.   RYALTRIS [id number] MCG/ACT SUSP 2 sprays per nostril 1-2 times daily   tretinoin (RETIN-A) 0.05 % cream Apply topically.     Allergies:   Covid-19 (mrna) vaccine, Lactose intolerance (gi), Amoxicillin-pot clavulanate, Amoxicillin-pot clavulanate, Penicillin g benzathine, Penicillins, Sulfa antibiotics, and Sulfonamide derivatives   Social History   Socioeconomic History   Marital status: Married    Spouse name: Not on file   Number of children: 3   Years of education: 16   Highest education level: Bachelor's degree (e.g., BA, AB, BS)  Occupational History   Occupation: Retired  Tobacco Use   Smoking status: Former    Packs/day: 0.50    Years: 15.00    Additional pack years: 0.00    Total pack years: 7.50    Types: Cigarettes    Start date: 30    Quit date: 1986    Years since quitting: 38.4   Smokeless tobacco: Never   Tobacco comments:    quit smoking 1980  Vaping Use   Vaping Use: Never used  Substance and Sexual Activity   Alcohol use: Yes    Alcohol/week: 7.0 standard drinks of alcohol    Types: 7 Glasses of wine per week    Comment: wine nightly with dinner   Drug use: No   Sexual activity: Not Currently  Other Topics Concern   Not on file  Social History Narrative   ** Merged History Encounter **       Lives at [facility] with her husband. Right-handed. Caffeine use: 3-4 cups per day (some tea, mixes coffee with decaf).   Social Determinants of Health   Financial Resource Strain: Low Risk  (11/06/2021)   Overall Financial Resource Strain (CARDIA)    Difficulty of Paying Living Expenses: Not hard at all  Food Insecurity: No Food Insecurity (11/06/2021)   Hunger Vital Sign    Worried About Running Out of Food in the Last Year: Never true    Ran Out of Food in the Last Year: Never true  Transportation Needs: No Transportation Needs (11/06/2021)   PRAPARE -  Administrator, Civil Service (Medical): No    Lack of Transportation (Non-Medical): No  Physical Activity: Sufficiently Active (11/23/2018)   Exercise Vital  Sign    Days of Exercise per Week: 6 days    Minutes of Exercise per Session: 30 min  Stress: No Stress Concern Present (11/06/2021)   [employer] of Occupational Health - Occupational Stress Questionnaire    Feeling of Stress : Not at all  Social Connections: Socially Integrated (11/06/2021)   Social Connection and Isolation Panel [NHANES]    Frequency of Communication with Friends and Family: More than three times a week    Frequency of Social Gatherings with Friends and Family: More than three times a week    Attends Religious Services: 1 to 4 times per year    Active Member of [employer] or Organizations: Yes    Attends Engineer, structural: Not on file    Marital Status: Married     Family History: The patient's family history includes Cancer (age of onset: 35) in her cousin; Colon cancer in her mother; Hypertension in her mother; Kidney disease in her father; Rectal cancer in her mother. There is no history of Esophageal cancer, Stomach cancer, or Colon polyps.  ROS:   Please see the history of present illness.    All other systems reviewed and are negative.  EKGs/Labs/Other Studies Reviewed:    The following studies were reviewed today: Cardiac Studies & Procedures   CARDIAC CATHETERIZATION  CARDIAC CATHETERIZATION 02/04/2019  Narrative Conclusions: 1. Severe single-vessel coronary artery disease with occlusion of the distal LAD.  Otherwise, there is mild disease involving the proximal LAD and ostial diagonal branch.  Question if this represents acute plaque rupture versus coronary embolism. 2. No significant CAD involving the LCx and RCA.  Recommendations: 1. Medical therapy including dual antiplatelet therapy with aspirin and clopidogrel for up to 12 months.  Distal LAD at site of occlusion is  very small and not well-suited for stent placement. 2. Aggressive secondary prevention. 3. Follow-up echocardiogram with particular attention to aortic valve prosthesis to exclude thrombus.  Yvonne Kendall, MD [facility] Pager: [phone]  Findings Coronary Findings Diagnostic  Dominance: Right  Left Main Vessel is large. Vessel is angiographically normal.  Left Anterior Descending Vessel is large. Mid LAD lesion is 15% stenosed. The lesion is eccentric. Dist LAD lesion is 100% stenosed. The lesion is thrombotic.  First Diagonal Branch Vessel is large in size. 1st Diag lesion is 20% stenosed.  Left Circumflex Vessel is large. Vessel is angiographically normal.  First Obtuse Marginal Branch Vessel is small in size.  Second Obtuse Marginal Branch Vessel is large in size.  Third Obtuse Marginal Branch Vessel is small in size.  Right Coronary Artery Vessel is angiographically normal.  Intervention  No interventions have been documented.   CARDIAC CATHETERIZATION  CARDIAC CATHETERIZATION 10/18/2014  Narrative Final conclusions: 1. Widely patent coronary arteries without evidence of obstructive coronary artery disease 2. Bulky calcification of the aortic valve with restricted mobility based on fluoroscopic assessment 3. Essentially normal right heart pressures and normal cardiac output  Plan: refer to Dr Laneta Simmers for consideration of aortic valve replacement  Findings Coronary Findings Diagnostic  Dominance: Right  Left Main The vessel is angiographically normal.  Left Anterior Descending The vessel is angiographically normal.  Second Diagonal Branch The vessel is large in size.  Left Circumflex The vessel is angiographically normal.  Right Coronary Artery The vessel is angiographically normal.  Intervention  No interventions have been documented.     ECHOCARDIOGRAM  ECHOCARDIOGRAM COMPLETE 09/16/2022  Narrative ECHOCARDIOGRAM  REPORT    Patient Name:   [name]  Schwarting Date of Exam: 09/16/2022 Medical Rec #:  [id number]         Height:       64.2 in Accession #:    [id number]        Weight:       160.0 lb Date of Birth:  01-09-1933        BSA:          1.783 m Patient Age:    89 years          BP:           125/87 mmHg Patient Gender: F                 HR:           87 bpm. Exam Location:  [address]  Procedure: 2D Echo, Cardiac Doppler, Color Doppler and Strain Analysis  Indications:    I48.2 Atrial Fibrillation  History:        Patient has prior history of Echocardiogram examinations, most recent 06/07/2021. CHF, NSTEMI and CAD, Breast cancer; Risk Factors:Hypertension and HLD. Aortic Valve: 21 mm Magna Ease bioprosthetic valve is present in the aortic position. Procedure Date: 2016.  Sonographer:    Clearence Ped RCS Referring Phys: [phone] Latresha Yahr  IMPRESSIONS   1. Left ventricular ejection fraction, by estimation, is 60 to 65%. The left ventricle has normal function. The left ventricle has no regional wall motion abnormalities. Left ventricular diastolic parameters are indeterminate. The average left ventricular global longitudinal strain is -13.2 %. The global longitudinal strain is abnormal. 2. Right ventricular systolic function is normal. The right ventricular size is mildly enlarged. There is mildly elevated pulmonary artery systolic pressure. The estimated right ventricular systolic pressure is 40.7 mmHg. 3. Left atrial size was moderately dilated. 4. Right atrial size was severely dilated. 5. The mitral valve is normal in structure. No evidence of mitral valve regurgitation. No evidence of mitral stenosis. There is mild holosystolic prolapse of both leaflets of the mitral valve. 6. Tricuspid valve regurgitation is moderate. 7. The aortic valve has been repaired/replaced. Aortic valve regurgitation is mild. No aortic stenosis is present. There is a 21 mm Magna Ease bioprosthetic valve  present in the aortic position. Procedure Date: 2016. Echo findings are consistent with normal structure and function of the aortic valve prosthesis. Aortic regurgitation PHT measures 395 msec. Aortic valve mean gradient measures 7.8 mmHg. Aortic valve Vmax measures 1.96 m/s. 8. The inferior vena cava is normal in size with greater than 50% respiratory variability, suggesting right atrial pressure of 3 mmHg.  FINDINGS Left Ventricle: Left ventricular ejection fraction, by estimation, is 60 to 65%. The left ventricle has normal function. The left ventricle has no regional wall motion abnormalities. The average left ventricular global longitudinal strain is -13.2 %. The global longitudinal strain is abnormal. The left ventricular internal cavity size was normal in size. There is no left ventricular hypertrophy. Left ventricular diastolic parameters are indeterminate.  Right Ventricle: The right ventricular size is mildly enlarged. No increase in right ventricular wall thickness. Right ventricular systolic function is normal. There is mildly elevated pulmonary artery systolic pressure. The tricuspid regurgitant velocity is 2.86 m/s, and with an assumed right atrial pressure of 8 mmHg, the estimated right ventricular systolic pressure is 40.7 mmHg.  Left Atrium: Left atrial size was moderately dilated.  Right Atrium: Right atrial size was severely dilated.  Pericardium: There is no evidence of pericardial effusion.  Mitral Valve: The mitral valve is normal  in structure. There is mild holosystolic prolapse of both leaflets of the mitral valve. No evidence of mitral valve regurgitation. No evidence of mitral valve stenosis.  Tricuspid Valve: The tricuspid valve is normal in structure. Tricuspid valve regurgitation is moderate . No evidence of tricuspid stenosis.  Aortic Valve: The aortic valve has been repaired/replaced. Aortic valve regurgitation is mild. Aortic regurgitation PHT measures 395 msec.  No aortic stenosis is present. Aortic valve mean gradient measures 7.8 mmHg. Aortic valve peak gradient measures 15.4 mmHg. Aortic valve area, by VTI measures 0.77 cm. There is a 21 mm Magna Ease bioprosthetic valve present in the aortic position. Procedure Date: 2016. Echo findings are consistent with normal structure and function of the aortic valve prosthesis.  Pulmonic Valve: The pulmonic valve was normal in structure. Pulmonic valve regurgitation is not visualized. No evidence of pulmonic stenosis.  Aorta: The aortic root is normal in size and structure.  Venous: The inferior vena cava is normal in size with greater than 50% respiratory variability, suggesting right atrial pressure of 3 mmHg.  IAS/Shunts: No atrial level shunt detected by color flow Doppler.   LEFT VENTRICLE PLAX 2D LVIDd:         4.10 cm   Diastology LVIDs:         2.70 cm   LV e' medial:    8.49 cm/s LV PW:         1.00 cm   LV E/e' medial:  12.3 LV IVS:        1.10 cm   LV e' lateral:   13.40 cm/s LVOT diam:     1.70 cm   LV E/e' lateral: 7.8 LV SV:         29 LV SV Index:   16        2D Longitudinal Strain LVOT Area:     2.27 cm  2D Strain GLS (A2C):   -12.1 % 2D Strain GLS (A3C):   -14.7 % 2D Strain GLS (A4C):   -12.8 % 2D Strain GLS Avg:     -13.2 %  RIGHT VENTRICLE RV Basal diam:  4.50 cm RV Mid diam:    3.60 cm RV S prime:     8.05 cm/s TAPSE (M-mode): 1.6 cm RVSP:           40.7 mmHg  LEFT ATRIUM             Index        RIGHT ATRIUM           Index LA diam:        5.10 cm 2.86 cm/m   RA Pressure: 8.00 mmHg LA Vol (A2C):   75.5 ml 42.36 ml/m  RA Area:     30.10 cm LA Vol (A4C):   84.2 ml 47.24 ml/m  RA Volume:   109.00 ml 61.15 ml/m LA Biplane Vol: 85.6 ml 48.02 ml/m AORTIC VALVE AV Area (Vmax):    0.71 cm AV Area (Vmean):   0.79 cm AV Area (VTI):     0.77 cm AV Vmax:           196.00 cm/s AV Vmean:          135.000 cm/s AV VTI:            0.377 m AV Peak Grad:      15.4 mmHg AV  Mean Grad:      7.8 mmHg LVOT Vmax:         60.97 cm/s LVOT Vmean:  47.100 cm/s LVOT VTI:          0.128 m LVOT/AV VTI ratio: 0.34 AI PHT:            395 msec  AORTA Ao Root diam: 2.80 cm Ao Asc diam:  2.80 cm  MITRAL VALVE                TRICUSPID VALVE MV Area (PHT):              TR Peak grad:   32.7 mmHg MV Decel Time:              TR Vmax:        286.00 cm/s MV E velocity: 104.70 cm/s  Estimated RAP:  8.00 mmHg RVSP:           40.7 mmHg  SHUNTS Systemic VTI:  0.13 m Systemic Diam: 1.70 cm  Donato Schultz MD Electronically signed by Donato Schultz MD Signature Date/Time: 09/16/2022/2:55:28 PM    Final   TEE  ECHO TEE 12/09/2019  Narrative TRANSESOPHOGEAL ECHO REPORT    Patient Name:   Orlene Plum Date of Exam: 12/09/2019 Medical Rec #:  [id number]             Height:       65.0 in Accession #:    [id number]            Weight:       163.6 lb Date of Birth:  1933-01-14            BSA:          1.816 m Patient Age:    86 years              BP:           134/84 mmHg Patient Gender: F                     HR:           122 bpm. Exam Location:  Inpatient  Procedure: TEE-Intraopertive  Indications:    Afib  History:        Patient has prior history of Echocardiogram examinations. Aortic Valve Disease; Arrythmias:Atrial Fibrillation.  Sonographer:    Ross Ludwig RDCS (AE) Referring Phys: [id number] MARK C SKAINS  PROCEDURE: The transesophogeal probe was passed without difficulty through the esophogus of the patient. Sedation performed by different physician. The patient developed no complications during the procedure.  IMPRESSIONS   1. Extensive 3 D rending done of AVR. 2. No LAA thormbus DCC x 1 200 J converted to NSR. 3. Left ventricular ejection fraction, by estimation, is 55%. The left ventricle has normal function. The left ventricle has no regional wall motion abnormalities. There is mild left ventricular hypertrophy. Left ventricular diastolic function  could not be evaluated. 4. Right ventricular systolic function is moderately reduced. The right ventricular size is moderately enlarged. 5. Left atrial size was severely dilated. No left atrial/left atrial appendage thrombus was detected. 6. Right atrial size was severely dilated. 7. The mitral valve is normal in structure. Mild mitral valve regurgitation. 8. Tricuspid valve regurgitation is moderate to severe. 9. Post AVR 2016 with 21 mm [facility] Ease stented pericardial tissue valve Mean gradient 8 mmHg Moderate PVL 1:00 on BSA views may be related to suture dehiscence as she had a bicuspid AV prior to implant . The aortic valve has been repaired/replaced. Aortic valve regurgitation is moderate.  FINDINGS Left Ventricle: Left ventricular ejection  fraction, by estimation, is 55%. The left ventricle has normal function. The left ventricle has no regional wall motion abnormalities. The left ventricular internal cavity size was normal in size. There is mild left ventricular hypertrophy. Left ventricular diastolic function could not be evaluated.  Right Ventricle: The right ventricular size is moderately enlarged. Right vetricular wall thickness was not assessed. Right ventricular systolic function is moderately reduced.  Left Atrium: Left atrial size was severely dilated. No left atrial/left atrial appendage thrombus was detected.  Right Atrium: Right atrial size was severely dilated.  Pericardium: There is no evidence of pericardial effusion.  Mitral Valve: The mitral valve is normal in structure. There is mild thickening of the mitral valve leaflet(s). There is mild calcification of the mitral valve leaflet(s). Mild mitral annular calcification. Mild mitral valve regurgitation.  Tricuspid Valve: The tricuspid valve is normal in structure. Tricuspid valve regurgitation is moderate to severe.  Aortic Valve: Post AVR 2016 with 21 mm [facility] Ease stented pericardial tissue valve Mean  gradient 8 mmHg Moderate PVL 1:00 on BSA views may be related to suture dehiscence as she had a bicuspid AV prior to implant. The aortic valve has been repaired/replaced. Aortic valve regurgitation is moderate. Aortic valve mean gradient measures 8.0 mmHg. Aortic valve peak gradient measures 15.7 mmHg. There is a 21 mm Edwards valve present in the aortic position.  Pulmonic Valve: The pulmonic valve was normal in structure. Pulmonic valve regurgitation is mild.  Aorta: The aortic root is normal in size and structure.  IAS/Shunts: No atrial level shunt detected by color flow Doppler.  Additional Comments: Extensive 3 D rending done of AVR. No LAA thormbus DCC x 1 200 J converted to NSR.   AORTIC VALVE AV Vmax:      198.00 cm/s AV Vmean:     124.000 cm/s AV VTI:       0.321 m AV Peak Grad: 15.7 mmHg AV Mean Grad: 8.0 mmHg  Charlton Haws MD Electronically signed by Charlton Haws MD Signature Date/Time: 12/09/2019/9:50:10 AM    Final   MONITORS  LONG TERM MONITOR (3-14 DAYS) 02/15/2020  Narrative 1. The basic rhythm is sinus bradycardia with an average HR of 49 bpm 2. No atrial fibrillation or flutter 3. No high-grade heart block or pathologic pauses 4. There are rare PVC's and occasional supraventricular beats without sustained arrhythmias.  PAC burden is 2.2%.          EKG:  EKG is not ordered today.    Recent Labs: 08/10/2022: ALT 30; BUN 19; Creatinine, Ser 0.84; Hemoglobin 13.5; Platelets 120; Potassium 4.2; Sodium 138 08/27/2022: TSH 1.00  Recent Lipid Panel    Component Value Date/Time   CHOL 152 08/27/2022 1045   TRIG 69.0 08/27/2022 1045   HDL 70.80 08/27/2022 1045   CHOLHDL 2 08/27/2022 1045   VLDL 13.8 08/27/2022 1045   LDLCALC 68 08/27/2022 1045   LDLDIRECT 144.0 11/29/2012 1601     Risk Assessment/Calculations:    CHA2DS2-VASc Score = 6   This indicates a 9.7% annual risk of stroke. The patient's score is based upon: CHF History: 1 HTN History:  1 Diabetes History: 0 Stroke History: 0 Vascular Disease History: 1 Age Score: 2 Gender Score: 1               Physical Exam:    VS:  BP 102/70   Pulse 88   Ht 5\' 5"  (1.651 m)   Wt 161 lb (73 kg)   SpO2 95%  BMI 26.79 kg/m     Wt Readings from Last 3 Encounters:  09/19/22 161 lb (73 kg)  08/21/22 160 lb (72.6 kg)  08/10/22 160 lb 7.9 oz (72.8 kg)     GEN:  Well nourished, well developed in no acute distress HEENT: Normal NECK: No JVD; No carotid bruits LYMPHATICS: No lymphadenopathy CARDIAC: irregularly irregular, soft systolic murmur at the RUSB, no diastolic murmur RESPIRATORY:  Clear to auscultation without rales, wheezing or rhonchi  ABDOMEN: Soft, non-tender, non-distended MUSCULOSKELETAL:  No edema; No deformity  SKIN: Warm and dry NEUROLOGIC:  Alert and oriented x 3 PSYCHIATRIC:  Normal affect   ASSESSMENT:    1. Persistent atrial fibrillation (HCC)   2. S/P AVR (aortic valve replacement)   3. Paravalvular leak of prosthetic heart valve, subsequent encounter   4. Chronic diastolic heart failure (HCC)    PLAN:    In order of problems listed above:  Tolerating apixaban.  Continue metoprolol succinate for rate control. Stable findings on most recent echo reviewed above.  Mild paravalvular regurgitation has not progressed.  Transvalvular gradients are normal.  LVEF is normal.  Continue annual surveillance. As above, stable, mild paravalvular regurgitation The patient seems to be having progressive symptoms with edema, shortness of breath, and fatigue.  She is taking more furosemide to maintain her weight.  Her weight over time is stable based on our scales.  I advised her to continue her current furosemide dose.  I advised her to start Jardiance 10 mg daily.  We discussed potential symptoms of GU infection and she has not had any issues with this.  I would like her to return in 2 weeks for lab work and a Surveyor, quantity.D. clinic visit.  She may be able to reduce her  furosemide dose depending on her response to Jardiance.  It would be reasonable to add spironolactone 12.5 mg daily depending on her Jardiance response and tolerance.           Medication Adjustments/Labs and Tests Ordered: Current medicines are reviewed at length with the patient today.  Concerns regarding medicines are outlined above.  Orders Placed This Encounter  Procedures   Basic metabolic panel   AMB Referral to [facility]   Meds ordered this encounter  Medications   empagliflozin (JARDIANCE) 10 MG TABS tablet    Sig: Take 1 tablet (10 mg total) by mouth daily before breakfast.    Dispense:  30 tablet    Refill:  6   empagliflozin (JARDIANCE) 10 MG TABS tablet    Sig: Take 1 tablet (10 mg total) by mouth daily.    Dispense:  28 tablet    Refill:  0    Order Specific Question:   Lot Number?    Answer:   [id number]    Order Specific Question:   Expiration Date?    Answer:   05/27/2024    Patient Instructions  Medication Instructions:  Your physician has recommended you make the following change in your medication:  1-START Jardiance 10 mg by mouth daily.  *If you need a refill on your cardiac medications before your next appointment, please call your pharmacy*   Lab Work: Your physician recommends that you return for lab work in: 2 weeks for BMET  If you have labs (blood work) drawn today and your tests are completely normal, you will receive your results only by: MyChart Message (if you have MyChart) OR A paper copy in the mail If you have any lab test that is abnormal  or we need to change your treatment, we will call you to review the results.  Follow-Up: At [facility], you and your health needs are our priority.  As part of our continuing mission to provide you with exceptional heart care, we have created designated Provider Care Teams.  These Care Teams include your primary Cardiologist (physician) and Advanced Practice Providers (APPs -   Physician Assistants and Nurse Practitioners) who all work together to provide you with the care you need, when you need it.  We recommend signing up for the patient portal called "MyChart".  Sign up information is provided on this After Visit Summary.  MyChart is used to connect with patients for Virtual Visits (Telemedicine).  Patients are able to view lab/test results, encounter notes, upcoming appointments, etc.  Non-urgent messages can be sent to your provider as well.   To learn more about what you can do with MyChart, go to [email or web address].    Your next appointment:   6 month(s)  Provider:   Tonny Bollman, MD     Other Instructions You have been referred to Pharmacist Clinic in 3 weeks.    Signed, Pamela Bollman, MD  09/19/2022 5:36 PM    [facility]

## 2022-09-23 ENCOUNTER — Ambulatory Visit: Payer: PPO | Admitting: Allergy and Immunology

## 2022-09-29 ENCOUNTER — Other Ambulatory Visit: Payer: Self-pay | Admitting: Internal Medicine

## 2022-10-01 ENCOUNTER — Encounter: Payer: Self-pay | Admitting: Cardiovascular Disease

## 2022-10-01 NOTE — Telephone Encounter (Signed)
Tonny Bollman, MD  You; Cv Div Ch St Triage2 minutes ago (1:35 PM)    This is ok with me, thanks      Pt made aware of what Dr. Excell Seltzer said, via [place] message.

## 2022-10-01 NOTE — Telephone Encounter (Signed)
This is ok with me, thanks

## 2022-10-03 ENCOUNTER — Ambulatory Visit: Payer: PPO | Attending: Cardiovascular Disease

## 2022-10-04 ENCOUNTER — Encounter: Payer: Self-pay | Admitting: Internal Medicine

## 2022-10-04 LAB — BASIC METABOLIC PANEL
BUN/Creatinine Ratio: 20 (ref 12–28)
BUN: 20 mg/dL (ref 8–27)
CO2: 22 mmol/L (ref 20–29)
Calcium: 9.4 mg/dL (ref 8.7–10.3)
Chloride: 101 mmol/L (ref 96–106)
Creatinine, Ser: 1 mg/dL (ref 0.57–1.00)
Glucose: 87 mg/dL (ref 70–99)
Potassium: 4.1 mmol/L (ref 3.5–5.2)
Sodium: 138 mmol/L (ref 134–144)
eGFR: 55 mL/min/{1.73_m2} — ABNORMAL LOW (ref >59)

## 2022-10-08 ENCOUNTER — Encounter: Payer: Self-pay | Admitting: Internal Medicine

## 2022-10-08 NOTE — Progress Notes (Signed)
Subjective:    Patient ID: [name], female    DOB: 1932-09-28, 89 y.o.   MRN: [id number]     HPI [name] is here for follow up of her chronic medical problems.  Dec jaridance to qod.  Lasix daily - taking both daily was too much - got her dehydrated  Afib paroxysmal.  Incline - feels a little SOB.  Not walking as much   Swallowing not as good - liquids, pills - occ - no issues with food  Medications and allergies reviewed with patient and updated if appropriate.  Current Outpatient Medications on File Prior to Visit  Medication Sig Dispense Refill   acetaminophen (TYLENOL) 500 MG tablet Take 500-1,000 mg by mouth every 6 (six) hours as needed for mild pain or moderate pain.      alendronate (FOSAMAX) 70 MG tablet Take 1 tablet (70 mg total) by mouth every 7 (seven) days. Take with a full glass of water on an empty stomach. 12 tablet 3   ALPRAZolam (XANAX) 0.5 MG tablet TAKE 1 TABLET TWICE DAILY AS NEEDED FOR AN ANXIETY, DO NOT TAKE AT NIGHT. 180 tablet 0   apixaban (ELIQUIS) 5 MG TABS tablet Take 1 tablet (5 mg total) by mouth 2 (two) times daily. 180 tablet 2   Azelastine HCl 0.15 % SOLN 2 sprays twice a day     Azelastine-Fluticasone (DYMISTA) 137-50 MCG/ACT SUSP 1 spray each nostril 1-2 times per day 23 g 1   Calcium Carb-Cholecalciferol 600-400 MG-UNIT TABS Take 1 tablet by mouth daily.     Chloral Hydrate CRYS Take 10 mls by mouth at bedtime as needed 900 g 2   Cholecalciferol (VITAMIN D-3) 1000 UNITS CAPS Take 1,000 Units by mouth daily.      clindamycin (CLEOCIN) 300 MG capsule Per patient taking prior to dental appointment     desonide (DESOWEN) 0.05 % cream      empagliflozin (JARDIANCE) 10 MG TABS tablet Take 1 tablet (10 mg total) by mouth daily before breakfast. (Patient taking differently: Take 10 mg by mouth daily before breakfast. Patient takes Jardiance every other day) 30 tablet 6   empagliflozin (JARDIANCE) 10 MG TABS tablet Take 1 tablet (10 mg  total) by mouth daily. 28 tablet 0   ferrous sulfate 325 (65 FE) MG tablet Take 325 mg by mouth 2 (two) times a week. Every Monday and Wednesday     furosemide (LASIX) 20 MG tablet Take 1 tablet by mouth daily. If greater than 3 pound gain in 24 hours, increase to 2 tablets daily until weight returns to normal 90 tablet 2   ipratropium (ATROVENT) 0.06 % nasal spray 2 sprays each nostril every 6 hours to dry nose 15 mL 5   levocetirizine (XYZAL) 5 MG tablet Take 1 tablet (5 mg total) by mouth every evening. 90 tablet 1   Magnesium Oxide 250 MG TABS Take by mouth.     metoprolol succinate (TOPROL XL) 25 MG 24 hr tablet Take 1 tablet (25 mg total) by mouth daily. 90 tablet 3   nitroGLYCERIN (NITROSTAT) 0.4 MG SL tablet Place 1 tablet (0.4 mg total) under the tongue every 5 (five) minutes as needed for chest pain (CP or SOB). 25 tablet 3   pantoprazole (PROTONIX) 40 MG tablet TAKE 1 TABLET BY MOUTH TWICE DAILY. 180 tablet 3   Polyethyl Glycol-Propyl Glycol (SYSTANE ULTRA) 0.4-0.3 % SOLN Place 1 drop into both eyes daily as needed (dry eyes).  polyethylene glycol (MIRALAX / GLYCOLAX) 17 g packet Take 17 g by mouth daily.     potassium chloride (KLOR-CON) 10 MEQ tablet Take 1 tablet (10 mEq total) by mouth daily. 90 tablet 2   rosuvastatin (CRESTOR) 40 MG tablet Take 0.5 tablets (20 mg total) by mouth daily. 90 tablet 3   RYALTRIS 665-25 MCG/ACT SUSP 2 sprays per nostril 1-2 times daily 29 g 5   tretinoin (RETIN-A) 0.05 % cream Apply topically.     No current facility-administered medications on file prior to visit.     Review of Systems  Constitutional:  Negative for fever.  HENT:  Positive for trouble swallowing (occ with pills, liquids).   Respiratory:  Positive for cough (ooc) and shortness of breath. Negative for wheezing.   Cardiovascular:  Negative for chest pain and palpitations. Leg swelling: well controlled. Gastrointestinal:  Positive for constipation.  Endocrine: Negative.    Neurological:  Negative for light-headedness and headaches.       Objective:   Vitals:   10/09/22 0924  BP: 130/80  Pulse: 77  Temp: 98.4 F (36.9 C)  SpO2: 97%   BP Readings from Last 3 Encounters:  10/09/22 130/80  09/19/22 102/70  08/21/22 118/78   Wt Readings from Last 3 Encounters:  10/09/22 156 lb (70.8 kg)  09/19/22 161 lb (73 kg)  08/21/22 160 lb (72.6 kg)   Body mass index is 25.96 kg/m.    Physical Exam Constitutional:      General: She is not in acute distress.    Appearance: Normal appearance.  HENT:     Head: Normocephalic and atraumatic.  Eyes:     Conjunctiva/sclera: Conjunctivae normal.  Cardiovascular:     Rate and Rhythm: Normal rate and regular rhythm.     Heart sounds: Normal heart sounds.  Pulmonary:     Effort: Pulmonary effort is normal. No respiratory distress.     Breath sounds: Normal breath sounds. No wheezing.  Musculoskeletal:     Cervical back: Neck supple.     Right lower leg: No edema.     Left lower leg: No edema.  Lymphadenopathy:     Cervical: No cervical adenopathy.  Skin:    General: Skin is warm and dry.     Findings: No rash.  Neurological:     Mental Status: She is alert. Mental status is at baseline.  Psychiatric:        Mood and Affect: Mood normal.        Behavior: Behavior normal.        Lab Results  Component Value Date   WBC 7.1 08/10/2022   HGB 13.5 08/10/2022   HCT 41.2 08/10/2022   PLT 120 (L) 08/10/2022   GLUCOSE 87 10/03/2022   CHOL 152 08/27/2022   TRIG 69.0 08/27/2022   HDL 70.80 08/27/2022   LDLDIRECT 144.0 11/29/2012   LDLCALC 68 08/27/2022   ALT 30 08/10/2022   AST 33 08/10/2022   NA 138 10/03/2022   K 4.1 10/03/2022   CL 101 10/03/2022   CREATININE 1.00 10/03/2022   BUN 20 10/03/2022   CO2 22 10/03/2022   TSH 1.00 08/27/2022   INR 1.0 03/06/2020   HGBA1C 6.2 08/27/2022     Assessment & Plan:    See Problem List for Assessment and Plan of chronic medical problems.

## 2022-10-08 NOTE — Patient Instructions (Addendum)
      Medications changes include :   none      Return in about 6 months (around 04/10/2023) for Physical Exam.

## 2022-10-09 ENCOUNTER — Ambulatory Visit (INDEPENDENT_AMBULATORY_CARE_PROVIDER_SITE_OTHER): Payer: PPO | Admitting: Internal Medicine

## 2022-10-09 ENCOUNTER — Ambulatory Visit: Payer: PPO | Attending: Cardiovascular Disease | Admitting: Student

## 2022-10-09 ENCOUNTER — Encounter: Payer: Self-pay | Admitting: Student

## 2022-10-09 ENCOUNTER — Ambulatory Visit: Payer: PPO

## 2022-10-09 VITALS — BP 128/78 | HR 77 | Wt 156.0 lb

## 2022-10-09 VITALS — BP 130/80 | HR 77 | Temp 98.4°F | Ht 65.0 in | Wt 156.0 lb

## 2022-10-09 DIAGNOSIS — F419 Anxiety disorder, unspecified: Secondary | ICD-10-CM

## 2022-10-09 DIAGNOSIS — I4821 Permanent atrial fibrillation: Secondary | ICD-10-CM

## 2022-10-09 DIAGNOSIS — I5032 Chronic diastolic (congestive) heart failure: Secondary | ICD-10-CM

## 2022-10-09 DIAGNOSIS — I10 Essential (primary) hypertension: Secondary | ICD-10-CM | POA: Diagnosis not present

## 2022-10-09 DIAGNOSIS — E782 Mixed hyperlipidemia: Secondary | ICD-10-CM

## 2022-10-09 DIAGNOSIS — E538 Deficiency of other specified B group vitamins: Secondary | ICD-10-CM

## 2022-10-09 DIAGNOSIS — M810 Age-related osteoporosis without current pathological fracture: Secondary | ICD-10-CM

## 2022-10-09 DIAGNOSIS — Z8711 Personal history of peptic ulcer disease: Secondary | ICD-10-CM

## 2022-10-09 DIAGNOSIS — I2510 Atherosclerotic heart disease of native coronary artery without angina pectoris: Secondary | ICD-10-CM

## 2022-10-09 DIAGNOSIS — I700 Atherosclerosis of aorta: Secondary | ICD-10-CM

## 2022-10-09 DIAGNOSIS — F5101 Primary insomnia: Secondary | ICD-10-CM

## 2022-10-09 DIAGNOSIS — R7303 Prediabetes: Secondary | ICD-10-CM | POA: Diagnosis not present

## 2022-10-09 MED ORDER — ROSUVASTATIN CALCIUM 20 MG PO TABS: 20.0000 mg | ORAL_TABLET | Freq: Every day | ORAL | 3 refills | Status: DC

## 2022-10-09 MED ORDER — NITROGLYCERIN 0.4 MG SL SUBL: 0.4000 mg | SUBLINGUAL_TABLET | SUBLINGUAL | 0 refills | Status: DC | PRN

## 2022-10-09 NOTE — Assessment & Plan Note (Signed)
Chronic Controlled, Stable Continue alprazolam 0.5 mg twice daily as needed 

## 2022-10-09 NOTE — Assessment & Plan Note (Addendum)
Chronic Regular exercise and healthy diet encouraged Lab Results  Component Value Date   LDLCALC 68 08/27/2022   Continue Crestor 40 mg daily

## 2022-10-09 NOTE — Assessment & Plan Note (Signed)
Chronic GERD controlled Continue pantoprazole 40 mg twice daily 

## 2022-10-09 NOTE — Assessment & Plan Note (Signed)
Chronic Lab Results  Component Value Date   HGBA1C 6.2 08/27/2022    Low sugar / carb diet Stressed regular exercise

## 2022-10-09 NOTE — Assessment & Plan Note (Signed)
Chronic ?Controlled, Stable ?Continue chloral hydrate 10 mL nightly ?

## 2022-10-09 NOTE — Assessment & Plan Note (Signed)
Chronic DEXA up-to-date Continue Fosamax 70 mg weekly-started 08/2021 Stressed regular exercise Continue calcium and vitamin D supplementation

## 2022-10-09 NOTE — Progress Notes (Signed)
Patient ID: [name]                 DOB: 1932/08/01                      MRN: [id number]     HPI: [name] is a 89 y.o. female referred by Dr. Copper to pharmacy clinic for HF medication management. PMH is significant for ICM,Afib,Bicuspid valve, aortic stenosis S/p bioprosthetic AVR in 2016,Coronary artery disease,S/p NSTEMI in 01/2019 diastolic CHF, HTN, HDL . Most recent LVEF 60-65%  on 09/16/2022. Patient saw Dr.Cooper on 09/19/2022. Jardiance 10 mg was added to optimize HF medications also patient was having progressive symptoms with edema, SOB and fatigue so was taking more furosemide. On June 6 patient called reports she is unable to tolerate Jardiance every day due to leg cramps so she self adjusted the dose to every other day and she tolerates every other day dose better   Today patient presented with her daughter. Patient reports she has been tolerating Jardiance every other day well. She takes lasix every day and her weight is stable. Denies  dizziness, lightheadedness, and fatigue, chest pain or palpitations. Feels SOB when walking slightly incline street. Able to complete all ADLs. Active around the house. She  checks her weight at home (~156 lbs). Denies LEE, PND, or orthopnea. Appetite has been normal. She  adheres to a low-salt diet.we went through all medications she is on and their indication. We discuss HF GDMT and importance of medication adherence, and patient engagement. Patient reports she does not have nitro tablet at home so re-ordered        Current CHF meds: Metoprolol 25 mg daily, Jardiance 10 mg  every other day  Previously tried: Jardiance 10 mg every day - excessive diuretic effect with lasix  Adherence Assessment  Do you ever forget to take your medication? [] Yes [] No  Do you ever skip doses due to side effects? [x] Yes [] No  Do you have trouble affording your medicines? [] Yes [x] No  Are you ever unable to pick up your medication due to  transportation difficulties? [] Yes [x] No  Do you ever stop taking your medications because you don't believe they are helping?  [x] Yes [] No  Do you check your weight daily? 156 lbs  [x] Yes [] No   Adherence strategy: pill pack   Barriers to obtaining medications: none  BP goal: <130/80     Social History:   Diet: low salt diet   Exercise: will start walking 30 min every other day as per PCP advise  Home BP readings: does not check at home but BP at PCP today morning 130/80   Wt Readings from Last 3 Encounters:  10/09/22 156 lb (70.8 kg)  09/19/22 161 lb (73 kg)  08/21/22 160 lb (72.6 kg)   BP Readings from Last 3 Encounters:  10/09/22 128/78  10/09/22 130/80  09/19/22 102/70   Pulse Readings from Last 3 Encounters:  10/09/22 77  10/09/22 77  09/19/22 88    Renal function: Estimated Creatinine Clearance: 39.1 mL/min (by C-G formula based on SCr of 1 mg/dL).  Past Medical History:  Diagnosis Date   Allergy    Anemia    Anxiety    Aortic valve stenosis, severe    s/p AVR // mild to mod paravalvular leakage // Echo 9/21: EF 55-60, no RWMA, mild LVH, GR 2 DD, mildly reduced RVSF, RVSP 38.7, moderate LAE, mild RAE, AVR with mean gradient 11 mmHg,  mild paravalvular leakage   Arthritis    Breast cancer (HCC) 2007   right   Cataract    Constipation    Dysrhythmia    A-fib   Hiatal hernia    pt denies   Hypercholesteremia    Hypertension    Hypoglycemia    Osteopenia    Personal history of radiation therapy    Tingling    Ulcer 2012   bleeding gastric Ulcer    Current Outpatient Medications on File Prior to Visit  Medication Sig Dispense Refill   acetaminophen (TYLENOL) 500 MG tablet Take 500-1,000 mg by mouth every 6 (six) hours as needed for mild pain or moderate pain.      alendronate (FOSAMAX) 70 MG tablet Take 1 tablet (70 mg total) by mouth every 7 (seven) days. Take with a full glass of water on an empty stomach. 12 tablet 3   ALPRAZolam (XANAX) 0.5  MG tablet TAKE 1 TABLET TWICE DAILY AS NEEDED FOR AN ANXIETY, DO NOT TAKE AT NIGHT. 180 tablet 0   apixaban (ELIQUIS) 5 MG TABS tablet Take 1 tablet (5 mg total) by mouth 2 (two) times daily. 180 tablet 2   Azelastine-Fluticasone (DYMISTA) 137-50 MCG/ACT SUSP 1 spray each nostril 1-2 times per day 23 g 1   Calcium Carb-Cholecalciferol 600-400 MG-UNIT TABS Take 1 tablet by mouth daily.     Chloral Hydrate CRYS Take 10 mls by mouth at bedtime as needed 900 g 2   Cholecalciferol (VITAMIN D-3) 1000 UNITS CAPS Take 1,000 Units by mouth daily.      clindamycin (CLEOCIN) 300 MG capsule Per patient taking prior to dental appointment     desonide (DESOWEN) 0.05 % cream      empagliflozin (JARDIANCE) 10 MG TABS tablet Take 1 tablet (10 mg total) by mouth daily before breakfast. (Patient taking differently: Take 10 mg by mouth daily before breakfast. Patient takes Jardiance every other day) 30 tablet 6   ferrous sulfate 325 (65 FE) MG tablet Take 325 mg by mouth 2 (two) times a week. Every Monday and Wednesday     furosemide (LASIX) 20 MG tablet Take 1 tablet by mouth daily. If greater than 3 pound gain in 24 hours, increase to 2 tablets daily until weight returns to normal 90 tablet 2   ipratropium (ATROVENT) 0.06 % nasal spray 2 sprays each nostril every 6 hours to dry nose 15 mL 5   Magnesium Oxide 250 MG TABS Take by mouth.     metoprolol succinate (TOPROL XL) 25 MG 24 hr tablet Take 1 tablet (25 mg total) by mouth daily. 90 tablet 3   pantoprazole (PROTONIX) 40 MG tablet TAKE 1 TABLET BY MOUTH TWICE DAILY. 180 tablet 3   Polyethyl Glycol-Propyl Glycol (SYSTANE ULTRA) 0.4-0.3 % SOLN Place 1 drop into both eyes daily as needed (dry eyes).     potassium chloride (KLOR-CON) 10 MEQ tablet Take 1 tablet (10 mEq total) by mouth daily. 90 tablet 2   rosuvastatin (CRESTOR) 20 MG tablet Take 1 tablet (20 mg total) by mouth daily. 90 tablet 3   tretinoin (RETIN-A) 0.05 % cream Apply topically.     [DISCONTINUED]  nitroGLYCERIN (NITROSTAT) 0.4 MG SL tablet Place 1 tablet (0.4 mg total) under the tongue every 5 (five) minutes as needed for chest pain (CP or SOB). 25 tablet 3   RYALTRIS 665-25 MCG/ACT SUSP 2 sprays per nostril 1-2 times daily 29 g 5   No current facility-administered medications on file prior to  visit.    Allergies  Allergen Reactions   Covid-19 (Mrna) Vaccine     fever   Lactose Intolerance (Gi) Diarrhea and Nausea Only    GI upset   Amoxicillin-Pot Clavulanate Hives and Rash   Amoxicillin-Pot Clavulanate Rash   Penicillin G Benzathine Rash   Penicillins Hives and Rash    30 years   Sulfa Antibiotics Hives and Rash   Sulfonamide Derivatives Hives and Rash     Assessment/Plan:    Chronic diastolic heart failure (HCC) Overview: EF history:  Most recent LVEF 60-65%  on 09/16/2022.  Admission history:  GDMT:  ACEi/ARB/ARNi - none  MRA - none - can consider adding  next once stabilize on Jardiance 10 mg daily dose  Beta blocker -  metoprolol Xl 25 mg  SGLT2i -  Dry weight: Weight as of 156 lbs 10/09/2022  and patient was euvolemic  Diuretic use:  lasix 20 mg daily    Assessment & Plan: Assessment: BP is controlled in office BP 128/78 mmHg heart rate 77 Takes metoprolol and lasix 20 mg every day and Jardiance every other day  Due to dehydration could not tolerate Jardiance and lasix every day -  Denies  dizziness, lightheadedness, and fatigue, chest pain or palpitations.  Feels SOB when walking slightly incline street.  Able to complete all ADLs. Active around the house.   Checks her weight at home (~156 lbs).  Denies LEE, PND, or orthopnea.  Appetite has been normal. She  adheres to a low-salt diet. Plan:  Start taking Jardiance 10 mg every day if notice too strong diuresis along with lasix 20 mg daily dose cut down lasix dose in 1/2 (10 mg) pharm D will follow up on tolerability on Monday 10/13/2022 Continue taking metoprolol XL 25 mg daily  Patient to keep  record of BP readings with heart rate and report to [place] at the next visit May add spironolactone 12.5 mg on Monday if able to tolerate above suggested changes  Follow up lab(s) : BMP in 2 weeks to assess e-lytes and renal function     Other orders -     Nitroglycerin; Place 1 tablet (0.4 mg total) under the tongue every 5 (five) minutes as needed for chest pain.  Dispense: 30 tablet; Refill: 0       Thank you   Carmela Hurt, Pharm.D  [facility] [address] [place], [place] [address]  Phone: [phone]; Fax: [phone]

## 2022-10-09 NOTE — Assessment & Plan Note (Signed)
Chronic ?Continue Crestor 20 mg daily ?Continue continue heart healthy diet and regular exercise ?

## 2022-10-09 NOTE — Assessment & Plan Note (Addendum)
Assessment: BP is controlled in office BP 128/78 mmHg heart rate 77 Takes metoprolol and lasix 20 mg every day and Jardiance every other day  Due to dehydration could not tolerate Jardiance and lasix every day -  Denies  dizziness, lightheadedness, and fatigue, chest pain or palpitations.  Feels SOB when walking slightly incline street.  Able to complete all ADLs. Active around the house.   Checks her weight at home (~156 lbs).  Denies LEE, PND, or orthopnea.  Appetite has been normal. She  adheres to a low-salt diet. Plan:  Start taking Jardiance 10 mg every day if notice too strong diuresis along with lasix 20 mg daily dose cut down lasix dose in 1/2 (10 mg) pharm D will follow up on tolerability on Monday 10/13/2022 Continue taking metoprolol XL 25 mg daily  Patient to keep record of BP readings with heart rate and report to [place] at the next visit May add spironolactone 12.5 mg on Monday if able to tolerate above suggested changes  Follow up lab(s) : BMP in 2 weeks to assess e-lytes and renal function

## 2022-10-09 NOTE — Patient Instructions (Addendum)
Changes made by your pharmacist Carmela Hurt, PharmD at today's visit:    Instructions/Changes  (what do you need to do) Your Notes  (what you did and when you did it)  Start taking Spironolactone 12.5 mg (1/2 tablet of 25 mg ) every morning  after discussion on Monday with PharmD    2.  Start taking Jardiance every day if notice too much diuretic effect cut Lasix dose in half , Pharm D will follow up on Monday June 17/2024 via phone     Bring all of your meds, your BP cuff and your record of home blood pressures to your next appointment.    HOW TO TAKE YOUR BLOOD PRESSURE AT HOME  Rest 5 minutes before taking your blood pressure.  Don't smoke or drink caffeinated beverages for at least 30 minutes before. Take your blood pressure before (not after) you eat. Sit comfortably with your back supported and both feet on the floor (don't cross your legs). Elevate your arm to heart level on a table or a desk. Use the proper sized cuff. It should fit smoothly and snugly around your bare upper arm. There should be enough room to slip a fingertip under the cuff. The bottom edge of the cuff should be 1 inch above the crease of the elbow. Ideally, take 3 measurements at one sitting and record the average.  Important lifestyle changes to control high blood pressure  Intervention  Effect on the BP  Lose extra pounds and watch your waistline Weight loss is one of the most effective lifestyle changes for controlling blood pressure. If you're overweight or obese, losing even a small amount of weight can help reduce blood pressure. Blood pressure might go down by about 1 millimeter of mercury (mm Hg) with each kilogram (about 2.2 pounds) of weight lost.  Exercise regularly As a general goal, aim for at least 30 minutes of moderate physical activity every day. Regular physical activity can lower high blood pressure by about 5 to 8 mm Hg.  Eat a healthy diet Eating a diet rich in whole grains, fruits,  vegetables, and low-fat dairy products and low in saturated fat and cholesterol. A healthy diet can lower high blood pressure by up to 11 mm Hg.  Reduce salt (sodium) in your diet Even a small reduction of sodium in the diet can improve heart health and reduce high blood pressure by about 5 to 6 mm Hg.  Limit alcohol One drink equals 12 ounces of beer, 5 ounces of wine, or 1.5 ounces of 80-proof liquor.  Limiting alcohol to less than one drink a day for women or two drinks a day for men can help lower blood pressure by about 4 mm Hg.   If you have any questions or concerns please use My Chart to send questions or call the office at [phone]

## 2022-10-09 NOTE — Assessment & Plan Note (Signed)
Chronic Following with cardiology No symptoms consistent with angina Continue current medications 

## 2022-10-09 NOTE — Assessment & Plan Note (Signed)
Chronic Continue B12 supplementation 

## 2022-10-09 NOTE — Assessment & Plan Note (Signed)
Chronic Following with cardiology On Eliquis 5 mg twice daily, metoprolol xl 25 mg daily Euvolemic

## 2022-10-09 NOTE — Assessment & Plan Note (Signed)
Chronic Blood pressure is well-controlled Continue metoprolol XL 25 mg daily

## 2022-10-12 NOTE — Progress Notes (Unsigned)
Patient Care Team: Pincus Sanes, MD as PCP - General (Internal Medicine) Tonny Bollman, MD as PCP - Cardiology (Cardiology) Pyrtle, Carie Caddy, MD as Consulting Physician (Gastroenterology) Tonny Bollman, MD as Consulting Physician (Cardiology) Serena Croissant, MD as Consulting Physician (Hematology and Oncology) Eber Jones, MD as Referring Physician (Ophthalmology) Levert Feinstein, MD as Consulting Physician (Neurology) Mansouraty, Netty Starring., MD as Consulting Physician (Gastroenterology) Kathyrn Sheriff, [facility] as Pharmacist (Pharmacist) Pincus Sanes, MD as Consulting Physician (Internal Medicine)   CHIEF COMPLAINT: Long term survivorship for h/o right breast cancer   Oncology History  History of right breast cancer  02/16/2004 Initial Diagnosis   Right breast mass biopsy: Invasive mammary cancer, MRI revealed 1.3 cm mass upper inner quadrant right breast   03/05/2004 Surgery   Right lumpectomy: 0.9 cm grade 1 invasive lobular cancer, ER positive, PR negative, HER-2 negative, Ki-67 5%, sentinel lymph nodes negative   04/08/2004 - 05/09/2004 Radiation Therapy   Adjuvant radiation therapy   06/10/2004 - 06/09/2009 Anti-estrogen oral therapy   MA-27 trial and placed on the Aromasin arm.  She completed one year of Aromasin, and it was discontinued due to severe joint aches and pains, followed by Tamoxifen for 2-3 years, and then Letrozole to complete out a total of 5 years      CURRENT THERAPY: Surveillance   INTERVAL HISTORY [name] returns for follow up as scheduled. Last seen by me 10/09/21. Mammogram 05/2022 was negative. She is doing well, no breast concerns. Main issue with with Afib. Meds are being adjusted by PCP.   ROS  All other systems reviewed and negative   Past Medical History:  Diagnosis Date   Allergy    Anemia    Anxiety    Aortic valve stenosis, severe    s/p AVR // mild to mod paravalvular leakage // Echo 9/21: EF 55-60, no RWMA, mild LVH, GR 2 DD,  mildly reduced RVSF, RVSP 38.7, moderate LAE, mild RAE, AVR with mean gradient 11 mmHg, mild paravalvular leakage   Arthritis    Breast cancer (HCC) 2007   right   Cataract    Constipation    Dysrhythmia    A-fib   Hiatal hernia    pt denies   Hypercholesteremia    Hypertension    Hypoglycemia    Osteopenia    Personal history of radiation therapy    Tingling    Ulcer 2012   bleeding gastric Ulcer     Past Surgical History:  Procedure Laterality Date   AORTIC VALVE REPLACEMENT N/A 11/16/2014   Procedure: AORTIC VALVE REPLACEMENT (AVR);  Surgeon: Alleen Borne, MD;  Location: [facility] OR;  Service: Open Heart Surgery;  Laterality: N/A;   BIOPSY  12/15/2017   Procedure: BIOPSY;  Surgeon: Meridee Score Netty Starring., MD;  Location: [facility] ENDOSCOPY;  Service: Gastroenterology;;   BIOPSY  01/10/2021   Procedure: BIOPSY;  Surgeon: Lemar Lofty., MD;  Location: [facility] ENDOSCOPY;  Service: Gastroenterology;;   BREAST BIOPSY     BREAST LUMPECTOMY Right    2005?   BREAST SURGERY     BRONCHIAL BIOPSY  03/06/2020   Procedure: BRONCHIAL BIOPSIES;  Surgeon: Leslye Peer, MD;  Location: [facility] ENDOSCOPY;  Service: Pulmonary;;   BRONCHIAL BRUSHINGS  03/06/2020   Procedure: BRONCHIAL BRUSHINGS;  Surgeon: Leslye Peer, MD;  Location: [facility] ENDOSCOPY;  Service: Pulmonary;;   BRONCHIAL NEEDLE ASPIRATION BIOPSY  03/06/2020   Procedure: BRONCHIAL NEEDLE ASPIRATION BIOPSIES;  Surgeon: Leslye Peer, MD;  Location: [facility] ENDOSCOPY;  Service: Pulmonary;;   BRONCHIAL WASHINGS  03/06/2020   Procedure: BRONCHIAL WASHINGS;  Surgeon: Leslye Peer, MD;  Location: [facility] ENDOSCOPY;  Service: Pulmonary;;   CARDIAC CATHETERIZATION N/A 10/18/2014   Procedure: Right/Left Heart Cath and Coronary Angiography;  Surgeon: Tonny Bollman, MD;  Location: [facility] INVASIVE CV LAB;  Service: Cardiovascular;  Laterality: N/A;   CARDIOVERSION N/A 12/09/2019   Procedure: CARDIOVERSION;  Surgeon: Wendall Stade, MD;  Location: [facility] ENDOSCOPY;   Service: Cardiovascular;  Laterality: N/A;   CARDIOVERSION N/A 12/30/2019   Procedure: CARDIOVERSION;  Surgeon: Wendall Stade, MD;  Location: [facility] ENDOSCOPY;  Service: Cardiovascular;  Laterality: N/A;   COLONOSCOPY WITH PROPOFOL N/A 01/10/2021   Procedure: COLONOSCOPY WITH PROPOFOL;  Surgeon: Lemar Lofty., MD;  Location: [facility] ENDOSCOPY;  Service: Gastroenterology;  Laterality: N/A;   CORONARY ANGIOGRAPHY N/A 02/04/2019   Procedure: CORONARY ANGIOGRAPHY;  Surgeon: Yvonne Kendall, MD;  Location: [facility] INVASIVE CV LAB;  Service: Cardiovascular;  Laterality: N/A;   COSMETIC SURGERY     face   ESOPHAGOGASTRODUODENOSCOPY (EGD) WITH PROPOFOL N/A 12/15/2017   Procedure: ESOPHAGOGASTRODUODENOSCOPY (EGD) WITH PROPOFOL;  Surgeon: Meridee Score Netty Starring., MD;  Location: [facility] ENDOSCOPY;  Service: Gastroenterology;  Laterality: N/A;   FIDUCIAL MARKER PLACEMENT  03/06/2020   Procedure: FIDUCIAL MARKER PLACEMENT;  Surgeon: Leslye Peer, MD;  Location: [facility] ENDOSCOPY;  Service: Pulmonary;;   PARATHYROIDECTOMY     TEE WITHOUT CARDIOVERSION N/A 11/16/2014   Procedure: TRANSESOPHAGEAL ECHOCARDIOGRAM (TEE);  Surgeon: Alleen Borne, MD;  Location: [facility] OR;  Service: Open Heart Surgery;  Laterality: N/A;   TEE WITHOUT CARDIOVERSION N/A 12/09/2019   Procedure: TRANSESOPHAGEAL ECHOCARDIOGRAM (TEE);  Surgeon: Wendall Stade, MD;  Location: [facility] ENDOSCOPY;  Service: Cardiovascular;  Laterality: N/A;   TONSILLECTOMY     VIDEO BRONCHOSCOPY WITH ENDOBRONCHIAL NAVIGATION Right 03/06/2020   Procedure: VIDEO BRONCHOSCOPY WITH ENDOBRONCHIAL NAVIGATION;  Surgeon: Leslye Peer, MD;  Location: [facility] ENDOSCOPY;  Service: Pulmonary;  Laterality: Right;     Outpatient Encounter Medications as of 10/14/2022  Medication Sig   acetaminophen (TYLENOL) 500 MG tablet Take 500-1,000 mg by mouth every 6 (six) hours as needed for mild pain or moderate pain.    alendronate (FOSAMAX) 70 MG tablet Take 1 tablet (70 mg total) by mouth every 7  (seven) days. Take with a full glass of water on an empty stomach.   ALPRAZolam (XANAX) 0.5 MG tablet TAKE 1 TABLET TWICE DAILY AS NEEDED FOR AN ANXIETY, DO NOT TAKE AT NIGHT.   apixaban (ELIQUIS) 5 MG TABS tablet Take 1 tablet (5 mg total) by mouth 2 (two) times daily.   Azelastine-Fluticasone (DYMISTA) 137-50 MCG/ACT SUSP 1 spray each nostril 1-2 times per day   Calcium Carb-Cholecalciferol 600-400 MG-UNIT TABS Take 1 tablet by mouth daily.   Chloral Hydrate CRYS Take 10 mls by mouth at bedtime as needed   Cholecalciferol (VITAMIN D-3) 1000 UNITS CAPS Take 1,000 Units by mouth daily.    clindamycin (CLEOCIN) 300 MG capsule Per patient taking prior to dental appointment   desonide (DESOWEN) 0.05 % cream    empagliflozin (JARDIANCE) 10 MG TABS tablet Take 1 tablet (10 mg total) by mouth daily before breakfast. (Patient taking differently: Take 10 mg by mouth daily before breakfast. Patient takes Jardiance every other day)   ferrous sulfate 325 (65 FE) MG tablet Take 325 mg by mouth 2 (two) times a week. Every Monday and Wednesday   furosemide (LASIX) 20 MG tablet Take 1 tablet by mouth  daily. If greater than 3 pound gain in 24 hours, increase to 2 tablets daily until weight returns to normal   ipratropium (ATROVENT) 0.06 % nasal spray 2 sprays each nostril every 6 hours to dry nose   Magnesium Oxide 250 MG TABS Take by mouth.   metoprolol succinate (TOPROL XL) 25 MG 24 hr tablet Take 1 tablet (25 mg total) by mouth daily.   nitroGLYCERIN (NITROSTAT) 0.4 MG SL tablet Place 1 tablet (0.4 mg total) under the tongue every 5 (five) minutes as needed for chest pain.   pantoprazole (PROTONIX) 40 MG tablet TAKE 1 TABLET BY MOUTH TWICE DAILY.   Polyethyl Glycol-Propyl Glycol (SYSTANE ULTRA) 0.4-0.3 % SOLN Place 1 drop into both eyes daily as needed (dry eyes).   potassium chloride (KLOR-CON) 10 MEQ tablet Take 1 tablet (10 mEq total) by mouth daily.   rosuvastatin (CRESTOR) 20 MG tablet Take 1 tablet (20  mg total) by mouth daily.   RYALTRIS [id number] MCG/ACT SUSP 2 sprays per nostril 1-2 times daily   tretinoin (RETIN-A) 0.05 % cream Apply topically.   [DISCONTINUED] nitroGLYCERIN (NITROSTAT) 0.4 MG SL tablet Place 1 tablet (0.4 mg total) under the tongue every 5 (five) minutes as needed for chest pain (CP or SOB).   No facility-administered encounter medications on file as of 10/14/2022.     There were no vitals filed for this visit. There is no height or weight on file to calculate BMI.   PHYSICAL EXAM GENERAL:alert, no distress and comfortable SKIN: no rash  EYES: sclera clear NECK: without mass LYMPH:  no palpable cervical or supraclavicular lymphadenopathy  LUNGS: clear with normal breathing effort HEART: regular rate & rhythm, no lower extremity edema ABDOMEN: abdomen soft, non-tender and normal bowel sounds NEURO: alert & oriented x 3 with fluent speech, no focal motor/sensory deficits Breast exam:  PAC without erythema    CBC    Component Value Date/Time   WBC 7.1 08/10/2022 1103   RBC 4.22 08/10/2022 1103   HGB 13.5 08/10/2022 1103   HGB 13.6 12/06/2019 1335   HGB 13.5 10/31/2014 1430   HCT 41.2 08/10/2022 1103   HCT 41.8 12/06/2019 1335   HCT 40.7 10/31/2014 1430   PLT 120 (L) 08/10/2022 1103   PLT 176 12/06/2019 1335   MCV 97.6 08/10/2022 1103   MCV 95 12/06/2019 1335   MCV 95.4 10/31/2014 1430   MCH 32.0 08/10/2022 1103   MCHC 32.8 08/10/2022 1103   RDW 13.6 08/10/2022 1103   RDW 12.5 12/06/2019 1335   RDW 13.2 10/31/2014 1430   LYMPHSABS 2.3 02/18/2022 1108   LYMPHSABS 2.2 10/31/2014 1430   MONOABS 0.6 02/18/2022 1108   MONOABS 0.6 10/31/2014 1430   EOSABS 0.1 02/18/2022 1108   EOSABS 0.1 10/31/2014 1430   BASOSABS 0.1 02/18/2022 1108   BASOSABS 0.0 10/31/2014 1430     CMP     Component Value Date/Time   NA 138 10/03/2022 1118   NA 135 (L) 10/31/2014 1430   K 4.1 10/03/2022 1118   K 4.3 10/31/2014 1430   CL 101 10/03/2022 1118   CO2 22  10/03/2022 1118   CO2 24 10/31/2014 1430   GLUCOSE 87 10/03/2022 1118   GLUCOSE 117 (H) 08/10/2022 1103   GLUCOSE 95 10/31/2014 1430   BUN 20 10/03/2022 1118   BUN 15.0 10/31/2014 1430   CREATININE 1.00 10/03/2022 1118   CREATININE 0.90 06/06/2020 0919   CREATININE 0.75 05/13/2018 1016   CREATININE 0.8 10/31/2014 1430   CALCIUM  9.4 10/03/2022 1118   CALCIUM 9.0 10/31/2014 1430   PROT 6.1 (L) 08/10/2022 1103   PROT 6.1 01/17/2021 1116   PROT 6.3 (L) 10/31/2014 1430   ALBUMIN 4.4 08/10/2022 1103   ALBUMIN 4.6 01/17/2021 1116   ALBUMIN 3.8 10/31/2014 1430   AST 33 08/10/2022 1103   AST 25 10/31/2014 1430   ALT 30 08/10/2022 1103   ALT 20 10/31/2014 1430   ALKPHOS 83 08/10/2022 1103   ALKPHOS 63 10/31/2014 1430   BILITOT 0.9 08/10/2022 1103   BILITOT 0.6 01/17/2021 1116   BILITOT 0.66 10/31/2014 1430   GFRNONAA >60 08/10/2022 1103   GFRNONAA >60 06/06/2020 0919   GFRNONAA 74 05/13/2018 1016   GFRAA 64 12/19/2019 1154   GFRAA 85 05/13/2018 1016     ASSESSMENT & PLAN:Ms.Raelene Bott is a pleasant 89 y.o. female with history of stage IA right breast invasive lobular carcinoma, ER+/PR+/HER2-, diagnosed in 2005, treated in [place] with lumpectomy, adjuvant radiation therapy, and anti-estrogen therapy x5 years completed in 2011.    1.  History of lobular breast cancer:  Ms. Mariconda is clinically doing well. Breast exam is benign. CBC and CMP April/May 2024 are stable with mild thrombocytopenia.  Bilateral screening mammogram from 06/24/22 was negative.  Overall there is no clinical concern for breast cancer recurrence.  She prefers to continue long-term follow-up in our survivorship clinic, given no other provider does her breast exams.  She will return in 1 year.  She knows to contact the office sooner if she develops new concerns or questions.   2.  Right lung adenocarcinoma/NSCLC, [id number], stage I (02/2020) -She was not felt to be a good surgical candidate, Dr. Delton Coombes referred for  RT -S/p SBRT per Dr. Mitzi Hansen and Laurence Aly, PA -CT chest CT q. 3-6 months per Monico Blitz, PA, last scan 07/23/22 was stable, no active disease  -Next 11/28/22   3.  A-fib, ?  CHF  -She reports 3-year history of A-fib, her main presentation is irregular rhythm and leg discomfort.  She is not aware of a heart failure diagnosis. -She reports a 4 pound weight gain in 1 week, which is consistent with CHF -Last echo 05/2021 with stable -Recent med adjustments per cardiologist Dr. Excell Seltzer, PCP, and pharmacist    4. Colon pneumatosis -seen on f/up CT chest 09/2020 for treated R lung cancer and confirmed on colonoscopy 01/10/2021 by Dr. Meridee Score -No plans to repeat colonoscopy due to age   14. Bone health: Last DEXA 08/28/2021 showed osteopenia with high FRAX score, she began Fosamax.  Continue calcium, vitamin D, and weightbearing exercise   6. Health maintenance and wellness promotion: Ms. Ernsberger was encouraged to consume 5-7 servings of fruits and vegetables per day and engage in exercise.  She was instructed to limit her alcohol consumption and continue to abstain from tobacco use       PLAN: -Recent mammogram and labs reviewed -Continue breast cancer surveillance, f/up in 1 year -CT chest 11/28/22 as scheduled, followed by rad onc -Continue routine f/up with PCP, cardiology    All questions were answered. The patient knows to call the clinic with any problems, questions or concerns. No barriers to learning were detected.   Santiago Glad, NP-C 10/14/2022

## 2022-10-13 ENCOUNTER — Telehealth: Payer: Self-pay | Admitting: Pharmacist

## 2022-10-13 DIAGNOSIS — I5032 Chronic diastolic (congestive) heart failure: Secondary | ICD-10-CM

## 2022-10-13 MED ORDER — SPIRONOLACTONE 25 MG PO TABS: 12.5000 mg | ORAL_TABLET | Freq: Every day | ORAL | 3 refills | Status: DC

## 2022-10-13 NOTE — Addendum Note (Signed)
Addended by: Tylene Fantasia on: 10/13/2022 04:58 PM   Modules accepted: Orders

## 2022-10-13 NOTE — Telephone Encounter (Signed)
Called patient, states she gets muscle cramps when she takes Jardiance 10 mg every day. So advised her to switch to every other day will initiate Spironolactone 12.5 mg daily follow up BMP is due on Monday. Patient prefers to go to [facility]. Asked patient not to take potassium when she start taking Spironolactone 12.5 mg. Will assess need for potasium supplement in one week.

## 2022-10-13 NOTE — Telephone Encounter (Signed)
Call to see how patient is doing on Jardiance. Started taking every day instead of every other day from Friday 10/10/22.  Patient said she can not talk she will call me back once be home around 4:30 pm

## 2022-10-14 ENCOUNTER — Other Ambulatory Visit: Payer: Self-pay

## 2022-10-14 ENCOUNTER — Encounter: Payer: Self-pay | Admitting: Nurse Practitioner

## 2022-10-14 ENCOUNTER — Inpatient Hospital Stay: Payer: PPO | Attending: Nurse Practitioner | Admitting: Nurse Practitioner

## 2022-10-14 VITALS — BP 133/80 | HR 96 | Resp 18 | Ht 65.0 in | Wt 160.8 lb

## 2022-10-14 DIAGNOSIS — Z853 Personal history of malignant neoplasm of breast: Secondary | ICD-10-CM | POA: Diagnosis present

## 2022-10-14 DIAGNOSIS — K6389 Other specified diseases of intestine: Secondary | ICD-10-CM | POA: Insufficient documentation

## 2022-10-14 DIAGNOSIS — D696 Thrombocytopenia, unspecified: Secondary | ICD-10-CM | POA: Diagnosis not present

## 2022-10-14 DIAGNOSIS — M8580 Other specified disorders of bone density and structure, unspecified site: Secondary | ICD-10-CM | POA: Insufficient documentation

## 2022-10-14 DIAGNOSIS — Z85118 Personal history of other malignant neoplasm of bronchus and lung: Secondary | ICD-10-CM | POA: Diagnosis not present

## 2022-10-14 DIAGNOSIS — I4891 Unspecified atrial fibrillation: Secondary | ICD-10-CM | POA: Diagnosis not present

## 2022-10-14 DIAGNOSIS — Z923 Personal history of irradiation: Secondary | ICD-10-CM | POA: Insufficient documentation

## 2022-10-14 DIAGNOSIS — C3411 Malignant neoplasm of upper lobe, right bronchus or lung: Secondary | ICD-10-CM

## 2022-10-20 ENCOUNTER — Encounter: Payer: Self-pay | Admitting: Cardiovascular Disease

## 2022-10-20 ENCOUNTER — Other Ambulatory Visit: Payer: Self-pay | Admitting: Cardiovascular Disease

## 2022-10-20 NOTE — Telephone Encounter (Signed)
Call patient to remind for lab

## 2022-10-21 ENCOUNTER — Ambulatory Visit (INDEPENDENT_AMBULATORY_CARE_PROVIDER_SITE_OTHER): Payer: PPO | Admitting: Allergy and Immunology

## 2022-10-21 ENCOUNTER — Other Ambulatory Visit: Payer: Self-pay

## 2022-10-21 VITALS — BP 118/76 | HR 84 | Temp 98.0°F | Resp 16 | Ht 64.57 in | Wt 159.0 lb

## 2022-10-21 DIAGNOSIS — J3089 Other allergic rhinitis: Secondary | ICD-10-CM

## 2022-10-21 DIAGNOSIS — J310 Chronic rhinitis: Secondary | ICD-10-CM

## 2022-10-21 LAB — BASIC METABOLIC PANEL
BUN/Creatinine Ratio: 16 (ref 12–28)
BUN: 17 mg/dL (ref 8–27)
CO2: 21 mmol/L (ref 20–29)
Calcium: 9.4 mg/dL (ref 8.7–10.3)
Chloride: 105 mmol/L (ref 96–106)
Creatinine, Ser: 1.06 mg/dL — ABNORMAL HIGH (ref 0.57–1.00)
Glucose: 101 mg/dL — ABNORMAL HIGH (ref 70–99)
Potassium: 4.4 mmol/L (ref 3.5–5.2)
Sodium: 142 mmol/L (ref 134–144)
eGFR: 51 mL/min/{1.73_m2} — ABNORMAL LOW (ref >59)

## 2022-10-21 MED ORDER — IPRATROPIUM BROMIDE 0.06 % NA SOLN: NASAL | 2 refills | Status: DC

## 2022-10-21 MED ORDER — RYALTRIS 665-25 MCG/ACT NA SUSP: NASAL | 5 refills | Status: DC

## 2022-10-21 MED ORDER — LEVOCETIRIZINE DIHYDROCHLORIDE 5 MG PO TABS: 5.0000 mg | ORAL_TABLET | Freq: Every day | ORAL | 3 refills | Status: DC | PRN

## 2022-10-21 NOTE — Patient Instructions (Addendum)
  1. Can use one of the following:  A.  Ryaltris - 2 sprays each nostril 1-2 times per day (specialty pharmacy)  2. IF NEEDED:   A. Ipratropium 0.06% - 2 sprays each nostril every 6 hours to dry nose  B. Levocetirizine 5 mg - 1 tablet 1 time per day  3. Return to clinic in 1 year  4. Plan for fall flu vaccine  5. Live life as you desire.

## 2022-10-21 NOTE — Progress Notes (Unsigned)
[place] - [place] - [place] - [place] - [place]   Follow-up Note  Referring Provider: Pincus Sanes, MD Primary Provider: Pincus Sanes, MD Date of Office Visit: 10/21/2022  Subjective:   [name]  (DOB: 27-Apr-1933) is a 89 y.o. female who [facility] in re-evaluation of the following:  HPI: Vaniyah returns to this clinic in evaluation of allergic and nonallergic rhinitis.  I last saw her in this clinic 24 June 2022.  She is done a lot better with her rhinitis issue while using her medications which includes a combination nasal steroid nasal antihistamine utilize once a day and ipratropium utilized several times per day and an antihistamine.  She still has a little bit of problem with some nasal drip although certainly much better.  Allergies as of 10/21/2022       Reactions   Covid-19 (mrna) Vaccine    fever   Lactose Intolerance (gi) Diarrhea, Nausea Only   GI upset   Amoxicillin-pot Clavulanate Hives, Rash   Amoxicillin-pot Clavulanate Rash   Penicillin G Benzathine Rash   Penicillins Hives, Rash   30 years   Sulfa Antibiotics Hives, Rash   Sulfonamide Derivatives Hives, Rash        Medication List    acetaminophen 500 MG tablet Commonly known as: TYLENOL Take 500-1,000 mg by mouth every 6 (six) hours as needed for mild pain or moderate pain.   alendronate 70 MG tablet Commonly known as: FOSAMAX Take 1 tablet (70 mg total) by mouth every 7 (seven) days. Take with a full glass of water on an empty stomach.   ALPRAZolam 0.5 MG tablet Commonly known as: XANAX TAKE 1 TABLET TWICE DAILY AS NEEDED FOR AN ANXIETY, DO NOT TAKE AT NIGHT.   apixaban 5 MG Tabs tablet Commonly known as: ELIQUIS Take 1 tablet (5 mg total) by mouth 2 (two) times daily.   Calcium Carb-Cholecalciferol 600-400 MG-UNIT Tabs Take 1 tablet by mouth daily.   Chloral Hydrate Crys Take 10 mls by mouth at bedtime as needed    clindamycin 300 MG capsule Commonly known as: CLEOCIN Per patient taking prior to dental appointment   desonide 0.05 % cream Commonly known as: DESOWEN   empagliflozin 10 MG Tabs tablet Commonly known as: Jardiance Take 1 tablet (10 mg total) by mouth daily before breakfast. What changed: additional instructions   ferrous sulfate 325 (65 FE) MG tablet Take 325 mg by mouth 2 (two) times a week. Every Monday and Wednesday   furosemide 20 MG tablet Commonly known as: LASIX Take 1 tablet by mouth daily. If greater than 3 pound gain in 24 hours, increase to 2 tablets daily until weight returns to normal   ipratropium 0.06 % nasal spray Commonly known as: ATROVENT 2 sprays each nostril every 6 hours to dry nose   Magnesium Oxide 250 MG Tabs Take by mouth.   metoprolol succinate 25 MG 24 hr tablet Commonly known as: Toprol XL Take 1 tablet (25 mg total) by mouth daily.   nitroGLYCERIN 0.4 MG SL tablet Commonly known as: NITROSTAT Place 1 tablet (0.4 mg total) under the tongue every 5 (five) minutes as needed for chest pain.   pantoprazole 40 MG tablet Commonly known as: PROTONIX TAKE 1 TABLET BY MOUTH TWICE DAILY.   potassium chloride 10 MEQ tablet Commonly known as: KLOR-CON Take 1 tablet (10 mEq total) by mouth daily.   rosuvastatin 20 MG tablet Commonly known as: Crestor Take  1 tablet (20 mg total) by mouth daily.   Ryaltris 846-96 MCG/ACT Susp Generic drug: Olopatadine-Mometasone 2 sprays per nostril 1-2 times daily   spironolactone 25 MG tablet Commonly known as: ALDACTONE Take 0.5 tablets (12.5 mg total) by mouth daily.   Systane Ultra 0.4-0.3 % Soln Generic drug: Polyethyl Glycol-Propyl Glycol Place 1 drop into both eyes daily as needed (dry eyes).   tretinoin 0.05 % cream Commonly known as: RETIN-A Apply topically.   Vitamin D-3 25 MCG (1000 UT) Caps Take 1,000 Units by mouth daily.    Past Medical History:  Diagnosis Date   Allergy    Anemia     Anxiety    Aortic valve stenosis, severe    s/p AVR // mild to mod paravalvular leakage // Echo 9/21: EF 55-60, no RWMA, mild LVH, GR 2 DD, mildly reduced RVSF, RVSP 38.7, moderate LAE, mild RAE, AVR with mean gradient 11 mmHg, mild paravalvular leakage   Arthritis    Breast cancer (HCC) 2007   right   Cataract    Constipation    Dysrhythmia    A-fib   Hiatal hernia    pt denies   Hypercholesteremia    Hypertension    Hypoglycemia    Osteopenia    Personal history of radiation therapy    Tingling    Ulcer 2012   bleeding gastric Ulcer    Past Surgical History:  Procedure Laterality Date   AORTIC VALVE REPLACEMENT N/A 11/16/2014   Procedure: AORTIC VALVE REPLACEMENT (AVR);  Surgeon: Alleen Borne, MD;  Location: [facility] OR;  Service: Open Heart Surgery;  Laterality: N/A;   BIOPSY  12/15/2017   Procedure: BIOPSY;  Surgeon: Meridee Score Netty Starring., MD;  Location: [facility] ENDOSCOPY;  Service: Gastroenterology;;   BIOPSY  01/10/2021   Procedure: BIOPSY;  Surgeon: Lemar Lofty., MD;  Location: [facility] ENDOSCOPY;  Service: Gastroenterology;;   BREAST BIOPSY     BREAST LUMPECTOMY Right    2005?   BREAST SURGERY     BRONCHIAL BIOPSY  03/06/2020   Procedure: BRONCHIAL BIOPSIES;  Surgeon: Leslye Peer, MD;  Location: [facility] ENDOSCOPY;  Service: Pulmonary;;   BRONCHIAL BRUSHINGS  03/06/2020   Procedure: BRONCHIAL BRUSHINGS;  Surgeon: Leslye Peer, MD;  Location: [facility] ENDOSCOPY;  Service: Pulmonary;;   BRONCHIAL NEEDLE ASPIRATION BIOPSY  03/06/2020   Procedure: BRONCHIAL NEEDLE ASPIRATION BIOPSIES;  Surgeon: Leslye Peer, MD;  Location: [facility] ENDOSCOPY;  Service: Pulmonary;;   BRONCHIAL WASHINGS  03/06/2020   Procedure: BRONCHIAL WASHINGS;  Surgeon: Leslye Peer, MD;  Location: [facility] ENDOSCOPY;  Service: Pulmonary;;   CARDIAC CATHETERIZATION N/A 10/18/2014   Procedure: Right/Left Heart Cath and Coronary Angiography;  Surgeon: Tonny Bollman, MD;  Location: [facility] INVASIVE CV LAB;  Service:  Cardiovascular;  Laterality: N/A;   CARDIOVERSION N/A 12/09/2019   Procedure: CARDIOVERSION;  Surgeon: Wendall Stade, MD;  Location: [facility] ENDOSCOPY;  Service: Cardiovascular;  Laterality: N/A;   CARDIOVERSION N/A 12/30/2019   Procedure: CARDIOVERSION;  Surgeon: Wendall Stade, MD;  Location: [facility] ENDOSCOPY;  Service: Cardiovascular;  Laterality: N/A;   COLONOSCOPY WITH PROPOFOL N/A 01/10/2021   Procedure: COLONOSCOPY WITH PROPOFOL;  Surgeon: Lemar Lofty., MD;  Location: [facility] ENDOSCOPY;  Service: Gastroenterology;  Laterality: N/A;   CORONARY ANGIOGRAPHY N/A 02/04/2019   Procedure: CORONARY ANGIOGRAPHY;  Surgeon: Yvonne Kendall, MD;  Location: [facility] INVASIVE CV LAB;  Service: Cardiovascular;  Laterality: N/A;   COSMETIC SURGERY     face   ESOPHAGOGASTRODUODENOSCOPY (EGD) WITH PROPOFOL N/A 12/15/2017  Procedure: ESOPHAGOGASTRODUODENOSCOPY (EGD) WITH PROPOFOL;  Surgeon: Meridee Score Netty Starring., MD;  Location: [facility] ENDOSCOPY;  Service: Gastroenterology;  Laterality: N/A;   FIDUCIAL MARKER PLACEMENT  03/06/2020   Procedure: FIDUCIAL MARKER PLACEMENT;  Surgeon: Leslye Peer, MD;  Location: [facility] ENDOSCOPY;  Service: Pulmonary;;   PARATHYROIDECTOMY     TEE WITHOUT CARDIOVERSION N/A 11/16/2014   Procedure: TRANSESOPHAGEAL ECHOCARDIOGRAM (TEE);  Surgeon: Alleen Borne, MD;  Location: [facility] OR;  Service: Open Heart Surgery;  Laterality: N/A;   TEE WITHOUT CARDIOVERSION N/A 12/09/2019   Procedure: TRANSESOPHAGEAL ECHOCARDIOGRAM (TEE);  Surgeon: Wendall Stade, MD;  Location: [facility] ENDOSCOPY;  Service: Cardiovascular;  Laterality: N/A;   TONSILLECTOMY     VIDEO BRONCHOSCOPY WITH ENDOBRONCHIAL NAVIGATION Right 03/06/2020   Procedure: VIDEO BRONCHOSCOPY WITH ENDOBRONCHIAL NAVIGATION;  Surgeon: Leslye Peer, MD;  Location: [facility] ENDOSCOPY;  Service: Pulmonary;  Laterality: Right;    Review of systems negative except as noted in HPI / PMHx or noted below:  Review of Systems  Constitutional: Negative.   HENT:  Negative.    Eyes: Negative.   Respiratory: Negative.    Cardiovascular: Negative.   Gastrointestinal: Negative.   Genitourinary: Negative.   Musculoskeletal: Negative.   Skin: Negative.   Neurological: Negative.   Endo/Heme/Allergies: Negative.   Psychiatric/Behavioral: Negative.       Objective:   Vitals:   10/21/22 0923  BP: 118/76  Pulse: 84  Resp: 16  Temp: 98 F (36.7 C)  SpO2: 96%   Height: 5' 4.57" (164 cm)  Weight: 159 lb (72.1 kg)   Physical Exam Constitutional:      Appearance: She is not diaphoretic.  HENT:     Head: Normocephalic.     Right Ear: Ear canal and external ear normal.     Left Ear: Ear canal and external ear normal.     Ears:     Comments: Bilateral hearing aids    Nose: Nose normal. No mucosal edema or rhinorrhea.     Mouth/Throat:     Pharynx: Uvula midline. No oropharyngeal exudate.  Eyes:     Conjunctiva/sclera: Conjunctivae normal.  Neck:     Thyroid: No thyromegaly.     Trachea: Trachea normal. No tracheal tenderness or tracheal deviation.  Cardiovascular:     Rate and Rhythm: Normal rate and regular rhythm.     Heart sounds: Normal heart sounds, S1 normal and S2 normal. No murmur heard. Pulmonary:     Effort: No respiratory distress.     Breath sounds: Normal breath sounds. No stridor. No wheezing or rales.  Lymphadenopathy:     Head:     Right side of head: No tonsillar adenopathy.     Left side of head: No tonsillar adenopathy.     Cervical: No cervical adenopathy.  Skin:    Findings: No erythema or rash.     Nails: There is no clubbing.  Neurological:     Mental Status: She is alert.     Diagnostics: none  Assessment and Plan:   1. Perennial allergic rhinitis   2. Nonallergic rhinitis    1. Can use one of the following:  A.  Ryaltris - 2 sprays each nostril 1-2 times per day (specialty pharmacy)  2. IF NEEDED:   A. Ipratropium 0.06% - 2 sprays each nostril every 6 hours to dry nose  B. Levocetirizine 5 mg  - 1 tablet 1 time per day  3. Return to clinic in 1 year  4. Plan for fall flu vaccine  Shakiah is doing pretty well and she can decide on the dose of Ryaltris that gives her the best improvement regarding her rhinitis and she can continue to use nasal ipratropium and antihistamines as noted above.  I will see her back in this clinic in 1 year or earlier if there is a problem.  Laurette Schimke, MD Allergy / Immunology [facility]

## 2022-10-21 NOTE — Telephone Encounter (Signed)
Last BMP result discussed with patient. Patient reports she has been only taking spironolactone 12.5 mg daily with lasix 20 mg daily and stopped potassium supplement since she went on spironolactone. Her potassium level was WNL so advised her to stay off of potassium and continue with spironolactone 12.5 mg daily, Jardiance 10 mg every other day and furosemide 20 mg daily  Follow up appointment with pharm D is on July 17 at 10:30

## 2022-10-22 ENCOUNTER — Encounter: Payer: Self-pay | Admitting: Allergy and Immunology

## 2022-10-22 ENCOUNTER — Encounter: Payer: Self-pay | Admitting: Cardiovascular Disease

## 2022-11-03 ENCOUNTER — Other Ambulatory Visit: Payer: Self-pay | Admitting: Gastroenterology

## 2022-11-10 NOTE — Progress Notes (Unsigned)
Patient ID: [name]                 DOB: Oct 09, 1932                      MRN: [id number]     HPI: [name] is a 89 y.o. female referred by Dr. Excell Seltzer to pharmacy clinic for HF medication management. PMH is significant for CAD s/p NSTEMI 01/2019 with total occlusion of distal LAD, afib, diastolic HF, aortic stenosis s/p bioprosthetic AVR, HTN, HLD, and breast cancer. Most recent LVEF 60-65% on 09/16/22 echo.  She saw Dr Excell Seltzer most recently on 09/19/22 where she reported edema, SOB, and fatigue and was requiring more furosemide to maintain her weight despite no changes in diet or sodium intake. She was started on Jardiance 10mg  daily and referred to PharmD with plan to cut back on furosemide and add spironolactone. She sent in a message 2 weeks later reporting leg pain/cramps and difficulty walking after 2-3 days of taking Jardiance. She changed her Jardiance to every other day dosing.  Pt saw PharmD on 10/09/22 for follow up. Weight was down 5 lbs, BP stable. Euvolemic on furosemide 20mg  daily and Jardiance 10mg  every other day. She was advised to take Jardiance daily and could cut furosemide to 1/2 tablet daily if needed. Pt reported muscle cramps on Jardiance daily, switched back to every other day. She was started on spironolactone 12.5mg  daily, potassium supplement was discontinued. Pt sent in MyChart message on 6/26 reporting overdiuresis and leg cramps. She was advised to stop her Lasix and continue on Jardiance and spironolactone. She followed up on 7/3 reporting erratic BP and HR and not feeling well overall. She preferred to go back to her old drug regimen of furosemide 20mg  daily and stop the spironolactone. Jardiance every other day was continued.  Pt feels well on current regimen today. Some SOB when walking up an incline but otherwise stable. Main concern is stress from being the primary caretaker for her 65 year old husband who is declining cognitively. Reported urinating too  much on daily Jardiance, was in the bathroom every 2 hours. Felt unwell on spironolactone. BP is a bit soft today. Takes an extra Lasix for 2 days if she gains a lb in a day for no reason which resolves her swelling.  Current CHF meds:  Toprol 25mg  daily Jardiance 10mg  every other day Furosemide 20mg  daily  Previously tried:  Jardiance 10mg  daily - leg cramps, overdiuresis Spironolactone 12.5mg  daily - leg cramps, overdiuresis  BP goal: <130/59mmHg  Family History: Cancer (age of onset: 74) in her cousin; Colon cancer in her mother; Hypertension in her mother; Kidney disease in her father; Rectal cancer in her mother.   Social History: Former smoker 1/2 PPD x 15 years, quit in 1986. Glass of wine nightly with dinner. No drug use.   Diet: Low sodium.  Exercise:   Home BP readings:   Wt Readings from Last 3 Encounters:  10/21/22 159 lb (72.1 kg)  10/14/22 160 lb 12.8 oz (72.9 kg)  10/09/22 156 lb (70.8 kg)   BP Readings from Last 3 Encounters:  10/21/22 118/76  10/14/22 133/80  10/09/22 128/78   Pulse Readings from Last 3 Encounters:  10/21/22 84  10/14/22 96  10/09/22 77    Renal function: CrCl cannot be calculated (Patient's most recent lab result is older than the maximum 21 days allowed.).  Past Medical History:  Diagnosis Date   Allergy  Anemia    Anxiety    Aortic valve stenosis, severe    s/p AVR // mild to mod paravalvular leakage // Echo 9/21: EF 55-60, no RWMA, mild LVH, GR 2 DD, mildly reduced RVSF, RVSP 38.7, moderate LAE, mild RAE, AVR with mean gradient 11 mmHg, mild paravalvular leakage   Arthritis    Breast cancer (HCC) 2007   right   Cataract    Constipation    Dysrhythmia    A-fib   Hiatal hernia    pt denies   Hypercholesteremia    Hypertension    Hypoglycemia    Osteopenia    Personal history of radiation therapy    Tingling    Ulcer 2012   bleeding gastric Ulcer    Current Outpatient Medications on File Prior to Visit   Medication Sig Dispense Refill   acetaminophen (TYLENOL) 500 MG tablet Take 500-1,000 mg by mouth every 6 (six) hours as needed for mild pain or moderate pain.      alendronate (FOSAMAX) 70 MG tablet Take 1 tablet (70 mg total) by mouth every 7 (seven) days. Take with a full glass of water on an empty stomach. 12 tablet 3   ALPRAZolam (XANAX) 0.5 MG tablet TAKE 1 TABLET TWICE DAILY AS NEEDED FOR AN ANXIETY, DO NOT TAKE AT NIGHT. 180 tablet 0   apixaban (ELIQUIS) 5 MG TABS tablet Take 1 tablet (5 mg total) by mouth 2 (two) times daily. 180 tablet 2   Calcium Carb-Cholecalciferol 600-400 MG-UNIT TABS Take 1 tablet by mouth daily.     Chloral Hydrate CRYS Take 10 mls by mouth at bedtime as needed 900 g 2   Cholecalciferol (VITAMIN D-3) 1000 UNITS CAPS Take 1,000 Units by mouth daily.      clindamycin (CLEOCIN) 300 MG capsule Per patient taking prior to dental appointment     desonide (DESOWEN) 0.05 % cream  (Patient not taking: Reported on 10/21/2022)     empagliflozin (JARDIANCE) 10 MG TABS tablet Take 1 tablet (10 mg total) by mouth every other day. 30 tablet 3   ferrous sulfate 325 (65 FE) MG tablet Take 325 mg by mouth 2 (two) times a week. Every Monday and Wednesday     ipratropium (ATROVENT) 0.06 % nasal spray 2 sprays each nostril every 6 hours to dry nose 45 mL 2   levocetirizine (XYZAL) 5 MG tablet Take 1 tablet (5 mg total) by mouth daily as needed (Can take an extra dose during flare ups). 180 tablet 3   Magnesium Oxide 250 MG TABS Take by mouth.     metoprolol succinate (TOPROL XL) 25 MG 24 hr tablet Take 1 tablet (25 mg total) by mouth daily. 90 tablet 3   nitroGLYCERIN (NITROSTAT) 0.4 MG SL tablet Place 1 tablet (0.4 mg total) under the tongue every 5 (five) minutes as needed for chest pain. 30 tablet 0   pantoprazole (PROTONIX) 40 MG tablet TAKE ONE TABLET BY MOUTH TWICE DAILY 180 tablet 3   Polyethyl Glycol-Propyl Glycol (SYSTANE ULTRA) 0.4-0.3 % SOLN Place 1 drop into both eyes daily  as needed (dry eyes).     rosuvastatin (CRESTOR) 20 MG tablet Take 1 tablet (20 mg total) by mouth daily. 90 tablet 3   RYALTRIS 665-25 MCG/ACT SUSP 2 sprays per nostril 1-2 times daily 29 g 5   spironolactone (ALDACTONE) 25 MG tablet Take 0.5 tablets (12.5 mg total) by mouth daily. 90 tablet 3   tretinoin (RETIN-A) 0.05 % cream Apply topically.     [  DISCONTINUED] nitroGLYCERIN (NITROSTAT) 0.4 MG SL tablet Place 1 tablet (0.4 mg total) under the tongue every 5 (five) minutes as needed for chest pain (CP or SOB). 25 tablet 3   No current facility-administered medications on file prior to visit.    Allergies  Allergen Reactions   Covid-19 (Mrna) Vaccine     fever   Lactose Intolerance (Gi) Diarrhea and Nausea Only    GI upset   Amoxicillin-Pot Clavulanate Hives and Rash   Amoxicillin-Pot Clavulanate Rash   Penicillin G Benzathine Rash   Penicillins Hives and Rash    30 years   Sulfa Antibiotics Hives and Rash   Sulfonamide Derivatives Hives and Rash     Assessment/Plan:  1. HFpEF - EF 60-65% on 08/2022 echo, pt with diastolic dysfunction. Did not tolerate daily Jardiance or low dose spironolactone secondary to leg cramps and overdiuresis. She feels well on current regimen of Jardiance 10mg  every other day and Lasix 20mg  daily. Also takes Toprol 25mg  daily. Discussed cardiac data behind SGLT2i and MRA in diastolic HF vs symptom management with Lasix. She prefers to continue on current regimen as she feels the best on it, will continue meds as noted above. BP at goal today. She sees her PCP soon for follow up and will discuss ongoing stress from caring for her husband who is declining cognitively. She will keep f/u with Dr Excell Seltzer in November.  Chistian Kasler E. Edda Orea, PharmD, BCACP, CPP [facility] [address], [place], [place] [address] Phone: [phone]; Fax: [phone] 11/12/2022 11:02 AM

## 2022-11-12 ENCOUNTER — Telehealth: Payer: Self-pay | Admitting: Allergy and Immunology

## 2022-11-12 ENCOUNTER — Ambulatory Visit: Payer: PPO | Attending: Cardiology | Admitting: Pharmacist

## 2022-11-12 VITALS — BP 106/62 | HR 90 | Wt 160.0 lb

## 2022-11-12 DIAGNOSIS — I5032 Chronic diastolic (congestive) heart failure: Secondary | ICD-10-CM | POA: Diagnosis not present

## 2022-11-12 NOTE — Patient Instructions (Signed)
Your blood pressure is well controlled  Continue taking your current medications  Talk with your primary care doctor about options to help manage anxiety/stress. Buspirone can be used as well as benzodiazepines, but she will likely have better options for you

## 2022-11-12 NOTE — Telephone Encounter (Signed)
Patient would like to know if she needs to continue using Ryaltris. It was prescribed to her at her last visit where she was battling an infection. Patient's infection has cleared up so she would like to know if she needs to continue it.   Best contact number: [phone]

## 2022-11-28 ENCOUNTER — Ambulatory Visit (HOSPITAL_BASED_OUTPATIENT_CLINIC_OR_DEPARTMENT_OTHER)
Admission: RE | Admit: 2022-11-28 | Discharge: 2022-11-28 | Disposition: A | Discharge status: Home / Self Care | Payer: PPO | Source: Ambulatory Visit | Attending: Radiation Oncology | Admitting: Radiation Oncology

## 2022-11-28 DIAGNOSIS — C3411 Malignant neoplasm of upper lobe, right bronchus or lung: Secondary | ICD-10-CM | POA: Diagnosis not present

## 2022-11-28 LAB — POCT I-STAT CREATININE: Creatinine, Ser: 1 mg/dL (ref 0.44–1.00)

## 2022-11-28 MED ORDER — IOHEXOL 300 MG/ML  SOLN
100.0000 mL | Freq: Once | INTRAMUSCULAR | Status: AC | PRN
  Administered 2022-11-28: 100 mL via INTRAVENOUS

## 2022-12-01 ENCOUNTER — Telehealth: Payer: Self-pay | Admitting: *Deleted

## 2022-12-01 ENCOUNTER — Ambulatory Visit: Payer: PPO | Admitting: Radiation Oncology

## 2022-12-01 NOTE — Telephone Encounter (Signed)
CALLED PATIENT TO ASK ABOUT CHANGING TELEPHONE FU TO 12-03-22 DUE TO RESULTS NOT BEING READY, PATIENT AGREED TO RECEIVE A PHONE CALL ON 12-03-22 @ 4:15 PM

## 2022-12-03 ENCOUNTER — Ambulatory Visit: Admission: RE | Admit: 2022-12-03 | Payer: PPO | Source: Ambulatory Visit | Admitting: Radiation Oncology

## 2022-12-04 ENCOUNTER — Telehealth: Payer: Self-pay | Admitting: Radiation Oncology

## 2022-12-04 DIAGNOSIS — C3411 Malignant neoplasm of upper lobe, right bronchus or lung: Secondary | ICD-10-CM

## 2022-12-04 NOTE — Telephone Encounter (Signed)
I spoke with the patient and we reviewed her scan by phone today. Fortunately her RUL target appears stable without concerns of active disease. There are clustered nodules in the left lower lobe and the largest is 9 mm, and appears inflammatory in nature to the radiologist. We discussed that she has ongoing allergic rhinitis with constant postnasal drip. She is seeing an allergist and this is being treated symptomatically. With these symptoms, and history of chronic cough, we will follow up with a repeat CT scan of the chest in 3 months. She is in agreement with this plan and cancel her appt on 12/08/22 since we are discussing this today.

## 2022-12-08 ENCOUNTER — Ambulatory Visit: Payer: PPO | Admitting: Radiation Oncology

## 2022-12-15 ENCOUNTER — Encounter: Payer: Self-pay | Admitting: Cardiovascular Disease

## 2022-12-18 MED ORDER — FUROSEMIDE 20 MG PO TABS: ORAL_TABLET | ORAL | 3 refills | Status: DC

## 2022-12-18 NOTE — Telephone Encounter (Signed)
I'm ok with her going back to daily lasix at previous dose with additional dosing as needed for weight gain.

## 2022-12-18 NOTE — Telephone Encounter (Signed)
Returned call to patient to make her aware that Dr Excell Seltzer is fine with her stopping Jardiance and returning to Furosemide. Verified dose with her and she agrees 20mg  daily and will increase as needed to manage weight gain. New prescription sent to pharmacy at this time. Jardiance removed from [facility]. Per MAR on 09/19/22 (last visit w/[facility]):  Furosemide 20 MG Take 1 tablet by mouth daily. If greater than 3 pound gain in 24 hours, increase to 2 tablets daily until weight returns to normal

## 2022-12-23 ENCOUNTER — Telehealth: Payer: Self-pay | Admitting: *Deleted

## 2022-12-23 NOTE — Telephone Encounter (Signed)
CALLED PATIENT TO INFORM OF CT FOR 03-06-23- ARRIVAL TIME- 10:45 AM @ [facility],NO RESTRICTIONS TO TEST, PATIENT TO RECEIVE RESULTS FROM ALISON PERKINS VIA TELEPHONE ON 03-11-23 @ 8:30 AM, SPOKE WITH PATIENT AND SHE IS AWARE OF THESE APPTS. AND THE INSTRUCTIONS

## 2023-01-02 ENCOUNTER — Other Ambulatory Visit: Payer: Self-pay

## 2023-01-02 DIAGNOSIS — I4821 Permanent atrial fibrillation: Secondary | ICD-10-CM

## 2023-01-02 MED ORDER — APIXABAN 5 MG PO TABS
5.0000 mg | ORAL_TABLET | Freq: Two times a day (BID) | ORAL | 1 refills | Status: DC
Start: 2023-01-02 — End: 2023-01-14

## 2023-01-02 NOTE — Telephone Encounter (Signed)
Prescription refill request for Eliquis received. Indication: Afib  Last office visit: 09/19/22 Excell Seltzer)  Scr: 1.00 (11/28/22)  Age: 89 Weight: 72.6kg  Appropriate dose. Refill sent.

## 2023-01-06 ENCOUNTER — Other Ambulatory Visit: Payer: Self-pay | Admitting: Cardiovascular Disease

## 2023-01-13 ENCOUNTER — Other Ambulatory Visit: Payer: Self-pay | Admitting: Cardiovascular Disease

## 2023-01-13 DIAGNOSIS — I4821 Permanent atrial fibrillation: Secondary | ICD-10-CM

## 2023-01-14 ENCOUNTER — Encounter: Payer: Self-pay | Admitting: Cardiovascular Disease

## 2023-01-14 ENCOUNTER — Telehealth: Payer: Self-pay | Admitting: Cardiovascular Disease

## 2023-01-14 DIAGNOSIS — I4821 Permanent atrial fibrillation: Secondary | ICD-10-CM

## 2023-01-14 MED ORDER — APIXABAN 5 MG PO TABS
5.0000 mg | ORAL_TABLET | Freq: Two times a day (BID) | ORAL | 5 refills | Status: DC
Start: 2023-01-14 — End: 2023-01-14

## 2023-01-14 MED ORDER — APIXABAN 5 MG PO TABS: 5.0000 mg | ORAL_TABLET | Freq: Two times a day (BID) | ORAL | 5 refills | Status: DC

## 2023-01-14 NOTE — Telephone Encounter (Signed)
Prescription refill request for Eliquis received. Indication: Afib  Last office visit: 65/24/24([facility])  Scr: 1.00 (11/28/22)  Age: 89 Weight: 72.6kg  Appropriate dose. Refill sent.

## 2023-01-14 NOTE — Telephone Encounter (Signed)
*  STAT* If patient is at the pharmacy, call can be transferred to refill team.   1. Which medications need to be refilled? (please list name of each medication and dose if known) apixaban (ELIQUIS) 5 MG TABS tablet   2. Which pharmacy/location (including street and city if local pharmacy) is medication to be sent to?  [facility] [place], [place] - [address]    3. Do they need a 30 day or 90 day supply? 30  Pharmacy states that they need this sent to them and not mail order pharmacy.

## 2023-01-27 MED ORDER — EMPAGLIFLOZIN 10 MG PO TABS: 10.0000 mg | ORAL_TABLET | ORAL | 11 refills | Status: DC

## 2023-01-27 NOTE — Addendum Note (Signed)
Addended by: Lars Mage on: 01/27/2023 10:03 AM   Modules accepted: Orders

## 2023-02-02 ENCOUNTER — Other Ambulatory Visit: Payer: Self-pay | Admitting: Cardiovascular Disease

## 2023-02-10 ENCOUNTER — Other Ambulatory Visit (HOSPITAL_BASED_OUTPATIENT_CLINIC_OR_DEPARTMENT_OTHER): Payer: Self-pay

## 2023-02-10 MED ORDER — FLUAD 0.5 ML IM SUSY
0.5000 mL | PREFILLED_SYRINGE | Freq: Once | INTRAMUSCULAR | 0 refills | Status: AC
  Filled 2023-02-10: qty 0.5, 1d supply, fill #0

## 2023-02-12 ENCOUNTER — Other Ambulatory Visit: Payer: Self-pay | Admitting: Internal Medicine

## 2023-02-16 MED ORDER — DAPAGLIFLOZIN PROPANEDIOL 10 MG PO TABS: 10.0000 mg | ORAL_TABLET | ORAL | 3 refills | Status: DC

## 2023-02-16 NOTE — Telephone Encounter (Signed)
Spoke with Dr Excell Seltzer during clinic who gave verbal okay to switch pt from Jardiance to [place] 10mg  for cost savings. Sent to [facility] pharmacy at this time per her request. Pt made aware via MyChart.

## 2023-02-16 NOTE — Addendum Note (Signed)
Addended by: Lars Mage on: 02/16/2023 11:08 AM   Modules accepted: Orders

## 2023-03-06 ENCOUNTER — Ambulatory Visit (HOSPITAL_BASED_OUTPATIENT_CLINIC_OR_DEPARTMENT_OTHER)
Admission: RE | Admit: 2023-03-06 | Discharge: 2023-03-06 | Disposition: A | Discharge status: Home / Self Care | Payer: PPO | Source: Ambulatory Visit | Attending: Radiation Oncology | Admitting: Radiation Oncology

## 2023-03-06 DIAGNOSIS — C3411 Malignant neoplasm of upper lobe, right bronchus or lung: Secondary | ICD-10-CM | POA: Insufficient documentation

## 2023-03-06 MED ORDER — IOHEXOL 300 MG/ML  SOLN
100.0000 mL | Freq: Once | INTRAMUSCULAR | Status: AC | PRN
  Administered 2023-03-06: 75 mL via INTRAVENOUS

## 2023-03-09 LAB — POCT I-STAT CREATININE: Creatinine, Ser: 0.9 mg/dL (ref 0.44–1.00)

## 2023-03-10 ENCOUNTER — Encounter: Payer: Self-pay | Admitting: Radiation Oncology

## 2023-03-10 ENCOUNTER — Telehealth: Payer: Self-pay | Admitting: Radiation Oncology

## 2023-03-10 NOTE — Progress Notes (Signed)
Telephone nursing appointment for review of most recent CT scan from 03/06/2023. I verified patient's identity x2 and began nursing interview.   Patient reports doing well. Patient denies any other related issues at this time.   Meaningful use complete.   Patient aware of their 8:30am telephone appointment w/ Laurence Aly PA-C. I left my extension [phone] in case patient needs anything. Patient verbalized understanding. This concludes the nursing interview.   Patient contact [phone]. Patient requests to have daughter Toniann Fail' in on the call. [phone]     Ruel Favors, LPN

## 2023-03-10 NOTE — Telephone Encounter (Signed)
11/12 @ 11:58 am Patient left voicemail to confirm if her daughter can be on conference call on tomorrow for her appointment with Jill Side.  Called left voicemail with Darryl Nestle, so they are aware.

## 2023-03-11 ENCOUNTER — Encounter: Payer: Self-pay | Admitting: Radiation Oncology

## 2023-03-11 ENCOUNTER — Ambulatory Visit
Admission: RE | Admit: 2023-03-11 | Discharge: 2023-03-11 | Disposition: A | Discharge status: Home / Self Care | Payer: PPO | Source: Ambulatory Visit | Attending: Radiation Oncology | Admitting: Radiation Oncology

## 2023-03-11 DIAGNOSIS — C3411 Malignant neoplasm of upper lobe, right bronchus or lung: Secondary | ICD-10-CM

## 2023-03-11 NOTE — Progress Notes (Addendum)
Radiation Oncology         [phone] ________________________________  Initial Outpatient Consultation - Conducted via telephone at patient request.  I spoke with the patient to conduct this  visit via telephone. The patient was notified in advance and was offered an in person or telemedicine meeting to allow for face to face communication but instead preferred to proceed with a telephone visit.   Name: [name]        MRN: [id number]  Date of Service: 03/11/2023 DOB: 1932-05-01  ZH:[id number], Bobette Mo, MD  Pincus Sanes, MD     REFERRING PHYSICIAN: Pincus Sanes, MD   DIAGNOSIS: The encounter diagnosis was Malignant neoplasm of upper lobe of right lung [facility]).   HISTORY OF PRESENT ILLNESS: [name] is a 89 y.o. female with a history of Stage IA2, cT1bN0M0, NSCLC, adenocarcinoma of the RUL.  The patient has a history of right breast cancer treated in [place], [place] with lumpectomy and adjuvant radiotherapy in 2006. She also has a history of valvular disease and underwent an aortic valve replacement in July 2016 by Dr. Sherie Don, as well as atrial fibrillation and diastolic CHF. The patient has been followed by pulmonary medicine for a right upper lobe pulmonary nodule that was originally identified in May 2018, notes indicate that there had been a chest wall lipoma and this CT scan was performed due to this work-up. Over time, though it was unchanged in overall size there was new solid component in the medial aspect measuring up to 4 mm, no evidence of adenopathy was identified, and she was counseled on the rationale for bronchoscopy which was performed on 03/06/2020. Biopsies and cytology of the right upper lobe from fine-needle aspiration and brushing were consistent with malignant cells morphologically consistent with an adenocarcinoma.  Atypical cells were also seen in the right upper lobe lavage.  She did undergo PET imaging on 03/30/2020, the right upper lobe nodule seen  previously by CT showed an SUV max of 1.9, associated fiducial markers were seen, and no additional hypermetabolic changes were identified in the lung or within the lymph node regions.  No other hypermetabolic activity was noted throughout the body.    She proceeded with stereotactic body radiotherapy (SBRT) in 3 fractions which she completed to the RUL target in December 2021. She's been followed in surveillance since. Interestingly her surveilance scans have identified pneumatosis of the colon. She underwent work up with additional imaging and met with Dr. Meridee Score regarding these findings. A Cologuard was positive but at the time of colonoscopy in September 2022 she had focal changes of the bowel wall consistent with her pneumatosis and a transverse polyp that was consistent with tubular adenoma. Dr. Meridee Score does not recommend repeat endoscopy.   While she has had some inflammatory findings at or after infections in the lungs, she has been stable from an oncologic perspective without recurrences. She does have chronic  pneumatosis in the splenic flexure of the colon. Her most recent scan on 03/06/23 showed stable post treatment changes in the right upper lung. There was cardiomegaly, evidence of atherosclerotic disease, and prior atrial valve replacement. Persistent changes in the LLL originally in March 2024 called new tree in bud changes and then in August 2024 called this site as a 9 mm clustered group of pulmonary nodules favored to be inflammatory. Her scan this time measured the area at 1 cm and again favored to be atypical infection. She is contacted by phone to review these  results.    PREVIOUS RADIATION THERAPY:    04/18/20-04/26/20 SBRT Treatment: The RUL target was treated to 54 Gy in 3 fractions  About 2006: The patient received adjuvant right breast radiotherapy. Details are not known at this point about her dosing.   PAST MEDICAL HISTORY:  Past Medical History:  Diagnosis Date    Allergy    Anemia    Anxiety    Aortic valve stenosis, severe    s/p AVR // mild to mod paravalvular leakage // Echo 9/21: EF 55-60, no RWMA, mild LVH, GR 2 DD, mildly reduced RVSF, RVSP 38.7, moderate LAE, mild RAE, AVR with mean gradient 11 mmHg, mild paravalvular leakage   Arthritis    Breast cancer (HCC) 2007   right   Cataract    Constipation    Dysrhythmia    A-fib   Hiatal hernia    pt denies   Hypercholesteremia    Hypertension    Hypoglycemia    Osteopenia    Personal history of radiation therapy    Tingling    Ulcer 2012   bleeding gastric Ulcer       PAST SURGICAL HISTORY: Past Surgical History:  Procedure Laterality Date   AORTIC VALVE REPLACEMENT N/A 11/16/2014   Procedure: AORTIC VALVE REPLACEMENT (AVR);  Surgeon: Alleen Borne, MD;  Location: [facility] OR;  Service: Open Heart Surgery;  Laterality: N/A;   BIOPSY  12/15/2017   Procedure: BIOPSY;  Surgeon: Meridee Score Netty Starring., MD;  Location: [facility] ENDOSCOPY;  Service: Gastroenterology;;   BIOPSY  01/10/2021   Procedure: BIOPSY;  Surgeon: Lemar Lofty., MD;  Location: [facility] ENDOSCOPY;  Service: Gastroenterology;;   BREAST BIOPSY     BREAST LUMPECTOMY Right    2005?   BREAST SURGERY     BRONCHIAL BIOPSY  03/06/2020   Procedure: BRONCHIAL BIOPSIES;  Surgeon: Leslye Peer, MD;  Location: [facility] ENDOSCOPY;  Service: Pulmonary;;   BRONCHIAL BRUSHINGS  03/06/2020   Procedure: BRONCHIAL BRUSHINGS;  Surgeon: Leslye Peer, MD;  Location: [facility] ENDOSCOPY;  Service: Pulmonary;;   BRONCHIAL NEEDLE ASPIRATION BIOPSY  03/06/2020   Procedure: BRONCHIAL NEEDLE ASPIRATION BIOPSIES;  Surgeon: Leslye Peer, MD;  Location: [facility] ENDOSCOPY;  Service: Pulmonary;;   BRONCHIAL WASHINGS  03/06/2020   Procedure: BRONCHIAL WASHINGS;  Surgeon: Leslye Peer, MD;  Location: [facility] ENDOSCOPY;  Service: Pulmonary;;   CARDIAC CATHETERIZATION N/A 10/18/2014   Procedure: Right/Left Heart Cath and Coronary Angiography;  Surgeon: Tonny Bollman, MD;   Location: [facility] INVASIVE CV LAB;  Service: Cardiovascular;  Laterality: N/A;   CARDIOVERSION N/A 12/09/2019   Procedure: CARDIOVERSION;  Surgeon: Wendall Stade, MD;  Location: [facility] ENDOSCOPY;  Service: Cardiovascular;  Laterality: N/A;   CARDIOVERSION N/A 12/30/2019   Procedure: CARDIOVERSION;  Surgeon: Wendall Stade, MD;  Location: [facility] ENDOSCOPY;  Service: Cardiovascular;  Laterality: N/A;   COLONOSCOPY WITH PROPOFOL N/A 01/10/2021   Procedure: COLONOSCOPY WITH PROPOFOL;  Surgeon: Lemar Lofty., MD;  Location: [facility] ENDOSCOPY;  Service: Gastroenterology;  Laterality: N/A;   CORONARY ANGIOGRAPHY N/A 02/04/2019   Procedure: CORONARY ANGIOGRAPHY;  Surgeon: Yvonne Kendall, MD;  Location: [facility] INVASIVE CV LAB;  Service: Cardiovascular;  Laterality: N/A;   COSMETIC SURGERY     face   ESOPHAGOGASTRODUODENOSCOPY (EGD) WITH PROPOFOL N/A 12/15/2017   Procedure: ESOPHAGOGASTRODUODENOSCOPY (EGD) WITH PROPOFOL;  Surgeon: Meridee Score Netty Starring., MD;  Location: [facility] ENDOSCOPY;  Service: Gastroenterology;  Laterality: N/A;   FIDUCIAL MARKER PLACEMENT  03/06/2020   Procedure: FIDUCIAL MARKER PLACEMENT;  Surgeon: Levy Pupa  S, MD;  Location: [facility] ENDOSCOPY;  Service: Pulmonary;;   PARATHYROIDECTOMY     TEE WITHOUT CARDIOVERSION N/A 11/16/2014   Procedure: TRANSESOPHAGEAL ECHOCARDIOGRAM (TEE);  Surgeon: Alleen Borne, MD;  Location: [facility] OR;  Service: Open Heart Surgery;  Laterality: N/A;   TEE WITHOUT CARDIOVERSION N/A 12/09/2019   Procedure: TRANSESOPHAGEAL ECHOCARDIOGRAM (TEE);  Surgeon: Wendall Stade, MD;  Location: [facility] ENDOSCOPY;  Service: Cardiovascular;  Laterality: N/A;   TONSILLECTOMY     VIDEO BRONCHOSCOPY WITH ENDOBRONCHIAL NAVIGATION Right 03/06/2020   Procedure: VIDEO BRONCHOSCOPY WITH ENDOBRONCHIAL NAVIGATION;  Surgeon: Leslye Peer, MD;  Location: [facility] ENDOSCOPY;  Service: Pulmonary;  Laterality: Right;     FAMILY HISTORY:  Family History  Problem Relation Age of Onset   Hypertension Mother     Rectal cancer Mother    Colon cancer Mother    Kidney disease Father    Cancer Cousin 65       breast and ovarian    Esophageal cancer Neg Hx    Stomach cancer Neg Hx    Colon polyps Neg Hx      SOCIAL HISTORY:  reports that she quit smoking about 38 years ago. Her smoking use included cigarettes. She started smoking about 53 years ago. She has a 7.5 pack-year smoking history. She has never used smokeless tobacco. She reports current alcohol use of about 7.0 standard drinks of alcohol per week. She reports that she does not use drugs.  The patient is married and lives in [place] in independent living at [facility].  She enjoys playing bridge with friends.    ALLERGIES: Covid-19 (mrna) vaccine, Lactose intolerance (gi), Amoxicillin-pot clavulanate, Amoxicillin-pot clavulanate, Penicillin g benzathine, Penicillins, Sulfa antibiotics, and Sulfonamide derivatives   MEDICATIONS:  Current Outpatient Medications  Medication Sig Dispense Refill   acetaminophen (TYLENOL) 500 MG tablet Take 500-1,000 mg by mouth every 6 (six) hours as needed for mild pain or moderate pain.      alendronate (FOSAMAX) 70 MG tablet Take 1 tablet (70 mg total) by mouth every 7 (seven) days. Take with a full glass of water on an empty stomach. 12 tablet 3   ALPRAZolam (XANAX) 0.5 MG tablet TAKE 1 TABLET TWICE DAILY AS NEEDED FOR AN ANXIETY, DO NOT TAKE AT NIGHT. 180 tablet 0   apixaban (ELIQUIS) 5 MG TABS tablet Take 1 tablet (5 mg total) by mouth 2 (two) times daily. 60 tablet 5   Calcium Carb-Cholecalciferol 600-400 MG-UNIT TABS Take 1 tablet by mouth daily.     Chloral Hydrate CRYS Take 10 mls by mouth at bedtime as needed 900 g 2   Cholecalciferol (VITAMIN D-3) 1000 UNITS CAPS Take 1,000 Units by mouth daily.      clindamycin (CLEOCIN) 300 MG capsule Per patient taking prior to dental appointment     dapagliflozin propanediol (FARXIGA) 10 MG TABS tablet Take 1 tablet (10 mg total) by mouth every other day. 45  tablet 3   ferrous sulfate 325 (65 FE) MG tablet Take 325 mg by mouth 2 (two) times a week. Every Monday and Wednesday     furosemide (LASIX) 20 MG tablet Take 1 tablet by mouth daily. If greater than 3 pound gain in 24 hours, increase to 2 tablets daily until weight returns to normal 120 tablet 3   ipratropium (ATROVENT) 0.06 % nasal spray 2 sprays each nostril every 6 hours to dry nose 45 mL 2   levocetirizine (XYZAL) 5 MG tablet Take 1 tablet (5 mg total) by mouth daily  as needed (Can take an extra dose during flare ups). 180 tablet 3   Magnesium Oxide 250 MG TABS Take by mouth.     metoprolol succinate (TOPROL XL) 25 MG 24 hr tablet Take 1 tablet (25 mg total) by mouth daily. 90 tablet 3   nitroGLYCERIN (NITROSTAT) 0.4 MG SL tablet Place 1 tablet (0.4 mg total) under the tongue every 5 (five) minutes as needed for chest pain. 30 tablet 0   pantoprazole (PROTONIX) 40 MG tablet TAKE ONE TABLET BY MOUTH TWICE DAILY 180 tablet 3   Polyethyl Glycol-Propyl Glycol (SYSTANE ULTRA) 0.4-0.3 % SOLN Place 1 drop into both eyes daily as needed (dry eyes).     rosuvastatin (CRESTOR) 20 MG tablet Take 1 tablet (20 mg total) by mouth daily. 90 tablet 3   RYALTRIS 665-25 MCG/ACT SUSP 2 sprays per nostril 1-2 times daily 29 g 5   tretinoin (RETIN-A) 0.05 % cream Apply topically.     No current facility-administered medications for this encounter.     REVIEW OF SYSTEMS: On review of systems, the patient is doing pretty well. While she denies any concerns with coughing at this time, she has had a chronic cough during our time of knowing each other, which her daughter [name] also acknowledges. She is not having any fever at this time. She denies any recent respiratory illnesses and has been seeing Dr. Lucie Leather and is finding the recommendations of taking Stinging Neddle Supplement, and Ryaltris and Ipratropium sprays her allergies are pretty well controlled. However she feels her history of severe allergies is a  contact battle since she lives in an apartment style building with a shared HVAC unit and she believes she's exposed to more allergens because of this. Despite this, her symptoms that can flare up include sinus congestion and constant nasal drip. No other complaints are verbalized.   PHYSICAL EXAM:  Unable to assess due to encounter type.   ECOG = 1  0 - Asymptomatic (Fully active, able to carry on all predisease activities without restriction)  1 - Symptomatic but completely ambulatory (Restricted in physically strenuous activity but ambulatory and able to carry out work of a light or sedentary nature. For example, light housework, office work)  2 - Symptomatic, <50% in bed during the day (Ambulatory and capable of all self care but unable to carry out any work activities. Up and about more than 50% of waking hours)  3 - Symptomatic, >50% in bed, but not bedbound (Capable of only limited self-care, confined to bed or chair 50% or more of waking hours)  4 - Bedbound (Completely disabled. Cannot carry on any self-care. Totally confined to bed or chair)  5 - Death   [name] MM, Creech RH, Tormey DC, et al. [phone]). "Toxicity and response criteria of the [facility] Group". Am. Evlyn Clines. Oncol. 5 (6): 649-55    LABORATORY DATA:  Lab Results  Component Value Date   WBC 7.1 08/10/2022   HGB 13.5 08/10/2022   HCT 41.2 08/10/2022   MCV 97.6 08/10/2022   PLT 120 (L) 08/10/2022   Lab Results  Component Value Date   NA 142 10/20/2022   K 4.4 10/20/2022   CL 105 10/20/2022   CO2 21 10/20/2022   Lab Results  Component Value Date   ALT 30 08/10/2022   AST 33 08/10/2022   ALKPHOS 83 08/10/2022   BILITOT 0.9 08/10/2022      RADIOGRAPHY: CT CHEST W CONTRAST  Result Date: 03/10/2023 CLINICAL DATA:  Non-small cell lung cancer restaging, status post SBRT * Tracking Code: BO * EXAM: CT CHEST WITH CONTRAST TECHNIQUE: Multidetector CT imaging of the chest was performed during  intravenous contrast administration. RADIATION DOSE REDUCTION: This exam was performed according to the departmental dose-optimization program which includes automated exposure control, adjustment of the mA and/or kV according to patient size and/or use of iterative reconstruction technique. CONTRAST:  75mL OMNIPAQUE IOHEXOL 300 MG/ML  SOLN COMPARISON:  11/28/2022 FINDINGS: Cardiovascular: Aortic atherosclerosis. Aortic valve prosthesis. Cardiomegaly. No pericardial effusion. Mediastinum/Nodes: No enlarged mediastinal, hilar, or axillary lymph nodes. Thyroid gland, trachea, and esophagus demonstrate no significant findings. Lungs/Pleura: Unchanged post treatment appearance of the chest with dense, bandlike suprahilar fibrosis and consolidation of the right upper lobe, containing biopsy marking clips (series 4, image 44). Interval increase in clustered centrilobular nodularity and small consolidations in the dependent superior segment left lower lobe, largest individual nodule measuring 1.0 x 0.9 cm (series 4, image 68). No pleural effusion or pneumothorax. Upper Abdomen: No acute abnormality. Unchanged pneumatosis involving the included splenic flexure of the colon (series 2, image 127). Musculoskeletal: No chest wall abnormality. No acute osseous findings. IMPRESSION: 1. Unchanged post treatment appearance of the chest with dense, bandlike suprahilar fibrosis and consolidation of the right upper lobe, containing biopsy marking clips. 2. Interval increase in clustered centrilobular nodularity and small consolidations in the dependent superior segment left lower lobe, largest individual nodule measuring 1.0 x 0.9 cm. Findings are most consistent with atypical infection, however careful attention on follow-up is warranted. 3. Unchanged benign pneumatosis involving the included splenic flexure of the colon. 4. Cardiomegaly. Aortic Atherosclerosis (ICD10-I70.0). Electronically Signed   By: Jearld Lesch M.D.   On:  03/10/2023 10:50        IMPRESSION/PLAN: 1. Stage IA2, cT1bN0M0, NSCLC, adenocarcinoma of the RUL. We discussed the reassuring findings on her most recent surveillance CT scan. We also discussed the plan for repeat scan at 3-4 months given the persistent inflammatory changes in the LLL. I would hope that with resolution or stability of the LLL finding we might then consider resuming surveillance at 6 months per NCCN Guidelines. She is in agreement with this plan and will contact me sooner if she has questions or concerns prior to her next visit. 2.  Pneumatosis of the splenic flexure. This will be followed expectantly but she understands the need to proceed with evaluation if she developed acute symptoms of pain, fever, or abrupt GI symptoms.  3. Inflammatory changes in the LLL. The patient is asymptomatic at this time, and we have followed this expectantly, however given the persistence and that by imaging this appears more locally progressive, I'll send a message to her allergist and pulmonologist to see if there is a way to assessing her for atypical infection with a minimally invasive approach.   This encounter was conducted via telephone.  The patient has provided two factor identification and has given verbal consent for this type of encounter and has been advised to only accept a meeting of this type in a secure network environment. The time spent during this encounter was 45 minutes including preparation, discussion, and coordination of the patient's care. The attendants for this meeting include  [name]  and [name] and her daughter [name].  During the encounter,  [name] was located at [facility] Radiation Oncology Department.  [name] was located at home, her daughter [name] was located remotely as well.  The above documentation reflects my direct findings during this shared patient visit. Please see the  separate note by Dr. Mitzi Hansen on this date for the remainder of the patient's plan of care.    Osker Mason, PAC

## 2023-03-12 ENCOUNTER — Other Ambulatory Visit: Payer: Self-pay | Admitting: Cardiovascular Disease

## 2023-03-12 DIAGNOSIS — T8203XD Leakage of heart valve prosthesis, subsequent encounter: Secondary | ICD-10-CM

## 2023-03-12 DIAGNOSIS — I4821 Permanent atrial fibrillation: Secondary | ICD-10-CM

## 2023-03-12 DIAGNOSIS — I5032 Chronic diastolic (congestive) heart failure: Secondary | ICD-10-CM

## 2023-03-12 DIAGNOSIS — I2510 Atherosclerotic heart disease of native coronary artery without angina pectoris: Secondary | ICD-10-CM

## 2023-03-16 ENCOUNTER — Encounter: Payer: Self-pay | Admitting: Cardiovascular Disease

## 2023-03-16 ENCOUNTER — Ambulatory Visit: Payer: PPO | Attending: Cardiovascular Disease | Admitting: Cardiovascular Disease

## 2023-03-16 ENCOUNTER — Other Ambulatory Visit: Payer: Self-pay | Admitting: Radiation Oncology

## 2023-03-16 ENCOUNTER — Ambulatory Visit: Payer: Self-pay | Admitting: Radiation Oncology

## 2023-03-16 ENCOUNTER — Encounter: Payer: Self-pay | Admitting: Radiation Oncology

## 2023-03-16 VITALS — BP 130/70 | HR 78 | Ht 65.0 in | Wt 161.0 lb

## 2023-03-16 DIAGNOSIS — I5032 Chronic diastolic (congestive) heart failure: Secondary | ICD-10-CM

## 2023-03-16 DIAGNOSIS — I4821 Permanent atrial fibrillation: Secondary | ICD-10-CM

## 2023-03-16 DIAGNOSIS — Z952 Presence of prosthetic heart valve: Secondary | ICD-10-CM | POA: Diagnosis not present

## 2023-03-16 DIAGNOSIS — T8203XD Leakage of heart valve prosthesis, subsequent encounter: Secondary | ICD-10-CM

## 2023-03-16 DIAGNOSIS — I25119 Atherosclerotic heart disease of native coronary artery with unspecified angina pectoris: Secondary | ICD-10-CM

## 2023-03-16 MED ORDER — NITROGLYCERIN 0.4 MG SL SUBL: SUBLINGUAL_TABLET | SUBLINGUAL | 3 refills | Status: AC

## 2023-03-16 NOTE — Assessment & Plan Note (Signed)
Limited by NYHA functional class II symptoms.  States that she does okay walking on level ground, but when she walks outside she is short of breath, especially when she has to walk uphill.  Continue current doses of furosemide and Farxiga.  Repeat echo in 6 months when she follows up.

## 2023-03-16 NOTE — Progress Notes (Signed)
Cardiology Office Note:    Date:  03/16/2023   ID:  [name], DOB 1932-09-18, MRN [id number]  PCP:  Pincus Sanes, MD   [facility] Providers Cardiologist:  Tonny Bollman, MD     Referring MD: Pincus Sanes, MD   Chief Complaint  Patient presents with   Atrial Fibrillation    History of Present Illness:    [name] is a 89 y.o. female with a hx of:  Persistent atrial fibrillation  S/p TEE-DCCV 11/2019 CHA2DS2-VASc=6 (age x 2, female, CAD, CHF, HTN) >> Apixaban   Failed cardioversion with amiodarone in 2022, now with persistent atrial fibrillation Bicuspid valve, aortic stenosis S/p bioprosthetic AVR in 2016 Paravalvular leak Normal coronary arteries prior to AVR Coronary artery disease S/p NSTEMI in 01/2019 >> total occlusion of distal LAD felt to be prob embolic event Ischemic CM, EF 45-50 w ant-apical AK TEE 8/21: EF 55 Breast CA Hypertension  Hyperlipidemia  Diastolic CHF   The patient is here alone today.  She has been under more stress lately with her husband's health.  He is now approaching 89 years old.  She has had increasing shortness of breath with activity.  She denies orthopnea or PND.  She had 1 episode of chest discomfort that occurred at rest and was located in the substernal region, described as a pressure-like sensation.  This resolved without any intervention within about 5 minutes.  Patient denies leg swelling, lightheadedness, or syncope.  She has had no heart palpitations.  She is compliant with anticoagulation and reports no bleeding problems.  Current Medications: Current Meds  Medication Sig   acetaminophen (TYLENOL) 500 MG tablet Take 500-1,000 mg by mouth every 6 (six) hours as needed for mild pain or moderate pain.    alendronate (FOSAMAX) 70 MG tablet Take 1 tablet (70 mg total) by mouth every 7 (seven) days. Take with a full glass of water on an empty stomach.   ALPRAZolam (XANAX) 0.5 MG tablet TAKE 1 TABLET  TWICE DAILY AS NEEDED FOR AN ANXIETY, DO NOT TAKE AT NIGHT.   apixaban (ELIQUIS) 5 MG TABS tablet Take 1 tablet (5 mg total) by mouth 2 (two) times daily.   Calcium Carb-Cholecalciferol 600-400 MG-UNIT TABS Take 1 tablet by mouth daily.   Chloral Hydrate CRYS Take 10 mls by mouth at bedtime as needed   Cholecalciferol (VITAMIN D-3) 1000 UNITS CAPS Take 1,000 Units by mouth daily.    clindamycin (CLEOCIN) 300 MG capsule Per patient taking prior to dental appointment   dapagliflozin propanediol (FARXIGA) 10 MG TABS tablet Take 1 tablet (10 mg total) by mouth every other day.   ferrous sulfate 325 (65 FE) MG tablet Take 325 mg by mouth 2 (two) times a week. Every Monday and Wednesday   furosemide (LASIX) 20 MG tablet Take 1 tablet by mouth daily. If greater than 3 pound gain in 24 hours, increase to 2 tablets daily until weight returns to normal   ipratropium (ATROVENT) 0.06 % nasal spray 2 sprays each nostril every 6 hours to dry nose   levocetirizine (XYZAL) 5 MG tablet Take 1 tablet (5 mg total) by mouth daily as needed (Can take an extra dose during flare ups).   Magnesium Oxide 250 MG TABS Take by mouth.   metoprolol succinate (TOPROL-XL) 25 MG 24 hr tablet Take 1 tablet (25 mg total) by mouth daily.   pantoprazole (PROTONIX) 40 MG tablet TAKE ONE TABLET BY MOUTH TWICE DAILY   Polyethyl Glycol-Propyl  Glycol (SYSTANE ULTRA) 0.4-0.3 % SOLN Place 1 drop into both eyes daily as needed (dry eyes).   rosuvastatin (CRESTOR) 20 MG tablet Take 1 tablet (20 mg total) by mouth daily.   RYALTRIS [id number] MCG/ACT SUSP 2 sprays per nostril 1-2 times daily   tretinoin (RETIN-A) 0.05 % cream Apply topically.     Allergies:   Covid-19 (mrna) vaccine, Lactose intolerance (gi), Amoxicillin-pot clavulanate, Amoxicillin-pot clavulanate, Penicillin g benzathine, Penicillins, Sulfa antibiotics, and Sulfonamide derivatives   ROS:   Please see the history of present illness.    All other systems reviewed and are  negative.  EKGs/Labs/Other Studies Reviewed:    The following studies were reviewed today: Cardiac Studies & Procedures   CARDIAC CATHETERIZATION  CARDIAC CATHETERIZATION 02/04/2019  Narrative Conclusions: 1. Severe single-vessel coronary artery disease with occlusion of the distal LAD.  Otherwise, there is mild disease involving the proximal LAD and ostial diagonal branch.  Question if this represents acute plaque rupture versus coronary embolism. 2. No significant CAD involving the LCx and RCA.  Recommendations: 1. Medical therapy including dual antiplatelet therapy with aspirin and clopidogrel for up to 12 months.  Distal LAD at site of occlusion is very small and not well-suited for stent placement. 2. Aggressive secondary prevention. 3. Follow-up echocardiogram with particular attention to aortic valve prosthesis to exclude thrombus.  Yvonne Kendall, MD [facility] Pager: [phone]  Findings Coronary Findings Diagnostic  Dominance: Right  Left Main Vessel is large. Vessel is angiographically normal.  Left Anterior Descending Vessel is large. Mid LAD lesion is 15% stenosed. The lesion is eccentric. Dist LAD lesion is 100% stenosed. The lesion is thrombotic.  First Diagonal Branch Vessel is large in size. 1st Diag lesion is 20% stenosed.  Left Circumflex Vessel is large. Vessel is angiographically normal.  First Obtuse Marginal Branch Vessel is small in size.  Second Obtuse Marginal Branch Vessel is large in size.  Third Obtuse Marginal Branch Vessel is small in size.  Right Coronary Artery Vessel is angiographically normal.  Intervention  No interventions have been documented.   CARDIAC CATHETERIZATION  CARDIAC CATHETERIZATION 10/18/2014  Narrative Final conclusions: 1. Widely patent coronary arteries without evidence of obstructive coronary artery disease 2. Bulky calcification of the aortic valve with restricted mobility based on  fluoroscopic assessment 3. Essentially normal right heart pressures and normal cardiac output  Plan: refer to Dr Laneta Simmers for consideration of aortic valve replacement  Findings Coronary Findings Diagnostic  Dominance: Right  Left Main The vessel is angiographically normal.  Left Anterior Descending The vessel is angiographically normal.  Second Diagonal Branch The vessel is large in size.  Left Circumflex The vessel is angiographically normal.  Right Coronary Artery The vessel is angiographically normal.  Intervention  No interventions have been documented.     ECHOCARDIOGRAM  ECHOCARDIOGRAM COMPLETE 09/16/2022  Narrative ECHOCARDIOGRAM REPORT    Patient Name:   [name] Date of Exam: 09/16/2022 Medical Rec #:  [id number]         Height:       64.2 in Accession #:    [id number]        Weight:       160.0 lb Date of Birth:  01/11/1933        BSA:          1.783 m Patient Age:    89 years          BP:           125/87 mmHg Patient  Gender: F                 HR:           87 bpm. Exam Location:  [address]  Procedure: 2D Echo, Cardiac Doppler, Color Doppler and Strain Analysis  Indications:    I48.2 Atrial Fibrillation  History:        Patient has prior history of Echocardiogram examinations, most recent 06/07/2021. CHF, NSTEMI and CAD, Breast cancer; Risk Factors:Hypertension and HLD. Aortic Valve: 21 mm Magna Ease bioprosthetic valve is present in the aortic position. Procedure Date: 2016.  Sonographer:    Clearence Ped RCS Referring Phys: [phone] Markel Kurtenbach  IMPRESSIONS   1. Left ventricular ejection fraction, by estimation, is 60 to 65%. The left ventricle has normal function. The left ventricle has no regional wall motion abnormalities. Left ventricular diastolic parameters are indeterminate. The average left ventricular global longitudinal strain is -13.2 %. The global longitudinal strain is abnormal. 2. Right ventricular systolic function is  normal. The right ventricular size is mildly enlarged. There is mildly elevated pulmonary artery systolic pressure. The estimated right ventricular systolic pressure is 40.7 mmHg. 3. Left atrial size was moderately dilated. 4. Right atrial size was severely dilated. 5. The mitral valve is normal in structure. No evidence of mitral valve regurgitation. No evidence of mitral stenosis. There is mild holosystolic prolapse of both leaflets of the mitral valve. 6. Tricuspid valve regurgitation is moderate. 7. The aortic valve has been repaired/replaced. Aortic valve regurgitation is mild. No aortic stenosis is present. There is a 21 mm Magna Ease bioprosthetic valve present in the aortic position. Procedure Date: 2016. Echo findings are consistent with normal structure and function of the aortic valve prosthesis. Aortic regurgitation PHT measures 395 msec. Aortic valve mean gradient measures 7.8 mmHg. Aortic valve Vmax measures 1.96 m/s. 8. The inferior vena cava is normal in size with greater than 50% respiratory variability, suggesting right atrial pressure of 3 mmHg.  FINDINGS Left Ventricle: Left ventricular ejection fraction, by estimation, is 60 to 65%. The left ventricle has normal function. The left ventricle has no regional wall motion abnormalities. The average left ventricular global longitudinal strain is -13.2 %. The global longitudinal strain is abnormal. The left ventricular internal cavity size was normal in size. There is no left ventricular hypertrophy. Left ventricular diastolic parameters are indeterminate.  Right Ventricle: The right ventricular size is mildly enlarged. No increase in right ventricular wall thickness. Right ventricular systolic function is normal. There is mildly elevated pulmonary artery systolic pressure. The tricuspid regurgitant velocity is 2.86 m/s, and with an assumed right atrial pressure of 8 mmHg, the estimated right ventricular systolic pressure is 40.7  mmHg.  Left Atrium: Left atrial size was moderately dilated.  Right Atrium: Right atrial size was severely dilated.  Pericardium: There is no evidence of pericardial effusion.  Mitral Valve: The mitral valve is normal in structure. There is mild holosystolic prolapse of both leaflets of the mitral valve. No evidence of mitral valve regurgitation. No evidence of mitral valve stenosis.  Tricuspid Valve: The tricuspid valve is normal in structure. Tricuspid valve regurgitation is moderate . No evidence of tricuspid stenosis.  Aortic Valve: The aortic valve has been repaired/replaced. Aortic valve regurgitation is mild. Aortic regurgitation PHT measures 395 msec. No aortic stenosis is present. Aortic valve mean gradient measures 7.8 mmHg. Aortic valve peak gradient measures 15.4 mmHg. Aortic valve area, by VTI measures 0.77 cm. There is a 21 mm Magna Ease bioprosthetic  valve present in the aortic position. Procedure Date: 2016. Echo findings are consistent with normal structure and function of the aortic valve prosthesis.  Pulmonic Valve: The pulmonic valve was normal in structure. Pulmonic valve regurgitation is not visualized. No evidence of pulmonic stenosis.  Aorta: The aortic root is normal in size and structure.  Venous: The inferior vena cava is normal in size with greater than 50% respiratory variability, suggesting right atrial pressure of 3 mmHg.  IAS/Shunts: No atrial level shunt detected by color flow Doppler.   LEFT VENTRICLE PLAX 2D LVIDd:         4.10 cm   Diastology LVIDs:         2.70 cm   LV e' medial:    8.49 cm/s LV PW:         1.00 cm   LV E/e' medial:  12.3 LV IVS:        1.10 cm   LV e' lateral:   13.40 cm/s LVOT diam:     1.70 cm   LV E/e' lateral: 7.8 LV SV:         29 LV SV Index:   16        2D Longitudinal Strain LVOT Area:     2.27 cm  2D Strain GLS (A2C):   -12.1 % 2D Strain GLS (A3C):   -14.7 % 2D Strain GLS (A4C):   -12.8 % 2D Strain GLS Avg:      -13.2 %  RIGHT VENTRICLE RV Basal diam:  4.50 cm RV Mid diam:    3.60 cm RV S prime:     8.05 cm/s TAPSE (M-mode): 1.6 cm RVSP:           40.7 mmHg  LEFT ATRIUM             Index        RIGHT ATRIUM           Index LA diam:        5.10 cm 2.86 cm/m   RA Pressure: 8.00 mmHg LA Vol (A2C):   75.5 ml 42.36 ml/m  RA Area:     30.10 cm LA Vol (A4C):   84.2 ml 47.24 ml/m  RA Volume:   109.00 ml 61.15 ml/m LA Biplane Vol: 85.6 ml 48.02 ml/m AORTIC VALVE AV Area (Vmax):    0.71 cm AV Area (Vmean):   0.79 cm AV Area (VTI):     0.77 cm AV Vmax:           196.00 cm/s AV Vmean:          135.000 cm/s AV VTI:            0.377 m AV Peak Grad:      15.4 mmHg AV Mean Grad:      7.8 mmHg LVOT Vmax:         60.97 cm/s LVOT Vmean:        47.100 cm/s LVOT VTI:          0.128 m LVOT/AV VTI ratio: 0.34 AI PHT:            395 msec  AORTA Ao Root diam: 2.80 cm Ao Asc diam:  2.80 cm  MITRAL VALVE                TRICUSPID VALVE MV Area (PHT):              TR Peak grad:   32.7 mmHg MV Decel Time:  TR Vmax:        286.00 cm/s MV E velocity: 104.70 cm/s  Estimated RAP:  8.00 mmHg RVSP:           40.7 mmHg  SHUNTS Systemic VTI:  0.13 m Systemic Diam: 1.70 cm  Donato Schultz MD Electronically signed by Donato Schultz MD Signature Date/Time: 09/16/2022/2:55:28 PM    Final   TEE  ECHO TEE 12/09/2019  Narrative TRANSESOPHOGEAL ECHO REPORT    Patient Name:   [name] Date of Exam: 12/09/2019 Medical Rec #:  [id number]             Height:       65.0 in Accession #:    [id number]            Weight:       163.6 lb Date of Birth:  29-Aug-1932            BSA:          1.816 m Patient Age:    86 years              BP:           134/84 mmHg Patient Gender: F                     HR:           122 bpm. Exam Location:  Inpatient  Procedure: TEE-Intraopertive  Indications:    Afib  History:        Patient has prior history of Echocardiogram examinations. Aortic Valve  Disease; Arrythmias:Atrial Fibrillation.  Sonographer:    Ross Ludwig RDCS (AE) Referring Phys: [id number] MARK C SKAINS  PROCEDURE: The transesophogeal probe was passed without difficulty through the esophogus of the patient. Sedation performed by different physician. The patient developed no complications during the procedure.  IMPRESSIONS   1. Extensive 3 D rending done of AVR. 2. No LAA thormbus DCC x 1 200 J converted to NSR. 3. Left ventricular ejection fraction, by estimation, is 55%. The left ventricle has normal function. The left ventricle has no regional wall motion abnormalities. There is mild left ventricular hypertrophy. Left ventricular diastolic function could not be evaluated. 4. Right ventricular systolic function is moderately reduced. The right ventricular size is moderately enlarged. 5. Left atrial size was severely dilated. No left atrial/left atrial appendage thrombus was detected. 6. Right atrial size was severely dilated. 7. The mitral valve is normal in structure. Mild mitral valve regurgitation. 8. Tricuspid valve regurgitation is moderate to severe. 9. Post AVR 2016 with 21 mm [facility] Ease stented pericardial tissue valve Mean gradient 8 mmHg Moderate PVL 1:00 on BSA views may be related to suture dehiscence as she had a bicuspid AV prior to implant . The aortic valve has been repaired/replaced. Aortic valve regurgitation is moderate.  FINDINGS Left Ventricle: Left ventricular ejection fraction, by estimation, is 55%. The left ventricle has normal function. The left ventricle has no regional wall motion abnormalities. The left ventricular internal cavity size was normal in size. There is mild left ventricular hypertrophy. Left ventricular diastolic function could not be evaluated.  Right Ventricle: The right ventricular size is moderately enlarged. Right vetricular wall thickness was not assessed. Right ventricular systolic function is moderately reduced.  Left  Atrium: Left atrial size was severely dilated. No left atrial/left atrial appendage thrombus was detected.  Right Atrium: Right atrial size was severely dilated.  Pericardium: There is no evidence of pericardial effusion.  Mitral  Valve: The mitral valve is normal in structure. There is mild thickening of the mitral valve leaflet(s). There is mild calcification of the mitral valve leaflet(s). Mild mitral annular calcification. Mild mitral valve regurgitation.  Tricuspid Valve: The tricuspid valve is normal in structure. Tricuspid valve regurgitation is moderate to severe.  Aortic Valve: Post AVR 2016 with 21 mm [facility] Ease stented pericardial tissue valve Mean gradient 8 mmHg Moderate PVL 1:00 on BSA views may be related to suture dehiscence as she had a bicuspid AV prior to implant. The aortic valve has been repaired/replaced. Aortic valve regurgitation is moderate. Aortic valve mean gradient measures 8.0 mmHg. Aortic valve peak gradient measures 15.7 mmHg. There is a 21 mm Edwards valve present in the aortic position.  Pulmonic Valve: The pulmonic valve was normal in structure. Pulmonic valve regurgitation is mild.  Aorta: The aortic root is normal in size and structure.  IAS/Shunts: No atrial level shunt detected by color flow Doppler.  Additional Comments: Extensive 3 D rending done of AVR. No LAA thormbus DCC x 1 200 J converted to NSR.   AORTIC VALVE AV Vmax:      198.00 cm/s AV Vmean:     124.000 cm/s AV VTI:       0.321 m AV Peak Grad: 15.7 mmHg AV Mean Grad: 8.0 mmHg  Charlton Haws MD Electronically signed by Charlton Haws MD Signature Date/Time: 12/09/2019/9:50:10 AM    Final   MONITORS  LONG TERM MONITOR (3-14 DAYS) 02/14/2020  Narrative 1. The basic rhythm is sinus bradycardia with an average HR of 49 bpm 2. No atrial fibrillation or flutter 3. No high-grade heart block or pathologic pauses 4. There are rare PVC's and occasional supraventricular beats  without sustained arrhythmias.  PAC burden is 2.2%.           EKG:        Recent Labs: 08/10/2022: ALT 30; Hemoglobin 13.5; Platelets 120 08/27/2022: TSH 1.00 10/20/2022: BUN 17; Potassium 4.4; Sodium 142 03/06/2023: Creatinine, Ser 0.90  Recent Lipid Panel    Component Value Date/Time   CHOL 152 08/27/2022 1045   TRIG 69.0 08/27/2022 1045   HDL 70.80 08/27/2022 1045   CHOLHDL 2 08/27/2022 1045   VLDL 13.8 08/27/2022 1045   LDLCALC 68 08/27/2022 1045   LDLDIRECT 144.0 11/29/2012 1601     Risk Assessment/Calculations:    CHA2DS2-VASc Score = 6   This indicates a 9.7% annual risk of stroke. The patient's score is based upon: CHF History: 1 HTN History: 1 Diabetes History: 0 Stroke History: 0 Vascular Disease History: 1 Age Score: 2 Gender Score: 1               Physical Exam:    VS:  BP 130/70 (BP Location: Left Arm, Patient Position: Sitting, Cuff Size: Normal)   Pulse 78   Ht 5\' 5"  (1.651 m)   Wt 161 lb (73 kg)   SpO2 92%   BMI 26.79 kg/m     Wt Readings from Last 3 Encounters:  03/16/23 161 lb (73 kg)  11/12/22 160 lb (72.6 kg)  10/21/22 159 lb (72.1 kg)     GEN:  Well nourished, well developed in no acute distress HEENT: Normal NECK: No JVD; No carotid bruits LYMPHATICS: No lymphadenopathy CARDIAC: irregularly irregular, no murmurs, rubs, gallops RESPIRATORY:  Clear to auscultation without rales, wheezing or rhonchi  ABDOMEN: Soft, non-tender, non-distended MUSCULOSKELETAL:  No edema; No deformity  SKIN: Warm and dry NEUROLOGIC:  Alert and oriented x  3 PSYCHIATRIC:  Normal affect   Assessment & Plan Chronic diastolic heart failure (HCC) Limited by NYHA functional class II symptoms.  States that she does okay walking on level ground, but when she walks outside she is short of breath, especially when she has to walk uphill.  Continue current doses of furosemide and Farxiga.  Repeat echo in 6 months when she follows up. Permanent atrial  fibrillation (HCC) Continue apixaban.  Patient denies bleeding problems.  Heart rate is controlled on metoprolol succinate.  No changes made. S/P AVR (aortic valve replacement) Patient has had bioprosthetic aortic valve replacement.  She has normal LVEF and normal transvalvular gradients based on review of her most recent echo outlined above.  Mean transvalvular gradient is less than 10 mmHg. Paravalvular leak of prosthetic heart valve, subsequent encounter Stable findings over serial echo studies.  Repeat echocardiogram recommended in 6 months. Coronary artery disease involving native coronary artery of native heart with angina pectoris [facility]) Patient had an episode of substernal chest discomfort recently, lasting a few minutes.  She has been under a lot of stress and feels that it may have been stress related.  I wrote a refill for her nitroglycerin in case she needs to take it.  Advised her to rest and relax for 5 minutes if she has recurrent chest pain, and if symptoms persist she will use the nitroglycerin.      Medication Adjustments/Labs and Tests Ordered: Current medicines are reviewed at length with the patient today.  Concerns regarding medicines are outlined above.  Orders Placed This Encounter  Procedures   ECHOCARDIOGRAM COMPLETE   Meds ordered this encounter  Medications   nitroGLYCERIN (NITROSTAT) 0.4 MG SL tablet    Sig: Dissolve 1 tablet under the tongue every 5 minutes as needed for chest pain. Max of 3 doses, then 911.    Dispense:  25 tablet    Refill:  3    Patient Instructions  Testing/Procedures: ECHO Your physician has requested that you have an echocardiogram. Echocardiography is a painless test that uses sound waves to create images of your heart. It provides your doctor with information about the size and shape of your heart and how well your heart's chambers and valves are working. This procedure takes approximately one hour. There are no restrictions for this  procedure. Please do NOT wear cologne, perfume, aftershave, or lotions (deodorant is allowed). Please arrive 15 minutes prior to your appointment time.  Please note: We ask at that you not bring children with you during ultrasound (echo/ vascular) testing. Due to room size and safety concerns, children are not allowed in the ultrasound rooms during exams. Our front office staff cannot provide observation of children in our lobby area while testing is being conducted. An adult accompanying a patient to their appointment will only be allowed in the ultrasound room at the discretion of the ultrasound technician under special circumstances. We apologize for any inconvenience.  Follow-Up: At [facility], you and your health needs are our priority.  As part of our continuing mission to provide you with exceptional heart care, we have created designated Provider Care Teams.  These Care Teams include your primary Cardiologist (physician) and Advanced Practice Providers (APPs -  Physician Assistants and Nurse Practitioners) who all work together to provide you with the care you need, when you need it.  Your next appointment:   6 month(s)  Provider:   Tonny Bollman, MD        Signed, Tonny Bollman,  MD  03/16/2023 12:41 PM    [facility]

## 2023-03-16 NOTE — Assessment & Plan Note (Signed)
Continue apixaban.  Patient denies bleeding problems.  Heart rate is controlled on metoprolol succinate.  No changes made.

## 2023-03-16 NOTE — Assessment & Plan Note (Signed)
Patient had an episode of substernal chest discomfort recently, lasting a few minutes.  She has been under a lot of stress and feels that it may have been stress related.  I wrote a refill for her nitroglycerin in case she needs to take it.  Advised her to rest and relax for 5 minutes if she has recurrent chest pain, and if symptoms persist she will use the nitroglycerin.

## 2023-03-16 NOTE — Telephone Encounter (Signed)
Can we get her a follow up with Dr. Delton Coombes  -first available

## 2023-03-16 NOTE — Progress Notes (Signed)
Pt sent mychart message to ask for her interval scan to be moved 4-5 months out rather than 3-4 months. I adjusted her order intervals

## 2023-03-16 NOTE — Patient Instructions (Signed)
Testing/Procedures: ECHO Your physician has requested that you have an echocardiogram. Echocardiography is a painless test that uses sound waves to create images of your heart. It provides your doctor with information about the size and shape of your heart and how well your heart's chambers and valves are working. This procedure takes approximately one hour. There are no restrictions for this procedure. Please do NOT wear cologne, perfume, aftershave, or lotions (deodorant is allowed). Please arrive 15 minutes prior to your appointment time.  Please note: We ask at that you not bring children with you during ultrasound (echo/ vascular) testing. Due to room size and safety concerns, children are not allowed in the ultrasound rooms during exams. Our front office staff cannot provide observation of children in our lobby area while testing is being conducted. An adult accompanying a patient to their appointment will only be allowed in the ultrasound room at the discretion of the ultrasound technician under special circumstances. We apologize for any inconvenience.  Follow-Up: At [facility], you and your health needs are our priority.  As part of our continuing mission to provide you with exceptional heart care, we have created designated Provider Care Teams.  These Care Teams include your primary Cardiologist (physician) and Advanced Practice Providers (APPs -  Physician Assistants and Nurse Practitioners) who all work together to provide you with the care you need, when you need it.  Your next appointment:   6 month(s)  Provider:   Tonny Bollman, MD

## 2023-03-18 ENCOUNTER — Encounter: Payer: Self-pay | Admitting: Internal Medicine

## 2023-04-03 ENCOUNTER — Ambulatory Visit (INDEPENDENT_AMBULATORY_CARE_PROVIDER_SITE_OTHER): Payer: PPO

## 2023-04-03 VITALS — Ht 63.0 in | Wt 154.0 lb

## 2023-04-03 DIAGNOSIS — Z0000 Encounter for general adult medical examination without abnormal findings: Secondary | ICD-10-CM | POA: Diagnosis not present

## 2023-04-03 NOTE — Patient Instructions (Signed)
Ms. Gravett , Thank you for taking time to come for your Medicare Wellness Visit. I appreciate your ongoing commitment to your health goals. Please review the following plan we discussed and let me know if I can assist you in the future.   Referrals/Orders/Follow-Ups/Clinician Recommendations: It was nice talking to you today.  Keep up the good work.  This is a list of the screening recommended for you and due dates:  Health Maintenance  Topic Date Due   COVID-19 Vaccine (9 - 2023-24 season) 04/19/2023*   Mammogram  06/25/2023   DEXA scan (bone density measurement)  08/29/2023   Medicare Annual Wellness Visit  04/02/2024   DTaP/Tdap/Td vaccine (3 - Td or Tdap) 09/30/2026   Pneumonia Vaccine  Completed   Flu Shot  Completed   Zoster (Shingles) Vaccine  Completed   HPV Vaccine  Aged Out   Colon Cancer Screening  Discontinued  *Topic was postponed. The date shown is not the original due date.    Advanced directives: (Copy Requested) Please bring a copy of your health care power of attorney and living will to the office to be added to your chart at your convenience.  Next Medicare Annual Wellness Visit scheduled for next year: Yes

## 2023-04-03 NOTE — Progress Notes (Signed)
Subjective:   [name] is a 89 y.o. female who presents for Medicare Annual (Subsequent) preventive examination.  Visit Complete: Virtual I connected with  [name] on 04/03/23 by a audio enabled telemedicine application and verified that I am speaking with the correct person using two identifiers.  Patient Location: Other:  Herbalist Location: Office/Clinic  I discussed the limitations of evaluation and management by telemedicine. The patient expressed understanding and agreed to proceed.  Vital Signs: Because this visit was a virtual/telehealth visit, some criteria may be missing or patient reported. Any vitals not documented were not able to be obtained and vitals that have been documented are patient reported.   Cardiac Risk Factors include: advanced age (>42men, >35 women);hypertension;Other (see comment);dyslipidemia, Risk factor comments: NSTEMI, CAD, A-Fib, Aortic atherosclerosis, CHF     Objective:    Today's Vitals   04/03/23 1119  Weight: 154 lb (69.9 kg)  Height: 5\' 3"  (1.6 m)   Body mass index is 27.28 kg/m.     04/03/2023   11:31 AM 03/10/2023   11:58 AM 08/10/2022   11:04 AM 07/29/2022    7:58 AM 04/14/2022    9:18 AM 01/20/2022    1:12 PM 04/15/2021    8:24 AM  Advanced Directives  Does Patient Have a Medical Advance Directive? Yes Yes No Yes No Yes Yes  Type of Estate agent of [place];Living will     Living will;Healthcare Power of Attorney Living will;Healthcare Power of Attorney  Copy of Healthcare Power of Attorney in Chart? No - copy requested        Would patient like information on creating a medical advance directive?   No - Patient declined  No - Patient declined      Current Medications (verified) Outpatient Encounter Medications as of 04/03/2023  Medication Sig   acetaminophen (TYLENOL) 500 MG tablet Take 500-1,000 mg by mouth every 6 (six) hours as needed for mild pain or moderate  pain.    alendronate (FOSAMAX) 70 MG tablet Take 1 tablet (70 mg total) by mouth every 7 (seven) days. Take with a full glass of water on an empty stomach.   ALPRAZolam (XANAX) 0.5 MG tablet TAKE 1 TABLET TWICE DAILY AS NEEDED FOR AN ANXIETY, DO NOT TAKE AT NIGHT.   apixaban (ELIQUIS) 5 MG TABS tablet Take 1 tablet (5 mg total) by mouth 2 (two) times daily.   Calcium Carb-Cholecalciferol 600-400 MG-UNIT TABS Take 1 tablet by mouth daily.   Chloral Hydrate CRYS Take 10 mls by mouth at bedtime as needed   Cholecalciferol (VITAMIN D-3) 1000 UNITS CAPS Take 1,000 Units by mouth daily.    clindamycin (CLEOCIN) 300 MG capsule Per patient taking prior to dental appointment   dapagliflozin propanediol (FARXIGA) 10 MG TABS tablet Take 1 tablet (10 mg total) by mouth every other day.   ferrous sulfate 325 (65 FE) MG tablet Take 325 mg by mouth 2 (two) times a week. Every Monday and Wednesday   furosemide (LASIX) 20 MG tablet Take 1 tablet by mouth daily. If greater than 3 pound gain in 24 hours, increase to 2 tablets daily until weight returns to normal   ipratropium (ATROVENT) 0.06 % nasal spray 2 sprays each nostril every 6 hours to dry nose   levocetirizine (XYZAL) 5 MG tablet Take 1 tablet (5 mg total) by mouth daily as needed (Can take an extra dose during flare ups).   Magnesium Oxide 250 MG TABS Take  by mouth.   metoprolol succinate (TOPROL-XL) 25 MG 24 hr tablet Take 1 tablet (25 mg total) by mouth daily.   nitroGLYCERIN (NITROSTAT) 0.4 MG SL tablet Dissolve 1 tablet under the tongue every 5 minutes as needed for chest pain. Max of 3 doses, then 911.   pantoprazole (PROTONIX) 40 MG tablet TAKE ONE TABLET BY MOUTH TWICE DAILY   Polyethyl Glycol-Propyl Glycol (SYSTANE ULTRA) 0.4-0.3 % SOLN Place 1 drop into both eyes daily as needed (dry eyes).   rosuvastatin (CRESTOR) 20 MG tablet Take 1 tablet (20 mg total) by mouth daily.   RYALTRIS [id number] MCG/ACT SUSP 2 sprays per nostril 1-2 times daily    tretinoin (RETIN-A) 0.05 % cream Apply topically.   [DISCONTINUED] nitroGLYCERIN (NITROSTAT) 0.4 MG SL tablet Place 1 tablet (0.4 mg total) under the tongue every 5 (five) minutes as needed for chest pain (CP or SOB).   No facility-administered encounter medications on file as of 04/03/2023.    Allergies (verified) Covid-19 (mrna) vaccine, Lactose intolerance (gi), Amoxicillin-pot clavulanate, Amoxicillin-pot clavulanate, Penicillin g benzathine, Penicillins, Sulfa antibiotics, and Sulfonamide derivatives   History: Past Medical History:  Diagnosis Date   Allergy    Anemia    Anxiety    Aortic valve stenosis, severe    s/p AVR // mild to mod paravalvular leakage // Echo 9/21: EF 55-60, no RWMA, mild LVH, GR 2 DD, mildly reduced RVSF, RVSP 38.7, moderate LAE, mild RAE, AVR with mean gradient 11 mmHg, mild paravalvular leakage   Arthritis    Breast cancer (HCC) 2007   right   Cataract    Constipation    Dysrhythmia    A-fib   Hiatal hernia    pt denies   Hypercholesteremia    Hypertension    Hypoglycemia    Osteopenia    Personal history of radiation therapy    Tingling    Ulcer 2012   bleeding gastric Ulcer   Past Surgical History:  Procedure Laterality Date   AORTIC VALVE REPLACEMENT N/A 11/16/2014   Procedure: AORTIC VALVE REPLACEMENT (AVR);  Surgeon: Alleen Borne, MD;  Location: [facility] OR;  Service: Open Heart Surgery;  Laterality: N/A;   BIOPSY  12/15/2017   Procedure: BIOPSY;  Surgeon: Meridee Score Netty Starring., MD;  Location: [facility] ENDOSCOPY;  Service: Gastroenterology;;   BIOPSY  01/10/2021   Procedure: BIOPSY;  Surgeon: Lemar Lofty., MD;  Location: [facility] ENDOSCOPY;  Service: Gastroenterology;;   BREAST BIOPSY     BREAST LUMPECTOMY Right    2005?   BREAST SURGERY     BRONCHIAL BIOPSY  03/06/2020   Procedure: BRONCHIAL BIOPSIES;  Surgeon: Leslye Peer, MD;  Location: [facility] ENDOSCOPY;  Service: Pulmonary;;   BRONCHIAL BRUSHINGS  03/06/2020   Procedure: BRONCHIAL  BRUSHINGS;  Surgeon: Leslye Peer, MD;  Location: [facility] ENDOSCOPY;  Service: Pulmonary;;   BRONCHIAL NEEDLE ASPIRATION BIOPSY  03/06/2020   Procedure: BRONCHIAL NEEDLE ASPIRATION BIOPSIES;  Surgeon: Leslye Peer, MD;  Location: [facility] ENDOSCOPY;  Service: Pulmonary;;   BRONCHIAL WASHINGS  03/06/2020   Procedure: BRONCHIAL WASHINGS;  Surgeon: Leslye Peer, MD;  Location: [facility] ENDOSCOPY;  Service: Pulmonary;;   CARDIAC CATHETERIZATION N/A 10/18/2014   Procedure: Right/Left Heart Cath and Coronary Angiography;  Surgeon: Tonny Bollman, MD;  Location: [facility] INVASIVE CV LAB;  Service: Cardiovascular;  Laterality: N/A;   CARDIOVERSION N/A 12/09/2019   Procedure: CARDIOVERSION;  Surgeon: Wendall Stade, MD;  Location: [facility] ENDOSCOPY;  Service: Cardiovascular;  Laterality: N/A;   CARDIOVERSION N/A 12/30/2019  Procedure: CARDIOVERSION;  Surgeon: Wendall Stade, MD;  Location: [facility] ENDOSCOPY;  Service: Cardiovascular;  Laterality: N/A;   COLONOSCOPY WITH PROPOFOL N/A 01/10/2021   Procedure: COLONOSCOPY WITH PROPOFOL;  Surgeon: Lemar Lofty., MD;  Location: [facility] ENDOSCOPY;  Service: Gastroenterology;  Laterality: N/A;   CORONARY ANGIOGRAPHY N/A 02/04/2019   Procedure: CORONARY ANGIOGRAPHY;  Surgeon: Yvonne Kendall, MD;  Location: [facility] INVASIVE CV LAB;  Service: Cardiovascular;  Laterality: N/A;   COSMETIC SURGERY     face   ESOPHAGOGASTRODUODENOSCOPY (EGD) WITH PROPOFOL N/A 12/15/2017   Procedure: ESOPHAGOGASTRODUODENOSCOPY (EGD) WITH PROPOFOL;  Surgeon: Meridee Score Netty Starring., MD;  Location: [facility] ENDOSCOPY;  Service: Gastroenterology;  Laterality: N/A;   FIDUCIAL MARKER PLACEMENT  03/06/2020   Procedure: FIDUCIAL MARKER PLACEMENT;  Surgeon: Leslye Peer, MD;  Location: [facility] ENDOSCOPY;  Service: Pulmonary;;   PARATHYROIDECTOMY     TEE WITHOUT CARDIOVERSION N/A 11/16/2014   Procedure: TRANSESOPHAGEAL ECHOCARDIOGRAM (TEE);  Surgeon: Alleen Borne, MD;  Location: [facility] OR;  Service: Open Heart Surgery;  Laterality:  N/A;   TEE WITHOUT CARDIOVERSION N/A 12/09/2019   Procedure: TRANSESOPHAGEAL ECHOCARDIOGRAM (TEE);  Surgeon: Wendall Stade, MD;  Location: [facility] ENDOSCOPY;  Service: Cardiovascular;  Laterality: N/A;   TONSILLECTOMY     VIDEO BRONCHOSCOPY WITH ENDOBRONCHIAL NAVIGATION Right 03/06/2020   Procedure: VIDEO BRONCHOSCOPY WITH ENDOBRONCHIAL NAVIGATION;  Surgeon: Leslye Peer, MD;  Location: [facility] ENDOSCOPY;  Service: Pulmonary;  Laterality: Right;   Family History  Problem Relation Age of Onset   Hypertension Mother    Rectal cancer Mother    Colon cancer Mother    Kidney disease Father    Cancer Cousin 47       breast and ovarian    Esophageal cancer Neg Hx    Stomach cancer Neg Hx    Colon polyps Neg Hx    Social History   Socioeconomic History   Marital status: Married    Spouse name: Francis Dowse   Number of children: 3   Years of education: 16   Highest education level: Bachelor's degree (e.g., BA, AB, BS)  Occupational History   Occupation: Retired  Tobacco Use   Smoking status: Former    Current packs/day: 0.00    Average packs/day: 0.5 packs/day for 15.0 years (7.5 ttl pk-yrs)    Types: Cigarettes    Start date: 39    Quit date: 1986    Years since quitting: 38.9   Smokeless tobacco: Never   Tobacco comments:    quit smoking 1980  Vaping Use   Vaping status: Never Used  Substance and Sexual Activity   Alcohol use: Yes    Alcohol/week: 7.0 standard drinks of alcohol    Types: 7 Glasses of wine per week    Comment: wine nightly with dinner   Drug use: No   Sexual activity: Not Currently  Other Topics Concern   Not on file  Social History Narrative   ** Merged History Encounter **       Lives at [facility] with her husband. Right-handed. Caffeine use: 3-4 cups per day (some tea, mixes coffee with decaf).   Social Determinants of Health   Financial Resource Strain: Low Risk  (04/03/2023)   Overall Financial Resource Strain (CARDIA)    Difficulty of Paying Living  Expenses: Not hard at all  Food Insecurity: No Food Insecurity (04/03/2023)   Hunger Vital Sign    Worried About Running Out of Food in the Last Year: Never true    Ran Out of  Food in the Last Year: Never true  Transportation Needs: No Transportation Needs (04/03/2023)   PRAPARE - Administrator, Civil Service (Medical): No    Lack of Transportation (Non-Medical): No  Physical Activity: Insufficiently Active (04/03/2023)   Exercise Vital Sign    Days of Exercise per Week: 7 days    Minutes of Exercise per Session: 10 min  Stress: No Stress Concern Present (04/03/2023)   [employer] of Occupational Health - Occupational Stress Questionnaire    Feeling of Stress : Not at all  Social Connections: Moderately Integrated (04/03/2023)   Social Connection and Isolation Panel [NHANES]    Frequency of Communication with Friends and Family: Three times a week    Frequency of Social Gatherings with Friends and Family: More than three times a week    Attends Religious Services: More than 4 times per year    Active Member of [employer] or Organizations: No    Attends Engineer, structural: Never    Marital Status: Married    Tobacco Counseling Counseling given: Not Answered Tobacco comments: quit smoking 1980   Clinical Intake:  Pre-visit preparation completed: Yes  Pain : No/denies pain     BMI - recorded: 27.28 Nutritional Status: BMI 25 -29 Overweight Nutritional Risks: None  How often do you need to have someone help you when you read instructions, pamphlets, or other written materials from your doctor or pharmacy?: 1 - Never  Interpreter Needed?: No  Information entered by :: Usama Harkless, RMA   Activities of Daily Living    04/03/2023   11:22 AM  In your present state of health, do you have any difficulty performing the following activities:  Hearing? 1  Comment hearing aides  Vision? 0  Difficulty concentrating or making decisions? 0  Walking or  climbing stairs? 0  Dressing or bathing? 0  Doing errands, shopping? 0  Preparing Food and eating ? N  Using the Toilet? N  In the past six months, have you accidently leaked urine? N  Do you have problems with loss of bowel control? N  Managing your Medications? N  Managing your Finances? N  Housekeeping or managing your Housekeeping? N    Patient Care Team: Pincus Sanes, MD as PCP - General (Internal Medicine) Tonny Bollman, MD as PCP - Cardiology (Cardiology) Pyrtle, Carie Caddy, MD as Consulting Physician (Gastroenterology) Tonny Bollman, MD as Consulting Physician (Cardiology) Serena Croissant, MD as Consulting Physician (Hematology and Oncology) Levert Feinstein, MD as Consulting Physician (Neurology) Mansouraty, Netty Starring., MD as Consulting Physician (Gastroenterology) Kathyrn Sheriff, [facility] (Inactive) as Pharmacist (Pharmacist) Pincus Sanes, MD as Consulting Physician (Internal Medicine) Antony Contras, MD as Consulting Physician (Ophthalmology) Shelah Lewandowsky, MD as Consulting Physician (Optometry)  Indicate any recent Medical Services you may have received from other than [facility] providers in the past year (date may be approximate).     Assessment:   This is a routine wellness examination for [place].  Hearing/Vision screen Hearing Screening - Comments:: Wears hearing aides Vision Screening - Comments:: Wears eyeglasses   Goals Addressed   None   Depression Screen    04/03/2023   11:37 AM 10/09/2022   10:43 AM 08/21/2022   10:19 AM 11/06/2021    3:19 PM 11/06/2021    3:18 PM 12/23/2019   10:43 AM 11/23/2018    9:36 AM  PHQ 2/9 Scores  PHQ - 2 Score 0 0 0 0 0 0 1  PHQ- 9 Score 0 0  0 0 0      Fall Risk    04/03/2023   11:32 AM 10/09/2022   10:43 AM 08/21/2022   10:19 AM 11/06/2021    3:18 PM 02/07/2021   10:25 AM  Fall Risk   Falls in the past year? 0 0 0 0 0  Number falls in past yr: 0 0 0 0 0  Injury with Fall? 0 0 0 0 0  Risk for fall due to : No Fall Risks No  Fall Risks No Fall Risks  No Fall Risks  Follow up Falls prevention discussed;Falls evaluation completed Falls evaluation completed Falls evaluation completed  Falls evaluation completed    MEDICARE RISK AT HOME: Medicare Risk at Home Any stairs in or around the home?: No (takes the elevator) Home free of loose throw rugs in walkways, pet beds, electrical cords, etc?: Yes Adequate lighting in your home to reduce risk of falls?: Yes Life alert?: No Use of a cane, walker or w/c?: No Grab bars in the bathroom?: Yes Shower chair or bench in shower?: Yes Elevated toilet seat or a handicapped toilet?: Yes  TIMED UP AND GO:  Was the test performed?  No    Cognitive Function:    10/02/2017    2:09 PM 02/21/2015    9:45 AM  MMSE - Mini Mental State Exam  Not completed:  --  Orientation to time 5   Orientation to Place 5   Registration 3   Attention/ Calculation 5   Recall 2   Language- name 2 objects 2   Language- repeat 1   Language- follow 3 step command 3   Language- read & follow direction 1   Write a sentence 1   Copy design 1   Total score 29         04/03/2023   11:20 AM 11/06/2021    3:17 PM  6CIT Screen  What Year? 0 points 0 points  What month? 0 points 0 points  What time? 0 points 0 points  Count back from 20 0 points 0 points  Months in reverse 0 points 0 points  Repeat phrase 2 points 0 points  Total Score 2 points 0 points    Immunizations Immunization History  Administered Date(s) Administered   Fluad Quad(high Dose 65+) 12/23/2018, 02/29/2020, 02/07/2021, 01/28/2022   Fluad Trivalent(High Dose 65+) 02/10/2023   Influenza Split 01/29/2011, 02/10/2012   Influenza Whole 01/27/2004, 02/02/2007, 02/08/2008, 02/09/2009, 01/16/2010   Influenza, High Dose Seasonal PF 01/29/2016, 01/27/2017, 01/15/2018, 08/08/2019, 02/26/2020   Influenza,inj,Quad PF,6+ Mos 01/18/2013, 01/26/2014, 01/23/2015   Moderna Covid-19 Vaccine Bivalent Booster 54yrs & up 02/08/2021,  08/21/2022   Moderna SARS-COV2 Booster Vaccination 03/14/2020   Moderna Sars-Covid-2 Vaccination 05/09/2019, 06/07/2019, 08/08/2019, 07/28/2020, 02/26/2022, 01/29/2023   Pneumococcal Conjugate-13 02/21/2015   Pneumococcal Polysaccharide-23 04/28/2004   Respiratory Syncytial Virus Vaccine,Recomb Aduvanted(Arexvy) 05/01/2022   Td 04/28/2005   Tdap 09/29/2016   Zoster Recombinant(Shingrix) 10/30/2017, 03/22/2018   Zoster, Live 04/28/2009    TDAP status: Up to date  Flu Vaccine status: Up to date  Pneumococcal vaccine status: Up to date  Covid-19 vaccine status: Completed vaccines  Qualifies for Shingles Vaccine? Yes   Zostavax completed Yes   Shingrix Completed?: Yes  Screening Tests Health Maintenance  Topic Date Due   COVID-19 Vaccine (9 - 2023-24 season) 04/19/2023 (Originally 03/26/2023)   MAMMOGRAM  06/25/2023   DEXA SCAN  08/29/2023   Medicare Annual Wellness (AWV)  04/02/2024   DTaP/Tdap/Td (3 - Td or Tdap) 09/30/2026  Pneumonia Vaccine 35+ Years old  Completed   INFLUENZA VACCINE  Completed   Zoster Vaccines- Shingrix  Completed   HPV VACCINES  Aged Out   Colonoscopy  Discontinued    Health Maintenance  There are no preventive care reminders to display for this patient.   Colorectal cancer screening: No longer required.   Mammogram status: Completed 06/24/2022. Repeat every year  Bone Density status: Completed 08/28/2021. Results reflect: Bone density results: OSTEOPOROSIS. Repeat every 2 years.  Lung Cancer Screening: (Low Dose CT Chest recommended if Age 57-80 years, 20 pack-year currently smoking OR have quit w/in 15years.) does qualify.   Lung Cancer Screening Referral: Has an appt on 07/09/2023  Additional Screening:  Hepatitis C Screening: does not qualify;  Vision Screening: Recommended annual ophthalmology exams for early detection of glaucoma and other disorders of the eye. Is the patient up to date with their annual eye exam?  Yes  Who is  the provider or what is the name of the office in which the patient attends annual eye exams? Dr. Randon Goldsmith and Dr. Essie Hart If pt is not established with a provider, would they like to be referred to a provider to establish care? No .   Dental Screening: Recommended annual dental exams for proper oral hygiene   Community Resource Referral / Chronic Care Management: CRR required this visit?  No   CCM required this visit?  No     Plan:     I have personally reviewed and noted the following in the patient's chart:   Medical and social history Use of alcohol, tobacco or illicit drugs  Current medications and supplements including opioid prescriptions. Patient is not currently taking opioid prescriptions. Functional ability and status Nutritional status Physical activity Advanced directives List of other physicians Hospitalizations, surgeries, and ER visits in previous 12 months Vitals Screenings to include cognitive, depression, and falls Referrals and appointments  In addition, I have reviewed and discussed with patient certain preventive protocols, quality metrics, and best practice recommendations. A written personalized care plan for preventive services as well as general preventive health recommendations were provided to patient.     Jagjit Riner L Francoise Chojnowski, CMA   04/03/2023   After Visit Summary: (MyChart) Due to this being a telephonic visit, the after visit summary with patients personalized plan was offered to patient via MyChart   Nurse Notes: Patient is up to date on all her health maintenance, however she has a few questions for Dr. Lawerance Bach.  Patient would like to discuss a Cologuard with Dr. Lawerance Bach during her up coming visit.  She would also like to know if she should be taking Magnesium 250 mg daily.

## 2023-04-09 ENCOUNTER — Encounter: Payer: Self-pay | Admitting: Internal Medicine

## 2023-04-09 NOTE — Progress Notes (Signed)
Subjective:    Patient ID: [name], female    DOB: [birth year], 89 y.o.   MRN: [id number]     HPI [name] is here for follow up of her chronic medical problems.  Walking in halls.  Balance is good.  Playing bridge.  Overall anxiety is controlled-as long as she maintains her humor  Medications and allergies reviewed with patient and updated if appropriate.  Current Outpatient Medications on File Prior to Visit  Medication Sig Dispense Refill   acetaminophen (TYLENOL) 500 MG tablet Take 500-1,000 mg by mouth every 6 (six) hours as needed for mild pain or moderate pain.      alendronate (FOSAMAX) 70 MG tablet Take 1 tablet (70 mg total) by mouth every 7 (seven) days. Take with a full glass of water on an empty stomach. 12 tablet 3   ALPRAZolam (XANAX) 0.5 MG tablet TAKE 1 TABLET TWICE DAILY AS NEEDED FOR AN ANXIETY, DO NOT TAKE AT NIGHT. 180 tablet 0   apixaban (ELIQUIS) 5 MG TABS tablet Take 1 tablet (5 mg total) by mouth 2 (two) times daily. 60 tablet 5   Calcium Carb-Cholecalciferol 600-400 MG-UNIT TABS Take 1 tablet by mouth daily.     Chloral Hydrate CRYS Take 10 mls by mouth at bedtime as needed 900 g 2   Cholecalciferol (VITAMIN D-3) 1000 UNITS CAPS Take 1,000 Units by mouth daily.      clindamycin (CLEOCIN) 300 MG capsule Per patient taking prior to dental appointment     dapagliflozin propanediol (FARXIGA) 10 MG TABS tablet Take 1 tablet (10 mg total) by mouth every other day. 45 tablet 3   ferrous sulfate 325 (65 FE) MG tablet Take 325 mg by mouth 2 (two) times a week. Every Monday and Wednesday     furosemide (LASIX) 20 MG tablet Take 1 tablet by mouth daily. If greater than 3 pound gain in 24 hours, increase to 2 tablets daily until weight returns to normal 120 tablet 3   ipratropium (ATROVENT) 0.06 % nasal spray 2 sprays each nostril every 6 hours to dry nose 45 mL 2   levocetirizine (XYZAL) 5 MG tablet Take 1 tablet (5 mg total) by mouth daily as needed (Can  take an extra dose during flare ups). 180 tablet 3   Magnesium Oxide 250 MG TABS Take by mouth.     metoprolol succinate (TOPROL-XL) 25 MG 24 hr tablet Take 1 tablet (25 mg total) by mouth daily. 90 tablet 1   nitroGLYCERIN (NITROSTAT) 0.4 MG SL tablet Dissolve 1 tablet under the tongue every 5 minutes as needed for chest pain. Max of 3 doses, then 911. 25 tablet 3   pantoprazole (PROTONIX) 40 MG tablet TAKE ONE TABLET BY MOUTH TWICE DAILY 180 tablet 3   Polyethyl Glycol-Propyl Glycol (SYSTANE ULTRA) 0.4-0.3 % SOLN Place 1 drop into both eyes daily as needed (dry eyes).     rosuvastatin (CRESTOR) 20 MG tablet Take 1 tablet (20 mg total) by mouth daily. 90 tablet 3   RYALTRIS 665-25 MCG/ACT SUSP 2 sprays per nostril 1-2 times daily 29 g 5   tretinoin (RETIN-A) 0.05 % cream Apply topically.     [DISCONTINUED] nitroGLYCERIN (NITROSTAT) 0.4 MG SL tablet Place 1 tablet (0.4 mg total) under the tongue every 5 (five) minutes as needed for chest pain (CP or SOB). 25 tablet 3   No current facility-administered medications on file prior to visit.     Review of Systems  Constitutional:  Negative for fever.  Respiratory:  Positive for cough (with swallowing large pills) and shortness of breath (with afib). Negative for wheezing.   Cardiovascular:  Positive for chest pain (with stress) and leg swelling. Negative for palpitations.  Neurological:  Negative for light-headedness and headaches.       Objective:   Vitals:   04/10/23 1021  BP: 114/70  Pulse: 78  Temp: 97.9 F (36.6 C)  SpO2: 98%   BP Readings from Last 3 Encounters:  04/10/23 114/70  03/16/23 130/70  11/12/22 106/62   Wt Readings from Last 3 Encounters:  04/10/23 159 lb (72.1 kg)  04/03/23 154 lb (69.9 kg)  03/16/23 161 lb (73 kg)   Body mass index is 28.17 kg/m.    Physical Exam Constitutional:      General: She is not in acute distress.    Appearance: Normal appearance.  HENT:     Head: Normocephalic and atraumatic.   Eyes:     Conjunctiva/sclera: Conjunctivae normal.  Cardiovascular:     Rate and Rhythm: Normal rate and regular rhythm.     Heart sounds: Normal heart sounds.  Pulmonary:     Effort: Pulmonary effort is normal. No respiratory distress.     Breath sounds: Normal breath sounds. No wheezing.  Musculoskeletal:     Cervical back: Neck supple.     Right lower leg: No edema.     Left lower leg: No edema.  Lymphadenopathy:     Cervical: No cervical adenopathy.  Skin:    General: Skin is warm and dry.     Findings: No rash.  Neurological:     Mental Status: She is alert. Mental status is at baseline.  Psychiatric:        Mood and Affect: Mood normal.        Behavior: Behavior normal.        Lab Results  Component Value Date   WBC 7.1 08/10/2022   HGB 13.5 08/10/2022   HCT 41.2 08/10/2022   PLT 120 (L) 08/10/2022   GLUCOSE 101 (H) 10/20/2022   CHOL 152 08/27/2022   TRIG 69.0 08/27/2022   HDL 70.80 08/27/2022   LDLDIRECT 144.0 11/29/2012   LDLCALC 68 08/27/2022   ALT 30 08/10/2022   AST 33 08/10/2022   NA 142 10/20/2022   K 4.4 10/20/2022   CL 105 10/20/2022   CREATININE 0.90 03/06/2023   BUN 17 10/20/2022   CO2 21 10/20/2022   TSH 1.00 08/27/2022   INR 1.0 03/06/2020   HGBA1C 6.2 08/27/2022     Assessment & Plan:    See Problem List for Assessment and Plan of chronic medical problems.

## 2023-04-09 NOTE — Patient Instructions (Addendum)
      Blood work was ordered.       Medications changes include :   None      Return in about 6 months (around 10/09/2023) for Physical Exam.

## 2023-04-10 ENCOUNTER — Ambulatory Visit (INDEPENDENT_AMBULATORY_CARE_PROVIDER_SITE_OTHER): Payer: PPO | Admitting: Internal Medicine

## 2023-04-10 VITALS — BP 114/70 | HR 78 | Temp 97.9°F | Ht 63.0 in | Wt 159.0 lb

## 2023-04-10 DIAGNOSIS — E782 Mixed hyperlipidemia: Secondary | ICD-10-CM

## 2023-04-10 DIAGNOSIS — I10 Essential (primary) hypertension: Secondary | ICD-10-CM

## 2023-04-10 DIAGNOSIS — K257 Chronic gastric ulcer without hemorrhage or perforation: Secondary | ICD-10-CM

## 2023-04-10 DIAGNOSIS — I2510 Atherosclerotic heart disease of native coronary artery without angina pectoris: Secondary | ICD-10-CM

## 2023-04-10 DIAGNOSIS — I4821 Permanent atrial fibrillation: Secondary | ICD-10-CM | POA: Diagnosis not present

## 2023-04-10 DIAGNOSIS — F419 Anxiety disorder, unspecified: Secondary | ICD-10-CM

## 2023-04-10 DIAGNOSIS — F5101 Primary insomnia: Secondary | ICD-10-CM

## 2023-04-10 DIAGNOSIS — E538 Deficiency of other specified B group vitamins: Secondary | ICD-10-CM

## 2023-04-10 DIAGNOSIS — R7303 Prediabetes: Secondary | ICD-10-CM | POA: Diagnosis not present

## 2023-04-10 DIAGNOSIS — M810 Age-related osteoporosis without current pathological fracture: Secondary | ICD-10-CM

## 2023-04-10 LAB — COMPREHENSIVE METABOLIC PANEL
ALT: 19 U/L (ref 0–35)
AST: 29 U/L (ref 0–37)
Albumin: 4.4 g/dL (ref 3.5–5.2)
Alkaline Phosphatase: 79 U/L (ref 39–117)
BUN: 21 mg/dL (ref 6–23)
CO2: 28 meq/L (ref 19–32)
Calcium: 9.1 mg/dL (ref 8.4–10.5)
Chloride: 104 meq/L (ref 96–112)
Creatinine, Ser: 0.95 mg/dL (ref 0.40–1.20)
GFR: 53.63 mL/min — ABNORMAL LOW (ref >60.00)
Glucose, Bld: 86 mg/dL (ref 70–99)
Potassium: 3.9 meq/L (ref 3.5–5.1)
Sodium: 141 meq/L (ref 135–145)
Total Bilirubin: 1 mg/dL (ref 0.2–1.2)
Total Protein: 6.6 g/dL (ref 6.0–8.3)

## 2023-04-10 LAB — LIPID PANEL
Cholesterol: 174 mg/dL (ref 0–200)
HDL: 79.9 mg/dL (ref >39.00)
LDL Cholesterol: 68 mg/dL (ref 0–99)
NonHDL: 94.21
Total CHOL/HDL Ratio: 2
Triglycerides: 131 mg/dL (ref 0.0–149.0)
VLDL: 26.2 mg/dL (ref 0.0–40.0)

## 2023-04-10 LAB — CBC WITH DIFFERENTIAL/PLATELET
Basophils Absolute: 0 10*3/uL (ref 0.0–0.1)
Basophils Relative: 0.7 % (ref 0.0–3.0)
Eosinophils Absolute: 0.1 10*3/uL (ref 0.0–0.7)
Eosinophils Relative: 1.5 % (ref 0.0–5.0)
HCT: 45.4 % (ref 36.0–46.0)
Hemoglobin: 15 g/dL (ref 12.0–15.0)
Lymphocytes Relative: 40.2 % (ref 12.0–46.0)
Lymphs Abs: 2.8 10*3/uL (ref 0.7–4.0)
MCHC: 33 g/dL (ref 30.0–36.0)
MCV: 100.3 fL — ABNORMAL HIGH (ref 78.0–100.0)
Monocytes Absolute: 0.7 10*3/uL (ref 0.1–1.0)
Monocytes Relative: 9.7 % (ref 3.0–12.0)
Neutro Abs: 3.3 10*3/uL (ref 1.4–7.7)
Neutrophils Relative %: 47.9 % (ref 43.0–77.0)
Platelets: 133 10*3/uL — ABNORMAL LOW (ref 150.0–400.0)
RBC: 4.52 Mil/uL (ref 3.87–5.11)
RDW: 14.4 % (ref 11.5–15.5)
WBC: 6.9 10*3/uL (ref 4.0–10.5)

## 2023-04-10 LAB — HEMOGLOBIN A1C: Hgb A1c MFr Bld: 6.2 % (ref 4.6–6.5)

## 2023-04-10 NOTE — Assessment & Plan Note (Signed)
Chronic Regular exercise and healthy diet encouraged Lab Results  Component Value Date   LDLCALC 68 08/27/2022   Continue Crestor 20 mg daily

## 2023-04-10 NOTE — Assessment & Plan Note (Signed)
H/o PUD No gerd Continue pantoprazole 40 mg daily

## 2023-04-10 NOTE — Assessment & Plan Note (Signed)
Chronic ?Controlled, Stable ?Continue chloral hydrate 10 mL nightly ?

## 2023-04-10 NOTE — Assessment & Plan Note (Signed)
Chronic DEXA up-to-date Continue Fosamax 70 mg weekly-started 08/2021 Stressed regular exercise Continue calcium and vitamin D supplementation

## 2023-04-10 NOTE — Assessment & Plan Note (Signed)
Chronic Continue B12 supplementation 

## 2023-04-10 NOTE — Assessment & Plan Note (Signed)
Chronic Blood pressure is well-controlled cmp Continue metoprolol XL 25 mg daily

## 2023-04-10 NOTE — Assessment & Plan Note (Signed)
Chronic Following with cardiology On Eliquis 5 mg twice daily, metoprolol xl 25 mg daily Euvolemic Cmp, cbc

## 2023-04-10 NOTE — Assessment & Plan Note (Signed)
Chronic Lab Results  Component Value Date   HGBA1C 6.2 08/27/2022   Check a1c Low sugar / carb diet Stressed regular exercise

## 2023-04-10 NOTE — Assessment & Plan Note (Signed)
Chronic Following with cardiology No symptoms consistent with angina Continue current medications

## 2023-04-10 NOTE — Assessment & Plan Note (Signed)
Chronic Controlled, Stable Continue alprazolam 0.5 mg twice daily as needed

## 2023-04-11 ENCOUNTER — Encounter: Payer: Self-pay | Admitting: Internal Medicine

## 2023-04-13 ENCOUNTER — Other Ambulatory Visit (HOSPITAL_BASED_OUTPATIENT_CLINIC_OR_DEPARTMENT_OTHER): Payer: Self-pay

## 2023-04-30 ENCOUNTER — Encounter: Payer: Self-pay | Admitting: Cardiovascular Disease

## 2023-05-04 ENCOUNTER — Encounter: Payer: Self-pay | Admitting: Internal Medicine

## 2023-05-05 MED ORDER — CHLORAL HYDRATE CRYS: CRYSTALS | 2 refills | Status: DC

## 2023-05-14 ENCOUNTER — Ambulatory Visit: Payer: PPO | Admitting: Emergency Medicine

## 2023-05-14 ENCOUNTER — Encounter: Payer: Self-pay | Admitting: Emergency Medicine

## 2023-05-14 VITALS — BP 106/74 | HR 100 | Ht 65.0 in | Wt 161.6 lb

## 2023-05-14 DIAGNOSIS — R9389 Abnormal findings on diagnostic imaging of other specified body structures: Secondary | ICD-10-CM

## 2023-05-14 DIAGNOSIS — H35033 Hypertensive retinopathy, bilateral: Secondary | ICD-10-CM | POA: Diagnosis not present

## 2023-05-14 DIAGNOSIS — H43813 Vitreous degeneration, bilateral: Secondary | ICD-10-CM | POA: Diagnosis not present

## 2023-05-14 DIAGNOSIS — R918 Other nonspecific abnormal finding of lung field: Secondary | ICD-10-CM | POA: Diagnosis not present

## 2023-05-14 DIAGNOSIS — H353131 Nonexudative age-related macular degeneration, bilateral, early dry stage: Secondary | ICD-10-CM | POA: Diagnosis not present

## 2023-05-14 MED ORDER — DOXYCYCLINE HYCLATE 100 MG PO TABS: 100.0000 mg | ORAL_TABLET | Freq: Two times a day (BID) | ORAL | 0 refills | Status: DC

## 2023-05-14 NOTE — Patient Instructions (Addendum)
We reviewed your CT scans of the chest today. Please take doxycycline 100 mg twice a day for 7 days. We will repeat a CT scan of the chest without contrast in March 2025 to compare with your priors. Depending on your CT scan results we will discuss next steps in evaluation including possible diagnostic testing like bronchoscopy. Follow Dr. Delton Coombes in March after your CT chest so we can review those results together

## 2023-05-14 NOTE — Assessment & Plan Note (Signed)
Focal area of nodularity in the left lower lobe with some associated bronchiectatic change, question inflammatory.  Must also consider possible early malignancy.  She has a history of adenocarcinoma that was treated with SBRT.  She has not had any infectious prodrome or symptoms consistent with pneumonia.  I will treat her empirically with doxycycline in preparation for a repeat CT scan of the chest in March 2025.  She does not need IV contrast with this study so I will change it to no contrast.  Depending on that scan and her wishes we will decide whether to proceed with bronchoscopy to further evaluate   We reviewed your CT scans of the chest today. Please take doxycycline 100 mg twice a day for 7 days. We will repeat a CT scan of the chest without contrast in March 2025 to compare with your priors. Depending on your CT scan results we will discuss next steps in evaluation including possible diagnostic testing like bronchoscopy. Follow Dr. Delton Coombes in March after your CT chest so we can review those results together

## 2023-05-14 NOTE — Progress Notes (Signed)
Subjective:    Patient ID: [name], female    DOB: 1933-03-30, 89 y.o.   MRN: [id number]  HPI 89 year old former smoker with a history of breast cancer, hypertension, aortic valve replacemen and atrial fibrillation, GERD with a hiatal hernia and peptic ulcer disease, chronic pneumatosis of the colon.  I have seen her in the past for a right upper lobe adenocarcinoma diagnosed on navigational bronchoscopy 03/06/2020. She has continued to follow with radiation oncology and has had serial CT scans of the chest.  There has been some evolving micronodular disease in the left lower lobe, 1 cm on most recent imaging 03/06/2023. She reports today she has nasal congestion and gtt on ipratropium Occasional cough, occasional choking or swallowing problems, few times a week. No fevers, chills or infectious sx.    Review of Systems As per HPI  Past Medical History:  Diagnosis Date   Allergy    Anemia    Anxiety    Aortic valve stenosis, severe    s/p AVR // mild to mod paravalvular leakage // Echo 9/21: EF 55-60, no RWMA, mild LVH, GR 2 DD, mildly reduced RVSF, RVSP 38.7, moderate LAE, mild RAE, AVR with mean gradient 11 mmHg, mild paravalvular leakage   Arthritis    Breast cancer (HCC) 2007   right   Cataract    Constipation    Dysrhythmia    A-fib   Hiatal hernia    pt denies   Hypercholesteremia    Hypertension    Hypoglycemia    Osteopenia    Personal history of radiation therapy    Tingling    Ulcer 2012   bleeding gastric Ulcer     Family History  Problem Relation Age of Onset   Hypertension Mother    Rectal cancer Mother    Colon cancer Mother    Kidney disease Father    Cancer Cousin 49       breast and ovarian    Esophageal cancer Neg Hx    Stomach cancer Neg Hx    Colon polyps Neg Hx      Social History   Socioeconomic History   Marital status: Married    Spouse name: Francis Dowse   Number of children: 3   Years of education: 16   Highest education level:  Bachelor's degree (e.g., BA, AB, BS)  Occupational History   Occupation: Retired  Tobacco Use   Smoking status: Former    Current packs/day: 0.00    Average packs/day: 0.5 packs/day for 15.0 years (7.5 ttl pk-yrs)    Types: Cigarettes    Start date: 70    Quit date: 1986    Years since quitting: 39.0   Smokeless tobacco: Never   Tobacco comments:    quit smoking 1980  Vaping Use   Vaping status: Never Used  Substance and Sexual Activity   Alcohol use: Yes    Alcohol/week: 7.0 standard drinks of alcohol    Types: 7 Glasses of wine per week    Comment: wine nightly with dinner   Drug use: No   Sexual activity: Not Currently  Other Topics Concern   Not on file  Social History Narrative   ** Merged History Encounter **       Lives at [facility] with her husband. Right-handed. Caffeine use: 3-4 cups per day (some tea, mixes coffee with decaf).   Social Drivers of Corporate investment banker Strain: Low Risk  (04/03/2023)   Overall Physicist, medical Strain (  CARDIA)    Difficulty of Paying Living Expenses: Not hard at all  Food Insecurity: No Food Insecurity (04/03/2023)   Hunger Vital Sign    Worried About Running Out of Food in the Last Year: Never true    Ran Out of Food in the Last Year: Never true  Transportation Needs: No Transportation Needs (04/03/2023)   PRAPARE - Administrator, Civil Service (Medical): No    Lack of Transportation (Non-Medical): No  Physical Activity: Insufficiently Active (04/03/2023)   Exercise Vital Sign    Days of Exercise per Week: 7 days    Minutes of Exercise per Session: 10 min  Stress: No Stress Concern Present (04/03/2023)   [employer] of Occupational Health - Occupational Stress Questionnaire    Feeling of Stress : Not at all  Social Connections: Moderately Integrated (04/03/2023)   Social Connection and Isolation Panel [NHANES]    Frequency of Communication with Friends and Family: Three times a week    Frequency  of Social Gatherings with Friends and Family: More than three times a week    Attends Religious Services: More than 4 times per year    Active Member of [employer] or Organizations: No    Attends Banker Meetings: Never    Marital Status: Married  Catering manager Violence: Not At Risk (04/03/2023)   Humiliation, Afraid, Rape, and Kick questionnaire    Fear of Current or Ex-Partner: No    Emotionally Abused: No    Physically Abused: No    Sexually Abused: No     Allergies  Allergen Reactions   Covid-19 (Mrna) Vaccine     fever   Lactose Intolerance (Gi) Diarrhea and Nausea Only    GI upset   Amoxicillin-Pot Clavulanate Hives and Rash   Amoxicillin-Pot Clavulanate Rash   Penicillin G Benzathine Rash   Penicillins Hives and Rash    30 years   Sulfa Antibiotics Hives and Rash   Sulfonamide Derivatives Hives and Rash     Outpatient Medications Prior to Visit  Medication Sig Dispense Refill   acetaminophen (TYLENOL) 500 MG tablet Take 500-1,000 mg by mouth every 6 (six) hours as needed for mild pain or moderate pain.      alendronate (FOSAMAX) 70 MG tablet Take 1 tablet (70 mg total) by mouth every 7 (seven) days. Take with a full glass of water on an empty stomach. 12 tablet 3   ALPRAZolam (XANAX) 0.5 MG tablet TAKE 1 TABLET TWICE DAILY AS NEEDED FOR AN ANXIETY, DO NOT TAKE AT NIGHT. 180 tablet 0   apixaban (ELIQUIS) 5 MG TABS tablet Take 1 tablet (5 mg total) by mouth 2 (two) times daily. 60 tablet 5   Calcium Carb-Cholecalciferol 600-400 MG-UNIT TABS Take 1 tablet by mouth daily.     Chloral Hydrate CRYS Take 10 mls by mouth at bedtime as needed 900 g 2   Cholecalciferol (VITAMIN D-3) 1000 UNITS CAPS Take 1,000 Units by mouth daily.      dapagliflozin propanediol (FARXIGA) 10 MG TABS tablet Take 1 tablet (10 mg total) by mouth every other day. 45 tablet 3   ferrous sulfate 325 (65 FE) MG tablet Take 325 mg by mouth 2 (two) times a week. Every Monday and Wednesday      furosemide (LASIX) 20 MG tablet Take 1 tablet by mouth daily. If greater than 3 pound gain in 24 hours, increase to 2 tablets daily until weight returns to normal 120 tablet 3   ipratropium (ATROVENT)  0.06 % nasal spray 2 sprays each nostril every 6 hours to dry nose 45 mL 2   levocetirizine (XYZAL) 5 MG tablet Take 1 tablet (5 mg total) by mouth daily as needed (Can take an extra dose during flare ups). 180 tablet 3   Magnesium Oxide 250 MG TABS Take by mouth.     metoprolol succinate (TOPROL-XL) 25 MG 24 hr tablet Take 1 tablet (25 mg total) by mouth daily. 90 tablet 1   pantoprazole (PROTONIX) 40 MG tablet TAKE ONE TABLET BY MOUTH TWICE DAILY 180 tablet 3   Polyethyl Glycol-Propyl Glycol (SYSTANE ULTRA) 0.4-0.3 % SOLN Place 1 drop into both eyes daily as needed (dry eyes).     rosuvastatin (CRESTOR) 20 MG tablet Take 1 tablet (20 mg total) by mouth daily. 90 tablet 3   RYALTRIS 665-25 MCG/ACT SUSP 2 sprays per nostril 1-2 times daily 29 g 5   tretinoin (RETIN-A) 0.05 % cream Apply topically.     clindamycin (CLEOCIN) 300 MG capsule Per patient taking prior to dental appointment (Patient not taking: Reported on 05/14/2023)     nitroGLYCERIN (NITROSTAT) 0.4 MG SL tablet Dissolve 1 tablet under the tongue every 5 minutes as needed for chest pain. Max of 3 doses, then 911. (Patient not taking: Reported on 05/14/2023) 25 tablet 3   No facility-administered medications prior to visit.         Objective:   Physical Exam Vitals:   05/14/23 1414  BP: 106/74  Pulse: 100  SpO2: 93%  Weight: 161 lb 9.6 oz (73.3 kg)  Height: 5\' 5"  (1.651 m)    Gen: Pleasant, well-nourished, in no distress,  normal affect  ENT: No lesions,  mouth clear,  oropharynx clear, no postnasal drip  Neck: No JVD, no stridor  Lungs: No use of accessory muscles, no crackles or wheezing on normal respiration, no wheeze on forced expiration  Cardiovascular: RRR, heart sounds normal, no murmur or gallops, no peripheral  edema  Musculoskeletal: No deformities, no cyanosis or clubbing  Neuro: alert, awake, non focal  Skin: Warm, no lesions or rash      Assessment & Plan:  Abnormal CT of the chest Focal area of nodularity in the left lower lobe with some associated bronchiectatic change, question inflammatory.  Must also consider possible early malignancy.  She has a history of adenocarcinoma that was treated with SBRT.  She has not had any infectious prodrome or symptoms consistent with pneumonia.  I will treat her empirically with doxycycline in preparation for a repeat CT scan of the chest in March 2025.  She does not need IV contrast with this study so I will change it to no contrast.  Depending on that scan and her wishes we will decide whether to proceed with bronchoscopy to further evaluate   We reviewed your CT scans of the chest today. Please take doxycycline 100 mg twice a day for 7 days. We will repeat a CT scan of the chest without contrast in March 2025 to compare with your priors. Depending on your CT scan results we will discuss next steps in evaluation including possible diagnostic testing like bronchoscopy. Follow Dr. Delton Coombes in March after your CT chest so we can review those results together   Levy Pupa, MD, PhD 05/14/2023, 4:50 PM [facility] [phone] or if no answer before 7:00PM call [phone] For any issues after 7:00PM please call eLink [phone]

## 2023-05-19 ENCOUNTER — Other Ambulatory Visit: Payer: Self-pay | Admitting: Internal Medicine

## 2023-05-19 DIAGNOSIS — Z0000 Encounter for general adult medical examination without abnormal findings: Secondary | ICD-10-CM

## 2023-06-01 ENCOUNTER — Other Ambulatory Visit: Payer: Self-pay | Admitting: Internal Medicine

## 2023-06-11 ENCOUNTER — Other Ambulatory Visit (HOSPITAL_BASED_OUTPATIENT_CLINIC_OR_DEPARTMENT_OTHER): Payer: PPO

## 2023-06-11 ENCOUNTER — Other Ambulatory Visit: Payer: PPO

## 2023-06-15 ENCOUNTER — Ambulatory Visit: Payer: Self-pay | Admitting: Radiation Oncology

## 2023-06-17 ENCOUNTER — Other Ambulatory Visit: Payer: Self-pay | Admitting: Internal Medicine

## 2023-06-18 ENCOUNTER — Encounter: Payer: Self-pay | Admitting: Internal Medicine

## 2023-06-23 ENCOUNTER — Telehealth: Payer: Self-pay | Admitting: *Deleted

## 2023-06-23 ENCOUNTER — Other Ambulatory Visit: Payer: Self-pay | Admitting: Radiation Oncology

## 2023-06-23 DIAGNOSIS — C3411 Malignant neoplasm of upper lobe, right bronchus or lung: Secondary | ICD-10-CM

## 2023-06-23 NOTE — Telephone Encounter (Signed)
 Called patient to inform that CT order has been changed to without contrast for 07-09-23, spoke with patient and she is now aware of this

## 2023-06-26 ENCOUNTER — Ambulatory Visit
Admission: RE | Admit: 2023-06-26 | Discharge: 2023-06-26 | Disposition: A | Discharge status: Home / Self Care | Payer: PPO | Source: Ambulatory Visit | Attending: Internal Medicine | Admitting: Internal Medicine

## 2023-06-26 DIAGNOSIS — Z1231 Encounter for screening mammogram for malignant neoplasm of breast: Secondary | ICD-10-CM | POA: Diagnosis not present

## 2023-06-26 DIAGNOSIS — Z0000 Encounter for general adult medical examination without abnormal findings: Secondary | ICD-10-CM

## 2023-07-09 ENCOUNTER — Ambulatory Visit (HOSPITAL_BASED_OUTPATIENT_CLINIC_OR_DEPARTMENT_OTHER): Payer: PPO

## 2023-07-09 ENCOUNTER — Ambulatory Visit (HOSPITAL_BASED_OUTPATIENT_CLINIC_OR_DEPARTMENT_OTHER)
Admission: RE | Admit: 2023-07-09 | Discharge: 2023-07-09 | Disposition: A | Discharge status: Home / Self Care | Payer: PPO | Source: Ambulatory Visit | Attending: Radiation Oncology | Admitting: Radiation Oncology

## 2023-07-09 DIAGNOSIS — C3411 Malignant neoplasm of upper lobe, right bronchus or lung: Secondary | ICD-10-CM | POA: Insufficient documentation

## 2023-07-09 DIAGNOSIS — I700 Atherosclerosis of aorta: Secondary | ICD-10-CM | POA: Diagnosis not present

## 2023-07-09 DIAGNOSIS — I771 Stricture of artery: Secondary | ICD-10-CM | POA: Diagnosis not present

## 2023-07-09 DIAGNOSIS — C3490 Malignant neoplasm of unspecified part of unspecified bronchus or lung: Secondary | ICD-10-CM | POA: Diagnosis not present

## 2023-07-13 ENCOUNTER — Ambulatory Visit
Admission: RE | Admit: 2023-07-13 | Discharge: 2023-07-13 | Disposition: A | Discharge status: Home / Self Care | Payer: PPO | Source: Ambulatory Visit | Attending: Radiation Oncology | Admitting: Radiation Oncology

## 2023-07-13 DIAGNOSIS — C3411 Malignant neoplasm of upper lobe, right bronchus or lung: Secondary | ICD-10-CM | POA: Diagnosis not present

## 2023-07-13 DIAGNOSIS — Z87891 Personal history of nicotine dependence: Secondary | ICD-10-CM | POA: Diagnosis not present

## 2023-07-13 NOTE — Progress Notes (Addendum)
 Telephone nursing appointment for review of most recent CT scan from 07/11/2023. Meaningful use complete.  Lung Side: Right side. 04/26/20 SBRT Treatment:   Does the patient complain of any of the following: Pain:Denies Shortness of breath w/wo exertion: Denies Cough: Denies Hemoptysis: Good Pain with swallowing: She reports having trouble swalliowng sometimes. Swallowing/choking concerns: Denies Appetite: Good Energy Level: Good Post radiation skin Changes: Denies    Additional comments if applicable: Patient has questions regarding most recent CT results. Overall patient reports doing well.

## 2023-07-13 NOTE — Progress Notes (Signed)
 Radiation Oncology         ([phone]) [phone] ________________________________  Initial Outpatient Consultation - Conducted via telephone at patient request.  I spoke with the patient to conduct this  visit via telephone. The patient was notified in advance and was offered an in person or telemedicine meeting to allow for face to face communication but instead preferred to proceed with a telephone visit.   Name: [name]        MRN: [id number]  Date of Service: 07/13/2023 DOB: 05-Mar-1933  YQ:[id number], Bobette Mo, MD  Pincus Sanes, MD     REFERRING PHYSICIAN: Pincus Sanes, MD   DIAGNOSIS: The encounter diagnosis was Malignant neoplasm of upper lobe of right lung [facility]).   HISTORY OF PRESENT ILLNESS: [name] is a 89 y.o. female with a history of Stage IA2, cT1bN0M0, NSCLC, adenocarcinoma of the RUL.  The patient has a history of right breast cancer treated in [place], [place] with lumpectomy and adjuvant radiotherapy in 2006. She also has a history of valvular disease and underwent an aortic valve replacement in July 2016 by Dr. Sherie Don, as well as atrial fibrillation and diastolic CHF. Pulmonary medicine followed along with her after identifying a RUL pulmonary nodule originally identified in May 2018, the solid component became more prominent in 2021 and a bronchoscopy was performed on 03/06/2020. Biopsies and cytology of theRUL from fine-needle aspiration and brushing were consistent with malignant cells morphologically consistent with an adenocarcinoma.  Atypical cells were also seen in the right upper lobe lavage. She proceeded with stereotactic body radiotherapy (SBRT) in 3 fractions which she completed to the RUL target in December 2021.   She's been followed in surveillance since. Interestingly her surveilance scans have identified pneumatosis of the colon. She underwent work up with additional imaging and met with Dr. Meridee Score regarding these findings. A Cologuard was positive  but at the time of colonoscopy in September 2022 she had focal changes of the bowel wall consistent with her pneumatosis and a transverse polyp that was consistent with tubular adenoma. Dr. Meridee Score did not recommend repeat endoscopy.   While she has had some inflammatory findings at or after infections in the lungs, she has been stable from an oncologic perspective without recurrences. She does have chronic  pneumatosis in the splenic flexure of the colon. Her most recent scan on  07/09/23 showed stable post treatment changes involving the RUL without concerning features. She had resolution of the previously noted changes in the LLL, and no new nodules were noted. Persistent cystic pneumatosis involving the splenic flexure region of the colon were noted. She is contacted by phone to review these results.    PREVIOUS RADIATION THERAPY:    04/18/20-04/26/20 SBRT Treatment: The RUL target was treated to 54 Gy in 3 fractions  About 2006: The patient received adjuvant right breast radiotherapy. Details are not known at this point about her dosing.   PAST MEDICAL HISTORY:  Past Medical History:  Diagnosis Date   Allergy    Anemia    Anxiety    Aortic valve stenosis, severe    s/p AVR // mild to mod paravalvular leakage // Echo 9/21: EF 55-60, no RWMA, mild LVH, GR 2 DD, mildly reduced RVSF, RVSP 38.7, moderate LAE, mild RAE, AVR with mean gradient 11 mmHg, mild paravalvular leakage   Arthritis    Breast cancer (HCC) 2007   right   Cataract    Constipation    Dysrhythmia    A-fib  Hiatal hernia    pt denies   Hypercholesteremia    Hypertension    Hypoglycemia    Osteopenia    Personal history of radiation therapy    Tingling    Ulcer 2012   bleeding gastric Ulcer       PAST SURGICAL HISTORY: Past Surgical History:  Procedure Laterality Date   AORTIC VALVE REPLACEMENT N/A 11/16/2014   Procedure: AORTIC VALVE REPLACEMENT (AVR);  Surgeon: Alleen Borne, MD;  Location[facility] OR;   Service: Open Heart Surgery;  Laterality: N/A;   BIOPSY  12/15/2017   Procedure: BIOPSY;  Surgeon: Meridee Score Netty Starring., MD;  Location: [facility] ENDOSCOPY;  Service: Gastroenterology;;   BIOPSY  01/10/2021   Procedure: BIOPSY;  Surgeon: Lemar Lofty., MD;  Location: [facility] ENDOSCOPY;  Service: Gastroenterology;;   BREAST BIOPSY     BREAST LUMPECTOMY Right    2005?   BREAST SURGERY     BRONCHIAL BIOPSY  03/06/2020   Procedure: BRONCHIAL BIOPSIES;  Surgeon: Leslye Peer, MD;  Location: [facility] ENDOSCOPY;  Service: Pulmonary;;   BRONCHIAL BRUSHINGS  03/06/2020   Procedure: BRONCHIAL BRUSHINGS;  Surgeon: Leslye Peer, MD;  Location: [facility] ENDOSCOPY;  Service: Pulmonary;;   BRONCHIAL NEEDLE ASPIRATION BIOPSY  03/06/2020   Procedure: BRONCHIAL NEEDLE ASPIRATION BIOPSIES;  Surgeon: Leslye Peer, MD;  Location: [facility] ENDOSCOPY;  Service: Pulmonary;;   BRONCHIAL WASHINGS  03/06/2020   Procedure: BRONCHIAL WASHINGS;  Surgeon: Leslye Peer, MD;  Location: [facility] ENDOSCOPY;  Service: Pulmonary;;   CARDIAC CATHETERIZATION N/A 10/18/2014   Procedure: Right/Left Heart Cath and Coronary Angiography;  Surgeon: Tonny Bollman, MD;  Location: [facility] INVASIVE CV LAB;  Service: Cardiovascular;  Laterality: N/A;   CARDIOVERSION N/A 12/09/2019   Procedure: CARDIOVERSION;  Surgeon: Wendall Stade, MD;  Location: [facility] ENDOSCOPY;  Service: Cardiovascular;  Laterality: N/A;   CARDIOVERSION N/A 12/30/2019   Procedure: CARDIOVERSION;  Surgeon: Wendall Stade, MD;  Location: [facility] ENDOSCOPY;  Service: Cardiovascular;  Laterality: N/A;   COLONOSCOPY WITH PROPOFOL N/A 01/10/2021   Procedure: COLONOSCOPY WITH PROPOFOL;  Surgeon: Lemar Lofty., MD;  Location: [facility] ENDOSCOPY;  Service: Gastroenterology;  Laterality: N/A;   CORONARY ANGIOGRAPHY N/A 02/04/2019   Procedure: CORONARY ANGIOGRAPHY;  Surgeon: Yvonne Kendall, MD;  Location: [facility] INVASIVE CV LAB;  Service: Cardiovascular;  Laterality: N/A;   COSMETIC SURGERY     face    ESOPHAGOGASTRODUODENOSCOPY (EGD) WITH PROPOFOL N/A 12/15/2017   Procedure: ESOPHAGOGASTRODUODENOSCOPY (EGD) WITH PROPOFOL;  Surgeon: Meridee Score Netty Starring., MD;  Location: [facility] ENDOSCOPY;  Service: Gastroenterology;  Laterality: N/A;   FIDUCIAL MARKER PLACEMENT  03/06/2020   Procedure: FIDUCIAL MARKER PLACEMENT;  Surgeon: Leslye Peer, MD;  Location: [facility] ENDOSCOPY;  Service: Pulmonary;;   PARATHYROIDECTOMY     TEE WITHOUT CARDIOVERSION N/A 11/16/2014   Procedure: TRANSESOPHAGEAL ECHOCARDIOGRAM (TEE);  Surgeon: Alleen Borne, MD;  Location: [facility] OR;  Service: Open Heart Surgery;  Laterality: N/A;   TEE WITHOUT CARDIOVERSION N/A 12/09/2019   Procedure: TRANSESOPHAGEAL ECHOCARDIOGRAM (TEE);  Surgeon: Wendall Stade, MD;  Location: [facility] ENDOSCOPY;  Service: Cardiovascular;  Laterality: N/A;   TONSILLECTOMY     VIDEO BRONCHOSCOPY WITH ENDOBRONCHIAL NAVIGATION Right 03/06/2020   Procedure: VIDEO BRONCHOSCOPY WITH ENDOBRONCHIAL NAVIGATION;  Surgeon: Leslye Peer, MD;  Location: [facility] ENDOSCOPY;  Service: Pulmonary;  Laterality: Right;     FAMILY HISTORY:  Family History  Problem Relation Age of Onset   Hypertension Mother    Rectal cancer Mother    Colon cancer Mother  Kidney disease Father    Cancer Cousin 40       breast and ovarian    Esophageal cancer Neg Hx    Stomach cancer Neg Hx    Colon polyps Neg Hx      SOCIAL HISTORY:  reports that she quit smoking about 39 years ago. Her smoking use included cigarettes. She started smoking about 54 years ago. She has a 7.5 pack-year smoking history. She has never used smokeless tobacco. She reports current alcohol use of about 7.0 standard drinks of alcohol per week. She reports that she does not use drugs.  The patient is married and lives in [place] in independent living at [place].  She enjoys playing bridge with friends.    ALLERGIES: Covid-19 (mrna) vaccine, Lactose intolerance (gi), Amoxicillin-pot clavulanate, Amoxicillin-pot  clavulanate, Penicillin g benzathine, Penicillins, Sulfa antibiotics, and Sulfonamide derivatives   MEDICATIONS:  Current Outpatient Medications  Medication Sig Dispense Refill   acetaminophen (TYLENOL) 500 MG tablet Take 500-1,000 mg by mouth every 6 (six) hours as needed for mild pain or moderate pain.      alendronate (FOSAMAX) 70 MG tablet Take 1 tablet (70 mg total) by mouth every 7 (seven) days. Take with a full glass of water on an empty stomach. 12 tablet 3   ALPRAZolam (XANAX) 0.5 MG tablet TAKE 1 TABLET TWICE DAILY AS NEEDED FOR AN ANXIETY, DO NOT TAKE AT NIGHT. 180 tablet 0   apixaban (ELIQUIS) 5 MG TABS tablet Take 1 tablet (5 mg total) by mouth 2 (two) times daily. 60 tablet 5   Calcium Carb-Cholecalciferol 600-400 MG-UNIT TABS Take 1 tablet by mouth daily.     Chloral Hydrate CRYS Take 10 mls by mouth at bedtime as needed 900 g 2   Cholecalciferol (VITAMIN D-3) 1000 UNITS CAPS Take 1,000 Units by mouth daily.      dapagliflozin propanediol (FARXIGA) 10 MG TABS tablet Take 1 tablet (10 mg total) by mouth every other day. 45 tablet 3   ferrous sulfate 325 (65 FE) MG tablet Take 325 mg by mouth 2 (two) times a week. Every Monday and Wednesday     furosemide (LASIX) 20 MG tablet Take 1 tablet by mouth daily. If greater than 3 pound gain in 24 hours, increase to 2 tablets daily until weight returns to normal 120 tablet 3   ipratropium (ATROVENT) 0.06 % nasal spray 2 sprays each nostril every 6 hours to dry nose 45 mL 2   levocetirizine (XYZAL) 5 MG tablet Take 1 tablet (5 mg total) by mouth daily as needed (Can take an extra dose during flare ups). 180 tablet 3   Magnesium Oxide 250 MG TABS Take by mouth.     metoprolol succinate (TOPROL-XL) 25 MG 24 hr tablet Take 1 tablet (25 mg total) by mouth daily. 90 tablet 1   pantoprazole (PROTONIX) 40 MG tablet TAKE ONE TABLET BY MOUTH TWICE DAILY 180 tablet 3   Polyethyl Glycol-Propyl Glycol (SYSTANE ULTRA) 0.4-0.3 % SOLN Place 1 drop into  both eyes daily as needed (dry eyes).     rosuvastatin (CRESTOR) 20 MG tablet Take 1 tablet (20 mg total) by mouth daily. 90 tablet 3   RYALTRIS 665-25 MCG/ACT SUSP 2 sprays per nostril 1-2 times daily 29 g 5   tretinoin (RETIN-A) 0.05 % cream Apply topically.     clindamycin (CLEOCIN) 300 MG capsule Per patient taking prior to dental appointment (Patient not taking: Reported on 07/13/2023)     doxycycline (VIBRA-TABS) 100 MG tablet  Take 1 tablet (100 mg total) by mouth 2 (two) times daily. (Patient not taking: Reported on 07/13/2023) 14 tablet 0   nitroGLYCERIN (NITROSTAT) 0.4 MG SL tablet Dissolve 1 tablet under the tongue every 5 minutes as needed for chest pain. Max of 3 doses, then 911. (Patient not taking: Reported on 07/13/2023) 25 tablet 3   No current facility-administered medications for this encounter.     REVIEW OF SYSTEMS: On review of systems, the patient reports she still has severe allergies but has her symptoms pretty well controlled as of late. She denies any recent respiratory illness or cough, she feels like she has good appetite and denies any chest wall pain. No other complaints are verbalized.     PHYSICAL EXAM:  Unable to assess due to encounter type.   ECOG = 1  0 - Asymptomatic (Fully active, able to carry on all predisease activities without restriction)  1 - Symptomatic but completely ambulatory (Restricted in physically strenuous activity but ambulatory and able to carry out work of a light or sedentary nature. For example, light housework, office work)  2 - Symptomatic, <50% in bed during the day (Ambulatory and capable of all self care but unable to carry out any work activities. Up and about more than 50% of waking hours)  3 - Symptomatic, >50% in bed, but not bedbound (Capable of only limited self-care, confined to bed or chair 50% or more of waking hours)  4 - Bedbound (Completely disabled. Cannot carry on any self-care. Totally confined to bed or  chair)  5 - Death   Santiago Glad MM, Creech RH, Tormey DC, et al. [phone]). "Toxicity and response criteria of the [facility]". Am. Evlyn Clines. Oncol. 5 (6): 649-55    LABORATORY DATA:  Lab Results  Component Value Date   WBC 6.9 04/10/2023   HGB 15.0 04/10/2023   HCT 45.4 04/10/2023   MCV 100.3 (H) 04/10/2023   PLT 133.0 (L) 04/10/2023   Lab Results  Component Value Date   NA 141 04/10/2023   K 3.9 04/10/2023   CL 104 04/10/2023   CO2 28 04/10/2023   Lab Results  Component Value Date   ALT 19 04/10/2023   AST 29 04/10/2023   ALKPHOS 79 04/10/2023   BILITOT 1.0 04/10/2023      RADIOGRAPHY: CT Chest Wo Contrast Result Date: 07/11/2023 CLINICAL DATA:  Non-small cell lung cancer restaging. * Tracking Code: BO * EXAM: CT CHEST WITHOUT CONTRAST TECHNIQUE: Multidetector CT imaging of the chest was performed following the standard protocol without IV contrast. RADIATION DOSE REDUCTION: This exam was performed according to the departmental dose-optimization program which includes automated exposure control, adjustment of the mA and/or kV according to patient size and/or use of iterative reconstruction technique. COMPARISON:  Multiple prior chest CTs. The most recent is 03/06/2023 FINDINGS: Cardiovascular: The heart is normal in size. No pericardial effusion. Stable tortuosity and calcification of the thoracic aorta. Stable surgical changes from coronary artery bypass surgery. Stable surgical changes from aortic valve replacement surgery. Mediastinum/Nodes: Stable scattered borderline mediastinal and hilar lymph nodes no new or progressive findings. The esophagus is unremarkable. Thyroid gland appears normal. Lungs/Pleura: Stable post treatment changes involving the right upper lobe without findings suspicious for recurrent tumor. The nodular airspace process in the left lower lobe seen on the prior CT scan has resolved and was likely inflammatory infectious. No new pulmonary  nodules or lesions. No pleural effusions or pleural nodules. Stable sub 5 mm subpleural nodules at  the left lung base unchanged since 2023 and considered benign lymph nodes. Upper Abdomen: No significant upper abdominal findings. Persistent changes of cystic pneumatosis involving the splenic flexure region of colon. Musculoskeletal: No significant bony findings. IMPRESSION: 1. Stable post treatment changes involving the right upper lobe without findings suspicious for recurrent tumor. 2. The nodular airspace process in the left lower lobe seen on the prior CT scan has resolved and was likely inflammatory infectious. 3. Stable scattered borderline mediastinal and hilar lymph nodes. 4. Persistent changes of cystic pneumatosis involving the splenic flexure region of colon. 5. Aortic atherosclerosis. Electronically Signed   By: Rudie Meyer M.D.   On: 07/11/2023 18:38   MM 3D SCREENING MAMMOGRAM BILATERAL BREAST Result Date: 06/30/2023 CLINICAL DATA:  Screening. EXAM: DIGITAL SCREENING BILATERAL MAMMOGRAM WITH TOMOSYNTHESIS AND CAD TECHNIQUE: Bilateral screening digital craniocaudal and mediolateral oblique mammograms were obtained. Bilateral screening digital breast tomosynthesis was performed. The images were evaluated with computer-aided detection. COMPARISON:  Previous exam(s). ACR Breast Density Category b: There are scattered areas of fibroglandular density. FINDINGS: There are no findings suspicious for malignancy. IMPRESSION: No mammographic evidence of malignancy. A result letter of this screening mammogram will be mailed directly to the patient. RECOMMENDATION: Screening mammogram in one year. (Code:SM-B-01Y) BI-RADS CATEGORY  1: Negative. Electronically Signed   By: Emmaline Kluver M.D.   On: 06/30/2023 14:35        IMPRESSION/PLAN: 1. Stage IA2, cT1bN0M0, NSCLC, adenocarcinoma of the RUL. We discussed the findings from her recent CT scan and continue surveillance at 6 months per NCCN guidelines.  She is in agreement with this plan and will contact me sooner if she has questions or concerns prior to her next visit. She would like to cancel her upcoming appointment with Dr. Delton Coombes since her LLL findings have resolved. I'll copy him as well.  2.  Pneumatosis of the splenic flexure. This has been followed expectantly and we would defer any need for management to Dr. Meridee Score in GI. She understands the need to proceed with evaluation if she developed acute symptoms of pain, fever, or abrupt GI symptoms.     This encounter was conducted via telephone.  The patient has provided two factor identification and has given verbal consent for this type of encounter and has been advised to only accept a meeting of this type in a secure network environment. The time spent during this encounter was including preparation, discussion, and coordination of the patient's care. The attendants for this meeting include  Ronny Bacon  and Randa Evens L Legrande.  During the encounter,  Ronny Bacon was located remotely at home. [name] was located at home.      Osker Mason, PAC

## 2023-07-14 ENCOUNTER — Other Ambulatory Visit: Payer: Self-pay | Admitting: Radiation Oncology

## 2023-07-14 DIAGNOSIS — C3411 Malignant neoplasm of upper lobe, right bronchus or lung: Secondary | ICD-10-CM

## 2023-07-20 ENCOUNTER — Encounter: Payer: Self-pay | Admitting: Internal Medicine

## 2023-07-20 DIAGNOSIS — T17320D Food in larynx causing asphyxiation, subsequent encounter: Secondary | ICD-10-CM

## 2023-07-23 ENCOUNTER — Telehealth (HOSPITAL_COMMUNITY): Payer: Self-pay | Admitting: *Deleted

## 2023-07-23 NOTE — Telephone Encounter (Signed)
 Attempted to call patient to schedule OP MBS. Left VM. RKEEL

## 2023-07-30 ENCOUNTER — Ambulatory Visit: Payer: PPO | Admitting: Emergency Medicine

## 2023-08-03 ENCOUNTER — Other Ambulatory Visit: Payer: Self-pay | Admitting: Internal Medicine

## 2023-08-18 DIAGNOSIS — N760 Acute vaginitis: Secondary | ICD-10-CM | POA: Diagnosis not present

## 2023-09-08 ENCOUNTER — Telehealth: Payer: Self-pay | Admitting: *Deleted

## 2023-09-08 NOTE — Telephone Encounter (Signed)
 CALLED PATIENT TO INFORM OF CT FOR 12-29-23- ARRIVAL TIME- 3 PM @ [facility], NO RESTRICTIONS TO SCAN, PATIENT TO RECEIVE RESULTS FROM ALISON PERKINS ON 01-04-24 @ 1 PM VIA TELEPHONE, LVM FOR A RETURN CALL

## 2023-09-10 ENCOUNTER — Ambulatory Visit (HOSPITAL_COMMUNITY)
Admission: RE | Admit: 2023-09-10 | Discharge: 2023-09-10 | Disposition: A | Discharge status: Home / Self Care | Payer: PPO | Source: Ambulatory Visit | Attending: Cardiovascular Disease | Admitting: Cardiovascular Disease

## 2023-09-10 ENCOUNTER — Ambulatory Visit: Payer: PPO | Attending: Cardiovascular Disease | Admitting: Cardiovascular Disease

## 2023-09-10 ENCOUNTER — Encounter: Payer: Self-pay | Admitting: Cardiovascular Disease

## 2023-09-10 VITALS — BP 120/80 | HR 79 | Ht 65.0 in | Wt 157.4 lb

## 2023-09-10 DIAGNOSIS — Z952 Presence of prosthetic heart valve: Secondary | ICD-10-CM | POA: Diagnosis not present

## 2023-09-10 DIAGNOSIS — I4821 Permanent atrial fibrillation: Secondary | ICD-10-CM | POA: Insufficient documentation

## 2023-09-10 DIAGNOSIS — I5032 Chronic diastolic (congestive) heart failure: Secondary | ICD-10-CM | POA: Insufficient documentation

## 2023-09-10 DIAGNOSIS — T8203XD Leakage of heart valve prosthesis, subsequent encounter: Secondary | ICD-10-CM | POA: Insufficient documentation

## 2023-09-10 DIAGNOSIS — I2510 Atherosclerotic heart disease of native coronary artery without angina pectoris: Secondary | ICD-10-CM | POA: Diagnosis not present

## 2023-09-10 DIAGNOSIS — I25119 Atherosclerotic heart disease of native coronary artery with unspecified angina pectoris: Secondary | ICD-10-CM | POA: Diagnosis not present

## 2023-09-10 LAB — ECHOCARDIOGRAM COMPLETE
AV Mean grad: 10 mmHg
AV Peak grad: 13.8 mmHg
Ao pk vel: 1.86 m/s
MV M vel: 5.09 m/s
MV Peak grad: 103.4 mmHg
P 1/2 time: 502 ms
S' Lateral: 2.5 cm

## 2023-09-10 NOTE — Patient Instructions (Signed)
 Follow-Up: At [facility], you and your health needs are our priority.  As part of our continuing mission to provide you with exceptional heart care, our providers are all part of one team.  This team includes your primary Cardiologist (physician) and Advanced Practice Providers or APPs (Physician Assistants and Nurse Practitioners) who all work together to provide you with the care you need, when you need it.  Your next appointment:   6 month(s)  Provider:   Arnoldo Lapping, MD

## 2023-09-10 NOTE — Progress Notes (Signed)
 Cardiology Office Note:    Date:  09/10/2023   ID:  [name], DOB 01/29/1933, MRN [id number]  PCP:  Colene Dauphin, MD   [facility] Providers Cardiologist:  Arnoldo Lapping, MD     Referring MD: Colene Dauphin, MD   Chief Complaint  Patient presents with   Atrial Fibrillation    History of Present Illness:    [name] is a 89 y.o. female with a hx of:  Persistent atrial fibrillation  S/p TEE-DCCV 11/2019 CHA2DS2-VASc=6 (age x 2, female, CAD, CHF, HTN) >> Apixaban    Failed cardioversion with amiodarone  in 2022, now with persistent atrial fibrillation Bicuspid valve, aortic stenosis S/p bioprosthetic AVR in 2016 Paravalvular leak Normal coronary arteries prior to AVR Coronary artery disease S/p NSTEMI in 01/2019 >> total occlusion of distal LAD felt to be prob embolic event Ischemic CM, EF 45-50 w ant-apical AK TEE 8/21: EF 55 Breast CA Hypertension  Hyperlipidemia  Diastolic CHF   The patient is here alone today.  She continues to be under good bit of stress with the health of her 3 year old husband, [name].  She otherwise is doing pretty well.  She keeps a pretty busy social calendar at [facility] where she lives.  She stays active and has mild exertional dyspnea but otherwise no change reported in her symptoms.  She did not have any chest pain, chest pressure, orthopnea, PND, or leg swelling.  She has been able to tolerate Farxiga  with every other day dosing working well for her.  She had a lot of side effects with daily dosing of Jardiance  and Farxiga .  Current Medications: Current Meds  Medication Sig   acetaminophen  (TYLENOL ) 500 MG tablet Take 500-1,000 mg by mouth every 6 (six) hours as needed for mild pain or moderate pain.    alendronate  (FOSAMAX ) 70 MG tablet Take 1 tablet (70 mg total) by mouth every 7 (seven) days. Take with a full glass of water  on an empty stomach.   ALPRAZolam  (XANAX ) 0.5 MG tablet TAKE 1 TABLET TWICE DAILY AS  NEEDED FOR AN ANXIETY, DO NOT TAKE AT NIGHT.   apixaban  (ELIQUIS ) 5 MG TABS tablet Take 1 tablet (5 mg total) by mouth 2 (two) times daily.   Calcium  Carb-Cholecalciferol 600-400 MG-UNIT TABS Take 1 tablet by mouth daily.   Chloral Hydrate CRYS Take 10 mls by mouth at bedtime as needed   Cholecalciferol (VITAMIN D -3) 1000 UNITS CAPS Take 1,000 Units by mouth daily.    clindamycin  (CLEOCIN ) 300 MG capsule Per patient taking prior to dental appointment   dapagliflozin  propanediol (FARXIGA ) 10 MG TABS tablet Take 1 tablet (10 mg total) by mouth every other day.   ferrous sulfate 325 (65 FE) MG tablet Take 325 mg by mouth 2 (two) times a week. Every Monday and Wednesday   furosemide  (LASIX ) 20 MG tablet Take 1 tablet by mouth daily. If greater than 3 pound gain in 24 hours, increase to 2 tablets daily until weight returns to normal   ipratropium (ATROVENT ) 0.06 % nasal spray 2 sprays each nostril every 6 hours to dry nose   levocetirizine (XYZAL ) 5 MG tablet Take 1 tablet (5 mg total) by mouth daily as needed (Can take an extra dose during flare ups).   Magnesium  Oxide 250 MG TABS Take by mouth.   metoprolol  succinate (TOPROL -XL) 25 MG 24 hr tablet Take 1 tablet (25 mg total) by mouth daily.   nitroGLYCERIN  (NITROSTAT ) 0.4 MG SL tablet Dissolve 1 tablet under  the tongue every 5 minutes as needed for chest pain. Max of 3 doses, then 911.   pantoprazole  (PROTONIX ) 40 MG tablet TAKE ONE TABLET BY MOUTH TWICE DAILY   Polyethyl Glycol-Propyl Glycol (SYSTANE ULTRA) 0.4-0.3 % SOLN Place 1 drop into both eyes daily as needed (dry eyes).   rosuvastatin  (CRESTOR ) 20 MG tablet Take 1 tablet (20 mg total) by mouth daily.   tretinoin (RETIN-A) 0.05 % cream Apply topically.   [DISCONTINUED] doxycycline  (VIBRA -TABS) 100 MG tablet Take 1 tablet (100 mg total) by mouth 2 (two) times daily.   [DISCONTINUED] RYALTRIS  665-25 MCG/ACT SUSP 2 sprays per nostril 1-2 times daily     Allergies:   Covid-19 (mrna) vaccine,  Lactose intolerance (gi), Amoxicillin-pot clavulanate, Amoxicillin-pot clavulanate, Penicillin g benzathine, Penicillins, Sulfa antibiotics, and Sulfonamide derivatives   ROS:   Please see the history of present illness.    All other systems reviewed and are negative.  EKGs/Labs/Other Studies Reviewed:    The following studies were reviewed today: Cardiac Studies & Procedures   ______________________________________________________________________________________________ CARDIAC CATHETERIZATION  CARDIAC CATHETERIZATION 02/04/2019  Conclusion Conclusions: 1. Severe single-vessel coronary artery disease with occlusion of the distal LAD.  Otherwise, there is mild disease involving the proximal LAD and ostial diagonal branch.  Question if this represents acute plaque rupture versus coronary embolism. 2. No significant CAD involving the LCx and RCA.  Recommendations: 1. Medical therapy including dual antiplatelet therapy with aspirin  and clopidogrel  for up to 12 months.  Distal LAD at site of occlusion is very small and not well-suited for stent placement. 2. Aggressive secondary prevention. 3. Follow-up echocardiogram with particular attention to aortic valve prosthesis to exclude thrombus.  Sammy Crisp, MD [facility] Pager: [phone]  Findings Coronary Findings Diagnostic  Dominance: Right  Left Main Vessel is large. Vessel is angiographically normal.  Left Anterior Descending Vessel is large. Mid LAD lesion is 15% stenosed. The lesion is eccentric. Dist LAD lesion is 100% stenosed. The lesion is thrombotic.  First Diagonal Branch Vessel is large in size. 1st Diag lesion is 20% stenosed.  Left Circumflex Vessel is large. Vessel is angiographically normal.  First Obtuse Marginal Branch Vessel is small in size.  Second Obtuse Marginal Branch Vessel is large in size.  Third Obtuse Marginal Branch Vessel is small in size.  Right Coronary Artery Vessel is  angiographically normal.  Intervention  No interventions have been documented.     ECHOCARDIOGRAM  ECHOCARDIOGRAM COMPLETE 09/16/2022  Narrative ECHOCARDIOGRAM REPORT    Patient Name:   [name] Date of Exam: 09/16/2022 Medical Rec #:  [id number]         Height:       64.2 in Accession #:    [id number]        Weight:       160.0 lb Date of Birth:  Aug 28, 1932        BSA:          1.783 m Patient Age:    89 years          BP:           125/87 mmHg Patient Gender: F                 HR:           87 bpm. Exam Location:  [address]  Procedure: 2D Echo, Cardiac Doppler, Color Doppler and Strain Analysis  Indications:    I48.2 Atrial Fibrillation  History:  Patient has prior history of Echocardiogram examinations, most recent 06/07/2021. CHF, NSTEMI and CAD, Breast cancer; Risk Factors:Hypertension and HLD. Aortic Valve: 21 mm Magna Ease bioprosthetic valve is present in the aortic position. Procedure Date: 2016.  Sonographer:    Juventino Oppenheim RCS Referring Phys: [phone] Keilynn Marano  IMPRESSIONS   1. Left ventricular ejection fraction, by estimation, is 60 to 65%. The left ventricle has normal function. The left ventricle has no regional wall motion abnormalities. Left ventricular diastolic parameters are indeterminate. The average left ventricular global longitudinal strain is -13.2 %. The global longitudinal strain is abnormal. 2. Right ventricular systolic function is normal. The right ventricular size is mildly enlarged. There is mildly elevated pulmonary artery systolic pressure. The estimated right ventricular systolic pressure is 40.7 mmHg. 3. Left atrial size was moderately dilated. 4. Right atrial size was severely dilated. 5. The mitral valve is normal in structure. No evidence of mitral valve regurgitation. No evidence of mitral stenosis. There is mild holosystolic prolapse of both leaflets of the mitral valve. 6. Tricuspid valve regurgitation is  moderate. 7. The aortic valve has been repaired/replaced. Aortic valve regurgitation is mild. No aortic stenosis is present. There is a 21 mm Magna Ease bioprosthetic valve present in the aortic position. Procedure Date: 2016. Echo findings are consistent with normal structure and function of the aortic valve prosthesis. Aortic regurgitation PHT measures 395 msec. Aortic valve mean gradient measures 7.8 mmHg. Aortic valve Vmax measures 1.96 m/s. 8. The inferior vena cava is normal in size with greater than 50% respiratory variability, suggesting right atrial pressure of 3 mmHg.  FINDINGS Left Ventricle: Left ventricular ejection fraction, by estimation, is 60 to 65%. The left ventricle has normal function. The left ventricle has no regional wall motion abnormalities. The average left ventricular global longitudinal strain is -13.2 %. The global longitudinal strain is abnormal. The left ventricular internal cavity size was normal in size. There is no left ventricular hypertrophy. Left ventricular diastolic parameters are indeterminate.  Right Ventricle: The right ventricular size is mildly enlarged. No increase in right ventricular wall thickness. Right ventricular systolic function is normal. There is mildly elevated pulmonary artery systolic pressure. The tricuspid regurgitant velocity is 2.86 m/s, and with an assumed right atrial pressure of 8 mmHg, the estimated right ventricular systolic pressure is 40.7 mmHg.  Left Atrium: Left atrial size was moderately dilated.  Right Atrium: Right atrial size was severely dilated.  Pericardium: There is no evidence of pericardial effusion.  Mitral Valve: The mitral valve is normal in structure. There is mild holosystolic prolapse of both leaflets of the mitral valve. No evidence of mitral valve regurgitation. No evidence of mitral valve stenosis.  Tricuspid Valve: The tricuspid valve is normal in structure. Tricuspid valve regurgitation is moderate . No  evidence of tricuspid stenosis.  Aortic Valve: The aortic valve has been repaired/replaced. Aortic valve regurgitation is mild. Aortic regurgitation PHT measures 395 msec. No aortic stenosis is present. Aortic valve mean gradient measures 7.8 mmHg. Aortic valve peak gradient measures 15.4 mmHg. Aortic valve area, by VTI measures 0.77 cm. There is a 21 mm Magna Ease bioprosthetic valve present in the aortic position. Procedure Date: 2016. Echo findings are consistent with normal structure and function of the aortic valve prosthesis.  Pulmonic Valve: The pulmonic valve was normal in structure. Pulmonic valve regurgitation is not visualized. No evidence of pulmonic stenosis.  Aorta: The aortic root is normal in size and structure.  Venous: The inferior vena cava is normal in  size with greater than 50% respiratory variability, suggesting right atrial pressure of 3 mmHg.  IAS/Shunts: No atrial level shunt detected by color flow Doppler.   LEFT VENTRICLE PLAX 2D LVIDd:         4.10 cm   Diastology LVIDs:         2.70 cm   LV e' medial:    8.49 cm/s LV PW:         1.00 cm   LV E/e' medial:  12.3 LV IVS:        1.10 cm   LV e' lateral:   13.40 cm/s LVOT diam:     1.70 cm   LV E/e' lateral: 7.8 LV SV:         29 LV SV Index:   16        2D Longitudinal Strain LVOT Area:     2.27 cm  2D Strain GLS (A2C):   -12.1 % 2D Strain GLS (A3C):   -14.7 % 2D Strain GLS (A4C):   -12.8 % 2D Strain GLS Avg:     -13.2 %  RIGHT VENTRICLE RV Basal diam:  4.50 cm RV Mid diam:    3.60 cm RV S prime:     8.05 cm/s TAPSE (M-mode): 1.6 cm RVSP:           40.7 mmHg  LEFT ATRIUM             Index        RIGHT ATRIUM           Index LA diam:        5.10 cm 2.86 cm/m   RA Pressure: 8.00 mmHg LA Vol (A2C):   75.5 ml 42.36 ml/m  RA Area:     30.10 cm LA Vol (A4C):   84.2 ml 47.24 ml/m  RA Volume:   109.00 ml 61.15 ml/m LA Biplane Vol: 85.6 ml 48.02 ml/m AORTIC VALVE AV Area (Vmax):    0.71 cm AV Area  (Vmean):   0.79 cm AV Area (VTI):     0.77 cm AV Vmax:           196.00 cm/s AV Vmean:          135.000 cm/s AV VTI:            0.377 m AV Peak Grad:      15.4 mmHg AV Mean Grad:      7.8 mmHg LVOT Vmax:         60.97 cm/s LVOT Vmean:        47.100 cm/s LVOT VTI:          0.128 m LVOT/AV VTI ratio: 0.34 AI PHT:            395 msec  AORTA Ao Root diam: 2.80 cm Ao Asc diam:  2.80 cm  MITRAL VALVE                TRICUSPID VALVE MV Area (PHT):              TR Peak grad:   32.7 mmHg MV Decel Time:              TR Vmax:        286.00 cm/s MV E velocity: 104.70 cm/s  Estimated RAP:  8.00 mmHg RVSP:           40.7 mmHg  SHUNTS Systemic VTI:  0.13 m Systemic Diam: 1.70 cm  Dorothye Gathers MD Electronically signed by Lavonia Powers  Skains MD Signature Date/Time: 09/16/2022/2:55:28 PM    Final   TEE  ECHO TEE 12/09/2019  Narrative TRANSESOPHOGEAL ECHO REPORT    Patient Name:   [name] [facility] Date of Exam: 12/09/2019 Medical Rec #:  [id number]             Height:       65.0 in Accession #:    [id number]            Weight:       163.6 lb Date of Birth:  1932-11-04            BSA:          1.816 m Patient Age:    86 years              BP:           134/84 mmHg Patient Gender: F                     HR:           122 bpm. Exam Location:  Inpatient  Procedure: TEE-Intraopertive  Indications:    Afib  History:        Patient has prior history of Echocardiogram examinations. Aortic Valve Disease; Arrythmias:Atrial Fibrillation.  Sonographer:    Crissie Dome RDCS (AE) Referring Phys: [id number] MARK C SKAINS  PROCEDURE: The transesophogeal probe was passed without difficulty through the esophogus of the patient. Sedation performed by different physician. The patient developed no complications during the procedure.  IMPRESSIONS   1. Extensive 3 D rending done of AVR. 2. No LAA thormbus DCC x 1 200 J converted to NSR. 3. Left ventricular ejection fraction, by estimation, is 55%. The  left ventricle has normal function. The left ventricle has no regional wall motion abnormalities. There is mild left ventricular hypertrophy. Left ventricular diastolic function could not be evaluated. 4. Right ventricular systolic function is moderately reduced. The right ventricular size is moderately enlarged. 5. Left atrial size was severely dilated. No left atrial/left atrial appendage thrombus was detected. 6. Right atrial size was severely dilated. 7. The mitral valve is normal in structure. Mild mitral valve regurgitation. 8. Tricuspid valve regurgitation is moderate to severe. 9. Post AVR 2016 with 21 mm [facility] Ease stented pericardial tissue valve Mean gradient 8 mmHg Moderate PVL 1:00 on BSA views may be related to suture dehiscence as she had a bicuspid AV prior to implant . The aortic valve has been repaired/replaced. Aortic valve regurgitation is moderate.  FINDINGS Left Ventricle: Left ventricular ejection fraction, by estimation, is 55%. The left ventricle has normal function. The left ventricle has no regional wall motion abnormalities. The left ventricular internal cavity size was normal in size. There is mild left ventricular hypertrophy. Left ventricular diastolic function could not be evaluated.  Right Ventricle: The right ventricular size is moderately enlarged. Right vetricular wall thickness was not assessed. Right ventricular systolic function is moderately reduced.  Left Atrium: Left atrial size was severely dilated. No left atrial/left atrial appendage thrombus was detected.  Right Atrium: Right atrial size was severely dilated.  Pericardium: There is no evidence of pericardial effusion.  Mitral Valve: The mitral valve is normal in structure. There is mild thickening of the mitral valve leaflet(s). There is mild calcification of the mitral valve leaflet(s). Mild mitral annular calcification. Mild mitral valve regurgitation.  Tricuspid Valve: The tricuspid  valve is normal in structure. Tricuspid valve regurgitation is moderate to severe.  Aortic Valve:  Post AVR 2016 with 21 mm [facility] Ease stented pericardial tissue valve Mean gradient 8 mmHg Moderate PVL 1:00 on BSA views may be related to suture dehiscence as she had a bicuspid AV prior to implant. The aortic valve has been repaired/replaced. Aortic valve regurgitation is moderate. Aortic valve mean gradient measures 8.0 mmHg. Aortic valve peak gradient measures 15.7 mmHg. There is a 21 mm Edwards valve present in the aortic position.  Pulmonic Valve: The pulmonic valve was normal in structure. Pulmonic valve regurgitation is mild.  Aorta: The aortic root is normal in size and structure.  IAS/Shunts: No atrial level shunt detected by color flow Doppler.  Additional Comments: Extensive 3 D rending done of AVR. No LAA thormbus DCC x 1 200 J converted to NSR.   AORTIC VALVE AV Vmax:      198.00 cm/s AV Vmean:     124.000 cm/s AV VTI:       0.321 m AV Peak Grad: 15.7 mmHg AV Mean Grad: 8.0 mmHg  Janelle Mediate MD Electronically signed by Janelle Mediate MD Signature Date/Time: 12/09/2019/9:50:10 AM    Final  MONITORS  LONG TERM MONITOR (3-14 DAYS) 02/14/2020  Narrative 1. The basic rhythm is sinus bradycardia with an average HR of 49 bpm 2. No atrial fibrillation or flutter 3. No high-grade heart block or pathologic pauses 4. There are rare PVC's and occasional supraventricular beats without sustained arrhythmias.  PAC burden is 2.2%.       ______________________________________________________________________________________________      EKG:   EKG Interpretation Date/Time:  Thursday Sep 10 2023 11:27:20 EDT Ventricular Rate:  79 PR Interval:    QRS Duration:  84 QT Interval:  402 QTC Calculation: 460 R Axis:   6  Text Interpretation: Atrial fibrillation Nonspecific T wave abnormality When compared with ECG of 04-Apr-2021 10:25, No significant change was found  Confirmed by Arnoldo Lapping [id number]) on 09/10/2023 11:37:39 AM    Recent Labs: 04/10/2023: ALT 19; BUN 21; Creatinine, Ser 0.95; Hemoglobin 15.0; Platelets 133.0; Potassium 3.9; Sodium 141  Recent Lipid Panel    Component Value Date/Time   CHOL 174 04/10/2023 1150   TRIG 131.0 04/10/2023 1150   HDL 79.90 04/10/2023 1150   CHOLHDL 2 04/10/2023 1150   VLDL 26.2 04/10/2023 1150   LDLCALC 68 04/10/2023 1150   LDLDIRECT 144.0 11/29/2012 1601     Risk Assessment/Calculations:    CHA2DS2-VASc Score =     This indicates a  % annual risk of stroke. The patient's score is based upon:                Physical Exam:    VS:  BP 120/80   Pulse 79   Ht 5\' 5"  (1.651 m)   Wt 157 lb 6.4 oz (71.4 kg)   BMI 26.19 kg/m     Wt Readings from Last 3 Encounters:  09/10/23 157 lb 6.4 oz (71.4 kg)  05/14/23 161 lb 9.6 oz (73.3 kg)  04/10/23 159 lb (72.1 kg)     GEN:  Well nourished, well developed in no acute distress HEENT: Normal NECK: No JVD; No carotid bruits LYMPHATICS: No lymphadenopathy CARDIAC: Irregularly irregular with a 2/6 diastolic decrescendo murmur best heard at the left lower sternal border RESPIRATORY:  Clear to auscultation without rales, wheezing or rhonchi  ABDOMEN: Soft, non-tender, non-distended MUSCULOSKELETAL:  No edema; No deformity  SKIN: Warm and dry NEUROLOGIC:  Alert and oriented x 3 PSYCHIATRIC:  Normal affect   Assessment & Plan Chronic heart  failure with preserved ejection fraction [facility]) The patient appears clinically stable.  She just had an echocardiogram done this morning with the formal interpretation currently pending.  I personally reviewed her echo images and she appears to have normal LV systolic function with normal LVEF.  She will remain on furosemide  and Farxiga  at current doses.  I will see her back in 6 months. Permanent atrial fibrillation (HCC) Heart rate well-controlled on metoprolol  succinate.  Tolerating anticoagulation with apixaban   without bleeding problems. Coronary artery disease involving native coronary artery of native heart without angina pectoris The patient is clinically stable.  She has no anginal symptoms.  She is not on antiplatelet therapy because of chronic oral anticoagulation.  She is tolerating the beta-blocker well.  She is also tolerating rosuvastatin  without significant side effects. Paravalvular leak of prosthetic heart valve, subsequent encounter The patient's echo images are reviewed.  She continues to demonstrate findings suggestive of mild to moderate aortic insufficiency.  I compared today's images to her echo from 1 year ago and I do not appreciate any change.  Her exam remains consistent with mild to moderate paravalvular regurgitation as well.  Continue annual surveillance with serial echo studies.      Medication Adjustments/Labs and Tests Ordered: Current medicines are reviewed at length with the patient today.  Concerns regarding medicines are outlined above.  Orders Placed This Encounter  Procedures   EKG 12-Lead   No orders of the defined types were placed in this encounter.   Patient Instructions  Follow-Up: At [facility], you and your health needs are our priority.  As part of our continuing mission to provide you with exceptional heart care, our providers are all part of one team.  This team includes your primary Cardiologist (physician) and Advanced Practice Providers or APPs (Physician Assistants and Nurse Practitioners) who all work together to provide you with the care you need, when you need it.  Your next appointment:   6 month(s)  Provider:   Arnoldo Lapping, MD       Signed, Arnoldo Lapping, MD  09/10/2023 12:44 PM    [facility]

## 2023-09-10 NOTE — Assessment & Plan Note (Signed)
 Heart rate well-controlled on metoprolol  succinate.  Tolerating anticoagulation with apixaban  without bleeding problems.

## 2023-09-10 NOTE — Assessment & Plan Note (Signed)
 The patient is clinically stable.  She has no anginal symptoms.  She is not on antiplatelet therapy because of chronic oral anticoagulation.  She is tolerating the beta-blocker well.  She is also tolerating rosuvastatin  without significant side effects.

## 2023-09-10 NOTE — Assessment & Plan Note (Signed)
 The patient appears clinically stable.  She just had an echocardiogram done this morning with the formal interpretation currently pending.  I personally reviewed her echo images and she appears to have normal LV systolic function with normal LVEF.  She will remain on furosemide  and Farxiga  at current doses.  I will see her back in 6 months.

## 2023-09-18 DIAGNOSIS — H43813 Vitreous degeneration, bilateral: Secondary | ICD-10-CM | POA: Diagnosis not present

## 2023-09-18 DIAGNOSIS — H35033 Hypertensive retinopathy, bilateral: Secondary | ICD-10-CM | POA: Diagnosis not present

## 2023-09-18 DIAGNOSIS — H04123 Dry eye syndrome of bilateral lacrimal glands: Secondary | ICD-10-CM | POA: Diagnosis not present

## 2023-09-18 DIAGNOSIS — H353131 Nonexudative age-related macular degeneration, bilateral, early dry stage: Secondary | ICD-10-CM | POA: Diagnosis not present

## 2023-09-22 ENCOUNTER — Other Ambulatory Visit: Payer: Self-pay | Admitting: Internal Medicine

## 2023-09-22 ENCOUNTER — Other Ambulatory Visit: Payer: Self-pay | Admitting: Cardiovascular Disease

## 2023-09-22 DIAGNOSIS — I5032 Chronic diastolic (congestive) heart failure: Secondary | ICD-10-CM

## 2023-09-22 DIAGNOSIS — I4821 Permanent atrial fibrillation: Secondary | ICD-10-CM

## 2023-09-22 DIAGNOSIS — T8203XD Leakage of heart valve prosthesis, subsequent encounter: Secondary | ICD-10-CM

## 2023-09-22 DIAGNOSIS — I2510 Atherosclerotic heart disease of native coronary artery without angina pectoris: Secondary | ICD-10-CM

## 2023-10-17 ENCOUNTER — Telehealth: Payer: Self-pay | Admitting: Physician Assistant

## 2023-10-17 NOTE — Telephone Encounter (Signed)
 Patient called this morning stating that she has had having a lot of aggravation with her husband, and says that her stomach is acting up again and she is afraid she may get an ulcer which she has had before.  She is having some intermittent epigastric discomfort denies any severe pain no nausea or vomiting, no melena or hematochezia.  She has not been seen by GI for the past 3 years, she is uncertain if she is on a PPI currently  She is asking that something be called in to [facility] and she would like to be seen in the office if possible next week  I will call in Protonix  40 mg p.o. daily #30 no refills  If you can please forward this to your POD I could not get the pod to add  Just that one of the nurses call her on Monday and try to get her in with an APP thank you

## 2023-10-19 NOTE — Progress Notes (Unsigned)
 [facility]   Telephone:(336) [phone] Fax:(336) [phone]    Patient Care Team: Geofm Glade PARAS, MD as PCP - General (Internal Medicine) Wonda Sharper, MD as PCP - Cardiology (Cardiology) Pyrtle, Gordy HERO, MD as Consulting Physician (Gastroenterology) Wonda Sharper, MD as Consulting Physician (Cardiology) Odean Potts, MD as Consulting Physician (Hematology and Oncology) Onita Duos, MD as Consulting Physician (Neurology) Mansouraty, Aloha Raddle., MD as Consulting Physician (Gastroenterology) Fate Morna SAILOR, [facility] (Inactive) as Pharmacist (Pharmacist) Geofm Glade PARAS, MD as Consulting Physician (Internal Medicine) Charmayne Molly, MD as Consulting Physician (Ophthalmology) Nonah Camellia ORN, MD as Consulting Physician (Optometry)   CHIEF COMPLAINT: Long term Survivorship for h/o right breast cancer   Oncology History  History of right breast cancer  02/16/2004 Initial Diagnosis   Right breast mass biopsy: Invasive mammary cancer, MRI revealed 1.3 cm mass upper inner quadrant right breast   03/05/2004 Surgery   Right lumpectomy: 0.9 cm grade 1 invasive lobular cancer, ER positive, PR negative, HER-2 negative, Ki-67 5%, sentinel lymph nodes negative   04/08/2004 - 05/09/2004 Radiation Therapy   Adjuvant radiation therapy   06/10/2004 - 06/09/2009 Anti-estrogen oral therapy   MA-27 trial and placed on the Aromasin arm.  She completed one year of Aromasin, and it was discontinued due to severe joint aches and pains, followed by Tamoxifen for 2-3 years, and then Letrozole to complete out a total of 5 years      CURRENT THERAPY: Surveillance   INTERVAL HISTORY [name] returns for follow up as scheduled. Last seen by me 10/14/22.  She is doing well physically with no specific complaints except she thinks she is getting an ulcer from the stress of her husband's declining condition.  She has had bleeding ulcers before but denies obvious blood in stool.  Stools are dark  but she takes oral iron  every other day.  Scheduled to see GI and PCP soon. Denies worsening fatigue or shortness of breath which is exertional at baseline she attributes to A-fib.  Denies breast concerns such as new lump/mass, nipple discharge or inversion, or skin change, new bone pain, or other specific complaints.  ROS  All other systems reviewed and negative  Past Medical History:  Diagnosis Date   Allergy    Anemia    Anxiety    Aortic valve stenosis, severe    s/p AVR // mild to mod paravalvular leakage // Echo 9/21: EF 55-60, no RWMA, mild LVH, GR 2 DD, mildly reduced RVSF, RVSP 38.7, moderate LAE, mild RAE, AVR with mean gradient 11 mmHg, mild paravalvular leakage   Arthritis    Breast cancer (HCC) 2007   right   Cataract    Constipation    Dysrhythmia    A-fib   Hiatal hernia    pt denies   Hypercholesteremia    Hypertension    Hypoglycemia    Osteopenia    Personal history of radiation therapy    Tingling    Ulcer 2012   bleeding gastric Ulcer     Past Surgical History:  Procedure Laterality Date   AORTIC VALVE REPLACEMENT N/A 11/16/2014   Procedure: AORTIC VALVE REPLACEMENT (AVR);  Surgeon: Dorise MARLA Fellers, MD;  Location: [facility] OR;  Service: Open Heart Surgery;  Laterality: N/A;   BIOPSY  12/15/2017   Procedure: BIOPSY;  Surgeon: Wilhelmenia Aloha Raddle., MD;  Location: [facility] ENDOSCOPY;  Service: Gastroenterology;;   BIOPSY  01/10/2021   Procedure: BIOPSY;  Surgeon: Wilhelmenia Aloha Raddle., MD;  Location:  [facility] ENDOSCOPY;  Service: Gastroenterology;;   BREAST BIOPSY     BREAST LUMPECTOMY Right    2005?   BREAST SURGERY     BRONCHIAL BIOPSY  03/06/2020   Procedure: BRONCHIAL BIOPSIES;  Surgeon: Shelah Lamar RAMAN, MD;  Location: [facility] ENDOSCOPY;  Service: Pulmonary;;   BRONCHIAL BRUSHINGS  03/06/2020   Procedure: BRONCHIAL BRUSHINGS;  Surgeon: Shelah Lamar RAMAN, MD;  Location: [facility] ENDOSCOPY;  Service: Pulmonary;;   BRONCHIAL NEEDLE ASPIRATION BIOPSY  03/06/2020   Procedure: BRONCHIAL  NEEDLE ASPIRATION BIOPSIES;  Surgeon: Shelah Lamar RAMAN, MD;  Location: [facility] ENDOSCOPY;  Service: Pulmonary;;   BRONCHIAL WASHINGS  03/06/2020   Procedure: BRONCHIAL WASHINGS;  Surgeon: Shelah Lamar RAMAN, MD;  Location: [facility] ENDOSCOPY;  Service: Pulmonary;;   CARDIAC CATHETERIZATION N/A 10/18/2014   Procedure: Right/Left Heart Cath and Coronary Angiography;  Surgeon: Ozell Fell, MD;  Location: [facility] INVASIVE CV LAB;  Service: Cardiovascular;  Laterality: N/A;   CARDIOVERSION N/A 12/09/2019   Procedure: CARDIOVERSION;  Surgeon: Delford Maude BROCKS, MD;  Location: [facility] ENDOSCOPY;  Service: Cardiovascular;  Laterality: N/A;   CARDIOVERSION N/A 12/30/2019   Procedure: CARDIOVERSION;  Surgeon: Delford Maude BROCKS, MD;  Location: [facility] ENDOSCOPY;  Service: Cardiovascular;  Laterality: N/A;   COLONOSCOPY WITH PROPOFOL  N/A 01/10/2021   Procedure: COLONOSCOPY WITH PROPOFOL ;  Surgeon: Wilhelmenia Aloha Raddle., MD;  Location: [facility] ENDOSCOPY;  Service: Gastroenterology;  Laterality: N/A;   CORONARY ANGIOGRAPHY N/A 02/04/2019   Procedure: CORONARY ANGIOGRAPHY;  Surgeon: Mady Bruckner, MD;  Location: [facility] INVASIVE CV LAB;  Service: Cardiovascular;  Laterality: N/A;   COSMETIC SURGERY     face   ESOPHAGOGASTRODUODENOSCOPY (EGD) WITH PROPOFOL  N/A 12/15/2017   Procedure: ESOPHAGOGASTRODUODENOSCOPY (EGD) WITH PROPOFOL ;  Surgeon: Wilhelmenia Aloha Raddle., MD;  Location: [facility] ENDOSCOPY;  Service: Gastroenterology;  Laterality: N/A;   FIDUCIAL MARKER PLACEMENT  03/06/2020   Procedure: FIDUCIAL MARKER PLACEMENT;  Surgeon: Shelah Lamar RAMAN, MD;  Location: [facility] ENDOSCOPY;  Service: Pulmonary;;   PARATHYROIDECTOMY     TEE WITHOUT CARDIOVERSION N/A 11/16/2014   Procedure: TRANSESOPHAGEAL ECHOCARDIOGRAM (TEE);  Surgeon: Dorise MARLA Fellers, MD;  Location: [facility] OR;  Service: Open Heart Surgery;  Laterality: N/A;   TEE WITHOUT CARDIOVERSION N/A 12/09/2019   Procedure: TRANSESOPHAGEAL ECHOCARDIOGRAM (TEE);  Surgeon: Delford Maude BROCKS, MD;  Location: [facility] ENDOSCOPY;   Service: Cardiovascular;  Laterality: N/A;   TONSILLECTOMY     VIDEO BRONCHOSCOPY WITH ENDOBRONCHIAL NAVIGATION Right 03/06/2020   Procedure: VIDEO BRONCHOSCOPY WITH ENDOBRONCHIAL NAVIGATION;  Surgeon: Shelah Lamar RAMAN, MD;  Location: [facility] ENDOSCOPY;  Service: Pulmonary;  Laterality: Right;     Outpatient Encounter Medications as of 10/20/2023  Medication Sig   acetaminophen  (TYLENOL ) 500 MG tablet Take 500-1,000 mg by mouth every 6 (six) hours as needed for mild pain or moderate pain.    alendronate  (FOSAMAX ) 70 MG tablet Take 1 tablet (70 mg total) by mouth every 7 (seven) days. Take with a full glass of water  on an empty stomach.   ALPRAZolam  (XANAX ) 0.5 MG tablet TAKE 1 TABLET TWICE DAILY AS NEEDED FOR AN ANXIETY, DO NOT TAKE AT NIGHT.   apixaban  (ELIQUIS ) 5 MG TABS tablet Take 1 tablet (5 mg total) by mouth 2 (two) times daily.   Calcium  Carb-Cholecalciferol 600-400 MG-UNIT TABS Take 1 tablet by mouth daily.   Chloral Hydrate CRYS Take 10 mls by mouth at bedtime as needed   Cholecalciferol (VITAMIN D -3) 1000 UNITS CAPS Take 1,000 Units by mouth daily.    clindamycin  (CLEOCIN ) 300 MG capsule Per patient taking prior to  dental appointment   dapagliflozin  propanediol (FARXIGA ) 10 MG TABS tablet Take 1 tablet (10 mg total) by mouth every other day.   ferrous sulfate 325 (65 FE) MG tablet Take 325 mg by mouth 2 (two) times a week. Every Monday and Wednesday   furosemide  (LASIX ) 20 MG tablet Take 1 tablet by mouth daily. If greater than 3 pound gain in 24 hours, increase to 2 tablets daily until weight returns to normal   ipratropium (ATROVENT ) 0.06 % nasal spray 2 sprays each nostril every 6 hours to dry nose   levocetirizine (XYZAL ) 5 MG tablet Take 1 tablet (5 mg total) by mouth daily as needed (Can take an extra dose during flare ups).   Magnesium  Oxide 250 MG TABS Take by mouth.   metoprolol  succinate (TOPROL -XL) 25 MG 24 hr tablet Take 1 tablet (25 mg total) by mouth daily.   nitroGLYCERIN   (NITROSTAT ) 0.4 MG SL tablet Dissolve 1 tablet under the tongue every 5 minutes as needed for chest pain. Max of 3 doses, then 911.   pantoprazole  (PROTONIX ) 40 MG tablet TAKE ONE TABLET BY MOUTH TWICE DAILY   Polyethyl Glycol-Propyl Glycol (SYSTANE ULTRA) 0.4-0.3 % SOLN Place 1 drop into both eyes daily as needed (dry eyes).   rosuvastatin  (CRESTOR ) 20 MG tablet Take 1 tablet (20 mg total) by mouth daily.   tretinoin (RETIN-A) 0.05 % cream Apply topically.   [DISCONTINUED] nitroGLYCERIN  (NITROSTAT ) 0.4 MG SL tablet Place 1 tablet (0.4 mg total) under the tongue every 5 (five) minutes as needed for chest pain (CP or SOB).   No facility-administered encounter medications on file as of 10/20/2023.     Today's Vitals   10/20/23 1015 10/20/23 1058  BP: 132/78   Pulse: (!) 110   Resp: 17   Temp: 97.8 F (36.6 C)   SpO2: 98%   Weight: 157 lb (71.2 kg)   PainSc:  0-No pain   Body mass index is 26.13 kg/m.   ECOG PERFORMANCE STATUS: 0 - Asymptomatic  PHYSICAL EXAM GENERAL:alert, no distress and comfortable SKIN: no rash  EYES: sclera clear NECK: without mass LYMPH:  no palpable cervical or supraclavicular lymphadenopathy  LUNGS: clear with normal breathing effort HEART: regular rate & rhythm, no lower extremity edema ABDOMEN: abdomen soft, non-tender and normal bowel sounds NEURO: alert & oriented x 3 with fluent speech, no focal motor/sensory deficits Breast exam: No palpable mass or nodularity in either breast or axilla that I could appreciate   CBC    Latest Ref Rng & Units 04/10/2023   11:50 AM 08/10/2022   11:03 AM 02/18/2022   11:08 AM  CBC  WBC 4.0 - 10.5 K/uL 6.9  7.1  6.0   Hemoglobin 12.0 - 15.0 g/dL 84.9  86.4  85.8   Hematocrit 36.0 - 46.0 % 45.4  41.2  42.8   Platelets 150.0 - 400.0 K/uL 133.0  120  128.0       CMP     Latest Ref Rng & Units 04/10/2023   11:50 AM 03/06/2023   10:57 AM 11/28/2022   11:43 AM  CMP  Glucose 70 - 99 mg/dL 86     BUN 6 - 23  mg/dL 21     Creatinine 9.59 - 1.20 mg/dL 9.04  9.09  8.99   Sodium 135 - 145 mEq/L 141     Potassium 3.5 - 5.1 mEq/L 3.9     Chloride 96 - 112 mEq/L 104     CO2 19 - 32 mEq/L  28     Calcium  8.4 - 10.5 mg/dL 9.1     Total Protein 6.0 - 8.3 g/dL 6.6     Total Bilirubin 0.2 - 1.2 mg/dL 1.0     Alkaline Phos 39 - 117 U/L 79     AST 0 - 37 U/L 29     ALT 0 - 35 U/L 19         ASSESSMENT & PLAN:PamelaSABRA Rosales is a pleasant 89 y.o. female with history of stage IA right breast invasive lobular carcinoma, ER+/PR+/HER2-, diagnosed in 2005, treated in [place]  with lumpectomy, adjuvant radiation therapy, and anti-estrogen therapy x5 years completed in 2011.    1.  History of lobular breast cancer: [name] is clinically doing well.  Breast exam is benign, mammogram 06/26/2023 was benign, recent labs were unremarkable.  Overall no clinical concern for recurrence.  She prefers to continue long-term follow-up in our survivorship clinic, given no other provider does her breast exams.  She will return in 1 year.  She knows to contact the office sooner if she develops new concerns or questions.   2.  Right lung adenocarcinoma/NSCLC, [id number], stage I (02/2020) -She was not felt to be a good surgical candidate, Dr. Shelah referred for RT -S/p SBRT per Dr. Dewey and Donald Husband, PA - Last scan 06/2023 showed no active disease  3.  A-fib, ?  CHF  -She reports 3-year history of A-fib, her main presentation is irregular rhythm and leg discomfort.  She is not aware of a heart failure diagnosis. -She reports a 4 pound weight gain in 1 week, which is consistent with CHF -Last echo 05/2021 with stable   4. Colon pneumatosis -seen on f/up CT chest 09/2020 for treated R lung cancer and confirmed on colonoscopy 01/10/2021 by Dr. Wilhelmenia -No plans to repeat colonoscopy due to age -Thinks she is getting an ulcer from stress, on PPI and Pepcid .  Stools are dark likely from oral iron .  I gave her a stool card in  the event she would like to check for blood.  She has GI appointment coming up soon   5. Bone health: Last DEXA 08/28/2021 showed osteopenia with high FRAX score, she began Fosamax .  Continue calcium , vitamin D , and weightbearing exercise   6. Health maintenance and wellness promotion: [name] was encouraged to consume 5-7 servings of fruits and vegetables per day and engage in exercise.  She was instructed to limit her alcohol consumption and continue to abstain from tobacco use      PLAN: -Recent mammogram and labs reviewed -Continue breast cancer surveillance with annual visit and mammogram in February -Follow-up with GI for suspected ulcer and PCP for routine health -Return stool card to lab (or she can defer to GI during upcoming office visit)  Orders Placed This Encounter  Procedures   Occult blood card to lab, stool      All questions were answered. The patient knows to call the clinic with any problems, questions or concerns. No barriers to learning were detected. I spent 20 minutes counseling the patient face to face. The total time spent in the appointment was 30 minutes and more than 50% was on counseling, review of test results, and coordination of care.   Karoline Fleer K Abdiel Blackerby, NP 10/20/2023

## 2023-10-19 NOTE — Telephone Encounter (Signed)
 Patient tells me she is okay and taking pantoprazole  and famotidine . She states Amy Ever was very helpful and she is grateful for her advice.  Patient agrees to schedule with Harlene for 11/04/23 at 1:30 pm. Asks to schedule her follow up visit with Dr Wilhelmenia 12/18/23 at 10:30 am.

## 2023-10-20 ENCOUNTER — Encounter: Payer: Self-pay | Admitting: Nurse Practitioner

## 2023-10-20 ENCOUNTER — Inpatient Hospital Stay: Payer: PPO | Attending: Nurse Practitioner | Admitting: Nurse Practitioner

## 2023-10-20 VITALS — BP 132/78 | HR 110 | Temp 97.8°F | Resp 17 | Wt 157.0 lb

## 2023-10-20 DIAGNOSIS — Z923 Personal history of irradiation: Secondary | ICD-10-CM | POA: Diagnosis not present

## 2023-10-20 DIAGNOSIS — Z853 Personal history of malignant neoplasm of breast: Secondary | ICD-10-CM | POA: Insufficient documentation

## 2023-10-20 DIAGNOSIS — K6389 Other specified diseases of intestine: Secondary | ICD-10-CM | POA: Insufficient documentation

## 2023-10-20 DIAGNOSIS — I4891 Unspecified atrial fibrillation: Secondary | ICD-10-CM | POA: Insufficient documentation

## 2023-10-20 DIAGNOSIS — R195 Other fecal abnormalities: Secondary | ICD-10-CM | POA: Diagnosis not present

## 2023-10-20 DIAGNOSIS — M8580 Other specified disorders of bone density and structure, unspecified site: Secondary | ICD-10-CM | POA: Diagnosis not present

## 2023-10-25 NOTE — Progress Notes (Unsigned)
 Subjective:    Patient ID: [name], female    DOB: 12-20-1932, 89 y.o.   MRN: [id number]     HPI Jalyssa is here for follow up of her chronic medical problems.  Had a bad stomach day a couple of weeks ago.  Called GI - was advised to take pepcid  daily prn.  That has helped.  Not retaining water .  Eating a little less - has lost a little weight.   Plays bridge.  Tries to walk 15-20 minutes in hall.    Medications and allergies reviewed with patient and updated if appropriate.  Current Outpatient Medications on File Prior to Visit  Medication Sig Dispense Refill   acetaminophen  (TYLENOL ) 500 MG tablet Take 500-1,000 mg by mouth every 6 (six) hours as needed for mild pain or moderate pain.      alendronate  (FOSAMAX ) 70 MG tablet Take 1 tablet (70 mg total) by mouth every 7 (seven) days. Take with a full glass of water  on an empty stomach. 12 tablet 3   ALPRAZolam  (XANAX ) 0.5 MG tablet TAKE 1 TABLET TWICE DAILY AS NEEDED FOR AN ANXIETY, DO NOT TAKE AT NIGHT. 180 tablet 0   apixaban  (ELIQUIS ) 5 MG TABS tablet Take 1 tablet (5 mg total) by mouth 2 (two) times daily. 60 tablet 5   Calcium  Carb-Cholecalciferol 600-400 MG-UNIT TABS Take 1 tablet by mouth daily.     Chloral Hydrate CRYS Take 10 mls by mouth at bedtime as needed 900 g 2   Cholecalciferol (VITAMIN D -3) 1000 UNITS CAPS Take 1,000 Units by mouth daily.      clindamycin  (CLEOCIN ) 300 MG capsule Per patient taking prior to dental appointment     dapagliflozin  propanediol (FARXIGA ) 10 MG TABS tablet Take 1 tablet (10 mg total) by mouth every other day. 45 tablet 3   ferrous sulfate 325 (65 FE) MG tablet Take 325 mg by mouth 2 (two) times a week. Every Monday and Wednesday     furosemide  (LASIX ) 20 MG tablet Take 1 tablet by mouth daily. If greater than 3 pound gain in 24 hours, increase to 2 tablets daily until weight returns to normal 120 tablet 3   ipratropium (ATROVENT ) 0.06 % nasal spray 2 sprays each nostril  every 6 hours to dry nose 45 mL 2   levocetirizine (XYZAL ) 5 MG tablet Take 1 tablet (5 mg total) by mouth daily as needed (Can take an extra dose during flare ups). 180 tablet 3   Magnesium  Oxide 250 MG TABS Take by mouth.     metoprolol  succinate (TOPROL -XL) 25 MG 24 hr tablet Take 1 tablet (25 mg total) by mouth daily. 90 tablet 3   nitroGLYCERIN  (NITROSTAT ) 0.4 MG SL tablet Dissolve 1 tablet under the tongue every 5 minutes as needed for chest pain. Max of 3 doses, then 911. 25 tablet 3   pantoprazole  (PROTONIX ) 40 MG tablet TAKE ONE TABLET BY MOUTH TWICE DAILY 180 tablet 3   Polyethyl Glycol-Propyl Glycol (SYSTANE ULTRA) 0.4-0.3 % SOLN Place 1 drop into both eyes daily as needed (dry eyes).     rosuvastatin  (CRESTOR ) 20 MG tablet Take 1 tablet (20 mg total) by mouth daily. 90 tablet 3   tretinoin (RETIN-A) 0.05 % cream Apply topically.     [DISCONTINUED] nitroGLYCERIN  (NITROSTAT ) 0.4 MG SL tablet Place 1 tablet (0.4 mg total) under the tongue every 5 (five) minutes as needed for chest pain (CP or SOB). 25 tablet 3   No current  facility-administered medications on file prior to visit.     Review of Systems  Constitutional:  Negative for fever.  Respiratory:  Positive for shortness of breath (only on incline). Negative for cough and wheezing.   Cardiovascular:  Negative for chest pain, palpitations and leg swelling.  Gastrointestinal:        GERD intermittently  Neurological:  Positive for light-headedness (more than usual). Negative for headaches.       Objective:   Vitals:   10/27/23 1052  BP: 122/74  Pulse: 70  Temp: 98.1 F (36.7 C)  SpO2: 95%   BP Readings from Last 3 Encounters:  10/27/23 122/74  10/20/23 132/78  09/10/23 120/80   Wt Readings from Last 3 Encounters:  10/27/23 155 lb (70.3 kg)  10/20/23 157 lb (71.2 kg)  09/10/23 157 lb 6.4 oz (71.4 kg)   Body mass index is 25.79 kg/m.    Physical Exam Constitutional:      General: She is not in acute  distress.    Appearance: Normal appearance.  HENT:     Head: Normocephalic and atraumatic.   Eyes:     Conjunctiva/sclera: Conjunctivae normal.    Cardiovascular:     Rate and Rhythm: Normal rate and regular rhythm.     Heart sounds: Normal heart sounds.  Pulmonary:     Effort: Pulmonary effort is normal. No respiratory distress.     Breath sounds: Normal breath sounds. No wheezing.   Musculoskeletal:     Cervical back: Neck supple.     Right lower leg: No edema.     Left lower leg: No edema.  Lymphadenopathy:     Cervical: No cervical adenopathy.   Skin:    General: Skin is warm and dry.     Findings: No rash.   Neurological:     Mental Status: She is alert. Mental status is at baseline.   Psychiatric:        Mood and Affect: Mood normal.        Behavior: Behavior normal.        Lab Results  Component Value Date   WBC 6.9 04/10/2023   HGB 15.0 04/10/2023   HCT 45.4 04/10/2023   PLT 133.0 (L) 04/10/2023   GLUCOSE 86 04/10/2023   CHOL 174 04/10/2023   TRIG 131.0 04/10/2023   HDL 79.90 04/10/2023   LDLDIRECT 144.0 11/29/2012   LDLCALC 68 04/10/2023   ALT 19 04/10/2023   AST 29 04/10/2023   NA 141 04/10/2023   K 3.9 04/10/2023   CL 104 04/10/2023   CREATININE 0.95 04/10/2023   BUN 21 04/10/2023   CO2 28 04/10/2023   TSH 1.00 08/27/2022   INR 1.0 03/06/2020   HGBA1C 6.2 04/10/2023     Assessment & Plan:    See Problem List for Assessment and Plan of chronic medical problems.

## 2023-10-25 NOTE — Patient Instructions (Incomplete)
      Blood work was ordered.       Medications changes include :   None    A referral was ordered and someone will call you to schedule an appointment.     No follow-ups on file.

## 2023-10-27 ENCOUNTER — Ambulatory Visit: Payer: Self-pay | Admitting: Internal Medicine

## 2023-10-27 ENCOUNTER — Ambulatory Visit (INDEPENDENT_AMBULATORY_CARE_PROVIDER_SITE_OTHER): Payer: PPO | Admitting: Internal Medicine

## 2023-10-27 ENCOUNTER — Encounter: Payer: Self-pay | Admitting: Internal Medicine

## 2023-10-27 ENCOUNTER — Telehealth: Payer: Self-pay | Admitting: *Deleted

## 2023-10-27 VITALS — BP 122/74 | HR 70 | Temp 98.1°F | Ht 65.0 in | Wt 155.0 lb

## 2023-10-27 DIAGNOSIS — E538 Deficiency of other specified B group vitamins: Secondary | ICD-10-CM

## 2023-10-27 DIAGNOSIS — F5101 Primary insomnia: Secondary | ICD-10-CM | POA: Diagnosis not present

## 2023-10-27 DIAGNOSIS — F419 Anxiety disorder, unspecified: Secondary | ICD-10-CM

## 2023-10-27 DIAGNOSIS — I10 Essential (primary) hypertension: Secondary | ICD-10-CM | POA: Diagnosis not present

## 2023-10-27 DIAGNOSIS — I4821 Permanent atrial fibrillation: Secondary | ICD-10-CM

## 2023-10-27 DIAGNOSIS — E782 Mixed hyperlipidemia: Secondary | ICD-10-CM | POA: Diagnosis not present

## 2023-10-27 DIAGNOSIS — I2510 Atherosclerotic heart disease of native coronary artery without angina pectoris: Secondary | ICD-10-CM

## 2023-10-27 DIAGNOSIS — R7303 Prediabetes: Secondary | ICD-10-CM

## 2023-10-27 DIAGNOSIS — M810 Age-related osteoporosis without current pathological fracture: Secondary | ICD-10-CM

## 2023-10-27 LAB — LIPID PANEL
Cholesterol: 182 mg/dL (ref 0–200)
HDL: 84 mg/dL (ref >39.00)
LDL Cholesterol: 78 mg/dL (ref 0–99)
NonHDL: 98.48
Total CHOL/HDL Ratio: 2
Triglycerides: 101 mg/dL (ref 0.0–149.0)
VLDL: 20.2 mg/dL (ref 0.0–40.0)

## 2023-10-27 LAB — COMPREHENSIVE METABOLIC PANEL WITH GFR
ALT: 22 U/L (ref 0–35)
AST: 29 U/L (ref 0–37)
Albumin: 4.5 g/dL (ref 3.5–5.2)
Alkaline Phosphatase: 68 U/L (ref 39–117)
BUN: 19 mg/dL (ref 6–23)
CO2: 30 meq/L (ref 19–32)
Calcium: 9.6 mg/dL (ref 8.4–10.5)
Chloride: 102 meq/L (ref 96–112)
Creatinine, Ser: 0.96 mg/dL (ref 0.40–1.20)
GFR: 52.76 mL/min — ABNORMAL LOW (ref >60.00)
Glucose, Bld: 91 mg/dL (ref 70–99)
Potassium: 4.4 meq/L (ref 3.5–5.1)
Sodium: 140 meq/L (ref 135–145)
Total Bilirubin: 0.9 mg/dL (ref 0.2–1.2)
Total Protein: 6.7 g/dL (ref 6.0–8.3)

## 2023-10-27 LAB — CBC WITH DIFFERENTIAL/PLATELET
Basophils Absolute: 0.1 10*3/uL (ref 0.0–0.1)
Basophils Relative: 0.8 % (ref 0.0–3.0)
Eosinophils Absolute: 0.1 10*3/uL (ref 0.0–0.7)
Eosinophils Relative: 0.9 % (ref 0.0–5.0)
HCT: 45.2 % (ref 36.0–46.0)
Hemoglobin: 14.9 g/dL (ref 12.0–15.0)
Lymphocytes Relative: 40.1 % (ref 12.0–46.0)
Lymphs Abs: 2.4 10*3/uL (ref 0.7–4.0)
MCHC: 32.9 g/dL (ref 30.0–36.0)
MCV: 97.7 fl (ref 78.0–100.0)
Monocytes Absolute: 0.6 10*3/uL (ref 0.1–1.0)
Monocytes Relative: 10.1 % (ref 3.0–12.0)
Neutro Abs: 2.9 10*3/uL (ref 1.4–7.7)
Neutrophils Relative %: 48.1 % (ref 43.0–77.0)
Platelets: 118 10*3/uL — ABNORMAL LOW (ref 150.0–400.0)
RBC: 4.63 Mil/uL (ref 3.87–5.11)
RDW: 14.1 % (ref 11.5–15.5)
WBC: 6.1 10*3/uL (ref 4.0–10.5)

## 2023-10-27 LAB — VITAMIN D 25 HYDROXY (VIT D DEFICIENCY, FRACTURES): VITD: 65.2 ng/mL (ref 30.00–100.00)

## 2023-10-27 LAB — VITAMIN B12: Vitamin B-12: 1157 pg/mL — ABNORMAL HIGH (ref 211–911)

## 2023-10-27 LAB — HEMOGLOBIN A1C: Hgb A1c MFr Bld: 6.2 % (ref 4.6–6.5)

## 2023-10-27 NOTE — Assessment & Plan Note (Addendum)
 Chronic Following with cardiology On Eliquis  5 mg twice daily, metoprolol  xl 25 mg daily Euvolemic Cmp, cbc, TSH

## 2023-10-27 NOTE — Assessment & Plan Note (Signed)
 Chronic Blood pressure is well-controlled cmp Continue metoprolol XL 25 mg daily

## 2023-10-27 NOTE — Assessment & Plan Note (Signed)
 Chronic Lab Results  Component Value Date   HGBA1C 6.2 04/10/2023   Check a1c Low sugar / carb diet Stressed regular exercise

## 2023-10-27 NOTE — Assessment & Plan Note (Signed)
Chronic Following with cardiology No symptoms consistent with angina Continue current medications

## 2023-10-27 NOTE — Assessment & Plan Note (Signed)
 Chronic Continue B12 supplementation Check B12 level

## 2023-10-27 NOTE — Assessment & Plan Note (Signed)
Chronic Controlled, Stable Continue alprazolam 0.5 mg twice daily as needed

## 2023-10-27 NOTE — Assessment & Plan Note (Addendum)
 Chronic DEXA up-to-date Continue Fosamax  70 mg weekly-started 08/2021-she does have GERD and this may be flaring her GERD up some so discussed switching this to Prolia given her CKD and GERD-will look into insurance coverage Stressed regular exercise Continue calcium  and vitamin D  supplementation Check vitamin D  level

## 2023-10-27 NOTE — Assessment & Plan Note (Signed)
 Chronic Regular exercise and healthy diet encouraged Lab Results  Component Value Date   LDLCALC 68 04/10/2023   Continue Crestor  20 mg daily Check lipid panel

## 2023-10-27 NOTE — Assessment & Plan Note (Signed)
Chronic ?Controlled, Stable ?Continue chloral hydrate 10 mL nightly ?

## 2023-10-27 NOTE — Telephone Encounter (Signed)
 RETURNED PATIENT'S PHONE CALL, LVM FOR A RETURN CALL

## 2023-10-28 ENCOUNTER — Other Ambulatory Visit: Payer: Self-pay

## 2023-10-28 ENCOUNTER — Telehealth: Payer: Self-pay

## 2023-10-28 ENCOUNTER — Other Ambulatory Visit (HOSPITAL_COMMUNITY): Payer: Self-pay

## 2023-10-28 DIAGNOSIS — M810 Age-related osteoporosis without current pathological fracture: Secondary | ICD-10-CM

## 2023-10-28 MED ORDER — DENOSUMAB 60 MG/ML ~~LOC~~ SOSY: 60.0000 mg | PREFILLED_SYRINGE | Freq: Once | SUBCUTANEOUS | Status: AC

## 2023-10-28 NOTE — Telephone Encounter (Signed)
 Prolia VOB initiated via [email or web address]  Next Prolia inj DUE: NEW START

## 2023-10-28 NOTE — Telephone Encounter (Signed)
 Pt ready for scheduling for PROLIA on or after : 10/28/23  Option# 1: Buy/Bill (Office supplied medication)  Out-of-pocket cost due at time of clinic visit: $332  Number of injection/visits approved: ---  Primary: [employer] Prolia co-insurance: 20% Admin fee co-insurance: 0%  Secondary: --- Prolia co-insurance:  Admin fee co-insurance:   Medical Benefit Details: Date Benefits were checked: 10/28/23 Deductible: NO/ Coinsurance: 20%/ Admin Fee: 0%  Prior Auth: N/A PA# Expiration Date:   # of doses approved: ----------------------------------------------------------------------- Option# 2- Med Obtained from pharmacy:  Pharmacy benefit: Copay $0 (Paid to pharmacy) Admin Fee: 0% (Pay at clinic)  Prior Auth: N/A PA# Expiration Date:   # of doses approved:   If patient wants fill through the pharmacy benefit please send prescription to: [employer], and include estimated need by date in rx notes. Pharmacy will ship medication directly to the office.  Patient NOT eligible for Prolia Copay Card. Copay Card can make patient's cost as little as $25. Link to apply: [email or web address]://[email or web address]  ** This summary of benefits is an estimation of the patient's out-of-pocket cost. Exact cost may very based on individual plan coverage.

## 2023-11-04 ENCOUNTER — Ambulatory Visit: Admitting: Gastroenterology

## 2023-11-04 ENCOUNTER — Encounter: Payer: Self-pay | Admitting: Gastroenterology

## 2023-11-04 VITALS — BP 100/60 | HR 51 | Ht 65.0 in | Wt 154.0 lb

## 2023-11-04 DIAGNOSIS — K219 Gastro-esophageal reflux disease without esophagitis: Secondary | ICD-10-CM | POA: Insufficient documentation

## 2023-11-04 DIAGNOSIS — F419 Anxiety disorder, unspecified: Secondary | ICD-10-CM | POA: Diagnosis not present

## 2023-11-04 NOTE — Progress Notes (Signed)
 11/04/2023 [name] [id number] 26-Apr-1933   HISTORY OF PRESENT ILLNESS: This is an 89 year old female is a patient of Dr. Melba.  She has history of gastric ulcers.  Last EGD in 2020 looked good.  She is here today because she reports being under a lot of stress with taking care of her 2 year old husband.  They have been married 66 years.  She is concerned that the stress may cause an ulcer.  She is on pantoprazole  40 mg twice daily.  She had called and spoke with one of our PAs on-call a couple of weeks ago.  It was recommended she added some famotidine .  She feels like that has helped.  She describes getting very anxious and upset, sometimes pain in her chest when this occurs.  All this happens when her husband raises his voice at her.  This happens out of frustration, but she gets the brunt of all of his frustration.  She does have Xanax  which she uses as needed, she thinks daily at bedtime.  She tries to distract herself by playing bridge, going walking, watching tennis, reading, etc.   Past Medical History:  Diagnosis Date   Allergy    Anemia    Anxiety    Aortic valve stenosis, severe    s/p AVR // mild to mod paravalvular leakage // Echo 9/21: EF 55-60, no RWMA, mild LVH, GR 2 DD, mildly reduced RVSF, RVSP 38.7, moderate LAE, mild RAE, AVR with mean gradient 11 mmHg, mild paravalvular leakage   Arthritis    Breast cancer (HCC) 2007   right   Cataract    Constipation    Dysrhythmia    A-fib   Hiatal hernia    pt denies   Hypercholesteremia    Hypertension    Hypoglycemia    Osteopenia    Personal history of radiation therapy    Tingling    Ulcer 2012   bleeding gastric Ulcer   Past Surgical History:  Procedure Laterality Date   AORTIC VALVE REPLACEMENT N/A 11/16/2014   Procedure: AORTIC VALVE REPLACEMENT (AVR);  Surgeon: Dorise MARLA Fellers, MD;  Location: [facility] OR;  Service: Open Heart Surgery;  Laterality: N/A;   BIOPSY  12/15/2017   Procedure: BIOPSY;   Surgeon: Wilhelmenia Aloha Raddle., MD;  Location: [facility] ENDOSCOPY;  Service: Gastroenterology;;   BIOPSY  01/10/2021   Procedure: BIOPSY;  Surgeon: Wilhelmenia Aloha Raddle., MD;  Location: [facility] ENDOSCOPY;  Service: Gastroenterology;;   BREAST BIOPSY     BREAST LUMPECTOMY Right    2005?   BREAST SURGERY     BRONCHIAL BIOPSY  03/06/2020   Procedure: BRONCHIAL BIOPSIES;  Surgeon: Shelah Lamar RAMAN, MD;  Location: [facility] ENDOSCOPY;  Service: Pulmonary;;   BRONCHIAL BRUSHINGS  03/06/2020   Procedure: BRONCHIAL BRUSHINGS;  Surgeon: Shelah Lamar RAMAN, MD;  Location: [facility] ENDOSCOPY;  Service: Pulmonary;;   BRONCHIAL NEEDLE ASPIRATION BIOPSY  03/06/2020   Procedure: BRONCHIAL NEEDLE ASPIRATION BIOPSIES;  Surgeon: Shelah Lamar RAMAN, MD;  Location: [facility] ENDOSCOPY;  Service: Pulmonary;;   BRONCHIAL WASHINGS  03/06/2020   Procedure: BRONCHIAL WASHINGS;  Surgeon: Shelah Lamar RAMAN, MD;  Location: [facility] ENDOSCOPY;  Service: Pulmonary;;   CARDIAC CATHETERIZATION N/A 10/18/2014   Procedure: Right/Left Heart Cath and Coronary Angiography;  Surgeon: Ozell Fell, MD;  Location: [facility] INVASIVE CV LAB;  Service: Cardiovascular;  Laterality: N/A;   CARDIOVERSION N/A 12/09/2019   Procedure: CARDIOVERSION;  Surgeon: Delford Maude BROCKS, MD;  Location: [facility] ENDOSCOPY;  Service: Cardiovascular;  Laterality: N/A;  CARDIOVERSION N/A 12/30/2019   Procedure: CARDIOVERSION;  Surgeon: Delford Maude BROCKS, MD;  Location: [facility] ENDOSCOPY;  Service: Cardiovascular;  Laterality: N/A;   COLONOSCOPY WITH PROPOFOL  N/A 01/10/2021   Procedure: COLONOSCOPY WITH PROPOFOL ;  Surgeon: Wilhelmenia Aloha Raddle., MD;  Location: [facility] ENDOSCOPY;  Service: Gastroenterology;  Laterality: N/A;   CORONARY ANGIOGRAPHY N/A 02/04/2019   Procedure: CORONARY ANGIOGRAPHY;  Surgeon: Mady Bruckner, MD;  Location: [facility] INVASIVE CV LAB;  Service: Cardiovascular;  Laterality: N/A;   COSMETIC SURGERY     face   ESOPHAGOGASTRODUODENOSCOPY (EGD) WITH PROPOFOL  N/A 12/15/2017   Procedure:  ESOPHAGOGASTRODUODENOSCOPY (EGD) WITH PROPOFOL ;  Surgeon: Wilhelmenia Aloha Raddle., MD;  Location: [facility] ENDOSCOPY;  Service: Gastroenterology;  Laterality: N/A;   FIDUCIAL MARKER PLACEMENT  03/06/2020   Procedure: FIDUCIAL MARKER PLACEMENT;  Surgeon: Shelah Lamar RAMAN, MD;  Location: [facility] ENDOSCOPY;  Service: Pulmonary;;   PARATHYROIDECTOMY     TEE WITHOUT CARDIOVERSION N/A 11/16/2014   Procedure: TRANSESOPHAGEAL ECHOCARDIOGRAM (TEE);  Surgeon: Dorise MARLA Fellers, MD;  Location: [facility] OR;  Service: Open Heart Surgery;  Laterality: N/A;   TEE WITHOUT CARDIOVERSION N/A 12/09/2019   Procedure: TRANSESOPHAGEAL ECHOCARDIOGRAM (TEE);  Surgeon: Delford Maude BROCKS, MD;  Location: [facility] ENDOSCOPY;  Service: Cardiovascular;  Laterality: N/A;   TONSILLECTOMY     VIDEO BRONCHOSCOPY WITH ENDOBRONCHIAL NAVIGATION Right 03/06/2020   Procedure: VIDEO BRONCHOSCOPY WITH ENDOBRONCHIAL NAVIGATION;  Surgeon: Shelah Lamar RAMAN, MD;  Location: [facility] ENDOSCOPY;  Service: Pulmonary;  Laterality: Right;    reports that she quit smoking about 39 years ago. Her smoking use included cigarettes. She started smoking about 54 years ago. She has a 7.5 pack-year smoking history. She has never used smokeless tobacco. She reports current alcohol use of about 7.0 standard drinks of alcohol per week. She reports that she does not use drugs. family history includes Cancer (age of onset: 1) in her cousin; Colon cancer in her mother; Hypertension in her mother; Kidney disease in her father; Rectal cancer in her mother. Allergies  Allergen Reactions   Covid-19 (Mrna) Vaccine     fever   Lactose Intolerance (Gi) Diarrhea and Nausea Only    GI upset   Amoxicillin-Pot Clavulanate Hives and Rash   Amoxicillin-Pot Clavulanate Rash   Penicillin G Benzathine Rash   Penicillins Hives and Rash    30 years   Sulfa Antibiotics Hives and Rash   Sulfonamide Derivatives Hives and Rash      Outpatient Encounter Medications as of 11/04/2023  Medication Sig    acetaminophen  (TYLENOL ) 500 MG tablet Take 500-1,000 mg by mouth every 6 (six) hours as needed for mild pain or moderate pain.    alendronate  (FOSAMAX ) 70 MG tablet Take 1 tablet (70 mg total) by mouth every 7 (seven) days. Take with a full glass of water  on an empty stomach.   ALPRAZolam  (XANAX ) 0.5 MG tablet TAKE 1 TABLET TWICE DAILY AS NEEDED FOR AN ANXIETY, DO NOT TAKE AT NIGHT.   apixaban  (ELIQUIS ) 5 MG TABS tablet Take 1 tablet (5 mg total) by mouth 2 (two) times daily.   Calcium  Carb-Cholecalciferol 600-400 MG-UNIT TABS Take 1 tablet by mouth daily.   Chloral Hydrate CRYS Take 10 mls by mouth at bedtime as needed   Cholecalciferol (VITAMIN D -3) 1000 UNITS CAPS Take 1,000 Units by mouth daily.    clindamycin  (CLEOCIN ) 300 MG capsule Per patient taking prior to dental appointment   dapagliflozin  propanediol (FARXIGA ) 10 MG TABS tablet Take 1 tablet (10 mg total) by mouth every other day.  ferrous sulfate 325 (65 FE) MG tablet Take 325 mg by mouth 2 (two) times a week. Every Monday and Wednesday   furosemide  (LASIX ) 20 MG tablet Take 1 tablet by mouth daily. If greater than 3 pound gain in 24 hours, increase to 2 tablets daily until weight returns to normal   ipratropium (ATROVENT ) 0.06 % nasal spray 2 sprays each nostril every 6 hours to dry nose   levocetirizine (XYZAL ) 5 MG tablet Take 1 tablet (5 mg total) by mouth daily as needed (Can take an extra dose during flare ups).   Magnesium  Oxide 250 MG TABS Take by mouth.   metoprolol  succinate (TOPROL -XL) 25 MG 24 hr tablet Take 1 tablet (25 mg total) by mouth daily.   nitroGLYCERIN  (NITROSTAT ) 0.4 MG SL tablet Dissolve 1 tablet under the tongue every 5 minutes as needed for chest pain. Max of 3 doses, then 911.   pantoprazole  (PROTONIX ) 40 MG tablet TAKE ONE TABLET BY MOUTH TWICE DAILY   Polyethyl Glycol-Propyl Glycol (SYSTANE ULTRA) 0.4-0.3 % SOLN Place 1 drop into both eyes daily as needed (dry eyes).   rosuvastatin  (CRESTOR ) 20 MG tablet  Take 1 tablet (20 mg total) by mouth daily.   tretinoin (RETIN-A) 0.05 % cream Apply topically.   [DISCONTINUED] nitroGLYCERIN  (NITROSTAT ) 0.4 MG SL tablet Place 1 tablet (0.4 mg total) under the tongue every 5 (five) minutes as needed for chest pain (CP or SOB).   Facility-Administered Encounter Medications as of 11/04/2023  Medication   [START ON 11/11/2023] denosumab  (PROLIA ) injection 60 mg    REVIEW OF SYSTEMS  : All other systems reviewed and negative except where noted in the History of Present Illness.   PHYSICAL EXAM: BP 100/60   Pulse (!) 51   Ht 5' 5 (1.651 m)   Wt 154 lb (69.9 kg)   BMI 25.63 kg/m  General: Well developed white female in no acute distress Head: Normocephalic and atraumatic Eyes:  Sclerae anicteric, conjunctiva pink. Ears: Normal auditory acuity Lungs: Clear throughout to auscultation; no W/R/R. Heart:  Irregularly irregular. Musculoskeletal: Symmetrical with no gross deformities  Skin: No lesions on visible extremities Extremities: No edema  Neurological: Alert oriented x 4, grossly non-focal Psychological:  Alert and cooperative. Normal mood and affect  ASSESSMENT AND PLAN: *GERD:  On pantoprazole  40 mg BID.  Was recently recommended that she can take famotidine  20 mg couple times daily if needed as well. *Anxiety: Sounds like this is her major issue.  Having a lot of anxiety and mental difficulty with caring for her 60 year old husband.  She has Xanax  at home, which she only usually takes once daily she thinks at bedtime to help her sleep.  -Patient concerned about developing an ulcer from the stress and anxiety of taking care of her husband.  She does not use NSAIDs.  Negative for H. pylori on gastric biopsies in 2020.  She is on pantoprazole  40 mg twice daily and recently added in famotidine .  Can use the famotidine  up to twice daily as well.  She was informed that these medications are gastroprotective.  I think the majority of her symptoms are  related to anxiety.  Suggested that she discuss with her PCP in regards to a possible better regimen to help control her anxiety rather than using Xanax .  Question if Celexa or Lexapro, etc. would be better option for her.   CC:  Geofm Glade PARAS, MD

## 2023-11-04 NOTE — Patient Instructions (Addendum)
 May use Pepcid  20 mg twice daily as needed.   Make appointment with Dr. Geofm to discuss new medications.   _______________________________________________________  If your blood pressure at your visit was 140/90 or greater, please contact your primary care physician to follow up on this.  _______________________________________________________  If you are age 88 or older, your body mass index should be between 23-30. Your Body mass index is 25.63 kg/m. If this is out of the aforementioned range listed, please consider follow up with your Primary Care Provider.  If you are age 24 or younger, your body mass index should be between 19-25. Your Body mass index is 25.63 kg/m. If this is out of the aformentioned range listed, please consider follow up with your Primary Care Provider.   ________________________________________________________  The [facility] providers would like to encourage you to use MYCHART to communicate with providers for non-urgent requests or questions.  Due to long hold times on the telephone, sending your provider a message by [facility] may be a faster and more efficient way to get a response.  Please allow 48 business hours for a response.  Please remember that this is for non-urgent requests.  _______________________________________________________

## 2023-11-05 NOTE — Progress Notes (Signed)
 Attending Physician's Attestation   I have reviewed the chart.   I agree with the Advanced Practitioner's note, impression, and recommendations with any updates as below. Very reasonable plan.  She has had ulcers found in significant social stressors as well in the past.  If she continues to have symptoms, we can consider repeat diagnostic EGD in future.  Thank you for seeing her in follow-up.   Aloha Finner, MD [facility] Advanced Endoscopy Office # [phone]

## 2023-11-16 ENCOUNTER — Other Ambulatory Visit: Payer: Self-pay | Admitting: *Deleted

## 2023-11-16 ENCOUNTER — Other Ambulatory Visit: Payer: Self-pay | Admitting: Allergy and Immunology

## 2023-11-16 ENCOUNTER — Other Ambulatory Visit: Payer: Self-pay | Admitting: Gastroenterology

## 2023-11-17 ENCOUNTER — Telehealth: Payer: Self-pay | Admitting: Internal Medicine

## 2023-11-17 DIAGNOSIS — M810 Age-related osteoporosis without current pathological fracture: Secondary | ICD-10-CM

## 2023-11-17 NOTE — Telephone Encounter (Signed)
 Pt was requesting a bone density lvm to schedule.  Just a FYI

## 2023-11-18 NOTE — Telephone Encounter (Signed)
 Bone density reordered for the [facility].

## 2023-11-18 NOTE — Telephone Encounter (Signed)
 Copied from CRM #[id number]. Topic: Appointments - Scheduling Inquiry for Clinic >> Nov 17, 2023  5:17 PM [name] wrote: Reason for CRM: Patient returning a call to schedule a bone density test. She would like if the test could be scheduled at [facility]. Patient did state that she does not want to be called before 9 AM.  ---  Can pt have scan done at [facility]?  TDW

## 2023-11-30 ENCOUNTER — Encounter: Payer: Self-pay | Admitting: Internal Medicine

## 2023-12-08 ENCOUNTER — Ambulatory Visit (INDEPENDENT_AMBULATORY_CARE_PROVIDER_SITE_OTHER)
Admission: RE | Admit: 2023-12-08 | Discharge: 2023-12-08 | Disposition: A | Discharge status: Home / Self Care | Source: Ambulatory Visit | Attending: Internal Medicine | Admitting: Internal Medicine

## 2023-12-08 DIAGNOSIS — M810 Age-related osteoporosis without current pathological fracture: Secondary | ICD-10-CM

## 2023-12-10 ENCOUNTER — Ambulatory Visit: Payer: Self-pay | Admitting: Internal Medicine

## 2023-12-10 DIAGNOSIS — M810 Age-related osteoporosis without current pathological fracture: Secondary | ICD-10-CM

## 2023-12-14 ENCOUNTER — Other Ambulatory Visit: Payer: Self-pay | Admitting: Allergy and Immunology

## 2023-12-18 ENCOUNTER — Other Ambulatory Visit: Payer: Self-pay | Admitting: *Deleted

## 2023-12-18 ENCOUNTER — Ambulatory Visit: Admitting: Gastroenterology

## 2023-12-20 MED ORDER — CHLORAL HYDRATE CRYS: CRYSTALS | 4 refills | Status: AC

## 2023-12-20 NOTE — Addendum Note (Signed)
 Addended by: GEOFM GLADE PARAS on: 12/20/2023 02:13 PM   Modules accepted: Orders

## 2023-12-22 ENCOUNTER — Ambulatory Visit (INDEPENDENT_AMBULATORY_CARE_PROVIDER_SITE_OTHER): Admitting: Allergy and Immunology

## 2023-12-22 VITALS — BP 112/78 | HR 76 | Ht 65.0 in | Wt 156.0 lb

## 2023-12-22 DIAGNOSIS — J3089 Other allergic rhinitis: Secondary | ICD-10-CM | POA: Diagnosis not present

## 2023-12-22 DIAGNOSIS — J310 Chronic rhinitis: Secondary | ICD-10-CM

## 2023-12-22 MED ORDER — RYALTRIS 665-25 MCG/ACT NA SUSP: 2.0000 | Freq: Every morning | NASAL | 3 refills | Status: AC

## 2023-12-22 MED ORDER — CARBINOXAMINE MALEATE 4 MG PO TABS: ORAL_TABLET | ORAL | 1 refills | Status: AC

## 2023-12-22 NOTE — Patient Instructions (Addendum)
  1. Continue Ryaltris  - 2 sprays each nostril 1-2 times per day (specialty pharmacy)  2. IF NEEDED:   A. Ipratropium 0.06% - 2 sprays each nostril every 6 hours to dry nose  B. Carbinoxamine  4 mg - 1/2-1 tab every 8-12 hours (2-3 times per day)  3. Will carbinoxamine  be too drying???  4. Return to clinic in 1 year  5. Plan for fall flu vaccine  6. Influenza = Tamiflu. Covid = molnupiravir

## 2023-12-22 NOTE — Progress Notes (Unsigned)
 [place] - [place] - [place] - [place] - [place]   Follow-up Note  Referring Provider: Geofm Glade PARAS, MD Primary Provider: Geofm Glade PARAS, MD Date of Office Visit: 12/22/2023  Subjective:   [name] (DOB: Mar 19, 1933) is a 89 y.o. female [facility] in re-evaluation of the following:  HPI: Verna returns to this clinic in evaluation of allergic and nonallergic rhinitis.  I last saw her in this clinic 21 October 2022.  She does not believe that she is receiving the benefit she so desires from her current plan which includes right Ultracin nasal ipratropium.  Although she does not really have much nasal congestion she always has nasal drip from both nostrils.  When she uses her nasal ipratropium and may help for an hour or so.  She would like to take a pill to clear up this issue.  Allergies as of 12/22/2023       Reactions   Covid-19 (mrna) Vaccine    fever   Lactose Intolerance (gi) Diarrhea, Nausea Only   GI upset   Amoxicillin-pot Clavulanate Hives, Rash   Amoxicillin-pot Clavulanate Rash   Penicillin G Benzathine Rash   Penicillins Hives, Rash   30 years   Sulfa Antibiotics Hives, Rash   Sulfonamide Derivatives Hives, Rash        Medication List    acetaminophen  500 MG tablet Commonly known as: TYLENOL  Take 500-1,000 mg by mouth every 6 (six) hours as needed for mild pain or moderate pain.   alendronate  70 MG tablet Commonly known as: FOSAMAX  Take 1 tablet (70 mg total) by mouth every 7 (seven) days. Take with a full glass of water  on an empty stomach.   ALPRAZolam  0.5 MG tablet Commonly known as: XANAX  TAKE 1 TABLET TWICE DAILY AS NEEDED FOR AN ANXIETY, DO NOT TAKE AT NIGHT.   apixaban  5 MG Tabs tablet Commonly known as: ELIQUIS  Take 1 tablet (5 mg total) by mouth 2 (two) times daily.   Calcium  Carb-Cholecalciferol 600-400 MG-UNIT Tabs Take 1 tablet by mouth daily.   Carbinoxamine  Maleate  4 MG Tabs 1/2-1 tab every 8-12 hours (2-3 times per day)   Chloral Hydrate Crys Take 10 mls by mouth at bedtime as needed   clindamycin  300 MG capsule Commonly known as: CLEOCIN  Per patient taking prior to dental appointment   dapagliflozin  propanediol 10 MG Tabs tablet Commonly known as: FARXIGA  Take 1 tablet (10 mg total) by mouth every other day.   ferrous sulfate 325 (65 FE) MG tablet Take 325 mg by mouth 2 (two) times a week. Every Monday and Wednesday   furosemide  20 MG tablet Commonly known as: LASIX  Take 1 tablet by mouth daily. If greater than 3 pound gain in 24 hours, increase to 2 tablets daily until weight returns to normal   ipratropium 0.06 % nasal spray Commonly known as: ATROVENT  USE TWO SPRAYS IN EACH NOSTRIL EVERY 6 HOURS TO DRY NOSE.   levocetirizine 5 MG tablet Commonly known as: XYZAL  Take 1 tablet (5 mg total) by mouth daily as needed (Can take an extra dose during flare ups).   Magnesium  Oxide 250 MG Tabs Take by mouth.   metoprolol  succinate 25 MG 24 hr tablet Commonly known as: TOPROL -XL Take 1 tablet (25 mg total) by mouth daily.   nitroGLYCERIN  0.4 MG SL tablet Commonly known as: NITROSTAT  Dissolve 1 tablet under the tongue every 5 minutes as needed for chest pain. Max of 3  doses, then 911.   pantoprazole  40 MG tablet Commonly known as: PROTONIX  TAKE ONE TABLET BY MOUTH TWICE DAILY   rosuvastatin  20 MG tablet Commonly known as: CRESTOR  Take 1 tablet (20 mg total) by mouth daily.   Ryaltris  665-25 MCG/ACT Susp Generic drug: Olopatadine-Mometasone  Place 2 sprays into the nose daily.   Systane Ultra 0.4-0.3 % Soln Generic drug: Polyethyl Glycol-Propyl Glycol Place 1 drop into both eyes daily as needed (dry eyes).   tretinoin 0.05 % cream Commonly known as: RETIN-A Apply topically.   Vitamin D -3 25 MCG (1000 UT) Caps Take 1,000 Units by mouth daily.    Past Medical History:  Diagnosis Date   Allergy    Anemia    Anxiety     Aortic valve stenosis, severe    s/p AVR // mild to mod paravalvular leakage // Echo 9/21: EF 55-60, no RWMA, mild LVH, GR 2 DD, mildly reduced RVSF, RVSP 38.7, moderate LAE, mild RAE, AVR with mean gradient 11 mmHg, mild paravalvular leakage   Arthritis    Breast cancer (HCC) 2007   right   Cataract    Constipation    Dysrhythmia    A-fib   Hiatal hernia    pt denies   Hypercholesteremia    Hypertension    Hypoglycemia    Osteopenia    Personal history of radiation therapy    Tingling    Ulcer 2012   bleeding gastric Ulcer    Past Surgical History:  Procedure Laterality Date   AORTIC VALVE REPLACEMENT N/A 11/16/2014   Procedure: AORTIC VALVE REPLACEMENT (AVR);  Surgeon: Dorise MARLA Fellers, MD;  Location: [facility] OR;  Service: Open Heart Surgery;  Laterality: N/A;   BIOPSY  12/15/2017   Procedure: BIOPSY;  Surgeon: Wilhelmenia Aloha Raddle., MD;  Location: [facility] ENDOSCOPY;  Service: Gastroenterology;;   BIOPSY  01/10/2021   Procedure: BIOPSY;  Surgeon: Wilhelmenia Aloha Raddle., MD;  Location: [facility] ENDOSCOPY;  Service: Gastroenterology;;   BREAST BIOPSY     BREAST LUMPECTOMY Right    2005?   BREAST SURGERY     BRONCHIAL BIOPSY  03/06/2020   Procedure: BRONCHIAL BIOPSIES;  Surgeon: Shelah Lamar RAMAN, MD;  Location: [facility] ENDOSCOPY;  Service: Pulmonary;;   BRONCHIAL BRUSHINGS  03/06/2020   Procedure: BRONCHIAL BRUSHINGS;  Surgeon: Shelah Lamar RAMAN, MD;  Location: [facility] ENDOSCOPY;  Service: Pulmonary;;   BRONCHIAL NEEDLE ASPIRATION BIOPSY  03/06/2020   Procedure: BRONCHIAL NEEDLE ASPIRATION BIOPSIES;  Surgeon: Shelah Lamar RAMAN, MD;  Location: [facility] ENDOSCOPY;  Service: Pulmonary;;   BRONCHIAL WASHINGS  03/06/2020   Procedure: BRONCHIAL WASHINGS;  Surgeon: Shelah Lamar RAMAN, MD;  Location: [facility] ENDOSCOPY;  Service: Pulmonary;;   CARDIAC CATHETERIZATION N/A 10/18/2014   Procedure: Right/Left Heart Cath and Coronary Angiography;  Surgeon: Ozell Fell, MD;  Location: [facility] INVASIVE CV LAB;  Service: Cardiovascular;   Laterality: N/A;   CARDIOVERSION N/A 12/09/2019   Procedure: CARDIOVERSION;  Surgeon: Delford Maude BROCKS, MD;  Location: [facility] ENDOSCOPY;  Service: Cardiovascular;  Laterality: N/A;   CARDIOVERSION N/A 12/30/2019   Procedure: CARDIOVERSION;  Surgeon: Delford Maude BROCKS, MD;  Location: [facility] ENDOSCOPY;  Service: Cardiovascular;  Laterality: N/A;   COLONOSCOPY WITH PROPOFOL  N/A 01/10/2021   Procedure: COLONOSCOPY WITH PROPOFOL ;  Surgeon: Wilhelmenia Aloha Raddle., MD;  Location: [facility] ENDOSCOPY;  Service: Gastroenterology;  Laterality: N/A;   CORONARY ANGIOGRAPHY N/A 02/04/2019   Procedure: CORONARY ANGIOGRAPHY;  Surgeon: Mady Bruckner, MD;  Location: [facility] INVASIVE CV LAB;  Service: Cardiovascular;  Laterality: N/A;   COSMETIC SURGERY  face   ESOPHAGOGASTRODUODENOSCOPY (EGD) WITH PROPOFOL  N/A 12/15/2017   Procedure: ESOPHAGOGASTRODUODENOSCOPY (EGD) WITH PROPOFOL ;  Surgeon: Wilhelmenia Aloha Raddle., MD;  Location: [facility] ENDOSCOPY;  Service: Gastroenterology;  Laterality: N/A;   FIDUCIAL MARKER PLACEMENT  03/06/2020   Procedure: FIDUCIAL MARKER PLACEMENT;  Surgeon: Shelah Lamar RAMAN, MD;  Location: [facility] ENDOSCOPY;  Service: Pulmonary;;   PARATHYROIDECTOMY     TEE WITHOUT CARDIOVERSION N/A 11/16/2014   Procedure: TRANSESOPHAGEAL ECHOCARDIOGRAM (TEE);  Surgeon: Dorise MARLA Fellers, MD;  Location: [facility] OR;  Service: Open Heart Surgery;  Laterality: N/A;   TEE WITHOUT CARDIOVERSION N/A 12/09/2019   Procedure: TRANSESOPHAGEAL ECHOCARDIOGRAM (TEE);  Surgeon: Delford Maude BROCKS, MD;  Location: [facility] ENDOSCOPY;  Service: Cardiovascular;  Laterality: N/A;   TONSILLECTOMY     VIDEO BRONCHOSCOPY WITH ENDOBRONCHIAL NAVIGATION Right 03/06/2020   Procedure: VIDEO BRONCHOSCOPY WITH ENDOBRONCHIAL NAVIGATION;  Surgeon: Shelah Lamar RAMAN, MD;  Location: [facility] ENDOSCOPY;  Service: Pulmonary;  Laterality: Right;    Review of systems negative except as noted in HPI / PMHx or noted below:  Review of Systems  Constitutional: Negative.   HENT: Negative.    Eyes:  Negative.   Respiratory: Negative.    Cardiovascular: Negative.   Gastrointestinal: Negative.   Genitourinary: Negative.   Musculoskeletal: Negative.   Skin: Negative.   Neurological: Negative.   Endo/Heme/Allergies: Negative.   Psychiatric/Behavioral: Negative.       Objective:   Vitals:   12/22/23 1146  BP: 112/78  Pulse: 76  SpO2: 95%   Height: 5' 5 (165.1 cm)  Weight: 156 lb (70.8 kg)   Physical Exam HENT:     Nose: No mucosal edema.     Diagnostics: none  Assessment and Plan:   1. Perennial allergic rhinitis   2. Nonallergic rhinitis    1. Continue Ryaltris  - 2 sprays each nostril 1-2 times per day (specialty pharmacy)  2. IF NEEDED:   A. Ipratropium 0.06% - 2 sprays each nostril every 6 hours to dry nose  B. Carbinoxamine  4 mg - 1/2-1 tab every 8-12 hours (2-3 times per day)  3. Will carbinoxamine  be too drying???  4. Return to clinic in 1 year  5. Plan for fall flu vaccine  6. Influenza = Tamiflu. Covid = molnupiravir  Ariyonna can try carbinoxamine  and I had a discussion with her today about some of the side effects of using this agent including drying of her eyes and drying of her mouth.  She already has dry eye and she may not be able to tolerate carbinoxamine  in an attempt to dry up her nasal rhinorrhea.  She can start with a very low dose of 2 mg and keep us  informed about her response to this approach.  Camellia Denis, MD Allergy / Immunology [facility]

## 2023-12-23 ENCOUNTER — Encounter: Payer: Self-pay | Admitting: Allergy and Immunology

## 2023-12-29 ENCOUNTER — Other Ambulatory Visit: Payer: Self-pay | Admitting: Internal Medicine

## 2023-12-29 ENCOUNTER — Ambulatory Visit (HOSPITAL_BASED_OUTPATIENT_CLINIC_OR_DEPARTMENT_OTHER)

## 2023-12-29 ENCOUNTER — Ambulatory Visit (HOSPITAL_BASED_OUTPATIENT_CLINIC_OR_DEPARTMENT_OTHER)
Admission: RE | Admit: 2023-12-29 | Discharge: 2023-12-29 | Disposition: A | Discharge status: Home / Self Care | Source: Ambulatory Visit | Attending: Radiation Oncology | Admitting: Radiation Oncology

## 2023-12-29 DIAGNOSIS — C3490 Malignant neoplasm of unspecified part of unspecified bronchus or lung: Secondary | ICD-10-CM | POA: Diagnosis not present

## 2023-12-29 DIAGNOSIS — I700 Atherosclerosis of aorta: Secondary | ICD-10-CM | POA: Diagnosis not present

## 2023-12-29 DIAGNOSIS — J90 Pleural effusion, not elsewhere classified: Secondary | ICD-10-CM | POA: Diagnosis not present

## 2023-12-29 DIAGNOSIS — C3411 Malignant neoplasm of upper lobe, right bronchus or lung: Secondary | ICD-10-CM | POA: Diagnosis not present

## 2023-12-29 DIAGNOSIS — J432 Centrilobular emphysema: Secondary | ICD-10-CM | POA: Diagnosis not present

## 2023-12-30 ENCOUNTER — Other Ambulatory Visit: Payer: Self-pay | Admitting: Internal Medicine

## 2024-01-04 ENCOUNTER — Ambulatory Visit
Admission: RE | Admit: 2024-01-04 | Discharge: 2024-01-04 | Disposition: A | Discharge status: Home / Self Care | Source: Ambulatory Visit | Attending: Radiation Oncology | Admitting: Radiation Oncology

## 2024-01-04 ENCOUNTER — Encounter: Payer: Self-pay | Admitting: Radiation Oncology

## 2024-01-04 DIAGNOSIS — C3411 Malignant neoplasm of upper lobe, right bronchus or lung: Secondary | ICD-10-CM

## 2024-01-04 DIAGNOSIS — Z87891 Personal history of nicotine dependence: Secondary | ICD-10-CM | POA: Diagnosis not present

## 2024-01-04 NOTE — Progress Notes (Signed)
 [name] telephone appointment completed. Identity verified. Meaningful use complete.   Lung Side: Right, patient completed treatment on 04/26/20.  Does the patient complain of any of the following: Pain:No Shortness of breath w/wo exertion: Yes mostly on exertion.  Cough: No Hemoptysis: No Pain with swallowing: No Swallowing/choking concerns: No Appetite: Good  Energy Level: Fair  Post radiation skin Change: No    Additional comments if applicable:

## 2024-01-04 NOTE — Progress Notes (Signed)
 Radiation Oncology         ([phone]) [phone] ________________________________  Outpatient Follow Up- Conducted via telephone at patient request.  I spoke with the patient to conduct this visit via telephone. The patient was notified in advance and was offered an in person or telemedicine meeting to allow for face to face communication but instead preferred to proceed with a telephone visit.   Name: [name]        MRN: [id number]  Date of Service: 01/04/2024 DOB: [birth year]  RR:Almwd, Glade PARAS, MD  Geofm Glade PARAS, MD     REFERRING PHYSICIAN: Geofm Glade PARAS, MD   DIAGNOSIS: The encounter diagnosis was Malignant neoplasm of upper lobe of right lung [facility]).   HISTORY OF PRESENT ILLNESS: [name] is a 89 y.o. female with a history of Stage IA2, cT1bN0M0, NSCLC, adenocarcinoma of the RUL.  The patient has a history of right breast cancer treated in [place], [place]  with lumpectomy and adjuvant radiotherapy in 2006. She also has a history of valvular disease and underwent an aortic valve replacement in July 2016 by Dr. Guss, as well as atrial fibrillation and diastolic CHF. Pulmonary medicine followed along with her after identifying a RUL pulmonary nodule originally identified in May 2018, the solid component became more prominent in 2021 and a bronchoscopy was performed on 03/06/2020. Biopsies and cytology of theRUL from fine-needle aspiration and brushing were consistent with malignant cells morphologically consistent with an adenocarcinoma.  Atypical cells were also seen in the right upper lobe lavage. She proceeded with stereotactic body radiotherapy (SBRT) in 3 fractions which she completed to the RUL target in December 2021.   She's been followed in surveillance since. Interestingly her surveilance scans have identified pneumatosis of the colon. She underwent work up with additional imaging and met with Dr. Wilhelmenia regarding these findings. A Cologuard was positive but at the  time of colonoscopy in September 2022 she had focal changes of the bowel wall consistent with her pneumatosis and a transverse polyp that was consistent with tubular adenoma. Dr. Wilhelmenia did not recommend repeat endoscopy.   While she has had some inflammatory findings at or after infections in the lungs, she has been stable from an oncologic perspective without recurrences. She does have chronic  pneumatosis in the splenic flexure of the colon. Her most recent scan on 12/29/23 showed no evidence of recurrence. She has had post radiation changes and scattered tiny nodules that are stable and felt to be benign, as is a 1.6 cm stable hilar node. She has a trace right pleural fluid, chronic evidence of pneumatosis in the splenic flexure of the colon, atherosclerosis, enlargement of her pulmonary trunk and emphysema.  She is contacted by phone to review these results.    PREVIOUS RADIATION THERAPY:    04/18/20-04/26/20 SBRT Treatment: The RUL target was treated to 54 Gy in 3 fractions  About 2006: The patient received adjuvant right breast radiotherapy. Details are not known at this point about her dosing.   PAST MEDICAL HISTORY:  Past Medical History:  Diagnosis Date   Allergy    Anemia    Anxiety    Aortic valve stenosis, severe    s/p AVR // mild to mod paravalvular leakage // Echo 9/21: EF 55-60, no RWMA, mild LVH, GR 2 DD, mildly reduced RVSF, RVSP 38.7, moderate LAE, mild RAE, AVR with mean gradient 11 mmHg, mild paravalvular leakage   Arthritis    Breast cancer (HCC) 2007   right  Cataract    Constipation    Dysrhythmia    A-fib   Hiatal hernia    pt denies   Hypercholesteremia    Hypertension    Hypoglycemia    Osteopenia    Personal history of radiation therapy    Tingling    Ulcer 2012   bleeding gastric Ulcer       PAST SURGICAL HISTORY: Past Surgical History:  Procedure Laterality Date   AORTIC VALVE REPLACEMENT N/A 11/16/2014   Procedure: AORTIC VALVE  REPLACEMENT (AVR);  Surgeon: Dorise MARLA Fellers, MD;  Location: [facility] OR;  Service: Open Heart Surgery;  Laterality: N/A;   BIOPSY  12/15/2017   Procedure: BIOPSY;  Surgeon: Wilhelmenia Aloha Raddle., MD;  Location: [facility] ENDOSCOPY;  Service: Gastroenterology;;   BIOPSY  01/10/2021   Procedure: BIOPSY;  Surgeon: Wilhelmenia Aloha Raddle., MD;  Location: [facility] ENDOSCOPY;  Service: Gastroenterology;;   BREAST BIOPSY     BREAST LUMPECTOMY Right    2005?   BREAST SURGERY     BRONCHIAL BIOPSY  03/06/2020   Procedure: BRONCHIAL BIOPSIES;  Surgeon: Shelah Lamar RAMAN, MD;  Location: [facility] ENDOSCOPY;  Service: Pulmonary;;   BRONCHIAL BRUSHINGS  03/06/2020   Procedure: BRONCHIAL BRUSHINGS;  Surgeon: Shelah Lamar RAMAN, MD;  Location: [facility] ENDOSCOPY;  Service: Pulmonary;;   BRONCHIAL NEEDLE ASPIRATION BIOPSY  03/06/2020   Procedure: BRONCHIAL NEEDLE ASPIRATION BIOPSIES;  Surgeon: Shelah Lamar RAMAN, MD;  Location: [facility] ENDOSCOPY;  Service: Pulmonary;;   BRONCHIAL WASHINGS  03/06/2020   Procedure: BRONCHIAL WASHINGS;  Surgeon: Shelah Lamar RAMAN, MD;  Location: [facility] ENDOSCOPY;  Service: Pulmonary;;   CARDIAC CATHETERIZATION N/A 10/18/2014   Procedure: Right/Left Heart Cath and Coronary Angiography;  Surgeon: Ozell Fell, MD;  Location: [facility] INVASIVE CV LAB;  Service: Cardiovascular;  Laterality: N/A;   CARDIOVERSION N/A 12/09/2019   Procedure: CARDIOVERSION;  Surgeon: Delford Maude BROCKS, MD;  Location: [facility] ENDOSCOPY;  Service: Cardiovascular;  Laterality: N/A;   CARDIOVERSION N/A 12/30/2019   Procedure: CARDIOVERSION;  Surgeon: Delford Maude BROCKS, MD;  Location: [facility] ENDOSCOPY;  Service: Cardiovascular;  Laterality: N/A;   COLONOSCOPY WITH PROPOFOL  N/A 01/10/2021   Procedure: COLONOSCOPY WITH PROPOFOL ;  Surgeon: Wilhelmenia Aloha Raddle., MD;  Location: [facility] ENDOSCOPY;  Service: Gastroenterology;  Laterality: N/A;   CORONARY ANGIOGRAPHY N/A 02/04/2019   Procedure: CORONARY ANGIOGRAPHY;  Surgeon: Mady Bruckner, MD;  Location: [facility] INVASIVE CV LAB;  Service:  Cardiovascular;  Laterality: N/A;   COSMETIC SURGERY     face   ESOPHAGOGASTRODUODENOSCOPY (EGD) WITH PROPOFOL  N/A 12/15/2017   Procedure: ESOPHAGOGASTRODUODENOSCOPY (EGD) WITH PROPOFOL ;  Surgeon: Wilhelmenia Aloha Raddle., MD;  Location: [facility] ENDOSCOPY;  Service: Gastroenterology;  Laterality: N/A;   FIDUCIAL MARKER PLACEMENT  03/06/2020   Procedure: FIDUCIAL MARKER PLACEMENT;  Surgeon: Shelah Lamar RAMAN, MD;  Location: [facility] ENDOSCOPY;  Service: Pulmonary;;   PARATHYROIDECTOMY     TEE WITHOUT CARDIOVERSION N/A 11/16/2014   Procedure: TRANSESOPHAGEAL ECHOCARDIOGRAM (TEE);  Surgeon: Dorise MARLA Fellers, MD;  Location: [facility] OR;  Service: Open Heart Surgery;  Laterality: N/A;   TEE WITHOUT CARDIOVERSION N/A 12/09/2019   Procedure: TRANSESOPHAGEAL ECHOCARDIOGRAM (TEE);  Surgeon: Delford Maude BROCKS, MD;  Location: [facility] ENDOSCOPY;  Service: Cardiovascular;  Laterality: N/A;   TONSILLECTOMY     VIDEO BRONCHOSCOPY WITH ENDOBRONCHIAL NAVIGATION Right 03/06/2020   Procedure: VIDEO BRONCHOSCOPY WITH ENDOBRONCHIAL NAVIGATION;  Surgeon: Shelah Lamar RAMAN, MD;  Location: [facility] ENDOSCOPY;  Service: Pulmonary;  Laterality: Right;     FAMILY HISTORY:  Family History  Problem Relation Age of Onset   Hypertension  Mother    Rectal cancer Mother    Colon cancer Mother    Kidney disease Father    Cancer Cousin 17       breast and ovarian    Esophageal cancer Neg Hx    Stomach cancer Neg Hx    Colon polyps Neg Hx      SOCIAL HISTORY:  reports that she quit smoking about 39 years ago. Her smoking use included cigarettes. She started smoking about 54 years ago. She has a 7.5 pack-year smoking history. She has never used smokeless tobacco. She reports current alcohol use of about 7.0 standard drinks of alcohol per week. She reports that she does not use drugs.  The patient is married and lives in [place] in independent living at [facility].  She enjoys playing bridge with friends.    ALLERGIES: Covid-19 (mrna) vaccine, Lactose  intolerance (gi), Amoxicillin-pot clavulanate, Amoxicillin-pot clavulanate, Penicillin g benzathine, Penicillins, Sulfa antibiotics, and Sulfonamide derivatives   MEDICATIONS:  Current Outpatient Medications  Medication Sig Dispense Refill   acetaminophen  (TYLENOL ) 500 MG tablet Take 500-1,000 mg by mouth every 6 (six) hours as needed for mild pain or moderate pain.      alendronate  (FOSAMAX ) 70 MG tablet Take 1 tablet (70 mg total) by mouth every 7 (seven) days. Take with a full glass of water  on an empty stomach. 12 tablet 3   ALPRAZolam  (XANAX ) 0.5 MG tablet TAKE 1 TABLET by mouth TWICE DAILY AS NEEDED FOR ANXIETY, DO NOT TAKE AT NIGHT. 180 tablet 0   apixaban  (ELIQUIS ) 5 MG TABS tablet Take 1 tablet (5 mg total) by mouth 2 (two) times daily. 60 tablet 5   Calcium  Carb-Cholecalciferol 600-400 MG-UNIT TABS Take 1 tablet by mouth daily.     Carbinoxamine  Maleate 4 MG TABS 1/2-1 tab every 8-12 hours (2-3 times per day) 30 tablet 1   Chloral Hydrate CRYS Take 10 mls by mouth at bedtime as needed 900 g 4   Cholecalciferol (VITAMIN D -3) 1000 UNITS CAPS Take 1,000 Units by mouth daily.      clindamycin  (CLEOCIN ) 300 MG capsule Per patient taking prior to dental appointment     dapagliflozin  propanediol (FARXIGA ) 10 MG TABS tablet Take 1 tablet (10 mg total) by mouth every other day. 45 tablet 3   ferrous sulfate 325 (65 FE) MG tablet Take 325 mg by mouth 2 (two) times a week. Every Monday and Wednesday     furosemide  (LASIX ) 20 MG tablet Take 1 tablet by mouth daily. If greater than 3 pound gain in 24 hours, increase to 2 tablets daily until weight returns to normal 120 tablet 3   ipratropium (ATROVENT ) 0.06 % nasal spray USE TWO SPRAYS IN EACH NOSTRIL EVERY 6 HOURS TO DRY NOSE. 45 mL 2   levocetirizine (XYZAL ) 5 MG tablet Take 1 tablet (5 mg total) by mouth daily as needed (Can take an extra dose during flare ups). 180 tablet 1   Magnesium  Oxide 250 MG TABS Take by mouth.     metoprolol  succinate  (TOPROL -XL) 25 MG 24 hr tablet Take 1 tablet (25 mg total) by mouth daily. 90 tablet 3   nitroGLYCERIN  (NITROSTAT ) 0.4 MG SL tablet Dissolve 1 tablet under the tongue every 5 minutes as needed for chest pain. Max of 3 doses, then 911. 25 tablet 3   Olopatadine-Mometasone  (RYALTRIS ) 665-25 MCG/ACT SUSP Place 2 sprays into the nose every morning. 87 g 3   pantoprazole  (PROTONIX ) 40 MG tablet TAKE ONE TABLET BY MOUTH  TWICE DAILY 180 tablet 3   Polyethyl Glycol-Propyl Glycol (SYSTANE ULTRA) 0.4-0.3 % SOLN Place 1 drop into both eyes daily as needed (dry eyes).     rosuvastatin  (CRESTOR ) 20 MG tablet Take 1 tablet (20 mg total) by mouth daily. 90 tablet 3   tretinoin (RETIN-A) 0.05 % cream Apply topically.     Current Facility-Administered Medications  Medication Dose Route Frequency Provider Last Rate Last Admin   denosumab  (PROLIA ) injection 60 mg  60 mg Subcutaneous Once Geofm Glade PARAS, MD         REVIEW OF SYSTEMS: On review of systems, the patient reports she continues ot have shortness of breath with exerting herself. She continues to have chronic cough, nasal drip and chronic afib which have been stable to her. She denies any new trouble with her breathing. She continues to have constipation and uses miralax, prunes, and fiber cereals. No other complaints are verbalized.   PHYSICAL EXAM:  Unable to assess due to encounter type.   ECOG = 1  0 - Asymptomatic (Fully active, able to carry on all predisease activities without restriction)  1 - Symptomatic but completely ambulatory (Restricted in physically strenuous activity but ambulatory and able to carry out work of a light or sedentary nature. For example, light housework, office work)  2 - Symptomatic, <50% in bed during the day (Ambulatory and capable of all self care but unable to carry out any work activities. Up and about more than 50% of waking hours)  3 - Symptomatic, >50% in bed, but not bedbound (Capable of only limited  self-care, confined to bed or chair 50% or more of waking hours)  4 - Bedbound (Completely disabled. Cannot carry on any self-care. Totally confined to bed or chair)  5 - Death   Raylene MM, Creech RH, Tormey DC, et al. [phone]). Toxicity and response criteria of the [facility]. Am. DOROTHA Bridges. Oncol. 5 (6): 649-55    LABORATORY DATA:  Lab Results  Component Value Date   WBC 6.1 10/27/2023   HGB 14.9 10/27/2023   HCT 45.2 10/27/2023   MCV 97.7 10/27/2023   PLT 118.0 (L) 10/27/2023   Lab Results  Component Value Date   NA 140 10/27/2023   K 4.4 10/27/2023   CL 102 10/27/2023   CO2 30 10/27/2023   Lab Results  Component Value Date   ALT 22 10/27/2023   AST 29 10/27/2023   ALKPHOS 68 10/27/2023   BILITOT 0.9 10/27/2023      RADIOGRAPHY: CT Chest Wo Contrast Result Date: 01/01/2024 CLINICAL DATA:  Non-small cell lung cancer, six-month follow-up lung nodule. * Tracking Code: BO * EXAM: CT CHEST WITHOUT CONTRAST TECHNIQUE: Multidetector CT imaging of the chest was performed following the standard protocol without IV contrast. RADIATION DOSE REDUCTION: This exam was performed according to the departmental dose-optimization program which includes automated exposure control, adjustment of the mA and/or kV according to patient size and/or use of iterative reconstruction technique. COMPARISON:  07/09/2023. FINDINGS: Cardiovascular: Atherosclerotic calcification of the aorta. Aortic valve replacement. Enlarged pulmonic trunk and heart. No pericardial effusion. Mediastinum/Nodes: Low right paratracheal lymph node measures 1.6 cm, stable. Hilar regions are difficult to definitively evaluate without IV contrast. No axillary adenopathy. Esophagus is grossly unremarkable. Lungs/Pleura: Centrilobular and paraseptal emphysema. Post radiation consolidation, architectural distortion and volume loss in the right upper lobe, unchanged. Subpleural radiation scarring in the anterior right  lung. Mild chronic basilar septal thickening. Scattered tiny juxtapleural nodules are unchanged and considered  benign. Trace right pleural fluid. Airway is unremarkable. Upper Abdomen: Probable tiny cysts in the liver. No specific follow-up necessary. Chronic pneumatosis in the splenic flexure of the colon. Visualized portions of the liver, adrenal glands, kidneys, spleen, pancreas, stomach and bowel are otherwise grossly unremarkable. No upper abdominal adenopathy. Musculoskeletal: Degenerative changes in the spine. IMPRESSION: 1. No evidence of recurrent or metastatic disease. 2. Trace right pleural fluid. 3. Chronic pneumatosis in the splenic flexure of the colon. 4.  Aortic atherosclerosis (ICD10-I70.0). 5. Enlarged pulmonic trunk, indicative of pulmonary arterial hypertension. 6.  Emphysema (ICD10-J43.9). Electronically Signed   By: Newell Eke M.D.   On: 01/01/2024 11:14   DG Bone Density Result Date: 12/10/2023 Table formatting from the original result was not included. Date of study: 12/07/2021 Exam: DUAL X-RAY ABSORPTIOMETRY (DXA) FOR BONE MINERAL DENSITY (BMD) Instrument: [employer] Lunar Indication: follow up for low BMD Comparison: 2023 Clinical data: Pt is a 89 y.o. female without previous history of fracture. Results:  Lumbar spine L1-L4 Femoral neck (FN) T-score -0.4 RFN: -2.1 LFN: -1.9 Change in BMD from previous DXA test (%) Up 6.5%* Up 4.7%* (*) statistically significant Assessment: By the [facility] Criteria for diagnosis based on bone density, this patient has Low Bone Density FRAX 10-year fracture risk calculator: Was not calculated due to patient being on antiresorptive therapy. Comments: the technical quality of the study is good. WHO criteria for diagnosis of osteoporosis in postmenopausal women and in men 54 y/o or older: - normal: T-score -1.0 to + 1.0 - osteopenia/low bone density: T-score between -2.5 and -1.0 - osteoporosis: T-score below -2.5 - severe osteoporosis: T-score below -2.5  with history of fragility fracture Note: although not part of the WHO classification, the presence of a fragility fracture, regardless of the T-score, should be considered diagnostic of osteoporosis, provided other causes for the fracture have been excluded. Follow up BMD is recommended: 2 years Interpreted by : Stefano Redgie Butts, MD [facility]        IMPRESSION/PLAN: 1. Stage IA2, cT1bN0M0, NSCLC, adenocarcinoma of the RUL. The patient is without evidence of disease. We will plan to follow up with her in 6 months time with a repeat CT scan in 6 months time. She is in agreement with this plan.  2. Right trace pleural effusion and dilated pulmonary trunk. The patient reports she is doing well and at her baseline but with these newer findings radiographicaly I will copy my notes to Dr. Wonda to alert him as well. 3.  Pneumatosis of the splenic flexure. This has been followed expectantly and we would defer any need for management to Dr. Wilhelmenia in GI. She understands the need to proceed with evaluation if she developed acute symptoms of pain, fever, or abrupt GI symptoms.     This encounter was conducted via telephone.  The patient has provided two factor identification and has given verbal consent for this type of encounter and has been advised to only accept a meeting of this type in a secure network environment. The time spent during this encounter was 35 minutes including preparation, discussion, and coordination of the patient's care. The attendants for this meeting include  Donald Estefana Husband  and Rojean L Fujimoto.  During the encounter,  Donald Estefana Husband was located remotely at home. Laure L Waren was located at home.      Donald KYM Husband, PAC

## 2024-01-05 ENCOUNTER — Telehealth: Payer: Self-pay

## 2024-01-05 NOTE — Telephone Encounter (Signed)
*  AA  Pharmacy Patient Advocate Encounter   Received notification from CoverMyMeds that prior authorization for Carbinoxamine  4mg  tablets is required/requested.   Insurance verification completed.   The patient is insured through [facility] ADVANTAGE/RX ADVANCE .   Per test claim: PA required; PA submitted to above mentioned insurance via Latent Key/confirmation #/EOC [id number] Status is pending

## 2024-01-06 NOTE — Telephone Encounter (Signed)
 PT called to ask why Dr Kozlow is giving 10-day Rx for karbinal , asked if she should be seeing a different provider - I advised did not see a 10 day Rx but would get a clinical staff to discuss with her - she asked if it was going to take 5 minutes to get someone like it took 5 minutes to get someone at the front desk, I advised would get someone asap    PT hung up before being able to transfer to [facility] for triage

## 2024-01-07 NOTE — Telephone Encounter (Signed)
 Pharmacy Patient Advocate Encounter  Received notification from [employer]RX [employer] that Prior Authorization for Carbinoxamine  4mg  has been DENIED.  Full denial letter will be uploaded to the media tab. See denial reason below.

## 2024-01-07 NOTE — Telephone Encounter (Signed)
 Spoke with patient and informed her that insurance would not cover cabinoxamine but it could be purchased over the counter for $36.41. Verbalized understanding.

## 2024-01-11 ENCOUNTER — Other Ambulatory Visit: Payer: Self-pay | Admitting: Cardiovascular Disease

## 2024-01-11 DIAGNOSIS — I4821 Permanent atrial fibrillation: Secondary | ICD-10-CM

## 2024-01-11 NOTE — Telephone Encounter (Signed)
 Prescription refill request for Eliquis  received. Indication: AF Last office visit: 09/10/23  CHRISTELLA Fell MD Scr: 0.96 on 10/27/23  Epic Age: 89 Weight: 71.4kg  Based on above findings Eliquis  5mg  twice daily is the appropriate dose.  Refill approved

## 2024-02-05 DIAGNOSIS — H02051 Trichiasis without entropian right upper eyelid: Secondary | ICD-10-CM | POA: Diagnosis not present

## 2024-02-05 DIAGNOSIS — H02054 Trichiasis without entropian left upper eyelid: Secondary | ICD-10-CM | POA: Diagnosis not present

## 2024-02-05 DIAGNOSIS — H0100A Unspecified blepharitis right eye, upper and lower eyelids: Secondary | ICD-10-CM | POA: Diagnosis not present

## 2024-02-05 DIAGNOSIS — H0100B Unspecified blepharitis left eye, upper and lower eyelids: Secondary | ICD-10-CM | POA: Diagnosis not present

## 2024-02-10 ENCOUNTER — Other Ambulatory Visit (HOSPITAL_BASED_OUTPATIENT_CLINIC_OR_DEPARTMENT_OTHER): Payer: Self-pay

## 2024-02-10 MED ORDER — FLUZONE HIGH-DOSE 0.5 ML IM SUSY
0.5000 mL | PREFILLED_SYRINGE | Freq: Once | INTRAMUSCULAR | 0 refills | Status: AC
  Filled 2024-02-10: qty 0.5, 1d supply, fill #0

## 2024-02-12 ENCOUNTER — Encounter: Payer: Self-pay | Admitting: Internal Medicine

## 2024-02-26 DIAGNOSIS — D225 Melanocytic nevi of trunk: Secondary | ICD-10-CM | POA: Diagnosis not present

## 2024-02-26 DIAGNOSIS — L821 Other seborrheic keratosis: Secondary | ICD-10-CM | POA: Diagnosis not present

## 2024-02-26 DIAGNOSIS — D170 Benign lipomatous neoplasm of skin and subcutaneous tissue of head, face and neck: Secondary | ICD-10-CM | POA: Diagnosis not present

## 2024-02-26 DIAGNOSIS — Z85828 Personal history of other malignant neoplasm of skin: Secondary | ICD-10-CM | POA: Diagnosis not present

## 2024-02-26 DIAGNOSIS — L282 Other prurigo: Secondary | ICD-10-CM | POA: Diagnosis not present

## 2024-02-26 DIAGNOSIS — L817 Pigmented purpuric dermatosis: Secondary | ICD-10-CM | POA: Diagnosis not present

## 2024-02-26 DIAGNOSIS — D1801 Hemangioma of skin and subcutaneous tissue: Secondary | ICD-10-CM | POA: Diagnosis not present

## 2024-02-29 ENCOUNTER — Other Ambulatory Visit: Payer: Self-pay | Admitting: Cardiovascular Disease

## 2024-03-15 DIAGNOSIS — Z85828 Personal history of other malignant neoplasm of skin: Secondary | ICD-10-CM | POA: Diagnosis not present

## 2024-03-15 DIAGNOSIS — L570 Actinic keratosis: Secondary | ICD-10-CM | POA: Diagnosis not present

## 2024-03-16 ENCOUNTER — Telehealth: Payer: Self-pay

## 2024-03-16 NOTE — Telephone Encounter (Signed)
 Should be seen in person.  Sometimes pinkness can be normal for healing so you need to be evaluated to tell if she does have an infection or not.

## 2024-03-16 NOTE — Telephone Encounter (Signed)
 Copied from CRM [id number]. Topic: General - Other >> Mar 16, 2024 10:38 AM Alfonso ORN wrote: Reason for CRM: [facility] Rn message to pcp:  Pt has skin tear to lower extremity ( nickle sized) was bandaged pt says she slipped and bruised same area. tissue is warm increase pinkness , possibly infected wants clarification on possible rx  call back [phone]

## 2024-03-17 NOTE — Telephone Encounter (Signed)
Spoke with patient and scheduled for tomorrow.

## 2024-03-17 NOTE — Telephone Encounter (Unsigned)
 Copied from CRM [id number]. Topic: General - Other >> Mar 17, 2024  9:03 AM Ahlexyia S wrote: Reason for CRM: Danielle from [facility] called in stating that pt has a skin tear on left lower leg. Pt came in yesterday for a dressing change and the nurse noticed that the tear had increased swelling, redness and tender to the touch. Per pt chart there was a call yesterday from a RN from [facility] and there was a message from provider stating that pt needs to be seen so she can be evaluated. I informed Danielle with this info but I wasn't sure due to when I contacted [facility] I was told to send message. Myles Houston that someone will call her shortly about pt issue and pt being scheduled etc.   Garry Houston; [phone]

## 2024-03-18 ENCOUNTER — Encounter: Payer: Self-pay | Admitting: Internal Medicine

## 2024-03-18 ENCOUNTER — Ambulatory Visit (INDEPENDENT_AMBULATORY_CARE_PROVIDER_SITE_OTHER): Admitting: Internal Medicine

## 2024-03-18 VITALS — BP 114/78 | HR 84 | Temp 98.1°F | Ht 65.0 in | Wt 151.0 lb

## 2024-03-18 DIAGNOSIS — L03116 Cellulitis of left lower limb: Secondary | ICD-10-CM

## 2024-03-18 DIAGNOSIS — L98491 Non-pressure chronic ulcer of skin of other sites limited to breakdown of skin: Secondary | ICD-10-CM

## 2024-03-18 MED ORDER — DOXYCYCLINE HYCLATE 100 MG PO TABS: 100.0000 mg | ORAL_TABLET | Freq: Two times a day (BID) | ORAL | 0 refills | Status: AC

## 2024-03-18 NOTE — Assessment & Plan Note (Signed)
 Acute Skin tear/ulceration of the left lower leg associated with erythema, warmth and tenderness consistent with cellulitis Continue wound care as directed Start doxycycline  100 mg twice daily x 10 days Monitor wound closely-she will have the nurses where she lives, keep an eye on it and changed the wound

## 2024-03-18 NOTE — Assessment & Plan Note (Signed)
 Acute Sustained a skin tear/ulceration 1 week ago in her left anterior lower leg Ulcerations have healthy appearing granulation tissue without active discharge, but surrounding area is erythematous, warm and tender concerning for possible cellulitis which we will treat with doxycycline  Advised wound care with antibacterial ointment, nonstick pad and wrapping it with Coban-advised not to wrap tightly Elevate leg when sitting Monitor closely Follow-up as needed Will have the nurse where she is changing the bandage every other day

## 2024-03-18 NOTE — Progress Notes (Signed)
 ]   Subjective:    Patient ID: [name], female    DOB: [birth year], 89 y.o.   MRN: [id number]      HPI [name] is here for  Chief Complaint  Patient presents with   skin tear    Left leg skin tear   Discussed the use of AI scribe software for clinical note transcription with the patient, who gave verbal consent to proceed.  History of Present Illness [name] is an 89 year old female who presents with skin tears on her lower extremity.  She sustained skin tears on her lower extremity after her Kindle slid off the desk and impacted her leg. The skin in the area is very thin, leading to tears. The injury occurred a week and a half ago, initially presenting with significant redness that has since improved slightly.  The wound is painful primarily when she starts walking, though she describes it as manageable. No fever or chills have been noted. The redness persists but is not worsening. She has not been performing wound care herself; a nurse has been assisting. Initially, the bandage was too tight, causing pain, but it was later adjusted for comfort.  She has been washing the wound with soap and water  and applying an antibacterial liquid every other day. There has been minimal discharge and no significant bleeding after the first couple of days.  She finds it challenging to keep the leg elevated during activities such as playing bridge, although she uses a step stool to elevate her leg slightly while sitting at her desk. She remains active, running errands, which results in some aching and swelling by the end of the day.  She is not taking any specific medication for the wound but has a history of being unable to use sulfa or penicillin.        Medications and allergies reviewed with patient and updated if appropriate.  Current Outpatient Medications on File Prior to Visit  Medication Sig Dispense Refill   acetaminophen  (TYLENOL ) 500 MG tablet Take 500-1,000 mg  by mouth every 6 (six) hours as needed for mild pain or moderate pain.      alendronate  (FOSAMAX ) 70 MG tablet Take 1 tablet (70 mg total) by mouth every 7 (seven) days. Take with a full glass of water  on an empty stomach. 12 tablet 3   ALPRAZolam  (XANAX ) 0.5 MG tablet TAKE 1 TABLET by mouth TWICE DAILY AS NEEDED FOR ANXIETY, DO NOT TAKE AT NIGHT. 180 tablet 0   Calcium  Carb-Cholecalciferol 600-400 MG-UNIT TABS Take 1 tablet by mouth daily.     Carbinoxamine  Maleate 4 MG TABS 1/2-1 tab every 8-12 hours (2-3 times per day) 30 tablet 1   Chloral Hydrate CRYS Take 10 mls by mouth at bedtime as needed 900 g 4   Cholecalciferol (VITAMIN D -3) 1000 UNITS CAPS Take 1,000 Units by mouth daily.      clindamycin  (CLEOCIN ) 300 MG capsule Per patient taking prior to dental appointment     dapagliflozin  propanediol (FARXIGA ) 10 MG TABS tablet Take 1 tablet (10 mg total) by mouth every other day. 45 tablet 2   ELIQUIS  5 MG TABS tablet TAKE ONE TABLET BY MOUTH TWICE DAILY 60 tablet 5   ferrous sulfate 325 (65 FE) MG tablet Take 325 mg by mouth 2 (two) times a week. Every Monday and Wednesday     furosemide  (LASIX ) 20 MG tablet Take 1 tablet by mouth daily. If greater than 3 pound gain in 24  hours, increase to 2 tablets daily until weight returns to normal 120 tablet 3   ipratropium (ATROVENT ) 0.06 % nasal spray USE TWO SPRAYS IN EACH NOSTRIL EVERY 6 HOURS TO DRY NOSE. 45 mL 2   levocetirizine (XYZAL ) 5 MG tablet Take 1 tablet (5 mg total) by mouth daily as needed (Can take an extra dose during flare ups). 180 tablet 1   Magnesium  Oxide 250 MG TABS Take by mouth.     metoprolol  succinate (TOPROL -XL) 25 MG 24 hr tablet Take 1 tablet (25 mg total) by mouth daily. 90 tablet 3   nitroGLYCERIN  (NITROSTAT ) 0.4 MG SL tablet Dissolve 1 tablet under the tongue every 5 minutes as needed for chest pain. Max of 3 doses, then 911. 25 tablet 3   Olopatadine-Mometasone  (RYALTRIS ) 665-25 MCG/ACT SUSP Place 2 sprays into the nose  every morning. 87 g 3   pantoprazole  (PROTONIX ) 40 MG tablet TAKE ONE TABLET BY MOUTH TWICE DAILY 180 tablet 3   Polyethyl Glycol-Propyl Glycol (SYSTANE ULTRA) 0.4-0.3 % SOLN Place 1 drop into both eyes daily as needed (dry eyes).     rosuvastatin  (CRESTOR ) 20 MG tablet Take 1 tablet (20 mg total) by mouth daily. 90 tablet 3   tretinoin (RETIN-A) 0.05 % cream Apply topically.     [DISCONTINUED] nitroGLYCERIN  (NITROSTAT ) 0.4 MG SL tablet Place 1 tablet (0.4 mg total) under the tongue every 5 (five) minutes as needed for chest pain (CP or SOB). 25 tablet 3   Current Facility-Administered Medications on File Prior to Visit  Medication Dose Route Frequency Provider Last Rate Last Admin   denosumab  (PROLIA ) injection 60 mg  60 mg Subcutaneous Once Geofm Glade PARAS, MD        Review of Systems     Objective:   Vitals:   03/18/24 1514  BP: 114/78  Pulse: 84  Temp: 98.1 F (36.7 C)  SpO2: 96%   BP Readings from Last 3 Encounters:  03/18/24 114/78  12/22/23 112/78  11/04/23 100/60   Wt Readings from Last 3 Encounters:  03/18/24 151 lb (68.5 kg)  12/22/23 156 lb (70.8 kg)  11/04/23 154 lb (69.9 kg)   Body mass index is 25.13 kg/m.    Physical Exam Constitutional:      General: She is not in acute distress.    Appearance: Normal appearance. She is not ill-appearing.  HENT:     Head: Atraumatic.  Musculoskeletal:     Right lower leg: No edema.     Left lower leg: No edema.  Skin:    General: Skin is warm and dry.     Comments: Left lower anterior leg with 2 skin tears-1 approximately 3 cm in diameter and 1 approximately 1-1/2 cm in diameter.  Both have healthy appearing granulation tissue.  There is some generalized erythema wrapping around the ankle at that area but is slightly warm and tender.  No swelling or fluctuations around the ulcers.  No active bleeding or discharge.  Neurological:     Mental Status: She is alert.            Assessment & Plan:    See Problem  List for Assessment and Plan of chronic medical problems.

## 2024-03-18 NOTE — Patient Instructions (Addendum)
      Use anti-bacterial ointment, non-stick pad and wrap loosely.       Medications changes include :   doxycycline       Return if symptoms worsen or fail to improve.

## 2024-03-21 ENCOUNTER — Telehealth: Payer: Self-pay

## 2024-03-21 ENCOUNTER — Other Ambulatory Visit: Payer: Self-pay

## 2024-03-21 ENCOUNTER — Encounter: Payer: Self-pay | Admitting: Internal Medicine

## 2024-03-21 MED ORDER — FLUCONAZOLE 150 MG PO TABS: 150.0000 mg | ORAL_TABLET | Freq: Once | ORAL | 0 refills | Status: AC

## 2024-03-21 NOTE — Telephone Encounter (Signed)
 Copied from CRM [id number]. Topic: Clinical - Medication Question >> Mar 21, 2024  9:53 AM Lonell PEDLAR wrote: Reason for CRM: itching and drainage in vaginal area. Requesting rx for diflucan  pill   Patient is requesting rx be sent to [facility] before 12pm.   C/b Autumn, RN, [phone]

## 2024-03-21 NOTE — Telephone Encounter (Signed)
 Script sent this morning and patient notified via my-chart

## 2024-03-30 DIAGNOSIS — H35033 Hypertensive retinopathy, bilateral: Secondary | ICD-10-CM | POA: Diagnosis not present

## 2024-03-30 DIAGNOSIS — H353132 Nonexudative age-related macular degeneration, bilateral, intermediate dry stage: Secondary | ICD-10-CM | POA: Diagnosis not present

## 2024-03-30 DIAGNOSIS — H35371 Puckering of macula, right eye: Secondary | ICD-10-CM | POA: Diagnosis not present

## 2024-03-30 DIAGNOSIS — H04123 Dry eye syndrome of bilateral lacrimal glands: Secondary | ICD-10-CM | POA: Diagnosis not present

## 2024-03-30 DIAGNOSIS — H43813 Vitreous degeneration, bilateral: Secondary | ICD-10-CM | POA: Diagnosis not present

## 2024-04-04 ENCOUNTER — Other Ambulatory Visit: Payer: Self-pay

## 2024-04-04 MED ORDER — FLUCONAZOLE 150 MG PO TABS: 150.0000 mg | ORAL_TABLET | Freq: Once | ORAL | 0 refills | Status: AC

## 2024-04-05 ENCOUNTER — Telehealth: Payer: Self-pay

## 2024-04-05 NOTE — Telephone Encounter (Signed)
 Spoke with patient today.

## 2024-04-05 NOTE — Telephone Encounter (Signed)
 Copied from CRM [id number]. Topic: General - Other >> Apr 05, 2024 10:59 AM Thersia BROCKS wrote: Reason for CRM: Patient called in regarding a missed call from Carla  Need to be called in the next 20 minutes because she will be out

## 2024-04-08 ENCOUNTER — Ambulatory Visit

## 2024-04-08 VITALS — Ht 65.0 in | Wt 151.0 lb

## 2024-04-08 DIAGNOSIS — Z0000 Encounter for general adult medical examination without abnormal findings: Secondary | ICD-10-CM | POA: Diagnosis not present

## 2024-04-08 NOTE — Progress Notes (Signed)
 Chief Complaint  Patient presents with   Medicare Wellness     Subjective:   [name] is a 89 y.o. female who presents for a Medicare Annual Wellness Visit.  Visit info / Clinical Intake: Medicare Wellness Visit Type:: Subsequent Annual Wellness Visit Persons participating in visit and providing information:: patient Medicare Wellness Visit Mode:: Telephone If telephone:: video declined Since this visit was completed virtually, some vitals may be partially provided or unavailable. Missing vitals are due to the limitations of the virtual format.: Documented vitals are patient reported If Telephone or Video please confirm:: I connected with patient using audio/video enable telemedicine. I verified patient identity with two identifiers, discussed telehealth limitations, and patient agreed to proceed. Patient Location:: Home Provider Location:: Office Interpreter Needed?: No Pre-visit prep was completed: yes AWV questionnaire completed by patient prior to visit?: no Living arrangements:: lives with spouse/significant other; in retirement community [employer] [facility]) Patient's Overall Health Status Rating: good Typical amount of pain: none Does pain affect daily life?: no Are you currently prescribed opioids?: no  Dietary Habits and Nutritional Risks How many meals a day?: 3 Eats fruit and vegetables daily?: yes Most meals are obtained by: preparing own meals; eating out In the last 2 weeks, have you had any of the following?: none Diabetic:: no  Functional Status Activities of Daily Living (to include ambulation/medication): Independent Ambulation: Independent with device- listed below Home Assistive Devices/Equipment: Eyeglasses; Dentures (specify type) Medication Administration: Independent Home Management (perform basic housework or laundry): Independent Manage your own finances?: yes Primary transportation is: driving Concerns about vision?: no *vision  screening is required for [facility]* Concerns about hearing?: (!) yes Uses hearing aids?: (!) yes Hear whispered voice?: yes  Fall Screening Falls in the past year?: 0 Number of falls in past year: 0 Was there an injury with Fall?: 0 Fall Risk Category Calculator: 0 Patient Fall Risk Level: Low Fall Risk  Fall Risk Patient at Risk for Falls Due to: No Fall Risks Fall risk Follow up: Falls evaluation completed; Falls prevention discussed  Home and Transportation Safety: All rugs have non-skid backing?: N/A, no rugs All stairs or steps have railings?: N/A, no stairs Grab bars in the bathtub or shower?: yes Have non-skid surface in bathtub or shower?: yes Good home lighting?: yes Regular seat belt use?: yes Hospital stays in the last year:: no  Cognitive Assessment Difficulty concentrating, remembering, or making decisions? : no Will 6CIT or Mini Cog be Completed: yes What year is it?: 0 points What month is it?: 0 points Give patient an address phrase to remember (5 components): [address] [place], [place] About what time is it?: 0 points Count backwards from 20 to 1: 0 points Say the months of the year in reverse: 0 points Repeat the address phrase from earlier: 0 points 6 CIT Score: 0 points  Advance Directives (For Healthcare) Does Patient Have a Medical Advance Directive?: Yes Does patient want to make changes to medical advance directive?: Yes (Inpatient - patient requests chaplain consult to change a medical advance directive) Type of Advance Directive: Healthcare Power of [place]; Living will Copy of Healthcare Power of Attorney in Chart?: No - copy requested Copy of Living Will in Chart?: No - copy requested  Reviewed/Updated  Reviewed/Updated: Reviewed All (Medical, Surgical, Family, Medications, Allergies, Care Teams, Patient Goals)    Allergies (verified) Covid-19 (mrna) vaccine, Lactose intolerance (gi), Amoxicillin-pot clavulanate, Amoxicillin-pot clavulanate,  Penicillin g benzathine, Penicillins, Sulfa antibiotics, and Sulfonamide derivatives   Current Medications (  verified) Outpatient Encounter Medications as of 04/08/2024  Medication Sig   alendronate  (FOSAMAX ) 70 MG tablet Take 1 tablet (70 mg total) by mouth every 7 (seven) days. Take with a full glass of water  on an empty stomach.   ALPRAZolam  (XANAX ) 0.5 MG tablet TAKE 1 TABLET by mouth TWICE DAILY AS NEEDED FOR ANXIETY, DO NOT TAKE AT NIGHT.   Calcium  Carb-Cholecalciferol 600-400 MG-UNIT TABS Take 1 tablet by mouth daily.   Carbinoxamine  Maleate 4 MG TABS 1/2-1 tab every 8-12 hours (2-3 times per day)   Chloral Hydrate CRYS Take 10 mls by mouth at bedtime as needed   Cholecalciferol (VITAMIN D -3) 1000 UNITS CAPS Take 1,000 Units by mouth daily.    clindamycin  (CLEOCIN ) 300 MG capsule Per patient taking prior to dental appointment   dapagliflozin  propanediol (FARXIGA ) 10 MG TABS tablet Take 1 tablet (10 mg total) by mouth every other day.   ELIQUIS  5 MG TABS tablet TAKE ONE TABLET BY MOUTH TWICE DAILY   ferrous sulfate 325 (65 FE) MG tablet Take 325 mg by mouth 2 (two) times a week. Every Monday and Wednesday   furosemide  (LASIX ) 20 MG tablet Take 1 tablet by mouth daily. If greater than 3 pound gain in 24 hours, increase to 2 tablets daily until weight returns to normal   ipratropium (ATROVENT ) 0.06 % nasal spray USE TWO SPRAYS IN EACH NOSTRIL EVERY 6 HOURS TO DRY NOSE.   levocetirizine (XYZAL ) 5 MG tablet Take 1 tablet (5 mg total) by mouth daily as needed (Can take an extra dose during flare ups).   Magnesium  Oxide 250 MG TABS Take by mouth.   metoprolol  succinate (TOPROL -XL) 25 MG 24 hr tablet Take 1 tablet (25 mg total) by mouth daily.   nitroGLYCERIN  (NITROSTAT ) 0.4 MG SL tablet Dissolve 1 tablet under the tongue every 5 minutes as needed for chest pain. Max of 3 doses, then 911.   Olopatadine-Mometasone  (RYALTRIS ) 665-25 MCG/ACT SUSP Place 2 sprays into the nose every morning.    pantoprazole  (PROTONIX ) 40 MG tablet TAKE ONE TABLET BY MOUTH TWICE DAILY   Polyethyl Glycol-Propyl Glycol (SYSTANE ULTRA) 0.4-0.3 % SOLN Place 1 drop into both eyes daily as needed (dry eyes).   rosuvastatin  (CRESTOR ) 20 MG tablet Take 1 tablet (20 mg total) by mouth daily.   tretinoin (RETIN-A) 0.05 % cream Apply topically.   acetaminophen  (TYLENOL ) 500 MG tablet Take 500-1,000 mg by mouth every 6 (six) hours as needed for mild pain or moderate pain.    [DISCONTINUED] nitroGLYCERIN  (NITROSTAT ) 0.4 MG SL tablet Place 1 tablet (0.4 mg total) under the tongue every 5 (five) minutes as needed for chest pain (CP or SOB).   Facility-Administered Encounter Medications as of 04/08/2024  Medication   denosumab  (PROLIA ) injection 60 mg    History: Past Medical History:  Diagnosis Date   Allergy    Anemia    Anxiety    Aortic valve stenosis, severe    s/p AVR // mild to mod paravalvular leakage // Echo 9/21: EF 55-60, no RWMA, mild LVH, GR 2 DD, mildly reduced RVSF, RVSP 38.7, moderate LAE, mild RAE, AVR with mean gradient 11 mmHg, mild paravalvular leakage   Arthritis    Breast cancer (HCC) 2007   right   Cataract    Constipation    Dysrhythmia    A-fib   Hiatal hernia    pt denies   Hypercholesteremia    Hypertension    Hypoglycemia    Osteopenia    Personal  history of radiation therapy    Tingling    Ulcer 2012   bleeding gastric Ulcer   Past Surgical History:  Procedure Laterality Date   AORTIC VALVE REPLACEMENT N/A 11/16/2014   Procedure: AORTIC VALVE REPLACEMENT (AVR);  Surgeon: Dorise MARLA Fellers, MD;  Location[facility] OR;  Service: Open Heart Surgery;  Laterality: N/A;   BIOPSY  12/15/2017   Procedure: BIOPSY;  Surgeon: Wilhelmenia Aloha Raddle., MD;  Location: [facility] ENDOSCOPY;  Service: Gastroenterology;;   BIOPSY  01/10/2021   Procedure: BIOPSY;  Surgeon: Wilhelmenia Aloha Raddle., MD;  Location: [facility] ENDOSCOPY;  Service: Gastroenterology;;   BREAST BIOPSY     BREAST LUMPECTOMY Right     2005?   BREAST SURGERY     BRONCHIAL BIOPSY  03/06/2020   Procedure: BRONCHIAL BIOPSIES;  Surgeon: Shelah Lamar RAMAN, MD;  Location: [facility] ENDOSCOPY;  Service: Pulmonary;;   BRONCHIAL BRUSHINGS  03/06/2020   Procedure: BRONCHIAL BRUSHINGS;  Surgeon: Shelah Lamar RAMAN, MD;  Location: [facility] ENDOSCOPY;  Service: Pulmonary;;   BRONCHIAL NEEDLE ASPIRATION BIOPSY  03/06/2020   Procedure: BRONCHIAL NEEDLE ASPIRATION BIOPSIES;  Surgeon: Shelah Lamar RAMAN, MD;  Location: [facility] ENDOSCOPY;  Service: Pulmonary;;   BRONCHIAL WASHINGS  03/06/2020   Procedure: BRONCHIAL WASHINGS;  Surgeon: Shelah Lamar RAMAN, MD;  Location: [facility] ENDOSCOPY;  Service: Pulmonary;;   CARDIAC CATHETERIZATION N/A 10/18/2014   Procedure: Right/Left Heart Cath and Coronary Angiography;  Surgeon: Ozell Fell, MD;  Location: [facility] INVASIVE CV LAB;  Service: Cardiovascular;  Laterality: N/A;   CARDIOVERSION N/A 12/09/2019   Procedure: CARDIOVERSION;  Surgeon: Delford Maude BROCKS, MD;  Location: [facility] ENDOSCOPY;  Service: Cardiovascular;  Laterality: N/A;   CARDIOVERSION N/A 12/30/2019   Procedure: CARDIOVERSION;  Surgeon: Delford Maude BROCKS, MD;  Location: [facility] ENDOSCOPY;  Service: Cardiovascular;  Laterality: N/A;   COLONOSCOPY WITH PROPOFOL  N/A 01/10/2021   Procedure: COLONOSCOPY WITH PROPOFOL ;  Surgeon: Wilhelmenia Aloha Raddle., MD;  Location: [facility] ENDOSCOPY;  Service: Gastroenterology;  Laterality: N/A;   CORONARY ANGIOGRAPHY N/A 02/04/2019   Procedure: CORONARY ANGIOGRAPHY;  Surgeon: Mady Bruckner, MD;  Location: [facility] INVASIVE CV LAB;  Service: Cardiovascular;  Laterality: N/A;   COSMETIC SURGERY     face   ESOPHAGOGASTRODUODENOSCOPY (EGD) WITH PROPOFOL  N/A 12/15/2017   Procedure: ESOPHAGOGASTRODUODENOSCOPY (EGD) WITH PROPOFOL ;  Surgeon: Wilhelmenia Aloha Raddle., MD;  Location[facility] ENDOSCOPY;  Service: Gastroenterology;  Laterality: N/A;   FIDUCIAL MARKER PLACEMENT  03/06/2020   Procedure: FIDUCIAL MARKER PLACEMENT;  Surgeon: Shelah Lamar RAMAN, MD;  Location: [facility] ENDOSCOPY;   Service: Pulmonary;;   PARATHYROIDECTOMY     TEE WITHOUT CARDIOVERSION N/A 11/16/2014   Procedure: TRANSESOPHAGEAL ECHOCARDIOGRAM (TEE);  Surgeon: Dorise MARLA Fellers, MD;  Location: [facility] OR;  Service: Open Heart Surgery;  Laterality: N/A;   TEE WITHOUT CARDIOVERSION N/A 12/09/2019   Procedure: TRANSESOPHAGEAL ECHOCARDIOGRAM (TEE);  Surgeon: Delford Maude BROCKS, MD;  Location: [facility] ENDOSCOPY;  Service: Cardiovascular;  Laterality: N/A;   TONSILLECTOMY     VIDEO BRONCHOSCOPY WITH ENDOBRONCHIAL NAVIGATION Right 03/06/2020   Procedure: VIDEO BRONCHOSCOPY WITH ENDOBRONCHIAL NAVIGATION;  Surgeon: Shelah Lamar RAMAN, MD;  Location: [facility] ENDOSCOPY;  Service: Pulmonary;  Laterality: Right;   Family History  Problem Relation Age of Onset   Hypertension Mother    Rectal cancer Mother    Colon cancer Mother    Kidney disease Father    Cancer Cousin 73       breast and ovarian    Esophageal cancer Neg Hx    Stomach cancer Neg Hx    Colon polyps  Neg Hx    Social History   Occupational History   Occupation: Retired  Tobacco Use   Smoking status: Former    Current packs/day: 0.00    Average packs/day: 0.5 packs/day for 15.0 years (7.5 ttl pk-yrs)    Types: Cigarettes    Start date: 30    Quit date: 1986    Years since quitting: 39.9   Smokeless tobacco: Never   Tobacco comments:    quit smoking 1980  Vaping Use   Vaping status: Never Used  Substance and Sexual Activity   Alcohol use: Yes    Alcohol/week: 7.0 standard drinks of alcohol    Types: 7 Glasses of wine per week    Comment: wine nightly with dinner   Drug use: No   Sexual activity: Not Currently   Tobacco Counseling Counseling given: Yes Tobacco comments: quit smoking 1980  SDOH Screenings   Food Insecurity: No Food Insecurity (04/08/2024)  Housing: Unknown (04/08/2024)  Transportation Needs: No Transportation Needs (04/08/2024)  Utilities: Not At Risk (04/08/2024)  Alcohol Screen: Low Risk (04/03/2023)  Depression (PHQ2-9): Low Risk  (04/08/2024)  Financial Resource Strain: Low Risk (04/03/2023)  Physical Activity: Inactive (04/08/2024)  Social Connections: Socially Integrated (04/08/2024)  Stress: No Stress Concern Present (04/08/2024)  Tobacco Use: Medium Risk (04/08/2024)  Health Literacy: Adequate Health Literacy (04/08/2024)   See flowsheets for full screening details  Depression Screen PHQ 2 & 9 Depression Scale- Over the past 2 weeks, how often have you been bothered by any of the following problems? Little interest or pleasure in doing things: 0 Feeling down, depressed, or hopeless (PHQ Adolescent also includes...irritable): 0 PHQ-2 Total Score: 0 Trouble falling or staying asleep, or sleeping too much: 0 Feeling tired or having little energy: 0 Poor appetite or overeating (PHQ Adolescent also includes...weight loss): 0 Feeling bad about yourself - or that you are a failure or have let yourself or your family down: 0 Trouble concentrating on things, such as reading the newspaper or watching television (PHQ Adolescent also includes...like school work): 0 Moving or speaking so slowly that other people could have noticed. Or the opposite - being so fidgety or restless that you have been moving around a lot more than usual: 0 Thoughts that you would be better off dead, or of hurting yourself in some way: 0 PHQ-9 Total Score: 0 If you checked off any problems, how difficult have these problems made it for you to do your work, take care of things at home, or get along with other people?: Not difficult at all  Depression Treatment Depression Interventions/Treatment : EYV7-0 Score <4 Follow-up Not Indicated     Goals Addressed               This Visit's Progress     Patient Stated (pt-stated)        Patient stated she plans to walk more and manage diet             Objective:    Today's Vitals   04/08/24 1140  Weight: 151 lb (68.5 kg)  Height: 5' 5 (1.651 m)   Body mass index is 25.13  kg/m.  Hearing/Vision screen Hearing Screening - Comments:: Wears hearing aids Vision Screening - Comments:: Wears rx glasses - up to date with routine eye exams with Camellia Baker Immunizations and Health Maintenance Health Maintenance  Topic Date Due   COVID-19 Vaccine (9 - 2025-26 season) 12/28/2023   Medicare Annual Wellness (AWV)  04/08/2025   Bone Density Scan  12/07/2025   DTaP/Tdap/Td (3 - Td or Tdap) 09/30/2026   Pneumococcal Vaccine: 50+ Years  Completed   Influenza Vaccine  Completed   Zoster Vaccines- Shingrix  Completed   Meningococcal B Vaccine  Aged Out   Mammogram  Discontinued   Colonoscopy  Discontinued        Assessment/Plan:  This is a routine wellness examination for Cornelius.  Patient Care Team: Geofm Glade PARAS, MD as PCP - General (Internal Medicine) Wonda Sharper, MD as PCP - Cardiology (Cardiology) Pyrtle, Gordy HERO, MD as Consulting Physician (Gastroenterology) Wonda Sharper, MD as Consulting Physician (Cardiology) Odean Potts, MD as Consulting Physician (Hematology and Oncology) Onita Duos, MD as Consulting Physician (Neurology) Mansouraty, Aloha Raddle., MD as Consulting Physician (Gastroenterology) Fate Morna SAILOR, [facility] (Inactive) as Pharmacist (Pharmacist) Geofm Glade PARAS, MD as Consulting Physician (Internal Medicine) Charmayne Molly, MD as Consulting Physician (Ophthalmology) Nonah Camellia ORN, MD as Consulting Physician (Optometry)  I have personally reviewed and noted the following in the patients chart:   Medical and social history Use of alcohol, tobacco or illicit drugs  Current medications and supplements including opioid prescriptions. Functional ability and status Nutritional status Physical activity Advanced directives List of other physicians Hospitalizations, surgeries, and ER visits in previous 12 months Vitals Screenings to include cognitive, depression, and falls Referrals and appointments  No orders of the defined types were  placed in this encounter.  In addition, I have reviewed and discussed with patient certain preventive protocols, quality metrics, and best practice recommendations. A written personalized care plan for preventive services as well as general preventive health recommendations were provided to patient.   Verdie HERO Saba, CMA   04/08/2024   Return in 1 year (on 04/08/2025).  After Visit Summary: (MyChart) Due to this being a telephonic visit, the after visit summary with patients personalized plan was offered to patient via MyChart   Nurse Notes: Appointment(s) made: (scheduled 6-mth f/u w/PCP; scheduled 2026 AWV/CPE appts)

## 2024-04-08 NOTE — Patient Instructions (Addendum)
 [name],  Thank you for taking the time for your Medicare Wellness Visit. I appreciate your continued commitment to your health goals. Please review the care plan we discussed, and feel free to reach out if I can assist you further.  Please note that Annual Wellness Visits do not include a physical exam. Some assessments may be limited, especially if the visit was conducted virtually. If needed, we may recommend an in-person follow-up with your provider.  Ongoing Care Seeing your primary care provider every 3 to 6 months helps us  monitor your health and provide consistent, personalized care.   Referrals If a referral was made during today's visit and you haven't received any updates within two weeks, please contact the referred provider directly to check on the status.  Recommended Screenings:  Health Maintenance  Topic Date Due   COVID-19 Vaccine (9 - 2025-26 season) 12/28/2023   Medicare Annual Wellness Visit  04/08/2025   Osteoporosis screening with Bone Density Scan  12/07/2025   DTaP/Tdap/Td vaccine (3 - Td or Tdap) 09/30/2026   Pneumococcal Vaccine for age over 32  Completed   Flu Shot  Completed   Zoster (Shingles) Vaccine  Completed   Meningitis B Vaccine  Aged Out   Breast Cancer Screening  Discontinued   Colon Cancer Screening  Discontinued       04/08/2024   11:42 AM  Advanced Directives  Does Patient Have a Medical Advance Directive? Yes  Type of Estate Agent of [place];Living will  Does patient want to make changes to medical advance directive? Yes (Inpatient - patient requests chaplain consult to change a medical advance directive)  Copy of Healthcare Power of Attorney in Chart? No - copy requested    Vision: Annual vision screenings are recommended for early detection of glaucoma, cataracts, and diabetic retinopathy. These exams can also reveal signs of chronic conditions such as diabetes and high blood pressure.  Dental: Annual  dental screenings help detect early signs of oral cancer, gum disease, and other conditions linked to overall health, including heart disease and diabetes.

## 2024-04-28 ENCOUNTER — Other Ambulatory Visit: Payer: Self-pay | Admitting: Internal Medicine

## 2024-05-09 ENCOUNTER — Encounter: Payer: Self-pay | Admitting: Internal Medicine

## 2024-05-09 ENCOUNTER — Telehealth: Payer: Self-pay

## 2024-05-09 MED ORDER — HYDROCODONE BIT-HOMATROP MBR 5-1.5 MG/5ML PO SOLN: 5.0000 mL | Freq: Three times a day (TID) | ORAL | 0 refills | Status: AC | PRN

## 2024-05-09 NOTE — Telephone Encounter (Signed)
 Cough syrup sent in

## 2024-05-09 NOTE — Telephone Encounter (Signed)
 Copied from CRM [id number]. Topic: Clinical - Medication Question >> May 09, 2024  3:53 PM Winona R wrote: Pt calling to follow up with following message sent via mychart   [facility] have Dr. Geofm send in a refill of hydrocodone -homatropine soln Qty 120. I have a bd cough and need it at night to sleep. It is perscription  number [id number]     Call it in to [facility] so they can send it out today.     THANKS.       [name]    No fever

## 2024-05-09 NOTE — Addendum Note (Signed)
 Addended by: GEOFM GLADE PARAS on: 05/09/2024 04:33 PM   Modules accepted: Orders

## 2024-06-20 ENCOUNTER — Other Ambulatory Visit (HOSPITAL_BASED_OUTPATIENT_CLINIC_OR_DEPARTMENT_OTHER)

## 2024-07-01 ENCOUNTER — Other Ambulatory Visit (HOSPITAL_BASED_OUTPATIENT_CLINIC_OR_DEPARTMENT_OTHER)

## 2024-07-11 ENCOUNTER — Ambulatory Visit: Admitting: Radiation Oncology

## 2024-09-16 ENCOUNTER — Ambulatory Visit: Admitting: Internal Medicine

## 2024-10-24 ENCOUNTER — Ambulatory Visit: Admitting: Nurse Practitioner

## 2024-12-20 ENCOUNTER — Ambulatory Visit: Admitting: Allergy and Immunology

## 2025-04-12 ENCOUNTER — Ambulatory Visit

## 2025-04-17 ENCOUNTER — Ambulatory Visit

## 2025-04-17 ENCOUNTER — Encounter: Admitting: Internal Medicine
